# Patient Record
Sex: Male | Born: 1960 | Race: White | Hispanic: No | Marital: Single | Smoking: Former smoker
Health system: Southern US, Community
[De-identification: ages and names within clinical notes are randomized; demographics above are authoritative.]

## PROBLEM LIST (undated history)

## (undated) DIAGNOSIS — I2510 Atherosclerotic heart disease of native coronary artery without angina pectoris: Secondary | ICD-10-CM

## (undated) DIAGNOSIS — M1990 Unspecified osteoarthritis, unspecified site: Secondary | ICD-10-CM

## (undated) DIAGNOSIS — G4733 Obstructive sleep apnea (adult) (pediatric): Secondary | ICD-10-CM

## (undated) DIAGNOSIS — M549 Dorsalgia, unspecified: Secondary | ICD-10-CM

## (undated) DIAGNOSIS — E119 Type 2 diabetes mellitus without complications: Secondary | ICD-10-CM

## (undated) DIAGNOSIS — F419 Anxiety disorder, unspecified: Secondary | ICD-10-CM

## (undated) DIAGNOSIS — F329 Major depressive disorder, single episode, unspecified: Secondary | ICD-10-CM

## (undated) DIAGNOSIS — G4730 Sleep apnea, unspecified: Secondary | ICD-10-CM

## (undated) DIAGNOSIS — G8929 Other chronic pain: Secondary | ICD-10-CM

## (undated) DIAGNOSIS — I10 Essential (primary) hypertension: Secondary | ICD-10-CM

## (undated) DIAGNOSIS — R0600 Dyspnea, unspecified: Secondary | ICD-10-CM

## (undated) DIAGNOSIS — F1020 Alcohol dependence, uncomplicated: Secondary | ICD-10-CM

## (undated) DIAGNOSIS — Z9861 Coronary angioplasty status: Secondary | ICD-10-CM

## (undated) DIAGNOSIS — E785 Hyperlipidemia, unspecified: Secondary | ICD-10-CM

## (undated) DIAGNOSIS — J189 Pneumonia, unspecified organism: Secondary | ICD-10-CM

## (undated) DIAGNOSIS — I214 Non-ST elevation (NSTEMI) myocardial infarction: Secondary | ICD-10-CM

## (undated) DIAGNOSIS — J45909 Unspecified asthma, uncomplicated: Secondary | ICD-10-CM

## (undated) DIAGNOSIS — K219 Gastro-esophageal reflux disease without esophagitis: Secondary | ICD-10-CM

## (undated) DIAGNOSIS — J449 Chronic obstructive pulmonary disease, unspecified: Secondary | ICD-10-CM

## (undated) DIAGNOSIS — F32A Depression, unspecified: Secondary | ICD-10-CM

## (undated) DIAGNOSIS — Z9989 Dependence on other enabling machines and devices: Secondary | ICD-10-CM

## (undated) DIAGNOSIS — G43909 Migraine, unspecified, not intractable, without status migrainosus: Secondary | ICD-10-CM

## (undated) HISTORY — DX: Alcohol dependence, uncomplicated: F10.20

## (undated) HISTORY — DX: Anxiety disorder, unspecified: F41.9

## (undated) HISTORY — DX: Sleep apnea, unspecified: G47.30

## (undated) HISTORY — PX: MULTIPLE TOOTH EXTRACTIONS: SHX2053

## (undated) HISTORY — DX: Unspecified asthma, uncomplicated: J45.909

## (undated) HISTORY — PX: CORONARY ANGIOPLASTY WITH STENT PLACEMENT: SHX49

---

## 1997-12-19 ENCOUNTER — Encounter: Admission: RE | Admit: 1997-12-19 | Discharge: 1997-12-19 | Payer: Self-pay | Admitting: Family Medicine

## 1998-03-11 ENCOUNTER — Encounter: Admission: RE | Admit: 1998-03-11 | Discharge: 1998-03-11 | Payer: Self-pay | Admitting: Family Medicine

## 1998-06-19 ENCOUNTER — Encounter: Admission: RE | Admit: 1998-06-19 | Discharge: 1998-06-19 | Payer: Self-pay | Admitting: Family Medicine

## 1999-11-21 ENCOUNTER — Emergency Department (HOSPITAL_COMMUNITY): Admission: EM | Admit: 1999-11-21 | Discharge: 1999-11-21 | Payer: Self-pay | Admitting: Emergency Medicine

## 1999-11-21 ENCOUNTER — Encounter: Payer: Self-pay | Admitting: Emergency Medicine

## 1999-11-27 ENCOUNTER — Emergency Department (HOSPITAL_COMMUNITY): Admission: EM | Admit: 1999-11-27 | Discharge: 1999-11-27 | Payer: Self-pay | Admitting: Podiatry

## 1999-12-06 ENCOUNTER — Emergency Department (HOSPITAL_COMMUNITY): Admission: EM | Admit: 1999-12-06 | Discharge: 1999-12-06 | Payer: Self-pay | Admitting: Emergency Medicine

## 2007-11-23 ENCOUNTER — Emergency Department (HOSPITAL_COMMUNITY): Admission: EM | Admit: 2007-11-23 | Discharge: 2007-11-23 | Payer: Self-pay | Admitting: Emergency Medicine

## 2008-02-07 ENCOUNTER — Emergency Department (HOSPITAL_COMMUNITY): Admission: EM | Admit: 2008-02-07 | Discharge: 2008-02-08 | Payer: Self-pay | Admitting: Emergency Medicine

## 2008-03-12 ENCOUNTER — Emergency Department (HOSPITAL_COMMUNITY): Admission: EM | Admit: 2008-03-12 | Discharge: 2008-03-12 | Payer: Self-pay | Admitting: Emergency Medicine

## 2008-03-28 ENCOUNTER — Emergency Department (HOSPITAL_COMMUNITY): Admission: EM | Admit: 2008-03-28 | Discharge: 2008-03-28 | Payer: Self-pay | Admitting: Emergency Medicine

## 2008-06-26 ENCOUNTER — Ambulatory Visit: Payer: Self-pay | Admitting: Family Medicine

## 2008-07-03 ENCOUNTER — Ambulatory Visit: Payer: Self-pay | Admitting: *Deleted

## 2008-08-12 ENCOUNTER — Ambulatory Visit: Payer: Self-pay | Admitting: Internal Medicine

## 2008-08-12 LAB — CONVERTED CEMR LAB
ALT: 20 units/L (ref 0–53)
AST: 12 units/L (ref 0–37)
Albumin: 4.2 g/dL (ref 3.5–5.2)
Alkaline Phosphatase: 127 units/L — ABNORMAL HIGH (ref 39–117)
BUN: 12 mg/dL (ref 6–23)
Basophils Absolute: 0.1 10*3/uL (ref 0.0–0.1)
Basophils Relative: 1 % (ref 0–1)
CO2: 21 meq/L (ref 19–32)
Calcium: 9.8 mg/dL (ref 8.4–10.5)
Chloride: 99 meq/L (ref 96–112)
Cholesterol: 194 mg/dL (ref 0–200)
Creatinine, Ser: 0.77 mg/dL (ref 0.40–1.50)
Eosinophils Absolute: 0.3 10*3/uL (ref 0.0–0.7)
Eosinophils Relative: 2 % (ref 0–5)
Glucose, Bld: 287 mg/dL — ABNORMAL HIGH (ref 70–99)
HCT: 46.9 % (ref 39.0–52.0)
HDL: 39 mg/dL — ABNORMAL LOW (ref >39)
Hemoglobin: 16.1 g/dL (ref 13.0–17.0)
LDL Cholesterol: 112 mg/dL — ABNORMAL HIGH (ref 0–99)
Lymphocytes Relative: 24 % (ref 12–46)
Lymphs Abs: 3 10*3/uL (ref 0.7–4.0)
MCHC: 34.3 g/dL (ref 30.0–36.0)
MCV: 90 fL (ref 78.0–100.0)
Monocytes Absolute: 0.8 10*3/uL (ref 0.1–1.0)
Monocytes Relative: 6 % (ref 3–12)
Neutro Abs: 8.4 10*3/uL — ABNORMAL HIGH (ref 1.7–7.7)
Neutrophils Relative %: 67 % (ref 43–77)
Platelets: 325 10*3/uL (ref 150–400)
Potassium: 4.2 meq/L (ref 3.5–5.3)
Pro B Natriuretic peptide (BNP): 9 pg/mL (ref 0.0–100.0)
RBC: 5.21 M/uL (ref 4.22–5.81)
RDW: 12.5 % (ref 11.5–15.5)
Sodium: 135 meq/L (ref 135–145)
Total Bilirubin: 0.4 mg/dL (ref 0.3–1.2)
Total CHOL/HDL Ratio: 5
Total Protein: 7.2 g/dL (ref 6.0–8.3)
Triglycerides: 215 mg/dL — ABNORMAL HIGH (ref <150)
VLDL: 43 mg/dL — ABNORMAL HIGH (ref 0–40)
WBC: 12.5 10*3/uL — ABNORMAL HIGH (ref 4.0–10.5)

## 2008-08-20 ENCOUNTER — Emergency Department (HOSPITAL_COMMUNITY): Admission: EM | Admit: 2008-08-20 | Discharge: 2008-08-20 | Payer: Self-pay | Admitting: Emergency Medicine

## 2008-08-25 ENCOUNTER — Ambulatory Visit: Payer: Self-pay | Admitting: Internal Medicine

## 2008-08-28 ENCOUNTER — Ambulatory Visit: Payer: Self-pay | Admitting: Family Medicine

## 2008-11-03 ENCOUNTER — Ambulatory Visit: Payer: Self-pay | Admitting: Internal Medicine

## 2008-11-03 LAB — CONVERTED CEMR LAB
ALT: 16 units/L (ref 0–53)
AST: 13 units/L (ref 0–37)
Albumin: 4.2 g/dL (ref 3.5–5.2)
Alkaline Phosphatase: 112 units/L (ref 39–117)
BUN: 12 mg/dL (ref 6–23)
CO2: 19 meq/L (ref 19–32)
Calcium: 9.5 mg/dL (ref 8.4–10.5)
Chloride: 102 meq/L (ref 96–112)
Cholesterol: 165 mg/dL (ref 0–200)
Creatinine, Ser: 0.73 mg/dL (ref 0.40–1.50)
Glucose, Bld: 205 mg/dL — ABNORMAL HIGH (ref 70–99)
HDL: 39 mg/dL — ABNORMAL LOW (ref >39)
LDL Cholesterol: 80 mg/dL (ref 0–99)
Potassium: 4.3 meq/L (ref 3.5–5.3)
Sodium: 138 meq/L (ref 135–145)
Total Bilirubin: 0.2 mg/dL — ABNORMAL LOW (ref 0.3–1.2)
Total CHOL/HDL Ratio: 4.2
Total Protein: 7.2 g/dL (ref 6.0–8.3)
Triglycerides: 229 mg/dL — ABNORMAL HIGH (ref <150)
VLDL: 46 mg/dL — ABNORMAL HIGH (ref 0–40)

## 2008-11-28 ENCOUNTER — Ambulatory Visit: Payer: Self-pay | Admitting: Family Medicine

## 2008-12-04 ENCOUNTER — Ambulatory Visit: Payer: Self-pay | Admitting: Internal Medicine

## 2009-01-05 ENCOUNTER — Ambulatory Visit: Payer: Self-pay | Admitting: Internal Medicine

## 2009-01-31 ENCOUNTER — Ambulatory Visit (HOSPITAL_BASED_OUTPATIENT_CLINIC_OR_DEPARTMENT_OTHER): Admission: RE | Admit: 2009-01-31 | Discharge: 2009-01-31 | Payer: Self-pay | Admitting: Family Medicine

## 2009-02-06 ENCOUNTER — Ambulatory Visit: Payer: Self-pay | Admitting: Internal Medicine

## 2009-03-09 ENCOUNTER — Ambulatory Visit: Payer: Self-pay | Admitting: Internal Medicine

## 2009-03-20 ENCOUNTER — Ambulatory Visit: Payer: Self-pay | Admitting: Internal Medicine

## 2009-04-14 ENCOUNTER — Ambulatory Visit (HOSPITAL_BASED_OUTPATIENT_CLINIC_OR_DEPARTMENT_OTHER): Admission: RE | Admit: 2009-04-14 | Discharge: 2009-04-14 | Payer: Self-pay | Admitting: Family Medicine

## 2009-04-15 ENCOUNTER — Ambulatory Visit: Payer: Self-pay | Admitting: Internal Medicine

## 2009-04-18 ENCOUNTER — Ambulatory Visit: Payer: Self-pay | Admitting: Internal Medicine

## 2009-06-19 ENCOUNTER — Ambulatory Visit: Payer: Self-pay | Admitting: Internal Medicine

## 2009-12-18 ENCOUNTER — Ambulatory Visit: Payer: Self-pay | Admitting: Internal Medicine

## 2010-02-11 ENCOUNTER — Ambulatory Visit: Payer: Self-pay | Admitting: Internal Medicine

## 2010-03-22 ENCOUNTER — Ambulatory Visit: Payer: Self-pay | Admitting: Internal Medicine

## 2010-03-22 LAB — CONVERTED CEMR LAB
BUN: 15 mg/dL (ref 6–23)
CO2: 24 meq/L (ref 19–32)
Calcium: 9.7 mg/dL (ref 8.4–10.5)
Chloride: 99 meq/L (ref 96–112)
Cholesterol: 122 mg/dL (ref 0–200)
Creatinine, Ser: 0.97 mg/dL (ref 0.40–1.50)
Glucose, Bld: 301 mg/dL — ABNORMAL HIGH (ref 70–99)
HDL: 36 mg/dL — ABNORMAL LOW (ref >39)
Hgb A1c MFr Bld: 10.4 % — ABNORMAL HIGH (ref <5.7)
LDL Cholesterol: 56 mg/dL (ref 0–99)
Potassium: 4.4 meq/L (ref 3.5–5.3)
Sodium: 135 meq/L (ref 135–145)
Total CHOL/HDL Ratio: 3.4
Triglycerides: 150 mg/dL — ABNORMAL HIGH (ref <150)
VLDL: 30 mg/dL (ref 0–40)

## 2010-03-29 ENCOUNTER — Ambulatory Visit: Payer: Self-pay | Admitting: Family Medicine

## 2010-05-11 ENCOUNTER — Ambulatory Visit: Payer: Self-pay | Admitting: Internal Medicine

## 2010-05-11 LAB — CONVERTED CEMR LAB
BUN: 10 mg/dL (ref 6–23)
CO2: 24 meq/L (ref 19–32)
Calcium: 9.3 mg/dL (ref 8.4–10.5)
Chloride: 102 meq/L (ref 96–112)
Cholesterol: 127 mg/dL (ref 0–200)
Creatinine, Ser: 0.76 mg/dL (ref 0.40–1.50)
Glucose, Bld: 168 mg/dL — ABNORMAL HIGH (ref 70–99)
HDL: 38 mg/dL — ABNORMAL LOW (ref >39)
Hgb A1c MFr Bld: 9.4 % — ABNORMAL HIGH (ref <5.7)
LDL Cholesterol: 47 mg/dL (ref 0–99)
Potassium: 3.8 meq/L (ref 3.5–5.3)
Sodium: 138 meq/L (ref 135–145)
Total CHOL/HDL Ratio: 3.3
Triglycerides: 210 mg/dL — ABNORMAL HIGH (ref <150)
VLDL: 42 mg/dL — ABNORMAL HIGH (ref 0–40)

## 2010-05-14 ENCOUNTER — Ambulatory Visit: Payer: Self-pay | Admitting: Internal Medicine

## 2010-10-07 ENCOUNTER — Ambulatory Visit: Admit: 2010-10-07 | Payer: Self-pay | Admitting: Internal Medicine

## 2011-01-25 NOTE — Procedures (Signed)
NAME:  [name] [name], [name] [name]                 ACCOUNT NO.:  [id number]   MEDICAL RECORD NO.:  [id number]          PATIENT TYPE:  OUT   LOCATION:  SLEEP CENTER                 FACILITY:  [facility]   PHYSICIAN:  Clinton D. Maple Hudson, MD, FCCP, FACPDATE OF BIRTH:  August 12, 1961   DATE OF STUDY:  04/14/2009                            NOCTURNAL POLYSOMNOGRAM   REFERRING PHYSICIAN:  Sharin Grave, MD   INDICATION FOR STUDY:  Hypersomnia with sleep apnea.   EPWORTH SLEEPINESS SCORE:  Epworth sleepiness score 15/24, BMI 40.3.  Weight 265 pounds, height 68 inches.  Neck 19 inches.   MEDICATIONS:  Home medication charted and reviewed.   A diagnostic NPSG on Jan 31, 2009 recorded an AHI of 85.2 per hour.  CPAP titration is requested.   SLEEP ARCHITECTURE:  Total sleep time 168.5 minutes with sleep  efficiency 42%.  Stage I was 6.2%, stage II 57.3%, stage III 1.2%, REM  35.3% of total sleep time.  Sleep latency 129.5 minutes, REM latency  120.5 minutes, awake after sleep onset 103.5 minutes.  Arousal index  34.5.  Bedtime medication:  Metformin, lisinopril, aspirin.   RESPIRATORY DATA:  CPAP titration protocol.  CPAP was titrated to 16  CWP, AHI 0 per hour.  He wore a medium ResMed Quattro full face mask  with heated humidifier.   OXYGEN DATA:  Moderate snoring before CPAP with oxygen desaturation  controlled and mean oxygen saturation through the study 91.8% wearing  CPAP with room air.   CARDIAC DATA:  Sinus rhythm.   MOVEMENT-PARASOMNIA:  Occasional limb jerk with arousal, insignificant  recognizing the stimulation of CPAP titration.  Bathroom x1.   IMPRESSIONS-RECOMMENDATIONS:  1. Successful CPAP titration to 16 CWP, AHI 0 per hour.  He wore a      medium ResMed Quattro full face mask with heated humidifier.  2. Baseline diagnostic NPSG on Jan 31, 2009, AHI 85.2 per hour.  3. Sleep onset for the present study was shortly before 3:00 a.m.  He      may need a sedative hypnotic to aid with sleep  consolidation during      the first 2 weeks or so of CPAP adjustment at home.      Clinton D. Maple Hudson, MD, [facility], FACP  Diplomate, Biomedical engineer of Sleep Medicine  Electronically Signed     CDY/MEDQ  D:  04/18/2009 11:45:32  T:  04/18/2009 12:44:24  Job:  [id number]

## 2011-01-25 NOTE — Procedures (Signed)
NAME:  [name] [name], [name] [name]                 ACCOUNT NO.:  [id number]   MEDICAL RECORD NO.:  [id number]          PATIENT TYPE:  OUT   LOCATION:  SLEEP CENTER                 FACILITY[facility]   PHYSICIAN:  Clinton D. Maple Hudson, MD, FCCP, FACPDATE OF BIRTH:  [birth year]Aug-1961   DATE OF STUDY:  01/31/2009                            NOCTURNAL POLYSOMNOGRAM   REFERRING PHYSICIAN:  Sharin Grave, MD   INDICATION FOR STUDY:  Hypersomnia with sleep apnea.   EPWORTH SLEEPINESS SCORE:  13/24.  BMI 40.3.  Weight 265 pounds.  Height  68 inches.  Neck 21 inches.   MEDICATIONS:  Home medications are reportedly cholesterol and diabetes  medications, names not known to the patient.   SLEEP ARCHITECTURE:  Total sleep time 262 minutes with sleep efficiency  72%.  Stage I was 6.1%.  Stage II 65.6%.  Stage III absent.  REM 28.2%  of total sleep time.  Sleep latency 68 minutes.  REM latency 93 minutes.  Awake after sleep onset 35 minutes.  Arousal index 62.5 indicating  increased EEG arousal.  No bedtime medication was taken.  Sustained  sleep was not achieved until 11:45 p.m.   RESPIRATORY DATA:  Apnea/hypopnea index (AHI) 85.2 per hour.  A total of  372 events were scored including 77 obstructive apneas and 295  hypopneas.  Events were not positional.  REM AHI 76.2 per hour.  The  technician indicated the patient did not meet cumulative required sleep  time prior to 2:00 a.m. to permit CPAP titration by split protocol.   OXYGEN DATA:  Loud snoring with oxygen desaturation to a nadir of 65%.  Mean oxygen saturation through the study was 89.2% on room air.  A total  of 86.6 minutes was recorded with saturation less than 88% on the study.   CARDIAC DATA:  Sinus rhythm.   MOVEMENT-PARASOMNIA:  Insignificant limb jerks.  Bathroom x2.   IMPRESSIONS-RECOMMENDATIONS:  1. Severe obstructive sleep apnea/hypopnea syndrome, AHI 85.2 per hour      with nonpositional events.  Loud snoring and oxygen desaturation to      a  nadir of 65%.  2. The technician indicated insufficient time by protocol to permit      initiation of CPAP titration by split protocol on the study night.      Consider return for CPAP titration or evaluate for alternative      management as clinically indicated.  3. Note cumulative time with oxygen saturation less than 88% was 86.6      minutes on room air.  If CPAP therapy is not initiated, consider      evaluating for home oxygen during sleep.  Baseline room air      saturation was only 89% on arrival.  4. The patient's behavior was atypical.  He denied receiving      instructions and arrived initially at 5:45 p.m., although staff had      verified correct registration information and communication.      Clinton D. Maple Hudson, MD, FCCP, FACP  Diplomate, Biomedical engineer of Sleep Medicine  Electronically Signed     CDY/MEDQ  D:  02/06/2009 21:15:35  T:  02/07/2009  07:56:23  Job:  [id number]

## 2011-05-09 ENCOUNTER — Emergency Department (HOSPITAL_COMMUNITY)
Admission: EM | Admit: 2011-05-09 | Discharge: 2011-05-09 | Disposition: A | Discharge status: Home / Self Care | Payer: No Typology Code available for payment source | Attending: Emergency Medicine | Admitting: Emergency Medicine

## 2011-05-09 ENCOUNTER — Emergency Department (HOSPITAL_COMMUNITY): Payer: Self-pay

## 2011-05-09 DIAGNOSIS — S93409A Sprain of unspecified ligament of unspecified ankle, initial encounter: Secondary | ICD-10-CM | POA: Insufficient documentation

## 2011-05-09 DIAGNOSIS — IMO0002 Reserved for concepts with insufficient information to code with codable children: Secondary | ICD-10-CM | POA: Insufficient documentation

## 2011-05-09 DIAGNOSIS — Y92009 Unspecified place in unspecified non-institutional (private) residence as the place of occurrence of the external cause: Secondary | ICD-10-CM | POA: Insufficient documentation

## 2011-05-09 DIAGNOSIS — M79609 Pain in unspecified limb: Secondary | ICD-10-CM | POA: Insufficient documentation

## 2011-06-08 LAB — DIFFERENTIAL
Basophils Absolute: 0.1
Basophils Relative: 1
Eosinophils Absolute: 0.2
Eosinophils Relative: 2
Lymphs Abs: 4.9 — ABNORMAL HIGH
Neutrophils Relative %: 58

## 2011-06-08 LAB — CBC
HCT: 50.2
MCHC: 34.3
MCV: 91.1
Platelets: 330
RDW: 12.8

## 2011-06-08 LAB — POCT I-STAT, CHEM 8
BUN: 14
Calcium, Ion: 1.09 — ABNORMAL LOW
Chloride: 99
Creatinine, Ser: 0.8
Glucose, Bld: 300 — ABNORMAL HIGH
HCT: 52
Hemoglobin: 17.7 — ABNORMAL HIGH
Potassium: 4.4
Sodium: 131 — ABNORMAL LOW
TCO2: 25

## 2012-01-18 ENCOUNTER — Encounter (HOSPITAL_COMMUNITY): Payer: Self-pay | Admitting: Emergency Medicine

## 2012-01-18 ENCOUNTER — Inpatient Hospital Stay (HOSPITAL_COMMUNITY)
Admission: EM | Admit: 2012-01-18 | Discharge: 2012-01-21 | DRG: 248 | Disposition: A | Discharge status: Home / Self Care | Payer: No Typology Code available for payment source | Attending: Internal Medicine | Admitting: Internal Medicine

## 2012-01-18 ENCOUNTER — Emergency Department (HOSPITAL_COMMUNITY): Payer: Self-pay

## 2012-01-18 DIAGNOSIS — R739 Hyperglycemia, unspecified: Secondary | ICD-10-CM

## 2012-01-18 DIAGNOSIS — I10 Essential (primary) hypertension: Secondary | ICD-10-CM

## 2012-01-18 DIAGNOSIS — G4733 Obstructive sleep apnea (adult) (pediatric): Secondary | ICD-10-CM | POA: Diagnosis present

## 2012-01-18 DIAGNOSIS — Z794 Long term (current) use of insulin: Secondary | ICD-10-CM

## 2012-01-18 DIAGNOSIS — F17200 Nicotine dependence, unspecified, uncomplicated: Secondary | ICD-10-CM | POA: Diagnosis present

## 2012-01-18 DIAGNOSIS — R079 Chest pain, unspecified: Principal | ICD-10-CM | POA: Diagnosis present

## 2012-01-18 DIAGNOSIS — IMO0001 Reserved for inherently not codable concepts without codable children: Secondary | ICD-10-CM

## 2012-01-18 DIAGNOSIS — E1100 Type 2 diabetes mellitus with hyperosmolarity without nonketotic hyperglycemic-hyperosmolar coma (NKHHC): Secondary | ICD-10-CM | POA: Diagnosis present

## 2012-01-18 DIAGNOSIS — E1165 Type 2 diabetes mellitus with hyperglycemia: Secondary | ICD-10-CM

## 2012-01-18 DIAGNOSIS — E782 Mixed hyperlipidemia: Secondary | ICD-10-CM

## 2012-01-18 DIAGNOSIS — I2510 Atherosclerotic heart disease of native coronary artery without angina pectoris: Secondary | ICD-10-CM

## 2012-01-18 DIAGNOSIS — E785 Hyperlipidemia, unspecified: Secondary | ICD-10-CM | POA: Diagnosis present

## 2012-01-18 DIAGNOSIS — E871 Hypo-osmolality and hyponatremia: Secondary | ICD-10-CM | POA: Diagnosis present

## 2012-01-18 DIAGNOSIS — E1101 Type 2 diabetes mellitus with hyperosmolarity with coma: Secondary | ICD-10-CM | POA: Diagnosis present

## 2012-01-18 HISTORY — DX: Essential (primary) hypertension: I10

## 2012-01-18 HISTORY — DX: Hyperlipidemia, unspecified: E78.5

## 2012-01-18 HISTORY — DX: Dependence on other enabling machines and devices: Z99.89

## 2012-01-18 HISTORY — DX: Obstructive sleep apnea (adult) (pediatric): G47.33

## 2012-01-18 LAB — CARDIAC PANEL(CRET KIN+CKTOT+MB+TROPI)
Relative Index: INVALID (ref 0.0–2.5)
Total CK: 76 U/L (ref 7–232)
Troponin I: <0.3 ng/mL (ref <0.30)

## 2012-01-18 LAB — CBC
HCT: 46 % (ref 39.0–52.0)
Hemoglobin: 16.1 g/dL (ref 13.0–17.0)
MCH: 32.3 pg (ref 26.0–34.0)
Platelets: 255 10*3/uL (ref 150–400)
RBC: 5.39 MIL/uL (ref 4.22–5.81)
RBC: 5.2 MIL/uL (ref 4.22–5.81)
WBC: 10.3 10*3/uL (ref 4.0–10.5)

## 2012-01-18 LAB — HEPATIC FUNCTION PANEL
Albumin: 3.3 g/dL — ABNORMAL LOW (ref 3.5–5.2)
Total Bilirubin: 0.3 mg/dL (ref 0.3–1.2)
Total Protein: 6.9 g/dL (ref 6.0–8.3)

## 2012-01-18 LAB — BASIC METABOLIC PANEL
GFR calc Af Amer: >90 mL/min (ref >90)
GFR calc non Af Amer: >90 mL/min (ref >90)
Glucose, Bld: 486 mg/dL — ABNORMAL HIGH (ref 70–99)
Potassium: 4.4 mEq/L (ref 3.5–5.1)
Sodium: 130 mEq/L — ABNORMAL LOW (ref 135–145)

## 2012-01-18 LAB — DIFFERENTIAL
Basophils Relative: 1 % (ref 0–1)
Eosinophils Absolute: 0.2 10*3/uL (ref 0.0–0.7)
Lymphs Abs: 3.1 10*3/uL (ref 0.7–4.0)
Neutro Abs: 8.2 10*3/uL — ABNORMAL HIGH (ref 1.7–7.7)
Neutrophils Relative %: 67 % (ref 43–77)

## 2012-01-18 LAB — CREATININE, SERUM
GFR calc Af Amer: >90 mL/min (ref >90)
GFR calc non Af Amer: >90 mL/min (ref >90)

## 2012-01-18 LAB — PRO B NATRIURETIC PEPTIDE: Pro B Natriuretic peptide (BNP): 28.3 pg/mL (ref 0–125)

## 2012-01-18 LAB — GLUCOSE, CAPILLARY: Glucose-Capillary: 266 mg/dL — ABNORMAL HIGH (ref 70–99)

## 2012-01-18 LAB — POCT I-STAT TROPONIN I

## 2012-01-18 MED ORDER — LISINOPRIL 5 MG PO TABS
5.0000 mg | ORAL_TABLET | Freq: Every day | ORAL | Status: DC
  Administered 2012-01-18 – 2012-01-21 (×3): 5 mg via ORAL
  Filled 2012-01-18 (×4): qty 1

## 2012-01-18 MED ORDER — ONDANSETRON HCL 4 MG PO TABS: 4.0000 mg | ORAL_TABLET | Freq: Four times a day (QID) | ORAL | Status: DC | PRN

## 2012-01-18 MED ORDER — INSULIN ASPART 100 UNIT/ML ~~LOC~~ SOLN
0.0000 [IU] | Freq: Every day | SUBCUTANEOUS | Status: DC
  Administered 2012-01-18: 3 [IU] via SUBCUTANEOUS
  Administered 2012-01-20: 2 [IU] via SUBCUTANEOUS

## 2012-01-18 MED ORDER — ALUM & MAG HYDROXIDE-SIMETH 200-200-20 MG/5ML PO SUSP: 30.0000 mL | Freq: Four times a day (QID) | ORAL | Status: DC | PRN

## 2012-01-18 MED ORDER — METOPROLOL SUCCINATE 12.5 MG HALF TABLET
12.5000 mg | ORAL_TABLET | Freq: Every day | ORAL | Status: DC
  Administered 2012-01-18 – 2012-01-21 (×3): 12.5 mg via ORAL
  Filled 2012-01-18 (×4): qty 1

## 2012-01-18 MED ORDER — ASPIRIN 81 MG PO CHEW
324.0000 mg | CHEWABLE_TABLET | Freq: Once | ORAL | Status: AC
  Administered 2012-01-18: 324 mg via ORAL
  Filled 2012-01-18: qty 4

## 2012-01-18 MED ORDER — INSULIN ASPART PROT & ASPART (70-30 MIX) 100 UNIT/ML ~~LOC~~ SUSP
10.0000 [IU] | Freq: Once | SUBCUTANEOUS | Status: AC
  Administered 2012-01-18: 10 [IU] via SUBCUTANEOUS
  Filled 2012-01-18: qty 10

## 2012-01-18 MED ORDER — SODIUM CHLORIDE 0.9 % IJ SOLN: 3.0000 mL | Freq: Two times a day (BID) | INTRAMUSCULAR | Status: DC

## 2012-01-18 MED ORDER — ATORVASTATIN CALCIUM 40 MG PO TABS
40.0000 mg | ORAL_TABLET | Freq: Every day | ORAL | Status: DC
  Administered 2012-01-18 – 2012-01-21 (×3): 40 mg via ORAL
  Filled 2012-01-18 (×4): qty 1

## 2012-01-18 MED ORDER — INSULIN ASPART PROT & ASPART (70-30 MIX) 100 UNIT/ML ~~LOC~~ SUSP
10.0000 [IU] | Freq: Once | SUBCUTANEOUS | Status: DC
  Filled 2012-01-18: qty 3

## 2012-01-18 MED ORDER — MORPHINE SULFATE 4 MG/ML IJ SOLN: 4.0000 mg | INTRAMUSCULAR | Status: DC | PRN

## 2012-01-18 MED ORDER — ACETAMINOPHEN 325 MG PO TABS: 650.0000 mg | ORAL_TABLET | Freq: Four times a day (QID) | ORAL | Status: DC | PRN

## 2012-01-18 MED ORDER — SODIUM CHLORIDE 0.9 % IV SOLN
INTRAVENOUS | Status: DC
  Administered 2012-01-18: 100 mL/h via INTRAVENOUS
  Administered 2012-01-20: 03:00:00 via INTRAVENOUS

## 2012-01-18 MED ORDER — METFORMIN HCL 500 MG PO TABS
1000.0000 mg | ORAL_TABLET | Freq: Two times a day (BID) | ORAL | Status: DC
  Administered 2012-01-18 – 2012-01-19 (×3): 1000 mg via ORAL
  Filled 2012-01-18 (×6): qty 2

## 2012-01-18 MED ORDER — ONDANSETRON HCL 4 MG/2ML IJ SOLN: 4.0000 mg | Freq: Four times a day (QID) | INTRAMUSCULAR | Status: DC | PRN

## 2012-01-18 MED ORDER — INSULIN ASPART 100 UNIT/ML ~~LOC~~ SOLN
0.0000 [IU] | Freq: Three times a day (TID) | SUBCUTANEOUS | Status: DC
  Administered 2012-01-18: 15 [IU] via SUBCUTANEOUS
  Administered 2012-01-19 (×2): 11 [IU] via SUBCUTANEOUS
  Administered 2012-01-19 – 2012-01-20 (×3): 7 [IU] via SUBCUTANEOUS
  Administered 2012-01-21: 4 [IU] via SUBCUTANEOUS

## 2012-01-18 MED ORDER — SODIUM CHLORIDE 0.9 % IJ SOLN: 3.0000 mL | INTRAMUSCULAR | Status: DC | PRN

## 2012-01-18 MED ORDER — ENOXAPARIN SODIUM 40 MG/0.4ML ~~LOC~~ SOLN
40.0000 mg | SUBCUTANEOUS | Status: DC
  Administered 2012-01-18 – 2012-01-19 (×2): 40 mg via SUBCUTANEOUS
  Filled 2012-01-18 (×3): qty 0.4

## 2012-01-18 MED ORDER — ASPIRIN 81 MG PO CHEW
81.0000 mg | CHEWABLE_TABLET | Freq: Every day | ORAL | Status: DC
  Filled 2012-01-18: qty 1

## 2012-01-18 MED ORDER — PNEUMOCOCCAL VAC POLYVALENT 25 MCG/0.5ML IJ INJ
0.5000 mL | INJECTION | INTRAMUSCULAR | Status: AC
  Administered 2012-01-19: 0.5 mL via INTRAMUSCULAR
  Filled 2012-01-18: qty 0.5

## 2012-01-18 MED ORDER — INSULIN GLARGINE 100 UNIT/ML ~~LOC~~ SOLN
10.0000 [IU] | Freq: Every day | SUBCUTANEOUS | Status: DC
  Administered 2012-01-18: 10 [IU] via SUBCUTANEOUS

## 2012-01-18 MED ORDER — SODIUM CHLORIDE 0.9 % IV SOLN: 250.0000 mL | INTRAVENOUS | Status: DC | PRN

## 2012-01-18 MED ORDER — INSULIN ASPART 100 UNIT/ML ~~LOC~~ SOLN
6.0000 [IU] | Freq: Three times a day (TID) | SUBCUTANEOUS | Status: DC
  Administered 2012-01-18: 6 [IU] via SUBCUTANEOUS

## 2012-01-18 MED ORDER — ASPIRIN 81 MG PO CHEW
81.0000 mg | CHEWABLE_TABLET | Freq: Every day | ORAL | Status: DC
  Administered 2012-01-19: 81 mg via ORAL
  Filled 2012-01-18: qty 1

## 2012-01-18 MED ORDER — PIOGLITAZONE HCL 30 MG PO TABS
30.0000 mg | ORAL_TABLET | Freq: Every day | ORAL | Status: DC
  Administered 2012-01-18 – 2012-01-21 (×3): 30 mg via ORAL
  Filled 2012-01-18 (×4): qty 1

## 2012-01-18 MED ORDER — GLIPIZIDE 10 MG PO TABS
10.0000 mg | ORAL_TABLET | Freq: Every day | ORAL | Status: DC
  Administered 2012-01-18 – 2012-01-21 (×3): 10 mg via ORAL
  Filled 2012-01-18 (×4): qty 1

## 2012-01-18 MED ORDER — OXYCODONE HCL 5 MG PO TABS: 5.0000 mg | ORAL_TABLET | ORAL | Status: DC | PRN

## 2012-01-18 MED ORDER — SODIUM CHLORIDE 0.9 % IV BOLUS (SEPSIS)
1000.0000 mL | Freq: Once | INTRAVENOUS | Status: AC
  Administered 2012-01-18: 1000 mL via INTRAVENOUS

## 2012-01-18 MED ORDER — ACETAMINOPHEN 650 MG RE SUPP: 650.0000 mg | Freq: Four times a day (QID) | RECTAL | Status: DC | PRN

## 2012-01-18 MED ORDER — POLYETHYLENE GLYCOL 3350 17 G PO PACK
17.0000 g | PACK | Freq: Every day | ORAL | Status: DC | PRN
  Filled 2012-01-18: qty 1

## 2012-01-18 NOTE — ED Provider Notes (Signed)
History     CSN: [id number]  Arrival date & time 01/18/12  1219   First MD Initiated Contact with Patient 01/18/12 1325      Chief Complaint  Patient presents with  . Chest Pain    (Consider location/radiation/quality/duration/timing/severity/associated sxs/prior treatment) Patient is a 51 y.o. male presenting with chest pain. The history is provided by the patient.  Chest Pain The chest pain began 2 days ago. Chest pain occurs intermittently. The chest pain is resolved. At its most intense, the pain is at 6/10. The pain is currently at 6/10. The quality of the pain is described as pressure-like. The pain does not radiate. Primary symptoms include shortness of breath and dizziness. Pertinent negatives for primary symptoms include no fever, no syncope, no cough, no palpitations, no abdominal pain, no nausea and no vomiting.  Dizziness does not occur with nausea, vomiting, weakness or diaphoresis.   Pertinent negatives for associated symptoms include no diaphoresis, no orthopnea and no weakness. He tried nothing for the symptoms. Risk factors include obesity and male gender.  His past medical history is significant for diabetes and hypertension.     Past Medical History  Diagnosis Date  . Hypertension   . Hyperlipidemia     History reviewed. No pertinent past surgical history.  History reviewed. No pertinent family history.  History  Substance Use Topics  . Smoking status: Former Games developer  . Smokeless tobacco: Not on file  . Alcohol Use: No      Review of Systems  Constitutional: Negative for fever and diaphoresis.  Respiratory: Positive for shortness of breath. Negative for cough.   Cardiovascular: Positive for chest pain. Negative for palpitations, orthopnea and syncope.  Gastrointestinal: Negative for nausea, vomiting and abdominal pain.  Genitourinary: Negative for dysuria and flank pain.  Musculoskeletal: Negative.   Skin: Negative.   Neurological: Positive for  dizziness and light-headedness. Negative for syncope and weakness.    Allergies  Review of patient's allergies indicates not on file.  Home Medications   Current Outpatient Rx  Name Route Sig Dispense Refill  . ASPIRIN 81 MG PO CHEW Oral Chew 81 mg by mouth daily.    . ATORVASTATIN CALCIUM 40 MG PO TABS Oral Take 40 mg by mouth daily.    Marland Kitchen GLIPIZIDE 10 MG PO TABS Oral Take 10 mg by mouth daily.    Marland Kitchen LISINOPRIL 5 MG PO TABS Oral Take 5 mg by mouth daily.    Marland Kitchen METFORMIN HCL 1000 MG PO TABS Oral Take 1,000 mg by mouth 2 (two) times daily with a meal.    . PIOGLITAZONE HCL 30 MG PO TABS Oral Take 30 mg by mouth daily.      BP 136/73  Pulse 90  Temp(Src) 97.9 F (36.6 C) (Oral)  Resp 20  SpO2 96%  Physical Exam  Nursing note and vitals reviewed. Constitutional: He is oriented to person, place, and time. He appears well-developed and well-nourished.  HENT:  Head: Normocephalic.  Eyes: Conjunctivae are normal.  Neck: Neck supple.  Cardiovascular: Normal rate, regular rhythm and normal heart sounds.   Pulmonary/Chest: Effort normal. No respiratory distress. He has no wheezes. He has no rales.       Distant breath sounds  Abdominal: Soft. Bowel sounds are normal. He exhibits no distension. There is no tenderness.       obese  Musculoskeletal: He exhibits no edema.  Neurological: He is alert and oriented to person, place, and time.  Skin: Skin is warm and dry.  Psychiatric: He has a normal mood and affect.    ED Course  Procedures (including critical care time)   Pt with chest pain. Intermittent, sounds atypical. No prior cardiac workup. Will get labs, CXR, asa ordered. VS normal. CP free now   Date: 01/18/2012  Rate: 97  Rhythm: normal sinus rhythm  QRS Axis: normal  Intervals: normal  ST/T Wave abnormalities: normal  Conduction Disutrbances:none  Narrative Interpretation:   Old EKG Reviewed: unchanged  Results for orders placed during the hospital encounter of  01/18/12  CBC      Component Value Range   WBC 12.1 (*) 4.0 - 10.5 (K/uL)   RBC 5.39  4.22 - 5.81 (MIL/uL)   Hemoglobin 17.4 (*) 13.0 - 17.0 (g/dL)   HCT 16.1  09.6 - 04.5 (%)   MCV 87.9  78.0 - 100.0 (fL)   MCH 32.3  26.0 - 34.0 (pg)   MCHC 36.7 (*) 30.0 - 36.0 (g/dL)   RDW 40.9  81.1 - 91.4 (%)   Platelets 255  150 - 400 (K/uL)  DIFFERENTIAL      Component Value Range   Neutrophils Relative 67  43 - 77 (%)   Neutro Abs 8.2 (*) 1.7 - 7.7 (K/uL)   Lymphocytes Relative 26  12 - 46 (%)   Lymphs Abs 3.1  0.7 - 4.0 (K/uL)   Monocytes Relative 5  3 - 12 (%)   Monocytes Absolute 0.7  0.1 - 1.0 (K/uL)   Eosinophils Relative 2  0 - 5 (%)   Eosinophils Absolute 0.2  0.0 - 0.7 (K/uL)   Basophils Relative 1  0 - 1 (%)   Basophils Absolute 0.1  0.0 - 0.1 (K/uL)  BASIC METABOLIC PANEL      Component Value Range   Sodium 130 (*) 135 - 145 (mEq/L)   Potassium 4.4  3.5 - 5.1 (mEq/L)   Chloride 92 (*) 96 - 112 (mEq/L)   CO2 24  19 - 32 (mEq/L)   Glucose, Bld 486 (*) 70 - 99 (mg/dL)   BUN 14  6 - 23 (mg/dL)   Creatinine, Ser 7.82  0.50 - 1.35 (mg/dL)   Calcium 9.6  8.4 - 95.6 (mg/dL)   GFR calc non Af Amer >90  >90 (mL/min)   GFR calc Af Amer >90  >90 (mL/min)  POCT I-STAT TROPONIN I      Component Value Range   Troponin i, poc 0.01  0.00 - 0.08 (ng/mL)   Comment 3            Dg Chest 2 View  01/18/2012  *RADIOLOGY REPORT*  Clinical Data: Mid and upper chest pain.  Smoker.  CHEST - 2 VIEW  Comparison: None.  Findings: Cardiopericardial silhouette is mildly enlarged for projection.  No airspace disease.  No effusion.  Trachea midline. Mediastinal contours are within normal limits.  IMPRESSION: Mildly enlarged cardiopericardial silhouette appears similar to prior.  No failure or acute cardiopulmonary disease.  Original Report Authenticated By: Andreas Newport, M.D.    Pt CP free, risk factors include diabetes, HTN, former smoker, obesity, male, family hx of cardiac disease. Pt's glu is 480,  fluids started, insulin SQ ordered.  Will admit for further work up and r/o.  1. Chest pain   2. Hyperglycemia       MDM          Lottie Mussel, PA 01/18/12 1531

## 2012-01-18 NOTE — ED Provider Notes (Signed)
Medical screening examination/treatment/procedure(s) were performed by non-physician practitioner and as supervising physician I was immediately available for consultation/collaboration.  Ashya Nicolaisen L Johnnisha Forton, MD 01/18/12 2126 

## 2012-01-18 NOTE — H&P (Signed)
Hospital Admission Note Date: 01/18/2012  Patient name: [name] [name] Medical record number: [id number] Date of birth: 22-Apr-1961 Age: 51 y.o. Gender: male PCP: Georganna Skeans, MD, MD  Attending physician: Maryruth Bun Alexes Menchaca, MD Emergency Contact: Edwena Felty [phone]. Sister Garrison Columbus [phone], Mother Garrison Columbus [phone] (step-father) [phone]. Code Status: Full  Chief Complaint: "Pains in my heart".  History of Present Illness: [name] [name] is an 51 y.o. male with a PMH of HTN and hyperlipidemia who presents with a 2 day history of chest pain.  The patient initially thought he was coming down with the flu and took some Mucinex with no relief.  Pain is in the left anterior chest, is intermittent, with no specific aggravating or alleviating factors, although he notes that when he was walking it seemed worse.  He reports some diaphoresis with the pain, no nausea or vomiting, and radiation of pain up into jaw with weakness of the right arm.  Pain in jaw is sharp in quality.  Pain in chest is "sharp and dull", rated a 10/10 at worst, but currently chest pain free.  No history of prior cardiac work up.  The patient states he ran out of his DM medications about 1 month ago.  He has multiple cardiac risk factors including: Male sex, history of HTN, dyslipidemia, uncontrolled DM, obesity and + family history of early heart disease with both his father and a brother dying of heart attack in their early 85's.  Past Medical History Past Medical History  Diagnosis Date  . Hypertension   . Hyperlipidemia   . DM (diabetes mellitus)     Past Surgical History Past Surgical History  Procedure Date  . No past surgeries     Meds: Prior to Admission medications   Medication Sig Start Date End Date Taking? Authorizing Provider  aspirin 81 MG chewable tablet Chew 81 mg by mouth daily.   Yes Historical Provider, MD  atorvastatin (LIPITOR) 40 MG tablet Take 40 mg by mouth daily.   Yes Historical  Provider, MD  glipiZIDE (GLUCOTROL) 10 MG tablet Take 10 mg by mouth daily.   Yes Historical Provider, MD  lisinopril (PRINIVIL,ZESTRIL) 5 MG tablet Take 5 mg by mouth daily.   Yes Historical Provider, MD  metFORMIN (GLUCOPHAGE) 1000 MG tablet Take 1,000 mg by mouth 2 (two) times daily with a meal.   Yes Historical Provider, MD  pioglitazone (ACTOS) 30 MG tablet Take 30 mg by mouth daily.   Yes Historical Provider, MD    Allergies: Review of patient's allergies indicates not on file.  Social History: History   Social History  . Marital Status: Single    Spouse Name: N/A    Number of Children: 0  . Years of Education: 15   Occupational History  . Unemployed   . Student, computer tech [employer]    Social History Main Topics  . Smoking status: Former Smoker -- 1.0 packs/day for 35 years    Quit date: 01/15/2012  . Smokeless tobacco: Never Used  . Alcohol Use: Yes     12 beers twice a week.  . Drug Use: No  . Sexually Active: Not on file   Other Topics Concern  . Not on file   Social History Narrative   Single.  Lives with a roommate.  Ambulates independently.    Family History:  Family History  Problem Relation Age of Onset  . Heart attack Father   . Heart attack Brother     Review of Systems: Constitutional: No  fever, +chills;  Appetite normal; No weight loss, + weight gain.  HEENT: No blurry vision or diplopia, wears glasses; no pharyngitis or dysphagia CV: +chest pain, no arrhythmia.  Resp: +SOB, +cough. GI: No N/V, no diarrhea, no melena or hematochezia.  GU: +dysuria, no hematuria.  MSK: + myalgias/arthralgias.  Neuro:  + headache, no focal neurological deficits or seizures.  Psych: No depression or anxiety.  Endo: No thyroid disease, + DM.  Skin: No rashes or lesions.  Heme: No anemia or blood dyscrasia   Physical Exam: Blood pressure 136/73, pulse 90, temperature 97.9 F (36.6 C), temperature source Oral, resp. rate 20, SpO2 96.00%. BP 136/73  Pulse 90   Temp(Src) 97.9 F (36.6 C) (Oral)  Resp 20  SpO2 96%  General Appearance:    Alert, obese, cooperative, no distress, appears stated age  Head:    Normocephalic, without obvious abnormality, atraumatic  Eyes:    PERRL, conjunctiva/corneas clear, EOM's intact.      Ears:    Normal external ear canals, both ears  Nose:   Nares normal, septum midline, mucosa normal, no drainage    or sinus tenderness  Throat:   Lips, mucosa, and tongue normal; teeth and gums normal  Neck:   Supple, symmetrical, trachea midline, no adenopathy;       thyroid:  No enlargement/tenderness/nodules; no carotid   bruit or JVD  Back:     Symmetric, no curvature, ROM normal, no CVA tenderness  Lungs:     Clear to auscultation bilaterally, respirations unlabored  Chest wall:    No tenderness or deformity  Heart:    Regular rate and rhythm, S1 and S2 normal, no murmur, rub   or gallop  Abdomen:     Soft, non-tender, bowel sounds active all four quadrants,    no masses, no organomegaly  Extremities:   Extremities normal, atraumatic, no cyanosis or edema  Pulses:   2+ and symmetric all extremities  Skin:   Skin color, texture, turgor normal, no rashes or lesions  Lymph nodes:   Cervical, supraclavicular, and axillary nodes normal  Neurologic:   CNII-XII intact. Non-focal   Lab results: Basic Metabolic Panel:  Lab 01/18/12 1610  NA 130*  K 4.4  CL 92*  CO2 24  GLUCOSE 486*  BUN 14  CREATININE 0.50  CALCIUM 9.6  MG --  PHOS --   GFR CrCl is unknown because there is no height on file for the current visit.  CBC:  Lab 01/18/12 1250  WBC 12.1*  NEUTROABS 8.2*  HGB 17.4*  HCT 47.4  MCV 87.9  PLT 255    Imaging results:  Dg Chest 2 View  01/18/2012  *RADIOLOGY REPORT*  Clinical Data: Mid and upper chest pain.  Smoker.  CHEST - 2 VIEW  Comparison: None.  Findings: Cardiopericardial silhouette is mildly enlarged for projection.  No airspace disease.  No effusion.  Trachea midline. Mediastinal contours  are within normal limits.  IMPRESSION: Mildly enlarged cardiopericardial silhouette appears similar to prior.  No failure or acute cardiopulmonary disease.  Original Report Authenticated By: Andreas Newport, M.D.    Assessment & Plan: Principal Problem:  *Chest pain  Multiple cardiac risk factors.  Will admit to telemetry, cycle cardiac markers, continue ASA, statin.  Start beta blocker.  Cardiology consult for further evaluation with stress test or cardiac catheterization.  Check TSH. Active Problems:  Hyponatremia  Likely pseudohyponatremia from markedly elevated blood glucoses.  Hydrate and monitor.  DM hyperosmolarity type II, uncontrolled  Resume  oral hypoglycemics.  Start Lantus 10 units SQ Q HS, SSI, insulin resistant scale with 6 units of Novalog Q AC.  DM coordinator and dietician consultation.  HTN (hypertension)  Resume home RX with Lisinopril.  Add beta blocker.  Hyperlipidemia  Check FLP in a.m.  Continue Lipitor.  Prophylaxis: Lovenox for DVT prophylaxis.  Time Spent On Admission: 1 hour.  Issaiah Seabrooks 01/18/2012, 4:01 PM Pager [phone]

## 2012-01-18 NOTE — Progress Notes (Signed)
ED CM spoke with [name] [name] in [facility] who confirms pt is eligible for [facility] indigent program Explained to pt [facility] indigent 3 day assistance program.  Voiced understanding Pt attending [employer] and uses public transportation services CM offered pharmacare at [phone] as a resource Pt voiced understanding and appreciation of services offered

## 2012-01-18 NOTE — Progress Notes (Signed)
ED CM spoke with pt after noting CM consult.  Pt confirms he is active with [facility] and seen Dr Andrey Campanile Reports his next appointment is in 3 months Reports [facility] medications at [facility] previously was free but now he is being charged for medications Reports not work since 2008 CM reviewed and provided pt with written information for DSS medication assistance program, discounted pharmacies for $4 medications, [email or web address], [facility] $4 generic med list and other self pay pcp alternatives (for appointments earlier than [facility]). Pt stating he has access to internet but prefers to go to DSS for "face to face" encounter with DSS staff.  Spoke with Elease Hashimoto at [facility] [phone] who confirms pt has not been seen since 06/01/2011 and does not have an appointment with Dr Andrey Campanile scheduled in 3 months

## 2012-01-18 NOTE — Consult Note (Signed)
Admit date: 01/18/2012 Referring Physician  : Dr. Darnelle Catalan Primary Physician Georganna Skeans, MD, MD Primary Cardiologist  None Reason for Consultation  : Evaluation of chest pain  HPI: 51 year old male with uncontrolled diabetes, hyperlipidemia, hypertension, morbid obesity, tobacco use, father who died age 19 from myocardial infarction here with intermittent chest discomfort over the past 2-3 days. On "Sunday, he noted central chest burning/sharpness with occasional radiation to his left neck, sharp pain that seemed to occur at rest. When walking, he's been more short of breath recently. For instance, when he was coming into the emergency room from the bus stop, he have to stop to catch his breath. He states that he quit smoking 3 days ago. Walking may have triggered some chest discomfort but it is hard for him to tell. It does not seem to be aggravated by eating.   Here in the emergency room he was found to have a glucose close to 500, mild leukocytosis, hyponatremia. His EKG, personally reviewed, shows no ST segment changes.  He admits that recently he has not had his medications. He states that he has been laid off from [employer]. Currently he is chest pain-free.  PMH:   Past Medical History  Diagnosis Date  . Hypertension   . Hyperlipidemia   . DM (diabetes mellitus)     PSH:   Past Surgical History  Procedure Date  . No past surgeries    Allergies:  Review of patient's allergies indicates not on file. Prior to Admit Meds:   Prior to Admission medications   Medication  Sig  Start Date  End Date  Taking?  Authorizing Provider   aspirin 81 MG chewable tablet  Chew 81 mg by mouth daily.    Yes  Historical Provider, MD   atorvastatin (LIPITOR) 40 MG tablet  Take 40 mg by mouth daily.    Yes  Historical Provider, MD   glipiZIDE (GLUCOTROL) 10 MG tablet  Take 10 mg by mouth daily.    Yes  Historical Provider, MD   lisinopril (PRINIVIL,ZESTRIL) 5 MG tablet  Take 5 mg by mouth daily.    Yes   Historical Provider, MD   metFORMIN (GLUCOPHAGE) 1000 MG tablet  Take 1,000 mg by mouth 2 (two) times daily with a meal.    Yes  Historical Provider, MD   pioglitazone (ACTOS) 30 MG tablet  Take 30 mg by mouth daily.    Yes  Historical Provider, MD       Fam HX:    Family History  Problem Relation Age of Onset  . Heart attack Father   . Heart attack Brother    Social HX:    History   Social History  . Marital Status: Single    Spouse Name: N/A    Number of Children: 0  . Years of Education: 15   Occupational History  . Unemployed   . Student, computer tech [employer]    Social History Main Topics  . Smoking status: Former Smoker -- 1.0 packs/day for 35 years    Quit date: 01/15/2012  . Smokeless tobacco: Never Used  . Alcohol Use: Yes     12"  beers twice a week.  . Drug Use: No  . Sexually Active: Not on file   Other Topics Concern  . Not on file   Social History Narrative   Single.  Lives with a roommate.  Ambulates independently.     ROS:  Denies any recent fevers. He has had a cough very  he is tried Mucinex. He has dryness of his mouth for which he takes soda at night. Shortness of breath. All 11 ROS were addressed and are negative except what is stated in the HPI  Physical Exam: Blood pressure 128/87, pulse 83, temperature 98 F (36.7 C), temperature source Oral, resp. rate 20, SpO2 96.00%.    General: Well developed, well nourished, in no acute distress Head: Eyes PERRLA, No xanthomas.   Normal cephalic and atramatic  Lungs:   Clear bilaterally to auscultation and percussion. Normal respiratory effort. No wheezes, no rales. Heart:   HRRR S1 S2 Pulses are 2+ & equal. Distant heart sounds, no murmurs            No carotid bruit. No JVD.  No abdominal bruits. No femoral bruits. Abdomen: Bowel sounds are positive, abdomen soft and non-tender without masses. Obese Msk:  Back normal, normal gait. Normal strength and tone for age. Extremities:   No clubbing,  cyanosis or edema.  DP +1 Neuro: Alert and oriented X 3, non-focal, MAE x 4 GU: Deferred Rectal: Deferred Psych:  Good affect, responds appropriately    Labs:   Lab Results  Component Value Date   WBC 12.1* 01/18/2012   HGB 17.4* 01/18/2012   HCT 47.4 01/18/2012   MCV 87.9 01/18/2012   PLT 255 01/18/2012    Lab 01/18/12 1250  NA 130*  K 4.4  CL 92*  CO2 24  BUN 14  CREATININE 0.50  CALCIUM 9.6  PROT --  BILITOT --  ALKPHOS --  ALT --  AST --  GLUCOSE 486*   No results found for this basename: PTT   No results found for this basename: INR, PROTIME   No results found for this basename: CKTOTAL, CKMB, CKMBINDEX, TROPONINI     Lab Results  Component Value Date   CHOL 127 05/11/2010   CHOL 122 03/22/2010   CHOL 165 11/03/2008   Lab Results  Component Value Date   HDL 38* 05/11/2010   HDL 36* 03/22/2010   HDL 39* 11/03/2008   Lab Results  Component Value Date   LDLCALC 47 05/11/2010   LDLCALC 56 03/22/2010   LDLCALC 80 11/03/2008   Lab Results  Component Value Date   TRIG 210* 05/11/2010   TRIG 150* 03/22/2010   TRIG 229* 11/03/2008   Lab Results  Component Value Date   CHOLHDL 3.3 Ratio 05/11/2010   CHOLHDL 3.4 Ratio 03/22/2010   CHOLHDL 4.2 Ratio 11/03/2008   No results found for this basename: LDLDIRECT      Radiology:  Dg Chest 2 View  01/18/2012  *RADIOLOGY REPORT*  Clinical Data: Mid and upper chest pain.  Smoker.  CHEST - 2 VIEW  Comparison: None.  Findings: Cardiopericardial silhouette is mildly enlarged for projection.  No airspace disease.  No effusion.  Trachea midline. Mediastinal contours are within normal limits.  IMPRESSION: Mildly enlarged cardiopericardial silhouette appears similar to prior.  No failure or acute cardiopulmonary disease.  Original Report Authenticated By: Andreas Newport, M.D.   Personally viewed.  EKG:  Sinus rhythm, no ST segment changes, heart rate 97. Personally viewed.   ASSESSMENT/PLAN:   51 year old male with diabetes, tobacco  use, hypertension, hyperlipidemia, morbid obesity with chest discomfort, hyponatremia, hyperglycemia.  Chest pain  - He has every risk factor for coronary artery disease and certainly warrants further risk stratification. He has both typical as well as atypical features to his chest discomfort. Sharpness in one of the atypical features. So far, troponin is  negative. Chest x-ray is normal. EKG is unremarkable. Nonetheless, I would like to perform nuclear stress test on him tomorrow as long as his electrolyte abnormalities/glucose is under better control. I will go ahead and order the tests in anticipation of improvement in metabolic arrangement. If stress test is abnormal, we will pursue cardiac catheterization. His shortness of breath may be secondary to ischemia but he also has been a long-term smoker with morbid obesity as well.  Tobacco use  - Congratulated him on tobacco cessation for the past 3 days. Encourage further cessation.  Morbid obesity  - Encouraged him to watch his diet, decrease carbohydrates, increase exercise if safe from a cardiovascular perspective/stress test.  Strong family history of near disease-father with myocardial infarction at age 17  Diabetes/hyperglycemia/hyponatremia-per primary team.  Donato Schultz, MD  01/18/2012  4:44 PM

## 2012-01-18 NOTE — ED Notes (Signed)
To ED by private vehicle with c/o chest pain- midsternal, radiating to side of neck. Pain intermittently

## 2012-01-19 ENCOUNTER — Encounter (HOSPITAL_COMMUNITY): Payer: Self-pay

## 2012-01-19 ENCOUNTER — Encounter (HOSPITAL_COMMUNITY): Payer: Self-pay | Admitting: Internal Medicine

## 2012-01-19 DIAGNOSIS — Z9989 Dependence on other enabling machines and devices: Secondary | ICD-10-CM | POA: Diagnosis present

## 2012-01-19 DIAGNOSIS — I10 Essential (primary) hypertension: Secondary | ICD-10-CM

## 2012-01-19 DIAGNOSIS — E1165 Type 2 diabetes mellitus with hyperglycemia: Secondary | ICD-10-CM

## 2012-01-19 DIAGNOSIS — E782 Mixed hyperlipidemia: Secondary | ICD-10-CM

## 2012-01-19 DIAGNOSIS — G4733 Obstructive sleep apnea (adult) (pediatric): Secondary | ICD-10-CM

## 2012-01-19 DIAGNOSIS — R079 Chest pain, unspecified: Secondary | ICD-10-CM

## 2012-01-19 HISTORY — DX: Obstructive sleep apnea (adult) (pediatric): G47.33

## 2012-01-19 LAB — TSH: TSH: 1.1 u[IU]/mL (ref 0.350–4.500)

## 2012-01-19 LAB — GLUCOSE, CAPILLARY: Glucose-Capillary: 186 mg/dL — ABNORMAL HIGH (ref 70–99)

## 2012-01-19 LAB — CARDIAC PANEL(CRET KIN+CKTOT+MB+TROPI)
Relative Index: INVALID (ref 0.0–2.5)
Relative Index: INVALID (ref 0.0–2.5)
Total CK: 91 U/L (ref 7–232)

## 2012-01-19 LAB — LIPID PANEL
Cholesterol: 171 mg/dL (ref 0–200)
Triglycerides: 312 mg/dL — ABNORMAL HIGH (ref <150)

## 2012-01-19 MED ORDER — INSULIN GLARGINE 100 UNIT/ML ~~LOC~~ SOLN: 20.0000 [IU] | Freq: Every day | SUBCUTANEOUS | Status: DC

## 2012-01-19 MED ORDER — ASPIRIN 81 MG PO CHEW
81.0000 mg | CHEWABLE_TABLET | Freq: Every day | ORAL | Status: DC
  Administered 2012-01-21: 81 mg via ORAL
  Filled 2012-01-19 (×2): qty 1

## 2012-01-19 MED ORDER — SODIUM CHLORIDE 0.9 % IJ SOLN: 3.0000 mL | INTRAMUSCULAR | Status: DC | PRN

## 2012-01-19 MED ORDER — TECHNETIUM TC 99M TETROFOSMIN IV KIT
30.0000 | PACK | Freq: Once | INTRAVENOUS | Status: AC | PRN
  Administered 2012-01-19: 30 via INTRAVENOUS

## 2012-01-19 MED ORDER — OMEGA-3-ACID ETHYL ESTERS 1 G PO CAPS
1.0000 g | ORAL_CAPSULE | Freq: Two times a day (BID) | ORAL | Status: DC
  Administered 2012-01-19 – 2012-01-21 (×4): 1 g via ORAL
  Filled 2012-01-19 (×7): qty 1

## 2012-01-19 MED ORDER — REGADENOSON 0.4 MG/5ML IV SOLN
0.4000 mg | Freq: Once | INTRAVENOUS | Status: AC
  Administered 2012-01-19: 0.4 mg via INTRAVENOUS
  Filled 2012-01-19: qty 5

## 2012-01-19 MED ORDER — LIVING WELL WITH DIABETES BOOK
Freq: Once | Status: AC
  Administered 2012-01-19: 1
  Filled 2012-01-19: qty 1

## 2012-01-19 MED ORDER — SODIUM CHLORIDE 0.9 % IJ SOLN
3.0000 mL | Freq: Two times a day (BID) | INTRAMUSCULAR | Status: DC
  Administered 2012-01-19: 3 mL via INTRAVENOUS

## 2012-01-19 MED ORDER — TECHNETIUM TC 99M TETROFOSMIN IV KIT
10.0000 | PACK | Freq: Once | INTRAVENOUS | Status: AC | PRN
  Administered 2012-01-19: 10 via INTRAVENOUS

## 2012-01-19 MED ORDER — SODIUM CHLORIDE 0.9 % IV SOLN: 250.0000 mL | INTRAVENOUS | Status: DC | PRN

## 2012-01-19 MED ORDER — INSULIN GLARGINE 100 UNIT/ML ~~LOC~~ SOLN
30.0000 [IU] | Freq: Every day | SUBCUTANEOUS | Status: DC
  Administered 2012-01-19 – 2012-01-20 (×2): 30 [IU] via SUBCUTANEOUS

## 2012-01-19 MED ORDER — INSULIN ASPART 100 UNIT/ML ~~LOC~~ SOLN
8.0000 [IU] | Freq: Three times a day (TID) | SUBCUTANEOUS | Status: DC
  Administered 2012-01-19 – 2012-01-21 (×2): 8 [IU] via SUBCUTANEOUS

## 2012-01-19 MED ORDER — SODIUM CHLORIDE 0.9 % IV SOLN: INTRAVENOUS | Status: DC

## 2012-01-19 MED ORDER — BD GETTING STARTED TAKE HOME KIT: 1/2ML X 30G SYRINGES
1.0000 | Freq: Once | Status: AC
  Administered 2012-01-19: 1
  Filled 2012-01-19: qty 1

## 2012-01-19 MED ORDER — ASPIRIN 81 MG PO CHEW
324.0000 mg | CHEWABLE_TABLET | ORAL | Status: AC
  Administered 2012-01-20: 324 mg via ORAL
  Filled 2012-01-19: qty 4

## 2012-01-19 NOTE — Progress Notes (Signed)
Spoke with pt's mother and sister. Both are concerned that pt is not living in a very safe situation. Per pt's sister, pt has been previously diagnosed as having "mild retardation". Pt does exhibit child-like behaviors and has a speech impediment. Pt's sister also noted that the pt hasn't been taking care of himself well on his own and informed me that she doesn't think he is capable of doing so. Prior to his brother's death, the brother had been taking care of the pt. Is there any way the patient could be considered for a possible group home placement or perhaps home health services? Thanks.

## 2012-01-19 NOTE — Progress Notes (Signed)
NUC stress was abnormal with inferior ischemia. EF 49%. Personally viewed. Discussed cardiac cath with him and mother on phone. Her work number tomorrow is [phone]. Her home number is [phone].  Discussed risks of stroke MI death, bleeding.  Will also supply with CPAP tonight.   Will set up cath at [facility] tomorrow. NPO after midnight.   Note: 2+ radial

## 2012-01-19 NOTE — Progress Notes (Signed)
Patient qualifies for the indigent fund (3 day medication supply) if needed - Attending please write 2 scripts (the medication for the indigent funds goes to pharmacy and the other goes with the patient at discharge). Abelino Derrick RN, BSN,MHA.

## 2012-01-19 NOTE — Plan of Care (Signed)
Problem: Food- and Nutrition-Related Knowledge Deficit (NB-1.1) Goal: Nutrition education Formal process to instruct or train a patient/client in a skill or to impart knowledge to help patients/clients voluntarily manage or modify food choices and eating behavior to maintain or improve health.  Outcome: Completed/Met Date Met:  01/19/12 Knowledge deficit resolved 5/9 with diet education.   Comments:  Discussed and provided handout obtained from ADA nutrition care manual for type 2 diabetes nutrition therapy. Patient reported he has not previously been checking his blood sugar or counting carbohydrates, but he stated he is going to start to do these things. Patient expressed motivation to make healthy food choices. I have instructed the patient on how to read food labels. He asked good and is without further nutrition realted questions at this time. He verbalized understanding of the nutrition information we discussed today. Expect fair compliance.   RD available for nutrition needs.  [name] [name] [facility] [phone]

## 2012-01-19 NOTE — Progress Notes (Signed)
Inpatient Diabetes Program  Dr. Darnelle Catalan paged and asked if I could see pt, as he had questions.  Pt asked if he could have fried foods.  Discussed importance of limiting fried foods in diet and gave other dietary options to replace the fried foods in his diet.  Discussed how it's what you do every day that makes a difference in your health, not just once in a while. Verbalized understanding.  Ailene Ards, RD, LDN, CDE Inpatient Diabetes Coordinator

## 2012-01-19 NOTE — Progress Notes (Signed)
Inpatient Diabetes Program Recommendations  AACE/ADA: New Consensus Statement on Inpatient Glycemic Control (2009)  Target Ranges:  Prepandial:   less than 140 mg/dL      Peak postprandial:   less than 180 mg/dL (1-2 hours)      Critically ill patients:  140 - 180 mg/dL   Reason for Visit: Hyperglycemia and consult for recommendations  Inpatient Diabetes Program Recommendations Insulin - Basal: Would increase to 30 units.  At 0.2 units/kg, pt would require 40 units daily or HS  Note: Have ordered staff RN's to assist pt with watching DM videos, ordered insulin starter kit, added carb modified to diet orders.  Noted addition of meal coverage, thank you. Thank you, Lenor Coffin, RN, CNS, Diabetes Coordinator [phone])

## 2012-01-19 NOTE — Progress Notes (Signed)
Subjective:  This morning he states that he had some chest burning once again, nonexertional while halfway sitting up in bed. His troponins are normal.  Objective:  Vital Signs in the last 24 hours: Temp:  [97.8 F (36.6 C)-98.2 F (36.8 C)] 98.2 F (36.8 C) (05/09 0602) Pulse Rate:  [65-99] 65  (05/09 0602) Resp:  [17-20] 18  (05/09 0602) BP: (98-150)/(56-95) 98/56 mmHg (05/09 0602) SpO2:  [91 %-97 %] 97 % (05/09 0602) Weight:  [116.075 kg (255 lb 14.4 oz)] 116.075 kg (255 lb 14.4 oz) (05/08 1737)  Intake/Output from previous day: 05/08 0701 - 05/09 0700 In: 1333.3 [P.O.:240; I.V.:1093.3] Out: -    Physical Exam: General: Well developed, well nourished, in no acute distress. Head:  Normocephalic and atraumatic. Lungs: Clear to auscultation and percussion. Heart: Normal S1 and S2.  No murmur, rubs or gallops.  Pulses: Pulses normal in all 4 extremities. Abdomen: soft, non-tender, positive bowel sounds. Obese Extremities: No clubbing or cyanosis. No edema. Neurologic: Alert and oriented x 3.     Lab Results:  Basename 01/18/12 1953 01/18/12 1250  WBC 10.3 12.1*  HGB 16.1 17.4*  PLT 255 255    Basename 01/18/12 1953 01/18/12 1250  NA -- 130*  K -- 4.4  CL -- 92*  CO2 -- 24  GLUCOSE -- 486*  BUN -- 14  CREATININE 0.63 0.50    Basename 01/19/12 0336 01/18/12 1955  TROPONINI <0.30 <0.30   Hepatic Function Panel  Basename 01/18/12 1953  PROT 6.9  ALBUMIN 3.3*  AST 20  ALT 25  ALKPHOS 133*  BILITOT 0.3  BILIDIR <0.1  IBILI NOT CALCULATED    Basename 01/19/12 0336  CHOL 171   No results found for this basename: PROTIME in the last 72 hours  Imaging: Dg Chest 2 View  01/18/2012  *RADIOLOGY REPORT*  Clinical Data: Mid and upper chest pain.  Smoker.  CHEST - 2 VIEW  Comparison: None.  Findings: Cardiopericardial silhouette is mildly enlarged for projection.  No airspace disease.  No effusion.  Trachea midline. Mediastinal contours are within normal limits.   IMPRESSION: Mildly enlarged cardiopericardial silhouette appears similar to prior.  No failure or acute cardiopulmonary disease.  Original Report Authenticated By: Andreas Newport, M.D.   Personally viewed.   Telemetry: No VT, NSR Personally viewed.   Assessment/Plan:  Principal Problem:  *Chest pain Active Problems:  Hyponatremia  DM hyperosmolarity type II, uncontrolled  HTN (hypertension)  Hyperlipidemia   -Await nuc stress test.   - Continue to treat DM, HTN, HL  - Encourage weight loss  - Read note from social work-?group home  - Replete KCL.  - LDL 84, Trig 312 (DM,obesity) - Continue medication.   Nelani Schmelzle 01/19/2012, 9:01 AM

## 2012-01-19 NOTE — Progress Notes (Signed)
PROGRESS NOTE  [name] [name] ZOX:[id number] DOB: 07-Feb-1961 DOA: 01/18/2012 PCP: Georganna Skeans, MD, MD  Brief narrative: [name] [name] is a 51 year old man with multiple cardiac risk factors who was admitted to the hospital on 01/18/12 with chest pain.  He had a stress test today.  Assessment/Plan: Principal Problem:  *Chest pain  Multiple cardiac risk factors.  Admitted to telemetry, cycled cardiac markers (negative x 2), continued ASA, statin. Started beta blocker.  Cardiology consulted for further evaluation with stress test or cardiac catheterization. Seen by Dr. Anne Fu on 01/18/12 and stress test done 01/19/12 with abnormal results. Await cardiology recommendations. TSH WNL. Active Problems:  Hyponatremia  Likely pseudohyponatremia from markedly elevated blood glucoses.  Hydrated and hyperglycemia addressed. DM hyperosmolarity type II, uncontrolled  Resumed oral hypoglycemics.  Started Lantus 10 units SQ Q HS, SSI, insulin resistant scale with 6 units of Novalog Q AC.  DM coordinator saw patient 01/19/12 and recommendations noted. Dietician consult pending. CBGs [id number].  Will increase Lantus to 30 units and increase meal coverage to 8 units. Hemoglobin A1c 13.6% HTN (hypertension)  Resumed home RX with Lisinopril.  Added beta blocker. Hyperlipidemia  FLP shows hypertriglyceridemia.  Continue Lipitor. Add fish oil.   Code Status: Full Family Communication: None, at bedside. Disposition Plan: Home, when stable.  Medical Consultants:  Dr. Donato Schultz, Cardiology  Other consultants:  Diabetes Coordinator  Dietician  Antibiotics:  None   Subjective  [name] [name] denies current chest pain.  He had some neck discomfort and a sensation of being hot earlier today.  No pain during stress test.   Objective    Interim History: Stable overnight.   Objective: Filed Vitals:   01/18/12 1705 01/18/12 1737 01/18/12 2138 01/19/12 0602  BP: 122/71 110/70 121/76 98/56  Pulse: 78 76 75  65  Temp:  97.8 F (36.6 C) 98 F (36.7 C) 98.2 F (36.8 C)  TempSrc:  Oral Oral Oral  Resp:  18 20 18   Height:  5\' 7"  (1.702 m)    Weight:  116.075 kg (255 lb 14.4 oz)    SpO2:  91% 95% 97%    Intake/Output Summary (Last 24 hours) at 01/19/12 1213 Last data filed at 01/19/12 0600  Gross per 24 hour  Intake 1333.33 ml  Output      0 ml  Net 1333.33 ml    Exam: Gen:  NAD Cardiovascular:  RRR, No M/R/G Respiratory: Lungs CTAB Gastrointestinal: Abdomen soft, NT/ND with normal active bowel sounds. Extremities: No C/E/C    Data Reviewed: Basic Metabolic Panel:  Lab 01/18/12 8119 01/18/12 1250  NA -- 130*  K -- 4.4  CL -- 92*  CO2 -- 24  GLUCOSE -- 486*  BUN -- 14  CREATININE 0.63 0.50  CALCIUM -- 9.6  MG -- --  PHOS -- --   GFR Estimated Creatinine Clearance: 134.5 ml/min (by C-G formula based on Cr of 0.63). Liver Function Tests:  Lab 01/18/12 1953  AST 20  ALT 25  ALKPHOS 133*  BILITOT 0.3  PROT 6.9  ALBUMIN 3.3*    CBC:  Lab 01/18/12 1953 01/18/12 1250  WBC 10.3 12.1*  NEUTROABS -- 8.2*  HGB 16.1 17.4*  HCT 46.0 47.4  MCV 88.5 87.9  PLT 255 255   Cardiac Enzymes:  Lab 01/19/12 0336 01/18/12 1955  CKTOTAL 81 76  CKMB 2.0 1.9  CKMBINDEX -- --  TROPONINI <0.30 <0.30   BNP:  Ref. Range 01/18/2012 19:55  Pro B Natriuretic peptide (BNP) Latest Range: 0-125  pg/mL 28.3   CBG:  Lab 01/19/12 0808 01/18/12 2137 01/18/12 1700  GLUCAP 294* 266* 307*   Hgb A1c  Basename 01/18/12 1953  HGBA1C 13.6*   Lipid Profile  Basename 01/19/12 0336  CHOL 171  HDL 25*  LDLCALC 84  TRIG 161*  CHOLHDL 6.8  LDLDIRECT --   Thyroid function studies  Basename 01/18/12 1953  TSH 1.100  T4TOTAL --  T3FREE --  THYROIDAB --    Procedures and Diagnostic Studies:  Dg Chest 2 View 01/18/2012   IMPRESSION: Mildly enlarged cardiopericardial silhouette appears similar to prior.  No failure or acute cardiopulmonary disease.  Original Report Authenticated  By: Andreas Newport, M.D.    Scheduled Meds:   . aspirin  324 mg Oral Once  . aspirin  81 mg Oral Daily  . atorvastatin  40 mg Oral Daily  . enoxaparin  40 mg Subcutaneous Q24H  . glipiZIDE  10 mg Oral Daily  . insulin aspart  0-20 Units Subcutaneous TID WC  . insulin aspart  0-5 Units Subcutaneous QHS  . insulin aspart  6 Units Subcutaneous TID WC  . insulin aspart protamine-insulin aspart  10 Units Subcutaneous Once  . insulin glargine  10 Units Subcutaneous QHS  . lisinopril  5 mg Oral Daily  . metFORMIN  1,000 mg Oral BID WC  . metoprolol succinate  12.5 mg Oral Daily  . pioglitazone  30 mg Oral Daily  . pneumococcal 23 valent vaccine  0.5 mL Intramuscular Tomorrow-1000  . sodium chloride  1,000 mL Intravenous Once  . sodium chloride  3 mL Intravenous Q12H  . sodium chloride  3 mL Intravenous Q12H  . DISCONTD: aspirin  81 mg Oral Daily  . DISCONTD: insulin aspart protamine-insulin aspart  10 Units Subcutaneous Once   Continuous Infusions:   . sodium chloride 100 mL/hr (01/18/12 1904)      LOS: 1 day   Hillery Aldo, MD Pager [phone]  01/19/2012, 12:13 PM

## 2012-01-19 NOTE — Progress Notes (Signed)
Pt transported from [facility] to [facility] nuclear medicine for Lexiscan cardiac stress test.  Dr. Anne Fu at bedside for start and throughout exam.  Started at 1230, completed at 1235.  Pt tolerated well without complaint of discomfort

## 2012-01-20 ENCOUNTER — Encounter (HOSPITAL_COMMUNITY)
Admission: EM | Disposition: A | Discharge status: Home / Self Care | Payer: Self-pay | Source: Home / Self Care | Attending: Internal Medicine

## 2012-01-20 DIAGNOSIS — R079 Chest pain, unspecified: Secondary | ICD-10-CM

## 2012-01-20 DIAGNOSIS — I10 Essential (primary) hypertension: Secondary | ICD-10-CM

## 2012-01-20 DIAGNOSIS — E1165 Type 2 diabetes mellitus with hyperglycemia: Secondary | ICD-10-CM

## 2012-01-20 DIAGNOSIS — E782 Mixed hyperlipidemia: Secondary | ICD-10-CM

## 2012-01-20 HISTORY — PX: PERCUTANEOUS CORONARY STENT INTERVENTION (PCI-S): SHX5485

## 2012-01-20 HISTORY — PX: LEFT HEART CATHETERIZATION WITH CORONARY ANGIOGRAM: SHX5451

## 2012-01-20 LAB — CBC
HCT: 44.2 % (ref 39.0–52.0)
Hemoglobin: 15.6 g/dL (ref 13.0–17.0)
MCV: 88.8 fL (ref 78.0–100.0)
RBC: 4.98 MIL/uL (ref 4.22–5.81)
WBC: 11 10*3/uL — ABNORMAL HIGH (ref 4.0–10.5)

## 2012-01-20 LAB — GLUCOSE, CAPILLARY
Glucose-Capillary: 215 mg/dL — ABNORMAL HIGH (ref 70–99)
Glucose-Capillary: 200 mg/dL — ABNORMAL HIGH (ref 70–99)
Glucose-Capillary: 239 mg/dL — ABNORMAL HIGH (ref 70–99)

## 2012-01-20 LAB — BASIC METABOLIC PANEL
CO2: 23 mEq/L (ref 19–32)
Chloride: 100 mEq/L (ref 96–112)
Potassium: 3.8 mEq/L (ref 3.5–5.1)
Sodium: 133 mEq/L — ABNORMAL LOW (ref 135–145)

## 2012-01-20 LAB — POCT ACTIVATED CLOTTING TIME: Activated Clotting Time: 463 seconds

## 2012-01-20 LAB — APTT: aPTT: 27 seconds (ref 24–37)

## 2012-01-20 SURGERY — LEFT HEART CATHETERIZATION WITH CORONARY ANGIOGRAM
Anesthesia: LOCAL

## 2012-01-20 MED ORDER — METFORMIN HCL 500 MG PO TABS
1000.0000 mg | ORAL_TABLET | Freq: Two times a day (BID) | ORAL | Status: DC
  Filled 2012-01-20: qty 2

## 2012-01-20 MED ORDER — TICAGRELOR 90 MG PO TABS
ORAL_TABLET | ORAL | Status: AC
  Filled 2012-01-20: qty 2

## 2012-01-20 MED ORDER — LIDOCAINE HCL (PF) 1 % IJ SOLN
INTRAMUSCULAR | Status: AC
  Filled 2012-01-20: qty 30

## 2012-01-20 MED ORDER — MIDAZOLAM HCL 2 MG/2ML IJ SOLN
INTRAMUSCULAR | Status: AC
  Filled 2012-01-20: qty 2

## 2012-01-20 MED ORDER — NITROGLYCERIN 0.2 MG/ML ON CALL CATH LAB
INTRAVENOUS | Status: AC
  Filled 2012-01-20: qty 1

## 2012-01-20 MED ORDER — HEPARIN (PORCINE) IN NACL 2-0.9 UNIT/ML-% IJ SOLN
INTRAMUSCULAR | Status: AC
  Filled 2012-01-20: qty 1000

## 2012-01-20 MED ORDER — HEPARIN SODIUM (PORCINE) 1000 UNIT/ML IJ SOLN
INTRAMUSCULAR | Status: AC
  Filled 2012-01-20: qty 1

## 2012-01-20 MED ORDER — PRASUGREL HCL 10 MG PO TABS
10.0000 mg | ORAL_TABLET | Freq: Every day | ORAL | Status: DC
  Filled 2012-01-20: qty 1

## 2012-01-20 MED ORDER — SODIUM CHLORIDE 0.9 % IV SOLN
0.2500 mg/kg/h | INTRAVENOUS | Status: DC
  Administered 2012-01-20: 0.25 mg/kg/h via INTRAVENOUS
  Filled 2012-01-20: qty 250

## 2012-01-20 MED ORDER — PRASUGREL HCL 10 MG PO TABS
10.0000 mg | ORAL_TABLET | Freq: Every day | ORAL | Status: DC
  Administered 2012-01-21: 10 mg via ORAL
  Filled 2012-01-20 (×2): qty 1

## 2012-01-20 MED ORDER — ACETAMINOPHEN 325 MG PO TABS: 650.0000 mg | ORAL_TABLET | ORAL | Status: DC | PRN

## 2012-01-20 MED ORDER — INSULIN ASPART 100 UNIT/ML ~~LOC~~ SOLN
0.0000 [IU] | Freq: Once | SUBCUTANEOUS | Status: AC
  Administered 2012-01-20: 4 [IU] via SUBCUTANEOUS

## 2012-01-20 MED ORDER — BIVALIRUDIN 250 MG IV SOLR
INTRAVENOUS | Status: AC
  Filled 2012-01-20: qty 250

## 2012-01-20 MED ORDER — SODIUM CHLORIDE 0.9 % IV SOLN
1.0000 mL/kg/h | INTRAVENOUS | Status: AC
  Administered 2012-01-20: 1 mL/kg/h via INTRAVENOUS

## 2012-01-20 MED ORDER — FENTANYL CITRATE 0.05 MG/ML IJ SOLN
INTRAMUSCULAR | Status: AC
  Filled 2012-01-20: qty 2

## 2012-01-20 MED ORDER — LIVING WELL WITH DIABETES BOOK
Freq: Once | Status: AC
  Administered 2012-01-20: 17:00:00
  Filled 2012-01-20: qty 1

## 2012-01-20 NOTE — Progress Notes (Signed)
Effient Samples have been personally handed to him. A 30 day prescription has been given to him as well. He should be able to qualify for free prescription.  Bare-metal stent to the right coronary artery has been placed. An absolute minimum, 30 days of dual antiplatelet therapy. Optimally, one year.  Followup has been set up.  If he does well overnight, he may be discharged tomorrow from a cardiovascular standpoint. I have relayed to Dr. Thedore Mins.

## 2012-01-20 NOTE — Progress Notes (Signed)
Subjective:  Doing well. No chest burning overnight. He once again discussed stress test results. I spoke with his mother last night on the phone. Discussed results. Discussed risks and benefits of cardiac catheterization. He is willing to proceed.  Objective:  Vital Signs in the last 24 hours: Temp:  [97.5 F (36.4 C)-98.2 F (36.8 C)] 97.5 F (36.4 C) (05/10 0543) Pulse Rate:  [66-86] 71  (05/10 0543) Resp:  [18-20] 20  (05/10 0543) BP: (100-112)/(64-75) 109/66 mmHg (05/10 0543) SpO2:  [93 %-95 %] 95 % (05/10 0543) Weight:  [118.207 kg (260 lb 9.6 oz)] 118.207 kg (260 lb 9.6 oz) (05/10 0543)  Intake/Output from previous day: 05/09 0701 - 05/10 0700 In: 240 [P.O.:240] Out: -    Physical Exam: General: Well developed, well nourished, in no acute distress. Head:  Normocephalic and atraumatic. Lungs: Clear to auscultation and percussion. Heart: Normal S1 and S2.  No murmur, rubs or gallops.  Pulses: Pulses normal in all 4 extremities. Abdomen: soft, non-tender, positive bowel sounds. Extremities: No clubbing or cyanosis. No edema. Neurologic: Alert and oriented x 3.    Lab Results:  Basename 01/20/12 0448 01/18/12 1953  WBC 11.0* 10.3  HGB 15.6 16.1  PLT 220 255    Basename 01/20/12 0448 01/18/12 1953 01/18/12 1250  NA 133* -- 130*  K 3.8 -- 4.4  CL 100 -- 92*  CO2 23 -- 24  GLUCOSE 241* -- 486*  BUN 9 -- 14  CREATININE 0.52 0.63 --    Basename 01/19/12 1351 01/19/12 0336  TROPONINI <0.30 <0.30   Hepatic Function Panel  Basename 01/18/12 1953  PROT 6.9  ALBUMIN 3.3*  AST 20  ALT 25  ALKPHOS 133*  BILITOT 0.3  BILIDIR <0.1  IBILI NOT CALCULATED    Basename 01/19/12 0336  CHOL 171   No results found for this basename: PROTIME in the last 72 hours  Imaging: Dg Chest 2 View  01/18/2012  *RADIOLOGY REPORT*  Clinical Data: Mid and upper chest pain.  Smoker.  CHEST - 2 VIEW  Comparison: None.  Findings: Cardiopericardial silhouette is mildly enlarged for  projection.  No airspace disease.  No effusion.  Trachea midline. Mediastinal contours are within normal limits.  IMPRESSION: Mildly enlarged cardiopericardial silhouette appears similar to prior.  No failure or acute cardiopulmonary disease.  Original Report Authenticated By: Andreas Newport, M.D.   Nm Myocar Multi W/spect W/wall Motion / Ef  01/19/2012  *RADIOLOGY REPORT*  Clinical Data:  Chest pain.  MYOCARDIAL IMAGING WITH SPECT (REST AND PHARMACOLOGIC-STRESS) GATED LEFT VENTRICULAR WALL MOTION STUDY LEFT VENTRICULAR EJECTION FRACTION  Technique:  Standard myocardial SPECT imaging was performed after resting intravenous injection of  10 mCi Tc-70m tetrofosmin. Subsequently, intravenous infusion of Lexiscan was performed under the supervision of the Cardiology staff.  At peak effect of the drug, 30 mCi Tc-39m tetrofosmin was injected intravenously and standard myocardial SPECT  imaging was performed.  Quantitative gated imaging was also performed to evaluate left ventricular wall motion, and estimate left ventricular ejection fraction.  Comparison:  none  Findings: SPECT imaging demonstrates decreased activity inferiorly which worsens on stress images.  Findings compatible with inferior infarct with peri-infarct ischemia.  Quantitative gated analysis shows decrease wall motion in the inferior wall, otherwise normal.  The resting left ventricular ejection fraction is 49% with end- diastolic volume of 106 ml and end-systolic volume of 54 ml.  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  Ejection fraction 49%.  Original Report Authenticated By: Aubery Lapping  Kearney Hard, M.D.   Personally viewed.   Telemetry: No adverse arrhythmias Personally viewed.   EKG:  Normal sinus rhythm, no obvious abnormalities  Cardiac Studies:  Nuclear stress test as above.  Assessment/Plan:  Principal Problem:  *Chest pain Active Problems:  Hyponatremia  DM hyperosmolarity type II, uncontrolled  HTN  (hypertension)  Hyperlipidemia  OSA on CPAP   - Proceeding with cardiac catheterization. Risks and benefits of the procedure have been explained including stroke, heart attack, death, bleeding. Radial artery approach. I have discussed with my partner Dr. Eldridge Dace. I've also discussed with his mother.    - Holding metformin.  - Treating sleep apnea.  - Glucose is under better control.  - Continue to encourage weight loss. Diet modification.  - If a stent is needed, he will remain on hospitalist service at [facility].  [place], [place] 01/20/2012, 9:24 AM

## 2012-01-20 NOTE — Progress Notes (Signed)
TR BAND REMOVAL  LOCATION:  right radial  DEFLATED PER PROTOCOL:  yes  TIME BAND OFF / DRESSING APPLIED:   1400   SITE UPON ARRIVAL:   Level 0  SITE AFTER BAND REMOVAL:  Level 0  REVERSE ALLEN'S TEST:    positive  CIRCULATION SENSATION AND MOVEMENT:  Within Normal Limits  yes  COMMENTS:     

## 2012-01-20 NOTE — Progress Notes (Signed)
01/20/12  Please see previous Diabetes Coordinator note by Lenor Coffin, RN on 01/19/12.  DM videos, insulin starter kit, staff RNs to educate patient on insulin has been ordered.  Will continue to follow while in hospital.  Smith Mince RN BSN

## 2012-01-20 NOTE — CV Procedure (Addendum)
PROCEDURE:  Left heart catheterization with selective coronary angiography, left ventriculogram.  Aspiration thrombectomy; PCI RCA.  INDICATIONS:    The risks, benefits, and details of the procedure were explained to the patient.  The patient verbalized understanding and wanted to proceed.  Informed written consent was obtained.  PROCEDURE TECHNIQUE:  After Xylocaine anesthesia a 66F sheath was placed in the right radial artery with a single anterior needle wall stick.   5000 units of heparin was administered when access to the ascending aorta was obtained.  Left coronary angiography was done using a Judkins L4 guide catheter.  Right coronary angiography was done using a Judkins R4 guide catheter.  Left ventriculography was done using a pigtail catheter.   Angiomax was used for anticoagulation for the intervention.  Intracoronary nitroglycerin was administered during the intervention.  A CT was used to confirm that the Angiomax is therapeutic.   CONTRAST:  Total of 110 cc.  COMPLICATIONS:  None.    HEMODYNAMICS:  Aortic pressure was 109/68; LV pressure was 113/13; LVEDP 15.  There was no gradient between the left ventricle and aorta.    ANGIOGRAPHIC DATA:   The left main coronary artery is a short vessel but widely patent..  The left anterior descending artery is a large vessel proximally.  The mid to distal vessel is medium-sized.  The vessel appears widely patent.  There is a small first diagonal which is widely patent.  The left circumflex artery is a medium-sized vessel.  The proximal vessel appears widely patent.  In the mid vessel there is a focal 30% lesion just after an atrial branch.  There is a large branching OM 1.  Both branches appear widely patent..  The right coronary artery is a large dominant vessel.  In the proximal portion of the vessel, there is a thrombotic, 90% stenosis.  Disease extends somewhat proximal to this focal area of severe stenosis.  The posterior descending  artery is a large vessel and reaches the apex.  The posterior lateral artery is a large branching vessel.  These are also widely patent.  There is mild atherosclerosis in the remainder of the right coronary artery.  LEFT VENTRICULOGRAM:  Left ventricular angiogram was done in the 30 RAO projection and revealed normal left ventricular wall motion and systolic function with an estimated ejection fraction of 55%.  LVEDP was 15 mmHg.   PCI NARRATIVE: A JR 4 guiding catheters used to engage the right coronary artery.  A pro-water wire was placed across the area of disease.  An expressway, aspiration catheter was used initially and 15 cc of blood was aspirated.  There was a mild improvement in the tightest portion of the stenosis.  There did appear to be some residual thrombus.  A 2.5 x 12 emerge balloon was used to predilate the lesion to 8 atmospheres.  A 3.5 x 18 vision stent was deployed at 20 atmospheres.  A 4.0 x 15 Sandy Quantum apex balloon was used to post dilate the stent with several balloon inflations up to 20 atmospheres.  Intracoronary nitroglycerin was used to successfully treat vasospasm.  IMPRESSIONS:  1. 90%, thrombotic proximal RCA lesion.  No other significant coronary disease noted. 2. Normal left ventricular systolic function.  LVEDP 15 mmHg.  Ejection fraction 55%. 3.   Successful bare metal stent placement to the proximal right coronary artery with a 3.5 x 18 vision stent, postdilated to 4.2 mm in diameter.  RECOMMENDATION:  The patient we watched overnight.  He needs to  continue dual antiplatelet therapy for at least one month.  A bare metal stent was chosen because there is a question about his compliance with taking medications long term.  We will be able to get him a 30 day supply of Effient free of cost.  He will stay on aspirin lifelong.  Avoid bending the right wrist for 48 hours.  He will followup with Dr. Anne Fu.

## 2012-01-20 NOTE — Progress Notes (Signed)
Patient Demographics  [name] [name], is a 51 y.o. male  ZOX:[id number]  [id number]  DOB - Nov 12, 1960  Admit date - 01/18/2012  Admitting Physician Maryruth Bun Rama, MD  Outpatient Primary MD for the patient is Georganna Skeans, MD, MD  LOS - 2     Chief Complaint  Patient presents with  . Chest Pain        Subjective:   [name] [name] today has, No headache, No chest pain, No abdominal pain - No Nausea, No new weakness tingling or numbness, No Cough - SOB.    Objective:   Filed Vitals:   01/19/12 2206 01/20/12 0543 01/20/12 0938 01/20/12 1211  BP: 104/68 109/66  126/83  Pulse: 70 71 42 69  Temp: 98.2 F (36.8 C) 97.5 F (36.4 C)  97.7 F (36.5 C)  TempSrc: Oral Oral  Oral  Resp: 20 20  14   Height:      Weight:  118.207 kg (260 lb 9.6 oz)    SpO2: 93% 95%  100%    Wt Readings from Last 3 Encounters:  01/20/12 118.207 kg (260 lb 9.6 oz)  01/20/12 118.207 kg (260 lb 9.6 oz)     Intake/Output Summary (Last 24 hours) at 01/20/12 1409 Last data filed at 01/20/12 1300  Gross per 24 hour  Intake    480 ml  Output    800 ml  Net   -320 ml    Exam Awake Alert, Oriented *3, No new F.N deficits, Normal affect Brush.AT,PERRAL Supple Neck,No JVD, No cervical lymphadenopathy appriciated.  Symmetrical Chest wall movement, Good air movement bilaterally, CTAB RRR,No Gallops,Rubs or new Murmurs, No Parasternal Heave +ve B.Sounds, Abd Soft, Non tender, No organomegaly appriciated, No rebound -guarding or rigidity. No Cyanosis, Clubbing or edema, No new Rash or bruise   Data Review  CBC  Lab 01/20/12 0448 01/18/12 1953 01/18/12 1250  WBC 11.0* 10.3 12.1*  HGB 15.6 16.1 17.4*  HCT 44.2 46.0 47.4  PLT 220 255 255  MCV 88.8 88.5 87.9  MCH 31.3 31.0 32.3  MCHC 35.3 35.0 36.7*  RDW 12.0 11.8 11.8  LYMPHSABS -- -- 3.1  MONOABS -- -- 0.7  EOSABS -- -- 0.2  BASOSABS -- -- 0.1  BANDABS -- -- --    Chemistries   Lab 01/20/12 0448 01/18/12 1953 01/18/12 1250  NA 133*  -- 130*  K 3.8 -- 4.4  CL 100 -- 92*  CO2 23 -- 24  GLUCOSE 241* -- 486*  BUN 9 -- 14  CREATININE 0.52 0.63 0.50  CALCIUM 8.3* -- 9.6  MG -- -- --  AST -- 20 --  ALT -- 25 --  ALKPHOS -- 133* --  BILITOT -- 0.3 --   ------------------------------------------------------------------------------------------------------------------ estimated creatinine clearance is 135.8 ml/min (by C-G formula based on Cr of 0.52). ------------------------------------------------------------------------------------------------------------------  [facility] 01/18/12 1953  HGBA1C 13.6*   ------------------------------------------------------------------------------------------------------------------  Basename 01/19/12 0336  CHOL 171  HDL 25*  LDLCALC 84  TRIG 213*  CHOLHDL 6.8  LDLDIRECT --   ------------------------------------------------------------------------------------------------------------------  Alvira Philips 01/18/12 1953  TSH 1.100  T4TOTAL --  T3FREE --  THYROIDAB --   ------------------------------------------------------------------------------------------------------------------ No results found for this basename: VITAMINB12:2,FOLATE:2,FERRITIN:2,TIBC:2,IRON:2,RETICCTPCT:2 in the last 72 hours  Coagulation profile  Lab 01/20/12 0448  INR 0.93  PROTIME --    No results found for this basename: DDIMER:2 in the last 72 hours  Cardiac Enzymes  Lab 01/19/12 1351 01/19/12 0336 01/18/12 1955  CKMB 2.2 2.0 1.9  TROPONINI <0.30 <0.30 <0.30  MYOGLOBIN -- -- --   ------------------------------------------------------------------------------------------------------------------  No components found with this basename: POCBNP:3  Micro Results No results found for this or any previous visit (from the past 240 hour(s)).  Radiology Reports Dg Chest 2 View  01/18/2012  *RADIOLOGY REPORT*  Clinical Data: Mid and upper chest pain.  Smoker.  CHEST - 2 VIEW  Comparison: None.   Findings: Cardiopericardial silhouette is mildly enlarged for projection.  No airspace disease.  No effusion.  Trachea midline. Mediastinal contours are within normal limits.  IMPRESSION: Mildly enlarged cardiopericardial silhouette appears similar to prior.  No failure or acute cardiopulmonary disease.  Original Report Authenticated By: Andreas Newport, M.D.   Nm Myocar Multi W/spect W/wall Motion / Ef  01/19/2012  *RADIOLOGY REPORT*  Clinical Data:  Chest pain.  MYOCARDIAL IMAGING WITH SPECT (REST AND PHARMACOLOGIC-STRESS) GATED LEFT VENTRICULAR WALL MOTION STUDY LEFT VENTRICULAR EJECTION FRACTION  Technique:  Standard myocardial SPECT imaging was performed after resting intravenous injection of  10 mCi Tc-22m tetrofosmin. Subsequently, intravenous infusion of Lexiscan was performed under the supervision of the Cardiology staff.  At peak effect of the drug, 30 mCi Tc-74m tetrofosmin was injected intravenously and standard myocardial SPECT  imaging was performed.  Quantitative gated imaging was also performed to evaluate left ventricular wall motion, and estimate left ventricular ejection fraction.  Comparison:  none  Findings: SPECT imaging demonstrates decreased activity inferiorly which worsens on stress images.  Findings compatible with inferior infarct with peri-infarct ischemia.  Quantitative gated analysis shows decrease wall motion in the inferior wall, otherwise normal.  The resting left ventricular ejection fraction is 49% with end- diastolic volume of 106 ml and end-systolic volume of 54 ml.  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  Ejection fraction 49%.  Original Report Authenticated By: Cyndie Chime, M.D.    Scheduled Meds:   . aspirin  324 mg Oral Pre-Cath  . aspirin  81 mg Oral Daily  . atorvastatin  40 mg Oral Daily  . bd getting started take home kit  1 kit Other Once  . bivalirudin      . fentaNYL      . glipiZIDE  10 mg Oral Daily  . heparin        . heparin      . insulin aspart  0-20 Units Subcutaneous TID WC  . insulin aspart  0-5 Units Subcutaneous QHS  . insulin aspart  8 Units Subcutaneous TID WC  . insulin glargine  30 Units Subcutaneous QHS  . lidocaine      . lisinopril  5 mg Oral Daily  . living well with diabetes book   Does not apply Once  . metFORMIN  1,000 mg Oral BID WC  . metoprolol succinate  12.5 mg Oral Daily  . midazolam      . nitroGLYCERIN      . omega-3 acid ethyl esters  1 g Oral BID  . pioglitazone  30 mg Oral Daily  . pneumococcal 23 valent vaccine  0.5 mL Intramuscular Tomorrow-1000  . prasugrel  10 mg Oral Daily  . Ticagrelor      . DISCONTD: aspirin  81 mg Oral Daily  . DISCONTD: bivalirudin (ANGIOMAX) infusion 5 mg/mL (Cath Lab,ACS,PCI indication)  0.25 mg/kg/hr Intravenous To Cath  . DISCONTD: enoxaparin  40 mg Subcutaneous Q24H  . DISCONTD: insulin glargine  20 Units Subcutaneous QHS  . DISCONTD: metFORMIN  1,000 mg Oral BID WC  . DISCONTD: prasugrel  10 mg Oral Daily  . DISCONTD: sodium chloride  3 mL Intravenous Q12H  .  DISCONTD: sodium chloride  3 mL Intravenous Q12H  . DISCONTD: sodium chloride  3 mL Intravenous Q12H   Continuous Infusions:   . sodium chloride 1 mL/kg/hr (01/20/12 1200)  . DISCONTD: sodium chloride 100 mL/hr at 01/20/12 0329  . DISCONTD: sodium chloride 75 mL/hr at 01/20/12 0400   PRN Meds:.acetaminophen, alum & mag hydroxide-simeth, morphine injection, ondansetron (ZOFRAN) IV, ondansetron, oxyCODONE, polyethylene glycol, DISCONTD: sodium chloride, DISCONTD: sodium chloride, DISCONTD: acetaminophen, DISCONTD: acetaminophen, DISCONTD: sodium chloride, DISCONTD: sodium chloride  Assessment & Plan   1. Chest pain - cardiac in urgent, patient also ruled out for MI had a positive stress test, status post left heart cath with RCA stenting, patient got a bare metal stent by Dr.Varanasi, he she will be continued on dual anti platelet therapy for one month and then aspirin for  the rest of his life, continue beta blocker statin and risk factor moderation. IT will discharge in the morning with outpatient followup with Dr. Chales Abrahams.   2. Diabetes mellitus type 2 and extremely poor control patient noncompliant with his diet A1c 13.6- Diabetes coordinator consulted to educate patient on diabetes management diet management, Accu-Cheks and possible insulin use. For now we'll continue the patient on Lantus, pre-meal NovoLog plus NovoLog sliding scale insulin along with his oral hypoglycemic agents except Glucophage due to recent cath. We'll increase Lantus and pre-meal NovoLog if trend remains high.   CBG (last 3)   Basename 01/20/12 1215 01/20/12 0749 01/19/12 2157  GLUCAP 215* 243* 186*    Lab Results  Component Value Date   HGBA1C 13.6* 01/18/2012     3. Dyslipidemia currently LDL is under 100 continue home dose statin than outpatient followup by primary care physician. Fish oil has been added for high triglycerides.   Lab Results  Component Value Date   CHOL 171 01/19/2012   HDL 25* 01/19/2012   LDLCALC 84 01/19/2012   TRIG 312* 01/19/2012   CHOLHDL 6.8 01/19/2012     4. Ongoing smoking patient counseled to quit smoking Will C. smoking cessation consultant in the hospital.    5. Hypertension blood pressure stable continue present medications.    6. Hyponatremia likely pseudo-due to hyponatremia, glycemic control as above gentle normal saline to be continued which is being given after his left heart cath. We'll check BMP in the morning.   7. Obstructive sleep apnea continue home CPAP device Nighttime     DVT Prophylaxis    SCDs      Leroy Sea M.D on 01/20/2012 at 2:09 PM  [facility] Office  [phone]

## 2012-01-21 DIAGNOSIS — I2510 Atherosclerotic heart disease of native coronary artery without angina pectoris: Secondary | ICD-10-CM

## 2012-01-21 DIAGNOSIS — IMO0001 Reserved for inherently not codable concepts without codable children: Secondary | ICD-10-CM

## 2012-01-21 DIAGNOSIS — R079 Chest pain, unspecified: Secondary | ICD-10-CM

## 2012-01-21 DIAGNOSIS — E782 Mixed hyperlipidemia: Secondary | ICD-10-CM

## 2012-01-21 DIAGNOSIS — I10 Essential (primary) hypertension: Secondary | ICD-10-CM

## 2012-01-21 DIAGNOSIS — E1165 Type 2 diabetes mellitus with hyperglycemia: Secondary | ICD-10-CM

## 2012-01-21 DIAGNOSIS — E1100 Type 2 diabetes mellitus with hyperosmolarity without nonketotic hyperglycemic-hyperosmolar coma (NKHHC): Secondary | ICD-10-CM

## 2012-01-21 LAB — BASIC METABOLIC PANEL
Chloride: 103 mEq/L (ref 96–112)
GFR calc Af Amer: >90 mL/min (ref >90)
GFR calc non Af Amer: >90 mL/min (ref >90)
Potassium: 3.5 mEq/L (ref 3.5–5.1)
Sodium: 136 mEq/L (ref 135–145)

## 2012-01-21 LAB — CBC
HCT: 42 % (ref 39.0–52.0)
MCHC: 35.7 g/dL (ref 30.0–36.0)
Platelets: 204 10*3/uL (ref 150–400)
RDW: 12 % (ref 11.5–15.5)
WBC: 11.6 10*3/uL — ABNORMAL HIGH (ref 4.0–10.5)

## 2012-01-21 LAB — GLUCOSE, CAPILLARY: Glucose-Capillary: 198 mg/dL — ABNORMAL HIGH (ref 70–99)

## 2012-01-21 MED ORDER — FREESTYLE SYSTEM KIT: 1.0000 | PACK | Freq: Three times a day (TID) | Status: DC

## 2012-01-21 MED ORDER — INSULIN ASPART 100 UNIT/ML ~~LOC~~ SOLN: SUBCUTANEOUS | Status: DC

## 2012-01-21 MED ORDER — LISINOPRIL 5 MG PO TABS: 5.0000 mg | ORAL_TABLET | Freq: Every day | ORAL | Status: DC

## 2012-01-21 MED ORDER — PIOGLITAZONE HCL 30 MG PO TABS: 30.0000 mg | ORAL_TABLET | Freq: Every day | ORAL | Status: DC

## 2012-01-21 MED ORDER — ASPIRIN 81 MG PO CHEW: 81.0000 mg | CHEWABLE_TABLET | Freq: Every day | ORAL | Status: AC

## 2012-01-21 MED ORDER — INSULIN GLARGINE 100 UNIT/ML ~~LOC~~ SOLN: 30.0000 [IU] | Freq: Every day | SUBCUTANEOUS | Status: DC

## 2012-01-21 MED ORDER — OMEGA-3 FATTY ACIDS 1000 MG PO CAPS: 2.0000 g | ORAL_CAPSULE | Freq: Every day | ORAL | Status: DC

## 2012-01-21 MED ORDER — ASPIRIN 81 MG PO CHEW: 81.0000 mg | CHEWABLE_TABLET | Freq: Every day | ORAL | Status: DC

## 2012-01-21 MED ORDER — GLIPIZIDE 10 MG PO TABS: 10.0000 mg | ORAL_TABLET | Freq: Every day | ORAL | Status: DC

## 2012-01-21 MED ORDER — METFORMIN HCL 1000 MG PO TABS: 1000.0000 mg | ORAL_TABLET | Freq: Two times a day (BID) | ORAL | Status: DC

## 2012-01-21 MED ORDER — METOPROLOL SUCCINATE 12.5 MG HALF TABLET: 12.5000 mg | ORAL_TABLET | Freq: Every day | ORAL | Status: DC

## 2012-01-21 MED ORDER — ATORVASTATIN CALCIUM 40 MG PO TABS: 40.0000 mg | ORAL_TABLET | Freq: Every day | ORAL | Status: DC

## 2012-01-21 MED ORDER — INSULIN ASPART 100 UNIT/ML ~~LOC~~ SOLN: 8.0000 [IU] | Freq: Three times a day (TID) | SUBCUTANEOUS | Status: DC

## 2012-01-21 MED ORDER — PRASUGREL HCL 10 MG PO TABS: 10.0000 mg | ORAL_TABLET | Freq: Every day | ORAL | Status: DC

## 2012-01-21 NOTE — Progress Notes (Signed)
Subjective:   Did well with cath. Had PCI of RCA with bare metal stent. No CP. No access site complications   Objective:  Vital Signs in the last 24 hours: Temp:  [97.7 F (36.5 C)-98.1 F (36.7 C)] 97.8 F (36.6 C) (05/11 0430) Pulse Rate:  [42-82] 75  (05/11 0430) Resp:  [14-19] 19  (05/11 0030) BP: (121-126)/(70-86) 125/86 mmHg (05/11 0430) SpO2:  [96 %-100 %] 96 % (05/11 0430)  Intake/Output from previous day: 05/10 0701 - 05/11 0700 In: 1547.4 [P.O.:720; I.V.:827.4] Out: 800 [Urine:800]   Physical Exam: General: Well developed, well nourished, in no acute distress. Head:  Normocephalic and atraumatic. Lungs: Clear to auscultation and percussion. Heart: Normal S1 and S2.  No murmur, rubs or gallops.  Pulses: Pulses normal in all 4 extremities. Abdomen: soft, non-tender, positive bowel sounds. Extremities: No clubbing or cyanosis. No edema. Radial access site no bruit Neurologic: Alert and oriented x 3.    Lab Results:  Basename 01/21/12 0403 01/20/12 0448  WBC 11.6* 11.0*  HGB 15.0 15.6  PLT 204 220    Basename 01/21/12 0403 01/20/12 0448  NA 136 133*  K 3.5 3.8  CL 103 100  CO2 23 23  GLUCOSE 212* 241*  BUN 7 9  CREATININE 0.57 0.52    Basename 01/19/12 1351 01/19/12 0336  TROPONINI <0.30 <0.30   Hepatic Function Panel  Basename 01/18/12 1953  PROT 6.9  ALBUMIN 3.3*  AST 20  ALT 25  ALKPHOS 133*  BILITOT 0.3  BILIDIR <0.1  IBILI NOT CALCULATED    Basename 01/19/12 0336  CHOL 171   No results found for this basename: PROTIME in the last 72 hours  Imaging: Nm Myocar Multi W/spect W/wall Motion / Ef  01/19/2012  *RADIOLOGY REPORT*  Clinical Data:  Chest pain.  MYOCARDIAL IMAGING WITH SPECT (REST AND PHARMACOLOGIC-STRESS) GATED LEFT VENTRICULAR WALL MOTION STUDY LEFT VENTRICULAR EJECTION FRACTION  Technique:  Standard myocardial SPECT imaging was performed after resting intravenous injection of  10 mCi Tc-14m tetrofosmin. Subsequently,  intravenous infusion of Lexiscan was performed under the supervision of the Cardiology staff.  At peak effect of the drug, 30 mCi Tc-56m tetrofosmin was injected intravenously and standard myocardial SPECT  imaging was performed.  Quantitative gated imaging was also performed to evaluate left ventricular wall motion, and estimate left ventricular ejection fraction.  Comparison:  none  Findings: SPECT imaging demonstrates decreased activity inferiorly which worsens on stress images.  Findings compatible with inferior infarct with peri-infarct ischemia.  Quantitative gated analysis shows decrease wall motion in the inferior wall, otherwise normal.  The resting left ventricular ejection fraction is 49% with end- diastolic volume of 106 ml and end-systolic volume of 54 ml.  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  Ejection fraction 49%.  Original Report Authenticated By: Cyndie Chime, M.D.   Personally viewed.   Telemetry: No adverse arrhythmias Personally viewed. Rates 60-70s sinus  EKG:  Normal sinus rhythm, no obvious abnormalities  Assessment/Plan:  Principal Problem:  *Chest pain Active Problems:  Hyponatremia  DM hyperosmolarity type II, uncontrolled  HTN (hypertension)  Hyperlipidemia  OSA on CPAP   PLAN:  Doing well post cath. Can go home today. Continue effient for 1 month (samples provided). Resume metformin on Monday. Continue statin. Refer for cardiac rehab. (Consult placed). F/u with Dr. Anne Fu.   Arrayah Connors 01/21/2012, 8:05 AM

## 2012-01-21 NOTE — Progress Notes (Signed)
   CARE MANAGEMENT NOTE 01/21/2012  Patient:  [name] [name],[name] [name]   Account Number:  [id number]  Date Initiated:  01/20/2012  Documentation initiated by:  MAYO,HENRIETTA  Subjective/Objective Assessment:   51 yr-old male adm with chest pain s/p RCA stent placement.     Action/Plan:   Anticipated DC Date:  01/21/2012   Anticipated DC Plan:  HOME/SELF CARE      DC Planning Services  CM consult  Medication Assistance      Choice offered to / List presented to:             Status of service:  Completed, signed off Medicare Important Message given?   (If response is "NO", the following Medicare IM given date fields will be blank) Date Medicare IM given:   Date Additional Medicare IM given:    Discharge Disposition:  HOME/SELF CARE  Per UR Regulation:    If discussed at Long Length of Stay Meetings, dates discussed:    Comments:  01/21/2012 0930 Spoke to pt and he has contact information about [facility]. States he has not been to in awhile. Explained he will need to call to schedule appt and if he could not follow up with [facility] to follow up with Jovita Kussmaul.  NCM at [facility] provided pt with information on [employer]. Pt has follow up with Cristopher Peru NP on 5/20 with [facility]. Contacted main pharmacy and pt is eligible for 3 day meds assistance fund. Prescriptions to main pharmacy. Patient has application for Effient assistance patient assistance fund. Educated pt to take with him to his appt completed with a copy of his proof of income so the physician can complete their portion. Explained he will need to fax information or mail. Also to retain copy for his record. Pt assistance application provided for his other meds by [facility] NCM. Explained process works for those meds also. NCM explained if he is approved they could provide up to a year of free medications. And that [facility] assistance program can be used only once per year. Daryl Eastern RN CCM Case Mgmt phone  [phone]  01/20/12 1600 [facility] RN MSN CCM Provided pt with card for free 30-day supply of Effient. Dr. Isabel Caprice provided pt with samples and talked with pt and family to enhance compliance.

## 2012-01-21 NOTE — Discharge Instructions (Signed)
Follow with Primary MD Georganna Skeans, MD, MD in 7 days   Get CBC, CMP, checked 7 days by Primary MD and again as instructed by your Primary MD.    Get Medicines reviewed and adjusted.   Accuchecks 4 times/day, Once in AM empty stomach and then before each meal. Log in all results and show them to your Prim.MD in 3 days. If any glucose reading is under 80 or above 300 call your Prim MD immidiately. Follow Low glucose instructions for glucose under 80 as instructed.   Please request your Prim.MD to go over all Hospital Tests and Procedure/Radiological results at the follow up, please get all Hospital records sent to your Prim MD by signing hospital release before you go home.  Activity: As tolerated with Full fall precautions use walker/cane & assistance as needed  Diet: Heart Healthy-Low Carb, Aspiration precautions.  For Heart failure patients - Check your Weight same time everyday, if you gain over 2 pounds, or you develop in leg swelling, experience more shortness of breath or chest pain, call your Primary MD immediately. Follow Cardiac Low Salt Diet and 1.8 lit/day fluid restriction.  Disposition Home  If you experience worsening of your admission symptoms, develop shortness of breath, life threatening emergency, suicidal or homicidal thoughts you must seek medical attention immediately by calling 911 or calling your MD immediately  if symptoms less severe.  You Must read complete instructions/literature along with all the possible adverse reactions/side effects for all the Medicines you take and that have been prescribed to you. Take any new Medicines after you have completely understood and accpet all the possible adverse reactions/side effects.   Do not drive if your were admitted for syncope or siezures until you have seen by Primary MD or a Neurologist and advised to drive.  Do not drive when taking Pain medications.    Do not take more than prescribed Pain, Sleep and Anxiety  Medications  Special Instructions: If you have smoked or chewed Tobacco  in the last 2 yrs please stop smoking, stop any regular Alcohol  and or any Recreational drug use.  Wear Seat belts while driving.

## 2012-01-21 NOTE — Progress Notes (Signed)
CARDIAC REHAB PHASE I   PRE:  Rate/Rhythm: 79 sinus   BP:  Supine:   Sitting: 138/52  Standing:    SaO2:   MODE:  Ambulation: 300 ft   POST:  Rate/Rhythem: 81 sinus  BP:  Supine:   Sitting: 127/65  Standing:    SaO2: 98 RA  Pt tolerated walk well without complaint.  Reviewed pt d/c education for diet, exercise, restrictions, and stent book.  Pt request to have info sent here for Cardiac Rehab Phase II.  Pt given financial assistance form for Rehab.  Pt voiced understanding. Fabio Pierce, MA, ACSM RCEP [phone]  Hazle Nordmann

## 2012-01-21 NOTE — Discharge Summary (Signed)
[facility]                                                                                   [name] [name], 51 y.o., is a 51 y.o. male  DOB Aug 25, 1961  MRN [id number].  Admission date:  01/18/2012  Discharge Date:  01/21/2012  Primary MD  Georganna Skeans, MD, MD  Admitting Physician  Maryruth Bun Rama, MD  Admission Diagnosis  Chest pain [786.5] Hyperglycemia [790.29] CHEST PAIN abnormal stress  Discharge Diagnosis     Principal Problem:  *Chest pain Active Problems:  Hyponatremia  DM hyperosmolarity type II, uncontrolled  HTN (hypertension)  Hyperlipidemia  OSA on CPAP  CAD (coronary artery disease)    Past Medical History  Diagnosis Date  . Hypertension   . Hyperlipidemia   . DM (diabetes mellitus)   . OSA on CPAP 01/19/2012    Past Surgical History  Procedure Date  . No past surgeries      Hospital Course See H&P, Labs, Consult and Test reports for all details in brief, patient was admitted for    1. Chest pain - cardiac in origin, patient ruled out for MI but had a positive stress test, status post left heart cath with RCA stenting, patient got a bare metal stent by Dr.Varanasi, patient will be continued on dual anti platelet therapy for at minimum one month and then as per Cardiology, continue beta blocker statin and risk factor moderation.     2. Diabetes mellitus type 2 and extremely poor control patient noncompliant with his diet A1c 13.6- Diabetes coordinator was consulted to educate patient on diabetes management diet management, Accu-Cheks and possible insulin use. For now we'll continue the patient on Lantus, pre-meal NovoLog plus NovoLog sliding scale insulin along with his oral hypoglycemic agents except Glucophage(resume Monday) due to recent cath. On Lantus+Premeal Novolog+ Sliding Scale along with his PO Meds.    CBG (last 3)   Basename 01/21/12 0822 01/20/12 2158 01/20/12 1756  GLUCAP 198*  239* 200*     Lab Results   Component  Value  Date    HGBA1C  13.6*  01/18/2012       3. Dyslipidemia currently LDL is under 100 continue home dose statin than outpatient followup by primary care physician. Fish oil has been added for high triglycerides.     Lab Results   Component  Value  Date    CHOL  171  01/19/2012    HDL  25*  01/19/2012    LDLCALC  84  01/19/2012    TRIG  312*  01/19/2012    CHOLHDL  6.8  01/19/2012       4. Ongoing smoking patient counseled to quit smoking Will C. smoking cessation consultant in the hospital.     5. Hypertension blood pressure stable continue present medications.     6. Obstructive sleep apnea continue home CPAP device Nighttime    Consults  Cardiology,DM Education, S Work, Case Management  Significant Tests:  See full reports for all details     Dg Chest 2 View  01/18/2012  *RADIOLOGY REPORT*  Clinical Data: Mid and upper chest pain.  Smoker.  CHEST -  2 VIEW  Comparison: None.  Findings: Cardiopericardial silhouette is mildly enlarged for projection.  No airspace disease.  No effusion.  Trachea midline. Mediastinal contours are within normal limits.  IMPRESSION: Mildly enlarged cardiopericardial silhouette appears similar to prior.  No failure or acute cardiopulmonary disease.  Original Report Authenticated By: Andreas Newport, M.D.   Nm Myocar Multi W/spect W/wall Motion / Ef  01/19/2012  *RADIOLOGY REPORT*  Clinical Data:  Chest pain.  MYOCARDIAL IMAGING WITH SPECT (REST AND PHARMACOLOGIC-STRESS) GATED LEFT VENTRICULAR WALL MOTION STUDY LEFT VENTRICULAR EJECTION FRACTION  Technique:  Standard myocardial SPECT imaging was performed after resting intravenous injection of  10 mCi Tc-24m tetrofosmin. Subsequently, intravenous infusion of Lexiscan was performed under the supervision of the Cardiology staff.  At peak effect of the drug, 30 mCi Tc-18m tetrofosmin was injected intravenously and standard myocardial SPECT  imaging was performed.   Quantitative gated imaging was also performed to evaluate left ventricular wall motion, and estimate left ventricular ejection fraction.  Comparison:  none  Findings: SPECT imaging demonstrates decreased activity inferiorly which worsens on stress images.  Findings compatible with inferior infarct with peri-infarct ischemia.  Quantitative gated analysis shows decrease wall motion in the inferior wall, otherwise normal.  The resting left ventricular ejection fraction is 49% with end- diastolic volume of 106 ml and end-systolic volume of 54 ml.  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  Ejection fraction 49%.  Original Report Authenticated By: Cyndie Chime, M.D.     Today   Subjective:   Deondrick Searls today has no headache,no chest abdominal pain,no new weakness tingling or numbness, feels much better wants to go home today.    Objective:   Blood pressure 138/52, pulse 75, temperature 97.8 F (36.6 C), temperature source Oral, resp. rate 19, height 5\' 7"  (1.702 m), weight 118.207 kg (260 lb 9.6 oz), SpO2 96.00%.  Intake/Output Summary (Last 24 hours) at 01/21/12 0956 Last data filed at 01/20/12 2200  Gross per 24 hour  Intake 1547.4 ml  Output    800 ml  Net  747.4 ml    Exam Awake Alert, Oriented *3, No new F.N deficits, Normal affect [place].AT,PERRAL Supple Neck,No JVD, No cervical lymphadenopathy appriciated.  Symmetrical Chest wall movement, Good air movement bilaterally, CTAB RRR,No Gallops,Rubs or new Murmurs, No Parasternal Heave +ve B.Sounds, Abd Soft, Non tender, No organomegaly appriciated, No rebound -guarding or rigidity. No Cyanosis, Clubbing or edema, No new Rash or bruise  Data Review      CBC w Diff: Lab Results  Component Value Date   WBC 11.6* 01/21/2012   HGB 15.0 01/21/2012   HCT 42.0 01/21/2012   PLT 204 01/21/2012   LYMPHOPCT 26 01/18/2012   MONOPCT 5 01/18/2012   EOSPCT 2 01/18/2012   BASOPCT 1 01/18/2012   CMP: Lab Results    Component Value Date   NA 136 01/21/2012   K 3.5 01/21/2012   CL 103 01/21/2012   CO2 23 01/21/2012   BUN 7 01/21/2012   CREATININE 0.57 01/21/2012   PROT 6.9 01/18/2012   ALBUMIN 3.3* 01/18/2012   BILITOT 0.3 01/18/2012   ALKPHOS 133* 01/18/2012   AST 20 01/18/2012   ALT 25 01/18/2012  .  Micro Results No results found for this or any previous visit (from the past 240 hour(s)).   Discharge Instructions     Follow with Primary MD Georganna Skeans, MD, MD in 7 days   Get CBC, CMP, checked 7 days by Primary MD and again as  instructed by your Primary MD.    Get Medicines reviewed and adjusted.   Accuchecks 4 times/day, Once in AM empty stomach and then before each meal. Log in all results and show them to your Prim.MD in 3 days. If any glucose reading is under 80 or above 300 call your Prim MD immidiately. Follow Low glucose instructions for glucose under 80 as instructed.   Please request your Prim.MD to go over all Hospital Tests and Procedure/Radiological results at the follow up, please get all Hospital records sent to your Prim MD by signing hospital release before you go home.  Activity: As tolerated with Full fall precautions use walker/cane & assistance as needed  Diet: Heart Healthy-Low Carb, Aspiration precautions.  For Heart failure patients - Check your Weight same time everyday, if you gain over 2 pounds, or you develop in leg swelling, experience more shortness of breath or chest pain, call your Primary MD immediately. Follow Cardiac Low Salt Diet and 1.8 lit/day fluid restriction.  Disposition Home  If you experience worsening of your admission symptoms, develop shortness of breath, life threatening emergency, suicidal or homicidal thoughts you must seek medical attention immediately by calling 911 or calling your MD immediately  if symptoms less severe.  You Must read complete instructions/literature along with all the possible adverse reactions/side effects for all the  Medicines you take and that have been prescribed to you. Take any new Medicines after you have completely understood and accpet all the possible adverse reactions/side effects.   Do not drive if your were admitted for syncope or siezures until you have seen by Primary MD or a Neurologist and advised to drive.  Do not drive when taking Pain medications.    Do not take more than prescribed Pain, Sleep and Anxiety Medications  Special Instructions: If you have smoked or chewed Tobacco  in the last 2 yrs please stop smoking, stop any regular Alcohol  and or any Recreational drug use.  Wear Seat belts while driving.   Follow-up Information    Follow up with FERGUSON,CYNTHIA A, NP on 01/30/2012. (250pm)    Contact information:   [facility] [address] [place] [address] [phone]       Follow up with Donato Schultz, MD in 1 week.   Contact information:   [address] [place] [address] [phone]          Discharge Medications   Medication List  As of 01/21/2012  9:56 AM   START taking these medications         * aspirin 81 MG chewable tablet   Chew 1 tablet (81 mg total) by mouth daily.      fish oil-omega-3 fatty acids 1000 MG capsule   Take 2 capsules (2 g total) by mouth daily.      * glipiZIDE 10 MG tablet   Commonly known as: GLUCOTROL   Take 1 tablet (10 mg total) by mouth daily.      glucose monitoring kit monitoring kit   1 each by Does not apply route 4 (four) times daily - after meals and at bedtime. 1 month Diabetic Testing Supplies for QAC-QHS accuchecks.      * insulin aspart 100 UNIT/ML injection   Commonly known as: novoLOG   Inject 8 Units into the skin 3 (three) times daily with meals.      * insulin aspart 100 UNIT/ML injection   Commonly known as: novoLOG  Before each meal 3 times a day, 140-199 - 2 units, 200-251 - 4 units, 250-299 - 6 units, 300-349 - 8 units,   350 or above 10 units.      insulin glargine 100 UNIT/ML injection   Commonly known as: LANTUS   Inject 30 Units into the skin at bedtime. Can give Lantus PEN      * lisinopril 5 MG tablet   Commonly known as: PRINIVIL,ZESTRIL   Take 1 tablet (5 mg total) by mouth daily.      * metFORMIN 1000 MG tablet   Commonly known as: GLUCOPHAGE   Take 1 tablet (1,000 mg total) by mouth 2 (two) times daily with a meal. RESUME On MONDAY   Start taking on: 01/22/2012      metoprolol succinate 12.5 mg Tb24   Commonly known as: TOPROL-XL   Take 0.5 tablets (12.5 mg total) by mouth daily.      * pioglitazone 30 MG tablet   Commonly known as: ACTOS   Take 1 tablet (30 mg total) by mouth daily.      prasugrel 10 MG Tabs   Commonly known as: EFFIENT   Take 1 tablet (10 mg total) by mouth daily.     * Notice: This list has 7 medication(s) that are the same as other medications prescribed for you. Read the directions carefully, and ask your doctor or other care provider to review them with you.       CONTINUE taking these medications         * aspirin 81 MG chewable tablet      atorvastatin 40 MG tablet   Commonly known as: LIPITOR   Take 1 tablet (40 mg total) by mouth daily.      * glipiZIDE 10 MG tablet   Commonly known as: GLUCOTROL      * lisinopril 5 MG tablet   Commonly known as: PRINIVIL,ZESTRIL      * metFORMIN 1000 MG tablet   Commonly known as: GLUCOPHAGE      * pioglitazone 30 MG tablet   Commonly known as: ACTOS     * Notice: This list has 5 medication(s) that are the same as other medications prescribed for you. Read the directions carefully, and ask your doctor or other care provider to review them with you.        Where to get your medications    These are the prescriptions that you need to pick up.   You may get these medications from any pharmacy.         aspirin 81 MG chewable tablet   atorvastatin 40 MG tablet   fish oil-omega-3 fatty acids 1000 MG capsule    glipiZIDE 10 MG tablet   glucose monitoring kit monitoring kit   insulin aspart 100 UNIT/ML injection   insulin glargine 100 UNIT/ML injection   lisinopril 5 MG tablet   metFORMIN 1000 MG tablet   metoprolol succinate 12.5 mg Tb24   pioglitazone 30 MG tablet   prasugrel 10 MG Tabs             Total Time in preparing paper work, data evaluation and todays exam - 35 minutes  Susa Raring K M.D on 01/21/2012 at 9:56 AM  [facility] Office  [phone]

## 2012-01-23 MED FILL — Dextrose Inj 5%: INTRAVENOUS | Qty: 50 | Status: AC

## 2012-01-23 MED FILL — Nicardipine HCl IV Soln 2.5 MG/ML: INTRAVENOUS | Qty: 1 | Status: AC

## 2012-02-18 ENCOUNTER — Encounter (HOSPITAL_COMMUNITY): Payer: Self-pay | Admitting: Emergency Medicine

## 2012-02-18 ENCOUNTER — Emergency Department (HOSPITAL_COMMUNITY)
Admission: EM | Admit: 2012-02-18 | Discharge: 2012-02-19 | Disposition: A | Discharge status: Home / Self Care | Payer: No Typology Code available for payment source | Attending: Emergency Medicine | Admitting: Emergency Medicine

## 2012-02-18 ENCOUNTER — Emergency Department (HOSPITAL_COMMUNITY): Payer: No Typology Code available for payment source

## 2012-02-18 DIAGNOSIS — E119 Type 2 diabetes mellitus without complications: Secondary | ICD-10-CM | POA: Insufficient documentation

## 2012-02-18 DIAGNOSIS — R059 Cough, unspecified: Secondary | ICD-10-CM | POA: Insufficient documentation

## 2012-02-18 DIAGNOSIS — Z794 Long term (current) use of insulin: Secondary | ICD-10-CM | POA: Insufficient documentation

## 2012-02-18 DIAGNOSIS — R0602 Shortness of breath: Secondary | ICD-10-CM | POA: Insufficient documentation

## 2012-02-18 DIAGNOSIS — J40 Bronchitis, not specified as acute or chronic: Secondary | ICD-10-CM

## 2012-02-18 DIAGNOSIS — R05 Cough: Secondary | ICD-10-CM | POA: Insufficient documentation

## 2012-02-18 DIAGNOSIS — F17200 Nicotine dependence, unspecified, uncomplicated: Secondary | ICD-10-CM | POA: Insufficient documentation

## 2012-02-18 DIAGNOSIS — I10 Essential (primary) hypertension: Secondary | ICD-10-CM | POA: Insufficient documentation

## 2012-02-18 LAB — POCT I-STAT, CHEM 8
BUN: 10 mg/dL (ref 6–23)
Creatinine, Ser: 0.8 mg/dL (ref 0.50–1.35)
Glucose, Bld: 246 mg/dL — ABNORMAL HIGH (ref 70–99)
Sodium: 141 mEq/L (ref 135–145)
TCO2: 25 mmol/L (ref 0–100)

## 2012-02-18 LAB — CARDIAC PANEL(CRET KIN+CKTOT+MB+TROPI)
CK, MB: 2.2 ng/mL (ref 0.3–4.0)
Total CK: 138 U/L (ref 7–232)

## 2012-02-18 MED ORDER — ALBUTEROL SULFATE (5 MG/ML) 0.5% IN NEBU
5.0000 mg | INHALATION_SOLUTION | Freq: Once | RESPIRATORY_TRACT | Status: AC
Start: 2012-02-18 — End: 2012-02-18
  Administered 2012-02-18: 5 mg via RESPIRATORY_TRACT
  Filled 2012-02-18: qty 1

## 2012-02-18 MED ORDER — ALBUTEROL SULFATE HFA 108 (90 BASE) MCG/ACT IN AERS
2.0000 | INHALATION_SPRAY | RESPIRATORY_TRACT | Status: DC | PRN
  Administered 2012-02-19: 2 via RESPIRATORY_TRACT

## 2012-02-18 NOTE — ED Notes (Signed)
Per EMS , pt. Called for ambulance for SOB while eating diner at a restaurant eating chicken tender  As claimed by pt. Denies of chest pain nor N/V, but claimed of " feeling like some stuff stocked in my throat" . Pt. Is able to speak clearly . Denies of chocking.

## 2012-02-18 NOTE — ED Provider Notes (Signed)
History     CSN: [id number]  Arrival date & time 02/18/12  2003   First MD Initiated Contact with Patient 02/18/12 2104      Chief Complaint  Patient presents with  . Shortness of Breath  . Sore Throat    (Consider location/radiation/quality/duration/timing/severity/associated sxs/prior treatment) HPI Complains of cough productive of white sputum and shortness of breath and sore throat onset approximately 2 hours ago no fever no treatment prior to coming here no other complaint. Sore throat is worse with swallowing not improved by anything , nonradiating no chest pain no other complaint Past Medical History  Diagnosis Date  . Hypertension   . Hyperlipidemia   . DM (diabetes mellitus)   . OSA on CPAP 01/19/2012   coronary disease  Past Surgical History  Procedure Date  . No past surgeries     Family History  Problem Relation Age of Onset  . Heart attack Father 11  . Heart attack Brother     History  Substance Use Topics  . Smoking status: Former Smoker -- 1.0 packs/day for 35 years    Quit date: 01/15/2012  . Smokeless tobacco: Never Used  . Alcohol Use: Yes     12 beers twice a week.     positive smoker  Review of Systems  Constitutional: Negative.   HENT: Negative.        Sore throat  Respiratory: Positive for cough and shortness of breath.   Cardiovascular: Negative.   Gastrointestinal: Negative.   Musculoskeletal: Negative.   Skin: Negative.   Neurological: Negative.   Hematological: Negative.   Psychiatric/Behavioral: Negative.   All other systems reviewed and are negative.    Allergies  Review of patient's allergies indicates no known allergies.  Home Medications   Current Outpatient Rx  Name Route Sig Dispense Refill  . ASPIRIN 81 MG PO CHEW Oral Chew 1 tablet (81 mg total) by mouth daily. 30 tablet 5  . OMEGA-3 FATTY ACIDS 1000 MG PO CAPS Oral Take 2 capsules (2 g total) by mouth daily. 6 capsule 0  . GLIPIZIDE 10 MG PO TABS Oral Take 1  tablet (10 mg total) by mouth daily. 30 tablet 0  . INSULIN ASPART 100 UNIT/ML [place] SOLN  Before each meal 3 times a day, 140-199 - 2 units, 200-251 - 4 units, 250-299 - 6 units, 300-349 - 8 units,  350 or above 10 units. 1 vial 1    3 day Supply  . LISINOPRIL 5 MG PO TABS Oral Take 5 mg by mouth daily.    Marland Kitchen METFORMIN HCL 1000 MG PO TABS Oral Take 1 tablet (1,000 mg total) by mouth 2 (two) times daily with a meal. RESUME On MONDAY 60 tablet 0  . METOPROLOL SUCCINATE 12.5 MG HALF TABLET Oral Take 0.5 tablets (12.5 mg total) by mouth daily. 10 tablet 0  . PIOGLITAZONE HCL 30 MG PO TABS Oral Take 1 tablet (30 mg total) by mouth daily. 3 day Supply 1 tablet 0  . PRASUGREL HCL 10 MG PO TABS Oral Take 1 tablet (10 mg total) by mouth daily. 30 tablet 5    BP 133/71  Pulse 83  Temp(Src) 98.8 F (37.1 C) (Oral)  Resp 20  Ht 5\' 6"  (1.676 m)  Wt 251 lb (113.853 kg)  BMI 40.51 kg/m2  SpO2 97%  Physical Exam  Nursing note and vitals reviewed. Constitutional: He appears well-developed and well-nourished.  HENT:  Head: Normocephalic and atraumatic.  Eyes: Conjunctivae are normal. Pupils are  equal, round, and reactive to light.  Neck: Neck supple. No tracheal deviation present. No thyromegaly present.  Cardiovascular: Normal rate and regular rhythm.   No murmur heard. Pulmonary/Chest: Effort normal and breath sounds normal.       Coughing frequently  Abdominal: Soft. Bowel sounds are normal. He exhibits no distension. There is no tenderness.  Musculoskeletal: Normal range of motion. He exhibits no edema and no tenderness.  Neurological: He is alert. Coordination normal.  Skin: Skin is warm and dry. No rash noted.  Psychiatric: He has a normal mood and affect.   Date: 02/18/2012  Rate: 85  Rhythm: normal sinus rhythm  QRS Axis: normal  Intervals: normal  ST/T Wave abnormalities: normal  Conduction Disutrbances: none  Narrative Interpretation: unremarkable   unchanged from 01/21/2012     ED Course  Procedures (including critical care time)  Labs Reviewed  POCT I-STAT, CHEM 8 - Abnormal; Notable for the following:    Glucose, Bld 246 (*)    All other components within normal limits  CARDIAC PANEL(CRET KIN+CKTOT+MB+TROPI)   Dg Chest Portable 1 View  02/18/2012  *RADIOLOGY REPORT*  Clinical Data: Shortness of breath  PORTABLE CHEST - 1 VIEW  Comparison: 01/18/2012  Findings: Stable cardiomegaly.  Increased interstitial markings with suspected mild superimposed interstitial edema.  No pleural effusion or pneumothorax.  IMPRESSION: Cardiomegaly with suspected mild interstitial edema.  Original Report Authenticated By: Charline Bills, M.D.   Results for orders placed during the hospital encounter of 02/18/12  CARDIAC PANEL(CRET KIN+CKTOT+MB+TROPI)      Component Value Range   Total CK 138  7 - 232 (U/L)   CK, MB 2.2  0.3 - 4.0 (ng/mL)   Troponin I <0.30  <0.30 (ng/mL)   Relative Index 1.6  0.0 - 2.5   POCT I-STAT, CHEM 8      Component Value Range   Sodium 141  135 - 145 (mEq/L)   Potassium 3.9  3.5 - 5.1 (mEq/L)   Chloride 103  96 - 112 (mEq/L)   BUN 10  6 - 23 (mg/dL)   Creatinine, Ser 1.61  0.50 - 1.35 (mg/dL)   Glucose, Bld 096 (*) 70 - 99 (mg/dL)   Calcium, Ion 0.45  4.09 - 1.32 (mmol/L)   TCO2 25  0 - 100 (mmol/L)   Hemoglobin 15.6  13.0 - 17.0 (g/dL)   HCT 81.1  91.4 - 78.2 (%)   Dg Chest Special View  02/18/2012  *RADIOLOGY REPORT*  Clinical Data: Shortness of breath  CHEST SPECIAL VIEW  Comparison: 02/17/2009 08:13  Findings: Lateral radiograph demonstrates interstitial prominence and mild multilevel degenerative changes.  Cardiomegaly.  No definite focal consolidation.  IMPRESSION: Cardiomegaly with suspected interstitial edema.  Original Report Authenticated By: Waneta Martins, M.D.   Dg Chest Portable 1 View  02/18/2012  *RADIOLOGY REPORT*  Clinical Data: Shortness of breath  PORTABLE CHEST - 1 VIEW  Comparison: 01/18/2012  Findings: Stable  cardiomegaly.  Increased interstitial markings with suspected mild superimposed interstitial edema.  No pleural effusion or pneumothorax.  IMPRESSION: Cardiomegaly with suspected mild interstitial edema.  Original Report Authenticated By: Charline Bills, M.D.    Patient states breathing normal after treatment with albuterol nebulized treatment on reexamination his lungs are clear he is no longer coughing.  No diagnosis found. X-rays reviewed by me . I advised patient of dangers of smoking and smoking cessation counseling performed for 5 minutes   MDM  Doubt congestive heart failure as etiology of dyspnea and cough Plan albuterol  HFA to go to use 2 puffs every 4 hours when necessary shortness of breath Tylenol for pain Diagnosis #1 acute bronchitis #2 hyperglycemia #3 tobacco abuse        Doug Sou, MD 02/19/12 0003

## 2012-02-18 NOTE — ED Notes (Signed)
[id number]<AV> Expected date:<BR> Expected time:<BR> Means of arrival:<BR> Comments:<BR> EMS 10 GC, 50 yom sob w throat pain

## 2012-02-19 MED ORDER — ALBUTEROL SULFATE HFA 108 (90 BASE) MCG/ACT IN AERS
2.0000 | INHALATION_SPRAY | RESPIRATORY_TRACT | Status: DC | PRN
  Filled 2012-02-19: qty 6.7

## 2012-02-19 NOTE — Discharge Instructions (Signed)
Bronchitis Blood sugar today was 262 which is mildly elevated. Use your inhaler 2 puffs every 4 hours as needed for cough or shortness of breath. Return if needed more than every 4 hours. Ask your Doctor to help you to stop smoking Bronchitis is a problem of the air tubes leading to your lungs. This problem makes it hard for air to get in and out of the lungs. You may cough a lot because your air tubes are narrow. Going without care can cause lasting (chronic) bronchitis. HOME CARE   Drink enough fluids to keep your pee (urine) clear or pale yellow.   Use a cool mist humidifier.   Quit smoking if you smoke. If you keep smoking, the bronchitis might not get better.   Only take medicine as told by your doctor.  GET HELP RIGHT AWAY IF:   Coughing keeps you awake.   You start to wheeze.   You become more sick or weak.   You have a hard time breathing or get short of breath.   You cough up blood.   Coughing lasts more than 2 weeks.   You have a fever.   Your baby is older than 3 months with a rectal temperature of 102 F (38.9 C) or higher.   Your baby is 18 months old or younger with a rectal temperature of 100.4 F (38 C) or higher.  MAKE SURE YOU:  Understand these instructions.   Will watch your condition.   Will get help right away if you are not doing well or get worse.  Document Released: 02/15/2008 Document Revised: 08/18/2011 Document Reviewed: 07/31/2009 [facility] Patient Information 2012 [place], [place].Bronchitis Bronchitis is a problem of the air tubes leading to your lungs. This problem makes it hard for air to get in and out of the lungs. You may cough a lot because your air tubes are narrow. Going without care can cause lasting (chronic) bronchitis. HOME CARE   Drink enough fluids to keep your pee (urine) clear or pale yellow.   Use a cool mist humidifier.   Quit smoking if you smoke. If you keep smoking, the bronchitis might not get better.   Only take  medicine as told by your doctor.  GET HELP RIGHT AWAY IF:   Coughing keeps you awake.   You start to wheeze.   You become more sick or weak.   You have a hard time breathing or get short of breath.   You cough up blood.   Coughing lasts more than 2 weeks.   You have a fever.   Your baby is older than 3 months with a rectal temperature of 102 F (38.9 C) or higher.   Your baby is 87 months old or younger with a rectal temperature of 100.4 F (38 C) or higher.  MAKE SURE YOU:  Understand these instructions.   Will watch your condition.   Will get help right away if you are not doing well or get worse.  Document Released: 02/15/2008 Document Revised: 08/18/2011 Document Reviewed: 07/31/2009 [facility] Patient Information 2012 [place], [place].

## 2012-05-03 ENCOUNTER — Encounter (HOSPITAL_COMMUNITY)
Admission: RE | Admit: 2012-05-03 | Discharge: 2012-05-03 | Disposition: A | Discharge status: Home / Self Care | Payer: Self-pay | Source: Ambulatory Visit | Attending: Cardiology | Admitting: Cardiology

## 2012-05-03 ENCOUNTER — Encounter (HOSPITAL_COMMUNITY): Payer: Self-pay

## 2012-05-03 DIAGNOSIS — E119 Type 2 diabetes mellitus without complications: Secondary | ICD-10-CM | POA: Insufficient documentation

## 2012-05-03 DIAGNOSIS — I10 Essential (primary) hypertension: Secondary | ICD-10-CM | POA: Insufficient documentation

## 2012-05-03 DIAGNOSIS — I059 Rheumatic mitral valve disease, unspecified: Secondary | ICD-10-CM | POA: Insufficient documentation

## 2012-05-03 DIAGNOSIS — E781 Pure hyperglyceridemia: Secondary | ICD-10-CM | POA: Insufficient documentation

## 2012-05-03 DIAGNOSIS — Z5189 Encounter for other specified aftercare: Secondary | ICD-10-CM | POA: Insufficient documentation

## 2012-05-03 DIAGNOSIS — R079 Chest pain, unspecified: Secondary | ICD-10-CM | POA: Insufficient documentation

## 2012-05-07 ENCOUNTER — Encounter (HOSPITAL_COMMUNITY): Payer: Self-pay

## 2012-05-07 ENCOUNTER — Encounter (HOSPITAL_COMMUNITY)
Admission: RE | Admit: 2012-05-07 | Discharge: 2012-05-07 | Disposition: A | Discharge status: Home / Self Care | Payer: Self-pay | Source: Ambulatory Visit | Attending: Cardiology | Admitting: Cardiology

## 2012-05-07 NOTE — Progress Notes (Signed)
Pt started cardiac rehab today.  Pt tolerated light exercise without difficulty.  Asymptomatic.  VSS, telemetry-NSR.  Pt oriented to exercise equipment and routine.  Understanding verbalized.   Pt states he is currently out of metoprolol and is close to being out of lisinopril.  Pt PCP was [facility] who also provided some medications for him.  [facility] offers both these medications on the $4.00 list or 90 day supply for $10.  At pt request, Dr Anne Fu office notified to call in prescription of these 2 meds to [facility] [place] because it is on the busline and convenient to his home.  Pt instructed to pick up meds ASAP when ready.   Pt also is unsure of the name of his insulin, however states he only takes "30 units" at bedtime.  Pt instructed to bring insulin bottle with him to cardiac rehab next visit.  Understanding verbalized

## 2012-05-08 LAB — GLUCOSE, CAPILLARY: Glucose-Capillary: 253 mg/dL — ABNORMAL HIGH (ref 70–99)

## 2012-05-09 ENCOUNTER — Encounter (HOSPITAL_COMMUNITY)
Admission: RE | Admit: 2012-05-09 | Discharge: 2012-05-09 | Disposition: A | Discharge status: Home / Self Care | Payer: Self-pay | Source: Ambulatory Visit | Attending: Cardiology | Admitting: Cardiology

## 2012-05-09 LAB — GLUCOSE, CAPILLARY: Glucose-Capillary: 220 mg/dL — ABNORMAL HIGH (ref 70–99)

## 2012-05-09 NOTE — Progress Notes (Signed)
Pt brought his insulin bottles with him today.  Med list reconciled.  Pt states he does not take Novalog as he does not have anyone to administer to him during the day.  However he does take lantus qhs.  Spoke to [place], Dr. Judd Gaudier office.  She has prescription from Dr. Anne Fu and she will call rx into [facility] [place] and inform pt.

## 2012-05-11 ENCOUNTER — Encounter (HOSPITAL_COMMUNITY): Payer: Self-pay

## 2012-05-16 ENCOUNTER — Encounter (HOSPITAL_COMMUNITY)
Admission: RE | Admit: 2012-05-16 | Discharge: 2012-05-16 | Disposition: A | Discharge status: Home / Self Care | Payer: Self-pay | Source: Ambulatory Visit | Attending: Cardiology | Admitting: Cardiology

## 2012-05-16 DIAGNOSIS — I059 Rheumatic mitral valve disease, unspecified: Secondary | ICD-10-CM | POA: Insufficient documentation

## 2012-05-16 DIAGNOSIS — R079 Chest pain, unspecified: Secondary | ICD-10-CM | POA: Insufficient documentation

## 2012-05-16 DIAGNOSIS — E781 Pure hyperglyceridemia: Secondary | ICD-10-CM | POA: Insufficient documentation

## 2012-05-16 DIAGNOSIS — I10 Essential (primary) hypertension: Secondary | ICD-10-CM | POA: Insufficient documentation

## 2012-05-16 DIAGNOSIS — Z5189 Encounter for other specified aftercare: Secondary | ICD-10-CM | POA: Insufficient documentation

## 2012-05-16 LAB — GLUCOSE, CAPILLARY: Glucose-Capillary: 164 mg/dL — ABNORMAL HIGH (ref 70–99)

## 2012-05-16 NOTE — Progress Notes (Signed)
Nutrition Note Spoke with pt. Pt was seen at [facility]. Per discussion with pt, pt did not follow-up with [facility] for his 3 month appt because "I thought I was going to be able to get a job." Pt currently working on getting disability. Pt unable to check CBG's due to limited financial resources and not having a glucometer/test strips. Pt taking Lantus 30 units q bedtime. Insulin is being administered by pt's roommate because pt does not have the confidence to inject himself. Pt encouraged to bring his Novolog into rehab so the staff can work with pt on becoming self-sufficient with insulin injections. Pt's A1c of 13.6 discussed. Pt reports he ran out of medicine and so his blood sugar was not controlled. Pt states he understands the importance of managing his CBG's. Pt receives $200/ mo in food stamps. Pt given food resources available in [place]. Continue client-centered nutrition education by RD as part of interdisciplinary care.  Monitor and evaluate progress toward nutrition goal with team.

## 2012-05-18 ENCOUNTER — Encounter (HOSPITAL_COMMUNITY): Payer: Self-pay

## 2012-05-21 ENCOUNTER — Encounter (HOSPITAL_COMMUNITY)
Admission: RE | Admit: 2012-05-21 | Discharge: 2012-05-21 | Disposition: A | Discharge status: Home / Self Care | Payer: Self-pay | Source: Ambulatory Visit | Attending: Cardiology | Admitting: Cardiology

## 2012-05-21 LAB — GLUCOSE, CAPILLARY
Glucose-Capillary: 159 mg/dL — ABNORMAL HIGH (ref 70–99)
Glucose-Capillary: 143 mg/dL — ABNORMAL HIGH (ref 70–99)

## 2012-05-23 ENCOUNTER — Encounter (HOSPITAL_COMMUNITY)
Admission: RE | Admit: 2012-05-23 | Discharge: 2012-05-23 | Disposition: A | Discharge status: Home / Self Care | Payer: Self-pay | Source: Ambulatory Visit | Attending: Cardiology | Admitting: Cardiology

## 2012-05-23 NOTE — Progress Notes (Signed)
Nutrition Education Spoke with pt. Pt educated re: how to use a glucometer including hand hygiene prior to glucometer use. This Clinical research associate demonstrated glucometer use x 1. Pt able to put a test strip in the glucometer, prick his finger, get a blood sample to the glucometer and dispose of bio-hazardous waste. Pt did very well with replicating demonstration. Pt CBG 204 mg/dL and 161 mg/dL within 2 hours after eating a ham and cheese sub and a diet coke. Pt showed understanding of glucometer use and expressed confidence in being able to check CBG's regularly. Pt's "inability to stick himself" with a needle discussed. Given pt's ease of using the pen lancet device, feel pt would likely be compliant with taking insulin if insulin switched to pen form. Continue client-centered nutrition education by RD as part of interdisciplinary care.  Monitor and evaluate progress toward nutrition goal with team.

## 2012-05-23 NOTE — Progress Notes (Signed)
Pt arrived at cardiac rehab with his medication bottles.  Pt is states he is currently taking metformin once daily and only has 8 tablets left. Pt takes glucotrol daily with 40 tabs left and lipitor 30-40 tabs left.  PC to Dr. Anne Fu nurse to request refill of metformin.  Also, left message with [facility] [place] to establish care.  Pt has received dismissal letter from Dr. Judd Gaudier office for inadequate payment.  Spoke to Ms. Church, Print production planner, [facility].    New pt appt scheduled with [name] 06/05/12 3:30.  Pt verbalized understanding.  Pt request medical records to take with him to disability hearing on 05/30/2012.  Release of medical record forms faxed to [facility] records and [facility] records.   Per Pam at [place], records are ready for pt to pick up at front desk.  After discussion with pt about dismissal letter, pt request I contact his mother to inform her of the situation.  Spoke to Renal, [phone] to update patient outpatient medical needs.

## 2012-05-25 ENCOUNTER — Encounter (HOSPITAL_COMMUNITY): Payer: Self-pay

## 2012-05-28 ENCOUNTER — Encounter (HOSPITAL_COMMUNITY)
Admission: RE | Admit: 2012-05-28 | Discharge: 2012-05-28 | Disposition: A | Discharge status: Home / Self Care | Payer: Self-pay | Source: Ambulatory Visit | Attending: Cardiology | Admitting: Cardiology

## 2012-05-28 NOTE — Progress Notes (Signed)
Pt given rehab report at his request to take to disability meeting.  Pt informed records are ready to pick up at [facility] and [facility] medical records.   Understanding verbalized

## 2012-05-28 NOTE — Progress Notes (Signed)
Reviewed home exercise with pt today.  Pt plans to walk at home and school for exercise.  Reviewed THR, pulse (needs practice), RPE, sign and symptoms, NTG use, and when to call 911 or MD.  Pt voiced understanding. Fabio Pierce, MA, ACSM RCEP

## 2012-05-30 ENCOUNTER — Encounter (HOSPITAL_COMMUNITY): Payer: Self-pay

## 2012-06-01 ENCOUNTER — Encounter (HOSPITAL_COMMUNITY): Payer: Self-pay

## 2012-06-04 ENCOUNTER — Encounter (HOSPITAL_COMMUNITY)
Admission: RE | Admit: 2012-06-04 | Discharge: 2012-06-04 | Disposition: A | Discharge status: Home / Self Care | Payer: Self-pay | Source: Ambulatory Visit | Attending: Cardiology | Admitting: Cardiology

## 2012-06-04 LAB — GLUCOSE, CAPILLARY: Glucose-Capillary: 205 mg/dL — ABNORMAL HIGH (ref 70–99)

## 2012-06-04 NOTE — Progress Notes (Signed)
[name] [name] has an appointment with Dr Swaziland tomorrow. Will fax exercise flow sheets to Dr. Elvis Coil office for review

## 2012-06-04 NOTE — Progress Notes (Signed)
Nutrition Note Pt approved by [facility] to establish PCP. This Clinical research associate spoke with [name] [name] (ext [phone]) re: setting up an appt. [facility] to contact pt. If [facility] is unable to contact pt via phone, they will call Cardiac and Pulmonary Rehab on Mon or Wed from 2:45-4 pm. Information relayed to pt. Pt expressed understanding. Continue client-centered nutrition education by RD as part of interdisciplinary care.  Monitor and evaluate progress toward nutrition goal with team.

## 2012-06-05 ENCOUNTER — Ambulatory Visit (INDEPENDENT_AMBULATORY_CARE_PROVIDER_SITE_OTHER): Payer: Self-pay | Admitting: Cardiology

## 2012-06-05 ENCOUNTER — Encounter: Payer: Self-pay | Admitting: Cardiology

## 2012-06-05 VITALS — BP 132/66 | HR 59 | Resp 18 | Ht 67.0 in | Wt 267.8 lb

## 2012-06-05 DIAGNOSIS — I2510 Atherosclerotic heart disease of native coronary artery without angina pectoris: Secondary | ICD-10-CM

## 2012-06-05 DIAGNOSIS — E1165 Type 2 diabetes mellitus with hyperglycemia: Secondary | ICD-10-CM | POA: Diagnosis present

## 2012-06-05 DIAGNOSIS — R0989 Other specified symptoms and signs involving the circulatory and respiratory systems: Secondary | ICD-10-CM

## 2012-06-05 DIAGNOSIS — R0600 Dyspnea, unspecified: Secondary | ICD-10-CM | POA: Insufficient documentation

## 2012-06-05 DIAGNOSIS — E119 Type 2 diabetes mellitus without complications: Secondary | ICD-10-CM

## 2012-06-05 DIAGNOSIS — I10 Essential (primary) hypertension: Secondary | ICD-10-CM

## 2012-06-05 DIAGNOSIS — Z794 Long term (current) use of insulin: Secondary | ICD-10-CM | POA: Diagnosis present

## 2012-06-05 DIAGNOSIS — E118 Type 2 diabetes mellitus with unspecified complications: Secondary | ICD-10-CM | POA: Diagnosis present

## 2012-06-05 NOTE — Progress Notes (Signed)
[name] [name] Date of Birth: 08-Sep-1961 Medical Record #[id number]  History of Present Illness: [name] [name] is seen today to establish cardiac care. He is a former patient of [facility] cardiology. He is a 51 year old white male who was admitted in May of this year with unstable angina. He had an abnormal Myoview study which led to cardiac catheterization. He had a thrombotic lesion in the mid RCA that was stented with a bare-metal stent. This was postdilated to 4 mm. He has been maintained on aspirin and Effient. He denies any recurrent chest, jaw, or arm pain. He does complain of shortness of breath that has been present really ever since his procedure. It is worse with exertion. He has a mild nonproductive cough. He does have a history of sleep apnea and is on CPAP therapy. He has diabetes which has been poorly controlled. He currently does not have a primary care physician since [facility] closed.  Current Outpatient Prescriptions on File Prior to Visit  Medication Sig Dispense Refill  . aspirin 81 MG chewable tablet Chew 1 tablet (81 mg total) by mouth daily.  30 tablet  5  . atenolol (TENORMIN) 25 MG tablet Take 25 mg by mouth daily.      Marland Kitchen atorvastatin (LIPITOR) 40 MG tablet Take 40 mg by mouth daily.      . fish oil-omega-3 fatty acids 1000 MG capsule Take 1 g by mouth 2 (two) times daily.      Marland Kitchen glipiZIDE (GLUCOTROL) 10 MG tablet Take 1 tablet (10 mg total) by mouth daily.  30 tablet  0  . insulin aspart (NOVOLOG) 100 UNIT/ML injection Before each meal 3 times a day, 140-199 - 2 units, 200-251 - 4 units, 250-299 - 6 units, 300-349 - 8 units,  350 or above 10 units.  1 vial  1  . insulin glargine (LANTUS) 100 UNIT/ML injection Inject 30 Units into the skin at bedtime.      Marland Kitchen lisinopril (PRINIVIL,ZESTRIL) 5 MG tablet Take 2.5 mg by mouth 2 (two) times daily.       . metFORMIN (GLUCOPHAGE) 1000 MG tablet Take 1 tablet (1,000 mg total) by mouth 2 (two) times daily with a meal. RESUME On MONDAY  60  tablet  0  . prasugrel (EFFIENT) 10 MG TABS Take 1 tablet (10 mg total) by mouth daily.  30 tablet  5  . pioglitazone (ACTOS) 30 MG tablet Take 1 tablet (30 mg total) by mouth daily. 3 day Supply  1 tablet  0    No Known Allergies  Past Medical History  Diagnosis Date  . Hypertension   . Hyperlipidemia   . DM (diabetes mellitus)   . OSA on CPAP 01/19/2012  . CAD (coronary artery disease) 01/18/12    BMS to RCA    Past Surgical History  Procedure Date  . No past surgeries     History  Smoking status  . Current Some Day Smoker -- 0.5 packs/day for 35 years  . Types: Cigarettes  . Last Attempt to Quit: 01/15/2012  Smokeless tobacco  . Never Used    History  Alcohol Use  . Yes    12 beers twice a week.    Family History  Problem Relation Age of Onset  . Heart attack Father 14  . Heart attack Brother     Review of Systems: The review of systems is positive for leg weakness. He complains of a backache. He is still smoking.  All other systems were reviewed and  are negative.  Physical Exam: BP 132/66  Pulse 59  Resp 18  Ht 5\' 7"  (1.702 m)  Wt 267 lb 12.8 oz (121.473 kg)  BMI 41.94 kg/m2  SpO2 97% He is an obese white male in no acute distress. The patient is alert and oriented x 3.  The mood and affect are normal.  The skin is warm and dry.  Color is normal.  The HEENT exam reveals that the sclera are nonicteric.  The mucous membranes are moist.  The carotids are 2+ without bruits.  There is no thyromegaly.  There is no JVD.  The lungs are clear.  The chest wall is non tender.  The heart exam reveals a regular rate with a normal S1 and S2.  There are no murmurs, gallops, or rubs.  The PMI is not displaced.   Abdominal exam reveals good bowel sounds. It is morbidly obese. There is no hepatosplenomegaly or tenderness.  There are no masses.  Exam of the legs reveal no clubbing, cyanosis, or edema.  The legs are without rashes.  The distal pulses are intact.  Cranial nerves II -  XII are intact.  Motor and sensory functions are intact.  The gait is normal. LABORATORY DATA: ECG today demonstrates normal sinus rhythm with a normal ECG.  Assessment / Plan: 1. Coronary disease status post bare-metal stent of the RCA in May of 2013. Ideally we would continue dual antiplatelet therapy for one year. If cost becomes a prohibitive factor we can discontinue his Effient. We did give him some samples today. He has no recurrent angina. We will continue with his beta blocker, ACE inhibitor, and statin therapy.  2. Hypertension, well controlled. Blood pressure readings at rehabilitation were reviewed.  3. Diabetes mellitus, poorly controlled. I have refilled his metformin prescription until he can establish with a new primary care.  4. Hyperlipidemia.  5. Dyspnea. I think this is related to his morbid obesity and ongoing smoking. He does have obstructive sleep apnea. I recommended smoking cessation and weight loss. I recommended regular aerobic exercise. We will obtain an echocardiogram to evaluate left ventricular and right ventricular function.

## 2012-06-05 NOTE — Patient Instructions (Signed)
Continue your current medications and efforts at Rehab  Try and lose weight.  Quit smoking!!!  We will schedule you for an Echocardiogram  I will see you again in 6 months.

## 2012-06-06 ENCOUNTER — Encounter (HOSPITAL_COMMUNITY)
Admission: RE | Admit: 2012-06-06 | Discharge: 2012-06-06 | Disposition: A | Discharge status: Home / Self Care | Payer: Self-pay | Source: Ambulatory Visit | Attending: Cardiology | Admitting: Cardiology

## 2012-06-06 ENCOUNTER — Telehealth: Payer: Self-pay

## 2012-06-06 NOTE — Telephone Encounter (Signed)
Received a call from [facility] in cardiac rehab wanting to know if Norma Fredrickson NP could write patient prescriptions for actos,metformin,for patient take to maps program.Metformin 1000 mg twice daily # 60 no refills and actos 30 mg daily # 30 no refills.Prescriptions left at front desk 3rd floor,patient to pick up.

## 2012-06-06 NOTE — Progress Notes (Signed)
Alyan blood sugar was 341 this afternoon. Urine negative for ketones.  Jevon was drinking a large sweet tea from [employer] upon arrival.  No exercise today per protocol.  Jerl was given water to drink. Patient to take his insulin when he gets home. Donyea has an appointment with [facility] on June 14, 2012.  Ruven Maheu to be more careful with with his food choices and to avoid sweets. Patient states understanding. Dr Elvis Coil office called and notified about CBG. Will fax exercise flow sheets to Dr. Elvis Coil office for review.

## 2012-06-07 ENCOUNTER — Telehealth (HOSPITAL_COMMUNITY): Payer: Self-pay | Admitting: *Deleted

## 2012-06-07 NOTE — Progress Notes (Signed)
Late entry. Pt seen 06/06/12 Spoke with pt. Paperwork to establish a PCP completed with pt. This Clinical research associate called [facility] and scheduled pt appt for Thursday 10/3 at 3 pm. [facility] information including Frequently Asked Questions reviewed with pt. Pt is aware that if he misses his first appt he will not be rescheduled. Continue client-centered nutrition education by RD as part of interdisciplinary care.  Monitor and evaluate progress toward nutrition goal with team.

## 2012-06-08 ENCOUNTER — Encounter (HOSPITAL_COMMUNITY): Payer: Self-pay

## 2012-06-11 ENCOUNTER — Encounter (HOSPITAL_COMMUNITY)
Admission: RE | Admit: 2012-06-11 | Discharge: 2012-06-11 | Disposition: A | Discharge status: Home / Self Care | Payer: Self-pay | Source: Ambulatory Visit | Attending: Cardiology | Admitting: Cardiology

## 2012-06-12 ENCOUNTER — Other Ambulatory Visit: Payer: Self-pay

## 2012-06-12 ENCOUNTER — Ambulatory Visit (HOSPITAL_COMMUNITY): Payer: Self-pay | Attending: Cardiology

## 2012-06-12 DIAGNOSIS — I059 Rheumatic mitral valve disease, unspecified: Secondary | ICD-10-CM | POA: Insufficient documentation

## 2012-06-12 DIAGNOSIS — R0609 Other forms of dyspnea: Secondary | ICD-10-CM | POA: Insufficient documentation

## 2012-06-12 DIAGNOSIS — I10 Essential (primary) hypertension: Secondary | ICD-10-CM | POA: Insufficient documentation

## 2012-06-12 DIAGNOSIS — I369 Nonrheumatic tricuspid valve disorder, unspecified: Secondary | ICD-10-CM | POA: Insufficient documentation

## 2012-06-12 DIAGNOSIS — I2510 Atherosclerotic heart disease of native coronary artery without angina pectoris: Secondary | ICD-10-CM | POA: Insufficient documentation

## 2012-06-12 DIAGNOSIS — F17200 Nicotine dependence, unspecified, uncomplicated: Secondary | ICD-10-CM | POA: Insufficient documentation

## 2012-06-12 DIAGNOSIS — R0989 Other specified symptoms and signs involving the circulatory and respiratory systems: Secondary | ICD-10-CM | POA: Insufficient documentation

## 2012-06-12 DIAGNOSIS — E119 Type 2 diabetes mellitus without complications: Secondary | ICD-10-CM | POA: Insufficient documentation

## 2012-06-12 DIAGNOSIS — R0600 Dyspnea, unspecified: Secondary | ICD-10-CM

## 2012-06-13 ENCOUNTER — Encounter (HOSPITAL_COMMUNITY)
Admission: RE | Admit: 2012-06-13 | Discharge: 2012-06-13 | Disposition: A | Discharge status: Home / Self Care | Payer: Self-pay | Source: Ambulatory Visit | Attending: Cardiology | Admitting: Cardiology

## 2012-06-13 ENCOUNTER — Telehealth: Payer: Self-pay | Admitting: Cardiology

## 2012-06-13 DIAGNOSIS — R079 Chest pain, unspecified: Secondary | ICD-10-CM | POA: Insufficient documentation

## 2012-06-13 DIAGNOSIS — Z5189 Encounter for other specified aftercare: Secondary | ICD-10-CM | POA: Insufficient documentation

## 2012-06-13 DIAGNOSIS — I059 Rheumatic mitral valve disease, unspecified: Secondary | ICD-10-CM | POA: Insufficient documentation

## 2012-06-13 DIAGNOSIS — I10 Essential (primary) hypertension: Secondary | ICD-10-CM | POA: Insufficient documentation

## 2012-06-13 DIAGNOSIS — E781 Pure hyperglyceridemia: Secondary | ICD-10-CM | POA: Insufficient documentation

## 2012-06-13 NOTE — Telephone Encounter (Signed)
Pt was told by Adrian Prince the echo tech to call today for his results of echo from yesterday

## 2012-06-13 NOTE — Telephone Encounter (Signed)
Patient called was told echo normal.Patient requested a copy of echo.Copy left at 3 rd floor front desk.

## 2012-06-14 ENCOUNTER — Ambulatory Visit: Payer: Self-pay | Admitting: Family Medicine

## 2012-06-14 NOTE — Progress Notes (Signed)
Echocardiogram performed on Tuesday Jun 12, 2012.

## 2012-06-15 ENCOUNTER — Encounter (HOSPITAL_COMMUNITY): Payer: Self-pay

## 2012-06-18 ENCOUNTER — Encounter (HOSPITAL_COMMUNITY): Admission: RE | Admit: 2012-06-18 | Payer: Self-pay | Source: Ambulatory Visit

## 2012-06-18 ENCOUNTER — Encounter (HOSPITAL_COMMUNITY): Payer: Self-pay

## 2012-06-18 ENCOUNTER — Emergency Department (HOSPITAL_COMMUNITY)
Admission: EM | Admit: 2012-06-18 | Discharge: 2012-06-19 | Disposition: A | Discharge status: Home / Self Care | Payer: Self-pay | Attending: Emergency Medicine | Admitting: Emergency Medicine

## 2012-06-18 DIAGNOSIS — Z794 Long term (current) use of insulin: Secondary | ICD-10-CM | POA: Insufficient documentation

## 2012-06-18 DIAGNOSIS — E1142 Type 2 diabetes mellitus with diabetic polyneuropathy: Secondary | ICD-10-CM | POA: Insufficient documentation

## 2012-06-18 DIAGNOSIS — S91309A Unspecified open wound, unspecified foot, initial encounter: Secondary | ICD-10-CM | POA: Insufficient documentation

## 2012-06-18 DIAGNOSIS — F17200 Nicotine dependence, unspecified, uncomplicated: Secondary | ICD-10-CM | POA: Insufficient documentation

## 2012-06-18 DIAGNOSIS — G4733 Obstructive sleep apnea (adult) (pediatric): Secondary | ICD-10-CM | POA: Insufficient documentation

## 2012-06-18 DIAGNOSIS — M79609 Pain in unspecified limb: Secondary | ICD-10-CM | POA: Insufficient documentation

## 2012-06-18 DIAGNOSIS — R209 Unspecified disturbances of skin sensation: Secondary | ICD-10-CM | POA: Insufficient documentation

## 2012-06-18 DIAGNOSIS — I10 Essential (primary) hypertension: Secondary | ICD-10-CM | POA: Insufficient documentation

## 2012-06-18 DIAGNOSIS — L02619 Cutaneous abscess of unspecified foot: Secondary | ICD-10-CM | POA: Insufficient documentation

## 2012-06-18 DIAGNOSIS — E1149 Type 2 diabetes mellitus with other diabetic neurological complication: Secondary | ICD-10-CM | POA: Insufficient documentation

## 2012-06-18 DIAGNOSIS — I2510 Atherosclerotic heart disease of native coronary artery without angina pectoris: Secondary | ICD-10-CM | POA: Insufficient documentation

## 2012-06-18 DIAGNOSIS — Z79899 Other long term (current) drug therapy: Secondary | ICD-10-CM | POA: Insufficient documentation

## 2012-06-18 DIAGNOSIS — E785 Hyperlipidemia, unspecified: Secondary | ICD-10-CM | POA: Insufficient documentation

## 2012-06-18 NOTE — ED Notes (Signed)
Patient reports that he had an injury a year ago to right lateral foot and this past week. Patient has slight swelling and redness to the right foot.

## 2012-06-19 ENCOUNTER — Emergency Department (HOSPITAL_COMMUNITY): Payer: Self-pay

## 2012-06-19 LAB — CBC WITH DIFFERENTIAL/PLATELET
Hemoglobin: 15.3 g/dL (ref 13.0–17.0)
Lymphs Abs: 4.2 10*3/uL — ABNORMAL HIGH (ref 0.7–4.0)
Monocytes Relative: 6 % (ref 3–12)
Neutro Abs: 5.6 10*3/uL (ref 1.7–7.7)
Neutrophils Relative %: 51 % (ref 43–77)
RBC: 4.91 MIL/uL (ref 4.22–5.81)
WBC: 10.9 10*3/uL — ABNORMAL HIGH (ref 4.0–10.5)

## 2012-06-19 LAB — COMPREHENSIVE METABOLIC PANEL
AST: 15 U/L (ref 0–37)
CO2: 23 mEq/L (ref 19–32)
Calcium: 9.1 mg/dL (ref 8.4–10.5)
Creatinine, Ser: 0.67 mg/dL (ref 0.50–1.35)
GFR calc Af Amer: >90 mL/min (ref >90)
GFR calc non Af Amer: >90 mL/min (ref >90)
Glucose, Bld: 219 mg/dL — ABNORMAL HIGH (ref 70–99)

## 2012-06-19 MED ORDER — HYDROCODONE-ACETAMINOPHEN 5-325 MG PO TABS: 1.0000 | ORAL_TABLET | ORAL | Status: DC | PRN

## 2012-06-19 MED ORDER — CIPROFLOXACIN HCL 500 MG PO TABS
500.0000 mg | ORAL_TABLET | Freq: Once | ORAL | Status: AC
  Administered 2012-06-19: 500 mg via ORAL
  Filled 2012-06-19: qty 1

## 2012-06-19 MED ORDER — OXYCODONE-ACETAMINOPHEN 5-325 MG PO TABS
1.0000 | ORAL_TABLET | Freq: Once | ORAL | Status: AC
  Administered 2012-06-19: 1 via ORAL
  Filled 2012-06-19: qty 1

## 2012-06-19 MED ORDER — CIPROFLOXACIN HCL 500 MG PO TABS: 500.0000 mg | ORAL_TABLET | Freq: Two times a day (BID) | ORAL | Status: DC

## 2012-06-19 MED ORDER — BACITRACIN ZINC 500 UNIT/GM EX OINT
TOPICAL_OINTMENT | CUTANEOUS | Status: AC
  Administered 2012-06-19: 04:00:00
  Filled 2012-06-19: qty 0.9

## 2012-06-19 NOTE — ED Provider Notes (Signed)
History     CSN: [id number]  Arrival date & time 06/18/12  1755   First MD Initiated Contact with Patient 06/19/12 0138      Chief Complaint  Patient presents with  . Foot Pain  . Wound Infection    (Consider location/radiation/quality/duration/timing/severity/associated sxs/prior treatment) Patient is a 51 y.o. male presenting with lower extremity pain. The history is provided by the patient.  Foot Pain This is a new problem. The current episode started 1 to 4 weeks ago. Associated symptoms include numbness. Pertinent negatives include no chest pain, fever or joint swelling. Associated symptoms comments: He reports being diabetic with some neuropathy in feet, but with increasing pain in the right foot. He has an old injury that has caused swelling over the last one week. No fever. No redness. He reports no drainage from a wound along the side of the foot..    Past Medical History  Diagnosis Date  . Hypertension   . Hyperlipidemia   . DM (diabetes mellitus)   . OSA on CPAP 01/19/2012  . CAD (coronary artery disease) 01/18/12    BMS to RCA    Past Surgical History  Procedure Date  . No past surgeries   . Coronary stent placement     Family History  Problem Relation Age of Onset  . Heart attack Father 65  . Heart attack Brother     History  Substance Use Topics  . Smoking status: Current Some Day Smoker -- 0.5 packs/day for 35 years    Types: Cigarettes  . Smokeless tobacco: Never Used  . Alcohol Use: Yes     12 beers twice a week.      Review of Systems  Constitutional: Negative for fever.  Respiratory: Negative for shortness of breath.   Cardiovascular: Negative for chest pain.  Musculoskeletal: Negative for joint swelling.       See HPI.  Skin: Positive for wound.  Neurological: Positive for numbness.    Allergies  Review of patient's allergies indicates no known allergies.  Home Medications   Current Outpatient Rx  Name Route Sig Dispense Refill  .  ASPIRIN 81 MG PO CHEW Oral Chew 1 tablet (81 mg total) by mouth daily. 30 tablet 5  . ATENOLOL 25 MG PO TABS Oral Take 25 mg by mouth daily.    . ATORVASTATIN CALCIUM 40 MG PO TABS Oral Take 40 mg by mouth daily.    . OMEGA-3 FATTY ACIDS 1000 MG PO CAPS Oral Take 1 g by mouth 2 (two) times daily.    Marland Kitchen GLIPIZIDE 10 MG PO TABS Oral Take 1 tablet (10 mg total) by mouth daily. 30 tablet 0  . INSULIN ASPART 100 UNIT/ML [place] SOLN Subcutaneous Inject 30 Units into the skin 3 (three) times daily before meals. Before each meal 3 times a day, 140-199 - 2 units, 200-251 - 4 units, 250-299 - 6 units, 300-349 - 8 units,  350 or above 10 units.    . INSULIN GLARGINE 100 UNIT/ML [place] SOLN Subcutaneous Inject 30 Units into the skin at bedtime.    Marland Kitchen LISINOPRIL 5 MG PO TABS Oral Take 2.5 mg by mouth 2 (two) times daily.     Marland Kitchen METFORMIN HCL 1000 MG PO TABS Oral Take 1 tablet (1,000 mg total) by mouth 2 (two) times daily with a meal. RESUME On MONDAY 60 tablet 0  . PRASUGREL HCL 10 MG PO TABS Oral Take 1 tablet (10 mg total) by mouth daily. 30 tablet 5  BP 129/60  Pulse 84  Temp 98.4 F (36.9 C) (Oral)  Resp 16  SpO2 94%  Physical Exam  Constitutional: He is oriented to person, place, and time. He appears well-developed and well-nourished.  Neck: Normal range of motion.  Pulmonary/Chest: Effort normal.  Musculoskeletal: Normal range of motion.       Right foot markedly swollen without redness or warmth. There is a wound along the lateral foot that is healing well. No drainage. Wound is dry.   Neurological: He is alert and oriented to person, place, and time.  Skin: Skin is warm and dry.  Psychiatric: He has a normal mood and affect.    ED Course  Procedures (including critical care time)  Labs Reviewed  COMPREHENSIVE METABOLIC PANEL - Abnormal; Notable for the following:    Sodium 134 (*)     Glucose, Bld 219 (*)     Alkaline Phosphatase 147 (*)     All other components within normal limits  CBC  WITH DIFFERENTIAL - Abnormal; Notable for the following:    WBC 10.9 (*)     Lymphs Abs 4.2 (*)     All other components within normal limits  URINALYSIS, ROUTINE W REFLEX MICROSCOPIC   Dg Foot Complete Left  06/19/2012  *RADIOLOGY REPORT*  Clinical Data: 51 year old male with diabetes.  Foot swelling, pain and ulcers.  LEFT FOOT - COMPLETE 3+ VIEW  Comparison: None.  Findings: Bone mineralization is within normal limits.  Calcaneus intact.  Accessory or chronic ossicle adjacent to the cuboid.  No subcutaneous gas.  No fracture or dislocation.  No cortical osteolysis.  IMPRESSION: No acute osseous abnormality identified about the left foot.   Original Report Authenticated By: Harley Hallmark, M.D.    Dg Foot Complete Right  06/19/2012  *RADIOLOGY REPORT*  Clinical Data: 51 year old male with diabetes, foot swelling, pain and ulcers.  RIGHT FOOT COMPLETE - 3+ VIEW  Comparison: 05/09/2011.  Findings: Bone mineralization is within normal limits.  Calcaneus stable and intact.  Accessory ossicle lateral to the cuboid and calcaneus is unchanged.  No fracture, dislocation, or cortical osteolysis.  No subcutaneous gas.  IMPRESSION: No acute osseous abnormality identified about the right foot.   Original Report Authenticated By: Harley Hallmark, M.D.      No diagnosis found. 1. Foot wound   MDM  Dr. Freida Busman in to see patient. No evidence of advanced cellulitis without leukocytosis, redness warmth or drainage. No fever. Will treat with oral abx and close recheck in 24 hours in ED.         Rodena Medin, PA-C 06/19/12 [phone]

## 2012-06-19 NOTE — ED Provider Notes (Signed)
Medical screening examination/treatment/procedure(s) were performed by non-physician practitioner and as supervising physician I was immediately available for consultation/collaboration.  Right foot with early cellulitis, will tx and have pt return in 24 hours for a wound check  Toy Baker, MD 06/19/12 [phone]

## 2012-06-19 NOTE — ED Provider Notes (Signed)
Medical screening examination/treatment/procedure(s) were conducted as a shared visit with non-physician practitioner(s) and myself.  I personally evaluated the patient during the encounter  Joelee Snoke T Etana Beets, MD 06/19/12 0706 

## 2012-06-19 NOTE — ED Notes (Signed)
Pt. Was notified that a UA was needed but pt. Stated that he was unable to  Void at this time

## 2012-06-20 ENCOUNTER — Encounter (HOSPITAL_COMMUNITY)
Admission: RE | Admit: 2012-06-20 | Discharge: 2012-06-20 | Disposition: A | Discharge status: Home / Self Care | Payer: Self-pay | Source: Ambulatory Visit | Attending: Cardiology | Admitting: Cardiology

## 2012-06-20 NOTE — Progress Notes (Signed)
Pt returned to cardiac rehab today after being absent for ED evaluation of right foot pain.  Pt states he was treated and released, given rx for cipro and norco.  Pt states he has not picked up these medications from pharmacy but is planning to go tomorrow.  Pt denies pain upon arrival but did complain of some discomfort with walking track. Pt stopped walking and used armcrank without discomfort.  Pt states he was advised to f/u in [facility] ED on Monday 06/25/12 even though physician notes states within 24 hours.  Pt also states he has not had appt to establish care with PCP at [facility].  Pt states he did not have the $20 copay.  Pt r/s that appt for 07/12/12.

## 2012-06-22 ENCOUNTER — Encounter (HOSPITAL_COMMUNITY): Payer: Self-pay

## 2012-06-25 ENCOUNTER — Telehealth: Payer: Self-pay | Admitting: Cardiology

## 2012-06-25 ENCOUNTER — Encounter (HOSPITAL_COMMUNITY): Payer: Self-pay | Admitting: *Deleted

## 2012-06-25 ENCOUNTER — Emergency Department (HOSPITAL_COMMUNITY)
Admission: EM | Admit: 2012-06-25 | Discharge: 2012-06-25 | Disposition: A | Discharge status: Home / Self Care | Payer: Self-pay | Attending: Emergency Medicine | Admitting: Emergency Medicine

## 2012-06-25 ENCOUNTER — Encounter (HOSPITAL_COMMUNITY): Admission: RE | Admit: 2012-06-25 | Payer: Self-pay | Source: Ambulatory Visit

## 2012-06-25 DIAGNOSIS — M79609 Pain in unspecified limb: Secondary | ICD-10-CM | POA: Insufficient documentation

## 2012-06-25 DIAGNOSIS — E119 Type 2 diabetes mellitus without complications: Secondary | ICD-10-CM | POA: Insufficient documentation

## 2012-06-25 DIAGNOSIS — Z7982 Long term (current) use of aspirin: Secondary | ICD-10-CM | POA: Insufficient documentation

## 2012-06-25 DIAGNOSIS — Z09 Encounter for follow-up examination after completed treatment for conditions other than malignant neoplasm: Secondary | ICD-10-CM | POA: Insufficient documentation

## 2012-06-25 DIAGNOSIS — G4733 Obstructive sleep apnea (adult) (pediatric): Secondary | ICD-10-CM | POA: Insufficient documentation

## 2012-06-25 DIAGNOSIS — M79673 Pain in unspecified foot: Secondary | ICD-10-CM

## 2012-06-25 DIAGNOSIS — E785 Hyperlipidemia, unspecified: Secondary | ICD-10-CM | POA: Insufficient documentation

## 2012-06-25 DIAGNOSIS — B86 Scabies: Secondary | ICD-10-CM | POA: Insufficient documentation

## 2012-06-25 DIAGNOSIS — I10 Essential (primary) hypertension: Secondary | ICD-10-CM | POA: Insufficient documentation

## 2012-06-25 DIAGNOSIS — I2510 Atherosclerotic heart disease of native coronary artery without angina pectoris: Secondary | ICD-10-CM | POA: Insufficient documentation

## 2012-06-25 DIAGNOSIS — F17200 Nicotine dependence, unspecified, uncomplicated: Secondary | ICD-10-CM | POA: Insufficient documentation

## 2012-06-25 DIAGNOSIS — Z5189 Encounter for other specified aftercare: Secondary | ICD-10-CM

## 2012-06-25 DIAGNOSIS — Z794 Long term (current) use of insulin: Secondary | ICD-10-CM | POA: Insufficient documentation

## 2012-06-25 MED ORDER — PERMETHRIN 5 % EX CREA: TOPICAL_CREAM | CUTANEOUS | Status: DC

## 2012-06-25 MED ORDER — TRAMADOL HCL 50 MG PO TABS
50.0000 mg | ORAL_TABLET | Freq: Once | ORAL | Status: AC
  Administered 2012-06-25: 50 mg via ORAL
  Filled 2012-06-25: qty 1

## 2012-06-25 MED ORDER — TRAMADOL HCL 50 MG PO TABS: 50.0000 mg | ORAL_TABLET | Freq: Four times a day (QID) | ORAL | Status: DC | PRN

## 2012-06-25 MED ORDER — TRAMADOL HCL 50 MG PO TABS: 50.0000 mg | ORAL_TABLET | Freq: Once | ORAL | Status: AC

## 2012-06-25 NOTE — Telephone Encounter (Signed)
Plz return call to patient at [phone] regarding disability ltr.

## 2012-06-25 NOTE — ED Provider Notes (Signed)
History     CSN: [id number]  Arrival date & time 06/25/12  1054   First MD Initiated Contact with Patient 06/25/12 1222      Chief Complaint  Patient presents with  . Wound Check    (Consider location/radiation/quality/duration/timing/severity/associated sxs/prior treatment) HPI Comments: Patient with history of diabetes, presents for wound check after being seen in the ED on 06/18/13. Patient states that the wound is much improved but they he occasionally has pain. Patient also complains of a rash that he notice last week on his legs and between his toes. Denies fever or chills. Denies changes in lotion or detergent.  The history is provided by the patient. No language interpreter was used.    Past Medical History  Diagnosis Date  . Hypertension   . Hyperlipidemia   . DM (diabetes mellitus)   . OSA on CPAP 01/19/2012  . CAD (coronary artery disease) 01/18/12    BMS to RCA    Past Surgical History  Procedure Date  . No past surgeries   . Coronary stent placement     Family History  Problem Relation Age of Onset  . Heart attack Father 79  . Heart attack Brother     History  Substance Use Topics  . Smoking status: Current Some Day Smoker -- 0.5 packs/day for 35 years    Types: Cigarettes  . Smokeless tobacco: Never Used  . Alcohol Use: Yes     12 beers twice a week.      Review of Systems  Constitutional: Negative for fever and chills.  Skin: Positive for rash and wound.    Allergies  Review of patient's allergies indicates no known allergies.  Home Medications   Current Outpatient Rx  Name Route Sig Dispense Refill  . ASPIRIN 81 MG PO CHEW Oral Chew 1 tablet (81 mg total) by mouth daily. 30 tablet 5  . ATENOLOL 25 MG PO TABS Oral Take 25 mg by mouth daily.    . ATORVASTATIN CALCIUM 40 MG PO TABS Oral Take 40 mg by mouth daily.    Marland Kitchen CIPROFLOXACIN HCL 500 MG PO TABS Oral Take 1 tablet (500 mg total) by mouth every 12 (twelve) hours. 20 tablet 0  . OMEGA-3  FATTY ACIDS 1000 MG PO CAPS Oral Take 1 g by mouth 2 (two) times daily.    Marland Kitchen GLIPIZIDE 10 MG PO TABS Oral Take 1 tablet (10 mg total) by mouth daily. 30 tablet 0  . HYDROCODONE-ACETAMINOPHEN 5-325 MG PO TABS Oral Take 1 tablet by mouth every 4 (four) hours as needed.    . INSULIN GLARGINE 100 UNIT/ML [place] SOLN Subcutaneous Inject 30 Units into the skin 2 (two) times daily.     Marland Kitchen LISINOPRIL 5 MG PO TABS Oral Take 2.5 mg by mouth 2 (two) times daily.     Marland Kitchen METFORMIN HCL 1000 MG PO TABS Oral Take 1 tablet (1,000 mg total) by mouth 2 (two) times daily with a meal. RESUME On MONDAY 60 tablet 0  . PRASUGREL HCL 10 MG PO TABS Oral Take 1 tablet (10 mg total) by mouth daily. 30 tablet 5  . PERMETHRIN 5 % EX CREA  Apply to entire body other than face - let sit for 14 hours then wash off, may repeat in 1 week if still having symptoms 60 g 1  . TRAMADOL HCL 50 MG PO TABS Oral Take 1 tablet (50 mg total) by mouth every 6 (six) hours as needed for pain. 15 tablet 0  BP 104/65  Pulse 73  Temp 98 F (36.7 C) (Oral)  Resp 18  SpO2 96%  Physical Exam  Nursing note and vitals reviewed. Constitutional: He appears well-developed and well-nourished. No distress.  HENT:  Head: Normocephalic and atraumatic.  Mouth/Throat: Oropharynx is clear and moist.  Eyes: Conjunctivae normal and EOM are normal.  Neck: Normal range of motion. Neck supple.  Cardiovascular: Normal rate, regular rhythm and normal heart sounds.   Pulmonary/Chest: Effort normal and breath sounds normal.  Abdominal: Soft. Bowel sounds are normal.  Neurological: He is alert.  Skin: Skin is warm and dry.       ED Course  Procedures (including critical care time)  Labs Reviewed - No data to display No results found.   1. Scabies   2. Foot pain   3. Visit for wound check       MDM  Patient seen for recheck of wound. Area appears to be healing well, no warmth or drainage, patient afebrile. Rash on legs concerning for scabies.  Patient discharged with pain medication for symptomatic foot pain and Permetherin with instructions on what to do with bedding and clothes. No red flags for cellulitis or morbilliform rash.       Pixie Casino, PA-C 06/25/12 1518

## 2012-06-25 NOTE — Discharge Instructions (Signed)
Use medication for scabies as directed Follow instructions in discharge on what to do in your home to prevent more scabies. You need to follow-up with your primary care about your foot. Take pain medication as written.  If the area begins to drain pus, painful, or you develop fever, return to the ER

## 2012-06-25 NOTE — Telephone Encounter (Signed)
Patient called stated he needed a supporting letter so he can file for disability.Patient was told Dr.Jordan out of office today,will check with him tomorrow 06/26/12 and call him back.

## 2012-06-25 NOTE — ED Notes (Signed)
Pt reports being seen here last week, given oral abx for R foot wound. Sts here for follow up for that. And that "red spots, maybe bites" on bil lower legs. Sts wound on foot is better but still having sharp pain "inside" foot. Sts it could be his neuropathy, but he wants to make sure.

## 2012-06-25 NOTE — ED Provider Notes (Signed)
Medical screening examination/treatment/procedure(s) were performed by non-physician practitioner and as supervising physician I was immediately available for consultation/collaboration.    Taima Rada R Celestine Bougie, MD 06/25/12 1611 

## 2012-06-26 ENCOUNTER — Telehealth (HOSPITAL_COMMUNITY): Payer: Self-pay | Admitting: *Deleted

## 2012-06-26 NOTE — Telephone Encounter (Signed)
Patient called was told from a cardiac standpoint not able to write letter for disability.

## 2012-06-27 ENCOUNTER — Encounter (HOSPITAL_COMMUNITY): Payer: Self-pay

## 2012-06-29 ENCOUNTER — Encounter (HOSPITAL_COMMUNITY): Payer: Self-pay

## 2012-07-02 ENCOUNTER — Encounter (HOSPITAL_COMMUNITY)
Admission: RE | Admit: 2012-07-02 | Discharge: 2012-07-02 | Disposition: A | Discharge status: Home / Self Care | Payer: Self-pay | Source: Ambulatory Visit | Attending: Cardiology | Admitting: Cardiology

## 2012-07-02 LAB — GLUCOSE, CAPILLARY: Glucose-Capillary: 142 mg/dL — ABNORMAL HIGH (ref 70–99)

## 2012-07-02 NOTE — Progress Notes (Signed)
[name] [name] returned to exercise today and completed exercise without difficulty.  [name] said he completed using the ointment prescribed by the ED.

## 2012-07-04 ENCOUNTER — Encounter (HOSPITAL_COMMUNITY)
Admission: RE | Admit: 2012-07-04 | Discharge: 2012-07-04 | Disposition: A | Discharge status: Home / Self Care | Payer: Self-pay | Source: Ambulatory Visit | Attending: Cardiology | Admitting: Cardiology

## 2012-07-04 LAB — GLUCOSE, CAPILLARY: Glucose-Capillary: 160 mg/dL — ABNORMAL HIGH (ref 70–99)

## 2012-07-06 ENCOUNTER — Encounter (HOSPITAL_COMMUNITY): Payer: Self-pay

## 2012-07-09 ENCOUNTER — Encounter (HOSPITAL_COMMUNITY)
Admission: RE | Admit: 2012-07-09 | Discharge: 2012-07-09 | Disposition: A | Discharge status: Home / Self Care | Payer: Self-pay | Source: Ambulatory Visit | Attending: Cardiology | Admitting: Cardiology

## 2012-07-11 ENCOUNTER — Encounter (HOSPITAL_COMMUNITY)
Admission: RE | Admit: 2012-07-11 | Discharge: 2012-07-11 | Disposition: A | Discharge status: Home / Self Care | Payer: Self-pay | Source: Ambulatory Visit | Attending: Cardiology | Admitting: Cardiology

## 2012-07-11 LAB — GLUCOSE, CAPILLARY: Glucose-Capillary: 144 mg/dL — ABNORMAL HIGH (ref 70–99)

## 2012-07-12 ENCOUNTER — Ambulatory Visit (INDEPENDENT_AMBULATORY_CARE_PROVIDER_SITE_OTHER): Payer: Self-pay | Admitting: Family Medicine

## 2012-07-12 ENCOUNTER — Encounter: Payer: Self-pay | Admitting: Family Medicine

## 2012-07-12 VITALS — BP 123/72 | HR 71 | Temp 98.4°F | Ht 66.5 in | Wt 267.0 lb

## 2012-07-12 DIAGNOSIS — I10 Essential (primary) hypertension: Secondary | ICD-10-CM

## 2012-07-12 DIAGNOSIS — E1165 Type 2 diabetes mellitus with hyperglycemia: Secondary | ICD-10-CM

## 2012-07-12 DIAGNOSIS — R0989 Other specified symptoms and signs involving the circulatory and respiratory systems: Secondary | ICD-10-CM

## 2012-07-12 DIAGNOSIS — E1100 Type 2 diabetes mellitus with hyperosmolarity without nonketotic hyperglycemic-hyperosmolar coma (NKHHC): Secondary | ICD-10-CM

## 2012-07-12 DIAGNOSIS — G4733 Obstructive sleep apnea (adult) (pediatric): Secondary | ICD-10-CM

## 2012-07-12 DIAGNOSIS — I2510 Atherosclerotic heart disease of native coronary artery without angina pectoris: Secondary | ICD-10-CM

## 2012-07-12 DIAGNOSIS — E785 Hyperlipidemia, unspecified: Secondary | ICD-10-CM

## 2012-07-12 DIAGNOSIS — R0600 Dyspnea, unspecified: Secondary | ICD-10-CM

## 2012-07-12 DIAGNOSIS — E119 Type 2 diabetes mellitus without complications: Secondary | ICD-10-CM

## 2012-07-12 MED ORDER — ATENOLOL 25 MG PO TABS: 25.0000 mg | ORAL_TABLET | Freq: Every day | ORAL | Status: DC

## 2012-07-12 MED ORDER — LISINOPRIL 5 MG PO TABS: 2.5000 mg | ORAL_TABLET | Freq: Two times a day (BID) | ORAL | Status: DC

## 2012-07-12 MED ORDER — INSULIN GLARGINE 100 UNIT/ML ~~LOC~~ SOLN: 30.0000 [IU] | Freq: Two times a day (BID) | SUBCUTANEOUS | Status: DC

## 2012-07-12 MED ORDER — ATORVASTATIN CALCIUM 40 MG PO TABS: 40.0000 mg | ORAL_TABLET | Freq: Every day | ORAL | Status: DC

## 2012-07-12 MED ORDER — METFORMIN HCL 1000 MG PO TABS: 1000.0000 mg | ORAL_TABLET | Freq: Two times a day (BID) | ORAL | Status: DC

## 2012-07-12 MED ORDER — "INSULIN SYRINGE-NEEDLE U-100 31G X 5/16"" 1 ML MISC": 1.0000 [IU] | Status: DC

## 2012-07-12 MED ORDER — GLIPIZIDE 10 MG PO TABS: 10.0000 mg | ORAL_TABLET | Freq: Every day | ORAL | Status: DC

## 2012-07-12 NOTE — Assessment & Plan Note (Signed)
Per cards--continue cardiac rehab and anti-plt medication.  Encouraged to quit smoking.

## 2012-07-12 NOTE — Progress Notes (Signed)
Subjective:    Patient ID: [name] [name], male    DOB: [birth year], 51 y.o.   MRN: [id number]  HPI  New pt. Today.  Previously of [facility].  Needs new PCP.  Complicated PMH including DM, HTN, CAD, S/p RCA stent, obesity and tobacco use. Reports taking meds he has but needing refills.  He is in cardiac rehab.  He has been laid off and is presently in school for computer technology.  He is having difficulty finding a job and is finding it increasingly difficult to function.  He has significant dyspnea on exertion.  He also reports arthritis in most joints, chronic back pain, trouble seeing, trouble hearing, ringing in his ears, abdominal, weakness, stress, depression, anxiety.  To apply for [facility] card next week.  Needs flu and TDaP at next visit.  Past Medical History  Diagnosis Date  . Hypertension   . Hyperlipidemia   . DM (diabetes mellitus)   . OSA on CPAP 01/19/2012  . CAD (coronary artery disease) 01/18/12    BMS to RCA   Past Surgical History  Procedure Date  . No past surgeries   . Coronary stent placement    Family History  Problem Relation Age of Onset  . Heart attack Father 35  . Heart attack Brother   . Stroke Brother   . Cancer Mother     breast  . Hypertension Sister   . Hyperlipidemia Sister   . Hyperlipidemia Sister   . Hypertension Sister    History   Social History  . Marital Status: Single    Spouse Name: N/A    Number of Children: 0  . Years of Education: 15   Occupational History  . Unemployed     Used to work for [employer]  . Student, computer tech [employer]    Social History Main Topics  . Smoking status: Current Some Day Smoker -- 0.5 packs/day for 35 years    Types: Cigarettes  . Smokeless tobacco: Never Used  . Alcohol Use: Yes     12 beers twice a week.  . Drug Use: No  . Sexually Active: Not on file   Other Topics Concern  . Not on file   Social History Narrative   Single.  Lives with a roommate.  Ambulates independently.    Current Outpatient Prescriptions on File Prior to Visit  Medication Sig Dispense Refill  . aspirin 81 MG chewable tablet Chew 1 tablet (81 mg total) by mouth daily.  30 tablet  5  . fish oil-omega-3 fatty acids 1000 MG capsule Take 1 g by mouth 2 (two) times daily.      Marland Kitchen HYDROcodone-acetaminophen (NORCO/VICODIN) 5-325 MG per tablet Take 1 tablet by mouth every 4 (four) hours as needed.      . prasugrel (EFFIENT) 10 MG TABS Take 1 tablet (10 mg total) by mouth daily.  30 tablet  5  . traMADol (ULTRAM) 50 MG tablet Take 1 tablet (50 mg total) by mouth every 6 (six) hours as needed for pain.  15 tablet  0  . DISCONTD: atenolol (TENORMIN) 25 MG tablet Take 25 mg by mouth daily.      Marland Kitchen DISCONTD: glipiZIDE (GLUCOTROL) 10 MG tablet Take 1 tablet (10 mg total) by mouth daily.  30 tablet  0  . DISCONTD: lisinopril (PRINIVIL,ZESTRIL) 5 MG tablet Take 2.5 mg by mouth 2 (two) times daily.       Marland Kitchen DISCONTD: metFORMIN (GLUCOPHAGE) 1000 MG tablet Take 1 tablet (1,000  mg total) by mouth 2 (two) times daily with a meal. RESUME On MONDAY  60 tablet  0   No Known Allergies    Review of Systems  Constitutional: Positive for fever.  HENT: Negative for hearing loss and tinnitus.   Respiratory: Positive for chest tightness and shortness of breath.   Cardiovascular: Positive for leg swelling.  Gastrointestinal: Positive for abdominal pain.  Musculoskeletal: Positive for back pain and joint swelling.  Skin: Positive for wound.  Psychiatric/Behavioral: Positive for dysphoric mood.       Objective:   Physical Exam  Vitals reviewed. Constitutional: He is oriented to person, place, and time. He appears well-developed.  HENT:  Head: Normocephalic and atraumatic.  Eyes: No scleral icterus.  Neck: Neck supple.  Cardiovascular: Normal rate, regular rhythm and intact distal pulses.   No murmur heard. Pulmonary/Chest: Effort normal. No respiratory distress. He has no wheezes.  Abdominal: Soft. There is no  tenderness.  Musculoskeletal: Normal range of motion. He exhibits no tenderness.       Multiple calluses noted on sides of feet.  Area of erythema noted on side of right foot without warmth.  Neurological: He is alert and oriented to person, place, and time.  Skin: Skin is warm and dry.          Assessment & Plan:

## 2012-07-12 NOTE — Assessment & Plan Note (Signed)
Continue CPAP.  

## 2012-07-12 NOTE — Assessment & Plan Note (Signed)
BP has looked good and is well controlled--continue current regimen

## 2012-07-12 NOTE — Assessment & Plan Note (Signed)
Will check hgbA1C when receives orange card and adjust medication as needed.  Last A1C was 13, prior to hospitalization.  BS seem to be improving based on BS from cardiac rehab.  Continue diet and exercise and medication regimen.

## 2012-07-12 NOTE — Patient Instructions (Signed)
Smoking Cessation Quitting smoking is important to your health and has many advantages. However, it is not always easy to quit since nicotine is a very addictive drug. Often times, people try 3 times or more before being able to quit. This document explains the best ways for you to prepare to quit smoking. Quitting takes hard work and a lot of effort, but you can do it. ADVANTAGES OF QUITTING SMOKING  You will live longer, feel better, and live better.  Your body will feel the impact of quitting smoking almost immediately.  Within 20 minutes, blood pressure decreases. Your pulse returns to its normal level.  After 8 hours, carbon monoxide levels in the blood return to normal. Your oxygen level increases.  After 24 hours, the chance of having a heart attack starts to decrease. Your breath, hair, and body stop smelling like smoke.  After 48 hours, damaged nerve endings begin to recover. Your sense of taste and smell improve.  After 72 hours, the body is virtually free of nicotine. Your bronchial tubes relax and breathing becomes easier.  After 2 to 12 weeks, lungs can hold more air. Exercise becomes easier and circulation improves.  The risk of having a heart attack, stroke, cancer, or lung disease is greatly reduced.  After 1 year, the risk of coronary heart disease is cut in half.  After 5 years, the risk of stroke falls to the same as a nonsmoker.  After 10 years, the risk of lung cancer is cut in half and the risk of other cancers decreases significantly.  After 15 years, the risk of coronary heart disease drops, usually to the level of a nonsmoker.  If you are pregnant, quitting smoking will improve your chances of having a healthy baby.  The people you live with, especially any children, will be healthier.  You will have extra money to spend on things other than cigarettes. QUESTIONS TO THINK ABOUT BEFORE ATTEMPTING TO QUIT You may want to talk about your answers with your  caregiver.  Why do you want to quit?  If you tried to quit in the past, what helped and what did not?  What will be the most difficult situations for you after you quit? How will you plan to handle them?  Who can help you through the tough times? Your family? Friends? A caregiver?  What pleasures do you get from smoking? What ways can you still get pleasure if you quit? Here are some questions to ask your caregiver:  How can you help me to be successful at quitting?  What medicine do you think would be best for me and how should I take it?  What should I do if I need more help?  What is smoking withdrawal like? How can I get information on withdrawal? GET READY  Set a quit date.  Change your environment by getting rid of all cigarettes, ashtrays, matches, and lighters in your home, car, or work. Do not let people smoke in your home.  Review your past attempts to quit. Think about what worked and what did not. GET SUPPORT AND ENCOURAGEMENT You have a better chance of being successful if you have help. You can get support in many ways.  Tell your family, friends, and co-workers that you are going to quit and need their support. Ask them not to smoke around you.  Get individual, group, or telephone counseling and support. Programs are available at local hospitals and health centers. Call your local health department for   information about programs in your area.  Spiritual beliefs and practices may help some smokers quit.  Download a "quit meter" on your computer to keep track of quit statistics, such as how long you have gone without smoking, cigarettes not smoked, and money saved.  Get a self-help book about quitting smoking and staying off of tobacco. LEARN NEW SKILLS AND BEHAVIORS  Distract yourself from urges to smoke. Talk to someone, go for a walk, or occupy your time with a task.  Change your normal routine. Take a different route to work. Drink tea instead of coffee.  Eat breakfast in a different place.  Reduce your stress. Take a hot bath, exercise, or read a book.  Plan something enjoyable to do every day. Reward yourself for not smoking.  Explore interactive web-based programs that specialize in helping you quit. GET MEDICINE AND USE IT CORRECTLY Medicines can help you stop smoking and decrease the urge to smoke. Combining medicine with the above behavioral methods and support can greatly increase your chances of successfully quitting smoking.  Nicotine replacement therapy helps deliver nicotine to your body without the negative effects and risks of smoking. Nicotine replacement therapy includes nicotine gum, lozenges, inhalers, nasal sprays, and skin patches. Some may be available over-the-counter and others require a prescription.  Antidepressant medicine helps people abstain from smoking, but how this works is unknown. This medicine is available by prescription.  Nicotinic receptor partial agonist medicine simulates the effect of nicotine in your brain. This medicine is available by prescription. Ask your caregiver for advice about which medicines to use and how to use them based on your health history. Your caregiver will tell you what side effects to look out for if you choose to be on a medicine or therapy. Carefully read the information on the package. Do not use any other product containing nicotine while using a nicotine replacement product.  RELAPSE OR DIFFICULT SITUATIONS Most relapses occur within the first 3 months after quitting. Do not be discouraged if you start smoking again. Remember, most people try several times before finally quitting. You may have symptoms of withdrawal because your body is used to nicotine. You may crave cigarettes, be irritable, feel very hungry, cough often, get headaches, or have difficulty concentrating. The withdrawal symptoms are only temporary. They are strongest when you first quit, but they will go away within  10 14 days. To reduce the chances of relapse, try to:  Avoid drinking alcohol. Drinking lowers your chances of successfully quitting.  Reduce the amount of caffeine you consume. Once you quit smoking, the amount of caffeine in your body increases and can give you symptoms, such as a rapid heartbeat, sweating, and anxiety.  Avoid smokers because they can make you want to smoke.  Do not let weight gain distract you. Many smokers will gain weight when they quit, usually less than 10 pounds. Eat a healthy diet and stay active. You can always lose the weight gained after you quit.  Find ways to improve your mood other than smoking. FOR MORE INFORMATION  www.smokefree.gov  Document Released: 08/23/2001 Document Revised: 02/28/2012 Document Reviewed: 12/08/2011 ExitCare Patient Information 2013 [employer]. Diabetes and Foot Care Diabetes may cause you to have a poor blood supply (circulation) to your legs and feet. Because of this, the skin may be thinner, break easier, and heal more slowly. You also may have nerve damage in your legs and feet causing decreased feeling. You may not notice minor injuries to your feet that   could lead to serious problems or infections. Taking care of your feet is one of the most important things you can do for yourself.  HOME CARE INSTRUCTIONS  Do not go barefoot. Bare feet are easily injured.  Check your feet daily for blisters, cuts, and redness.  Wash your feet with warm water (not hot) and mild soap. Pat your feet and between your toes until completely dry.  Apply a moisturizing lotion that does not contain alcohol or petroleum jelly to the dry skin on your feet and to dry brittle toenails. Do not put it between your toes.  Trim your toenails straight across. Do not dig under them or around the cuticle.  Do not cut corns or calluses, or try to remove them with medicine.  Wear clean cotton socks or stockings every day. Make sure they are not too tight.  Do not wear knee high stockings since they may decrease blood flow to your legs.  Wear leather shoes that fit properly and have enough cushioning. To break in new shoes, wear them just a few hours a day to avoid injuring your feet.  Wear shoes at all times, even in the house.  Do not cross your legs. This may decrease the blood flow to your feet.  If you find a minor scrape, cut, or break in the skin on your feet, keep it and the skin around it clean and dry. These areas may be cleansed with mild soap and water. Do not use peroxide, alcohol, iodine or Merthiolate.  When you remove an adhesive bandage, be sure not to harm the skin around it.  If you have a wound, look at it several times a day to make sure it is healing.  Do not use heating pads or hot water bottles. Burns can occur. If you have lost feeling in your feet or legs, you may not know it is happening until it is too late.  Report any cuts, sores or bruises to your caregiver. Do not wait! SEEK MEDICAL CARE IF:   You have an injury that is not healing or you notice redness, numbness, burning, or tingling.  Your feet always feel cold.  You have pain or cramps in your legs and feet. SEEK IMMEDIATE MEDICAL CARE IF:   There is increasing redness, swelling, or increasing pain in the wound.  There is a red line that goes up your leg.  Pus is coming from a wound.  You develop an unexplained oral temperature above 102 F (38.9 C), or as your caregiver suggests.  You notice a bad smell coming from an ulcer or wound. MAKE SURE YOU:   Understand these instructions.  Will watch your condition.  Will get help right away if you are not doing well or get worse. Document Released: 08/26/2000 Document Revised: 11/21/2011 Document Reviewed: 03/04/2009 ExitCare Patient Information 2013 [employer].  

## 2012-07-12 NOTE — Assessment & Plan Note (Signed)
Needs repeat lipids in 3-6 months.  Continue Lipitor.

## 2012-07-12 NOTE — Assessment & Plan Note (Signed)
Unclear etiology, likely deconditioning, obesity, OSA and smoking---urged cessation

## 2012-07-13 ENCOUNTER — Encounter (HOSPITAL_COMMUNITY): Payer: Self-pay

## 2012-07-16 ENCOUNTER — Encounter (HOSPITAL_COMMUNITY)
Admission: RE | Admit: 2012-07-16 | Discharge: 2012-07-16 | Disposition: A | Discharge status: Home / Self Care | Payer: Self-pay | Source: Ambulatory Visit | Attending: Cardiology | Admitting: Cardiology

## 2012-07-16 DIAGNOSIS — E781 Pure hyperglyceridemia: Secondary | ICD-10-CM | POA: Insufficient documentation

## 2012-07-16 DIAGNOSIS — R079 Chest pain, unspecified: Secondary | ICD-10-CM | POA: Insufficient documentation

## 2012-07-16 DIAGNOSIS — Z5189 Encounter for other specified aftercare: Secondary | ICD-10-CM | POA: Insufficient documentation

## 2012-07-16 DIAGNOSIS — I059 Rheumatic mitral valve disease, unspecified: Secondary | ICD-10-CM | POA: Insufficient documentation

## 2012-07-16 DIAGNOSIS — I10 Essential (primary) hypertension: Secondary | ICD-10-CM | POA: Insufficient documentation

## 2012-07-16 LAB — GLUCOSE, CAPILLARY: Glucose-Capillary: 191 mg/dL — ABNORMAL HIGH (ref 70–99)

## 2012-07-18 ENCOUNTER — Encounter (HOSPITAL_COMMUNITY)
Admission: RE | Admit: 2012-07-18 | Discharge: 2012-07-18 | Disposition: A | Discharge status: Home / Self Care | Payer: Self-pay | Source: Ambulatory Visit | Attending: Cardiology | Admitting: Cardiology

## 2012-07-20 ENCOUNTER — Encounter (HOSPITAL_COMMUNITY): Payer: Self-pay

## 2012-07-23 ENCOUNTER — Encounter (HOSPITAL_COMMUNITY)
Admission: RE | Admit: 2012-07-23 | Discharge: 2012-07-23 | Disposition: A | Discharge status: Home / Self Care | Payer: Self-pay | Source: Ambulatory Visit | Attending: Cardiology | Admitting: Cardiology

## 2012-07-25 ENCOUNTER — Encounter (HOSPITAL_COMMUNITY)
Admission: RE | Admit: 2012-07-25 | Discharge: 2012-07-25 | Disposition: A | Discharge status: Home / Self Care | Payer: Self-pay | Source: Ambulatory Visit | Attending: Cardiology | Admitting: Cardiology

## 2012-07-27 ENCOUNTER — Encounter (HOSPITAL_COMMUNITY): Payer: Self-pay

## 2012-07-30 ENCOUNTER — Encounter (HOSPITAL_COMMUNITY): Payer: Self-pay

## 2012-07-31 ENCOUNTER — Ambulatory Visit (INDEPENDENT_AMBULATORY_CARE_PROVIDER_SITE_OTHER): Payer: Self-pay | Admitting: Family Medicine

## 2012-07-31 ENCOUNTER — Encounter: Payer: Self-pay | Admitting: Family Medicine

## 2012-07-31 VITALS — BP 105/70 | HR 63 | Temp 97.8°F | Ht 66.5 in | Wt 270.0 lb

## 2012-07-31 DIAGNOSIS — E669 Obesity, unspecified: Secondary | ICD-10-CM | POA: Insufficient documentation

## 2012-07-31 DIAGNOSIS — I10 Essential (primary) hypertension: Secondary | ICD-10-CM

## 2012-07-31 DIAGNOSIS — E1100 Type 2 diabetes mellitus with hyperosmolarity without nonketotic hyperglycemic-hyperosmolar coma (NKHHC): Secondary | ICD-10-CM

## 2012-07-31 DIAGNOSIS — E119 Type 2 diabetes mellitus without complications: Secondary | ICD-10-CM

## 2012-07-31 LAB — POCT GLYCOSYLATED HEMOGLOBIN (HGB A1C): Hemoglobin A1C: 8.5

## 2012-07-31 NOTE — Progress Notes (Signed)
  Subjective:    Patient ID: [name] [name], male    DOB: 12-20-1960, 51 y.o.   MRN: [id number]  HPI  Here today for f/u.  Brings meter and BS are 100-330.  Most are in the 100 range.  Roommates are using his meter.  He report BS are in the 150-230 range.  Reports good medication compliance.  Weight is going up.  BP looks good.  Awaiting disability.  Review of Systems  Constitutional: Negative for fever and chills.  HENT: Negative for rhinorrhea.   Respiratory: Positive for shortness of breath.   Gastrointestinal: Negative for nausea, vomiting, abdominal pain, diarrhea and constipation.  Musculoskeletal: Positive for back pain, joint swelling and arthralgias.  Skin: Negative for rash.       Objective:   Physical Exam  Vitals reviewed. Constitutional: He is oriented to person, place, and time. He appears well-developed and well-nourished.  HENT:  Head: Normocephalic and atraumatic.  Eyes: No scleral icterus.  Neck: Neck supple.  Cardiovascular: Normal rate and regular rhythm.   Pulmonary/Chest: Effort normal.  Abdominal: Soft. There is no tenderness.  Neurological: He is alert and oriented to person, place, and time.          Assessment & Plan:  Awaiting orange card and given support letter for disability.

## 2012-07-31 NOTE — Assessment & Plan Note (Addendum)
Work on [employer] loss--advised switch to 1% milk, decrease white bread, substitute whole wheat, whole grains.  Cut out diet soda.

## 2012-07-31 NOTE — Patient Instructions (Signed)
Smoking Cessation Quitting smoking is important to your health and has many advantages. However, it is not always easy to quit since nicotine is a very addictive drug. Often times, people try 3 times or more before being able to quit. This document explains the best ways for you to prepare to quit smoking. Quitting takes hard work and a lot of effort, but you can do it. ADVANTAGES OF QUITTING SMOKING  You will live longer, feel better, and live better.  Your body will feel the impact of quitting smoking almost immediately.  Within 20 minutes, blood pressure decreases. Your pulse returns to its normal level.  After 8 hours, carbon monoxide levels in the blood return to normal. Your oxygen level increases.  After 24 hours, the chance of having a heart attack starts to decrease. Your breath, hair, and body stop smelling like smoke.  After 48 hours, damaged nerve endings begin to recover. Your sense of taste and smell improve.  After 72 hours, the body is virtually free of nicotine. Your bronchial tubes relax and breathing becomes easier.  After 2 to 12 weeks, lungs can hold more air. Exercise becomes easier and circulation improves.  The risk of having a heart attack, stroke, cancer, or lung disease is greatly reduced.  After 1 year, the risk of coronary heart disease is cut in half.  After 5 years, the risk of stroke falls to the same as a nonsmoker.  After 10 years, the risk of lung cancer is cut in half and the risk of other cancers decreases significantly.  After 15 years, the risk of coronary heart disease drops, usually to the level of a nonsmoker.  If you are pregnant, quitting smoking will improve your chances of having a healthy baby.  The people you live with, especially any children, will be healthier.  You will have extra money to spend on things other than cigarettes. QUESTIONS TO THINK ABOUT BEFORE ATTEMPTING TO QUIT You may want to talk about your answers with your  caregiver.  Why do you want to quit?  If you tried to quit in the past, what helped and what did not?  What will be the most difficult situations for you after you quit? How will you plan to handle them?  Who can help you through the tough times? Your family? Friends? A caregiver?  What pleasures do you get from smoking? What ways can you still get pleasure if you quit? Here are some questions to ask your caregiver:  How can you help me to be successful at quitting?  What medicine do you think would be best for me and how should I take it?  What should I do if I need more help?  What is smoking withdrawal like? How can I get information on withdrawal? GET READY  Set a quit date.  Change your environment by getting rid of all cigarettes, ashtrays, matches, and lighters in your home, car, or work. Do not let people smoke in your home.  Review your past attempts to quit. Think about what worked and what did not. GET SUPPORT AND ENCOURAGEMENT You have a better chance of being successful if you have help. You can get support in many ways.  Tell your family, friends, and co-workers that you are going to quit and need their support. Ask them not to smoke around you.  Get individual, group, or telephone counseling and support. Programs are available at local hospitals and health centers. Call your local health department for   information about programs in your area.  Spiritual beliefs and practices may help some smokers quit.  Download a "quit meter" on your computer to keep track of quit statistics, such as how long you have gone without smoking, cigarettes not smoked, and money saved.  Get a self-help book about quitting smoking and staying off of tobacco. LEARN NEW SKILLS AND BEHAVIORS  Distract yourself from urges to smoke. Talk to someone, go for a walk, or occupy your time with a task.  Change your normal routine. Take a different route to work. Drink tea instead of coffee.  Eat breakfast in a different place.  Reduce your stress. Take a hot bath, exercise, or read a book.  Plan something enjoyable to do every day. Reward yourself for not smoking.  Explore interactive web-based programs that specialize in helping you quit. GET MEDICINE AND USE IT CORRECTLY Medicines can help you stop smoking and decrease the urge to smoke. Combining medicine with the above behavioral methods and support can greatly increase your chances of successfully quitting smoking.  Nicotine replacement therapy helps deliver nicotine to your body without the negative effects and risks of smoking. Nicotine replacement therapy includes nicotine gum, lozenges, inhalers, nasal sprays, and skin patches. Some may be available over-the-counter and others require a prescription.  Antidepressant medicine helps people abstain from smoking, but how this works is unknown. This medicine is available by prescription.  Nicotinic receptor partial agonist medicine simulates the effect of nicotine in your brain. This medicine is available by prescription. Ask your caregiver for advice about which medicines to use and how to use them based on your health history. Your caregiver will tell you what side effects to look out for if you choose to be on a medicine or therapy. Carefully read the information on the package. Do not use any other product containing nicotine while using a nicotine replacement product.  RELAPSE OR DIFFICULT SITUATIONS Most relapses occur within the first 3 months after quitting. Do not be discouraged if you start smoking again. Remember, most people try several times before finally quitting. You may have symptoms of withdrawal because your body is used to nicotine. You may crave cigarettes, be irritable, feel very hungry, cough often, get headaches, or have difficulty concentrating. The withdrawal symptoms are only temporary. They are strongest when you first quit, but they will go away within  10 14 days. To reduce the chances of relapse, try to:  Avoid drinking alcohol. Drinking lowers your chances of successfully quitting.  Reduce the amount of caffeine you consume. Once you quit smoking, the amount of caffeine in your body increases and can give you symptoms, such as a rapid heartbeat, sweating, and anxiety.  Avoid smokers because they can make you want to smoke.  Do not let weight gain distract you. Many smokers will gain weight when they quit, usually less than 10 pounds. Eat a healthy diet and stay active. You can always lose the weight gained after you quit.  Find ways to improve your mood other than smoking. FOR MORE INFORMATION  www.smokefree.gov  Document Released: 08/23/2001 Document Revised: 02/28/2012 Document Reviewed: 12/08/2011 [facility] Patient Information 2013 [place], [place]. Calorie Counting Diet A calorie counting diet requires you to eat the number of calories that are right for you in a day. Calories are the measurement of how much energy you get from the food you eat. Eating the right amount of calories is important for staying at a healthy weight. If you eat too many calories,  your body will store them as fat and you may gain weight. If you eat too few calories, you may lose weight. Counting the number of calories you eat during a day will help you know if you are eating the right amount. A Registered Dietitian can determine how many calories you need in a day. The amount of calories needed varies from person to person. If your goal is to lose weight, you will need to eat fewer calories. Losing weight can benefit you if you are overweight or have health problems such as heart disease, high blood pressure, or diabetes. If your goal is to gain weight, you will need to eat more calories. Gaining weight may be necessary if you have a certain health problem that causes your body to need more energy. TIPS Whether you are increasing or decreasing the number of calories  you eat during a day, it may be hard to get used to changes in what you eat and drink. The following are tips to help you keep track of the number of calories you eat.  Measure foods at home with measuring cups. This helps you know the amount of food and number of calories you are eating.  Restaurants often serve food in amounts that are larger than 1 serving. While eating out, estimate how many servings of a food you are given. For example, a serving of cooked rice is  cup or about the size of half of a fist. Knowing serving sizes will help you be aware of how much food you are eating at restaurants.  Ask for smaller portion sizes or child-size portions at restaurants.  Plan to eat half of a meal at a restaurant. Take the rest home or share the other half with a friend.  Read the Nutrition Facts panel on food labels for calorie content and serving size. You can find out how many servings are in a package, the size of a serving, and the number of calories each serving has.  For example, a package might contain 3 cookies. The Nutrition Facts panel on that package says that 1 serving is 1 cookie. Below that, it will say there are 3 servings in the container. The calories section of the Nutrition Facts label says there are 90 calories. This means there are 90 calories in 1 cookie (1 serving). If you eat 1 cookie you have eaten 90 calories. If you eat all 3 cookies, you have eaten 270 calories (3 servings x 90 calories = 270 calories). The list below tells you how big or small some common portion sizes are.  1 oz.........4 stacked dice.  3 oz........Marland KitchenDeck of cards.  1 tsp.......Marland KitchenTip of little finger.  1 tbs......Marland KitchenMarland KitchenThumb.  2 tbs.......Marland KitchenGolf ball.   cup......Marland KitchenHalf of a fist.  1 cup.......Marland KitchenA fist. KEEP A FOOD LOG Write down every food item you eat, the amount you eat, and the number of calories in each food you eat during the day. At the end of the day, you can add up the total number of  calories you have eaten. It may help to keep a list like the one below. Find out the calorie information by reading the Nutrition Facts panel on food labels. Breakfast  Bran cereal (1 cup, 110 calories).  Fat-free milk ( cup, 45 calories). Snack  Apple (1 medium, 80 calories). Lunch  Spinach (1 cup, 20 calories).  Tomato ( medium, 20 calories).  Chicken breast strips (3 oz, 165 calories).  Shredded cheddar cheese ( cup, 110 calories).  Light [place] dressing (2 tbs, 60 calories).  Whole-wheat bread (1 slice, 80 calories).  Tub margarine (1 tsp, 35 calories).  Vegetable soup (1 cup, 160 calories). Dinner  Pork chop (3 oz, 190 calories).  Brown rice (1 cup, 215 calories).  Steamed broccoli ( cup, 20 calories).  Strawberries (1  cup, 65 calories).  Whipped cream (1 tbs, 50 calories). Daily Calorie Total: 1425 Document Released: 08/29/2005 Document Revised: 11/21/2011 Document Reviewed: 02/23/2007 [facility] Patient Information 2013 [place], [place].

## 2012-07-31 NOTE — Assessment & Plan Note (Signed)
BP looks good  

## 2012-07-31 NOTE — Assessment & Plan Note (Addendum)
Doing better. A1C has decreased from 13.6 to 8.5.  Congratulated pt. And re-enforced good behavior.

## 2012-08-01 ENCOUNTER — Encounter (HOSPITAL_COMMUNITY)
Admission: RE | Admit: 2012-08-01 | Discharge: 2012-08-01 | Disposition: A | Discharge status: Home / Self Care | Payer: Self-pay | Source: Ambulatory Visit | Attending: Cardiology | Admitting: Cardiology

## 2012-08-06 ENCOUNTER — Encounter (HOSPITAL_COMMUNITY)
Admission: RE | Admit: 2012-08-06 | Discharge: 2012-08-06 | Disposition: A | Discharge status: Home / Self Care | Payer: Self-pay | Source: Ambulatory Visit | Attending: Cardiology | Admitting: Cardiology

## 2012-08-08 ENCOUNTER — Encounter (HOSPITAL_COMMUNITY): Payer: Self-pay

## 2012-08-10 ENCOUNTER — Encounter (HOSPITAL_COMMUNITY): Payer: Self-pay

## 2012-08-13 ENCOUNTER — Encounter (HOSPITAL_COMMUNITY): Payer: Self-pay

## 2012-08-15 ENCOUNTER — Encounter (HOSPITAL_COMMUNITY)
Admission: RE | Admit: 2012-08-15 | Discharge: 2012-08-15 | Disposition: A | Discharge status: Home / Self Care | Payer: Self-pay | Source: Ambulatory Visit | Attending: Cardiology | Admitting: Cardiology

## 2012-08-15 DIAGNOSIS — I10 Essential (primary) hypertension: Secondary | ICD-10-CM | POA: Insufficient documentation

## 2012-08-15 DIAGNOSIS — R079 Chest pain, unspecified: Secondary | ICD-10-CM | POA: Insufficient documentation

## 2012-08-15 DIAGNOSIS — Z5189 Encounter for other specified aftercare: Secondary | ICD-10-CM | POA: Insufficient documentation

## 2012-08-15 DIAGNOSIS — E781 Pure hyperglyceridemia: Secondary | ICD-10-CM | POA: Insufficient documentation

## 2012-08-15 DIAGNOSIS — I059 Rheumatic mitral valve disease, unspecified: Secondary | ICD-10-CM | POA: Insufficient documentation

## 2012-08-15 LAB — GLUCOSE, CAPILLARY: Glucose-Capillary: 269 mg/dL — ABNORMAL HIGH (ref 70–99)

## 2012-08-17 ENCOUNTER — Encounter (HOSPITAL_COMMUNITY): Payer: Self-pay

## 2012-08-20 ENCOUNTER — Encounter (HOSPITAL_COMMUNITY)
Admission: RE | Admit: 2012-08-20 | Discharge: 2012-08-20 | Disposition: A | Discharge status: Home / Self Care | Payer: Self-pay | Source: Ambulatory Visit | Attending: Cardiology | Admitting: Cardiology

## 2012-08-20 LAB — GLUCOSE, CAPILLARY: Glucose-Capillary: 247 mg/dL — ABNORMAL HIGH (ref 70–99)

## 2012-08-22 ENCOUNTER — Encounter (HOSPITAL_COMMUNITY): Payer: Self-pay

## 2012-08-24 ENCOUNTER — Encounter: Payer: Self-pay | Admitting: Family Medicine

## 2012-08-24 ENCOUNTER — Ambulatory Visit (INDEPENDENT_AMBULATORY_CARE_PROVIDER_SITE_OTHER): Payer: No Typology Code available for payment source | Admitting: Family Medicine

## 2012-08-24 ENCOUNTER — Ambulatory Visit: Payer: Self-pay | Admitting: Family Medicine

## 2012-08-24 ENCOUNTER — Encounter (HOSPITAL_COMMUNITY): Payer: Self-pay

## 2012-08-24 VITALS — BP 118/72 | HR 73 | Temp 99.0°F | Ht 66.5 in | Wt 268.9 lb

## 2012-08-24 DIAGNOSIS — E119 Type 2 diabetes mellitus without complications: Secondary | ICD-10-CM

## 2012-08-24 DIAGNOSIS — I10 Essential (primary) hypertension: Secondary | ICD-10-CM

## 2012-08-24 DIAGNOSIS — E1100 Type 2 diabetes mellitus with hyperosmolarity without nonketotic hyperglycemic-hyperosmolar coma (NKHHC): Secondary | ICD-10-CM

## 2012-08-24 DIAGNOSIS — M549 Dorsalgia, unspecified: Secondary | ICD-10-CM | POA: Insufficient documentation

## 2012-08-24 DIAGNOSIS — E785 Hyperlipidemia, unspecified: Secondary | ICD-10-CM

## 2012-08-24 DIAGNOSIS — K029 Dental caries, unspecified: Secondary | ICD-10-CM

## 2012-08-24 DIAGNOSIS — Z87891 Personal history of nicotine dependence: Secondary | ICD-10-CM | POA: Insufficient documentation

## 2012-08-24 DIAGNOSIS — Z23 Encounter for immunization: Secondary | ICD-10-CM

## 2012-08-24 DIAGNOSIS — Z720 Tobacco use: Secondary | ICD-10-CM

## 2012-08-24 DIAGNOSIS — F17200 Nicotine dependence, unspecified, uncomplicated: Secondary | ICD-10-CM | POA: Insufficient documentation

## 2012-08-24 LAB — LIPID PANEL
LDL Cholesterol: 59 mg/dL (ref 0–99)
Total CHOL/HDL Ratio: 4.1 Ratio
VLDL: 39 mg/dL (ref 0–40)

## 2012-08-24 LAB — POCT GLYCOSYLATED HEMOGLOBIN (HGB A1C): Hemoglobin A1C: 9.8

## 2012-08-24 LAB — COMPREHENSIVE METABOLIC PANEL
ALT: 22 U/L (ref 0–53)
AST: 16 U/L (ref 0–37)
Alkaline Phosphatase: 125 U/L — ABNORMAL HIGH (ref 39–117)
Calcium: 9.6 mg/dL (ref 8.4–10.5)
Chloride: 102 mEq/L (ref 96–112)
Creat: 0.67 mg/dL (ref 0.50–1.35)

## 2012-08-24 MED ORDER — TRAMADOL HCL 50 MG PO TABS: 50.0000 mg | ORAL_TABLET | Freq: Four times a day (QID) | ORAL | Status: DC | PRN

## 2012-08-24 MED ORDER — SIMVASTATIN 20 MG PO TABS: 20.0000 mg | ORAL_TABLET | Freq: Every day | ORAL | Status: DC

## 2012-08-24 MED ORDER — LISINOPRIL 5 MG PO TABS: 2.5000 mg | ORAL_TABLET | Freq: Two times a day (BID) | ORAL | Status: DC

## 2012-08-24 MED ORDER — METFORMIN HCL 1000 MG PO TABS: 1000.0000 mg | ORAL_TABLET | Freq: Two times a day (BID) | ORAL | Status: DC

## 2012-08-24 MED ORDER — GLIPIZIDE 10 MG PO TABS: 10.0000 mg | ORAL_TABLET | Freq: Every day | ORAL | Status: DC

## 2012-08-24 MED ORDER — ATENOLOL 25 MG PO TABS: 25.0000 mg | ORAL_TABLET | Freq: Every day | ORAL | Status: DC

## 2012-08-24 NOTE — Assessment & Plan Note (Signed)
Refilled Ultram today

## 2012-08-24 NOTE — Assessment & Plan Note (Signed)
Doing well--continue to work on diet.  Labs today

## 2012-08-24 NOTE — Assessment & Plan Note (Signed)
BP well-controlled -Continue current regimen 

## 2012-08-24 NOTE — Assessment & Plan Note (Addendum)
Check lipids today--needs to change from lipitor secondary to drug coverage at [facility]

## 2012-08-24 NOTE — Patient Instructions (Addendum)
Hypertension As your heart beats, it forces blood through your arteries. This force is your blood pressure. If the pressure is too high, it is called hypertension (HTN) or high blood pressure. HTN is dangerous because you may have it and not know it. High blood pressure may mean that your heart has to work harder to pump blood. Your arteries may be narrow or stiff. The extra work puts you at risk for heart disease, stroke, and other problems.  Blood pressure consists of two numbers, a higher number over a lower, 110/72, for example. It is stated as "110 over 72." The ideal is below 120 for the top number (systolic) and under 80 for the bottom (diastolic). Write down your blood pressure today. You should pay close attention to your blood pressure if you have certain conditions such as:  Heart failure.  Prior heart attack.  Diabetes  Chronic kidney disease.  Prior stroke.  Multiple risk factors for heart disease. To see if you have HTN, your blood pressure should be measured while you are seated with your arm held at the level of the heart. It should be measured at least twice. A one-time elevated blood pressure reading (especially in the Emergency Department) does not mean that you need treatment. There may be conditions in which the blood pressure is different between your right and left arms. It is important to see your caregiver soon for a recheck. Most people have essential hypertension which means that there is not a specific cause. This type of high blood pressure may be lowered by changing lifestyle factors such as:  Stress.  Smoking.  Lack of exercise.  Excessive weight.  Drug/tobacco/alcohol use.  Eating less salt. Most people do not have symptoms from high blood pressure until it has caused damage to the body. Effective treatment can often prevent, delay or reduce that damage. TREATMENT  When a cause has been identified, treatment for high blood pressure is directed at the  cause. There are a large number of medications to treat HTN. These fall into several categories, and your caregiver will help you select the medicines that are best for you. Medications may have side effects. You should review side effects with your caregiver. If your blood pressure stays high after you have made lifestyle changes or started on medicines,   Your medication(s) may need to be changed.  Other problems may need to be addressed.  Be certain you understand your prescriptions, and know how and when to take your medicine.  Be sure to follow up with your caregiver within the time frame advised (usually within two weeks) to have your blood pressure rechecked and to review your medications.  If you are taking more than one medicine to lower your blood pressure, make sure you know how and at what times they should be taken. Taking two medicines at the same time can result in blood pressure that is too low. SEEK IMMEDIATE MEDICAL CARE IF:  You develop a severe headache, blurred or changing vision, or confusion.  You have unusual weakness or numbness, or a faint feeling.  You have severe chest or abdominal pain, vomiting, or breathing problems. MAKE SURE YOU:   Understand these instructions.  Will watch your condition.  Will get help right away if you are not doing well or get worse. Document Released: 08/29/2005 Document Revised: 11/21/2011 Document Reviewed: 04/18/2008 [facility] Patient Information 2013 [place], [place]. Diabetes and Exercise Regular exercise is important and can help:   Control blood glucose (sugar).  Decrease blood pressure.    Control blood lipids (cholesterol, triglycerides).  Improve overall health. BENEFITS FROM EXERCISE  Improved fitness.  Improved flexibility.  Improved endurance.  Increased bone density.  Weight control.  Increased muscle strength.  Decreased body fat.  Improvement of the body's use of insulin, a  hormone.  Increased insulin sensitivity.  Reduction of insulin needs.  Reduced stress and tension.  Helps you feel better. People with diabetes who add exercise to their lifestyle gain additional benefits, including:  Weight loss.  Reduced appetite.  Improvement of the body's use of blood glucose.  Decreased risk factors for heart disease:  Lowering of cholesterol and triglycerides.  Raising the level of good cholesterol (high-density lipoproteins, HDL).  Lowering blood sugar.  Decreased blood pressure. TYPE 1 DIABETES AND EXERCISE  Exercise will usually lower your blood glucose.  If blood glucose is greater than 240 mg/dl, check urine ketones. If ketones are present, do not exercise.  Location of the insulin injection sites may need to be adjusted with exercise. Avoid injecting insulin into areas of the body that will be exercised. For example, avoid injecting insulin into:  The arms when playing tennis.  The legs when jogging. For more information, discuss this with your caregiver.  Keep a record of:  Food intake.  Type and amount of exercise.  Expected peak times of insulin action.  Blood glucose levels. Do this before, during, and after exercise. Review your records with your caregiver. This will help you to develop guidelines for adjusting food intake and insulin amounts.  TYPE 2 DIABETES AND EXERCISE  Regular physical activity can help control blood glucose.  Exercise is important because it may:  Increase the body's sensitivity to insulin.  Improve blood glucose control.  Exercise reduces the risk of heart disease. It decreases serum cholesterol and triglycerides. It also lowers blood pressure.  Those who take insulin or oral hypoglycemic agents should watch for signs of hypoglycemia. These signs include dizziness, shaking, sweating, chills, and confusion.  Body water is lost during exercise. It must be replaced. This will help to avoid loss of  body fluids (dehydration) or heat stroke. Be sure to talk to your caregiver before starting an exercise program to make sure it is safe for you. Remember, any activity is better than none.  Document Released: 11/19/2003 Document Revised: 11/21/2011 Document Reviewed: 03/05/2009 [facility] Patient Information 2013 [place], [place]. Smoking Cessation Quitting smoking is important to your health and has many advantages. However, it is not always easy to quit since nicotine is a very addictive drug. Often times, people try 3 times or more before being able to quit. This document explains the best ways for you to prepare to quit smoking. Quitting takes hard work and a lot of effort, but you can do it. ADVANTAGES OF QUITTING SMOKING  You will live longer, feel better, and live better.  Your body will feel the impact of quitting smoking almost immediately.  Within 20 minutes, blood pressure decreases. Your pulse returns to its normal level.  After 8 hours, carbon monoxide levels in the blood return to normal. Your oxygen level increases.  After 24 hours, the chance of having a heart attack starts to decrease. Your breath, hair, and body stop smelling like smoke.  After 48 hours, damaged nerve endings begin to recover. Your sense of taste and smell improve.  After 72 hours, the body is virtually free of nicotine. Your bronchial tubes relax and breathing becomes easier.  After 2 to 12  weeks, lungs can hold more air. Exercise becomes easier and circulation improves.  The risk of having a heart attack, stroke, cancer, or lung disease is greatly reduced.  After 1 year, the risk of coronary heart disease is cut in half.  After 5 years, the risk of stroke falls to the same as a nonsmoker.  After 10 years, the risk of lung cancer is cut in half and the risk of other cancers decreases significantly.  After 15 years, the risk of coronary heart disease drops, usually to the level of a nonsmoker.  If you  are pregnant, quitting smoking will improve your chances of having a healthy baby.  The people you live with, especially any children, will be healthier.  You will have extra money to spend on things other than cigarettes. QUESTIONS TO THINK ABOUT BEFORE ATTEMPTING TO QUIT You may want to talk about your answers with your caregiver.  Why do you want to quit?  If you tried to quit in the past, what helped and what did not?  What will be the most difficult situations for you after you quit? How will you plan to handle them?  Who can help you through the tough times? Your family? Friends? A caregiver?  What pleasures do you get from smoking? What ways can you still get pleasure if you quit? Here are some questions to ask your caregiver:  How can you help me to be successful at quitting?  What medicine do you think would be best for me and how should I take it?  What should I do if I need more help?  What is smoking withdrawal like? How can I get information on withdrawal? GET READY  Set a quit date.  Change your environment by getting rid of all cigarettes, ashtrays, matches, and lighters in your home, car, or work. Do not let people smoke in your home.  Review your past attempts to quit. Think about what worked and what did not. GET SUPPORT AND ENCOURAGEMENT You have a better chance of being successful if you have help. You can get support in many ways.  Tell your family, friends, and co-workers that you are going to quit and need their support. Ask them not to smoke around you.  Get individual, group, or telephone counseling and support. Programs are available at [employer] and health centers. Call your local health department for information about programs in your area.  Spiritual beliefs and practices may help some smokers quit.  Download a "quit meter" on your computer to keep track of quit statistics, such as how long you have gone without smoking, cigarettes not  smoked, and money saved.  Get a self-help book about quitting smoking and staying off of tobacco. LEARN NEW SKILLS AND BEHAVIORS  Distract yourself from urges to smoke. Talk to someone, go for a walk, or occupy your time with a task.  Change your normal routine. Take a different route to work. Drink tea instead of coffee. Eat breakfast in a different place.  Reduce your stress. Take a hot bath, exercise, or read a book.  Plan something enjoyable to do every day. Reward yourself for not smoking.  Explore interactive web-based programs that specialize in helping you quit. GET MEDICINE AND USE IT CORRECTLY Medicines can help you stop smoking and decrease the urge to smoke. Combining medicine with the above behavioral methods and support can greatly increase your chances of successfully quitting smoking.  Nicotine replacement therapy helps deliver nicotine to your  body without the negative effects and risks of smoking. Nicotine replacement therapy includes nicotine gum, lozenges, inhalers, nasal sprays, and skin patches. Some may be available over-the-counter and others require a prescription.  Antidepressant medicine helps people abstain from smoking, but how this works is unknown. This medicine is available by prescription.  Nicotinic receptor partial agonist medicine simulates the effect of nicotine in your brain. This medicine is available by prescription. Ask your caregiver for advice about which medicines to use and how to use them based on your health history. Your caregiver will tell you what side effects to look out for if you choose to be on a medicine or therapy. Carefully read the information on the package. Do not use any other product containing nicotine while using a nicotine replacement product.  RELAPSE OR DIFFICULT SITUATIONS Most relapses occur within the first 3 months after quitting. Do not be discouraged if you start smoking again. Remember, most people try several times  before finally quitting. You may have symptoms of withdrawal because your body is used to nicotine. You may crave cigarettes, be irritable, feel very hungry, cough often, get headaches, or have difficulty concentrating. The withdrawal symptoms are only temporary. They are strongest when you first quit, but they will go away within 10 14 days. To reduce the chances of relapse, try to:  Avoid drinking alcohol. Drinking lowers your chances of successfully quitting.  Reduce the amount of caffeine you consume. Once you quit smoking, the amount of caffeine in your body increases and can give you symptoms, such as a rapid heartbeat, sweating, and anxiety.  Avoid smokers because they can make you want to smoke.  Do not let weight gain distract you. Many smokers will gain weight when they quit, usually less than 10 pounds. Eat a healthy diet and stay active. You can always lose the weight gained after you quit.  Find ways to improve your mood other than smoking. FOR MORE INFORMATION  www.smokefree.gov  Document Released: 08/23/2001 Document Revised: 02/28/2012 Document Reviewed: 12/08/2011 [facility] Patient Information 2013 [place], [place].

## 2012-08-24 NOTE — Progress Notes (Signed)
  Subjective:    Patient ID: [name] [name], male    DOB: [birth year], 51 y.o.   MRN: [id number]  HPI Here today.  Has received [facility] card. Needs blood work and immunization update.  Also, complains of dental caries and needing dental work done.  Needs medications refilled.  To try the MAP program for all medications.  Has plenty of insulin.  Gets heart med through cards.  To continue to work on disability.     Review of Systems  Constitutional: Negative for fever and chills.  HENT: Positive for neck pain and dental problem.   Respiratory: Positive for shortness of breath. Negative for chest tightness.   Cardiovascular: Negative for chest pain and leg swelling.  Gastrointestinal: Negative for nausea, vomiting and abdominal pain.  Musculoskeletal: Positive for back pain and arthralgias.       Objective:   Physical Exam  Vitals reviewed. Constitutional: He appears well-developed and well-nourished.  HENT:  Head: Normocephalic and atraumatic.  Eyes: No scleral icterus.  Neck: Neck supple.  Cardiovascular: Normal rate.   Pulmonary/Chest: Effort normal.  Abdominal: Soft.          Assessment & Plan:

## 2012-08-27 ENCOUNTER — Encounter (HOSPITAL_COMMUNITY)
Admission: RE | Admit: 2012-08-27 | Discharge: 2012-08-27 | Disposition: A | Discharge status: Home / Self Care | Payer: Self-pay | Source: Ambulatory Visit | Attending: Cardiology | Admitting: Cardiology

## 2012-08-27 ENCOUNTER — Encounter: Payer: Self-pay | Admitting: Family Medicine

## 2012-08-27 LAB — GLUCOSE, CAPILLARY: Glucose-Capillary: 363 mg/dL — ABNORMAL HIGH (ref 70–99)

## 2012-08-27 NOTE — Progress Notes (Addendum)
CBG 363 this afternoon.  No further exercise per protocol.  [name] [name] said he ate a chicken sandwich, an apple pie and a milkshake prior to exercise this afternoon.  Patient says he took all of his medications late.  Dr Virl Cagey office called and notified. Patient instructed to make good food choices. Patient given 2 glasses of water to drink. [name] [name] left without giving a urine sample to check urine for ketones or to recheck CBG.  Dr Tawni Levy office called and noticed spoke with [facility].  Today was [name] [name]'s last day of exercise.  [name] [name] is going to [place] to visit relatives.

## 2012-08-28 ENCOUNTER — Telehealth: Payer: Self-pay | Admitting: *Deleted

## 2012-08-28 NOTE — Telephone Encounter (Signed)
Message left on our office voicemail.  Patient had his last appt at [facility].  CBG around 3:30pm---363.  Patient had [employer] (chicken, milkshake, apple pie, sweet tea).  Patient was counseled on making healthier food choices.  Will check U/A for ketones and have patient increase H2O intake.  Patient left office without rechecking CBG and urine.  Notes faxed to our office and placed in Dr. Tawni Levy box.  Gaylene Brooks, RN

## 2012-08-29 ENCOUNTER — Encounter (HOSPITAL_COMMUNITY): Payer: Self-pay

## 2012-08-31 ENCOUNTER — Encounter (HOSPITAL_COMMUNITY): Payer: Self-pay

## 2012-09-03 ENCOUNTER — Encounter (HOSPITAL_COMMUNITY): Payer: Self-pay

## 2012-09-07 ENCOUNTER — Encounter (HOSPITAL_COMMUNITY): Payer: Self-pay

## 2012-09-20 ENCOUNTER — Telehealth: Payer: Self-pay | Admitting: Cardiology

## 2012-09-20 NOTE — Telephone Encounter (Signed)
Pt calling for samples of effient, ok to leave a message

## 2012-09-20 NOTE — Telephone Encounter (Signed)
Patient called no answer.Left message on personal voice mail office out of effient samples.

## 2012-09-25 ENCOUNTER — Telehealth: Payer: Self-pay | Admitting: Cardiology

## 2012-09-25 NOTE — Telephone Encounter (Signed)
Patient called was told office out of effient samples.

## 2012-09-25 NOTE — Telephone Encounter (Signed)
New Problem:    Patient called in wanting to know if he could come in tomorrow to pick up 6-8 boxes of prasugrel (EFFIENT) 10 MG TABS samples.  Please call back.

## 2012-09-29 ENCOUNTER — Emergency Department (HOSPITAL_COMMUNITY): Payer: No Typology Code available for payment source

## 2012-09-29 ENCOUNTER — Emergency Department (HOSPITAL_COMMUNITY)
Admission: EM | Admit: 2012-09-29 | Discharge: 2012-09-29 | Disposition: A | Discharge status: Home / Self Care | Payer: No Typology Code available for payment source | Attending: Emergency Medicine | Admitting: Emergency Medicine

## 2012-09-29 ENCOUNTER — Encounter (HOSPITAL_COMMUNITY): Payer: Self-pay | Admitting: *Deleted

## 2012-09-29 DIAGNOSIS — Z794 Long term (current) use of insulin: Secondary | ICD-10-CM | POA: Insufficient documentation

## 2012-09-29 DIAGNOSIS — F17200 Nicotine dependence, unspecified, uncomplicated: Secondary | ICD-10-CM | POA: Insufficient documentation

## 2012-09-29 DIAGNOSIS — G4733 Obstructive sleep apnea (adult) (pediatric): Secondary | ICD-10-CM | POA: Insufficient documentation

## 2012-09-29 DIAGNOSIS — Z8669 Personal history of other diseases of the nervous system and sense organs: Secondary | ICD-10-CM | POA: Insufficient documentation

## 2012-09-29 DIAGNOSIS — I10 Essential (primary) hypertension: Secondary | ICD-10-CM | POA: Insufficient documentation

## 2012-09-29 DIAGNOSIS — E785 Hyperlipidemia, unspecified: Secondary | ICD-10-CM | POA: Insufficient documentation

## 2012-09-29 DIAGNOSIS — Z7982 Long term (current) use of aspirin: Secondary | ICD-10-CM | POA: Insufficient documentation

## 2012-09-29 DIAGNOSIS — E119 Type 2 diabetes mellitus without complications: Secondary | ICD-10-CM | POA: Insufficient documentation

## 2012-09-29 DIAGNOSIS — R079 Chest pain, unspecified: Secondary | ICD-10-CM | POA: Insufficient documentation

## 2012-09-29 DIAGNOSIS — Z79899 Other long term (current) drug therapy: Secondary | ICD-10-CM | POA: Insufficient documentation

## 2012-09-29 DIAGNOSIS — I2510 Atherosclerotic heart disease of native coronary artery without angina pectoris: Secondary | ICD-10-CM | POA: Insufficient documentation

## 2012-09-29 LAB — BASIC METABOLIC PANEL
Calcium: 9.9 mg/dL (ref 8.4–10.5)
Chloride: 98 mEq/L (ref 96–112)
Creatinine, Ser: 0.58 mg/dL (ref 0.50–1.35)
GFR calc Af Amer: >90 mL/min (ref >90)
Sodium: 135 mEq/L (ref 135–145)

## 2012-09-29 LAB — CBC
MCV: 88.7 fL (ref 78.0–100.0)
Platelets: 275 10*3/uL (ref 150–400)
RDW: 12.4 % (ref 11.5–15.5)
WBC: 12 10*3/uL — ABNORMAL HIGH (ref 4.0–10.5)

## 2012-09-29 LAB — TROPONIN I
Troponin I: <0.3 ng/mL (ref <0.30)
Troponin I: <0.3 ng/mL (ref <0.30)

## 2012-09-29 MED ORDER — SODIUM CHLORIDE 0.9 % IV SOLN
INTRAVENOUS | Status: DC
  Administered 2012-09-29: 13:00:00 via INTRAVENOUS

## 2012-09-29 MED ORDER — SODIUM CHLORIDE 0.9 % IV BOLUS (SEPSIS)
500.0000 mL | Freq: Once | INTRAVENOUS | Status: AC
  Administered 2012-09-29: 500 mL via INTRAVENOUS

## 2012-09-29 NOTE — ED Notes (Signed)
0330 started with chest pain  On rt sided, no SHOB, no Sweating, feels light headed, more concerned about "bumps on arms" pain is on and off 8/10 pain non radiating, nausea with symptoms

## 2012-09-29 NOTE — ED Provider Notes (Signed)
History     CSN: [id number]  Arrival date & time 09/29/12  1052   First MD Initiated Contact with Patient 09/29/12 1133      Chief Complaint  Patient presents with  . Chest Pain    (Consider location/radiation/quality/duration/timing/severity/associated sxs/prior treatment) HPI Comments: [name] [name] is a 52 y.o. Male who presents for evaluation of chest pain this morning. The pain started at 3 AM, and awoke him from sleep. It has been intermittent, lasting several minutes at a time. It is dull and right-sided. There is no associated shortness of breath and wheezing, or fever. He does have cough, productive of a brown-colored sputum. He has this problem intermittently. He uses CPAP, at night. He continues to smoke cigarettes. He had a cardiac catheter with stent last year. There are no known modifying factors.  Patient is a 52 y.o. male presenting with chest pain. The history is provided by the patient.  Chest Pain     Past Medical History  Diagnosis Date  . Hypertension   . Hyperlipidemia   . DM (diabetes mellitus)   . OSA on CPAP 01/19/2012  . CAD (coronary artery disease) 01/18/12    BMS to RCA    Past Surgical History  Procedure Date  . No past surgeries   . Coronary stent placement     Family History  Problem Relation Age of Onset  . Heart attack Father 73  . Heart attack Brother   . Stroke Brother   . Cancer Mother     breast  . Hypertension Sister   . Hyperlipidemia Sister   . Hyperlipidemia Sister   . Hypertension Sister     History  Substance Use Topics  . Smoking status: Current Some Day Smoker -- 0.5 packs/day for 35 years    Types: Cigarettes  . Smokeless tobacco: Never Used  . Alcohol Use: Yes     Comment: 12 beers twice a week.      Review of Systems  Cardiovascular: Positive for chest pain.  All other systems reviewed and are negative.    Allergies  Review of patient's allergies indicates no known allergies.  Home Medications    Current Outpatient Rx  Name  Route  Sig  Dispense  Refill  . ASPIRIN 81 MG PO CHEW   Oral   Chew 1 tablet (81 mg total) by mouth daily.   30 tablet   5   . ATENOLOL 25 MG PO TABS   Oral   Take 1 tablet (25 mg total) by mouth daily.   30 tablet   5   . OMEGA-3 FATTY ACIDS 1000 MG PO CAPS   Oral   Take 1 g by mouth 2 (two) times daily.         Marland Kitchen GLIPIZIDE 10 MG PO TABS   Oral   Take 1 tablet (10 mg total) by mouth daily.   30 tablet   5   . INSULIN GLARGINE 100 UNIT/ML [place] SOLN   Subcutaneous   Inject 30 Units into the skin 2 (two) times daily.   10 mL   5   . LISINOPRIL 5 MG PO TABS   Oral   Take 0.5 tablets (2.5 mg total) by mouth 2 (two) times daily.   30 tablet   5   . METFORMIN HCL 1000 MG PO TABS   Oral   Take 1 tablet (1,000 mg total) by mouth 2 (two) times daily with a meal.   60 tablet   5   .  PRASUGREL HCL 10 MG PO TABS   Oral   Take 1 tablet (10 mg total) by mouth daily.   30 tablet   5   . SIMVASTATIN 20 MG PO TABS   Oral   Take 20 mg by mouth at bedtime.         . TRAMADOL HCL 50 MG PO TABS   Oral   Take 1 tablet (50 mg total) by mouth every 6 (six) hours as needed for pain.   45 tablet   3   . INSULIN SYRINGE-NEEDLE U-100 31G X 5/16" 1 ML MISC   Does not apply   1 Units by Does not apply route as directed.   100 each   5     BP 104/60  Pulse 63  Temp 98.1 F (36.7 C) (Oral)  Resp 16  SpO2 96%  Physical Exam  Nursing note and vitals reviewed. Constitutional: He is oriented to person, place, and time. He appears well-developed and well-nourished.  HENT:  Head: Normocephalic and atraumatic.  Right Ear: External ear normal.  Left Ear: External ear normal.  Eyes: Conjunctivae normal and EOM are normal. Pupils are equal, round, and reactive to light.  Neck: Normal range of motion and phonation normal. Neck supple.  Cardiovascular: Normal rate, regular rhythm, normal heart sounds and intact distal pulses.    Pulmonary/Chest: Effort normal and breath sounds normal. He exhibits no bony tenderness.       Tender right anterior chest wall.  Abdominal: Soft. Normal appearance. There is no tenderness.  Musculoskeletal: Normal range of motion.  Neurological: He is alert and oriented to person, place, and time. He has normal strength. No cranial nerve deficit or sensory deficit. He exhibits normal muscle tone. Coordination normal.  Skin: Skin is warm, dry and intact.  Psychiatric: He has a normal mood and affect. His behavior is normal. Judgment and thought content normal.    ED Course  Procedures (including critical care time)  Initial BP, normal. Repeat low 96/50. IV fluid bolus given.   Reevaluation: 16:05- he has been able to walk in the hall without oxygen and maintaining normal oxygenation. Patient states that he has no chest pain. Repeat vital signs show stabilized normal blood pressure.     Date: 06/29/2012  Rate: 70  Rhythm: normal sinus rhythm  QRS Axis: normal  PR and QT Intervals: normal  ST/T Wave abnormalities: normal  PR and QRS Conduction Disutrbances:none  Narrative Interpretation:   Old EKG Reviewed: unchanged   CRITICAL CARE Performed by: Mancel Bale L   Total critical care time: 35 minutes  Critical care time was exclusive of separately billable procedures and treating other patients.  Critical care was necessary to treat or prevent imminent or life-threatening deterioration.  Critical care was time spent personally by me on the following activities: development of treatment plan with patient and/or surrogate as well as nursing, discussions with consultants, evaluation of patient's response to treatment, examination of patient, obtaining history from patient or surrogate, ordering and performing treatments and interventions, ordering and review of laboratory studies, ordering and review of radiographic studies, pulse oximetry and re-evaluation of patient's  condition.  Labs Reviewed  CBC - Abnormal; Notable for the following:    WBC 12.0 (*)     All other components within normal limits  BASIC METABOLIC PANEL - Abnormal; Notable for the following:    Glucose, Bld 280 (*)     All other components within normal limits  TROPONIN I  TROPONIN I  Dg Chest Port 1 View  09/29/2012  *RADIOLOGY REPORT*  Clinical Data: Chest pain, shortness of breath  PORTABLE CHEST - 1 VIEW  Comparison: 02/12/2012; 01/18/2012; 03/12/2008  Findings: Grossly unchanged enlarged cardiac silhouette and mediastinal contours.  Chronic pulmonary venous congestion without frank evidence of edema.  No focal airspace opacity.  No pleural effusion or pneumothorax.  Unchanged bones.  IMPRESSION: Chronic pulmonary venous congestion without frank evidence of edema.   Original Report Authenticated By: Tacey Ruiz, MD    Nursing notes, applicable records and vitals reviewed.  Radiologic Images/Reports reviewed.   1. Chest pain       MDM  Nonspecific chest pain, with negative workup in ED, for cardiac and acute respiratory difficulty. Doubt pneumonia, PE, ACS. Doubt metabolic instability, serious bacterial infection or impending vascular collapse; the patient is stable for discharge.     Plan: Home Medications- usual; Home Treatments- rest; Recommended follow up- PCP, one week, for a checkup     Flint Melter, MD 09/29/12 [phone]

## 2012-10-01 ENCOUNTER — Telehealth: Payer: Self-pay | Admitting: Cardiology

## 2012-10-01 MED ORDER — PRASUGREL HCL 10 MG PO TABS: 10.0000 mg | ORAL_TABLET | Freq: Every day | ORAL | Status: DC

## 2012-10-01 NOTE — Telephone Encounter (Signed)
Spoke to patient was told will send in effient prescription to [facility].

## 2012-10-01 NOTE — Telephone Encounter (Signed)
New Problem:     Patient called in wantign to know how to proceed because our office is still out of effient.  Please call back.

## 2012-10-12 ENCOUNTER — Ambulatory Visit (INDEPENDENT_AMBULATORY_CARE_PROVIDER_SITE_OTHER): Payer: No Typology Code available for payment source | Admitting: Family Medicine

## 2012-10-12 ENCOUNTER — Telehealth: Payer: Self-pay | Admitting: Cardiology

## 2012-10-12 ENCOUNTER — Encounter: Payer: Self-pay | Admitting: Family Medicine

## 2012-10-12 VITALS — BP 151/83 | HR 106 | Temp 98.9°F | Ht 66.5 in | Wt 273.0 lb

## 2012-10-12 DIAGNOSIS — Z720 Tobacco use: Secondary | ICD-10-CM

## 2012-10-12 DIAGNOSIS — T148 Other injury of unspecified body region: Secondary | ICD-10-CM

## 2012-10-12 DIAGNOSIS — E119 Type 2 diabetes mellitus without complications: Secondary | ICD-10-CM

## 2012-10-12 DIAGNOSIS — W57XXXA Bitten or stung by nonvenomous insect and other nonvenomous arthropods, initial encounter: Secondary | ICD-10-CM

## 2012-10-12 DIAGNOSIS — F17200 Nicotine dependence, unspecified, uncomplicated: Secondary | ICD-10-CM

## 2012-10-12 DIAGNOSIS — E669 Obesity, unspecified: Secondary | ICD-10-CM

## 2012-10-12 DIAGNOSIS — K029 Dental caries, unspecified: Secondary | ICD-10-CM

## 2012-10-12 DIAGNOSIS — L84 Corns and callosities: Secondary | ICD-10-CM

## 2012-10-12 MED ORDER — TRIAMCINOLONE ACETONIDE 0.1 % EX CREA: TOPICAL_CREAM | Freq: Two times a day (BID) | CUTANEOUS | Status: DC

## 2012-10-12 NOTE — Assessment & Plan Note (Signed)
Still working on quitting smoking

## 2012-10-12 NOTE — Telephone Encounter (Signed)
**Note De-Identified  Obfuscation** Pt is advised that samples of Effient is at front desk and he may pick up, he verbalized understanding and states he will pick up by 5 pm today.

## 2012-10-12 NOTE — Patient Instructions (Signed)
Smoking Cessation Quitting smoking is important to your health and has many advantages. However, it is not always easy to quit since nicotine is a very addictive drug. Often times, people try 3 times or more before being able to quit. This document explains the best ways for you to prepare to quit smoking. Quitting takes hard work and a lot of effort, but you can do it. ADVANTAGES OF QUITTING SMOKING  You will live longer, feel better, and live better.  Your body will feel the impact of quitting smoking almost immediately.  Within 20 minutes, blood pressure decreases. Your pulse returns to its normal level.  After 8 hours, carbon monoxide levels in the blood return to normal. Your oxygen level increases.  After 24 hours, the chance of having a heart attack starts to decrease. Your breath, hair, and body stop smelling like smoke.  After 48 hours, damaged nerve endings begin to recover. Your sense of taste and smell improve.  After 72 hours, the body is virtually free of nicotine. Your bronchial tubes relax and breathing becomes easier.  After 2 to 12 weeks, lungs can hold more air. Exercise becomes easier and circulation improves.  The risk of having a heart attack, stroke, cancer, or lung disease is greatly reduced.  After 1 year, the risk of coronary heart disease is cut in half.  After 5 years, the risk of stroke falls to the same as a nonsmoker.  After 10 years, the risk of lung cancer is cut in half and the risk of other cancers decreases significantly.  After 15 years, the risk of coronary heart disease drops, usually to the level of a nonsmoker.  If you are pregnant, quitting smoking will improve your chances of having a healthy baby.  The people you live with, especially any children, will be healthier.  You will have extra money to spend on things other than cigarettes. QUESTIONS TO THINK ABOUT BEFORE ATTEMPTING TO QUIT You may want to talk about your answers with your  caregiver.  Why do you want to quit?  If you tried to quit in the past, what helped and what did not?  What will be the most difficult situations for you after you quit? How will you plan to handle them?  Who can help you through the tough times? Your family? Friends? A caregiver?  What pleasures do you get from smoking? What ways can you still get pleasure if you quit? Here are some questions to ask your caregiver:  How can you help me to be successful at quitting?  What medicine do you think would be best for me and how should I take it?  What should I do if I need more help?  What is smoking withdrawal like? How can I get information on withdrawal? GET READY  Set a quit date.  Change your environment by getting rid of all cigarettes, ashtrays, matches, and lighters in your home, car, or work. Do not let people smoke in your home.  Review your past attempts to quit. Think about what worked and what did not. GET SUPPORT AND ENCOURAGEMENT You have a better chance of being successful if you have help. You can get support in many ways.  Tell your family, friends, and co-workers that you are going to quit and need their support. Ask them not to smoke around you.  Get individual, group, or telephone counseling and support. Programs are available at local hospitals and health centers. Call your local health department for   information about programs in your area.  Spiritual beliefs and practices may help some smokers quit.  Download a "quit meter" on your computer to keep track of quit statistics, such as how long you have gone without smoking, cigarettes not smoked, and money saved.  Get a self-help book about quitting smoking and staying off of tobacco. LEARN NEW SKILLS AND BEHAVIORS  Distract yourself from urges to smoke. Talk to someone, go for a walk, or occupy your time with a task.  Change your normal routine. Take a different route to work. Drink tea instead of coffee.  Eat breakfast in a different place.  Reduce your stress. Take a hot bath, exercise, or read a book.  Plan something enjoyable to do every day. Reward yourself for not smoking.  Explore interactive web-based programs that specialize in helping you quit. GET MEDICINE AND USE IT CORRECTLY Medicines can help you stop smoking and decrease the urge to smoke. Combining medicine with the above behavioral methods and support can greatly increase your chances of successfully quitting smoking.  Nicotine replacement therapy helps deliver nicotine to your body without the negative effects and risks of smoking. Nicotine replacement therapy includes nicotine gum, lozenges, inhalers, nasal sprays, and skin patches. Some may be available over-the-counter and others require a prescription.  Antidepressant medicine helps people abstain from smoking, but how this works is unknown. This medicine is available by prescription.  Nicotinic receptor partial agonist medicine simulates the effect of nicotine in your brain. This medicine is available by prescription. Ask your caregiver for advice about which medicines to use and how to use them based on your health history. Your caregiver will tell you what side effects to look out for if you choose to be on a medicine or therapy. Carefully read the information on the package. Do not use any other product containing nicotine while using a nicotine replacement product.  RELAPSE OR DIFFICULT SITUATIONS Most relapses occur within the first 3 months after quitting. Do not be discouraged if you start smoking again. Remember, most people try several times before finally quitting. You may have symptoms of withdrawal because your body is used to nicotine. You may crave cigarettes, be irritable, feel very hungry, cough often, get headaches, or have difficulty concentrating. The withdrawal symptoms are only temporary. They are strongest when you first quit, but they will go away within  10 14 days. To reduce the chances of relapse, try to:  Avoid drinking alcohol. Drinking lowers your chances of successfully quitting.  Reduce the amount of caffeine you consume. Once you quit smoking, the amount of caffeine in your body increases and can give you symptoms, such as a rapid heartbeat, sweating, and anxiety.  Avoid smokers because they can make you want to smoke.  Do not let weight gain distract you. Many smokers will gain weight when they quit, usually less than 10 pounds. Eat a healthy diet and stay active. You can always lose the weight gained after you quit.  Find ways to improve your mood other than smoking. FOR MORE INFORMATION  www.smokefree.gov  Document Released: 08/23/2001 Document Revised: 02/28/2012 Document Reviewed: 12/08/2011 [facility] Patient Information 2013 [place], [place]. Calorie Counting Diet A calorie counting diet requires you to eat the number of calories that are right for you in a day. Calories are the measurement of how much energy you get from the food you eat. Eating the right amount of calories is important for staying at a healthy weight. If you eat too many calories,  your body will store them as fat and you may gain weight. If you eat too few calories, you may lose weight. Counting the number of calories you eat during a day will help you know if you are eating the right amount. A Registered Dietitian can determine how many calories you need in a day. The amount of calories needed varies from person to person. If your goal is to lose weight, you will need to eat fewer calories. Losing weight can benefit you if you are overweight or have health problems such as heart disease, high blood pressure, or diabetes. If your goal is to gain weight, you will need to eat more calories. Gaining weight may be necessary if you have a certain health problem that causes your body to need more energy. TIPS Whether you are increasing or decreasing the number of calories  you eat during a day, it may be hard to get used to changes in what you eat and drink. The following are tips to help you keep track of the number of calories you eat.  Measure foods at home with measuring cups. This helps you know the amount of food and number of calories you are eating.  Restaurants often serve food in amounts that are larger than 1 serving. While eating out, estimate how many servings of a food you are given. For example, a serving of cooked rice is  cup or about the size of half of a fist. Knowing serving sizes will help you be aware of how much food you are eating at restaurants.  Ask for smaller portion sizes or child-size portions at restaurants.  Plan to eat half of a meal at a restaurant. Take the rest home or share the other half with a friend.  Read the Nutrition Facts panel on food labels for calorie content and serving size. You can find out how many servings are in a package, the size of a serving, and the number of calories each serving has.  For example, a package might contain 3 cookies. The Nutrition Facts panel on that package says that 1 serving is 1 cookie. Below that, it will say there are 3 servings in the container. The calories section of the Nutrition Facts label says there are 90 calories. This means there are 90 calories in 1 cookie (1 serving). If you eat 1 cookie you have eaten 90 calories. If you eat all 3 cookies, you have eaten 270 calories (3 servings x 90 calories = 270 calories). The list below tells you how big or small some common portion sizes are.  1 oz.........4 stacked dice.  3 oz........Marland KitchenDeck of cards.  1 tsp.......Marland KitchenTip of little finger.  1 tbs......Marland KitchenMarland KitchenThumb.  2 tbs.......Marland KitchenGolf ball.   cup......Marland KitchenHalf of a fist.  1 cup.......Marland KitchenA fist. KEEP A FOOD LOG Write down every food item you eat, the amount you eat, and the number of calories in each food you eat during the day. At the end of the day, you can add up the total number of  calories you have eaten. It may help to keep a list like the one below. Find out the calorie information by reading the Nutrition Facts panel on food labels. Breakfast  Bran cereal (1 cup, 110 calories).  Fat-free milk ( cup, 45 calories). Snack  Apple (1 medium, 80 calories). Lunch  Spinach (1 cup, 20 calories).  Tomato ( medium, 20 calories).  Chicken breast strips (3 oz, 165 calories).  Shredded cheddar cheese ( cup, 110 calories).  Light [place] dressing (2 tbs, 60 calories).  Whole-wheat bread (1 slice, 80 calories).  Tub margarine (1 tsp, 35 calories).  Vegetable soup (1 cup, 160 calories). Dinner  Pork chop (3 oz, 190 calories).  Brown rice (1 cup, 215 calories).  Steamed broccoli ( cup, 20 calories).  Strawberries (1  cup, 65 calories).  Whipped cream (1 tbs, 50 calories). Daily Calorie Total: 1425 Document Released: 08/29/2005 Document Revised: 11/21/2011 Document Reviewed: 02/23/2007 [facility] Patient Information 2013 [place], [place]. Diabetes and Exercise Regular exercise is important and can help:   Control blood glucose (sugar).  Decrease blood pressure.    Control blood lipids (cholesterol, triglycerides).  Improve overall health. BENEFITS FROM EXERCISE  Improved fitness.  Improved flexibility.  Improved endurance.  Increased bone density.  Weight control.  Increased muscle strength.  Decreased body fat.  Improvement of the body's use of insulin, a hormone.  Increased insulin sensitivity.  Reduction of insulin needs.  Reduced stress and tension.  Helps you feel better. People with diabetes who add exercise to their lifestyle gain additional benefits, including:  Weight loss.  Reduced appetite.  Improvement of the body's use of blood glucose.  Decreased risk factors for heart disease:  Lowering of cholesterol and triglycerides.  Raising the level of good cholesterol (high-density lipoproteins, HDL).  Lowering  blood sugar.  Decreased blood pressure. TYPE 1 DIABETES AND EXERCISE  Exercise will usually lower your blood glucose.  If blood glucose is greater than 240 mg/dl, check urine ketones. If ketones are present, do not exercise.  Location of the insulin injection sites may need to be adjusted with exercise. Avoid injecting insulin into areas of the body that will be exercised. For example, avoid injecting insulin into:  The arms when playing tennis.  The legs when jogging. For more information, discuss this with your caregiver.  Keep a record of:  Food intake.  Type and amount of exercise.  Expected peak times of insulin action.  Blood glucose levels. Do this before, during, and after exercise. Review your records with your caregiver. This will help you to develop guidelines for adjusting food intake and insulin amounts.  TYPE 2 DIABETES AND EXERCISE  Regular physical activity can help control blood glucose.  Exercise is important because it may:  Increase the body's sensitivity to insulin.  Improve blood glucose control.  Exercise reduces the risk of heart disease. It decreases serum cholesterol and triglycerides. It also lowers blood pressure.  Those who take insulin or oral hypoglycemic agents should watch for signs of hypoglycemia. These signs include dizziness, shaking, sweating, chills, and confusion.  Body water is lost during exercise. It must be replaced. This will help to avoid loss of body fluids (dehydration) or heat stroke. Be sure to talk to your caregiver before starting an exercise program to make sure it is safe for you. Remember, any activity is better than none.  Document Released: 11/19/2003 Document Revised: 11/21/2011 Document Reviewed: 03/05/2009 [facility] Patient Information 2013 [place], [place].

## 2012-10-12 NOTE — Progress Notes (Signed)
  Subjective:    Patient ID: [name] [name], male    DOB: 1961-03-08, 52 y.o.   MRN: [id number]  HPI Here today with several complaints.   1.  ED f/u-seen with right sided chest pain.  No evidence of acute MI or other.  CP has resolved. 2.  Needs refill on Prasugrel-plt inhibitor 3.  Needs feet checked 4.  Needs dental referral. 5.  Completed Cardiac rehab 6.  Needs insulin syringes.   Review of Systems  Constitutional: Negative for fever and chills.  Respiratory: Positive for shortness of breath.   Cardiovascular: Negative for chest pain and leg swelling.  Gastrointestinal: Negative for abdominal pain.  Skin: Positive for rash.       Objective:   Physical Exam  Vitals reviewed. Constitutional: He is oriented to person, place, and time. He appears well-developed and well-nourished.  HENT:  Head: Normocephalic and atraumatic.  Eyes: No scleral icterus.  Neck: Neck supple.  Cardiovascular: Normal rate.   Pulmonary/Chest: Effort normal.  Abdominal: Soft.  Neurological: He is alert and oriented to person, place, and time.  Skin: Skin is warm. Rash noted. He is diaphoretic (mildly).       multiple excoriation noted on arms and legs.  None in intertriginous areas.          Assessment & Plan:

## 2012-10-12 NOTE — Assessment & Plan Note (Signed)
Worsening BP and weight gain.  ? Related.

## 2012-10-12 NOTE — Telephone Encounter (Signed)
New Problem     Requesting samples of effient. Pt states he is completely out.

## 2012-10-12 NOTE — Assessment & Plan Note (Signed)
Trial of kenalog cream

## 2012-10-12 NOTE — Assessment & Plan Note (Signed)
Addition of exercise since he stopped cardiac rehab.  He is not taking Novolog, but may need.

## 2012-10-12 NOTE — Assessment & Plan Note (Addendum)
Diabetic socks and new shoes.--No podiatrists are currently taking the orange card.

## 2012-11-12 ENCOUNTER — Telehealth: Payer: Self-pay | Admitting: Cardiology

## 2012-11-12 NOTE — Telephone Encounter (Signed)
LM on pts VM that we do not have any Effient samples at this time and that he may call later this week to see if we have received any.

## 2012-11-12 NOTE — Telephone Encounter (Signed)
New problem   Pt want to know if you have any samples of heart medication Efferdent. Please call him and if he doesn't answer leave him a vm and he will call back

## 2012-11-14 ENCOUNTER — Telehealth: Payer: Self-pay

## 2012-11-14 NOTE — Telephone Encounter (Signed)
Patient called effient 10 mg samples left at front desk 3rd floor.

## 2012-12-06 ENCOUNTER — Ambulatory Visit (INDEPENDENT_AMBULATORY_CARE_PROVIDER_SITE_OTHER): Payer: No Typology Code available for payment source | Admitting: Cardiology

## 2012-12-06 ENCOUNTER — Encounter: Payer: Self-pay | Admitting: Cardiology

## 2012-12-06 VITALS — BP 106/72 | HR 88 | Ht 66.5 in | Wt 274.4 lb

## 2012-12-06 DIAGNOSIS — Z720 Tobacco use: Secondary | ICD-10-CM

## 2012-12-06 DIAGNOSIS — F17200 Nicotine dependence, unspecified, uncomplicated: Secondary | ICD-10-CM

## 2012-12-06 DIAGNOSIS — G4733 Obstructive sleep apnea (adult) (pediatric): Secondary | ICD-10-CM

## 2012-12-06 DIAGNOSIS — I10 Essential (primary) hypertension: Secondary | ICD-10-CM

## 2012-12-06 DIAGNOSIS — I2510 Atherosclerotic heart disease of native coronary artery without angina pectoris: Secondary | ICD-10-CM

## 2012-12-06 DIAGNOSIS — E785 Hyperlipidemia, unspecified: Secondary | ICD-10-CM

## 2012-12-06 MED ORDER — PRASUGREL HCL 10 MG PO TABS: 10.0000 mg | ORAL_TABLET | Freq: Every day | ORAL | Status: DC

## 2012-12-06 NOTE — Patient Instructions (Signed)
Continue Effient for one more month then discontinue.  Stay on all your other medications.  I think your shortness of breath is related to your weight, sleep apnea, and continued smoking.  I will see you in 6 months.

## 2012-12-06 NOTE — Progress Notes (Signed)
[name] [name] Date of Birth: 1961-08-08 Medical Record #[id number]  History of Present Illness: [name] is seen for followup today. He has a history of coronary disease and is status post stenting of the right coronary in May of 2013. He has been maintained on aspirin and Effient. He denies any recurrent chest, jaw, or arm pain. He does complain of shortness of breath that is chronic. It is worse with exertion.  He does have a history of sleep apnea and is on CPAP therapy. He has diabetes which has been poorly controlled. He continues to smoke. He has been turned down for disability. His only complaint today is that he occasionally feels nervous in his chest and has "chills".  Current Outpatient Prescriptions on File Prior to Visit  Medication Sig Dispense Refill  . aspirin 81 MG chewable tablet Chew 1 tablet (81 mg total) by mouth daily.  30 tablet  5  . atenolol (TENORMIN) 25 MG tablet Take 1 tablet (25 mg total) by mouth daily.  30 tablet  5  . fish oil-omega-3 fatty acids 1000 MG capsule Take 1 g by mouth 2 (two) times daily.      Marland Kitchen glipiZIDE (GLUCOTROL) 10 MG tablet Take 1 tablet (10 mg total) by mouth daily.  30 tablet  5  . insulin glargine (LANTUS) 100 UNIT/ML injection Inject 30 Units into the skin 2 (two) times daily.  10 mL  5  . Insulin Syringe-Needle U-100 (FREESTYLE PRECISION INS SYR) 31G X 5/16" 1 ML MISC 1 Units by Does not apply route as directed.  100 each  5  . lisinopril (PRINIVIL,ZESTRIL) 5 MG tablet Take 0.5 tablets (2.5 mg total) by mouth 2 (two) times daily.  30 tablet  5  . metFORMIN (GLUCOPHAGE) 1000 MG tablet Take 1 tablet (1,000 mg total) by mouth 2 (two) times daily with a meal.  60 tablet  5  . simvastatin (ZOCOR) 20 MG tablet Take 20 mg by mouth at bedtime.      . traMADol (ULTRAM) 50 MG tablet Take 1 tablet (50 mg total) by mouth every 6 (six) hours as needed for pain.  45 tablet  3  . triamcinolone cream (KENALOG) 0.1 % Apply topically 2 (two) times daily.  30 g   0   No current facility-administered medications on file prior to visit.    No Known Allergies  Past Medical History  Diagnosis Date  . Hypertension   . Hyperlipidemia   . DM (diabetes mellitus)   . OSA on CPAP 01/19/2012  . CAD (coronary artery disease) 01/18/12    BMS to RCA    Past Surgical History  Procedure Laterality Date  . No past surgeries    . Coronary stent placement      History  Smoking status  . Current Some Day Smoker -- 0.50 packs/day for 35 years  . Types: Cigarettes  Smokeless tobacco  . Never Used    History  Alcohol Use  . Yes    Comment: 12 beers twice a week.    Family History  Problem Relation Age of Onset  . Heart attack Father 38  . Heart attack Brother   . Stroke Brother   . Cancer Mother     breast  . Hypertension Sister   . Hyperlipidemia Sister   . Hyperlipidemia Sister   . Hypertension Sister     Review of Systems: As noted in history of present illness.  All other systems were reviewed and are negative.  Physical Exam: BP 106/72  Pulse 88  Ht 5' 6.5" (1.689 m)  Wt 274 lb 6.4 oz (124.467 kg)  BMI 43.63 kg/m2  SpO2 97% He is an obese white male in no acute distress.  The skin is warm and dry.   The HEENT exam is unremarkable. The carotids are 2+ without bruits.  There is no thyromegaly.  There is no JVD.  The lungs are clear.  The chest wall is non tender.  The heart exam reveals a regular rate with a normal S1 and S2.  There are no murmurs, gallops, or rubs.  The PMI is not displaced.   Abdominal exam reveals good bowel sounds. It is morbidly obese. There is no hepatosplenomegaly or tenderness.  There are no masses.  Exam of the legs reveal no clubbing, cyanosis, or edema.  The legs are without rashes.  The distal pulses are intact.  Cranial nerves II - XII are intact.  Motor and sensory functions are intact.  The gait is normal. LABORATORY DATA: Transthoracic Echocardiography  Patient: [name], [name] MR #: [id number] Study Date:  06/12/2012 Gender: M Age: 22 Height: 170.2cm Weight: 121.1kg BSA: 2.33m^2 Pt. Status: Room:  ORDERING [place], Peter REFERRING [place], Peter ATTENDING Marca Ancona PERFORMING [facility], [facility] SONOGRAPHER Philomena Course, RDCS cc:  ------------------------------------------------------------ LV EF: 60% - 65%  ------------------------------------------------------------ Indications: Dyspnea 786.09.  ------------------------------------------------------------ History: PMH: Sleep apnea. Acquired from the patient and from the patient's chart. Dyspnea. Coronary artery disease. Risk factors: Current tobacco use. Hypertension. Diabetes mellitus. Dyslipidemia.  ------------------------------------------------------------ Study Conclusions  Left ventricle: The cavity size was normal. Wall thickness was increased in a pattern of mild LVH. Systolic function was normal. The estimated ejection fraction was in the range of 60% to 65%. Wall motion was normal; there were no regional wall motion abnormalities. Left ventricular diastolic function parameters were normal.  ------------------------------------------------------------ Labs, prior tests, procedures, and surgery: Catheterization (May 2013). There was a stenosis which was treated with a stent.  Transthoracic echocardiography. M-mode, complete 2D, spectral Doppler, and color Doppler. Height: Height: 170.2cm. Height: 67in. Weight: Weight: 121.1kg. Weight: 266.4lb. Body mass index: BMI: 41.8kg/m^2. Body surface area: BSA: 2.73m^2. Blood pressure: 132/66. Patient status: Outpatient. Location: [facility]  ------------------------------------------------------------  ------------------------------------------------------------ Left ventricle: The cavity size was normal. Wall thickness was increased in a pattern of mild LVH. Systolic function was normal. The estimated ejection fraction was in the range of 60% to 65%.  Wall motion was normal; there were no regional wall motion abnormalities. The transmitral flow pattern was normal. The deceleration time of the early transmitral flow velocity was normal. The pulmonary vein flow pattern was normal. The tissue Doppler parameters were normal. Left ventricular diastolic function parameters were normal.  ------------------------------------------------------------ Aortic valve: Structurally normal valve. Cusp separation was normal. Doppler: Transvalvular velocity was within the normal range. There was no stenosis. No regurgitation.  ------------------------------------------------------------ Aorta: Aortic root: The aortic root was normal in size. Ascending aorta: The ascending aorta was normal in size.  ------------------------------------------------------------ Mitral valve: Structurally normal valve. Leaflet separation was normal. Doppler: Transvalvular velocity was within the normal range. There was no evidence for stenosis. Trivial regurgitation. Peak gradient: 5mm Hg (D).  ------------------------------------------------------------ Left atrium: The atrium was normal in size.  ------------------------------------------------------------ Right ventricle: The cavity size was normal. Wall thickness was normal. Systolic function was normal.  ------------------------------------------------------------ Pulmonic valve: The valve appears to be grossly normal. Doppler: No significant regurgitation.  ------------------------------------------------------------ Tricuspid valve: Structurally normal valve. Leaflet separation was normal. Doppler: Transvalvular  velocity was within the normal range. Trivial regurgitation.  ------------------------------------------------------------ Right atrium: Poorly visualized. The atrium was normal in size.  ------------------------------------------------------------ Pericardium: The pericardium was normal in  appearance.  ------------------------------------------------------------ Systemic veins: Inferior vena cava: The vessel was normal in size; the respirophasic diameter changes were in the normal range (= 50%); findings are consistent with normal central venous pressure.  ------------------------------------------------------------  2D measurements Normal Doppler measurements Norma Left ventricle l LVID ED, 46.8 mm 43-52 Left ventricle chord, Ea, lat 12. cm/s ----- PLAX ann, tiss 9 LVID ES, 26.8 mm 23-38 DP chord, E/Ea, lat 9.0 ----- PLAX ann, tiss 7 FS, chord, 43 % >29 DP PLAX Ea, med 7.5 cm/s ----- LVPW, ED 12.2 mm ------ ann, tiss IVS/LVPW 0.98 <1.3 DP ratio, ED E/Ea, med 15. ----- Ventricular septum ann, tiss 6 IVS, ED 11.9 mm ------ DP LVOT LVOT Diam, S 20 mm ------ Peak vel, 122 cm/s ----- Area 3.14 cm^2 ------ S Diam 20 mm ------ VTI, S 24. cm ----- Aorta 5 Root diam, 33 mm ------ Peak 6 mm Hg ----- ED gradient, Left atrium S AP dim 40 mm ------ HR 68 bpm ----- AP dim 1.75 cm/m^2 <2.2 Stroke 77 ml ----- index vol Cardiac 5.2 L/min ----- output Cardiac 2.3 L/(min-m ----- index ^2) Stroke 33. ml/m^2 ----- index 6 Mitral valve Peak E 117 cm/s ----- vel Peak A 59. cm/s ----- vel 7 Decelerat 204 ms 150-2 ion time 30 Peak 5 mm Hg ----- gradient, D Peak E/A 2 ----- ratio Systemic veins Estimated 5 mm Hg ----- CVP Right ventricle Sa vel, 14. cm/s ----- lat ann, 7 tiss DP Pulmonic valve Peak vel, 97. cm/s ----- S 7  ------------------------------------------------------------ Prepared and Electronically Authenticated by  Arvilla Meres [id number]   Assessment / Plan: 1. Coronary disease status post bare-metal stent of the RCA in May of 2013. We will continue Effient for one more month and then discontinue. He will remain on aspirin daily. He has no recurrent angina. We will continue with his beta blocker, ACE inhibitor, and statin  therapy.  2. Hypertension, well controlled.   3. Diabetes mellitus, poorly controlled. Patient reports blood sugars are consistently over 200. He needs to follow up with primary care.  4. Hyperlipidemia.  5. Dyspnea. I think this is related to his morbid obesity, ongoing smoking, and sleep apnea. I recommended smoking cessation and weight loss. I recommended regular aerobic exercise. Echocardiogram was unremarkable.  Patient requested a letter for his lawyer to explain his cardiac disability. I explained to the patient that he has no cardiac disability at this time. His echocardiogram was unremarkable and he had stenting of his coronary obstruction. I think his major limitation is from his obesity and ongoing tobacco use.

## 2012-12-17 ENCOUNTER — Encounter: Payer: Self-pay | Admitting: Family Medicine

## 2012-12-17 ENCOUNTER — Ambulatory Visit (INDEPENDENT_AMBULATORY_CARE_PROVIDER_SITE_OTHER): Payer: No Typology Code available for payment source | Admitting: Family Medicine

## 2012-12-17 VITALS — BP 128/76 | HR 76 | Ht 67.0 in | Wt 272.0 lb

## 2012-12-17 DIAGNOSIS — E785 Hyperlipidemia, unspecified: Secondary | ICD-10-CM

## 2012-12-17 DIAGNOSIS — E119 Type 2 diabetes mellitus without complications: Secondary | ICD-10-CM

## 2012-12-17 DIAGNOSIS — I10 Essential (primary) hypertension: Secondary | ICD-10-CM

## 2012-12-17 DIAGNOSIS — R21 Rash and other nonspecific skin eruption: Secondary | ICD-10-CM

## 2012-12-17 MED ORDER — TRIAMCINOLONE ACETONIDE 0.1 % EX CREA: TOPICAL_CREAM | Freq: Two times a day (BID) | CUTANEOUS | Status: DC

## 2012-12-17 MED ORDER — INSULIN ASPART 100 UNIT/ML ~~LOC~~ SOLN: 8.0000 [IU] | Freq: Three times a day (TID) | SUBCUTANEOUS | Status: DC

## 2012-12-17 NOTE — Assessment & Plan Note (Signed)
Essentially at goal.

## 2012-12-17 NOTE — Progress Notes (Signed)
  Subjective:    Patient ID: [name] [name], male    DOB: 1961-03-04, 52 y.o.   MRN: [id number]  HPI  Doing well.  Still in school.  He is having dental extractions in may.  Advised about stopping the Effient and ASA prior to procedure.  Still trying to get disability.  Needs a letter for lawyer.  He is needing a Novolog rx.  He can only take it twice daily as he is not comfortable giving himself insulin.  Review of Systems  Constitutional: Negative for fever and chills.  Respiratory: Negative for cough and shortness of breath.   Gastrointestinal: Negative for abdominal pain.  Genitourinary: Negative for dysuria.  Musculoskeletal: Negative for arthralgias.       Objective:   Physical Exam  Vitals reviewed. Constitutional: He appears well-developed and well-nourished.  HENT:  Head: Normocephalic and atraumatic.  Eyes: No scleral icterus.  Neck: Neck supple.  Cardiovascular: Normal rate and regular rhythm.   Pulmonary/Chest: Effort normal and breath sounds normal. He has no wheezes.  Abdominal: Soft. There is no tenderness.          Assessment & Plan:

## 2012-12-17 NOTE — Assessment & Plan Note (Signed)
Essentially at goal. 

## 2012-12-17 NOTE — Patient Instructions (Signed)
Smoking Cessation Quitting smoking is important to your health and has many advantages. However, it is not always easy to quit since nicotine is a very addictive drug. Often times, people try 3 times or more before being able to quit. This document explains the best ways for you to prepare to quit smoking. Quitting takes hard work and a lot of effort, but you can do it. ADVANTAGES OF QUITTING SMOKING  You will live longer, feel better, and live better.  Your body will feel the impact of quitting smoking almost immediately.  Within 20 minutes, blood pressure decreases. Your pulse returns to its normal level.  After 8 hours, carbon monoxide levels in the blood return to normal. Your oxygen level increases.  After 24 hours, the chance of having a heart attack starts to decrease. Your breath, hair, and body stop smelling like smoke.  After 48 hours, damaged nerve endings begin to recover. Your sense of taste and smell improve.  After 72 hours, the body is virtually free of nicotine. Your bronchial tubes relax and breathing becomes easier.  After 2 to 12 weeks, lungs can hold more air. Exercise becomes easier and circulation improves.  The risk of having a heart attack, stroke, cancer, or lung disease is greatly reduced.  After 1 year, the risk of coronary heart disease is cut in half.  After 5 years, the risk of stroke falls to the same as a nonsmoker.  After 10 years, the risk of lung cancer is cut in half and the risk of other cancers decreases significantly.  After 15 years, the risk of coronary heart disease drops, usually to the level of a nonsmoker.  If you are pregnant, quitting smoking will improve your chances of having a healthy baby.  The people you live with, especially any children, will be healthier.  You will have extra money to spend on things other than cigarettes. QUESTIONS TO THINK ABOUT BEFORE ATTEMPTING TO QUIT You may want to talk about your answers with your  caregiver.  Why do you want to quit?  If you tried to quit in the past, what helped and what did not?  What will be the most difficult situations for you after you quit? How will you plan to handle them?  Who can help you through the tough times? Your family? Friends? A caregiver?  What pleasures do you get from smoking? What ways can you still get pleasure if you quit? Here are some questions to ask your caregiver:  How can you help me to be successful at quitting?  What medicine do you think would be best for me and how should I take it?  What should I do if I need more help?  What is smoking withdrawal like? How can I get information on withdrawal? GET READY  Set a quit date.  Change your environment by getting rid of all cigarettes, ashtrays, matches, and lighters in your home, car, or work. Do not let people smoke in your home.  Review your past attempts to quit. Think about what worked and what did not. GET SUPPORT AND ENCOURAGEMENT You have a better chance of being successful if you have help. You can get support in many ways.  Tell your family, friends, and co-workers that you are going to quit and need their support. Ask them not to smoke around you.  Get individual, group, or telephone counseling and support. Programs are available at local hospitals and health centers. Call your local health department for   information about programs in your area.  Spiritual beliefs and practices may help some smokers quit.  Download a "quit meter" on your computer to keep track of quit statistics, such as how long you have gone without smoking, cigarettes not smoked, and money saved.  Get a self-help book about quitting smoking and staying off of tobacco. LEARN NEW SKILLS AND BEHAVIORS  Distract yourself from urges to smoke. Talk to someone, go for a walk, or occupy your time with a task.  Change your normal routine. Take a different route to work. Drink tea instead of coffee.  Eat breakfast in a different place.  Reduce your stress. Take a hot bath, exercise, or read a book.  Plan something enjoyable to do every day. Reward yourself for not smoking.  Explore interactive web-based programs that specialize in helping you quit. GET MEDICINE AND USE IT CORRECTLY Medicines can help you stop smoking and decrease the urge to smoke. Combining medicine with the above behavioral methods and support can greatly increase your chances of successfully quitting smoking.  Nicotine replacement therapy helps deliver nicotine to your body without the negative effects and risks of smoking. Nicotine replacement therapy includes nicotine gum, lozenges, inhalers, nasal sprays, and skin patches. Some may be available over-the-counter and others require a prescription.  Antidepressant medicine helps people abstain from smoking, but how this works is unknown. This medicine is available by prescription.  Nicotinic receptor partial agonist medicine simulates the effect of nicotine in your brain. This medicine is available by prescription. Ask your caregiver for advice about which medicines to use and how to use them based on your health history. Your caregiver will tell you what side effects to look out for if you choose to be on a medicine or therapy. Carefully read the information on the package. Do not use any other product containing nicotine while using a nicotine replacement product.  RELAPSE OR DIFFICULT SITUATIONS Most relapses occur within the first 3 months after quitting. Do not be discouraged if you start smoking again. Remember, most people try several times before finally quitting. You may have symptoms of withdrawal because your body is used to nicotine. You may crave cigarettes, be irritable, feel very hungry, cough often, get headaches, or have difficulty concentrating. The withdrawal symptoms are only temporary. They are strongest when you first quit, but they will go away within  10 14 days. To reduce the chances of relapse, try to:  Avoid drinking alcohol. Drinking lowers your chances of successfully quitting.  Reduce the amount of caffeine you consume. Once you quit smoking, the amount of caffeine in your body increases and can give you symptoms, such as a rapid heartbeat, sweating, and anxiety.  Avoid smokers because they can make you want to smoke.  Do not let weight gain distract you. Many smokers will gain weight when they quit, usually less than 10 pounds. Eat a healthy diet and stay active. You can always lose the weight gained after you quit.  Find ways to improve your mood other than smoking. FOR MORE INFORMATION  www.smokefree.gov  Document Released: 08/23/2001 Document Revised: 02/28/2012 Document Reviewed: 12/08/2011 [facility] Patient Information 2013 [place], [place]. Diabetes and Foot Care Diabetes may cause you to have a poor blood supply (circulation) to your legs and feet. Because of this, the skin may be thinner, break easier, and heal more slowly. You also may have nerve damage in your legs and feet causing decreased feeling. You may not notice minor injuries to your feet that  could lead to serious problems or infections. Taking care of your feet is one of the most important things you can do for yourself.  HOME CARE INSTRUCTIONS  Do not go barefoot. Bare feet are easily injured.  Check your feet daily for blisters, cuts, and redness.  Wash your feet with warm water (not hot) and mild soap. Pat your feet and between your toes until completely dry.  Apply a moisturizing lotion that does not contain alcohol or petroleum jelly to the dry skin on your feet and to dry brittle toenails. Do not put it between your toes.  Trim your toenails straight across. Do not dig under them or around the cuticle.  Do not cut corns or calluses, or try to remove them with medicine.  Wear clean cotton socks or stockings every day. Make sure they are not too tight.  Do not wear knee high stockings since they may decrease blood flow to your legs.  Wear leather shoes that fit properly and have enough cushioning. To break in new shoes, wear them just a few hours a day to avoid injuring your feet.  Wear shoes at all times, even in the house.  Do not cross your legs. This may decrease the blood flow to your feet.  If you find a minor scrape, cut, or break in the skin on your feet, keep it and the skin around it clean and dry. These areas may be cleansed with mild soap and water. Do not use peroxide, alcohol, iodine or Merthiolate.  When you remove an adhesive bandage, be sure not to harm the skin around it.  If you have a wound, look at it several times a day to make sure it is healing.  Do not use heating pads or hot water bottles. Burns can occur. If you have lost feeling in your feet or legs, you may not know it is happening until it is too late.  Report any cuts, sores or bruises to your caregiver. Do not wait! SEEK MEDICAL CARE IF:   You have an injury that is not healing or you notice redness, numbness, burning, or tingling.  Your feet always feel cold.  You have pain or cramps in your legs and feet. SEEK IMMEDIATE MEDICAL CARE IF:   There is increasing redness, swelling, or increasing pain in the wound.  There is a red line that goes up your leg.  Pus is coming from a wound.  You develop an unexplained oral temperature above 102 F (38.9 C), or as your caregiver suggests.  You notice a bad smell coming from an ulcer or wound. MAKE SURE YOU:   Understand these instructions.  Will watch your condition.  Will get help right away if you are not doing well or get worse. Document Released: 08/26/2000 Document Revised: 11/21/2011 Document Reviewed: 03/04/2009 [facility] Patient Information 2013 [place], [place]. Diabetes and Periodontal Disease Periodontal disease means disease around a tooth. It is usually a long-standing infection that  affects the gums and bones supporting the teeth. Those with poorly controlled diabetes are more likely to have periodontal disease, and infection makes diabetes harder to control. Periodontal (gum) diseases, if left untreated, can lead to tooth loss. Periodontal disease can affect one tooth or many teeth. It begins when the bacteria in plaque, which builds up on your teeth, causes the gums to become inflamed.  Gingivitis is a mild form of the disease in which the gums redden, swell and bleed easily. There is usually little or no  discomfort. Gingivitis is often caused by poor tooth, gum and mouth cleaning (oral hygiene). Gingivitis is reversible with professional treatment and good oral hygiene.  CAUSES The main cause of periodontal disease is tooth plaque. This is what is removed from your teeth when they are cleaned. Other factors affecting the health of your gums are:   Diabetes. It makes it harder to fight infections.  Smoking and tobacco.  Genetics. This means you may have inherited this from your parents.  Hormonal changes of puberty and pregnancy.  Stress.  Clenching or grinding your teeth.  Poor nutrition.  Diseases that interfere with the body's protection system (immune system). TREATMENT  Good oral hygiene is the best treatment for periodontal disease.  Floss and brush at least daily.  Good blood glucose (sugar) control.  See your dentist regularly, at least 2 times per year.  Stop smoking.  If regular and improved hygiene does not improve the condition, you may require a referral to a specialist.  Sometimes, surgery becomes a necessary part of treatment. SEEK MEDICAL CARE IF:   Your gums are red, swollen or bleed easily.  You are having problems keeping your blood glucose in your goal range.  You develop a fever of more than 100.5 F (38.1 C). Document Released: 09/01/2003 Document Revised: 11/21/2011 Document Reviewed: 02/03/2009 [facility] Patient Information  2013 [place], [place].

## 2012-12-17 NOTE — Assessment & Plan Note (Signed)
Addition of Novolog, continue to work on diet.

## 2012-12-17 NOTE — Assessment & Plan Note (Signed)
Trial of Kenalog

## 2012-12-24 ENCOUNTER — Telehealth: Payer: Self-pay | Admitting: Family Medicine

## 2012-12-24 NOTE — Telephone Encounter (Signed)
Pt states that he wants to "get an appointment with the head doctor."  Advised that we have a psychologist here.  Pt is very interested in this, gave pt Dr. Pascal Lux number.  He will contact to make appt. Fleeger, Maryjo Rochester

## 2012-12-24 NOTE — Telephone Encounter (Signed)
Patient is calling [name] [name] back an appt with a head doctor.

## 2012-12-25 ENCOUNTER — Telehealth: Payer: Self-pay | Admitting: Psychology

## 2012-12-25 NOTE — Telephone Encounter (Signed)
Patient left VM yesterday requesting an appointment.  Called him back today to discuss.  Left a VM.
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
## 2012-12-28 NOTE — Telephone Encounter (Signed)
I'm not familiar with any agency who will do this service without insurance. I would encourage him to still reach out to [facility] and see what services they can provide or resources they may be aware of.

## 2013-01-16 ENCOUNTER — Telehealth: Payer: Self-pay | Admitting: Family Medicine

## 2013-01-16 ENCOUNTER — Telehealth: Payer: Self-pay | Admitting: Psychology

## 2013-01-16 NOTE — Telephone Encounter (Signed)
Touched base with our Child psychotherapist who had the following to say about Kellis's request for a "learning disability" evaluation:  I'm not familiar with any agency who will do this service without insurance. I would encourage him to still reach out to [facility] and see what services they can provide or resources they may be aware of.  Phone number for [facility] is:  [phone].   I checked with neuropsychologist, Eula Flax to see if he might be a resource and he had this to say: I do not perform learning disability evaluations. I would suggest Burgess Amor [phone]), Legrand Rams [phone]) or Lynetta Mare [phone]).  but I don't know whether they will see him without insurance or even if he has Medicaid.  Called Mikyle to give him this information.  Left a VM.  I will tell him this when he calls back.  Will also let PCP know.

## 2013-01-16 NOTE — Telephone Encounter (Signed)
Called and spoke with patient.  He did not mention the "Head Doctor" but did mention the needles for the monitor, but he "didn't know which kind was needed because I have two machines"  Attempted to explain to patient that lancets did not have to a a specific brand to match the monitor.  Pt was very confused about this.  As I was unable to get clarification from pt what he needed, he wanted to come up here and show the machine to me.  Advised I would make a nurse visit and he could bring both machines and we would sort this out. Fleeger, Maryjo Rochester

## 2013-01-16 NOTE — Telephone Encounter (Signed)
Patient is calling to speak to Shanda Bumps about "the head doctor" and he needs more needles for his Blood Sugar monitor.

## 2013-01-16 NOTE — Telephone Encounter (Signed)
[name] [name] returned my VM from earlier.  I was going to give him the number for [facility] but he didn't have pen and paper available.  He said he was coming here tomorrow at 2:30.  I asked him to ask for Shanda Bumps or to ask the nurse he was meeting with to print off the information for [facility].

## 2013-01-17 ENCOUNTER — Telehealth: Payer: Self-pay | Admitting: *Deleted

## 2013-01-17 ENCOUNTER — Ambulatory Visit (INDEPENDENT_AMBULATORY_CARE_PROVIDER_SITE_OTHER): Payer: No Typology Code available for payment source | Admitting: *Deleted

## 2013-01-17 DIAGNOSIS — E119 Type 2 diabetes mellitus without complications: Secondary | ICD-10-CM

## 2013-01-17 MED ORDER — ACCU-CHEK FASTCLIX LANCETS MISC: 1.0000 [IU] | Freq: Four times a day (QID) | Status: DC

## 2013-01-17 NOTE — Telephone Encounter (Signed)
Pt needs prescription for lancets to be faxed to the [facility]. Information given for behavioral health services as requested by pt. Wyatt Haste, RN-BSN

## 2013-01-17 NOTE — Telephone Encounter (Signed)
Faxed. Abrahim Sargent Dawn  

## 2013-01-17 NOTE — Progress Notes (Signed)
Pt educated on how to use lancets and which meter to use. Verbalized understanding and return demonstration. Given info as requested for behavioral health. No further needs at this time. Wyatt Haste, RN-BSN

## 2013-02-20 ENCOUNTER — Ambulatory Visit (INDEPENDENT_AMBULATORY_CARE_PROVIDER_SITE_OTHER): Payer: No Typology Code available for payment source | Admitting: Family Medicine

## 2013-02-20 ENCOUNTER — Encounter: Payer: Self-pay | Admitting: Family Medicine

## 2013-02-20 VITALS — BP 137/72 | HR 63 | Temp 98.0°F | Ht 67.0 in | Wt 270.0 lb

## 2013-02-20 DIAGNOSIS — Z720 Tobacco use: Secondary | ICD-10-CM

## 2013-02-20 DIAGNOSIS — E119 Type 2 diabetes mellitus without complications: Secondary | ICD-10-CM

## 2013-02-20 DIAGNOSIS — I10 Essential (primary) hypertension: Secondary | ICD-10-CM

## 2013-02-20 DIAGNOSIS — K029 Dental caries, unspecified: Secondary | ICD-10-CM

## 2013-02-20 DIAGNOSIS — F17200 Nicotine dependence, unspecified, uncomplicated: Secondary | ICD-10-CM

## 2013-02-20 DIAGNOSIS — M549 Dorsalgia, unspecified: Secondary | ICD-10-CM

## 2013-02-20 NOTE — Assessment & Plan Note (Signed)
Getting teeth pulled.

## 2013-02-20 NOTE — Addendum Note (Signed)
Addended by: Oneal Grout on: 02/20/2013 12:13 PM   Modules accepted: Orders

## 2013-02-20 NOTE — Assessment & Plan Note (Signed)
Advised on diet, exercise, bringing BS log/meter to appointments.  Advised Novolog with all meals and Lantus.  He will work on giving himself his insulin under the watch of the roommate, who dispenses his insulin now.

## 2013-02-20 NOTE — Assessment & Plan Note (Signed)
Excellent control.   

## 2013-02-20 NOTE — Progress Notes (Signed)
  Subjective:    Patient ID: [name] [name], male    DOB: [birth year], 52 y.o.   MRN: [id number]  HPI  Returns to day for f/u.   1.  DM-still only taking Novolog in the am and Lantus at hs.  HgbA1C is 9.6 today, previously 9.8. Needs retinal scan.  2.  Seen by [facility] and is seeing them twice more.  They stared him on anti-depressants, however, he has not filled this medication.  3.  Reports getting teeth pulled next week.  4.  Back pain continues to be an issue.  Taking pain medication.  Seems to be worse with stopping after walking.  Reports lower extremity weakness at that time.  Review of Systems  Constitutional: Negative for fever and chills.  Respiratory: Negative for chest tightness and shortness of breath.   Cardiovascular: Negative for chest pain.  Gastrointestinal: Negative for abdominal pain.  Genitourinary: Negative for dysuria.       Objective:   Physical Exam  Vitals reviewed. Constitutional: He is oriented to person, place, and time. He appears well-developed and well-nourished.  HENT:  Head: Normocephalic and atraumatic.  Neck: Neck supple.  Cardiovascular: Normal rate and regular rhythm.   No murmur heard. Pulmonary/Chest: Effort normal and breath sounds normal. No respiratory distress.  Abdominal: Soft. There is no tenderness.  Musculoskeletal: Normal range of motion. He exhibits no edema and no tenderness.  Neurological: He is alert and oriented to person, place, and time.  Skin: Skin is warm. No rash noted.          Assessment & Plan:

## 2013-02-20 NOTE — Assessment & Plan Note (Signed)
Continue to encourage cessation-handout given.

## 2013-02-20 NOTE — Patient Instructions (Addendum)
Start Insulin Novolog 8 units with meals three times a day with each meal.  Please work on trying to give yourself your insulin. Continue to take your Lantus at bedtime. We need to schedule you for an eye exam. Diabetes and Exercise Regular exercise is important and can help:   Control blood glucose (sugar).  Decrease blood pressure.    Control blood lipids (cholesterol, triglycerides).  Improve overall health. BENEFITS FROM EXERCISE  Improved fitness.  Improved flexibility.  Improved endurance.  Increased bone density.  Weight control.  Increased muscle strength.  Decreased body fat.  Improvement of the body's use of insulin, a hormone.  Increased insulin sensitivity.  Reduction of insulin needs.  Reduced stress and tension.  Helps you feel better. People with diabetes who add exercise to their lifestyle gain additional benefits, including:  Weight loss.  Reduced appetite.  Improvement of the body's use of blood glucose.  Decreased risk factors for heart disease:  Lowering of cholesterol and triglycerides.  Raising the level of good cholesterol (high-density lipoproteins, HDL).  Lowering blood sugar.  Decreased blood pressure. TYPE 1 DIABETES AND EXERCISE  Exercise will usually lower your blood glucose.  If blood glucose is greater than 240 mg/dl, check urine ketones. If ketones are present, do not exercise.  Location of the insulin injection sites may need to be adjusted with exercise. Avoid injecting insulin into areas of the body that will be exercised. For example, avoid injecting insulin into:  The arms when playing tennis.  The legs when jogging. For more information, discuss this with your caregiver.  Keep a record of:  Food intake.  Type and amount of exercise.  Expected peak times of insulin action.  Blood glucose levels. Do this before, during, and after exercise. Review your records with your caregiver. This will help you to  develop guidelines for adjusting food intake and insulin amounts.  TYPE 2 DIABETES AND EXERCISE  Regular physical activity can help control blood glucose.  Exercise is important because it may:  Increase the body's sensitivity to insulin.  Improve blood glucose control.  Exercise reduces the risk of heart disease. It decreases serum cholesterol and triglycerides. It also lowers blood pressure.  Those who take insulin or oral hypoglycemic agents should watch for signs of hypoglycemia. These signs include dizziness, shaking, sweating, chills, and confusion.  Body water is lost during exercise. It must be replaced. This will help to avoid loss of body fluids (dehydration) or heat stroke. Be sure to talk to your caregiver before starting an exercise program to make sure it is safe for you. Remember, any activity is better than none.  Document Released: 11/19/2003 Document Revised: 11/21/2011 Document Reviewed: 03/05/2009 [facility] Patient Information 2014 [place], [place]. Smoking Cessation Quitting smoking is important to your health and has many advantages. However, it is not always easy to quit since nicotine is a very addictive drug. Often times, people try 3 times or more before being able to quit. This document explains the best ways for you to prepare to quit smoking. Quitting takes hard work and a lot of effort, but you can do it. ADVANTAGES OF QUITTING SMOKING  You will live longer, feel better, and live better.  Your body will feel the impact of quitting smoking almost immediately.  Within 20 minutes, blood pressure decreases. Your pulse returns to its normal level.  After 8 hours, carbon monoxide levels in the blood return to normal. Your oxygen level increases.  After 24 hours, the chance of having  a heart attack starts to decrease. Your breath, hair, and body stop smelling like smoke.  After 48 hours, damaged nerve endings begin to recover. Your sense of taste and smell  improve.  After 72 hours, the body is virtually free of nicotine. Your bronchial tubes relax and breathing becomes easier.  After 2 to 12 weeks, lungs can hold more air. Exercise becomes easier and circulation improves.  The risk of having a heart attack, stroke, cancer, or lung disease is greatly reduced.  After 1 year, the risk of coronary heart disease is cut in half.  After 5 years, the risk of stroke falls to the same as a nonsmoker.  After 10 years, the risk of lung cancer is cut in half and the risk of other cancers decreases significantly.  After 15 years, the risk of coronary heart disease drops, usually to the level of a nonsmoker.  If you are pregnant, quitting smoking will improve your chances of having a healthy baby.  The people you live with, especially any children, will be healthier.  You will have extra money to spend on things other than cigarettes. QUESTIONS TO THINK ABOUT BEFORE ATTEMPTING TO QUIT You may want to talk about your answers with your caregiver.  Why do you want to quit?  If you tried to quit in the past, what helped and what did not?  What will be the most difficult situations for you after you quit? How will you plan to handle them?  Who can help you through the tough times? Your family? Friends? A caregiver?  What pleasures do you get from smoking? What ways can you still get pleasure if you quit? Here are some questions to ask your caregiver:  How can you help me to be successful at quitting?  What medicine do you think would be best for me and how should I take it?  What should I do if I need more help?  What is smoking withdrawal like? How can I get information on withdrawal? GET READY  Set a quit date.  Change your environment by getting rid of all cigarettes, ashtrays, matches, and lighters in your home, car, or work. Do not let people smoke in your home.  Review your past attempts to quit. Think about what worked and what did  not. GET SUPPORT AND ENCOURAGEMENT You have a better chance of being successful if you have help. You can get support in many ways.  Tell your family, friends, and co-workers that you are going to quit and need their support. Ask them not to smoke around you.  Get individual, group, or telephone counseling and support. Programs are available at [employer] and health centers. Call your local health department for information about programs in your area.  Spiritual beliefs and practices may help some smokers quit.  Download a "quit meter" on your computer to keep track of quit statistics, such as how long you have gone without smoking, cigarettes not smoked, and money saved.  Get a self-help book about quitting smoking and staying off of tobacco. LEARN NEW SKILLS AND BEHAVIORS  Distract yourself from urges to smoke. Talk to someone, go for a walk, or occupy your time with a task.  Change your normal routine. Take a different route to work. Drink tea instead of coffee. Eat breakfast in a different place.  Reduce your stress. Take a hot bath, exercise, or read a book.  Plan something enjoyable to do every day. Reward yourself for not smoking.  Explore interactive web-based programs that specialize in helping you quit. GET MEDICINE AND USE IT CORRECTLY Medicines can help you stop smoking and decrease the urge to smoke. Combining medicine with the above behavioral methods and support can greatly increase your chances of successfully quitting smoking.  Nicotine replacement therapy helps deliver nicotine to your body without the negative effects and risks of smoking. Nicotine replacement therapy includes nicotine gum, lozenges, inhalers, nasal sprays, and skin patches. Some may be available over-the-counter and others require a prescription.  Antidepressant medicine helps people abstain from smoking, but how this works is unknown. This medicine is available by prescription.  Nicotinic  receptor partial agonist medicine simulates the effect of nicotine in your brain. This medicine is available by prescription. Ask your caregiver for advice about which medicines to use and how to use them based on your health history. Your caregiver will tell you what side effects to look out for if you choose to be on a medicine or therapy. Carefully read the information on the package. Do not use any other product containing nicotine while using a nicotine replacement product.  RELAPSE OR DIFFICULT SITUATIONS Most relapses occur within the first 3 months after quitting. Do not be discouraged if you start smoking again. Remember, most people try several times before finally quitting. You may have symptoms of withdrawal because your body is used to nicotine. You may crave cigarettes, be irritable, feel very hungry, cough often, get headaches, or have difficulty concentrating. The withdrawal symptoms are only temporary. They are strongest when you first quit, but they will go away within 10 14 days. To reduce the chances of relapse, try to:  Avoid drinking alcohol. Drinking lowers your chances of successfully quitting.  Reduce the amount of caffeine you consume. Once you quit smoking, the amount of caffeine in your body increases and can give you symptoms, such as a rapid heartbeat, sweating, and anxiety.  Avoid smokers because they can make you want to smoke.  Do not let weight gain distract you. Many smokers will gain weight when they quit, usually less than 10 pounds. Eat a healthy diet and stay active. You can always lose the weight gained after you quit.  Find ways to improve your mood other than smoking. FOR MORE INFORMATION  www.smokefree.gov  Document Released: 08/23/2001 Document Revised: 02/28/2012 Document Reviewed: 12/08/2011 [facility] Patient Information 2014 [place], [place].

## 2013-02-20 NOTE — Assessment & Plan Note (Signed)
Ongoing issue--weight is not helping.  No insurance limits treatment options.

## 2013-04-15 ENCOUNTER — Emergency Department (HOSPITAL_COMMUNITY): Payer: No Typology Code available for payment source

## 2013-04-15 ENCOUNTER — Emergency Department (HOSPITAL_COMMUNITY)
Admission: EM | Admit: 2013-04-15 | Discharge: 2013-04-15 | Disposition: A | Discharge status: Home / Self Care | Payer: No Typology Code available for payment source | Attending: Emergency Medicine | Admitting: Emergency Medicine

## 2013-04-15 ENCOUNTER — Encounter (HOSPITAL_COMMUNITY): Payer: Self-pay | Admitting: *Deleted

## 2013-04-15 DIAGNOSIS — G4733 Obstructive sleep apnea (adult) (pediatric): Secondary | ICD-10-CM | POA: Insufficient documentation

## 2013-04-15 DIAGNOSIS — Z794 Long term (current) use of insulin: Secondary | ICD-10-CM | POA: Insufficient documentation

## 2013-04-15 DIAGNOSIS — I10 Essential (primary) hypertension: Secondary | ICD-10-CM | POA: Insufficient documentation

## 2013-04-15 DIAGNOSIS — Z9861 Coronary angioplasty status: Secondary | ICD-10-CM | POA: Insufficient documentation

## 2013-04-15 DIAGNOSIS — R079 Chest pain, unspecified: Secondary | ICD-10-CM | POA: Insufficient documentation

## 2013-04-15 DIAGNOSIS — E785 Hyperlipidemia, unspecified: Secondary | ICD-10-CM | POA: Insufficient documentation

## 2013-04-15 DIAGNOSIS — F17200 Nicotine dependence, unspecified, uncomplicated: Secondary | ICD-10-CM | POA: Insufficient documentation

## 2013-04-15 DIAGNOSIS — Z7982 Long term (current) use of aspirin: Secondary | ICD-10-CM | POA: Insufficient documentation

## 2013-04-15 DIAGNOSIS — Z79899 Other long term (current) drug therapy: Secondary | ICD-10-CM | POA: Insufficient documentation

## 2013-04-15 DIAGNOSIS — Z9889 Other specified postprocedural states: Secondary | ICD-10-CM | POA: Insufficient documentation

## 2013-04-15 DIAGNOSIS — I2510 Atherosclerotic heart disease of native coronary artery without angina pectoris: Secondary | ICD-10-CM | POA: Insufficient documentation

## 2013-04-15 DIAGNOSIS — E119 Type 2 diabetes mellitus without complications: Secondary | ICD-10-CM | POA: Insufficient documentation

## 2013-04-15 LAB — BASIC METABOLIC PANEL
Calcium: 9.7 mg/dL (ref 8.4–10.5)
Creatinine, Ser: 0.63 mg/dL (ref 0.50–1.35)
GFR calc Af Amer: >90 mL/min (ref >90)

## 2013-04-15 LAB — CBC
MCH: 30.5 pg (ref 26.0–34.0)
MCV: 87.9 fL (ref 78.0–100.0)
Platelets: 284 10*3/uL (ref 150–400)
RDW: 12.3 % (ref 11.5–15.5)
WBC: 11.1 10*3/uL — ABNORMAL HIGH (ref 4.0–10.5)

## 2013-04-15 LAB — POCT I-STAT TROPONIN I: Troponin i, poc: 0.02 ng/mL (ref 0.00–0.08)

## 2013-04-15 NOTE — ED Notes (Addendum)
Pt reports intermittent chest pain for a few weeks. Pt had cardiac stent place may 2013. At present denies chest pain, but when pain is present it feels like prick 10/10. Pt reports some SOB, speaking in full sentences. Hx of diabetes

## 2013-04-15 NOTE — ED Provider Notes (Signed)
CSN: [id number]     Arrival date & time 04/15/13  1120 History     First MD Initiated Contact with Patient 04/15/13 1142     Chief Complaint  Patient presents with  . sent from pcp for previous chest pain    (Consider location/radiation/quality/duration/timing/severity/associated sxs/prior Treatment) The history is provided by the patient.  pt with hx cad, presents indicating had cp for past week.  Episodes are brief, several seconds duration. At times feels like a prick.  Pt cant recall cp he had prior to stent a year ago, but states he thinks that was different. Pt states his behavioral health doctor told him to get checked in ED.  Pt symptoms at rest. Denies any relation to activity or exertion. No associated sob, nv or diaphoresis.  No pleuritic pain. Denies cough or uri c/o. No fever or chills. No leg pain or swelling. No unusual fatigue or doe.     Past Medical History  Diagnosis Date  . Hypertension   . Hyperlipidemia   . DM (diabetes mellitus)   . OSA on CPAP 01/19/2012  . CAD (coronary artery disease) 01/18/12    BMS to RCA   Past Surgical History  Procedure Laterality Date  . No past surgeries    . Coronary stent placement     Family History  Problem Relation Age of Onset  . Heart attack Father 38  . Heart attack Brother   . Stroke Brother   . Cancer Mother     breast  . Hypertension Sister   . Hyperlipidemia Sister   . Hyperlipidemia Sister   . Hypertension Sister    History  Substance Use Topics  . Smoking status: Current Some Day Smoker -- 0.50 packs/day for 35 years    Types: Cigarettes  . Smokeless tobacco: Never Used  . Alcohol Use: Yes     Comment: 12 beers twice a week.    Review of Systems  Constitutional: Negative for fever.  HENT: Negative for neck pain.   Eyes: Negative for redness.  Respiratory: Negative for cough and shortness of breath.   Cardiovascular: Positive for chest pain. Negative for palpitations and leg swelling.  Gastrointestinal:  Negative for abdominal pain.  Genitourinary: Negative for flank pain.  Musculoskeletal: Negative for back pain.  Skin: Negative for rash.  Neurological: Negative for headaches.  Hematological: Does not bruise/bleed easily.  Psychiatric/Behavioral: Negative for confusion.    Allergies  Shellfish allergy  Home Medications   Current Outpatient Rx  Name  Route  Sig  Dispense  Refill  . aspirin 81 MG chewable tablet   Oral   Chew 81 mg by mouth daily.         Marland Kitchen atorvastatin (LIPITOR) 40 MG tablet   Oral   Take 40 mg by mouth daily.         . metFORMIN (GLUCOPHAGE) 1000 MG tablet   Oral   Take 1,000 mg by mouth 2 (two) times daily with a meal.         . ACCU-CHEK FASTCLIX LANCETS MISC   Percutaneous   1 Units by Percutaneous route 4 (four) times daily.   100 each   12     Dx: 648.83   . atenolol (TENORMIN) 25 MG tablet   Oral   Take 1 tablet (25 mg total) by mouth daily.   30 tablet   5   . glipiZIDE (GLUCOTROL) 10 MG tablet   Oral   Take 1 tablet (10 mg total) by  mouth daily.   30 tablet   5   . insulin aspart (NOVOLOG) 100 UNIT/ML injection   Subcutaneous   Inject 8 Units into the skin 3 (three) times daily before meals.   1 vial   12   . insulin glargine (LANTUS) 100 UNIT/ML injection   Subcutaneous   Inject 30 Units into the skin 2 (two) times daily.   10 mL   5   . Insulin Syringe-Needle U-100 (FREESTYLE PRECISION INS SYR) 31G X 5/16" 1 ML MISC   Does not apply   1 Units by Does not apply route as directed.   100 each   5   . lisinopril (PRINIVIL,ZESTRIL) 5 MG tablet   Oral   Take 0.5 tablets (2.5 mg total) by mouth 2 (two) times daily.   30 tablet   5   . triamcinolone cream (KENALOG) 0.1 %   Topical   Apply topically 2 (two) times daily.   30 g   0    BP 122/68  Pulse 76  Temp(Src) 98.3 F (36.8 C) (Oral)  Resp 15  SpO2 97% Physical Exam  Nursing note and vitals reviewed. Constitutional: He is oriented to person, place, and  time. He appears well-developed and well-nourished. No distress.  HENT:  Mouth/Throat: Oropharynx is clear and moist.  Eyes: Conjunctivae are normal. No scleral icterus.  Neck: Neck supple. No tracheal deviation present.  Cardiovascular: Normal rate, regular rhythm, normal heart sounds and intact distal pulses.  Exam reveals no gallop and no friction rub.   No murmur heard. Pulmonary/Chest: Effort normal and breath sounds normal. No accessory muscle usage. No respiratory distress. He exhibits no tenderness.  Abdominal: Soft. He exhibits no distension. There is no tenderness.  Musculoskeletal: Normal range of motion. He exhibits no edema and no tenderness.  Neurological: He is alert and oriented to person, place, and time.  Skin: Skin is warm and dry.  Psychiatric: He has a normal mood and affect.    ED Course   Procedures (including critical care time)    Results for orders placed during the hospital encounter of 04/15/13  CBC      Result Value Range   WBC 11.1 (*) 4.0 - 10.5 K/uL   RBC 4.95  4.22 - 5.81 MIL/uL   Hemoglobin 15.1  13.0 - 17.0 g/dL   HCT 30.8  65.7 - 84.6 %   MCV 87.9  78.0 - 100.0 fL   MCH 30.5  26.0 - 34.0 pg   MCHC 34.7  30.0 - 36.0 g/dL   RDW 96.2  95.2 - 84.1 %   Platelets 284  150 - 400 K/uL  BASIC METABOLIC PANEL      Result Value Range   Sodium 135  135 - 145 mEq/L   Potassium 4.4  3.5 - 5.1 mEq/L   Chloride 100  96 - 112 mEq/L   CO2 25  19 - 32 mEq/L   Glucose, Bld 250 (*) 70 - 99 mg/dL   BUN 15  6 - 23 mg/dL   Creatinine, Ser 3.24  0.50 - 1.35 mg/dL   Calcium 9.7  8.4 - 40.1 mg/dL   GFR calc non Af Amer >90  >90 mL/min   GFR calc Af Amer >90  >90 mL/min  POCT I-STAT TROPONIN I      Result Value Range   Troponin i, poc 0.02  0.00 - 0.08 ng/mL   Comment 3  Dg Chest 2 View  04/15/2013   *RADIOLOGY REPORT*  Clinical Data: Intermittent chest pain  CHEST - 2 VIEW  Comparison: 09/29/2012  Findings: Cardiomediastinal silhouette is stable.   No acute infiltrate or pleural effusion.  No pulmonary edema.  Stable probable chronic mild interstitial prominence.  IMPRESSION: No acute infiltrate or pulmonary edema.  Stable probable chronic mild interstitial prominence.   Original Report Authenticated By: Natasha Mead, M.D.      MDM  Iv ns. Labs.  Reviewed nursing notes and prior charts for additional history.    Date: 04/15/2013  Rate: 72  Rhythm: normal sinus rhythm  QRS Axis: normal  Intervals: normal  ST/T Wave abnormalities: normal  Conduction Disutrbances:none  Narrative Interpretation:   Old EKG Reviewed: unchanged  Recheck on a few occasions during ed stay, pt resting comfortably each time.  Easily aroused.  No recurrent chest pain or discomfort.  Pts cp v brief, and seemingly not consistent w acs, and unlike prior cardiac related symtoms.  Pt remains completely asymptomatic on recheck and appears stable for d/c.      Suzi Roots, MD 04/15/13 [id number]

## 2013-04-15 NOTE — ED Notes (Signed)
md at bedside

## 2013-04-15 NOTE — ED Notes (Signed)
md at bedside  Pt resting with eyes closed in bed.

## 2013-04-15 NOTE — ED Notes (Signed)
Pt alert and oriented x4. Respirations even and unlabored, bilateral symmetrical rise and fall of chest. Skin warm and dry. In no acute distress. Denies needs.   

## 2013-04-15 NOTE — ED Notes (Signed)
Pt escorted to discharge window. Pt verbalized understanding discharge instructions. In no acute distress.  

## 2013-04-15 NOTE — ED Notes (Signed)
Pt back from x-ray.

## 2013-04-24 ENCOUNTER — Telehealth: Payer: Self-pay | Admitting: Family Medicine

## 2013-04-24 MED ORDER — TRAMADOL HCL 50 MG PO TABS: 50.0000 mg | ORAL_TABLET | Freq: Four times a day (QID) | ORAL | Status: DC | PRN

## 2013-04-24 NOTE — Telephone Encounter (Signed)
Left message on [facility] VM with tramadol refill.  Jazmin Hartsell,CMA

## 2013-04-24 NOTE — Telephone Encounter (Signed)
Pt is calling for a refill on Tramadol. The [facility] would not refill this and he said they have faxed [place] twice concerning this with no answer. JW

## 2013-04-24 NOTE — Telephone Encounter (Signed)
Will forward to MD. Jazmin Hartsell,CMA  

## 2013-05-15 ENCOUNTER — Encounter: Payer: Self-pay | Admitting: Cardiology

## 2013-05-15 ENCOUNTER — Ambulatory Visit (INDEPENDENT_AMBULATORY_CARE_PROVIDER_SITE_OTHER): Payer: No Typology Code available for payment source | Admitting: Cardiology

## 2013-05-15 VITALS — BP 124/70 | HR 68 | Ht 67.0 in | Wt 274.0 lb

## 2013-05-15 DIAGNOSIS — I10 Essential (primary) hypertension: Secondary | ICD-10-CM

## 2013-05-15 DIAGNOSIS — I2510 Atherosclerotic heart disease of native coronary artery without angina pectoris: Secondary | ICD-10-CM

## 2013-05-15 DIAGNOSIS — E785 Hyperlipidemia, unspecified: Secondary | ICD-10-CM

## 2013-05-15 DIAGNOSIS — R079 Chest pain, unspecified: Secondary | ICD-10-CM

## 2013-05-15 NOTE — Patient Instructions (Signed)
We will get your records from [place], [place]  If they did not do a stress test, we will get one here.  Continue your current medication  I will see you in 6 months.  Congratulations on quitting smoking

## 2013-05-15 NOTE — Progress Notes (Signed)
[name] [name] Date of Birth: 08/17/1961 Medical Record #[id number]  History of Present Illness: [name] [name] is seen for followup today. He has a history of coronary disease and is status post stenting of the right coronary in May of 2013. He reports that on 04/15/2013 while at his primary care office he developed symptoms of chest pain. He states it felt like a brick was lying on his chest. It felt heavy. The pain would wax and wane. He went to the emergency room at [facility] evaluation there was unremarkable. Again on August 19 he was visiting family in [place] when he experienced chest pain while at a gas station. He went to a hospital in [place] and was admitted overnight. He apparently ruled out for myocardial infarction. It is unclear what the extent of his evaluation was there. He cannot recall whether he had a stress test but states that he had some type of scan on 2 occasions. He does report that he quit smoking in June. He is no longer drinking alcohol. He has had no recurrent chest pain since his episode on August 19.  Current Outpatient Prescriptions on File Prior to Visit  Medication Sig Dispense Refill  . ACCU-CHEK FASTCLIX LANCETS MISC 1 Units by Percutaneous route 4 (four) times daily.  100 each  12  . aspirin 81 MG chewable tablet Chew 81 mg by mouth daily.      Marland Kitchen atenolol (TENORMIN) 25 MG tablet Take 25 mg by mouth daily.      Marland Kitchen glipiZIDE (GLUCOTROL) 10 MG tablet Take 10 mg by mouth daily before breakfast.      . insulin aspart (NOVOLOG) 100 UNIT/ML injection Inject 8 Units into the skin 3 (three) times daily with meals.      . insulin glargine (LANTUS) 100 UNIT/ML injection Inject 30 Units into the skin 2 (two) times daily.      . Insulin Syringe-Needle U-100 (FREESTYLE PRECISION INS SYR) 31G X 5/16" 1 ML MISC 1 Units by Does not apply route as directed.  100 each  5  . lisinopril (PRINIVIL,ZESTRIL) 5 MG tablet Take 2.5 mg by mouth 2 (two) times daily.       . metFORMIN (GLUCOPHAGE) 1000 MG tablet Take 1,000 mg by mouth 2 (two) times daily with a meal.      . simvastatin (ZOCOR) 40 MG tablet Take 40 mg by mouth every evening.      . traMADol (ULTRAM) 50 MG tablet Take 1 tablet (50 mg total) by mouth every 6 (six) hours as needed for pain.  42 tablet  2   No current facility-administered medications on file prior to visit.    Allergies  Allergen Reactions  . Shellfish Allergy Nausea And Vomiting    Past Medical History  Diagnosis Date  . Hypertension   . Hyperlipidemia   . DM (diabetes mellitus)   . OSA on CPAP 01/19/2012  . CAD (coronary artery disease) 01/18/12    BMS to RCA    Past Surgical History  Procedure Laterality Date  . No past surgeries    . Coronary stent placement      History  Smoking status  . Current Some Day Smoker -- 0.50 packs/day for 35 years  . Types: Cigarettes  Smokeless tobacco  . Never Used    History  Alcohol Use  . Yes    Comment: 12 beers twice a week.    Family History  Problem Relation Age of Onset  . Heart attack  Father 64  . Heart attack Brother   . Stroke Brother   . Cancer Mother     breast  . Hypertension Sister   . Hyperlipidemia Sister   . Hyperlipidemia Sister   . Hypertension Sister     Review of Systems: As noted in history of present illness.  All other systems were reviewed and are negative.  Physical Exam: BP 124/70  Pulse 68  Ht 5\' 7"  (1.702 m)  Wt 274 lb (124.286 kg)  BMI 42.9 kg/m2 He is an obese white male in no acute distress.  The skin is warm and dry.   The HEENT exam is unremarkable. The carotids are 2+ without bruits.  There is no thyromegaly.  There is no JVD.  The lungs are clear.  The chest wall is non tender.  The heart exam reveals a regular rate with a normal S1 and S2.  There are no murmurs, gallops, or rubs.  The PMI is not displaced.   Abdominal exam reveals good bowel sounds. It is morbidly obese. There is no hepatosplenomegaly or tenderness.   There are no masses.  Exam of the legs reveal no clubbing, cyanosis, or edema.  The legs are without rashes.  The distal pulses are intact.  Cranial nerves II - XII are intact.  Motor and sensory functions are intact.  The gait is normal. LABORATORY DATA: ECG today demonstrates normal sinus rhythm with a rate of 68 beats per minute. It is otherwise normal. Assessment / Plan: 1. Coronary disease status post bare-metal stent of the RCA in May of 2013. He has had 2 recent episodes of chest pain. Have requested records from his hospitalization in [place]. If he did not have a stress test at that time we will schedule him for a stress Myoview. We will continue with his beta blocker, ACE inhibitor, and statin therapy. Continue aspirin daily.  2. Hypertension, well controlled.   3. Diabetes mellitus, poorly controlled. Patient reports blood sugars ranging from 1:30 to 200. This is managed by his primary care.  4. Hyperlipidemia.

## 2013-05-16 ENCOUNTER — Ambulatory Visit (INDEPENDENT_AMBULATORY_CARE_PROVIDER_SITE_OTHER): Payer: No Typology Code available for payment source | Admitting: Family Medicine

## 2013-05-16 ENCOUNTER — Encounter: Payer: Self-pay | Admitting: Family Medicine

## 2013-05-16 VITALS — BP 101/64 | HR 59 | Temp 97.4°F | Ht 66.5 in | Wt 270.0 lb

## 2013-05-16 DIAGNOSIS — F17200 Nicotine dependence, unspecified, uncomplicated: Secondary | ICD-10-CM

## 2013-05-16 DIAGNOSIS — Z720 Tobacco use: Secondary | ICD-10-CM

## 2013-05-16 DIAGNOSIS — I10 Essential (primary) hypertension: Secondary | ICD-10-CM

## 2013-05-16 DIAGNOSIS — I2510 Atherosclerotic heart disease of native coronary artery without angina pectoris: Secondary | ICD-10-CM

## 2013-05-16 DIAGNOSIS — E785 Hyperlipidemia, unspecified: Secondary | ICD-10-CM

## 2013-05-16 DIAGNOSIS — E669 Obesity, unspecified: Secondary | ICD-10-CM

## 2013-05-16 DIAGNOSIS — E119 Type 2 diabetes mellitus without complications: Secondary | ICD-10-CM

## 2013-05-16 MED ORDER — LISINOPRIL 5 MG PO TABS: 2.5000 mg | ORAL_TABLET | Freq: Two times a day (BID) | ORAL | Status: DC

## 2013-05-16 MED ORDER — SIMVASTATIN 20 MG PO TABS: 20.0000 mg | ORAL_TABLET | Freq: Every evening | ORAL | Status: DC

## 2013-05-16 MED ORDER — ATENOLOL 25 MG PO TABS: 25.0000 mg | ORAL_TABLET | Freq: Every day | ORAL | Status: DC

## 2013-05-16 MED ORDER — METFORMIN HCL 1000 MG PO TABS: 1000.0000 mg | ORAL_TABLET | Freq: Two times a day (BID) | ORAL | Status: DC

## 2013-05-16 MED ORDER — GLIPIZIDE 10 MG PO TABS: 10.0000 mg | ORAL_TABLET | Freq: Every day | ORAL | Status: DC

## 2013-05-16 NOTE — Progress Notes (Signed)
  Subjective:    Patient ID: Abdinasir Spadafore, male    DOB: April 25, 1961, 52 y.o.   MRN: [id number]  HPI 1.  [id number]--WGN quit drinking beer and almost completely stopped smoking.  Needs eye exam. Giving himself his on insulin now--Issue with Novolog is not having enough insulin syringes. 2.   Went to outside ED with CP, has seen cards--they will repeat stress testing ASAP. 3.  Needs medication refill.   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   Gastrointestinal: Negative for nausea and blood in stool.  Musculoskeletal: Negative for back pain.  Skin: Negative for pallor and rash.       Objective:   Physical Exam  Vitals reviewed. Constitutional: He appears well-developed and well-nourished. No distress.  HENT:  Head: Normocephalic and atraumatic.  Eyes: No scleral icterus.  Neck: Normal range of motion. Neck supple.  Cardiovascular: Normal rate.   Pulmonary/Chest: Effort normal.  Abdominal: Soft. There is no tenderness.  Skin: Skin is warm.  Psychiatric: He has a normal mood and affect.          Assessment & Plan:

## 2013-05-16 NOTE — Assessment & Plan Note (Signed)
For repeat stress test with cards.

## 2013-05-16 NOTE — Assessment & Plan Note (Signed)
Slow improvement of Hgb A1C, now down to 9.2.  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

## 2013-05-16 NOTE — Patient Instructions (Signed)
Heart Disease Prevention Heart disease can lead to heart attacks and strokes. This is a leading cause of death. Heart disease can be inherited and can be caused from the lifestyle you lead. You can do a lot to keep your heart and blood vessels healthy.  WHAT SHOULD I DO EACH DAY TO KEEP MY HEART HEALTHY?  Do not smoke.  Follow a healthy eating plan as recommended by your caregiver or dietitian.  Be active for a total of 30 minutes most days. Ask your caregiver what activities are best for you.  Limit the amount of alcohol you drink.  Involve family and friends to help you with a healthy lifestyle. HOW DOES HEART DISEASE CAUSE HIGH BLOOD PRESSURE?  Narrowed blood vessels leave a smaller opening for blood to flow through. It is like turning on a garden hose and holding your thumb over the opening. The smaller opening makes the water shoot out with more pressure. In the same way, narrowed blood vessels can lead to high blood pressure. Other factors, such as kidney problems and being overweight, also can lead to high blood pressure.  If you have high blood pressure you may need to take blood pressure medicine every day. Some types of blood pressure medicine can also help keep your kidneys healthy.  Many people with diabetes also have high blood pressure. If you have heart, eye, or kidney problems from diabetes, high blood pressure can make them worse. HOW DO MY BLOOD VESSELS GET CLOGGED?  Cholesterol is a substance that is made by the body and used for many important functions. It is also found in food that comes from animals. When your cholesterol is high, it can stick to the insides of your blood vessels, making them narrowed and even clogged. This problem is called atherosclerosis.  Narrowed and clogged blood vessels make it harder for blood to get to important body organs. This can cause problems such as:  Chest pain (angina). Angina can cause temporary pain in your chest, arms, shoulders,  or back. You may feel the pain more when your heart beats faster, such as when you exercise. The pain may go away when you rest. You also may feel very weak and sweaty.  A heart attack. A heart attack happens when a blood vessel in or near the heart becomes blocked. Not enough blood is getting to the heart. During a heart attack, you may have chest pain in your chest, arms, shoulders, or back along with nausea, indigestion, extreme weakness, and sweating. WHAT CAN I DO TO PREVENT HEART DISEASE?   Keep your blood pressure under control as recommended by your caregiver.  Keep your cholesterol under control. Have it checked at least once a year. Target cholesterol levels for most people are:  Total blood cholesterol level: Below 200.  LDL (bad) cholesterol: Below 100.  HDL (good) cholesterol: Above 40 in men and above 50 in women.  Triglycerides (another type of fat in the blood): Below 150.  Make physical activity a part of your daily routine. Check with your caregiver to learn what activities are best for you.  Make sure that the foods you eat are "heart-healthy."  Include foods high in fiber, such as oat bran, oatmeal, whole-grain breads and cereals.  Cut back on fried foods and foods high in saturated fat. This includes foods such as meats, butter, whole dairy products, shortening, and coconut or palm oil.  Avoid salty foods such as canned food, luncheon meat, salty snacks, and fast food.    Eat more fruits and vegetables.  Drink less alcohol.  Lose weight as recommended by your caregiver.  If you smoke, quit. Your caregiver can help you with quitting options.  Ask your caregiver whether you should take a daily aspirin. Studies have shown that taking aspirin can help reduce your risk of heart disease and stroke.  Take your prescribed medicines as directed. WHAT ARE THE WARNING SIGNS OF A HEART ATTACK? You may have one or more of the following warning signs:  Chest pain or  discomfort.  Pain or discomfort in your arms, back, jaw, or neck.  Indigestion or stomach pain.  Shortness of breath.  Sweating.  Nausea or vomiting.  Lightheadedness.  No warning signs at all or they may come and go. FOR MORE INFORMATION  To find out more about heart disease and stroke prevention, visit the American Heart Association website at www.americanheart.org Document Released: 04/12/2004 Document Revised: 02/28/2012 Document Reviewed: 10/26/2007 [facility] Patient Information 2014 [place], [place]. Smoking Cessation Quitting smoking is important to your health and has many advantages. However, it is not always easy to quit since nicotine is a very addictive drug. Often times, people try 3 times or more before being able to quit. This document explains the best ways for you to prepare to quit smoking. Quitting takes hard work and a lot of effort, but you can do it. ADVANTAGES OF QUITTING SMOKING  You will live longer, feel better, and live better.  Your body will feel the impact of quitting smoking almost immediately.  Within 20 minutes, blood pressure decreases. Your pulse returns to its normal level.  After 8 hours, carbon monoxide levels in the blood return to normal. Your oxygen level increases.  After 24 hours, the chance of having a heart attack starts to decrease. Your breath, hair, and body stop smelling like smoke.  After 48 hours, damaged nerve endings begin to recover. Your sense of taste and smell improve.  After 72 hours, the body is virtually free of nicotine. Your bronchial tubes relax and breathing becomes easier.  After 2 to 12 weeks, lungs can hold more air. Exercise becomes easier and circulation improves.  The risk of having a heart attack, stroke, cancer, or lung disease is greatly reduced.  After 1 year, the risk of coronary heart disease is cut in half.  After 5 years, the risk of stroke falls to the same as a nonsmoker.  After 10 years, the  risk of lung cancer is cut in half and the risk of other cancers decreases significantly.  After 15 years, the risk of coronary heart disease drops, usually to the level of a nonsmoker.  If you are pregnant, quitting smoking will improve your chances of having a healthy baby.  The people you live with, especially any children, will be healthier.  You will have extra money to spend on things other than cigarettes. QUESTIONS TO THINK ABOUT BEFORE ATTEMPTING TO QUIT You may want to talk about your answers with your caregiver.  Why do you want to quit?  If you tried to quit in the past, what helped and what did not?  What will be the most difficult situations for you after you quit? How will you plan to handle them?  Who can help you through the tough times? Your family? Friends? A caregiver?  What pleasures do you get from smoking? What ways can you still get pleasure if you quit? Here are some questions to ask your caregiver:  How can you help  me to be successful at quitting?  What medicine do you think would be best for me and how should I take it?  What should I do if I need more help?  What is smoking withdrawal like? How can I get information on withdrawal? GET READY  Set a quit date.  Change your environment by getting rid of all cigarettes, ashtrays, matches, and lighters in your home, car, or work. Do not let people smoke in your home.  Review your past attempts to quit. Think about what worked and what did not. GET SUPPORT AND ENCOURAGEMENT You have a better chance of being successful if you have help. You can get support in many ways.  Tell your family, friends, and co-workers that you are going to quit and need their support. Ask them not to smoke around you.  Get individual, group, or telephone counseling and support. Programs are available at [employer] and health centers. Call your local health department for information about programs in your  area.  Spiritual beliefs and practices may help some smokers quit.  Download a "quit meter" on your computer to keep track of quit statistics, such as how long you have gone without smoking, cigarettes not smoked, and money saved.  Get a self-help book about quitting smoking and staying off of tobacco. LEARN NEW SKILLS AND BEHAVIORS  Distract yourself from urges to smoke. Talk to someone, go for a walk, or occupy your time with a task.  Change your normal routine. Take a different route to work. Drink tea instead of coffee. Eat breakfast in a different place.  Reduce your stress. Take a hot bath, exercise, or read a book.  Plan something enjoyable to do every day. Reward yourself for not smoking.  Explore interactive web-based programs that specialize in helping you quit. GET MEDICINE AND USE IT CORRECTLY Medicines can help you stop smoking and decrease the urge to smoke. Combining medicine with the above behavioral methods and support can greatly increase your chances of successfully quitting smoking.  Nicotine replacement therapy helps deliver nicotine to your body without the negative effects and risks of smoking. Nicotine replacement therapy includes nicotine gum, lozenges, inhalers, nasal sprays, and skin patches. Some may be available over-the-counter and others require a prescription.  Antidepressant medicine helps people abstain from smoking, but how this works is unknown. This medicine is available by prescription.  Nicotinic receptor partial agonist medicine simulates the effect of nicotine in your brain. This medicine is available by prescription. Ask your caregiver for advice about which medicines to use and how to use them based on your health history. Your caregiver will tell you what side effects to look out for if you choose to be on a medicine or therapy. Carefully read the information on the package. Do not use any other product containing nicotine while using a nicotine  replacement product.  RELAPSE OR DIFFICULT SITUATIONS Most relapses occur within the first 3 months after quitting. Do not be discouraged if you start smoking again. Remember, most people try several times before finally quitting. You may have symptoms of withdrawal because your body is used to nicotine. You may crave cigarettes, be irritable, feel very hungry, cough often, get headaches, or have difficulty concentrating. The withdrawal symptoms are only temporary. They are strongest when you first quit, but they will go away within 10 14 days. To reduce the chances of relapse, try to:  Avoid drinking alcohol. Drinking lowers your chances of successfully quitting.  Reduce the amount  of caffeine you consume. Once you quit smoking, the amount of caffeine in your body increases and can give you symptoms, such as a rapid heartbeat, sweating, and anxiety.  Avoid smokers because they can make you want to smoke.  Do not let weight gain distract you. Many smokers will gain weight when they quit, usually less than 10 pounds. Eat a healthy diet and stay active. You can always lose the weight gained after you quit.  Find ways to improve your mood other than smoking. FOR MORE INFORMATION  www.smokefree.gov  Document Released: 08/23/2001 Document Revised: 02/28/2012 Document Reviewed: 12/08/2011 [facility] Patient Information 2014 [place], [place].

## 2013-05-16 NOTE — Assessment & Plan Note (Signed)
At goal.  

## 2013-05-16 NOTE — Assessment & Plan Note (Signed)
Working on quitting--has cut way down.

## 2013-05-16 NOTE — Assessment & Plan Note (Signed)
Work on weight loss.

## 2013-06-25 ENCOUNTER — Telehealth: Payer: Self-pay | Admitting: Cardiology

## 2013-06-25 NOTE — Telephone Encounter (Signed)
New Problem:  Pt is calling to schedule a stress test. I do not see an order or a recall for a stress test. Could you please clarify if pt needs a stress test. If so, please put an order in. Thanks... Pt would also like a call back to know if he needs the test.

## 2013-07-11 NOTE — Telephone Encounter (Signed)
Patient called no answer.Left message on personal voice mail received 04/30/13 discharge summary from [facility] but never have received stress test, have faxed 2 request for a copy of stress test report.Will wait to see if receive stress test.

## 2013-07-15 NOTE — Telephone Encounter (Signed)
Returned call to patient received  05/01/13 stress test from [facility] will show to Dr.Jordan.Follow up appointment scheduled with Dr.Jordan 11/14/13 at 3:00 pm.

## 2013-07-31 ENCOUNTER — Encounter: Payer: Self-pay | Admitting: Family Medicine

## 2013-07-31 ENCOUNTER — Ambulatory Visit (INDEPENDENT_AMBULATORY_CARE_PROVIDER_SITE_OTHER): Payer: No Typology Code available for payment source | Admitting: Family Medicine

## 2013-07-31 VITALS — BP 134/73 | HR 69 | Temp 98.8°F | Wt 270.0 lb

## 2013-07-31 DIAGNOSIS — M549 Dorsalgia, unspecified: Secondary | ICD-10-CM

## 2013-07-31 DIAGNOSIS — Z23 Encounter for immunization: Secondary | ICD-10-CM

## 2013-07-31 DIAGNOSIS — I10 Essential (primary) hypertension: Secondary | ICD-10-CM

## 2013-07-31 DIAGNOSIS — E119 Type 2 diabetes mellitus without complications: Secondary | ICD-10-CM

## 2013-07-31 DIAGNOSIS — F17200 Nicotine dependence, unspecified, uncomplicated: Secondary | ICD-10-CM

## 2013-07-31 DIAGNOSIS — Z720 Tobacco use: Secondary | ICD-10-CM

## 2013-07-31 MED ORDER — INSULIN ASPART 100 UNIT/ML ~~LOC~~ SOLN: 12.0000 [IU] | Freq: Three times a day (TID) | SUBCUTANEOUS | Status: DC

## 2013-07-31 NOTE — Patient Instructions (Addendum)
Calorie Counting Diet A calorie counting diet requires you to eat the number of calories that are right for you in a day. Calories are the measurement of how much energy you get from the food you eat. Eating the right amount of calories is important for staying at a healthy weight. If you eat too many calories, your body will store them as fat and you may gain weight. If you eat too few calories, you may lose weight. Counting the number of calories you eat during a day will help you know if you are eating the right amount. A Registered Dietitian can determine how many calories you need in a day. The amount of calories needed varies from person to person. If your goal is to lose weight, you will need to eat fewer calories. Losing weight can benefit you if you are overweight or have health problems such as heart disease, high blood pressure, or diabetes. If your goal is to gain weight, you will need to eat more calories. Gaining weight may be necessary if you have a certain health problem that causes your body to need more energy. TIPS Whether you are increasing or decreasing the number of calories you eat during a day, it may be hard to get used to changes in what you eat and drink. The following are tips to help you keep track of the number of calories you eat.  Measure foods at home with measuring cups. This helps you know the amount of food and number of calories you are eating.  Restaurants often serve food in amounts that are larger than 1 serving. While eating out, estimate how many servings of a food you are given. For example, a serving of cooked rice is  cup or about the size of half of a fist. Knowing serving sizes will help you be aware of how much food you are eating at restaurants.  Ask for smaller portion sizes or child-size portions at restaurants.  Plan to eat half of a meal at a restaurant. Take the rest home or share the other half with a friend.  Read the Nutrition Facts panel on  food labels for calorie content and serving size. You can find out how many servings are in a package, the size of a serving, and the number of calories each serving has.  For example, a package might contain 3 cookies. The Nutrition Facts panel on that package says that 1 serving is 1 cookie. Below that, it will say there are 3 servings in the container. The calories section of the Nutrition Facts label says there are 90 calories. This means there are 90 calories in 1 cookie (1 serving). If you eat 1 cookie you have eaten 90 calories. If you eat all 3 cookies, you have eaten 270 calories (3 servings x 90 calories = 270 calories). The list below tells you how big or small some common portion sizes are.  1 oz.........4 stacked dice.  3 oz.........Deck of cards.  1 tsp........Tip of little finger.  1 tbs........Thumb.  2 tbs........Golf ball.   cup.......Half of a fist.  1 cup........A fist. KEEP A FOOD LOG Write down every food item you eat, the amount you eat, and the number of calories in each food you eat during the day. At the end of the day, you can add up the total number of calories you have eaten. It may help to keep a list like the one below. Find out the calorie information by reading the   Nutrition Facts panel on food labels. Breakfast  Bran cereal (1 cup, 110 calories).  Fat-free milk ( cup, 45 calories). Snack  Apple (1 medium, 80 calories). Lunch  Spinach (1 cup, 20 calories).  Tomato ( medium, 20 calories).  Chicken breast strips (3 oz, 165 calories).  Shredded cheddar cheese ( cup, 110 calories).  Light [place] dressing (2 tbs, 60 calories).  Whole-wheat bread (1 slice, 80 calories).  Tub margarine (1 tsp, 35 calories).  Vegetable soup (1 cup, 160 calories). Dinner  Pork chop (3 oz, 190 calories).  Brown rice (1 cup, 215 calories).  Steamed broccoli ( cup, 20 calories).  Strawberries (1  cup, 65 calories).  Whipped cream (1 tbs, 50  calories). Daily Calorie Total: 1425 Document Released: 08/29/2005 Document Revised: 11/21/2011 Document Reviewed: 02/23/2007 [facility] Patient Information 2014 [place], [place]. Smoking Cessation Quitting smoking is important to your health and has many advantages. However, it is not always easy to quit since nicotine is a very addictive drug. Often times, people try 3 times or more before being able to quit. This document explains the best ways for you to prepare to quit smoking. Quitting takes hard work and a lot of effort, but you can do it. ADVANTAGES OF QUITTING SMOKING  You will live longer, feel better, and live better.  Your body will feel the impact of quitting smoking almost immediately.  Within 20 minutes, blood pressure decreases. Your pulse returns to its normal level.  After 8 hours, carbon monoxide levels in the blood return to normal. Your oxygen level increases.  After 24 hours, the chance of having a heart attack starts to decrease. Your breath, hair, and body stop smelling like smoke.  After 48 hours, damaged nerve endings begin to recover. Your sense of taste and smell improve.  After 72 hours, the body is virtually free of nicotine. Your bronchial tubes relax and breathing becomes easier.  After 2 to 12 weeks, lungs can hold more air. Exercise becomes easier and circulation improves.  The risk of having a heart attack, stroke, cancer, or lung disease is greatly reduced.  After 1 year, the risk of coronary heart disease is cut in half.  After 5 years, the risk of stroke falls to the same as a nonsmoker.  After 10 years, the risk of lung cancer is cut in half and the risk of other cancers decreases significantly.  After 15 years, the risk of coronary heart disease drops, usually to the level of a nonsmoker.  If you are pregnant, quitting smoking will improve your chances of having a healthy baby.  The people you live with, especially any children, will be  healthier.  You will have extra money to spend on things other than cigarettes. QUESTIONS TO THINK ABOUT BEFORE ATTEMPTING TO QUIT You may want to talk about your answers with your caregiver.  Why do you want to quit?  If you tried to quit in the past, what helped and what did not?  What will be the most difficult situations for you after you quit? How will you plan to handle them?  Who can help you through the tough times? Your family? Friends? A caregiver?  What pleasures do you get from smoking? What ways can you still get pleasure if you quit? Here are some questions to ask your caregiver:  How can you help me to be successful at quitting?  What medicine do you think would be best for me and how should I take it?  What  should I do if I need more help?  What is smoking withdrawal like? How can I get information on withdrawal? GET READY  Set a quit date.  Change your environment by getting rid of all cigarettes, ashtrays, matches, and lighters in your home, car, or work. Do not let people smoke in your home.  Review your past attempts to quit. Think about what worked and what did not. GET SUPPORT AND ENCOURAGEMENT You have a better chance of being successful if you have help. You can get support in many ways.  Tell your family, friends, and co-workers that you are going to quit and need their support. Ask them not to smoke around you.  Get individual, group, or telephone counseling and support. Programs are available at [employer] and health centers. Call your local health department for information about programs in your area.  Spiritual beliefs and practices may help some smokers quit.  Download a "quit meter" on your computer to keep track of quit statistics, such as how long you have gone without smoking, cigarettes not smoked, and money saved.  Get a self-help book about quitting smoking and staying off of tobacco. LEARN NEW SKILLS AND BEHAVIORS  Distract  yourself from urges to smoke. Talk to someone, go for a walk, or occupy your time with a task.  Change your normal routine. Take a different route to work. Drink tea instead of coffee. Eat breakfast in a different place.  Reduce your stress. Take a hot bath, exercise, or read a book.  Plan something enjoyable to do every day. Reward yourself for not smoking.  Explore interactive web-based programs that specialize in helping you quit. GET MEDICINE AND USE IT CORRECTLY Medicines can help you stop smoking and decrease the urge to smoke. Combining medicine with the above behavioral methods and support can greatly increase your chances of successfully quitting smoking.  Nicotine replacement therapy helps deliver nicotine to your body without the negative effects and risks of smoking. Nicotine replacement therapy includes nicotine gum, lozenges, inhalers, nasal sprays, and skin patches. Some may be available over-the-counter and others require a prescription.  Antidepressant medicine helps people abstain from smoking, but how this works is unknown. This medicine is available by prescription.  Nicotinic receptor partial agonist medicine simulates the effect of nicotine in your brain. This medicine is available by prescription. Ask your caregiver for advice about which medicines to use and how to use them based on your health history. Your caregiver will tell you what side effects to look out for if you choose to be on a medicine or therapy. Carefully read the information on the package. Do not use any other product containing nicotine while using a nicotine replacement product.  RELAPSE OR DIFFICULT SITUATIONS Most relapses occur within the first 3 months after quitting. Do not be discouraged if you start smoking again. Remember, most people try several times before finally quitting. You may have symptoms of withdrawal because your body is used to nicotine. You may crave cigarettes, be irritable, feel  very hungry, cough often, get headaches, or have difficulty concentrating. The withdrawal symptoms are only temporary. They are strongest when you first quit, but they will go away within 10 14 days. To reduce the chances of relapse, try to:  Avoid drinking alcohol. Drinking lowers your chances of successfully quitting.  Reduce the amount of caffeine you consume. Once you quit smoking, the amount of caffeine in your body increases and can give you symptoms, such as a rapid  heartbeat, sweating, and anxiety.  Avoid smokers because they can make you want to smoke.  Do not let weight gain distract you. Many smokers will gain weight when they quit, usually less than 10 pounds. Eat a healthy diet and stay active. You can always lose the weight gained after you quit.  Find ways to improve your mood other than smoking. FOR MORE INFORMATION  www.smokefree.gov  Document Released: 08/23/2001 Document Revised: 02/28/2012 Document Reviewed: 12/08/2011 [facility] Patient Information 2014 [place], [place]. Back Exercises Back exercises help treat and prevent back injuries. The goal of back exercises is to increase the strength of your abdominal and back muscles and the flexibility of your back. These exercises should be started when you no longer have back pain. Back exercises include:  Pelvic Tilt. Lie on your back with your knees bent. Tilt your pelvis until the lower part of your back is against the floor. Hold this position 5 to 10 sec and repeat 5 to 10 times.  Knee to Chest. Pull first 1 knee up against your chest and hold for 20 to 30 seconds, repeat this with the other knee, and then both knees. This may be done with the other leg straight or bent, whichever feels better.  Sit-Ups or Curl-Ups. Bend your knees 90 degrees. Start with tilting your pelvis, and do a partial, slow sit-up, lifting your trunk only 30 to 45 degrees off the floor. Take at least 2 to 3 seconds for each sit-up. Do not do sit-ups  with your knees out straight. If partial sit-ups are difficult, simply do the above but with only tightening your abdominal muscles and holding it as directed.  Hip-Lift. Lie on your back with your knees flexed 90 degrees. Push down with your feet and shoulders as you raise your hips a couple inches off the floor; hold for 10 seconds, repeat 5 to 10 times.  Back arches. Lie on your stomach, propping yourself up on bent elbows. Slowly press on your hands, causing an arch in your low back. Repeat 3 to 5 times. Any initial stiffness and discomfort should lessen with repetition over time.  Shoulder-Lifts. Lie face down with arms beside your body. Keep hips and torso pressed to floor as you slowly lift your head and shoulders off the floor. Do not overdo your exercises, especially in the beginning. Exercises may cause you some mild back discomfort which lasts for a few minutes; however, if the pain is more severe, or lasts for more than 15 minutes, do not continue exercises until you see your caregiver. Improvement with exercise therapy for back problems is slow.  See your caregivers for assistance with developing a proper back exercise program. Document Released: 10/06/2004 Document Revised: 11/21/2011 Document Reviewed: 06/30/2011 [facility] Patient Information 2014 [place], [place].

## 2013-08-01 NOTE — Assessment & Plan Note (Signed)
Trial of NSAIDS, back exercises given

## 2013-08-01 NOTE — Assessment & Plan Note (Signed)
Increased Novolog to 12 u with meals--continue glucotrol

## 2013-08-01 NOTE — Progress Notes (Signed)
  Subjective:    Patient ID: [name] [name], male    DOB: 1960-10-25, 52 y.o.   MRN: [id number]  Back Pain This is a chronic problem. The current episode started more than 1 year ago. The problem is unchanged. The pain is present in the lumbar spine. The quality of the pain is described as aching. The pain does not radiate. The pain is moderate. Pertinent negatives include no abdominal pain, chest pain, dysuria or fever. Risk factors include history of cancer, lack of exercise, poor posture, sedentary lifestyle and obesity. He has tried analgesics for the symptoms. The treatment provided mild relief.    Returns for f/u.  Is taking increased doses of Novolog.  Still cannot get more than 100 insulin syringes/month, so cannot dose Novolog 3x/daily Last BS last pm was 185.  Does not bring BS log.   Still trying to work on smoking cessation. To f/u with cards in 3/15. Taking other meds as directed.  Review of Systems  Constitutional: Negative for fever and chills.  Respiratory: Negative for shortness of breath and wheezing.   Cardiovascular: Negative for chest pain.  Gastrointestinal: Negative for abdominal pain.  Genitourinary: Negative for dysuria.  Musculoskeletal: Positive for back pain.  Skin: Negative for rash.       Objective:   Physical Exam  Vitals reviewed. Constitutional: He is oriented to person, place, and time. He appears well-developed and well-nourished.  HENT:  Head: Normocephalic and atraumatic.  Eyes: No scleral icterus.  Neck: Neck supple.  Cardiovascular: Normal rate.   Very distant  Pulmonary/Chest: Effort normal.  Abdominal: Soft. There is no rebound.  Musculoskeletal: He exhibits no edema.  Neurological: He is alert and oriented to person, place, and time.  Skin: Skin is warm. No rash noted.  Psychiatric: He has a normal mood and affect.          Assessment & Plan:

## 2013-08-01 NOTE — Assessment & Plan Note (Signed)
BP at goal 

## 2013-08-01 NOTE — Assessment & Plan Note (Signed)
Continue to work on quitting smoking

## 2013-08-28 ENCOUNTER — Ambulatory Visit: Payer: No Typology Code available for payment source

## 2013-10-04 ENCOUNTER — Encounter: Payer: Self-pay | Admitting: Family Medicine

## 2013-10-04 ENCOUNTER — Ambulatory Visit (INDEPENDENT_AMBULATORY_CARE_PROVIDER_SITE_OTHER): Payer: No Typology Code available for payment source | Admitting: Family Medicine

## 2013-10-04 VITALS — BP 118/69 | HR 65 | Temp 98.7°F | Ht 67.0 in | Wt 268.0 lb

## 2013-10-04 DIAGNOSIS — E119 Type 2 diabetes mellitus without complications: Secondary | ICD-10-CM

## 2013-10-04 DIAGNOSIS — I2510 Atherosclerotic heart disease of native coronary artery without angina pectoris: Secondary | ICD-10-CM

## 2013-10-04 DIAGNOSIS — M549 Dorsalgia, unspecified: Secondary | ICD-10-CM

## 2013-10-04 DIAGNOSIS — R0600 Dyspnea, unspecified: Secondary | ICD-10-CM

## 2013-10-04 DIAGNOSIS — R0989 Other specified symptoms and signs involving the circulatory and respiratory systems: Secondary | ICD-10-CM

## 2013-10-04 DIAGNOSIS — R0609 Other forms of dyspnea: Secondary | ICD-10-CM

## 2013-10-04 DIAGNOSIS — R0602 Shortness of breath: Secondary | ICD-10-CM

## 2013-10-04 DIAGNOSIS — Z720 Tobacco use: Secondary | ICD-10-CM

## 2013-10-04 DIAGNOSIS — E785 Hyperlipidemia, unspecified: Secondary | ICD-10-CM

## 2013-10-04 DIAGNOSIS — I10 Essential (primary) hypertension: Secondary | ICD-10-CM

## 2013-10-04 DIAGNOSIS — F17200 Nicotine dependence, unspecified, uncomplicated: Secondary | ICD-10-CM

## 2013-10-04 LAB — POCT GLYCOSYLATED HEMOGLOBIN (HGB A1C): HEMOGLOBIN A1C: 7.3

## 2013-10-04 MED ORDER — LISINOPRIL 5 MG PO TABS: 2.5000 mg | ORAL_TABLET | Freq: Two times a day (BID) | ORAL | Status: DC

## 2013-10-04 MED ORDER — ASPIRIN 81 MG PO CHEW: 81.0000 mg | CHEWABLE_TABLET | Freq: Every day | ORAL | Status: AC

## 2013-10-04 MED ORDER — ALBUTEROL SULFATE HFA 108 (90 BASE) MCG/ACT IN AERS: 2.0000 | INHALATION_SPRAY | Freq: Four times a day (QID) | RESPIRATORY_TRACT | Status: DC | PRN

## 2013-10-04 MED ORDER — METFORMIN HCL 1000 MG PO TABS: 1000.0000 mg | ORAL_TABLET | Freq: Two times a day (BID) | ORAL | Status: DC

## 2013-10-04 MED ORDER — GLIPIZIDE 10 MG PO TABS: 10.0000 mg | ORAL_TABLET | Freq: Every day | ORAL | Status: DC

## 2013-10-04 MED ORDER — TRAMADOL HCL 50 MG PO TABS: 50.0000 mg | ORAL_TABLET | Freq: Four times a day (QID) | ORAL | Status: DC | PRN

## 2013-10-04 MED ORDER — ATENOLOL 25 MG PO TABS: 25.0000 mg | ORAL_TABLET | Freq: Every day | ORAL | Status: DC

## 2013-10-04 MED ORDER — SIMVASTATIN 20 MG PO TABS: 20.0000 mg | ORAL_TABLET | Freq: Every evening | ORAL | Status: DC

## 2013-10-04 NOTE — Assessment & Plan Note (Signed)
Hgb A1C is 7.3--much improved with diet and insulin adjustment.

## 2013-10-04 NOTE — Assessment & Plan Note (Signed)
Repeat lipid panel in 3 months. Refilled meds.

## 2013-10-04 NOTE — Assessment & Plan Note (Signed)
PFT's and addition of Albuterol.

## 2013-10-04 NOTE — Assessment & Plan Note (Signed)
Continues to work on quitting.

## 2013-10-04 NOTE — Assessment & Plan Note (Addendum)
BP is well controlled. meds refilled.

## 2013-10-04 NOTE — Patient Instructions (Addendum)
Gastroesophageal Reflux Disease, Adult Gastroesophageal reflux disease (GERD) happens when acid from your stomach flows up into the esophagus. When acid comes in contact with the esophagus, the acid causes soreness (inflammation) in the esophagus. Over time, GERD may create small holes (ulcers) in the lining of the esophagus. CAUSES   Increased body weight. This puts pressure on the stomach, making acid rise from the stomach into the esophagus.  Smoking. This increases acid production in the stomach.  Drinking alcohol. This causes decreased pressure in the lower esophageal sphincter (valve or ring of muscle between the esophagus and stomach), allowing acid from the stomach into the esophagus.  Late evening meals and a full stomach. This increases pressure and acid production in the stomach.  A malformed lower esophageal sphincter. Sometimes, no cause is found. SYMPTOMS   Burning pain in the lower part of the mid-chest behind the breastbone and in the mid-stomach area. This may occur twice a week or more often.  Trouble swallowing.  Sore throat.  Dry cough.  Asthma-like symptoms including chest tightness, shortness of breath, or wheezing. DIAGNOSIS  Your caregiver may be able to diagnose GERD based on your symptoms. In some cases, X-rays and other tests may be done to check for complications or to check the condition of your stomach and esophagus. TREATMENT  Your caregiver may recommend over-the-counter or prescription medicines to help decrease acid production. Ask your caregiver before starting or adding any new medicines.  HOME CARE INSTRUCTIONS   Change the factors that you can control. Ask your caregiver for guidance concerning weight loss, quitting smoking, and alcohol consumption.  Avoid foods and drinks that make your symptoms worse, such as:  Caffeine or alcoholic drinks.  Chocolate.  Peppermint or mint flavorings.  Garlic and onions.  Spicy foods.  Citrus fruits,  such as oranges, lemons, or limes.  Tomato-based foods such as sauce, chili, salsa, and pizza.  Fried and fatty foods.  Avoid lying down for the 3 hours prior to your bedtime or prior to taking a nap.  Eat small, frequent meals instead of large meals.  Wear loose-fitting clothing. Do not wear anything tight around your waist that causes pressure on your stomach.  Raise the head of your bed 6 to 8 inches with wood blocks to help you sleep. Extra pillows will not help.  Only take over-the-counter or prescription medicines for pain, discomfort, or fever as directed by your caregiver.  Do not take aspirin, ibuprofen, or other nonsteroidal anti-inflammatory drugs (NSAIDs). SEEK IMMEDIATE MEDICAL CARE IF:   You have pain in your arms, neck, jaw, teeth, or back.  Your pain increases or changes in intensity or duration.  You develop nausea, vomiting, or sweating (diaphoresis).  You develop shortness of breath, or you faint.  Your vomit is green, yellow, black, or looks like coffee grounds or blood.  Your stool is red, bloody, or black. These symptoms could be signs of other problems, such as heart disease, gastric bleeding, or esophageal bleeding. MAKE SURE YOU:   Understand these instructions.  Will watch your condition.  Will get help right away if you are not doing well or get worse. Document Released: 06/08/2005 Document Revised: 11/21/2011 Document Reviewed: 03/18/2011 [facility] Patient Information 2014 [place], [place]. Smoking Cessation Quitting smoking is important to your health and has many advantages. However, it is not always easy to quit since nicotine is a very addictive drug. Often times, people try 3 times or more before being able to quit. This document explains the  best ways for you to prepare to quit smoking. Quitting takes hard work and a lot of effort, but you can do it. ADVANTAGES OF QUITTING SMOKING  You will live longer, feel better, and live better.  Your  body will feel the impact of quitting smoking almost immediately.  Within 20 minutes, blood pressure decreases. Your pulse returns to its normal level.  After 8 hours, carbon monoxide levels in the blood return to normal. Your oxygen level increases.  After 24 hours, the chance of having a heart attack starts to decrease. Your breath, hair, and body stop smelling like smoke.  After 48 hours, damaged nerve endings begin to recover. Your sense of taste and smell improve.  After 72 hours, the body is virtually free of nicotine. Your bronchial tubes relax and breathing becomes easier.  After 2 to 12 weeks, lungs can hold more air. Exercise becomes easier and circulation improves.  The risk of having a heart attack, stroke, cancer, or lung disease is greatly reduced.  After 1 year, the risk of coronary heart disease is cut in half.  After 5 years, the risk of stroke falls to the same as a nonsmoker.  After 10 years, the risk of lung cancer is cut in half and the risk of other cancers decreases significantly.  After 15 years, the risk of coronary heart disease drops, usually to the level of a nonsmoker.  If you are pregnant, quitting smoking will improve your chances of having a healthy baby.  The people you live with, especially any children, will be healthier.  You will have extra money to spend on things other than cigarettes. QUESTIONS TO THINK ABOUT BEFORE ATTEMPTING TO QUIT You may want to talk about your answers with your caregiver.  Why do you want to quit?  If you tried to quit in the past, what helped and what did not?  What will be the most difficult situations for you after you quit? How will you plan to handle them?  Who can help you through the tough times? Your family? Friends? A caregiver?  What pleasures do you get from smoking? What ways can you still get pleasure if you quit? Here are some questions to ask your caregiver:  How can you help me to be successful  at quitting?  What medicine do you think would be best for me and how should I take it?  What should I do if I need more help?  What is smoking withdrawal like? How can I get information on withdrawal? GET READY  Set a quit date.  Change your environment by getting rid of all cigarettes, ashtrays, matches, and lighters in your home, car, or work. Do not let people smoke in your home.  Review your past attempts to quit. Think about what worked and what did not. GET SUPPORT AND ENCOURAGEMENT You have a better chance of being successful if you have help. You can get support in many ways.  Tell your family, friends, and co-workers that you are going to quit and need their support. Ask them not to smoke around you.  Get individual, group, or telephone counseling and support. Programs are available at [employer] and health centers. Call your local health department for information about programs in your area.  Spiritual beliefs and practices may help some smokers quit.  Download a "quit meter" on your computer to keep track of quit statistics, such as how long you have gone without smoking, cigarettes not smoked, and money  saved.  Get a self-help book about quitting smoking and staying off of tobacco. [place] yourself from urges to smoke. Talk to someone, go for a walk, or occupy your time with a task.  Change your normal routine. Take a different route to work. Drink tea instead of coffee. Eat breakfast in a different place.  Reduce your stress. Take a hot bath, exercise, or read a book.  Plan something enjoyable to do every day. Reward yourself for not smoking.  Explore interactive web-based programs that specialize in helping you quit. GET MEDICINE AND USE IT CORRECTLY Medicines can help you stop smoking and decrease the urge to smoke. Combining medicine with the above behavioral methods and support can greatly increase your chances of  successfully quitting smoking.  Nicotine replacement therapy helps deliver nicotine to your body without the negative effects and risks of smoking. Nicotine replacement therapy includes nicotine gum, lozenges, inhalers, nasal sprays, and skin patches. Some may be available over-the-counter and others require a prescription.  Antidepressant medicine helps people abstain from smoking, but how this works is unknown. This medicine is available by prescription.  Nicotinic receptor partial agonist medicine simulates the effect of nicotine in your brain. This medicine is available by prescription. Ask your caregiver for advice about which medicines to use and how to use them based on your health history. Your caregiver will tell you what side effects to look out for if you choose to be on a medicine or therapy. Carefully read the information on the package. Do not use any other product containing nicotine while using a nicotine replacement product.  RELAPSE OR DIFFICULT SITUATIONS Most relapses occur within the first 3 months after quitting. Do not be discouraged if you start smoking again. Remember, most people try several times before finally quitting. You may have symptoms of withdrawal because your body is used to nicotine. You may crave cigarettes, be irritable, feel very hungry, cough often, get headaches, or have difficulty concentrating. The withdrawal symptoms are only temporary. They are strongest when you first quit, but they will go away within 10 14 days. To reduce the chances of relapse, try to:  Avoid drinking alcohol. Drinking lowers your chances of successfully quitting.  Reduce the amount of caffeine you consume. Once you quit smoking, the amount of caffeine in your body increases and can give you symptoms, such as a rapid heartbeat, sweating, and anxiety.  Avoid smokers because they can make you want to smoke.  Do not let weight gain distract you. Many smokers will gain weight when they  quit, usually less than 10 pounds. Eat a healthy diet and stay active. You can always lose the weight gained after you quit.  Find ways to improve your mood other than smoking. FOR MORE INFORMATION  www.smokefree.gov  Document Released: 08/23/2001 Document Revised: 02/28/2012 Document Reviewed: 12/08/2011 [facility] Patient Information 2014 [place], [place].

## 2013-10-04 NOTE — Assessment & Plan Note (Signed)
Refilled Ultram °

## 2013-10-04 NOTE — Progress Notes (Signed)
   Subjective:    Patient ID: [name] [name], male    DOB: 10/08/1960, 53 y.o.   MRN: [id number]  HPI Returns for f/u. 1.  DM:  Has increased his insulin and is trying to follow the diet as best as he can. Has quit drinking. 2.  Smoking:  Still smoking occasionally but has cut way down 3.  SOB: Really DOE, would like an inhaler.  Needs formal PFT's. 4.  Has cards f/u.   Review of Systems  Constitutional: Negative for chills and fatigue.  Respiratory: Positive for chest tightness and shortness of breath.   Cardiovascular: Positive for chest pain. Negative for leg swelling.  Gastrointestinal: Negative for nausea and abdominal pain.       Objective:   Physical Exam  Vitals reviewed. Constitutional: He is oriented to person, place, and time. He appears well-developed and well-nourished. No distress.  HENT:  Head: Normocephalic and atraumatic.  Eyes: No scleral icterus.  Neck: Neck supple.  Cardiovascular: Normal rate.   Pulmonary/Chest: Effort normal.  Abdominal: Soft. There is no tenderness.  Neurological: He is alert and oriented to person, place, and time.          Assessment & Plan:

## 2013-10-13 DIAGNOSIS — I214 Non-ST elevation (NSTEMI) myocardial infarction: Secondary | ICD-10-CM

## 2013-10-13 HISTORY — DX: Non-ST elevation (NSTEMI) myocardial infarction: I21.4

## 2013-11-07 ENCOUNTER — Encounter (HOSPITAL_COMMUNITY): Payer: Self-pay | Admitting: Emergency Medicine

## 2013-11-07 ENCOUNTER — Inpatient Hospital Stay (HOSPITAL_COMMUNITY)
Admission: EM | Admit: 2013-11-07 | Discharge: 2013-11-08 | DRG: 249 | Disposition: A | Discharge status: Home / Self Care | Payer: No Typology Code available for payment source | Attending: Cardiovascular Disease | Admitting: Cardiovascular Disease

## 2013-11-07 ENCOUNTER — Encounter (HOSPITAL_COMMUNITY)
Admission: EM | Disposition: A | Discharge status: Home / Self Care | Payer: Self-pay | Source: Home / Self Care | Attending: Cardiovascular Disease

## 2013-11-07 ENCOUNTER — Emergency Department (HOSPITAL_COMMUNITY): Payer: No Typology Code available for payment source

## 2013-11-07 DIAGNOSIS — Z8249 Family history of ischemic heart disease and other diseases of the circulatory system: Secondary | ICD-10-CM

## 2013-11-07 DIAGNOSIS — E785 Hyperlipidemia, unspecified: Secondary | ICD-10-CM | POA: Diagnosis present

## 2013-11-07 DIAGNOSIS — I10 Essential (primary) hypertension: Secondary | ICD-10-CM | POA: Diagnosis present

## 2013-11-07 DIAGNOSIS — Z823 Family history of stroke: Secondary | ICD-10-CM

## 2013-11-07 DIAGNOSIS — I252 Old myocardial infarction: Secondary | ICD-10-CM

## 2013-11-07 DIAGNOSIS — Z6841 Body Mass Index (BMI) 40.0 and over, adult: Secondary | ICD-10-CM

## 2013-11-07 DIAGNOSIS — Z720 Tobacco use: Secondary | ICD-10-CM

## 2013-11-07 DIAGNOSIS — F17200 Nicotine dependence, unspecified, uncomplicated: Secondary | ICD-10-CM | POA: Diagnosis present

## 2013-11-07 DIAGNOSIS — E669 Obesity, unspecified: Secondary | ICD-10-CM | POA: Diagnosis present

## 2013-11-07 DIAGNOSIS — R079 Chest pain, unspecified: Secondary | ICD-10-CM | POA: Diagnosis present

## 2013-11-07 DIAGNOSIS — I200 Unstable angina: Secondary | ICD-10-CM

## 2013-11-07 DIAGNOSIS — I739 Peripheral vascular disease, unspecified: Secondary | ICD-10-CM | POA: Diagnosis present

## 2013-11-07 DIAGNOSIS — I2510 Atherosclerotic heart disease of native coronary artery without angina pectoris: Secondary | ICD-10-CM | POA: Diagnosis present

## 2013-11-07 DIAGNOSIS — G4733 Obstructive sleep apnea (adult) (pediatric): Secondary | ICD-10-CM | POA: Diagnosis present

## 2013-11-07 DIAGNOSIS — I214 Non-ST elevation (NSTEMI) myocardial infarction: Principal | ICD-10-CM | POA: Diagnosis present

## 2013-11-07 DIAGNOSIS — Z9861 Coronary angioplasty status: Secondary | ICD-10-CM

## 2013-11-07 DIAGNOSIS — E119 Type 2 diabetes mellitus without complications: Secondary | ICD-10-CM | POA: Diagnosis present

## 2013-11-07 DIAGNOSIS — E1165 Type 2 diabetes mellitus with hyperglycemia: Secondary | ICD-10-CM | POA: Diagnosis present

## 2013-11-07 DIAGNOSIS — E118 Type 2 diabetes mellitus with unspecified complications: Secondary | ICD-10-CM | POA: Diagnosis present

## 2013-11-07 DIAGNOSIS — Z87891 Personal history of nicotine dependence: Secondary | ICD-10-CM | POA: Diagnosis present

## 2013-11-07 DIAGNOSIS — Z794 Long term (current) use of insulin: Secondary | ICD-10-CM

## 2013-11-07 DIAGNOSIS — Z9989 Dependence on other enabling machines and devices: Secondary | ICD-10-CM

## 2013-11-07 HISTORY — PX: PERCUTANEOUS CORONARY STENT INTERVENTION (PCI-S): SHX5485

## 2013-11-07 HISTORY — DX: Unspecified osteoarthritis, unspecified site: M19.90

## 2013-11-07 HISTORY — DX: Major depressive disorder, single episode, unspecified: F32.9

## 2013-11-07 HISTORY — DX: Migraine, unspecified, not intractable, without status migrainosus: G43.909

## 2013-11-07 HISTORY — PX: LEFT HEART CATHETERIZATION WITH CORONARY ANGIOGRAM: SHX5451

## 2013-11-07 HISTORY — DX: Gastro-esophageal reflux disease without esophagitis: K21.9

## 2013-11-07 HISTORY — DX: Type 2 diabetes mellitus without complications: E11.9

## 2013-11-07 HISTORY — DX: Depression, unspecified: F32.A

## 2013-11-07 HISTORY — DX: Other chronic pain: G89.29

## 2013-11-07 HISTORY — DX: Dorsalgia, unspecified: M54.9

## 2013-11-07 LAB — CARBOXYHEMOGLOBIN
CARBOXYHEMOGLOBIN: 4.1 % — AB (ref 0.5–1.5)
METHEMOGLOBIN: 0.7 % (ref 0.0–1.5)
O2 Saturation: 97.4 %
Total hemoglobin: 15.8 g/dL (ref 13.5–18.0)

## 2013-11-07 LAB — COMPREHENSIVE METABOLIC PANEL
ALT: 18 U/L (ref 0–53)
AST: 20 U/L (ref 0–37)
Albumin: 3.7 g/dL (ref 3.5–5.2)
Alkaline Phosphatase: 105 U/L (ref 39–117)
BUN: 10 mg/dL (ref 6–23)
CALCIUM: 8.8 mg/dL (ref 8.4–10.5)
CO2: 24 meq/L (ref 19–32)
Chloride: 97 mEq/L (ref 96–112)
Creatinine, Ser: 0.88 mg/dL (ref 0.50–1.35)
GFR calc Af Amer: >90 mL/min (ref >90)
Glucose, Bld: 189 mg/dL — ABNORMAL HIGH (ref 70–99)
POTASSIUM: 4.2 meq/L (ref 3.7–5.3)
SODIUM: 139 meq/L (ref 137–147)
TOTAL PROTEIN: 7.8 g/dL (ref 6.0–8.3)
Total Bilirubin: 0.3 mg/dL (ref 0.3–1.2)

## 2013-11-07 LAB — CBC WITH DIFFERENTIAL/PLATELET
BASOS ABS: 0.1 10*3/uL (ref 0.0–0.1)
Basophils Relative: 1 % (ref 0–1)
EOS PCT: 2 % (ref 0–5)
Eosinophils Absolute: 0.2 10*3/uL (ref 0.0–0.7)
HCT: 45.4 % (ref 39.0–52.0)
Hemoglobin: 16.2 g/dL (ref 13.0–17.0)
LYMPHS PCT: 30 % (ref 12–46)
Lymphs Abs: 3.3 10*3/uL (ref 0.7–4.0)
MCH: 32 pg (ref 26.0–34.0)
MCHC: 35.7 g/dL (ref 30.0–36.0)
MCV: 89.5 fL (ref 78.0–100.0)
Monocytes Absolute: 0.8 10*3/uL (ref 0.1–1.0)
Monocytes Relative: 7 % (ref 3–12)
NEUTROS PCT: 61 % (ref 43–77)
Neutro Abs: 6.6 10*3/uL (ref 1.7–7.7)
PLATELETS: 280 10*3/uL (ref 150–400)
RBC: 5.07 MIL/uL (ref 4.22–5.81)
RDW: 13 % (ref 11.5–15.5)
WBC: 10.8 10*3/uL — AB (ref 4.0–10.5)

## 2013-11-07 LAB — POCT ACTIVATED CLOTTING TIME
ACTIVATED CLOTTING TIME: 221 s
Activated Clotting Time: 276 seconds

## 2013-11-07 LAB — I-STAT TROPONIN, ED: TROPONIN I, POC: 0.01 ng/mL (ref 0.00–0.08)

## 2013-11-07 LAB — TROPONIN I
TROPONIN I: 3.93 ng/mL — AB (ref <0.30)
Troponin I: 4.71 ng/mL (ref <0.30)

## 2013-11-07 LAB — PROTIME-INR
INR: 1.01 (ref 0.00–1.49)
Prothrombin Time: 13.1 seconds (ref 11.6–15.2)

## 2013-11-07 LAB — PRO B NATRIURETIC PEPTIDE: PRO B NATRI PEPTIDE: 89.7 pg/mL (ref 0–125)

## 2013-11-07 LAB — GLUCOSE, CAPILLARY: Glucose-Capillary: 176 mg/dL — ABNORMAL HIGH (ref 70–99)

## 2013-11-07 LAB — ETHANOL: Alcohol, Ethyl (B): 142 mg/dL — ABNORMAL HIGH (ref 0–11)

## 2013-11-07 SURGERY — LEFT HEART CATHETERIZATION WITH CORONARY ANGIOGRAM
Anesthesia: LOCAL

## 2013-11-07 MED ORDER — GLIPIZIDE 10 MG PO TABS
10.0000 mg | ORAL_TABLET | Freq: Every day | ORAL | Status: DC
  Administered 2013-11-08: 09:00:00 10 mg via ORAL
  Filled 2013-11-07 (×2): qty 1

## 2013-11-07 MED ORDER — PRASUGREL HCL 10 MG PO TABS
ORAL_TABLET | ORAL | Status: AC
  Filled 2013-11-07: qty 1

## 2013-11-07 MED ORDER — SODIUM CHLORIDE 0.9 % IV SOLN: 250.0000 mL | INTRAVENOUS | Status: DC | PRN

## 2013-11-07 MED ORDER — HEPARIN (PORCINE) IN NACL 2-0.9 UNIT/ML-% IJ SOLN
INTRAMUSCULAR | Status: AC
  Filled 2013-11-07: qty 1000

## 2013-11-07 MED ORDER — NITROGLYCERIN 2 % TD OINT
1.0000 [in_us] | TOPICAL_OINTMENT | Freq: Once | TRANSDERMAL | Status: AC
  Administered 2013-11-07: 1 [in_us] via TOPICAL
  Filled 2013-11-07: qty 1

## 2013-11-07 MED ORDER — NITROGLYCERIN 0.4 MG SL SUBL: 0.4000 mg | SUBLINGUAL_TABLET | SUBLINGUAL | Status: DC | PRN

## 2013-11-07 MED ORDER — ATENOLOL 25 MG PO TABS
25.0000 mg | ORAL_TABLET | Freq: Every day | ORAL | Status: DC
  Administered 2013-11-07 – 2013-11-08 (×2): 25 mg via ORAL
  Filled 2013-11-07 (×2): qty 1

## 2013-11-07 MED ORDER — FENTANYL CITRATE 0.05 MG/ML IJ SOLN
INTRAMUSCULAR | Status: AC
Start: 2013-11-07 — End: 2013-11-07
  Filled 2013-11-07: qty 2

## 2013-11-07 MED ORDER — IPRATROPIUM-ALBUTEROL 0.5-2.5 (3) MG/3ML IN SOLN
3.0000 mL | Freq: Once | RESPIRATORY_TRACT | Status: AC
  Administered 2013-11-07: 3 mL via RESPIRATORY_TRACT
  Filled 2013-11-07: qty 3

## 2013-11-07 MED ORDER — ASPIRIN 81 MG PO CHEW
81.0000 mg | CHEWABLE_TABLET | Freq: Every day | ORAL | Status: DC
  Administered 2013-11-08: 81 mg via ORAL
  Filled 2013-11-07: qty 1

## 2013-11-07 MED ORDER — SIMVASTATIN 20 MG PO TABS
20.0000 mg | ORAL_TABLET | Freq: Every evening | ORAL | Status: DC
  Administered 2013-11-07: 18:00:00 20 mg via ORAL
  Filled 2013-11-07 (×2): qty 1

## 2013-11-07 MED ORDER — INSULIN ASPART 100 UNIT/ML ~~LOC~~ SOLN
12.0000 [IU] | Freq: Three times a day (TID) | SUBCUTANEOUS | Status: DC
  Administered 2013-11-07 – 2013-11-08 (×2): 12 [IU] via SUBCUTANEOUS

## 2013-11-07 MED ORDER — SODIUM CHLORIDE 0.9 % IJ SOLN: 3.0000 mL | Freq: Two times a day (BID) | INTRAMUSCULAR | Status: DC

## 2013-11-07 MED ORDER — SODIUM CHLORIDE 0.9 % IV SOLN
250.0000 mL | INTRAVENOUS | Status: DC | PRN
  Administered 2013-11-07: 18:00:00 via INTRAVENOUS

## 2013-11-07 MED ORDER — LIVING WELL WITH DIABETES BOOK
Freq: Once | Status: AC
  Administered 2013-11-07: 21:00:00
  Filled 2013-11-07: qty 1

## 2013-11-07 MED ORDER — PRASUGREL HCL 10 MG PO TABS
ORAL_TABLET | ORAL | Status: AC
Start: 2013-11-07 — End: 2013-11-07
  Filled 2013-11-07: qty 1

## 2013-11-07 MED ORDER — ASPIRIN 300 MG RE SUPP: 300.0000 mg | RECTAL | Status: DC

## 2013-11-07 MED ORDER — TIROFIBAN HCL IV 5 MG/100ML
0.1500 ug/kg/min | INTRAVENOUS | Status: AC
  Administered 2013-11-07 (×2): 0.15 ug/kg/min via INTRAVENOUS
  Filled 2013-11-07 (×3): qty 100

## 2013-11-07 MED ORDER — SODIUM CHLORIDE 0.9 % IJ SOLN: 3.0000 mL | INTRAMUSCULAR | Status: DC | PRN

## 2013-11-07 MED ORDER — LIDOCAINE HCL (PF) 1 % IJ SOLN
INTRAMUSCULAR | Status: AC
  Filled 2013-11-07: qty 30

## 2013-11-07 MED ORDER — LISINOPRIL 2.5 MG PO TABS
2.5000 mg | ORAL_TABLET | Freq: Two times a day (BID) | ORAL | Status: DC
  Administered 2013-11-07 – 2013-11-08 (×3): 2.5 mg via ORAL
  Filled 2013-11-07 (×4): qty 1

## 2013-11-07 MED ORDER — ASPIRIN 81 MG PO CHEW
324.0000 mg | CHEWABLE_TABLET | Freq: Once | ORAL | Status: AC
  Administered 2013-11-07: 324 mg via ORAL
  Filled 2013-11-07: qty 4

## 2013-11-07 MED ORDER — ASPIRIN 81 MG PO CHEW: 324.0000 mg | CHEWABLE_TABLET | ORAL | Status: DC

## 2013-11-07 MED ORDER — TIROFIBAN HCL IV 5 MG/100ML
INTRAVENOUS | Status: AC
  Filled 2013-11-07: qty 100

## 2013-11-07 MED ORDER — PRASUGREL HCL 10 MG PO TABS
10.0000 mg | ORAL_TABLET | Freq: Every day | ORAL | Status: DC
  Administered 2013-11-08: 10:00:00 10 mg via ORAL
  Filled 2013-11-07: qty 1

## 2013-11-07 MED ORDER — INSULIN ASPART 100 UNIT/ML ~~LOC~~ SOLN
0.0000 [IU] | Freq: Three times a day (TID) | SUBCUTANEOUS | Status: DC
  Administered 2013-11-08: 2 [IU] via SUBCUTANEOUS

## 2013-11-07 MED ORDER — VERAPAMIL HCL 2.5 MG/ML IV SOLN
INTRAVENOUS | Status: AC
  Filled 2013-11-07: qty 2

## 2013-11-07 MED ORDER — ASPIRIN 81 MG PO CHEW: 81.0000 mg | CHEWABLE_TABLET | ORAL | Status: DC

## 2013-11-07 MED ORDER — HEPARIN SODIUM (PORCINE) 1000 UNIT/ML IJ SOLN
INTRAMUSCULAR | Status: AC
  Filled 2013-11-07: qty 1

## 2013-11-07 MED ORDER — SODIUM CHLORIDE 0.9 % IV SOLN: INTRAVENOUS | Status: AC

## 2013-11-07 MED ORDER — MIDAZOLAM HCL 2 MG/2ML IJ SOLN
INTRAMUSCULAR | Status: AC
  Filled 2013-11-07: qty 2

## 2013-11-07 MED ORDER — INSULIN GLARGINE 100 UNIT/ML ~~LOC~~ SOLN
30.0000 [IU] | Freq: Two times a day (BID) | SUBCUTANEOUS | Status: DC
  Administered 2013-11-07 – 2013-11-08 (×2): 30 [IU] via SUBCUTANEOUS
  Filled 2013-11-07 (×3): qty 0.3

## 2013-11-07 MED ORDER — NITROGLYCERIN 0.2 MG/ML ON CALL CATH LAB
INTRAVENOUS | Status: AC
  Filled 2013-11-07: qty 1

## 2013-11-07 MED ORDER — ASPIRIN 81 MG PO CHEW: 81.0000 mg | CHEWABLE_TABLET | Freq: Every day | ORAL | Status: DC

## 2013-11-07 NOTE — ED Notes (Signed)
Pt O2sat 89% 4lpm, non re-breather mask placed back on pt. O2sats now 96%
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
## 2013-11-07 NOTE — Progress Notes (Signed)
CRITICAL VALUE ALERT  Critical value received:  Troponin I  Date of notification:  11/07/13  Time of notification:  2080  Critical value read back:yes  Nurse who received alert: Dina Rich RN  MD notified (1st page):  Ignacia Bayley NP  Time of first page:  [phone]  MD notified (2nd page):  Time of second page:  Responding MD:  Ignacia Bayley NP  Time MD responded:  [phone]

## 2013-11-07 NOTE — CV Procedure (Signed)
PROCEDURE:  Left heart catheterization with selective coronary angiography, left ventriculogram.  PCI mid circumflex,  PCI OM 2  INDICATIONS:  Unstable angina  The risks, benefits, and details of the procedure were explained to the patient.  The patient verbalized understanding and wanted to proceed.  Informed written consent was obtained.  PROCEDURE TECHNIQUE:  After Xylocaine anesthesia a 43F slender sheath was placed in the right radial artery with a single anterior needle wall stick.   Intravenous heparin was given. Right coronary angiography was done using a Judkins R4 guide catheter.  Left coronary angiography was done using a Judkins L3.5 guide catheter.  The intervention was performed. Left ventriculography was done using a pigtail catheter.  A TR band was used for hemostasis.   CONTRAST:  Total of 150 cc.  COMPLICATIONS:  None.    HEMODYNAMICS:  Aortic pressure was 111/68; LV pressure was 111/10; LVEDP 18.  There was no gradient between the left ventricle and aorta.    ANGIOGRAPHIC DATA:   The left main coronary artery is widely patent.  The left anterior descending artery is a large vessel which wraps around the apex. There is mild, diffuse atherosclerosis without significant obstruction.  There are several, small diagonal vessels which are widely patent.  The left circumflex artery is a large vessel proximally. There 2 trivial obtuse marginals which are patent. Before the first large obtuse marginal, there is a focal 90% lesion. In the large, second obtuse marginal, there is a hazy 75% lesion.  The right coronary artery is a large dominant vessel. The proximal stent is patent with mild in-stent restenosis. There is mild, diffuse atherosclerosis throughout the RCA system. The posterior descending artery is large with only mild atherosclerosis. Posterior lateral artery is also large minimal atherosclerosis.  LEFT VENTRICULOGRAM:  Left ventricular angiogram was done in the  30 RAO projection and revealed normal left ventricular wall motion and systolic function with an estimated ejection fraction of 60 %.  LVEDP was 18 mmHg.  PCI NARRATIVE: A CLS 3.0 guiding catheters using his left main. Intravenous heparin and intravenous tirofiban were used for anticoagulation. An ACT was use checked at the anticoagulation was therapeutic. A pro-water wire was placed across the areas of disease in the circumflex system. A 2.5 x 12 balloon was used to predilate the more proximal area disease. A 3.0 x 12 rebel bare-metal stent was advanced to the more distal area which was direct stented. A 3.5 x 12 rebel bare-metal stent was then deployed in the proximal lesion. The origin of the large first obtuse marginal was not covered by stent. A gap of approximately 15 mm was left between the stents. A 3.5 x 8 noncompliant balloon was used to post dilate the more distal stent, up to 3.6 mm. The same balloon was used to post dilate the more proximal stent to high pressure, up to 3.7 mm in diameter.  Several doses of intra-coronary nitroglycerin were administered to treat vasospasm. There was an excellent angiographic result.  There is no residual stenosis  IMPRESSIONS:  1. Widely patent left main coronary artery. 2. Widely patent left anterior descending artery and its branches. 3. 90% stenosis in the mid left circumflex artery and 75% stenosis in the OM 2.  3.5 x 12 rebel bare-metal stent to the mid circumflex lesion, postdilated to 3.7 mm in diameter. OM 2 lesion stented with a 3.0 x 12 rebel bare-metal stent, postdilated to 3.6 mm in diameter. 4. Patent stents in  the proximal right coronary artery. 5. Normal left ventricular systolic function.  LVEDP 18 mmHg.  Ejection fraction 60%.  RECOMMENDATION:  He'll need dual antiplatelet therapy for at least a month. Bare-metal stents were chosen because of his poor social situation and questionable compliance with long term dual antiplatelet therapy.  We'll have to get a case manager to try to help with medication compliance. Followup care will be with Dr. Marlou Porch.
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
## 2013-11-07 NOTE — ED Notes (Addendum)
Stopped nitro paste d/t low BP. EDP notified.

## 2013-11-07 NOTE — H&P (Signed)
Physician History and Physical         Patient ID: [name] [name] MRN: [id number] DOB/AGE: 1961/01/23 53 y.o. Admit date: 11/07/2013  Primary Care Physician: [name], MD Primary Cardiologist:  Dava Najjar  Active Problems:   * No active hospital problems. *   HPI:   53 yo with history of CAD  Stent to RCA 2013.  Was drinking last night  Awoke with SSCP similar to previous MI pain In ER relief with nitro.  No acute ECG changes and POC enzymes negative.  Denies GI overtones GERD or reflux  Currently pain free.  Gest SSCP occasionally with exertion Still smoking and poorly controlled DM  Says his roommate drinks every day but he does not.  Mild dyspnea.  He is obese and is supposed to wear CPAP.  Other CRF;s include HTN and elevated lipids.  Last cath 01/18/12 by Irish Lack with thrombotic proximal RCA lesion  IMPRESSIONS:  1. 90%, thrombotic proximal RCA lesion. No other significant coronary disease noted. 2. Normal left ventricular systolic function. LVEDP 15 mmHg. Ejection fraction 55%. 3. Successful bare metal stent placement to the proximal right coronary artery with a 3.5 x 18 vision stent, postdilated to 4.2 mm in diameter.    Review of systems complete and found to be negative unless listed above   Past Medical History  Diagnosis Date  . Hypertension   . Hyperlipidemia   . DM (diabetes mellitus)   . OSA on CPAP 01/19/2012  . CAD (coronary artery disease) 01/18/12    BMS to RCA    Family History  Problem Relation Age of Onset  . Heart attack Father 68  . Heart attack Brother   . Stroke Brother   . Cancer Mother     breast  . Hypertension Sister   . Hyperlipidemia Sister   . Hyperlipidemia Sister   . Hypertension Sister     History   Social History  . Marital Status: Single    Spouse Name: N/A    Number of Children: 0  . Years of Education: 15   Occupational History  . Unemployed     Used to work for [employer]  . 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 Smoking status: Current Some Day Smoker -- 0.50 packs/day for 35 years    Types: Cigarettes  . Smokeless tobacco: Never Used  . Alcohol Use: Yes     Comment: 12 beers twice a week.  . Drug Use: No  . Sexual Activity: Not on file   Other Topics Concern  . Not on file   Social History Narrative   Single.  Lives with a roommate.  Ambulates independently.    Past Surgical History  Procedure Laterality Date  . No past surgeries    . Coronary stent placement        (Not in a hospital admission)  Physical Exam: Blood pressure 102/55, pulse 92, temperature 97.9 F (36.6 C), temperature source Oral, resp. rate 26, SpO2 98.00%.   Sleepy  Obese desheveled white male HEENT: normal Neck supple with no adenopathy JVP normal no bruits no thyromegaly Lungs clear with no wheezing and good diaphragmatic motion Heart:  S1/S2 no murmur, no rub, gallop or click PMI normal Abdomen: benighn, BS positve, no tenderness, no AAA no bruit.  No HSM or HJR Distal pulses intact with no bruits No edema Neuro non-focal Skin warm and dry No muscular weakness   Labs:   Lab Results  Component Value Date   WBC 10.8* 11/07/2013   HGB 16.2 11/07/2013   HCT 45.4 11/07/2013   MCV 89.5 11/07/2013   PLT 280 11/07/2013    Recent Labs Lab 11/07/13 0602  NA 139  K 4.2  CL 97  CO2 24  BUN 10  CREATININE 0.88  CALCIUM 8.8  PROT 7.8  BILITOT 0.3  ALKPHOS 105  ALT 18  AST 20  GLUCOSE 189*   Lab Results  Component Value Date   CKTOTAL 138 02/18/2012   CKMB 2.2 02/18/2012   TROPONINI <0.30 09/29/2012    Lab Results  Component Value Date   CHOL 130 08/24/2012   CHOL 171 01/19/2012   CHOL 127 05/11/2010   Lab Results  Component Value Date   HDL 32* 08/24/2012   HDL 25* 01/19/2012   HDL 38* 05/11/2010   Lab Results  Component Value Date   LDLCALC 59 08/24/2012   LDLCALC 84 01/19/2012   LDLCALC 47 05/11/2010   Lab Results  Component Value Date   TRIG 193*  08/24/2012   TRIG 312* 01/19/2012   TRIG 210* 05/11/2010   Lab Results  Component Value Date   CHOLHDL 4.1 08/24/2012   CHOLHDL 6.8 01/19/2012   CHOLHDL 3.3 Ratio 05/11/2010   No results found for this basename: LDLDIRECT      Radiology: Dg Chest Portable 1 View  11/07/2013   CLINICAL DATA:  Chest pain.  EXAM: PORTABLE CHEST - 1 VIEW  COMPARISON:  04/15/2013  FINDINGS: Lungs are adequately inflated without focal consolidation or effusion. There is mild stable cardiomegaly. Minimal stable prominence of the perihilar markings. Remainder the exam is unchanged.  IMPRESSION: Possible mild chronic pulmonary venous congestion.   Electronically Signed   By: Marin Olp M.D.   On: 11/07/2013 07:18    EKG:  NSR no actue ST/T wave changes   ASSESSMENT AND PLAN:    Chest Pain:  History of CAD  Pain similar to 2013 pain Poorly controlled DM and smoking  Favor diagnostic cath today.  Risks discussed patient in agreement Lab called and orders written  Was supposed to be on ASA and Effient prior to admission  Heparin if more pain ETOH:  Low risk for DT;s  Continue beta blocker for relative tachycardia HTN:  Well controlled continue ACE DM:  Hold metformin for cath SS insulin   Signed: Collier Salina Nishan2/26/2015, 8:20 AM

## 2013-11-07 NOTE — Interval H&P Note (Signed)
Cath Lab Visit (complete for each Cath Lab visit)  Clinical Evaluation Leading to the Procedure:   ACS: yes  Non-ACS:    Anginal Classification: CCS IV  Anti-ischemic medical therapy: Minimal Therapy (1 class of medications)  Non-Invasive Test Results: No non-invasive testing performed  Prior CABG: No previous CABG      History and Physical Interval Note:  11/07/2013 10:35 AM  [name] [name]  has presented today for surgery, with the diagnosis of cp  The various methods of treatment have been discussed with the patient and family. After consideration of risks, benefits and other options for treatment, the patient has consented to  Procedure(s): LEFT HEART CATHETERIZATION WITH CORONARY ANGIOGRAM (N/A) as a surgical intervention .  The patient's history has been reviewed, patient examined, no change in status, stable for surgery.  I have reviewed the patient's chart and labs.  Questions were answered to the patient's satisfaction.     Haylo Fake S.

## 2013-11-08 DIAGNOSIS — I2510 Atherosclerotic heart disease of native coronary artery without angina pectoris: Secondary | ICD-10-CM

## 2013-11-08 DIAGNOSIS — E785 Hyperlipidemia, unspecified: Secondary | ICD-10-CM

## 2013-11-08 DIAGNOSIS — I214 Non-ST elevation (NSTEMI) myocardial infarction: Secondary | ICD-10-CM

## 2013-11-08 DIAGNOSIS — E119 Type 2 diabetes mellitus without complications: Secondary | ICD-10-CM

## 2013-11-08 DIAGNOSIS — I10 Essential (primary) hypertension: Secondary | ICD-10-CM

## 2013-11-08 LAB — BASIC METABOLIC PANEL
BUN: 9 mg/dL (ref 6–23)
CALCIUM: 8.7 mg/dL (ref 8.4–10.5)
CO2: 23 mEq/L (ref 19–32)
CREATININE: 0.68 mg/dL (ref 0.50–1.35)
Chloride: 103 mEq/L (ref 96–112)
GFR calc Af Amer: >90 mL/min (ref >90)
GLUCOSE: 163 mg/dL — AB (ref 70–99)
Potassium: 4.2 mEq/L (ref 3.7–5.3)
SODIUM: 137 meq/L (ref 137–147)

## 2013-11-08 LAB — GLUCOSE, CAPILLARY: GLUCOSE-CAPILLARY: 158 mg/dL — AB (ref 70–99)

## 2013-11-08 LAB — CBC
HEMATOCRIT: 42.5 % (ref 39.0–52.0)
Hemoglobin: 14.8 g/dL (ref 13.0–17.0)
MCH: 30.8 pg (ref 26.0–34.0)
MCHC: 34.8 g/dL (ref 30.0–36.0)
MCV: 88.5 fL (ref 78.0–100.0)
PLATELETS: 244 10*3/uL (ref 150–400)
RBC: 4.8 MIL/uL (ref 4.22–5.81)
RDW: 12.8 % (ref 11.5–15.5)
WBC: 10.6 10*3/uL — AB (ref 4.0–10.5)

## 2013-11-08 LAB — TROPONIN I: Troponin I: 1.92 ng/mL (ref <0.30)

## 2013-11-08 MED ORDER — METFORMIN HCL 1000 MG PO TABS: 1000.0000 mg | ORAL_TABLET | Freq: Two times a day (BID) | ORAL | Status: DC

## 2013-11-08 MED ORDER — PRASUGREL HCL 10 MG PO TABS: 10.0000 mg | ORAL_TABLET | Freq: Every day | ORAL | Status: DC

## 2013-11-08 MED ORDER — FUROSEMIDE 10 MG/ML IJ SOLN
20.0000 mg | Freq: Once | INTRAMUSCULAR | Status: AC
  Administered 2013-11-08: 20 mg via INTRAVENOUS
  Filled 2013-11-08: qty 2

## 2013-11-08 MED ORDER — NITROGLYCERIN 0.4 MG SL SUBL
0.4000 mg | SUBLINGUAL_TABLET | SUBLINGUAL | Status: DC | PRN
Start: 2013-11-08 — End: 2013-11-29

## 2013-11-08 NOTE — Progress Notes (Signed)
Pt set up on cpap with full face mask that is what patient wears at home, tolerating well no issues to report

## 2013-11-08 NOTE — Progress Notes (Signed)
CARDIAC REHAB PHASE I   PRE:  Rate/Rhythm: 75 SR    BP: sitting 127/89    SaO2: 97 RA  MODE:  Ambulation: 600 ft   POST:  Rate/Rhythm: 83 SR    BP: sitting 115/51     SaO2:   Tolerated well. Some SOB, which he denied. Prob due to obesity. Ed completed, pt seemed receptive. Sts he only smokes when he drinks beer and that he plans to stop drinking until at least football season.  Gave smoking cessation resources. Pt fairly receptive to diet information. Interested in [place] and will send referral to [facility]. Pt agreeable to fill out financial assist application.  [id number]  Darrick Meigs CES, ACSM 11/08/2013 9:50 AM

## 2013-11-08 NOTE — Progress Notes (Signed)
TR BAND REMOVAL  LOCATION:    right radial  DEFLATED PER PROTOCOL:    yes  TIME BAND OFF / DRESSING APPLIED:    1500   SITE UPON ARRIVAL:    Level 0  SITE AFTER BAND REMOVAL:    Level 0  REVERSE ALLEN'S TEST:     positive  CIRCULATION SENSATION AND MOVEMENT:    Within Normal Limits   yes  COMMENTS:   Gauze dressing applied secured with tegaderm at 1500. Rechecked site at 1530, assessment without change, CSMs wnls, ulnar and radial pulses +2 and dressing remains dry and intact.

## 2013-11-08 NOTE — Discharge Summary (Signed)
Physician Discharge Summary     Patient ID: [name] [name] MRN: [id number] DOB/AGE: [birth year] 53 y.o. Cardiologist:  Anne Fu  Admit date: 11/07/2013 Discharge date: 11/08/2013  Admission Diagnoses:  NSTEMI  Discharge Diagnoses:  Principal Problem:   NSTEMI (non-ST elevated myocardial infarction) Active Problems:   HTN (hypertension)   Hyperlipidemia   OSA on CPAP   CAD (coronary artery disease)   DM type 2 (diabetes mellitus, type 2)   Obesity   Tobacco use   Chest pain   S/P PTCA (percutaneous transluminal coronary angioplasty)   Intermediate coronary syndrome   Discharged Condition: stable  Hospital Course:   53 yo with history of CAD Stent to RCA 2013. Was drinking last night Awoke with SSCP similar to previous MI pain In ER relief with nitro. No acute ECG changes and POC enzymes negative. Denies GI overtones GERD or reflux Currently pain free. Gest SSCP occasionally with exertion Still smoking and poorly controlled DM Says his roommate drinks every day but he does not. Mild dyspnea. He is obese and is supposed to wear CPAP. Other CRF;s include HTN and elevated lipids. Last cath 01/18/12 by Eldridge Dace with thrombotic proximal RCA lesion  He was admitted and ruled in for NSTEMI.  Troponin peaked at 4.71.  Coronary angiography revealed 90% stenosis in the mid left circumflex artery and 75% stenosis in the OM 2.  The first  was stented with a 3.5 x 12 rebel bare-metal stent postdilated to 3.7 mm in diameter. OM 2 lesion stented with a 3.0 x 12 rebel bare-metal stent, postdilated to 3.6 mm in diameter(see full report below).  The patient was set up to use a CPAP that evening.  He had some SOB and was given 20mg  IV lasix.  ASA and Effient.  The Case Manager was asked to help with Effient.  Metformin held until Sunday. The patient was seen by Dr. Eldridge Dace who felt he was stable for DC home.  Follow up with Dr. Anne Fu.    Consults: None  Significant Diagnostic Studies:  LEFT HEART  CATH  CONTRAST: Total of 150 cc.  COMPLICATIONS: None.  HEMODYNAMICS: Aortic pressure was 111/68; LV pressure was 111/10; LVEDP 18. There was no gradient between the left ventricle and aorta.  ANGIOGRAPHIC DATA: The left main coronary artery is widely patent.  The left anterior descending artery is a large vessel which wraps around the apex. There is mild, diffuse atherosclerosis without significant obstruction. There are several, small diagonal vessels which are widely patent.  The left circumflex artery is a large vessel proximally. There 2 trivial obtuse marginals which are patent. Before the first large obtuse marginal, there is a focal 90% lesion. In the large, second obtuse marginal, there is a hazy 75% lesion.  The right coronary artery is a large dominant vessel. The proximal stent is patent with mild in-stent restenosis. There is mild, diffuse atherosclerosis throughout the RCA system. The posterior descending artery is large with only mild atherosclerosis. Posterior lateral artery is also large minimal atherosclerosis.  LEFT VENTRICULOGRAM: Left ventricular angiogram was done in the 30 RAO projection and revealed normal left ventricular wall motion and systolic function with an estimated ejection fraction of 60 %. LVEDP was 18 mmHg.  PCI NARRATIVE: A CLS 3.0 guiding catheters using his left main. Intravenous heparin and intravenous tirofiban were used for anticoagulation. An ACT was use checked at the anticoagulation was therapeutic. A pro-water wire was placed across the areas of disease in the circumflex system. A 2.5 x 12 balloon  was used to predilate the more proximal area disease. A 3.0 x 12 rebel bare-metal stent was advanced to the more distal area which was direct stented. A 3.5 x 12 rebel bare-metal stent was then deployed in the proximal lesion. The origin of the large first obtuse marginal was not covered by stent. A gap of approximately 15 mm was left between the stents. A 3.5 x 8  noncompliant balloon was used to post dilate the more distal stent, up to 3.6 mm. The same balloon was used to post dilate the more proximal stent to high pressure, up to 3.7 mm in diameter. Several doses of intra-coronary nitroglycerin were administered to treat vasospasm. There was an excellent angiographic result. There is no residual stenosis  IMPRESSIONS:  1. Widely patent left main coronary artery. 2. Widely patent left anterior descending artery and its branches. 3. 90% stenosis in the mid left circumflex artery and 75% stenosis in the OM 2. 3.5 x 12 rebel bare-metal stent to the mid circumflex lesion, postdilated to 3.7 mm in diameter. OM 2 lesion stented with a 3.0 x 12 rebel bare-metal stent, postdilated to 3.6 mm in diameter. 4. Patent stents in the proximal right coronary artery. 5. Normal left ventricular systolic function. LVEDP 18 mmHg. Ejection fraction 60%. RECOMMENDATION: He'll need dual antiplatelet therapy for at least a month. Bare-metal stents were chosen because of his poor social situation and questionable compliance with long term dual antiplatelet therapy. We'll have to get a case manager to try to help with medication compliance. Followup care will be with Dr. Anne Fu.  Cardiac Panel (last 3 results)  Recent Labs  11/07/13 1405 11/07/13 1927 11/08/13 0035  TROPONINI 4.71* 3.93* 1.92*   Lipid Panel     Component Value Date/Time   CHOL 130 08/24/2012 1548   TRIG 193* 08/24/2012 1548   HDL 32* 08/24/2012 1548   CHOLHDL 4.1 08/24/2012 1548   VLDL 39 08/24/2012 1548   LDLCALC 59 08/24/2012 1548    Treatments: See above  Discharge Exam: Blood pressure 115/65, pulse 65, temperature 97.2 F (36.2 C), temperature source Oral, resp. rate 18, height 5' 6.93" (1.7 m), weight 267 lb 13.7 oz (121.5 kg), SpO2 94.00%.   Disposition: 01-Home or Self Care      Discharge Orders   Future Appointments Provider Department Dept Phone   11/14/2013 3:00 PM Peter M [place], MD  [facility] [place] Office [phone]   11/15/2013 9:00 AM Kathrin Ruddy, [facility] [phone]   Future Orders Complete By Expires   Diet - low sodium heart healthy  As directed    Discharge instructions  As directed    Comments:     No lifting with right arm for two days.   Restart the Metformin on Sunday.   Increase activity slowly  As directed        Medication List         ACCU-CHEK FASTCLIX LANCETS Misc  1 Units by Percutaneous route 4 (four) times daily.     aspirin 81 MG chewable tablet  Chew 1 tablet (81 mg total) by mouth daily.     atenolol 25 MG tablet  Commonly known as:  TENORMIN  Take 1 tablet (25 mg total) by mouth daily.     FISH OIL PO  Take 2 capsules by mouth daily.     glipiZIDE 10 MG tablet  Commonly known as:  GLUCOTROL  Take 1 tablet (10 mg total) by mouth daily before  breakfast.     insulin aspart 100 UNIT/ML injection  Commonly known as:  novoLOG  Inject 12 Units into the skin 3 (three) times daily with meals.     insulin glargine 100 UNIT/ML injection  Commonly known as:  LANTUS  Inject 30 Units into the skin 2 (two) times daily.     Insulin Syringe-Needle U-100 31G X 5/16" 1 ML Misc  Commonly known as:  FREESTYLE PRECISION INS SYR  1 Units by Does not apply route as directed.     lisinopril 5 MG tablet  Commonly known as:  PRINIVIL,ZESTRIL  Take 0.5 tablets (2.5 mg total) by mouth 2 (two) times daily.     metFORMIN 1000 MG tablet  Commonly known as:  GLUCOPHAGE  Take 1 tablet (1,000 mg total) by mouth 2 (two) times daily with a meal.     nitroGLYCERIN 0.4 MG SL tablet  Commonly known as:  NITROSTAT  Place 1 tablet (0.4 mg total) under the tongue every 5 (five) minutes x 3 doses as needed for chest pain.     prasugrel 10 MG Tabs tablet  Commonly known as:  EFFIENT  Take 1 tablet (10 mg total) by mouth daily.     simvastatin 20 MG tablet  Commonly known as:  ZOCOR  Take 1 tablet (20 mg total) by  mouth every evening.     traMADol 50 MG tablet  Commonly known as:  ULTRAM  Take 1 tablet (50 mg total) by mouth every 6 (six) hours as needed.         SignedWilburt Finlay 11/08/2013, 8:13 AM   I have examined the patient and reviewed assessment and plan and discussed with patient.  Agree with above as stated.  I stressed the importance of dual antiplatelet therapy to the patient and his mother whom I spoke with yesterday. He received Effient in the past and tolerated this. We are trying to get him a supply of free prasugrel to help avoid stent thrombosis. I was reluctant to use Brilinta since it is a twice daily drug. Continue aggressive secondary prevention including diabetes control and smoking cessation.  Deaisha Welborn S.

## 2013-11-08 NOTE — Care Management Note (Signed)
    Page 1 of 1   11/08/2013     11:03:01 AM   CARE MANAGEMENT NOTE 11/08/2013  Patient:  [name],[name]   Account Number:  [id number]  Date Initiated:  11/08/2013  Documentation initiated by:  [facility]  Subjective/Objective Assessment:   53 yo with history of CAD  Stent to RCA 2013.  Was drinking last night  Awoke with SSCP similar to previous MI pain In ER relief with nitro.//Home with friends     Action/Plan:   PROCEDURE:  Left heart catheterization with selective coronary angiography, left ventriculogram.  PCI mid circumflex,  PCI OM 2//Benefits check for Effient   Anticipated DC Date:  11/08/2013   Anticipated DC Plan:  [place]  CM consult      Choice offered to / List presented to:             Status of service:  Completed, signed off Medicare Important Message given?   (If response is "NO", the following Medicare IM given date fields will be blank) Date Medicare IM given:   Date Additional Medicare IM given:    Discharge Disposition:    Per UR Regulation:    If discussed at Long Length of Stay Meetings, dates discussed:    Comments:  11/08/13 0900 Fuller Mandril, RN, BSN, [employer] [phone] Spoke with pt at bedside regarding benefits check for Effient.  Pt has brochure with 30 day free card and refill assistance card intact.  Pt utilizes [facility] on [address] for prescription needs.  NCM called pharmacy to confirm availability of medication.  Information relayed to pt.  Pt verbalizes importance of filling medication upon discharge.

## 2013-11-08 NOTE — Clinical Documentation Improvement (Signed)
  Possible Clinical Conditions?      Acute Diastolic Congestive Heart Failure     Acute on Chronic Diastolic Congestive Heart Failure     Other Condition__________________  Supporting Information:  In a Progress Note from 11/08/13, Dr. Irish Lack documented, "He may have some diastolic heart failure explaining the [facility]."  Treatment:  Per Tarri Fuller, PA-C, in the Discharge Summary he documented "The patient was set up to use a CPAP that evening. He had some SOB and was given 20mg  IV lasix."  Thank You,  Posey Pronto, RN, BSN, [place] Documentation Improvement Specialist HIM department--[place] Office [phone]

## 2013-11-08 NOTE — Progress Notes (Signed)
Subjective: Some SOB yesterday but it has improved.    Objective: Vital signs in last 24 hours: Temp:  [97.5 F (36.4 C)-98.7 F (37.1 C)] 97.5 F (36.4 C) (02/27 0430) Pulse Rate:  [63-95] 63 (02/27 0430) Resp:  [16-26] 20 (02/27 0430) BP: (90-180)/(30-106) 121/67 mmHg (02/27 0430) SpO2:  [3 %-99 %] 95 % (02/27 0430) Weight:  [267 lb 13.7 oz (121.5 kg)-268 lb 15.4 oz (122 kg)] 267 lb 13.7 oz (121.5 kg) (02/27 0430) Last BM Date: 11/06/13  Intake/Output from previous day: 02/26 0701 - 02/27 0700 In: 1270 [P.O.:960; I.V.:310] Out: -  Intake/Output this shift: Total I/O In: 400 [P.O.:240; I.V.:160] Out: -   Medications Current Facility-Administered Medications  Medication Dose Route Frequency Provider Last Rate Last Dose  . 0.9 %  sodium chloride infusion  250 mL Intravenous PRN Josue Hector, MD      . 0.9 %  sodium chloride infusion  250 mL Intravenous PRN Josue Hector, MD 10 mL/hr at 11/08/13 0000 250 mL at 11/08/13 0000  . aspirin chewable tablet 81 mg  81 mg Oral Daily Casandra Doffing, MD      . atenolol (TENORMIN) tablet 25 mg  25 mg Oral Daily Josue Hector, MD   25 mg at 11/07/13 1810  . glipiZIDE (GLUCOTROL) tablet 10 mg  10 mg Oral QAC breakfast Josue Hector, MD      . insulin aspart (novoLOG) injection 0-9 Units  0-9 Units Subcutaneous TID WC Josue Hector, MD      . insulin aspart (novoLOG) injection 12 Units  12 Units Subcutaneous TID WC Josue Hector, MD   12 Units at 11/07/13 1800  . insulin glargine (LANTUS) injection 30 Units  30 Units Subcutaneous BID Josue Hector, MD   30 Units at 11/07/13 2111  . lisinopril (PRINIVIL,ZESTRIL) tablet 2.5 mg  2.5 mg Oral BID Josue Hector, MD   2.5 mg at 11/07/13 2111  . nitroGLYCERIN (NITROSTAT) SL tablet 0.4 mg  0.4 mg Sublingual Q5 Min x 3 PRN Josue Hector, MD      . prasugrel (EFFIENT) tablet 10 mg  10 mg Oral Daily Casandra Doffing, MD      . simvastatin (ZOCOR) tablet 20 mg  20 mg Oral QPM Josue Hector, MD    20 mg at 11/07/13 1810  . sodium chloride 0.9 % injection 3 mL  3 mL Intravenous Q12H Josue Hector, MD      . sodium chloride 0.9 % injection 3 mL  3 mL Intravenous PRN Josue Hector, MD      . sodium chloride 0.9 % injection 3 mL  3 mL Intravenous Q12H Josue Hector, MD      . sodium chloride 0.9 % injection 3 mL  3 mL Intravenous PRN Josue Hector, MD        PE: General appearance: alert, cooperative and Patient was resting with CPAP on when I entered. Lungs: clear to auscultation bilaterally Heart: regular rate and rhythm, S1, S2 normal, no murmur, click, rub or gallop Extremities: No LEE Pulses: 2+ and symmetric Skin: Warm and dry.  Radial cath site stable. Neurologic: Grossly normal  Lab Results:   Recent Labs  11/07/13 0602 11/08/13 0035  WBC 10.8* 10.6*  HGB 16.2 14.8  HCT 45.4 42.5  PLT 280 244   BMET  Recent Labs  11/07/13 0602 11/08/13 0035  NA 139 137  K 4.2 4.2  CL 97 103  CO2 24 23  GLUCOSE 189* 163*  BUN 10 9  CREATININE 0.88 0.68  CALCIUM 8.8 8.7   PT/INR  Recent Labs  11/07/13 0850  LABPROT 13.1  INR 1.01   Cardiac Panel (last 3 results)  Recent Labs  11/07/13 1405 11/07/13 1927 11/08/13 0035  TROPONINI 4.71* 3.93* 1.92*   Lipid Panel     Component Value Date/Time   CHOL 130 08/24/2012 1548   TRIG 193* 08/24/2012 1548   HDL 32* 08/24/2012 1548   CHOLHDL 4.1 08/24/2012 1548   VLDL 39 08/24/2012 1548   LDLCALC 59 08/24/2012 1548   Assessment/Plan  Active Problems:   HTN (hypertension)   Hyperlipidemia   OSA on CPAP   CAD (coronary artery disease)   DM type 2 (diabetes mellitus, type 2)   Obesity   Tobacco use   Chest pain   S/P PTCA (percutaneous transluminal coronary angioplasty)   Intermediate coronary syndrome   NSTEMI (non-ST elevated myocardial infarction)  53 yo with history of CAD Stent to RCA 2013. Was drinking last night Awoke with SSCP similar to previous MI pain   Plan:   S/P coronary angiography  revealing:  1. Widely patent left main coronary artery. 2. Widely patent left anterior descending artery and its branches. 3. 90% stenosis in the mid left circumflex artery and 75% stenosis in the OM 2. 3.5 x 12 rebel bare-metal stent to the mid circumflex lesion, postdilated to 3.7 mm in diameter. OM 2 lesion stented with a 3.0 x 12 rebel bare-metal stent, postdilated to 3.6 mm in diameter. 4. Patent stents in the proximal right coronary artery. 5. Normal left ventricular systolic function. LVEDP 18 mmHg. Ejection fraction 60%.  Labile BP yesterday.  Appropriate this morning.  SCr stable.  Troponin trending down.  ASA, Effient, atenolol 25,  lisinopril 2.5, Zocor.  DC home today.      LOS: 1 day    HAGER, BRYAN 11/08/2013 6:50 AM  I have examined the patient and reviewed assessment and plan and discussed with patient.  Agree with above as stated.  Will give one dose of Lasix 20 mg IV x1.  He may have some diastolic heart failure explaining the [facility].  Wwe need to make sure that he can get his Effient 30 day supply for free.  He used the 30 day free card 2 years ago so hopefully, he is eligible.  Tyisha Cressy S.

## 2013-11-08 NOTE — Progress Notes (Signed)
Retro ur ins review. 

## 2013-11-12 ENCOUNTER — Ambulatory Visit: Payer: No Typology Code available for payment source | Admitting: Cardiology

## 2013-11-14 ENCOUNTER — Ambulatory Visit (INDEPENDENT_AMBULATORY_CARE_PROVIDER_SITE_OTHER): Payer: No Typology Code available for payment source | Admitting: Cardiology

## 2013-11-14 ENCOUNTER — Telehealth: Payer: Self-pay | Admitting: *Deleted

## 2013-11-14 ENCOUNTER — Encounter: Payer: Self-pay | Admitting: Cardiology

## 2013-11-14 VITALS — BP 115/52 | HR 66 | Ht 66.0 in | Wt 268.0 lb

## 2013-11-14 DIAGNOSIS — G4733 Obstructive sleep apnea (adult) (pediatric): Secondary | ICD-10-CM

## 2013-11-14 DIAGNOSIS — E119 Type 2 diabetes mellitus without complications: Secondary | ICD-10-CM

## 2013-11-14 DIAGNOSIS — I214 Non-ST elevation (NSTEMI) myocardial infarction: Secondary | ICD-10-CM

## 2013-11-14 DIAGNOSIS — E785 Hyperlipidemia, unspecified: Secondary | ICD-10-CM

## 2013-11-14 DIAGNOSIS — I2510 Atherosclerotic heart disease of native coronary artery without angina pectoris: Secondary | ICD-10-CM

## 2013-11-14 DIAGNOSIS — I10 Essential (primary) hypertension: Secondary | ICD-10-CM

## 2013-11-14 DIAGNOSIS — Z9989 Dependence on other enabling machines and devices: Secondary | ICD-10-CM

## 2013-11-14 NOTE — Progress Notes (Signed)
[name] [name] Date of Birth: June 16, 1961 Medical Record #[id number]  History of Present Illness: [name] [name] is seen for followup today. He has a history of coronary disease and is status post stenting of the right coronary in May of 2013. He was recently admitted with a NSTEMI and was found to have severe disease in the LCx. This was treated with BMS. Since DC he has had no further chest pain. He states he is not smoking. Reports compliance with medication.  Current Outpatient Prescriptions on File Prior to Visit  Medication Sig Dispense Refill  . ACCU-CHEK FASTCLIX LANCETS MISC 1 Units by Percutaneous route 4 (four) times daily.  100 each  12  . aspirin 81 MG chewable tablet Chew 1 tablet (81 mg total) by mouth daily.  180 tablet  2  . atenolol (TENORMIN) 25 MG tablet Take 1 tablet (25 mg total) by mouth daily.  30 tablet  11  . glipiZIDE (GLUCOTROL) 10 MG tablet Take 1 tablet (10 mg total) by mouth daily before breakfast.  30 tablet  11  . insulin aspart (NOVOLOG) 100 UNIT/ML injection Inject 12 Units into the skin 3 (three) times daily with meals.  1 vial  3  . insulin glargine (LANTUS) 100 UNIT/ML injection Inject 30 Units into the skin 2 (two) times daily.      . Insulin Syringe-Needle U-100 (FREESTYLE PRECISION INS SYR) 31G X 5/16" 1 ML MISC 1 Units by Does not apply route as directed.  100 each  5  . lisinopril (PRINIVIL,ZESTRIL) 5 MG tablet Take 0.5 tablets (2.5 mg total) by mouth 2 (two) times daily.  60 tablet  11  . metFORMIN (GLUCOPHAGE) 1000 MG tablet Take 1 tablet (1,000 mg total) by mouth 2 (two) times daily with a meal.  60 tablet  11  . nitroGLYCERIN (NITROSTAT) 0.4 MG SL tablet Place 1 tablet (0.4 mg total) under the tongue every 5 (five) minutes x 3 doses as needed for chest pain.  25 tablet  12  . Omega-3 Fatty Acids (FISH OIL PO) Take 2 capsules by mouth daily.      . prasugrel (EFFIENT) 10 MG TABS tablet Take 1 tablet (10 mg total) by mouth daily.  30 tablet  10  .  simvastatin (ZOCOR) 20 MG tablet Take 1 tablet (20 mg total) by mouth every evening.  30 tablet  11  . traMADol (ULTRAM) 50 MG tablet Take 1 tablet (50 mg total) by mouth every 6 (six) hours as needed.  42 tablet  5   No current facility-administered medications on file prior to visit.    Allergies  Allergen Reactions  . Shellfish Allergy Nausea And Vomiting    Past Medical History  Diagnosis Date  . Hypertension   . Hyperlipidemia   . CAD (coronary artery disease) 01/18/12    BMS to RCA  . GERD (gastroesophageal reflux disease)   . OSA on CPAP 01/19/2012  . Type II diabetes mellitus   . WFUXNATF(573.2)     "weekly" (11/07/2013)  . Migraines     "~ q 2 wks" (11/07/2013)  . Arthritis     "legs, arm, back" (11/07/2013)  . Chronic back pain     "mid/lower" (11/07/2013)  . Depression     Past Surgical History  Procedure Laterality Date  . Coronary angioplasty with stent placement  01/2012; 11/07/2013    "1 + 2" (11/07/2013)    History  Smoking status  . Current Some Day Smoker -- 0.50 packs/day for 35 years  .  Types: Cigarettes  Smokeless tobacco  . Never Used    History  Alcohol Use  . 14.4 oz/week  . 24 Cans of beer per week    Comment: 11/07/2013 "12 beers twice a week"    Family History  Problem Relation Age of Onset  . Heart attack Father 69  . Heart attack Brother   . Stroke Brother   . Cancer Mother     breast  . Hypertension Sister   . Hyperlipidemia Sister   . Hyperlipidemia Sister   . Hypertension Sister     Review of Systems: As noted in history of present illness.  All other systems were reviewed and are negative.  Physical Exam: BP 115/52  Pulse 66  Ht 5\' 6"  (1.676 m)  Wt 268 lb (121.564 kg)  BMI 43.28 kg/m2 He is an obese white male in no acute distress.     The HEENT exam is unremarkable. The carotids are 2+ without bruits.  There is no thyromegaly.  There is no JVD.  The lungs are clear.  The chest wall is non tender.  The heart exam reveals a  regular rate with a normal S1 and S2.  There are no murmurs, gallops, or rubs.  The PMI is not displaced.   Abdominal exam reveals good bowel sounds. It is morbidly obese. There is no hepatosplenomegaly or tenderness.  There are no masses.  Exam of the legs reveal no clubbing, cyanosis, or edema.  No radial site hematoma.  The distal pulses are intact.  Cranial nerves II - XII are intact.  Motor and sensory functions are intact.  The gait is normal.  LABORATORY DATA: Lab Results  Component Value Date   WBC 10.6* 11/08/2013   HGB 14.8 11/08/2013   HCT 42.5 11/08/2013   PLT 244 11/08/2013   GLUCOSE 163* 11/08/2013   CHOL 130 08/24/2012   TRIG 193* 08/24/2012   HDL 32* 08/24/2012   LDLCALC 59 08/24/2012   ALT 18 11/07/2013   AST 20 11/07/2013   NA 137 11/08/2013   K 4.2 11/08/2013   CL 103 11/08/2013   CREATININE 0.68 11/08/2013   BUN 9 11/08/2013   CO2 23 11/08/2013   TSH 1.100 01/18/2012   INR 1.01 11/07/2013   HGBA1C 7.3 10/04/2013    Assessment / Plan: 1. Coronary disease status post bare-metal stent of the RCA in May of 2013. Recent NSTEMI with BMS of the OM2. We will continue with his beta blocker, ACE inhibitor, and statin therapy. Continue aspirin. Would like to continue Effient for one year if possible. We will see if we can get patient assistance for this.   2. Hypertension, well controlled.   3. Diabetes mellitus, poorly controlled. This is managed by his primary care.  4. Hyperlipidemia. On Zocor.  I will follow up in one month with fasting lab work.

## 2013-11-14 NOTE — Patient Instructions (Addendum)
Continue your current therapy  Stop smoking completely  I will see you in 1 months with lab work

## 2013-11-14 NOTE — Telephone Encounter (Signed)
Message copied by Fernande Boyden on Thu Nov 14, 2013  5:32 PM ------      Message from: Golden Hurter D      Created: Thu Nov 14, 2013  3:48 PM       Aggie Moats Dr.Jordan's patient needs patient assistance with Effient.            Thanks for your help  Malachy Mood ------

## 2013-11-14 NOTE — Telephone Encounter (Signed)
Application for patient assistance [place] for EFFIENT mailed to patient, will get MD portion signed with Dr Martinique is back in office.

## 2013-11-15 ENCOUNTER — Ambulatory Visit: Payer: No Typology Code available for payment source | Admitting: Pharmacist

## 2013-11-21 ENCOUNTER — Encounter (HOSPITAL_COMMUNITY)
Admission: RE | Admit: 2013-11-21 | Discharge: 2013-11-21 | Disposition: A | Discharge status: Home / Self Care | Payer: No Typology Code available for payment source | Source: Ambulatory Visit | Attending: Cardiology | Admitting: Cardiology

## 2013-11-21 DIAGNOSIS — Z9861 Coronary angioplasty status: Secondary | ICD-10-CM | POA: Insufficient documentation

## 2013-11-21 DIAGNOSIS — E785 Hyperlipidemia, unspecified: Secondary | ICD-10-CM | POA: Insufficient documentation

## 2013-11-21 DIAGNOSIS — I2510 Atherosclerotic heart disease of native coronary artery without angina pectoris: Secondary | ICD-10-CM | POA: Insufficient documentation

## 2013-11-21 DIAGNOSIS — Z5189 Encounter for other specified aftercare: Secondary | ICD-10-CM | POA: Insufficient documentation

## 2013-11-21 DIAGNOSIS — E119 Type 2 diabetes mellitus without complications: Secondary | ICD-10-CM | POA: Insufficient documentation

## 2013-11-21 DIAGNOSIS — E669 Obesity, unspecified: Secondary | ICD-10-CM | POA: Insufficient documentation

## 2013-11-21 DIAGNOSIS — I10 Essential (primary) hypertension: Secondary | ICD-10-CM | POA: Insufficient documentation

## 2013-11-21 DIAGNOSIS — F17200 Nicotine dependence, unspecified, uncomplicated: Secondary | ICD-10-CM | POA: Insufficient documentation

## 2013-11-21 DIAGNOSIS — I214 Non-ST elevation (NSTEMI) myocardial infarction: Secondary | ICD-10-CM | POA: Insufficient documentation

## 2013-11-21 NOTE — Progress Notes (Signed)
Cardiac Rehab Medication Review by a Pharmacist  Does the patient  feel that his/her medications are working for him/her?  yes  Has the patient been experiencing any side effects to the medications prescribed?  no  Does the patient measure his/her own blood pressure or blood glucose at home?  yes (BP and BG though not routinely)  Does the patient have any problems obtaining medications due to transportation or finances?   Yes - unable to get NTG due to cost  Understanding of regimen: good Understanding of indications: good Potential of compliance: good    Pharmacist comments: Mr. Chouinard describes a good understanding of his regimen. He does say he has not been able to obtain the NTG tablets as they are $18 at [employer]. He has an orange card, and said he has an appointment tomorrow regarding his prescriptions so he will ask about options to get the NTG at that point. I told him if he is still unable to obtain this medicine, he should call his doctor to inform them of this and to see what options are available. He complains of no side effects to his medications, though he occasionally has tingling in his feet, which we discussed could be a potential complication of his diabetes. All questions were addressed at this visit.     Lavonne Kinderman C. Abdulhadi Stopa, PharmD Clinical Pharmacist-Resident Pager: [phone] Pharmacy: [phone] 11/21/2013 8:51 AM

## 2013-11-28 NOTE — Telephone Encounter (Signed)
Left patient message that I received a letter from [facility] requesting further information for this patient [place] application, I had mailed this application to patient to complete on 11/14/2013. I already have faxed them the script for the EFFIENT.

## 2013-11-29 ENCOUNTER — Ambulatory Visit (INDEPENDENT_AMBULATORY_CARE_PROVIDER_SITE_OTHER): Payer: No Typology Code available for payment source | Admitting: Pharmacist

## 2013-11-29 ENCOUNTER — Telehealth: Payer: Self-pay | Admitting: Family Medicine

## 2013-11-29 ENCOUNTER — Encounter: Payer: Self-pay | Admitting: Pharmacist

## 2013-11-29 VITALS — BP 119/80 | HR 78 | Ht 67.0 in | Wt 272.0 lb

## 2013-11-29 DIAGNOSIS — E119 Type 2 diabetes mellitus without complications: Secondary | ICD-10-CM

## 2013-11-29 DIAGNOSIS — Z720 Tobacco use: Secondary | ICD-10-CM

## 2013-11-29 DIAGNOSIS — R0602 Shortness of breath: Secondary | ICD-10-CM

## 2013-11-29 DIAGNOSIS — F17200 Nicotine dependence, unspecified, uncomplicated: Secondary | ICD-10-CM

## 2013-11-29 DIAGNOSIS — I2510 Atherosclerotic heart disease of native coronary artery without angina pectoris: Secondary | ICD-10-CM

## 2013-11-29 MED ORDER — NITROGLYCERIN 0.4 MG SL SUBL: 0.4000 mg | SUBLINGUAL_TABLET | SUBLINGUAL | Status: DC | PRN

## 2013-11-29 MED ORDER — MOMETASONE FURO-FORMOTEROL FUM 200-5 MCG/ACT IN AERO: 2.0000 | INHALATION_SPRAY | Freq: Two times a day (BID) | RESPIRATORY_TRACT | Status: DC

## 2013-11-29 MED ORDER — ALBUTEROL SULFATE HFA 108 (90 BASE) MCG/ACT IN AERS: 2.0000 | INHALATION_SPRAY | Freq: Four times a day (QID) | RESPIRATORY_TRACT | Status: DC | PRN

## 2013-11-29 NOTE — Assessment & Plan Note (Signed)
Diabetes longstanding under suboptimal control.   D/C Glipizide and increase Novolog from 12 to 15 units prior to his 2 meals per day.   Reevaluate blood glucose control at next visit.

## 2013-11-29 NOTE — Telephone Encounter (Signed)
Patient dropped off papers to be filled out for [facility].  Please fax when completed.

## 2013-11-29 NOTE — Patient Instructions (Addendum)
Thank you for coming in today! Congratulations on your graduation!!  Stop taking glipizide.   Increase your Novolog to 15 units three times a day with meals.   Start using the [facility] inhaler 2 puffs two times a day.   Try to cut down on your smoking.   Follow up with Dr. Valentina Lucks on April 2nd.

## 2013-11-29 NOTE — Assessment & Plan Note (Signed)
Spirometry evaluation reveals severe/very severe obstructive lung disease corresponding to GOLD Classification C based on spirometry, and mMRC score of 2.   Post nebulized albuterol tx revealed improved FEV1 of 48 to 58%.   Patient has been experiencing dyspnea and is NOT currently taking any inhalation medication. begin [facility] and Albuterol Inhaler.  treatment plan at this time.  Educated patient on purpose, proper use, potential adverse effects including risk of esophageal candidiasis and need to rinse mouth after each use with Dulera.   Reviewed results of pulmonary function tests.  Pt verbalized understanding of results and education.   Longstanding Nicotine Abuse reported to be PRN drinking ETOH only.   Patient willing to quit alcohol and tobacco at this time.   Encouraged abstinence - reevaluate at next visit.  Written pt instructions provided.  F/U Clinic visit in ~ 10 days for nicotine abuse reevaluation, lung function update, and additional sample.  New prescription for Albuterol and Dulera called in to [place] MAP program.  Total time in face to face counseling 55 minutes.  Patient seen with Epimenio Sarin, PharmD Candidate, Terrilyn Saver, PharmD Resident, and Wilfred Curtis, PharmD Resident.

## 2013-11-29 NOTE — Assessment & Plan Note (Signed)
Status post-Stent procedure - has NOT obtained nitroglycerin at this time.  Called new Rx to [facility] - $6 orange card supply.  Patient understands need to pick this up in near future.

## 2013-11-29 NOTE — Progress Notes (Signed)
S:    Patient arrives walking without assistance.  Presents for lung function evaluation.  Patient reports breathing has been decreased however slightly improved since his chest pain resolved post-stent procedure.     O: mMRC score= 2  See "scanned report" or Documentation Flowsheet (discrete results - PFTs) for  Spirometry results. Patient provided good effort while attempting spirometry.   Lung Age = 99 Albuterol Neb  Lot# [id number]     Exp. 06/2015  A/P: Spirometry evaluation reveals severe/very severe obstructive lung disease corresponding to GOLD Classification C based on spirometry, and mMRC score of 2.   Post nebulized albuterol tx revealed improved FEV1 of 48 to 58%.   Patient has been experiencing dyspnea and is NOT currently taking any inhalation medication. begin [facility] and Albuterol Inhaler.  treatment plan at this time.  Educated patient on purpose, proper use, potential adverse effects including risk of esophageal candidiasis and need to rinse mouth after each use with Dulera.   Reviewed results of pulmonary function tests.  Pt verbalized understanding of results and education.    Status post-Stent procedure - has NOT obtained nitroglycerin at this time.  Called new Rx to [facility] - $6 orange card supply.  Patient understands need to pick this up in near future.   Longstanding Nicotine Abuse reported to be PRN drinking ETOH only.   Patient willing to quit alcohol and tobacco at this time.   Encouraged abstinence - reevaluate at next visit.  Written pt instructions provided.  F/U Clinic visit in ~ 10 days for nicotine abuse reevaluation, lung function update, and additional sample.  New prescription for Albuterol and Dulera called in to [place] MAP program.  Total time in face to face counseling 55 minutes.  Patient seen with Epimenio Sarin, PharmD Candidate, Terrilyn Saver, PharmD Resident, and Wilfred Curtis, PharmD Resident.

## 2013-11-29 NOTE — Assessment & Plan Note (Signed)
Longstanding Nicotine Abuse reported to be PRN drinking ETOH only.   Patient willing to quit alcohol and tobacco at this time.   Encouraged abstinence - reevaluate at next visit.  Written pt instructions provided.  F/U Clinic visit in ~ 10 days for nicotine abuse reevaluation, lung function update, and additional sample.  New prescription for Albuterol and Dulera called in to [place] MAP program.  Total time in face to face counseling 55 minutes.  Patient seen with Epimenio Sarin, PharmD Candidate, Terrilyn Saver, PharmD Resident, and Wilfred Curtis, PharmD Resident.

## 2013-12-02 ENCOUNTER — Encounter (HOSPITAL_COMMUNITY)
Admission: RE | Admit: 2013-12-02 | Discharge: 2013-12-02 | Disposition: A | Discharge status: Home / Self Care | Payer: No Typology Code available for payment source | Source: Ambulatory Visit | Attending: Cardiology | Admitting: Cardiology

## 2013-12-02 ENCOUNTER — Encounter (HOSPITAL_COMMUNITY): Payer: Self-pay

## 2013-12-02 LAB — GLUCOSE, CAPILLARY
Glucose-Capillary: 149 mg/dL — ABNORMAL HIGH (ref 70–99)
Glucose-Capillary: 113 mg/dL — ABNORMAL HIGH (ref 70–99)

## 2013-12-02 NOTE — Progress Notes (Signed)
Pt started cardiac rehab today.  Pt tolerated light exercise without difficulty.  VSS, telemetry-NSR, Non specific ST-T wave changes. Pt oriented to exercise equipment and routine.  Understanding PSYCHOSOCIAL ASSESSMENT  Pt psychosocial assessment reveals no barriers to rehab participation.  Pt quality of life is slightly altered by current living arrangements.  His previous roommate passed away suddenly and he has moved in with his somewhat girlfriend. Pt admits she overindulges in alcohol which is tempting to him as well. Pt admits that he smokes more when is drinking, however has not smoked in 4 days.  Pt is excited about his college graduation in May from [employer].    Pt is hopeful and positive, seems to adapt well however often makes inappropriate choices.  Offered emotional support and reassurance.  Will continue to monitor.

## 2013-12-02 NOTE — Progress Notes (Signed)
Patient ID: [name] [name], male   DOB: 06/08/1961, 52 y.o.   MRN: [id number] Reviewed: Agree with Dr. Koval's documentation and management. 

## 2013-12-03 NOTE — Telephone Encounter (Signed)
Placed in MDs box. Floree Zuniga Dawn  

## 2013-12-04 ENCOUNTER — Encounter (HOSPITAL_COMMUNITY)
Admission: RE | Admit: 2013-12-04 | Discharge: 2013-12-04 | Disposition: A | Discharge status: Home / Self Care | Payer: No Typology Code available for payment source | Source: Ambulatory Visit | Attending: Cardiology | Admitting: Cardiology

## 2013-12-04 LAB — GLUCOSE, CAPILLARY
Glucose-Capillary: 127 mg/dL — ABNORMAL HIGH (ref 70–99)
Glucose-Capillary: 154 mg/dL — ABNORMAL HIGH (ref 70–99)

## 2013-12-09 ENCOUNTER — Encounter (HOSPITAL_COMMUNITY)
Admission: RE | Admit: 2013-12-09 | Discharge: 2013-12-09 | Disposition: A | Discharge status: Home / Self Care | Payer: No Typology Code available for payment source | Source: Ambulatory Visit | Attending: Cardiology | Admitting: Cardiology

## 2013-12-09 NOTE — Progress Notes (Signed)
Pt arrived at cardiac rehab c/o nausea and vomiting within last 24 hours.   Pt reports he did not eat yesterday due to symptoms.  However has eaten today and able to tolerate.  Pt advised best to not exercise today.  Pt instructed to increase po fluid intake today and eat bland foods.  Pt states he does have soup and toast.  Pt instructed if asymptomatic, he may return to exercise next session 12/11/13 and also instructed to call PCP if symptoms return.  Understanding verbalized

## 2013-12-10 ENCOUNTER — Ambulatory Visit: Payer: No Typology Code available for payment source | Admitting: Cardiology

## 2013-12-10 NOTE — Telephone Encounter (Signed)
MD has completed most but need additional info from pt.   LMOVM for pt to return call.  Please find out:  1.  What is he trying to accomplish with this form?    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  What are his limitation/struggles with school?   Advise that I need this info to complete the form [name] [name], [employer]

## 2013-12-11 ENCOUNTER — Encounter (HOSPITAL_COMMUNITY): Payer: No Typology Code available for payment source

## 2013-12-12 ENCOUNTER — Ambulatory Visit: Payer: No Typology Code available for payment source | Admitting: Cardiology

## 2013-12-12 ENCOUNTER — Ambulatory Visit: Payer: No Typology Code available for payment source | Admitting: Pharmacist

## 2013-12-12 ENCOUNTER — Other Ambulatory Visit: Payer: No Typology Code available for payment source

## 2013-12-16 ENCOUNTER — Encounter (HOSPITAL_COMMUNITY): Payer: No Typology Code available for payment source

## 2013-12-16 NOTE — Telephone Encounter (Signed)
pc to pt to assess reason for continued absence from cardiac rehab.  Pt states he still has diarrhea, last loose stool yesterday, no BM today. Pt has been drinking gatorade.  Pt instructed to not return to exercise until symptom free 48 hours.  Understanding verbalized

## 2013-12-18 ENCOUNTER — Telehealth (HOSPITAL_COMMUNITY): Payer: Self-pay | Admitting: Family Medicine

## 2013-12-18 ENCOUNTER — Encounter (HOSPITAL_COMMUNITY): Payer: No Typology Code available for payment source

## 2013-12-18 ENCOUNTER — Other Ambulatory Visit: Payer: Self-pay | Admitting: *Deleted

## 2013-12-18 MED ORDER — PRASUGREL HCL 10 MG PO TABS: 10.0000 mg | ORAL_TABLET | Freq: Every day | ORAL | Status: DC

## 2013-12-20 ENCOUNTER — Ambulatory Visit: Payer: No Typology Code available for payment source | Admitting: Pharmacist

## 2013-12-23 ENCOUNTER — Encounter (HOSPITAL_COMMUNITY)
Admission: RE | Admit: 2013-12-23 | Discharge: 2013-12-23 | Disposition: A | Discharge status: Home / Self Care | Payer: No Typology Code available for payment source | Source: Ambulatory Visit | Attending: Cardiology | Admitting: Cardiology

## 2013-12-23 DIAGNOSIS — I10 Essential (primary) hypertension: Secondary | ICD-10-CM | POA: Insufficient documentation

## 2013-12-23 DIAGNOSIS — I214 Non-ST elevation (NSTEMI) myocardial infarction: Secondary | ICD-10-CM | POA: Insufficient documentation

## 2013-12-23 DIAGNOSIS — F17200 Nicotine dependence, unspecified, uncomplicated: Secondary | ICD-10-CM | POA: Insufficient documentation

## 2013-12-23 DIAGNOSIS — E785 Hyperlipidemia, unspecified: Secondary | ICD-10-CM | POA: Insufficient documentation

## 2013-12-23 DIAGNOSIS — E119 Type 2 diabetes mellitus without complications: Secondary | ICD-10-CM | POA: Insufficient documentation

## 2013-12-23 DIAGNOSIS — E669 Obesity, unspecified: Secondary | ICD-10-CM | POA: Insufficient documentation

## 2013-12-23 DIAGNOSIS — I2510 Atherosclerotic heart disease of native coronary artery without angina pectoris: Secondary | ICD-10-CM | POA: Insufficient documentation

## 2013-12-23 DIAGNOSIS — Z9861 Coronary angioplasty status: Secondary | ICD-10-CM | POA: Insufficient documentation

## 2013-12-23 DIAGNOSIS — Z5189 Encounter for other specified aftercare: Secondary | ICD-10-CM | POA: Insufficient documentation

## 2013-12-23 NOTE — Progress Notes (Signed)
[name] [name] 53 y.o. male Nutrition Note Spoke with pt. Pt well-known to this Probation officer from previous admission. Nutrition Plan and Nutrition Survey goals reviewed with pt. Pt has several areas for improvement in his diet. Pt is not following the Therapeutic Lifestyle Changes diet, however reports is improving on his eating habits. Pt reports going out to eat frequently, but has been trying to choose healthier options, such as salads instead of hamburgers occasionally and avoiding very greasy foods. Pt also reports that he rarely drinks alcohol anymore. Pt does report that sometimes it is difficult to eat hard and crunchy texture foods because he does not have his top teeth, however reports he plans to get dentures put in place in May. Pt reports he does not think he is meeting the daily recommended amount of fruit and vegetable servings. Pt was educated on ways he can increase his fruit and vegetable intake. Pt was educated on ways he can decrease his saturated fat and trans fat intake in his diet. Pt wants to lose wt. Pt has been trying to lose wt by exercising and walking more frequently. Pt reports he has lost weight and is happy about his weight loss progress. Pt reports to weigh 121.8 kg today where as at the start of the program pt weighed 123.8 kg. Wt loss tips reviewed.  Pt is diabetic. Last A1c indicates blood glucose not optimally controlled. On previous admission, pt uncomfortable giving himself insulin shots. Pt now reports being comfortable with giving himself his insulin shots. Pt reports that he checks his blood sugar every night a couple hours before he goes to sleep. Pt reports his blood glucose range to be ~150-210 mg/dL. Before class today, pt had a measured blood glucose of 283 mg/dL. Pt explains that he had a Pepsi with lunch before coming in and expressed that he usually drinks diet drinks or water. Pt was educated on monitoring his carbohydrate intake and serving sizes during his meals. This  Probation officer went over Diabetes Education test results. Pt expressed understanding of the information reviewed. Pt aware of nutrition education classes offered and is unable to attend nutrition classes. Pt was given a handout of the information that will be covered during the classes.  Nutrition Diagnosis   Food-and nutrition-related knowledge deficit related to lack of exposure to information as related to diagnosis of: ? CVD ? DM (A1c 7.3)   Obesity related to excessive energy intake as evidenced by a BMI of 43.4  Nutrition RX/ Estimated Daily Nutrition Needs for: wt loss  1950-2350 Kcal, 50-65 gm fat, 12-16 gm sat fat, 18-24 gm trans-fat, <1500 mg sodium, 250-325 gm CHO   Nutrition Intervention   Pt's individual nutrition plan reviewed with pt.   Benefits of adopting Therapeutic Lifestyle Changes discussed when Medficts reviewed.   Pt to attend the Portion Distortion class   Pt given handouts for: ? Nutrition I class ? Nutrition II class ? Eating out Heart Healthy   Continue client-centered nutrition education by RD, as part of interdisciplinary care.  Goal(s)   Pt to describe the potential benefits of adopting Therapeutic Lifestyle Changes   Pt to identify food quantities necessary to achieve: ? wt loss to a goal wt of 248-266 lb (112.9-121.1 kg) at graduation from cardiac rehab.    Pt to describe the benefit of including fruits, vegetables, whole grains, and low-fat dairy products in a heart healthy meal plan.   CBG concentrations in the normal range or as close to normal as is safely  possible.  Monitor and Evaluate progress toward nutrition goal with team. Nutrition Risk: [place] Intern  12/23/2013 2:51 PM  Derek Mound, M.Ed, RD, LDN, CDE 12/23/2013 3:13 PM

## 2013-12-24 ENCOUNTER — Ambulatory Visit: Payer: No Typology Code available for payment source | Admitting: Cardiology

## 2013-12-24 LAB — GLUCOSE, CAPILLARY: GLUCOSE-CAPILLARY: 286 mg/dL — AB (ref 70–99)

## 2013-12-25 ENCOUNTER — Telehealth: Payer: Self-pay | Admitting: Cardiology

## 2013-12-25 ENCOUNTER — Encounter: Payer: Self-pay | Admitting: Cardiology

## 2013-12-25 ENCOUNTER — Encounter (HOSPITAL_COMMUNITY)
Admission: RE | Admit: 2013-12-25 | Discharge: 2013-12-25 | Disposition: A | Discharge status: Home / Self Care | Payer: No Typology Code available for payment source | Source: Ambulatory Visit | Attending: Cardiology | Admitting: Cardiology

## 2013-12-25 LAB — GLUCOSE, CAPILLARY: Glucose-Capillary: 216 mg/dL — ABNORMAL HIGH (ref 70–99)

## 2013-12-25 NOTE — Telephone Encounter (Signed)
error 

## 2013-12-30 ENCOUNTER — Encounter (HOSPITAL_COMMUNITY)
Admission: RE | Admit: 2013-12-30 | Discharge: 2013-12-30 | Disposition: A | Discharge status: Home / Self Care | Payer: No Typology Code available for payment source | Source: Ambulatory Visit | Attending: Cardiology | Admitting: Cardiology

## 2013-12-30 LAB — GLUCOSE, CAPILLARY: GLUCOSE-CAPILLARY: 230 mg/dL — AB (ref 70–99)

## 2014-01-01 ENCOUNTER — Encounter (HOSPITAL_COMMUNITY)
Admission: RE | Admit: 2014-01-01 | Discharge: 2014-01-01 | Disposition: A | Discharge status: Home / Self Care | Payer: No Typology Code available for payment source | Source: Ambulatory Visit | Attending: Cardiology | Admitting: Cardiology

## 2014-01-01 LAB — GLUCOSE, CAPILLARY
GLUCOSE-CAPILLARY: 157 mg/dL — AB (ref 70–99)
GLUCOSE-CAPILLARY: 110 mg/dL — AB (ref 70–99)

## 2014-01-01 NOTE — Progress Notes (Signed)
I have reviewed home exercise with Shanon Brow. The patient was advised to walk 2-4 days per week outside of CRP II for 15 minutes, 2 times per day until he can walk 30 minutes continuously.  Pt will also complete one additional day of hand weights outside of CRP II.  Progression of exercise prescription was discussed.  Reviewed THR, pulse, RPE, sign and symptoms and when to call 911 or MD.  Pt voiced understanding.  Clifton [place], MS, ACSM RCEP 01/01/2014 2:50 PM

## 2014-01-06 ENCOUNTER — Encounter (HOSPITAL_COMMUNITY): Payer: No Typology Code available for payment source

## 2014-01-08 ENCOUNTER — Encounter (HOSPITAL_COMMUNITY): Payer: No Typology Code available for payment source

## 2014-01-13 ENCOUNTER — Encounter (HOSPITAL_COMMUNITY)
Admission: RE | Admit: 2014-01-13 | Discharge: 2014-01-13 | Disposition: A | Discharge status: Home / Self Care | Payer: No Typology Code available for payment source | Source: Ambulatory Visit | Attending: Cardiology | Admitting: Cardiology

## 2014-01-13 ENCOUNTER — Telehealth: Payer: Self-pay | Admitting: *Deleted

## 2014-01-13 ENCOUNTER — Telehealth: Payer: Self-pay | Admitting: Cardiology

## 2014-01-13 DIAGNOSIS — Z5189 Encounter for other specified aftercare: Secondary | ICD-10-CM | POA: Insufficient documentation

## 2014-01-13 DIAGNOSIS — Z9861 Coronary angioplasty status: Secondary | ICD-10-CM | POA: Insufficient documentation

## 2014-01-13 DIAGNOSIS — I2510 Atherosclerotic heart disease of native coronary artery without angina pectoris: Secondary | ICD-10-CM | POA: Insufficient documentation

## 2014-01-13 DIAGNOSIS — I10 Essential (primary) hypertension: Secondary | ICD-10-CM | POA: Insufficient documentation

## 2014-01-13 DIAGNOSIS — E785 Hyperlipidemia, unspecified: Secondary | ICD-10-CM | POA: Insufficient documentation

## 2014-01-13 DIAGNOSIS — F17200 Nicotine dependence, unspecified, uncomplicated: Secondary | ICD-10-CM | POA: Insufficient documentation

## 2014-01-13 DIAGNOSIS — I214 Non-ST elevation (NSTEMI) myocardial infarction: Secondary | ICD-10-CM | POA: Insufficient documentation

## 2014-01-13 DIAGNOSIS — E119 Type 2 diabetes mellitus without complications: Secondary | ICD-10-CM | POA: Insufficient documentation

## 2014-01-13 DIAGNOSIS — E669 Obesity, unspecified: Secondary | ICD-10-CM | POA: Insufficient documentation

## 2014-01-13 LAB — GLUCOSE, CAPILLARY: Glucose-Capillary: 225 mg/dL — ABNORMAL HIGH (ref 70–99)

## 2014-01-13 MED ORDER — INSULIN ASPART 100 UNIT/ML ~~LOC~~ SOLN: 15.0000 [IU] | Freq: Three times a day (TID) | SUBCUTANEOUS | Status: DC

## 2014-01-13 NOTE — Telephone Encounter (Signed)
Walk in pt Form "[facility] [employer]" Patient Assistance paper Dropped Off gave to Lovett Sox

## 2014-01-13 NOTE — Telephone Encounter (Signed)
Walk In pt Form " [facility]/Medical Condition Verification Form" Dropped Off gave to [facility]

## 2014-01-13 NOTE — Telephone Encounter (Signed)
Called and LM for MAP with rx details.  Asked them to call back if they need more instructions. Jazmin Hartsell,CMA

## 2014-01-13 NOTE — Telephone Encounter (Signed)
Received fax from [facility] MAP--unable to locate hardcopy Rx for Novolog.  Need to verify sigs, quantity, and any refills.  Per Epic--patient is Rx'd Novolog 15 units TID ac.  Will check with Dr. Kennon Rounds for quantity and additional refills and call MAP back afterwards.  Phone note routed to Dr. Kennon Rounds.  Burna Forts, BSN, RN-BC

## 2014-01-14 ENCOUNTER — Telehealth: Payer: Self-pay

## 2014-01-14 NOTE — Telephone Encounter (Signed)
Patient called no answer.Left message on personal voice mail received a message from [place] at cardiac rehab.Effient 10 mg samples left at 3rd floor front desk.

## 2014-01-15 ENCOUNTER — Encounter (HOSPITAL_COMMUNITY): Payer: No Typology Code available for payment source

## 2014-01-17 ENCOUNTER — Other Ambulatory Visit: Payer: No Typology Code available for payment source

## 2014-01-20 ENCOUNTER — Telehealth (HOSPITAL_COMMUNITY): Payer: Self-pay | Admitting: Family Medicine

## 2014-01-20 ENCOUNTER — Other Ambulatory Visit (INDEPENDENT_AMBULATORY_CARE_PROVIDER_SITE_OTHER): Payer: No Typology Code available for payment source

## 2014-01-20 ENCOUNTER — Encounter (HOSPITAL_COMMUNITY): Payer: No Typology Code available for payment source

## 2014-01-20 DIAGNOSIS — I10 Essential (primary) hypertension: Secondary | ICD-10-CM

## 2014-01-20 DIAGNOSIS — E785 Hyperlipidemia, unspecified: Secondary | ICD-10-CM

## 2014-01-20 DIAGNOSIS — E119 Type 2 diabetes mellitus without complications: Secondary | ICD-10-CM

## 2014-01-20 DIAGNOSIS — I214 Non-ST elevation (NSTEMI) myocardial infarction: Secondary | ICD-10-CM

## 2014-01-20 DIAGNOSIS — G4733 Obstructive sleep apnea (adult) (pediatric): Secondary | ICD-10-CM

## 2014-01-20 DIAGNOSIS — Z9989 Dependence on other enabling machines and devices: Secondary | ICD-10-CM

## 2014-01-20 DIAGNOSIS — I2510 Atherosclerotic heart disease of native coronary artery without angina pectoris: Secondary | ICD-10-CM

## 2014-01-20 LAB — BASIC METABOLIC PANEL
BUN: 12 mg/dL (ref 6–23)
CALCIUM: 9.4 mg/dL (ref 8.4–10.5)
CO2: 27 mEq/L (ref 19–32)
Chloride: 98 mEq/L (ref 96–112)
Creatinine, Ser: 0.9 mg/dL (ref 0.4–1.5)
GFR: 97.71 mL/min (ref >60.00)
GLUCOSE: 301 mg/dL — AB (ref 70–99)
POTASSIUM: 3.7 meq/L (ref 3.5–5.1)
Sodium: 133 mEq/L — ABNORMAL LOW (ref 135–145)

## 2014-01-20 LAB — LIPID PANEL
CHOLESTEROL: 152 mg/dL (ref 0–200)
HDL: 32.8 mg/dL — ABNORMAL LOW (ref >39.00)
LDL Cholesterol: 86 mg/dL (ref 0–99)
Total CHOL/HDL Ratio: 5
Triglycerides: 164 mg/dL — ABNORMAL HIGH (ref 0.0–149.0)
VLDL: 32.8 mg/dL (ref 0.0–40.0)

## 2014-01-20 LAB — HEPATIC FUNCTION PANEL
ALT: 14 U/L (ref 0–53)
AST: 15 U/L (ref 0–37)
Albumin: 3.5 g/dL (ref 3.5–5.2)
Alkaline Phosphatase: 111 U/L (ref 39–117)
BILIRUBIN DIRECT: 0 mg/dL (ref 0.0–0.3)
BILIRUBIN TOTAL: 0.5 mg/dL (ref 0.2–1.2)
TOTAL PROTEIN: 6.9 g/dL (ref 6.0–8.3)

## 2014-01-22 ENCOUNTER — Encounter (HOSPITAL_COMMUNITY)
Admission: RE | Admit: 2014-01-22 | Discharge: 2014-01-22 | Disposition: A | Discharge status: Home / Self Care | Payer: No Typology Code available for payment source | Source: Ambulatory Visit | Attending: Cardiology | Admitting: Cardiology

## 2014-01-22 LAB — GLUCOSE, CAPILLARY: Glucose-Capillary: 238 mg/dL — ABNORMAL HIGH (ref 70–99)

## 2014-01-24 ENCOUNTER — Encounter (HOSPITAL_COMMUNITY)
Admission: RE | Admit: 2014-01-24 | Discharge: 2014-01-24 | Disposition: A | Discharge status: Home / Self Care | Payer: No Typology Code available for payment source | Source: Ambulatory Visit | Attending: Cardiology | Admitting: Cardiology

## 2014-01-24 LAB — GLUCOSE, CAPILLARY: GLUCOSE-CAPILLARY: 255 mg/dL — AB (ref 70–99)

## 2014-01-27 ENCOUNTER — Encounter: Payer: Self-pay | Admitting: Family Medicine

## 2014-01-27 ENCOUNTER — Ambulatory Visit (INDEPENDENT_AMBULATORY_CARE_PROVIDER_SITE_OTHER): Payer: No Typology Code available for payment source | Admitting: Family Medicine

## 2014-01-27 ENCOUNTER — Encounter (HOSPITAL_COMMUNITY)
Admission: RE | Admit: 2014-01-27 | Discharge: 2014-01-27 | Disposition: A | Discharge status: Home / Self Care | Payer: No Typology Code available for payment source | Source: Ambulatory Visit | Attending: Cardiology | Admitting: Cardiology

## 2014-01-27 VITALS — BP 114/77 | HR 69 | Temp 98.2°F | Wt 265.0 lb

## 2014-01-27 DIAGNOSIS — I214 Non-ST elevation (NSTEMI) myocardial infarction: Secondary | ICD-10-CM

## 2014-01-27 DIAGNOSIS — F17200 Nicotine dependence, unspecified, uncomplicated: Secondary | ICD-10-CM

## 2014-01-27 DIAGNOSIS — Z9989 Dependence on other enabling machines and devices: Secondary | ICD-10-CM

## 2014-01-27 DIAGNOSIS — I10 Essential (primary) hypertension: Secondary | ICD-10-CM

## 2014-01-27 DIAGNOSIS — E669 Obesity, unspecified: Secondary | ICD-10-CM

## 2014-01-27 DIAGNOSIS — G4733 Obstructive sleep apnea (adult) (pediatric): Secondary | ICD-10-CM

## 2014-01-27 DIAGNOSIS — E119 Type 2 diabetes mellitus without complications: Secondary | ICD-10-CM

## 2014-01-27 DIAGNOSIS — I2510 Atherosclerotic heart disease of native coronary artery without angina pectoris: Secondary | ICD-10-CM

## 2014-01-27 DIAGNOSIS — Z720 Tobacco use: Secondary | ICD-10-CM

## 2014-01-27 LAB — GLUCOSE, CAPILLARY
Glucose-Capillary: 275 mg/dL — ABNORMAL HIGH (ref 70–99)
Glucose-Capillary: 232 mg/dL — ABNORMAL HIGH (ref 70–99)

## 2014-01-27 LAB — POCT GLYCOSYLATED HEMOGLOBIN (HGB A1C): Hemoglobin A1C: 9.4

## 2014-01-27 NOTE — Progress Notes (Signed)
[name] [name] 53 y.o. male Nutrition Note Spoke with pt. Pt's CBG's have been elevated higher than normal recently. Pt reports fasting CBG 295 mg/dL this morning. Pre-exercise CBG's have ranged from 156-286 mg/dL; 5 out of 8 readings >200 mg/dL. Per discussion, pt reports he eats 2 meals "sometimes 3 meals"/d. Pt insists he takes his Novolog "12-15 units, 3 times/day" even though pt eats 2 meals/d. Pt feels his eating habits have changed "since I don't have to get up at 6:30 am and go to school." Pt now gets up at 10:30 am and eats dinner around 5 pm. This Probation officer ? Pt re: food choices. Pt stated, "I ate hot dogs and mac and cheese last night because that's what my friend made." Pt ? If elevated CBG's due to Glipizide "discontinued a month or so ago." Pt reports he has an appointment with his MD today re: DM management.   Nutrition Diagnosis   Food-and nutrition-related knowledge deficit related to lack of exposure to information as related to diagnosis of: ? CVD ? DM (A1c 7.3)   Obesity related to excessive energy intake as evidenced by a BMI of 43.4  Nutrition RX/ Estimated Daily Nutrition Needs for: wt loss  1950-2350 Kcal, 50-65 gm fat, 12-16 gm sat fat, 18-24 gm trans-fat, <1500 mg sodium, 250-325 gm CHO   Nutrition Intervention   Pt's individual nutrition plan reviewed with pt.   ? If pt may benefit from increased dose of Lantus   Pt to attend the Portion Distortion class   Continue client-centered nutrition education by RD, as part of interdisciplinary care.  Goal(s)   Pt to describe the potential benefits of adopting Therapeutic Lifestyle Changes   Pt to identify food quantities necessary to achieve: ? wt loss to a goal wt of 248-266 lb (112.9-121.1 kg) at graduation from cardiac rehab.    Pt to describe the benefit of including fruits, vegetables, whole grains, and low-fat dairy products in a heart healthy meal plan.   CBG concentrations in the normal range or as close to normal as is  safely possible.  Monitor and Evaluate progress toward nutrition goal with team. Nutrition Risk: Moderate  Derek Mound, M.Ed, RD, LDN, CDE 01/27/2014 1:54 PM

## 2014-01-27 NOTE — Assessment & Plan Note (Signed)
Weight down a bit--encouraged to continue diet and exercise.  Refrain from alcohol use.

## 2014-01-27 NOTE — Assessment & Plan Note (Addendum)
Now back on correct regimen--continue this--and see where A1C is in 3 months.--previously at 7.3

## 2014-01-27 NOTE — Assessment & Plan Note (Signed)
Continues to use CPAP as directed.

## 2014-01-27 NOTE — Progress Notes (Signed)
    Subjective:    Patient ID: [name] [name] is a 53 y.o. male presenting with Diabetes  on 01/27/2014  HPI: Doing well--No complaints of chest pain.  Reports smoking less.  Reports roommate leaves stove on and passes out and smokes gets in apartment, this led to last AMI and stress at home.  He continues to work with cardiac rehab. He graduated from school and is returning there in the fall. Got a message from the RD that states he might need increase in Lantus, but he reports loss of Novolog as reason for loss of BS control.  Review of Systems  Constitutional: Negative for fever and chills.  Respiratory: Negative for chest tightness and shortness of breath.   Cardiovascular: Negative for leg swelling.  Gastrointestinal: Negative for nausea, vomiting, diarrhea and constipation.  Skin: Negative for rash.      Objective:    BP 114/77  Pulse 69  Temp(Src) 98.2 F (36.8 C) (Oral)  Wt 265 lb (120.203 kg) Physical Exam  Vitals reviewed. Constitutional: He is oriented to person, place, and time. No distress.  obese  HENT:  Head: Normocephalic and atraumatic.  Eyes: No scleral icterus.  Neck: Neck supple.  Cardiovascular: Normal rate.   Pulmonary/Chest: Effort normal.  Abdominal: Soft. There is no tenderness.  Musculoskeletal: Normal range of motion. He exhibits no tenderness.  Neurological: He is alert and oriented to person, place, and time.  Skin: Skin is warm and dry.  Psychiatric: He has a normal mood and affect.        Assessment & Plan:   HTN (hypertension) BP well controlled.  DM type 2 (diabetes mellitus, type 2) Now back on correct regimen--continue this--and see where A1C is in 3 months.--previously at 7.3  NSTEMI (non-ST elevated myocardial infarction) Continue cardiac rehab--smoking cessation re-iterated again.  OSA on CPAP Continues to use CPAP as directed.  Obesity Weight down a bit--encouraged to continue diet and exercise.  Refrain from alcohol  use.  Tobacco use Continue to counsel--education written and verbal provided today    Return in about 3 months (around 04/29/2014) for a follow-up.

## 2014-01-27 NOTE — Patient Instructions (Signed)
Smoking Cessation Quitting smoking is important to your health and has many advantages. However, it is not always easy to quit since nicotine is a very addictive drug. Often times, people try 3 times or more before being able to quit. This document explains the best ways for you to prepare to quit smoking. Quitting takes hard work and a lot of effort, but you can do it. ADVANTAGES OF QUITTING SMOKING  You will live longer, feel better, and live better.  Your body will feel the impact of quitting smoking almost immediately.  Within 20 minutes, blood pressure decreases. Your pulse returns to its normal level.  After 8 hours, carbon monoxide levels in the blood return to normal. Your oxygen level increases.  After 24 hours, the chance of having a heart attack starts to decrease. Your breath, hair, and body stop smelling like smoke.  After 48 hours, damaged nerve endings begin to recover. Your sense of taste and smell improve.  After 72 hours, the body is virtually free of nicotine. Your bronchial tubes relax and breathing becomes easier.  After 2 to 12 weeks, lungs can hold more air. Exercise becomes easier and circulation improves.  The risk of having a heart attack, stroke, cancer, or lung disease is greatly reduced.  After 1 year, the risk of coronary heart disease is cut in half.  After 5 years, the risk of stroke falls to the same as a nonsmoker.  After 10 years, the risk of lung cancer is cut in half and the risk of other cancers decreases significantly.  After 15 years, the risk of coronary heart disease drops, usually to the level of a nonsmoker.  If you are pregnant, quitting smoking will improve your chances of having a healthy baby.  The people you live with, especially any children, will be healthier.  You will have extra money to spend on things other than cigarettes. QUESTIONS TO THINK ABOUT BEFORE ATTEMPTING TO QUIT You may want to talk about your answers with your  caregiver.  Why do you want to quit?  If you tried to quit in the past, what helped and what did not?  What will be the most difficult situations for you after you quit? How will you plan to handle them?  Who can help you through the tough times? Your family? Friends? A caregiver?  What pleasures do you get from smoking? What ways can you still get pleasure if you quit? Here are some questions to ask your caregiver:  How can you help me to be successful at quitting?  What medicine do you think would be best for me and how should I take it?  What should I do if I need more help?  What is smoking withdrawal like? How can I get information on withdrawal? GET READY  Set a quit date.  Change your environment by getting rid of all cigarettes, ashtrays, matches, and lighters in your home, car, or work. Do not let people smoke in your home.  Review your past attempts to quit. Think about what worked and what did not. GET SUPPORT AND ENCOURAGEMENT You have a better chance of being successful if you have help. You can get support in many ways.  Tell your family, friends, and co-workers that you are going to quit and need their support. Ask them not to smoke around you.  Get individual, group, or telephone counseling and support. Programs are available at local hospitals and health centers. Call your local health department for   information about programs in your area.  Spiritual beliefs and practices may help some smokers quit.  Download a "quit meter" on your computer to keep track of quit statistics, such as how long you have gone without smoking, cigarettes not smoked, and money saved.  Get a self-help book about quitting smoking and staying off of tobacco. LEARN NEW SKILLS AND BEHAVIORS  Distract yourself from urges to smoke. Talk to someone, go for a walk, or occupy your time with a task.  Change your normal routine. Take a different route to work. Drink tea instead of coffee.  Eat breakfast in a different place.  Reduce your stress. Take a hot bath, exercise, or read a book.  Plan something enjoyable to do every day. Reward yourself for not smoking.  Explore interactive web-based programs that specialize in helping you quit. GET MEDICINE AND USE IT CORRECTLY Medicines can help you stop smoking and decrease the urge to smoke. Combining medicine with the above behavioral methods and support can greatly increase your chances of successfully quitting smoking.  Nicotine replacement therapy helps deliver nicotine to your body without the negative effects and risks of smoking. Nicotine replacement therapy includes nicotine gum, lozenges, inhalers, nasal sprays, and skin patches. Some may be available over-the-counter and others require a prescription.  Antidepressant medicine helps people abstain from smoking, but how this works is unknown. This medicine is available by prescription.  Nicotinic receptor partial agonist medicine simulates the effect of nicotine in your brain. This medicine is available by prescription. Ask your caregiver for advice about which medicines to use and how to use them based on your health history. Your caregiver will tell you what side effects to look out for if you choose to be on a medicine or therapy. Carefully read the information on the package. Do not use any other product containing nicotine while using a nicotine replacement product.  RELAPSE OR DIFFICULT SITUATIONS Most relapses occur within the first 3 months after quitting. Do not be discouraged if you start smoking again. Remember, most people try several times before finally quitting. You may have symptoms of withdrawal because your body is used to nicotine. You may crave cigarettes, be irritable, feel very hungry, cough often, get headaches, or have difficulty concentrating. The withdrawal symptoms are only temporary. They are strongest when you first quit, but they will go away within  10 14 days. To reduce the chances of relapse, try to:  Avoid drinking alcohol. Drinking lowers your chances of successfully quitting.  Reduce the amount of caffeine you consume. Once you quit smoking, the amount of caffeine in your body increases and can give you symptoms, such as a rapid heartbeat, sweating, and anxiety.  Avoid smokers because they can make you want to smoke.  Do not let weight gain distract you. Many smokers will gain weight when they quit, usually less than 10 pounds. Eat a healthy diet and stay active. You can always lose the weight gained after you quit.  Find ways to improve your mood other than smoking. FOR MORE INFORMATION  www.smokefree.gov  Document Released: 08/23/2001 Document Revised: 02/28/2012 Document Reviewed: 12/08/2011 [employer] Patient Information 2014 [employer].  

## 2014-01-27 NOTE — Telephone Encounter (Signed)
Pt with clinic appt today.  Will have form completed and given to MD [name]

## 2014-01-27 NOTE — Assessment & Plan Note (Signed)
Continue cardiac rehab--smoking cessation re-iterated again.

## 2014-01-27 NOTE — Assessment & Plan Note (Signed)
Continue to counsel--education written and verbal provided today

## 2014-01-27 NOTE — Assessment & Plan Note (Signed)
BP well controlled.

## 2014-01-28 ENCOUNTER — Other Ambulatory Visit: Payer: Self-pay | Admitting: *Deleted

## 2014-01-28 MED ORDER — INSULIN GLARGINE 100 UNIT/ML ~~LOC~~ SOLN: 30.0000 [IU] | Freq: Two times a day (BID) | SUBCUTANEOUS | Status: DC

## 2014-01-29 ENCOUNTER — Encounter (HOSPITAL_COMMUNITY)
Admission: RE | Admit: 2014-01-29 | Discharge: 2014-01-29 | Disposition: A | Discharge status: Home / Self Care | Payer: No Typology Code available for payment source | Source: Ambulatory Visit | Attending: Cardiology | Admitting: Cardiology

## 2014-01-29 ENCOUNTER — Telehealth: Payer: Self-pay

## 2014-01-29 LAB — GLUCOSE, CAPILLARY: GLUCOSE-CAPILLARY: 152 mg/dL — AB (ref 70–99)

## 2014-01-29 NOTE — Progress Notes (Signed)
Nutrition Note Spoke with pt. Pt states he neglected to tell this Probation officer that he was out of his Novolog for 8-9 days due to "I forgot to turn in my paperwork. I always forget to do that." During previous discussion, this writer asked pt about not giving himself either Novolog/ Lantus. Will monitor CBG's with RN and pt. Continue client-centered nutrition education by RD as part of interdisciplinary care.  Monitor and evaluate progress toward nutrition goal with team.  Derek Mound, M.Ed, RD, LDN, CDE 01/29/2014 1:31 PM

## 2014-01-29 NOTE — Telephone Encounter (Signed)
I left a message that his effient is here at the office

## 2014-01-30 ENCOUNTER — Ambulatory Visit: Payer: No Typology Code available for payment source | Admitting: Pharmacist

## 2014-01-31 ENCOUNTER — Encounter (HOSPITAL_COMMUNITY)
Admission: RE | Admit: 2014-01-31 | Discharge: 2014-01-31 | Disposition: A | Discharge status: Home / Self Care | Payer: No Typology Code available for payment source | Source: Ambulatory Visit | Attending: Cardiology | Admitting: Cardiology

## 2014-01-31 LAB — GLUCOSE, CAPILLARY: Glucose-Capillary: 226 mg/dL — ABNORMAL HIGH (ref 70–99)

## 2014-02-05 ENCOUNTER — Encounter (HOSPITAL_COMMUNITY): Payer: No Typology Code available for payment source

## 2014-02-07 ENCOUNTER — Encounter (HOSPITAL_COMMUNITY)
Admission: RE | Admit: 2014-02-07 | Discharge: 2014-02-07 | Disposition: A | Discharge status: Home / Self Care | Payer: No Typology Code available for payment source | Source: Ambulatory Visit | Attending: Cardiology | Admitting: Cardiology

## 2014-02-07 LAB — GLUCOSE, CAPILLARY: Glucose-Capillary: 219 mg/dL — ABNORMAL HIGH (ref 70–99)

## 2014-02-10 ENCOUNTER — Encounter (HOSPITAL_COMMUNITY)
Admission: RE | Admit: 2014-02-10 | Discharge: 2014-02-10 | Disposition: A | Discharge status: Home / Self Care | Payer: No Typology Code available for payment source | Source: Ambulatory Visit | Attending: Cardiology | Admitting: Cardiology

## 2014-02-10 DIAGNOSIS — E669 Obesity, unspecified: Secondary | ICD-10-CM | POA: Insufficient documentation

## 2014-02-10 DIAGNOSIS — I214 Non-ST elevation (NSTEMI) myocardial infarction: Secondary | ICD-10-CM | POA: Insufficient documentation

## 2014-02-10 DIAGNOSIS — Z5189 Encounter for other specified aftercare: Secondary | ICD-10-CM | POA: Insufficient documentation

## 2014-02-10 DIAGNOSIS — I10 Essential (primary) hypertension: Secondary | ICD-10-CM | POA: Insufficient documentation

## 2014-02-10 DIAGNOSIS — Z9861 Coronary angioplasty status: Secondary | ICD-10-CM | POA: Insufficient documentation

## 2014-02-10 DIAGNOSIS — F17200 Nicotine dependence, unspecified, uncomplicated: Secondary | ICD-10-CM | POA: Insufficient documentation

## 2014-02-10 DIAGNOSIS — E785 Hyperlipidemia, unspecified: Secondary | ICD-10-CM | POA: Insufficient documentation

## 2014-02-10 DIAGNOSIS — I2510 Atherosclerotic heart disease of native coronary artery without angina pectoris: Secondary | ICD-10-CM | POA: Insufficient documentation

## 2014-02-10 DIAGNOSIS — E119 Type 2 diabetes mellitus without complications: Secondary | ICD-10-CM | POA: Insufficient documentation

## 2014-02-10 LAB — GLUCOSE, CAPILLARY: GLUCOSE-CAPILLARY: 227 mg/dL — AB (ref 70–99)

## 2014-02-12 ENCOUNTER — Encounter (HOSPITAL_COMMUNITY): Payer: No Typology Code available for payment source

## 2014-02-12 ENCOUNTER — Telehealth (HOSPITAL_COMMUNITY): Payer: Self-pay | Admitting: Family Medicine

## 2014-02-17 ENCOUNTER — Encounter (HOSPITAL_COMMUNITY)
Admission: RE | Admit: 2014-02-17 | Discharge: 2014-02-17 | Disposition: A | Discharge status: Home / Self Care | Payer: No Typology Code available for payment source | Source: Ambulatory Visit | Attending: Cardiology | Admitting: Cardiology

## 2014-02-17 LAB — GLUCOSE, CAPILLARY: Glucose-Capillary: 229 mg/dL — ABNORMAL HIGH (ref 70–99)

## 2014-02-19 ENCOUNTER — Encounter (HOSPITAL_COMMUNITY)
Admission: RE | Admit: 2014-02-19 | Discharge: 2014-02-19 | Disposition: A | Discharge status: Home / Self Care | Payer: No Typology Code available for payment source | Source: Ambulatory Visit | Attending: Cardiology | Admitting: Cardiology

## 2014-02-19 LAB — GLUCOSE, CAPILLARY: Glucose-Capillary: 203 mg/dL — ABNORMAL HIGH (ref 70–99)

## 2014-02-20 ENCOUNTER — Telehealth: Payer: Self-pay

## 2014-02-20 NOTE — Telephone Encounter (Signed)
Called patient to pick up effient

## 2014-02-24 ENCOUNTER — Ambulatory Visit: Payer: No Typology Code available for payment source | Admitting: Pharmacist

## 2014-02-24 ENCOUNTER — Encounter (HOSPITAL_COMMUNITY): Admission: RE | Admit: 2014-02-24 | Payer: No Typology Code available for payment source | Source: Ambulatory Visit

## 2014-02-26 ENCOUNTER — Encounter (HOSPITAL_COMMUNITY)
Admission: RE | Admit: 2014-02-26 | Discharge: 2014-02-26 | Disposition: A | Discharge status: Home / Self Care | Payer: No Typology Code available for payment source | Source: Ambulatory Visit | Attending: Cardiology | Admitting: Cardiology

## 2014-02-28 NOTE — Progress Notes (Signed)
Pt arrived at cardiac rehab today however was not able to exercise due to end of his financial assistance agreement for cardiac rehab.  Pt med list reconciled.  Pt states he feels he is somewhat met his rehab goals with  improved  energy level and weight loss.  Pt states he currently has all of prescribed medications, taking them daily as prescribed and has access to refills including effient which he gets through patient assistance program.   Pt continues to struggle with psychosocial barriers of finances and continues to panhandle for income, pt also lacks family support system.  Pt has had frequent absences at rehab for various reasons, some health related, however others due to psychosocial conflicts.  Pt expresses desire to continue in program however unable to afford cardiac maintenance program.  Pt plans to exercise on his own walking. Pt verbalized understanding of importance of medication compliance and blood sugar control.

## 2014-03-03 ENCOUNTER — Other Ambulatory Visit: Payer: Self-pay | Admitting: *Deleted

## 2014-03-03 MED ORDER — INSULIN GLARGINE 100 UNIT/ML ~~LOC~~ SOLN: 30.0000 [IU] | Freq: Two times a day (BID) | SUBCUTANEOUS | Status: DC

## 2014-03-06 ENCOUNTER — Ambulatory Visit: Payer: No Typology Code available for payment source | Admitting: Pharmacist

## 2014-03-07 ENCOUNTER — Encounter: Payer: Self-pay | Admitting: Pharmacist

## 2014-03-07 ENCOUNTER — Ambulatory Visit (INDEPENDENT_AMBULATORY_CARE_PROVIDER_SITE_OTHER): Payer: No Typology Code available for payment source | Admitting: Pharmacist

## 2014-03-07 VITALS — BP 106/65 | HR 80 | Ht 66.5 in | Wt 261.0 lb

## 2014-03-07 DIAGNOSIS — E119 Type 2 diabetes mellitus without complications: Secondary | ICD-10-CM

## 2014-03-07 MED ORDER — INSULIN GLARGINE 100 UNIT/ML ~~LOC~~ SOLN: 27.0000 [IU] | Freq: Two times a day (BID) | SUBCUTANEOUS | Status: DC

## 2014-03-07 MED ORDER — MOMETASONE FURO-FORMOTEROL FUM 200-5 MCG/ACT IN AERO: 2.0000 | INHALATION_SPRAY | Freq: Two times a day (BID) | RESPIRATORY_TRACT | Status: DC

## 2014-03-07 MED ORDER — INSULIN ASPART 100 UNIT/ML ~~LOC~~ SOLN: 15.0000 [IU] | Freq: Three times a day (TID) | SUBCUTANEOUS | Status: DC

## 2014-03-07 NOTE — Patient Instructions (Addendum)
Thank you for coming in today!   We changed your insulin:  Lantus: 27 units twice a day Novolog: 15 units in the morning, 18 units at lunch and 18 units at dinner  We gave you [facility] for your breathing. Use 2 puffs twice a day every day.   We will send a prescription to the health department so you can get [facility] and albuterol from [facility].  Try to quit smoking and drinking between now and your next appointment.   Follow up with the pharmacy clinic in 3 weeks!

## 2014-03-07 NOTE — Assessment & Plan Note (Signed)
Diabetes currently uncontrolled but improved. Denies hypoglycemic events and is able to verbalize appropriate hypoglycemia management plan. Reports adherence with medication. Control is suboptimal due to diet and insufficient dose of Novolog.  Decreased dose of basal insulin Lantus (insulin glargine) to 27 units twice a day. Increased dose of rapid insulin Novolog (insulin aspart) to 15 units with breakfast, and 18 units with lunch and dinner. Consider initiating Vicotza (liraglutide) at the next visit to assist in weight loss and glucose control. Patient asked to bring glucometer to next visit. Congratulated patient on weight loss.  Next A1C anticipated in late August.   Chronic tobacco abuse with interest in cessation. Patient only endorses smoking when drinking. Commits to abstaining from drinking and smoking between now and the next office visit in 3 weeks.    Written patient instructions provided.  Follow up in Pharmacist Clinic Visit in 3 weeks.   Total time in face to face counseling 30 minutes.  Patient seen with Whitney Muse, PharmD Candidate and Wilfred Curtis PharmD Resident.

## 2014-03-07 NOTE — Progress Notes (Signed)
Patient ID: [name] [name], male   DOB: 1960/12/05, 53 y.o.   MRN: [id number] Reviewed: Agree with Dr. Graylin Shiver documentation and management.

## 2014-03-07 NOTE — Progress Notes (Signed)
S:    Patient arrives in good spirits walking at a brisk pace without signs of shortness of breath. Presents for diabetes, smoking cessation, and  COPD follow-up. He reports blood glucose reading of 150-280. He did not bring his meter to the appointment. He is still smoking when he drinks alcohol. He reports drinking twice a week and will smoke 10 or more cigarettes each time he drinks. He reports running out of Dulera 2 weeks ago but states the [place] made him feel better. He denies use of albuterol, and states he has not picked up the medication or used it in the past. He denies shortness of breath symptoms since the last visit.   O:  . Lab Results  Component Value Date   HGBA1C 9.4 01/27/2014     home fasting CBG readings of 154-221  2 hour post-prandial/random CBG readings of 200s.  A/P: Diabetes currently uncontrolled but improved. Denies hypoglycemic events and is able to verbalize appropriate hypoglycemia management plan. Reports adherence with medication. Control is suboptimal due to diet and insufficient dose of Novolog.  Decreased dose of basal insulin Lantus (insulin glargine) to 27 units twice a day. Increased dose of rapid insulin Novolog (insulin aspart) to 15 units with breakfast, and 18 units with lunch and dinner. Consider initiating Vicotza (liraglutide) at the next visit to assist in weight loss and glucose control. Patient asked to bring glucometer to next visit. Congratulated patient on weight loss.  Next A1C anticipated in late August.   Chronic tobacco abuse with interest in cessation. Patient only endorses smoking when drinking. Commits to abstaining from drinking and smoking between now and the next office visit in 3 weeks.    Written patient instructions provided.  Follow up in Pharmacist Clinic Visit in 3 weeks.   Total time in face to face counseling 30 minutes.  Patient seen with Whitney Muse, PharmD Candidate and Wilfred Curtis PharmD Resident.

## 2014-03-20 ENCOUNTER — Other Ambulatory Visit: Payer: Self-pay | Admitting: Family Medicine

## 2014-03-20 ENCOUNTER — Other Ambulatory Visit: Payer: Self-pay | Admitting: *Deleted

## 2014-03-20 DIAGNOSIS — E119 Type 2 diabetes mellitus without complications: Secondary | ICD-10-CM

## 2014-03-20 MED ORDER — INSULIN GLARGINE 100 UNIT/ML ~~LOC~~ SOLN
27.0000 [IU] | Freq: Two times a day (BID) | SUBCUTANEOUS | Status: DC
Start: 2014-03-20 — End: 2014-04-24

## 2014-03-20 MED ORDER — INSULIN GLARGINE 100 UNIT/ML ~~LOC~~ SOLN
27.0000 [IU] | Freq: Two times a day (BID) | SUBCUTANEOUS | Status: DC
Start: 2014-03-20 — End: 2014-03-20

## 2014-03-20 NOTE — Progress Notes (Signed)
Done. [name] [name]

## 2014-04-04 ENCOUNTER — Ambulatory Visit: Payer: No Typology Code available for payment source | Admitting: Pharmacist

## 2014-04-09 ENCOUNTER — Ambulatory Visit: Payer: No Typology Code available for payment source

## 2014-04-21 ENCOUNTER — Encounter: Payer: Self-pay | Admitting: Family Medicine

## 2014-04-21 ENCOUNTER — Ambulatory Visit (INDEPENDENT_AMBULATORY_CARE_PROVIDER_SITE_OTHER): Payer: No Typology Code available for payment source | Admitting: Family Medicine

## 2014-04-21 VITALS — BP 135/76 | HR 65 | Temp 98.1°F | Wt 267.0 lb

## 2014-04-21 DIAGNOSIS — F17200 Nicotine dependence, unspecified, uncomplicated: Secondary | ICD-10-CM

## 2014-04-21 DIAGNOSIS — Z720 Tobacco use: Secondary | ICD-10-CM

## 2014-04-21 DIAGNOSIS — E119 Type 2 diabetes mellitus without complications: Secondary | ICD-10-CM

## 2014-04-21 DIAGNOSIS — I10 Essential (primary) hypertension: Secondary | ICD-10-CM

## 2014-04-21 LAB — HM DIABETES EYE EXAM

## 2014-04-21 LAB — POCT GLYCOSYLATED HEMOGLOBIN (HGB A1C): Hemoglobin A1C: 11.2

## 2014-04-21 MED ORDER — LISINOPRIL 5 MG PO TABS: 2.5000 mg | ORAL_TABLET | Freq: Two times a day (BID) | ORAL | Status: DC

## 2014-04-21 MED ORDER — ATENOLOL 25 MG PO TABS: 25.0000 mg | ORAL_TABLET | Freq: Every day | ORAL | Status: DC

## 2014-04-21 MED ORDER — METFORMIN HCL 1000 MG PO TABS: 1000.0000 mg | ORAL_TABLET | Freq: Two times a day (BID) | ORAL | Status: DC

## 2014-04-21 NOTE — Assessment & Plan Note (Signed)
Must have better dietary control.  Will make referral to Nutritionist for diet and send back to Dr.Koval for further recommendations.

## 2014-04-21 NOTE — Patient Instructions (Signed)
Diabetes Mellitus and Food It is important for you to manage your blood sugar (glucose) level. Your blood glucose level can be greatly affected by what you eat. Eating healthier foods in the appropriate amounts throughout the day at about the same time each day will help you control your blood glucose level. It can also help slow or prevent worsening of your diabetes mellitus. Healthy eating may even help you improve the level of your blood pressure and reach or maintain a healthy weight.  HOW CAN FOOD AFFECT ME? Carbohydrates Carbohydrates affect your blood glucose level more than any other type of food. Your dietitian will help you determine how many carbohydrates to eat at each meal and teach you how to count carbohydrates. Counting carbohydrates is important to keep your blood glucose at a healthy level, especially if you are using insulin or taking certain medicines for diabetes mellitus. Alcohol Alcohol can cause sudden decreases in blood glucose (hypoglycemia), especially if you use insulin or take certain medicines for diabetes mellitus. Hypoglycemia can be a life-threatening condition. Symptoms of hypoglycemia (sleepiness, dizziness, and disorientation) are similar to symptoms of having too much alcohol.  If your health care provider has given you approval to drink alcohol, do so in moderation and use the following guidelines:  Women should not have more than one drink per day, and men should not have more than two drinks per day. One drink is equal to:  12 oz of beer.  5 oz of wine.  1 oz of hard liquor.  Do not drink on an empty stomach.  Keep yourself hydrated. Have water, diet soda, or unsweetened iced tea.  Regular soda, juice, and other mixers might contain a lot of carbohydrates and should be counted. WHAT FOODS ARE NOT RECOMMENDED? As you make food choices, it is important to remember that all foods are not the same. Some foods have fewer nutrients per serving than other  foods, even though they might have the same number of calories or carbohydrates. It is difficult to get your body what it needs when you eat foods with fewer nutrients. Examples of foods that you should avoid that are high in calories and carbohydrates but low in nutrients include:  Trans fats (most processed foods list trans fats on the Nutrition Facts label).  Regular soda.  Juice.  Candy.  Sweets, such as cake, pie, doughnuts, and cookies.  Fried foods. WHAT FOODS CAN I EAT? Have nutrient-rich foods, which will nourish your body and keep you healthy. The food you should eat also will depend on several factors, including:  The calories you need.  The medicines you take.  Your weight.  Your blood glucose level.  Your blood pressure level.  Your cholesterol level. You also should eat a variety of foods, including:  Protein, such as meat, poultry, fish, tofu, nuts, and seeds (lean animal proteins are best).  Fruits.  Vegetables.  Dairy products, such as milk, cheese, and yogurt (low fat is best).  Breads, grains, pasta, cereal, rice, and beans.  Fats such as olive oil, trans fat-free margarine, canola oil, avocado, and olives. DOES EVERYONE WITH DIABETES MELLITUS HAVE THE SAME MEAL PLAN? Because every person with diabetes mellitus is different, there is not one meal plan that works for everyone. It is very important that you meet with a dietitian who will help you create a meal plan that is just right for you. Document Released: 05/26/2005 Document Revised: 09/03/2013 Document Reviewed: 07/26/2013 ExitCare Patient Information 2015 [employer]. This   information is not intended to replace advice given to you by your health care provider. Make sure you discuss any questions you have with your health care provider. Smoking Cessation Quitting smoking is important to your health and has many advantages. However, it is not always easy to quit since nicotine is a very  addictive drug. Oftentimes, people try 3 times or more before being able to quit. This document explains the best ways for you to prepare to quit smoking. Quitting takes hard work and a lot of effort, but you can do it. ADVANTAGES OF QUITTING SMOKING  You will live longer, feel better, and live better.  Your body will feel the impact of quitting smoking almost immediately.  Within 20 minutes, blood pressure decreases. Your pulse returns to its normal level.  After 8 hours, carbon monoxide levels in the blood return to normal. Your oxygen level increases.  After 24 hours, the chance of having a heart attack starts to decrease. Your breath, hair, and body stop smelling like smoke.  After 48 hours, damaged nerve endings begin to recover. Your sense of taste and smell improve.  After 72 hours, the body is virtually free of nicotine. Your bronchial tubes relax and breathing becomes easier.  After 2 to 12 weeks, lungs can hold more air. Exercise becomes easier and circulation improves.  The risk of having a heart attack, stroke, cancer, or lung disease is greatly reduced.  After 1 year, the risk of coronary heart disease is cut in half.  After 5 years, the risk of stroke falls to the same as a nonsmoker.  After 10 years, the risk of lung cancer is cut in half and the risk of other cancers decreases significantly.  After 15 years, the risk of coronary heart disease drops, usually to the level of a nonsmoker.  If you are pregnant, quitting smoking will improve your chances of having a healthy baby.  The people you live with, especially any children, will be healthier.  You will have extra money to spend on things other than cigarettes. QUESTIONS TO THINK ABOUT BEFORE ATTEMPTING TO QUIT You may want to talk about your answers with your health care provider.  Why do you want to quit?  If you tried to quit in the past, what helped and what did not?  What will be the most difficult  situations for you after you quit? How will you plan to handle them?  Who can help you through the tough times? Your family? Friends? A health care provider?  What pleasures do you get from smoking? What ways can you still get pleasure if you quit? Here are some questions to ask your health care provider:  How can you help me to be successful at quitting?  What medicine do you think would be best for me and how should I take it?  What should I do if I need more help?  What is smoking withdrawal like? How can I get information on withdrawal? GET READY  Set a quit date.  Change your environment by getting rid of all cigarettes, ashtrays, matches, and lighters in your home, car, or work. Do not let people smoke in your home.  Review your past attempts to quit. Think about what worked and what did not. GET SUPPORT AND ENCOURAGEMENT You have a better chance of being successful if you have help. You can get support in many ways.  Tell your family, friends, and coworkers that you are going to quit and   need their support. Ask them not to smoke around you.  Get individual, group, or telephone counseling and support. Programs are available at local hospitals and health centers. Call your local health department for information about programs in your area.  Spiritual beliefs and practices may help some smokers quit.  Download a "quit meter" on your computer to keep track of quit statistics, such as how long you have gone without smoking, cigarettes not smoked, and money saved.  Get a self-help book about quitting smoking and staying off tobacco. LEARN NEW SKILLS AND BEHAVIORS  Distract yourself from urges to smoke. Talk to someone, go for a walk, or occupy your time with a task.  Change your normal routine. Take a different route to work. Drink tea instead of coffee. Eat breakfast in a different place.  Reduce your stress. Take a hot bath, exercise, or read a book.  Plan something  enjoyable to do every day. Reward yourself for not smoking.  Explore interactive web-based programs that specialize in helping you quit. GET MEDICINE AND USE IT CORRECTLY Medicines can help you stop smoking and decrease the urge to smoke. Combining medicine with the above behavioral methods and support can greatly increase your chances of successfully quitting smoking.  Nicotine replacement therapy helps deliver nicotine to your body without the negative effects and risks of smoking. Nicotine replacement therapy includes nicotine gum, lozenges, inhalers, nasal sprays, and skin patches. Some may be available over-the-counter and others require a prescription.  Antidepressant medicine helps people abstain from smoking, but how this works is unknown. This medicine is available by prescription.  Nicotinic receptor partial agonist medicine simulates the effect of nicotine in your brain. This medicine is available by prescription. Ask your health care provider for advice about which medicines to use and how to use them based on your health history. Your health care provider will tell you what side effects to look out for if you choose to be on a medicine or therapy. Carefully read the information on the package. Do not use any other product containing nicotine while using a nicotine replacement product.  RELAPSE OR DIFFICULT SITUATIONS Most relapses occur within the first 3 months after quitting. Do not be discouraged if you start smoking again. Remember, most people try several times before finally quitting. You may have symptoms of withdrawal because your body is used to nicotine. You may crave cigarettes, be irritable, feel very hungry, cough often, get headaches, or have difficulty concentrating. The withdrawal symptoms are only temporary. They are strongest when you first quit, but they will go away within 10-14 days. To reduce the chances of relapse, try to:  Avoid drinking alcohol. Drinking lowers  your chances of successfully quitting.  Reduce the amount of caffeine you consume. Once you quit smoking, the amount of caffeine in your body increases and can give you symptoms, such as a rapid heartbeat, sweating, and anxiety.  Avoid smokers because they can make you want to smoke.  Do not let weight gain distract you. Many smokers will gain weight when they quit, usually less than 10 pounds. Eat a healthy diet and stay active. You can always lose the weight gained after you quit.  Find ways to improve your mood other than smoking. FOR MORE INFORMATION  www.smokefree.gov  Document Released: 08/23/2001 Document Revised: 01/13/2014 Document Reviewed: 12/08/2011 ExitCare Patient Information 2015 [employer]. This information is not intended to replace advice given to you by your health care provider. Make sure you discuss any questions   you have with your health care provider.

## 2014-04-21 NOTE — Progress Notes (Signed)
    Subjective:    Patient ID: [name] [name] is a 53 y.o. male presenting with Diabetes, Letter for School/Work and Referral  on 04/21/2014  HPI: Here today for f/u.  HgbA1C is 11 today.  He reports poor diet this summer. Has hearing this fall.  He needs letter for disability.  Reports little alcohol intake.  Smoking is on and off.  Review of Systems    Objective:    BP 135/76  Pulse 65  Temp(Src) 98.1 F (36.7 C) (Oral)  Wt 267 lb (121.11 kg) Physical Exam  Vitals reviewed. Constitutional: He appears well-developed and well-nourished.  HENT:  Head: Normocephalic and atraumatic.  Eyes: No scleral icterus.  Neck: Neck supple.  Cardiovascular: Normal rate.   Pulmonary/Chest: Effort normal.  Abdominal: Soft.  Neurological: He is alert.  Skin: Skin is warm.  dry  Psychiatric: He has a normal mood and affect.        Assessment & Plan:   Problem List Items Addressed This Visit     Medium   HTN (hypertension) (Chronic)     Well controlled    Relevant Medications      lisinopril (PRINIVIL,ZESTRIL) tablet      atenolol (TENORMIN) tablet   DM type 2 (diabetes mellitus, type 2) - Primary     Must have better dietary control.  Will make referral to Nutritionist for diet and send back to Dr.Koval for further recommendations.    Relevant Medications      lisinopril (PRINIVIL,ZESTRIL) tablet      metFORMIN (GLUCOPHAGE) tablet   Other Relevant Orders      POCT A1C (Completed)      Ambulatory referral to diabetic education      Retinal/fundus photography   Tobacco use     Continue to work on quitting        Return in about 3 months (around 07/22/2014).

## 2014-04-21 NOTE — Assessment & Plan Note (Signed)
Well controlled 

## 2014-04-21 NOTE — Assessment & Plan Note (Signed)
Continue to work on quitting

## 2014-04-24 ENCOUNTER — Ambulatory Visit (INDEPENDENT_AMBULATORY_CARE_PROVIDER_SITE_OTHER): Payer: No Typology Code available for payment source | Admitting: Pharmacist

## 2014-04-24 VITALS — BP 111/59 | HR 68 | Ht 67.0 in | Wt 269.5 lb

## 2014-04-24 DIAGNOSIS — R0602 Shortness of breath: Secondary | ICD-10-CM

## 2014-04-24 DIAGNOSIS — E119 Type 2 diabetes mellitus without complications: Secondary | ICD-10-CM

## 2014-04-24 MED ORDER — MOMETASONE FURO-FORMOTEROL FUM 200-5 MCG/ACT IN AERO: 2.0000 | INHALATION_SPRAY | Freq: Two times a day (BID) | RESPIRATORY_TRACT | Status: DC

## 2014-04-24 MED ORDER — LIRAGLUTIDE 18 MG/3ML ~~LOC~~ SOPN: 0.6000 mg | PEN_INJECTOR | Freq: Every day | SUBCUTANEOUS | Status: DC

## 2014-04-24 NOTE — Patient Instructions (Addendum)
Please take 20 units of Novolog 3 times a day with meals.  Do not take it if you do not eat.   Increase Lantus to 30 units 2 times a day.  We are sending a prescription for Dulera and Victoza to [facility].  Please fill out the paperwork on both these medications on Monday or as soon as possible.   Please bring all your medications and meter to your next visit.

## 2014-04-25 ENCOUNTER — Encounter: Payer: Self-pay | Admitting: Pharmacist

## 2014-04-25 NOTE — Progress Notes (Signed)
S:    Patient arrives in good spirits.     Presents for diabetes and breathing follow-up. Patient had a relative die recently who had poorly controlled diabetes.   He reports taking his medications.     O:  . Lab Results  Component Value Date   HGBA1C 11.2 04/21/2014     home fasting CBG readings: unavailable Patient reports readings remain "high" ~300   A/P: Diabetes currently poorly controlled.   Denies hypoglycemic events and is able to verbalize appropriate hypoglycemia management plan.  Reports adherence with medication.  oal or next visit.  Control is suboptimal due to sedentary lifestyle and dietary indiscretion.  Increased dose of basal insulin Lantus (insulin glargine) from 27 to 30 units BID. Increased dose of rapid insulin Novolog (insulin aspart) from 18 to 20 units prior to each of three meals daily.   Patient verbalized understanding of insulin adjustments.   Additionally, asked patient to initiate a trial of Victoza (liraglutide) with dosing to begin after he can obtain from [facility] at [place].   Plan to titrate this medication to dose of 1.8 mg daily.     Shortness of breath improved with use of Dulera (mometasone/formoterol).  Provided additional sample AND asked patient to obtain [facility] supply from [place] [facility].  Chronic nicotine abuse continues to be contemplative RE quit attempt in the near future.  Encouraged him to consider quitting.    Written patient instructions provided.  Follow up in Pharmacist Clinic Visit 3 weeks.  Total time in face to face counseling 40 minutes.  Patient seen with Kurtis Bushman, PharmD Candidate;  Soyla Dryer,  PharmD Resident.

## 2014-04-25 NOTE — Assessment & Plan Note (Signed)
Shortness of breath improved with use of Dulera (mometasone/formoterol).  Provided additional sample AND asked patient to obtain [facility] supply from [place] [facility].  Chronic nicotine abuse continues to be contemplative RE quit attempt in the near future.  Encouraged him to consider quitting.

## 2014-04-25 NOTE — Progress Notes (Signed)
Patient ID: [name] [name], male   DOB: Mar 04, 1961, 53 y.o.   MRN: [id number] Reviewed: Agree with Dr. Graylin Shiver documentation and management.

## 2014-04-25 NOTE — Assessment & Plan Note (Signed)
Diabetes currently poorly controlled.   Denies hypoglycemic events and is able to verbalize appropriate hypoglycemia management plan.  Reports adherence with medication.  oal or next visit.  Control is suboptimal due to sedentary lifestyle and dietary indiscretion.  Increased dose of basal insulin Lantus (insulin glargine) from 27 to 30 units BID. Increased dose of rapid insulin Novolog (insulin aspart) from 18 to 20 units prior to each of three meals daily.   Patient verbalized understanding of insulin adjustments.   Additionally, asked patient to initiate a trial of Victoza (liraglutide) with dosing to begin after he can obtain from [facility] at [place].   Plan to titrate this medication to dose of 1.8 mg daily.

## 2014-04-29 ENCOUNTER — Ambulatory Visit: Payer: No Typology Code available for payment source | Admitting: *Deleted

## 2014-05-03 IMAGING — CR DG FOOT COMPLETE 3+V*L*
3 series · 3 of 3 positions shown · non-contrast
Comparison: None.

CLINICAL DATA: 51-year-old male with diabetes.  Foot swelling, pain
and ulcers.

LEFT FOOT - COMPLETE 3+ VIEW

[x foot ap left]
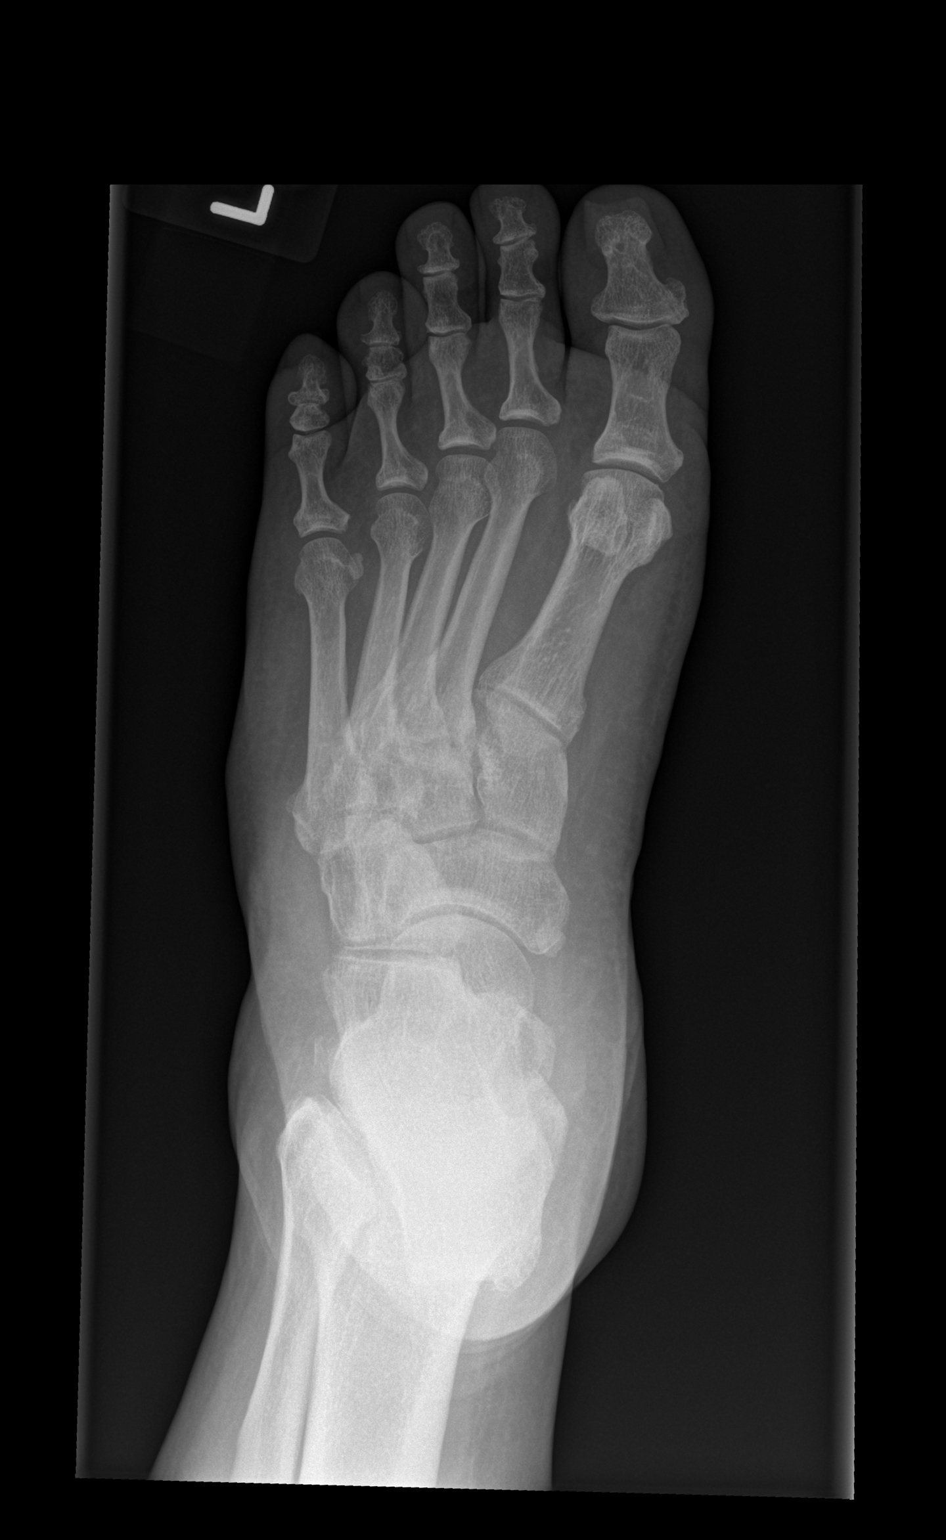

[x foot obl left]
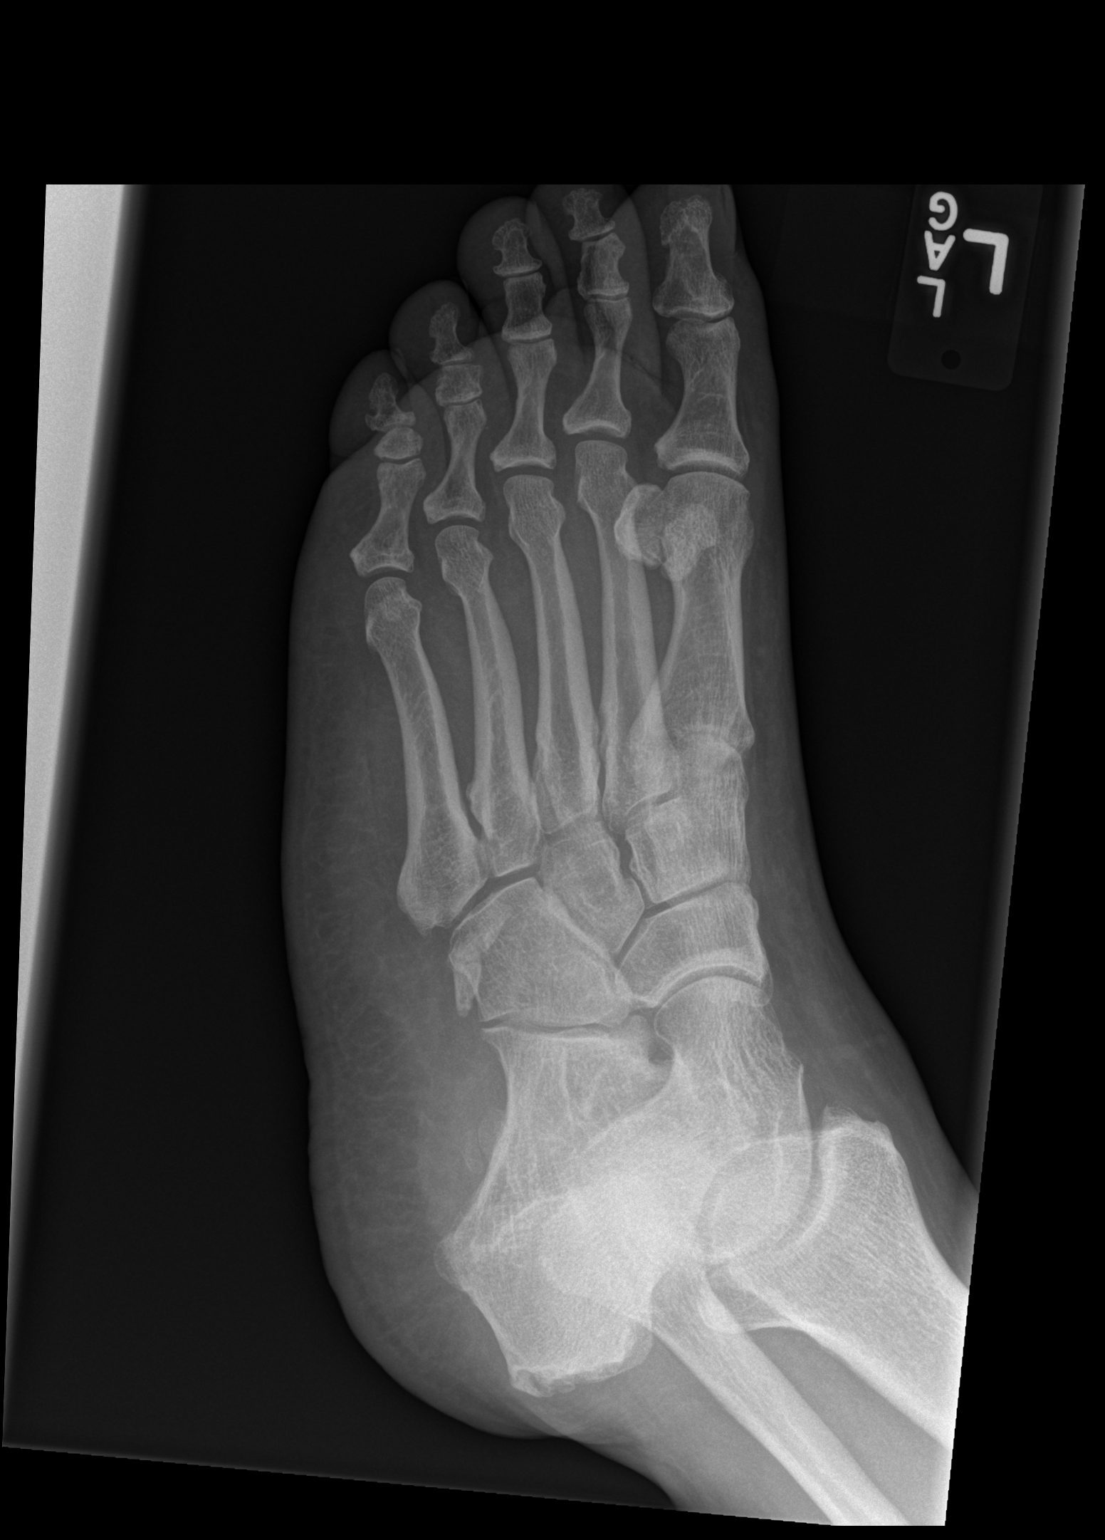

[x foot lat left]
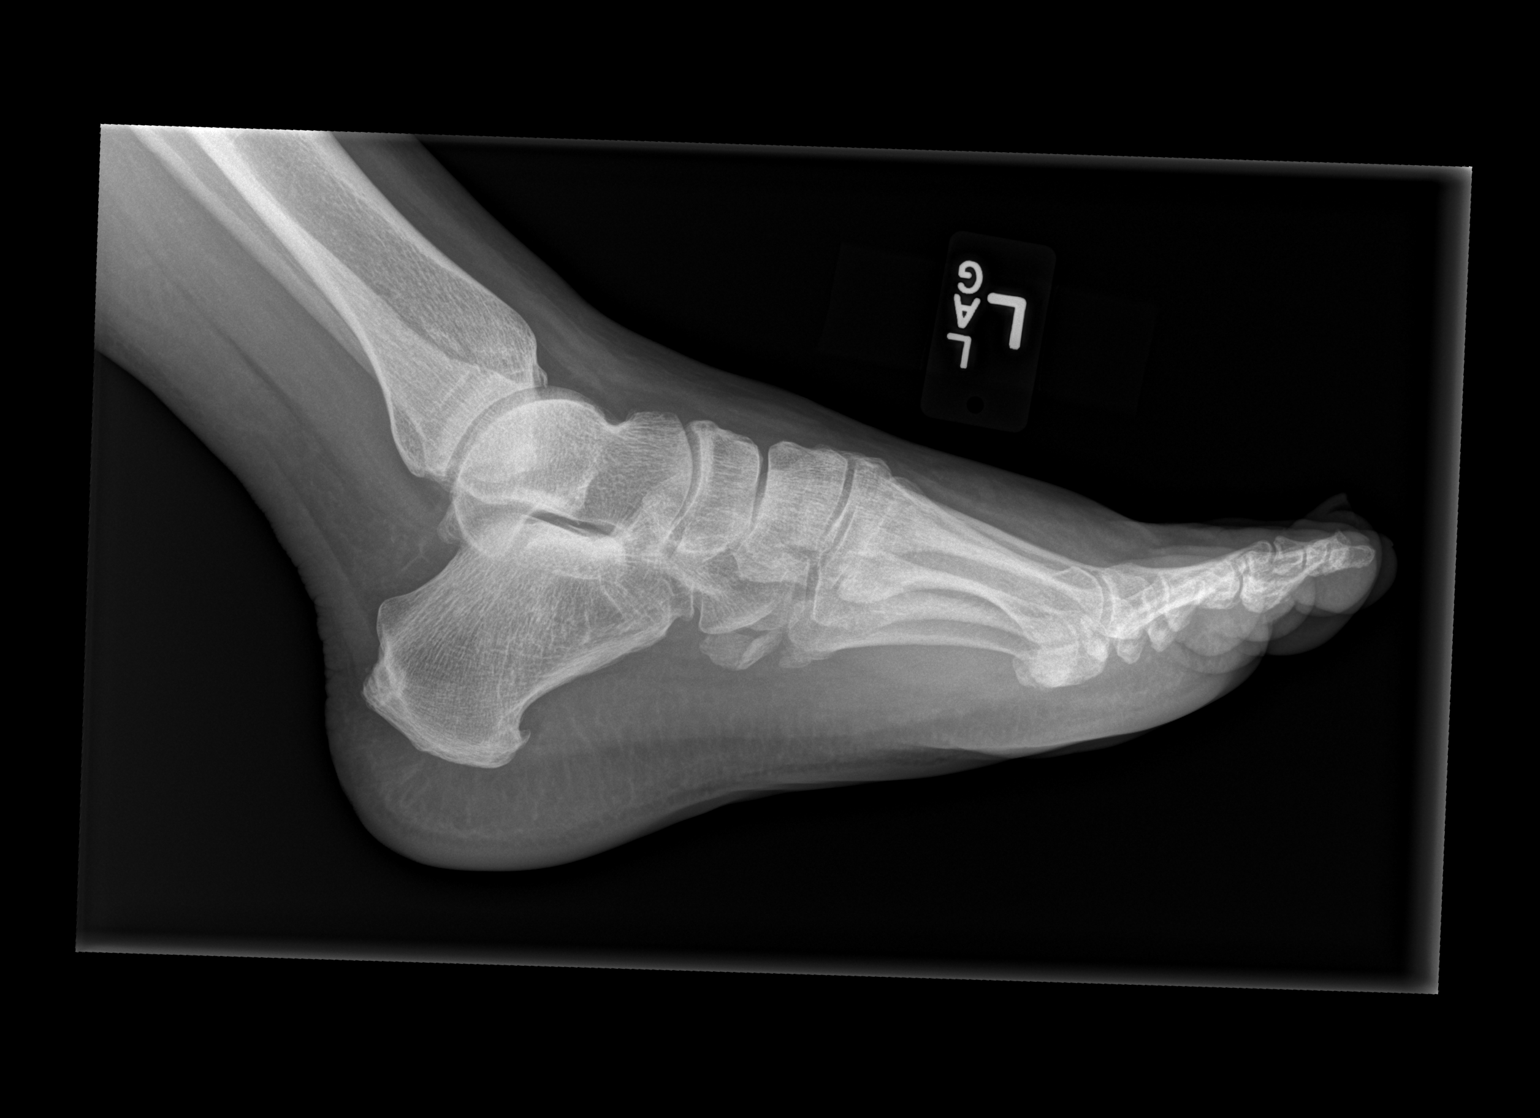

[3 of 3 positions shown; findings below may reference images not displayed]

FINDINGS: Bone mineralization is within normal limits.  Calcaneus
intact.  Accessory or chronic ossicle adjacent to the cuboid.  No
subcutaneous gas.  No fracture or dislocation.  No cortical
osteolysis.
IMPRESSION: No acute osseous abnormality identified about the left foot.

## 2014-05-05 ENCOUNTER — Ambulatory Visit: Payer: No Typology Code available for payment source | Admitting: *Deleted

## 2014-05-13 ENCOUNTER — Encounter: Payer: Self-pay | Admitting: Family Medicine

## 2014-05-13 ENCOUNTER — Telehealth: Payer: Self-pay | Admitting: Family Medicine

## 2014-05-13 NOTE — Progress Notes (Unsigned)
Pt came by to drop off form to be filled out regarding his disability hearing, contact number [phone].

## 2014-05-13 NOTE — Telephone Encounter (Signed)
Pt called and would like [name] [name] to call him at [phone]. He said that it is reference to the papers that were dropped off this morning. jw

## 2014-05-13 NOTE — Progress Notes (Unsigned)
This form should actually be completed by pt, but knowing pt and his disabilities have completed as much as I can .  Will placed in MD box for review. Fleeger, Salome Spotted

## 2014-05-13 NOTE — Telephone Encounter (Signed)
Advised to patient that, although this was something that he would be able to complete because it did not ask for a signature, I was able to complete some of the form, but had to place it in Dr. Blane Ohara office.  He would like a copy of what we have so far.  He has appt with Dr. Valentina Lucks Thursday morning.  He will pick up copy then. Jeyla Bulger, Salome Spotted

## 2014-05-15 ENCOUNTER — Encounter: Payer: Self-pay | Admitting: Pharmacist

## 2014-05-15 ENCOUNTER — Ambulatory Visit (INDEPENDENT_AMBULATORY_CARE_PROVIDER_SITE_OTHER): Payer: No Typology Code available for payment source | Admitting: Pharmacist

## 2014-05-15 VITALS — BP 101/72 | HR 131 | Ht 67.0 in | Wt 270.9 lb

## 2014-05-15 DIAGNOSIS — E119 Type 2 diabetes mellitus without complications: Secondary | ICD-10-CM

## 2014-05-15 DIAGNOSIS — F17200 Nicotine dependence, unspecified, uncomplicated: Secondary | ICD-10-CM

## 2014-05-15 DIAGNOSIS — Z720 Tobacco use: Secondary | ICD-10-CM

## 2014-05-15 DIAGNOSIS — R0602 Shortness of breath: Secondary | ICD-10-CM

## 2014-05-15 MED ORDER — MOMETASONE FURO-FORMOTEROL FUM 200-5 MCG/ACT IN AERO: 2.0000 | INHALATION_SPRAY | Freq: Two times a day (BID) | RESPIRATORY_TRACT | Status: DC

## 2014-05-15 MED ORDER — "INSULIN SYRINGE-NEEDLE U-100 31G X 5/16"" 1 ML MISC": 1.0000 [IU] | Status: DC

## 2014-05-15 MED ORDER — LIRAGLUTIDE 18 MG/3ML ~~LOC~~ SOPN: 1.8000 mg | PEN_INJECTOR | Freq: Every day | SUBCUTANEOUS | Status: DC

## 2014-05-15 NOTE — Progress Notes (Signed)
Patient ID: [name] [name], male   DOB: 09-01-1961, 53 y.o.   MRN: [id number] Reviewed: Agree with the documentation and management of Dr. Valentina Lucks

## 2014-05-15 NOTE — Assessment & Plan Note (Signed)
Shortness of breath improved with use of Dulera and minimization of tobacco abuse (quit for 3 days).   Encouraged continued abstinence from tobacco smoking and provided samples of Dulera (2 inhalers) for use until he is able to obtain supply from the [facility] program.   Faxed new prescription orders for Dulera and Victoza to the [place] [facility] program.

## 2014-05-15 NOTE — Patient Instructions (Addendum)
Increasing Lantus to 35 units twice daily Continue taking 20 units three times daily with meals of the Novolog Patient to get Victoza (liraglutide) and Dulera from [facility] and call once receives Follow up with Rx clnic in 3 weeks

## 2014-05-15 NOTE — Assessment & Plan Note (Signed)
Diabetes longstanding currently remains poorly controlled on multiple shot per day insulin regimen. Denies hypoglycemic events and is able to verbalize appropriate hypoglycemia management plan.  Reports adherence with medication.   Control is suboptimal due to dietary indiscretion AND sedentary lifestyle.  He has NOT yet obtained [facility] supply of Victoza.   Increased dose of basal insulin Lantus (insulin glargine) to 35 units twice daily. Continued rapid insulin Novolog (insulin aspart) at the same dose of 20 units prior to each meal.    Added Victoza (liraglutide) which is to be started when the supply returns from [facility].   Patient will bring this medicine to the office for education at the time of availability.   Next A1C anticipated 2-3 months.  Written patient instructions provided.  Follow up in Pharmacist Clinic Visit 3-4 weeks.  Total time in face to face counseling 35 minutes.  Patient seen with Gloriajean Dell, PharmD Candidate;  Fuller Canada,  PharmD Resident.

## 2014-05-15 NOTE — Assessment & Plan Note (Addendum)
Shortness of breath improved with use of Dulera and minimization of tobacco abuse (quit for 3 days).   Encouraged continued abstinence from tobacco smoking and provided samples of Dulera (2 inhalers) for use until he is able to obtain supply from the [facility] program.   Faxed new prescription orders for Dulera and Victoza to the [place] [facility] program.

## 2014-05-15 NOTE — Progress Notes (Signed)
S:    Patient arrives in good spirits.   He states he has returned to class and has NOT smoked for 3 days.   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  Presents for diabetes follow-up.   He brings his blood glucose meter to the visit with 5 readings in the 3 weeks (all within the last week).   Patient reports adherence to his medication regimen.   He did NOT complete the [facility] application for Victoza or Dulera since his last visit.   He is willing to go and complete this paperwork.   O:  Lab Results  Component Value Date   HGBA1C 11.2 04/21/2014    home fasting CBG readings of 175-376  (avg 200-300)   5 total readings in last three weeks.  A/P: Diabetes longstanding currently remains poorly controlled on multiple shot per day insulin regimen. Denies hypoglycemic events and is able to verbalize appropriate hypoglycemia management plan.  Reports adherence with medication.   Control is suboptimal due to dietary indiscretion AND sedentary lifestyle.  He has NOT yet obtained [facility] supply of Victoza.   Increased dose of basal insulin Lantus (insulin glargine) to 35 units twice daily. Continued rapid insulin Novolog (insulin aspart) at the same dose of 20 units prior to each meal.    Added Victoza (liraglutide) which is to be started when the supply returns from [facility].   Patient will bring this medicine to the office for education at the time of availability.   Next A1C anticipated 2-3 months.  Written patient instructions provided.  Follow up in Pharmacist Clinic Visit 3-4 weeks.  Total time in face to face counseling 35 minutes.  Patient seen with Gloriajean Dell, PharmD Candidate;  Fuller Canada,  PharmD Resident.  Shortness of breath improved with use of Dulera and minimization of tobacco abuse (quit for 3 days).   Encouraged continued abstinence from tobacco smoking and provided samples of Dulera (2 inhalers) for use until he is able to obtain supply from the [facility]  program.   Faxed new prescription orders for Dulera and Victoza to the [place] [facility].

## 2014-05-26 NOTE — Telephone Encounter (Signed)
LMOVM informing pt there was nothing else we could fill out and he could come pick up at front desk. Fleeger, Salome Spotted

## 2014-06-09 ENCOUNTER — Encounter: Payer: Self-pay | Admitting: Cardiology

## 2014-06-09 ENCOUNTER — Ambulatory Visit (INDEPENDENT_AMBULATORY_CARE_PROVIDER_SITE_OTHER): Payer: No Typology Code available for payment source | Admitting: Cardiology

## 2014-06-09 VITALS — BP 100/68 | HR 72 | Ht 67.0 in | Wt 273.0 lb

## 2014-06-09 DIAGNOSIS — I10 Essential (primary) hypertension: Secondary | ICD-10-CM

## 2014-06-09 DIAGNOSIS — R079 Chest pain, unspecified: Secondary | ICD-10-CM

## 2014-06-09 MED ORDER — NITROGLYCERIN 0.4 MG SL SUBL: 0.4000 mg | SUBLINGUAL_TABLET | SUBLINGUAL | Status: DC | PRN

## 2014-06-09 NOTE — Patient Instructions (Addendum)
Your physician recommends that you continue on your current medications as directed. Please refer to the Current Medication list given to you today. I called a refill in for your Nitroglycerin. I asked the Pharmacy to call me if they had any issues filling the RX  Your physician has requested that you have an exercise stress myoview. For further information please visit [email or web address]. Please follow instruction sheet, as given.   Your physician recommends that you schedule a follow-up appointment after Stress Test with Dr Martinique or a NP/PA

## 2014-06-09 NOTE — Progress Notes (Signed)
Patient ID: [name] [name], male   DOB: 05/06/1961, 53 y.o.   MRN: [id number]    06/09/2014 [name] [name]   [birth year]  [id number]  Primary Physician: [name], MD Primary Cardiologist: [name]  HPI:  The patient is a 53 y/o male, followed by [name]. His past medical history is significant for CAD, poorly controlled diabetes with most recent hemoglobin A1c of 11.2, obesity, tobacco use (1/2 ppd), hyperlipidemia and hypertension. He presents to clinic today for followup. He  is status post stenting of the right coronary in May of 2013. He was recently admitted with a NSTEMI in  February of this year and was found to have severe disease in the LCx. This was treated with a BMS. He was placed on dual antiplatelet therapy with aspirin plus Effient.   Today in clinic, he reports that he continues to have intermittent substernal chest pressure that occurs both at rest and with physical activity. He frequently uses sublingual nitroglycerin and his symptoms resolve almost immediately. He reports that he experiences symptoms almost once every other week. He states that he is close to running out of his sublingual nitroglycerin and only has 2 tablets remaining. His episodes are also accompanied by mild dyspnea and diaphoresis. No syncope/near-syncope. He reports that he has been fully compliant with his medications daily including dual antiplatelet therapy without fail. Unfortunately, he continues to smoke about half a pack per day.  His blood pressure in clinic today is well-controlled at 100/68. Pulse rate is 72. His EKG demonstrates normal sinus rhythm with nonspecific T-wave abnormalities, however compared to prior EKGs there are no significant changes.    Current Outpatient Prescriptions  Medication Sig Dispense Refill  . ACCU-CHEK FASTCLIX LANCETS MISC 1 Units by Percutaneous route 4 (four) times daily.  100 each  12  . albuterol (PROVENTIL HFA;VENTOLIN HFA) 108 (90 BASE) MCG/ACT inhaler Inhale 2  puffs into the lungs every 6 (six) hours as needed for wheezing or shortness of breath.  1 Inhaler  11  . aspirin 81 MG chewable tablet Chew 1 tablet (81 mg total) by mouth daily.  180 tablet  2  . atenolol (TENORMIN) 25 MG tablet Take 1 tablet (25 mg total) by mouth daily.  30 tablet  11  . insulin aspart (NOVOLOG) 100 UNIT/ML injection Inject 20 Units into the skin 3 (three) times daily with meals.      . insulin glargine (LANTUS) 100 UNIT/ML injection Inject 30 Units into the skin 2 (two) times daily.       . Insulin Syringe-Needle U-100 (FREESTYLE PRECISION INS SYR) 31G X 5/16" 1 ML MISC 1 Units by Does not apply route as directed. Dx: Diabetes 250.02.  Dispense:  QS for 5 shots daily - 1 month supply  100 each  prn  . Liraglutide 18 MG/3ML SOPN Inject 1.8 mg into the skin daily. Dispense QS for 1 month supply  3 pen  11  . lisinopril (PRINIVIL,ZESTRIL) 5 MG tablet Take 0.5 tablets (2.5 mg total) by mouth 2 (two) times daily.  60 tablet  11  . metFORMIN (GLUCOPHAGE) 1000 MG tablet Take 1 tablet (1,000 mg total) by mouth 2 (two) times daily with a meal.  60 tablet  11  . mometasone-formoterol (DULERA) 200-5 MCG/ACT AERO Inhale 2 puffs into the lungs 2 (two) times daily. Dispense QS for 1 month supply.  2 Inhaler  0  . nitroGLYCERIN (NITROSTAT) 0.4 MG SL tablet Place 1 tablet (0.4 mg total) under the tongue every 5 (  five) minutes x 3 doses as needed for chest pain.  25 tablet  0  . Omega-3 Fatty Acids (FISH OIL PO) Take 1 capsule by mouth 2 (two) times daily.       . prasugrel (EFFIENT) 10 MG TABS tablet Take 1 tablet (10 mg total) by mouth daily.  30 tablet  0  . simvastatin (ZOCOR) 20 MG tablet Take 1 tablet (20 mg total) by mouth every evening.  30 tablet  11  . traMADol (ULTRAM) 50 MG tablet Take 50 mg by mouth every 6 (six) hours as needed for severe pain.       No current facility-administered medications for this visit.    Allergies  Allergen Reactions  . Shellfish Allergy Nausea And  Vomiting    History   Social History  . Marital Status: Single    Spouse Name: N/A    Number of Children: 0  . Years of Education: 15   Occupational History  . Unemployed     Used to work for [employer]  . 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 Smoking status: Current Some Day Smoker -- 0.50 packs/day for 35 years    Types: Cigarettes  . Smokeless tobacco: Never Used  . Alcohol Use: 14.4 oz/week    24 Cans of beer per week     Comment: 11/07/2013 "12 beers twice a week"  . Drug Use: No  . Sexual Activity: Not Currently   Other Topics Concern  . Not on file   Social History Narrative   Single.  Lives with a roommate.  Ambulates independently.     Review of Systems: General: negative for chills, fever, night sweats or weight changes.  Cardiovascular: negative for chest pain, dyspnea on exertion, edema, orthopnea, palpitations, paroxysmal nocturnal dyspnea or shortness of breath Dermatological: negative for rash Respiratory: negative for cough or wheezing Urologic: negative for hematuria Abdominal: negative for nausea, vomiting, diarrhea, bright red blood per rectum, melena, or hematemesis Neurologic: negative for visual changes, syncope, or dizziness All other systems reviewed and are otherwise negative except as noted above.    Height 5\' 7"  (1.702 m), weight 273 lb (123.832 kg).  General appearance: alert, cooperative and no distress Neck: no carotid bruit and no JVD Lungs: clear to auscultation bilaterally Heart: regular rate and rhythm, S1, S2 normal, no murmur, click, rub or gallop Extremities: no LEE Pulses: 2+ and symmetric Skin: warm and dry Neurologic: Grossly normal  EKG NSR with Nonspecific Twave abnormalities . 72 bpm.  ASSESSMENT AND PLAN:   1. CAD: Patient with prior stenting to the RCA and left circumflex. Also with ongoing tobacco abuse and poorly controlled diabetes. He has experienced recurrent anginal symptoms,  relieved with sublingual nitroglycerin. I am hesitant to place him on long acting oral nitrate therapy due to his soft blood pressure with systolic pressure at 875. For now, I recommended that we continue him on his current medical regimen including his beta blocker, ACE inhibitor, aspirin and Effient and only use sublingual nitroglycerin as needed. Given his recurrent symptoms and significant risk factors that place him at risk for progressive disease, I have recommended that we have him undergo a stress test to assess for ischemia. If significantly abnormal he will require repeat cardiac catheterization.  2. hypertension: Soft but stable. Continue 25 mg of atenolol and 5 mg of lisinopril.  3. DM: Poorly controlled. His last hemoglobin A1c 04/2014 was severely elevated at 11.5. This is managed  by his PCP. He is on an ACE inhibitor for renal protection.  4. Tobacco abuse: Smoking cessation was strongly advised  5. Hyperlipidemia: Continue simvastatin.  PLAN  Continue current medical regimen including dual platelet therapy with aspirin plus Effient, beta blocker and ACE inhibitor therapy along with PRN sublingual nitroglycerin. Continue management of diabetes per PCP. We will have him undergo an exercise nuclear stress test to assess for perfusion abnormalities that would suggest ischemia. If abnormal stud,y will refer for repeat cardiac catheterization. F/U with either [name] on extender after stress test is completed.  SIMMONS, BRITTAINYPA-C 06/09/2014 4:10 PM

## 2014-06-17 ENCOUNTER — Encounter: Payer: No Typology Code available for payment source | Attending: Family Medicine | Admitting: *Deleted

## 2014-06-17 VITALS — Ht 68.0 in | Wt 270.3 lb

## 2014-06-17 DIAGNOSIS — E119 Type 2 diabetes mellitus without complications: Secondary | ICD-10-CM

## 2014-06-17 DIAGNOSIS — Z713 Dietary counseling and surveillance: Secondary | ICD-10-CM | POA: Insufficient documentation

## 2014-06-17 NOTE — Patient Instructions (Addendum)
Snacks: 1/2 candy bar save the rest for tomorrow Eat fruit cup but not the syrup Eat only 1/2 of the cookie Consider taking the top off of you BUN Be careful of corn, peas,  Don't eat more than three carbohydrates per meal

## 2014-06-18 ENCOUNTER — Encounter: Payer: Self-pay | Admitting: *Deleted

## 2014-06-18 NOTE — Progress Notes (Signed)
Diabetes Self-Management Education  Visit Type:DSME    Appt. Start Time: 1530 Appt. End Time: 1700  06/18/2014  [name], identified by name and date of birth, is a 53 y.o. male with a diagnosis of Diabetes: Type 2.  Other people present during visit:  Patient   ASSESSMENT  Height 5\' 8"  (1.727 m), weight 270 lb 4.8 oz (122.607 kg). Body mass index is 41.11 kg/(m^2).  Initial Visit Information:  Are you currently following a meal plan?: No   Are you taking your medications as prescribed?: Yes (miss rarely) Are you checking your feet?: No     What is the last grade level you completed in school?: in community college now  Psychosocial:   Patient Belief/Attitude about Diabetes: Motivated to manage diabetes Self-care barriers: Low literacy;Lack of transportation Self-management support: Doctor's office;CDE visits Other persons present: Patient Patient Concerns: Nutrition/Meal planning Special Needs: Simplified materials Preferred Learning Style: No preference indicated Learning Readiness: Ready  Complications:   How often do you check your blood sugar?:  (2-3 times per week, tired, forget) Fasting Blood glucose range (mg/dL): >200;130-179 Number of hypoglycemic episodes per month: 0 Number of hyperglycemic episodes per week: 0 Have you had a dilated eye exam in the past 12 months?: Yes Have you had a dental exam in the past 12 months?: No  Diet Intake:  Breakfast: peanut butter/jelly sandwich, eggs, sausage, biscuit & gravy Snack (morning): balogna cheese sandwich, turkey/chicken sandwich, diet soda Lunch: cheese burger, grilled chicken, mashed potatoes, cookie, ice cream, candy bar Dinner: stoffers dinner, meat, mashed potatoes / burger, macaroni cheese / steak Beverage(s): diet pepsi, water, 2% milk, rare sweet tea  Exercise:  Exercise: Light (walking / raking leaves)  Individualized Plan for Diabetes Self-Management Training:   Learning Objective:   Patient will have a greater understanding of diabetes self-management.  Patient education plan per assessed needs and concerns is to attend individual sessions for Diabetes Self Management Education.     Education Topics Reviewed with Patient Today:  Definition of diabetes, type 1 and 2, and the diagnosis of diabetes Role of diet in the treatment of diabetes and the relationship between the three main macronutrients and blood glucose level;Meal timing in regards to the patients' current diabetes medication.;Information on hints to eating out and maintain blood glucose control.;Meal options for control of blood glucose level and chronic complications. Role of exercise on diabetes management, blood pressure control and cardiac health.;Helped patient identify appropriate exercises in relation to his/her diabetes, diabetes complications and other health issue.   Purpose and frequency of SMBG.;Yearly dilated eye exam;Daily foot exams;Identified appropriate SMBG and/or A1C goals.   Relationship between chronic complications and blood glucose control;Dental care;Retinopathy and reason for yearly dilated eye exams;Assessed and discussed foot care and prevention of foot problems Helped patient identify a support system for diabetes management (Patient noted that he graduated from [employer] and has been at the [employer] for 5 yrs.)   Lifestyle issues that need to be addressed for better diabetes care  PATIENTS GOALS/Plan (Developed by the patient):  Nutrition: Follow meal plan discussed Physical Activity: Exercise 1-2 times per week;15 minutes per day Medications: take my medication as prescribed Monitoring : test my blood glucose as discussed (note x per day with comment) Reducing Risk: do foot checks daily  Plan:   Patient Instructions  Snacks: 1/2 candy bar save the rest for tomorrow Eat fruit cup but not the syrup Eat only 1/2 of the cookie Consider taking the top off of  you  BUN Be careful of corn, peas,  Don't eat more than three carbohydrates per meal   Expected Outcomes:  Other (very engaged in conversation, felt that he could make some minor changes as recommended. However,  I expect minimal to no changes in behavior)  Education material provided: Living Well with Diabetes, A1C conversion sheet, Snack sheet and Carbohydrate counting sheet  If problems or questions, patient to contact team via:  Phone  Future DSME appointment: PRN

## 2014-06-20 ENCOUNTER — Encounter (HOSPITAL_COMMUNITY): Payer: No Typology Code available for payment source

## 2014-06-26 ENCOUNTER — Encounter: Payer: Self-pay | Admitting: Pharmacist

## 2014-06-26 ENCOUNTER — Ambulatory Visit (INDEPENDENT_AMBULATORY_CARE_PROVIDER_SITE_OTHER): Payer: No Typology Code available for payment source | Admitting: Pharmacist

## 2014-06-26 VITALS — BP 121/86 | HR 68 | Ht 68.0 in | Wt 268.6 lb

## 2014-06-26 DIAGNOSIS — I10 Essential (primary) hypertension: Secondary | ICD-10-CM

## 2014-06-26 DIAGNOSIS — Z720 Tobacco use: Secondary | ICD-10-CM

## 2014-06-26 DIAGNOSIS — E119 Type 2 diabetes mellitus without complications: Secondary | ICD-10-CM

## 2014-06-26 DIAGNOSIS — R0602 Shortness of breath: Secondary | ICD-10-CM

## 2014-06-26 DIAGNOSIS — E785 Hyperlipidemia, unspecified: Secondary | ICD-10-CM

## 2014-06-26 MED ORDER — METFORMIN HCL 1000 MG PO TABS: 1000.0000 mg | ORAL_TABLET | Freq: Two times a day (BID) | ORAL | Status: DC

## 2014-06-26 MED ORDER — ALBUTEROL SULFATE HFA 108 (90 BASE) MCG/ACT IN AERS: 2.0000 | INHALATION_SPRAY | Freq: Four times a day (QID) | RESPIRATORY_TRACT | Status: DC | PRN

## 2014-06-26 MED ORDER — LISINOPRIL 5 MG PO TABS: 2.5000 mg | ORAL_TABLET | Freq: Two times a day (BID) | ORAL | Status: DC

## 2014-06-26 MED ORDER — ATENOLOL 25 MG PO TABS: 25.0000 mg | ORAL_TABLET | Freq: Every day | ORAL | Status: DC

## 2014-06-26 MED ORDER — MOMETASONE FURO-FORMOTEROL FUM 200-5 MCG/ACT IN AERO: 2.0000 | INHALATION_SPRAY | Freq: Two times a day (BID) | RESPIRATORY_TRACT | Status: DC

## 2014-06-26 MED ORDER — SIMVASTATIN 20 MG PO TABS: 20.0000 mg | ORAL_TABLET | Freq: Every evening | ORAL | Status: DC

## 2014-06-26 NOTE — Assessment & Plan Note (Signed)
Diabetes (longstanding): Patient is improving blood glucose control since last visit. No med change at this time. Instructed pt to increase frequency (2x daily) and consistency of BG monitoring.  Denies hypoglycemic events and is able to verbalize appropriate hypoglycemia management plan.  Reports adherence with medication.

## 2014-06-26 NOTE — Assessment & Plan Note (Signed)
Dyspnea: Reports breathing better and feels great since beginning [facility]. If able to quit smoking for significant amt of time, would like to repeat LFTs (>1 month). Last spirometry showed severe obstruction with some improvement after albuterol neb.

## 2014-06-26 NOTE — Patient Instructions (Addendum)
It was good to see you today! Your blood sugars seem to be doing much better. Continue doing your best to check your blood sugar every morning. Let us know how you do when you try to quit for a week. Please schedule an appointment to be seen in about a month and have your A1c checked.

## 2014-06-26 NOTE — Progress Notes (Signed)
S/O:    Patient arrives in good spirits.    Presents for diabetes f/u and smoking cessation. FBG readings from pt's meter trending downward after insulin dose increase and addition of Victoza, although readings are somewhat sporadic. He endorses frequently going 4-5 days without smoking, and expresses interest in stretching this to 1 week. He also reports d/cing alcohol use on weekends, which was previously a trigger for his smoking.  . Lab Results  Component Value Date   HGBA1C 11.2 04/21/2014     home fasting CBG readings of 110-302 (avg ~230).  2 hour post-prandial/random CBG readings of N/A (checking only FBG)  A/P: Diabetes (longstanding): Patient is improving blood glucose control since last visit. No med change at this time. Instructed pt to increase frequency (2x daily) and consistency of BG monitoring.  Denies hypoglycemic events and is able to verbalize appropriate hypoglycemia management plan.  Reports adherence with medication.   Longstanding tobacco abuse: Pt is reporting frequently going 4-5 without smoking. When he smokes he smoke ~6 cigarettes but when challenged to quit for a week seems confident. Made plan and counseled for this.  Dyspnea: Reports breathing better and feels great since beginning [facility]. If able to quit smoking for significant amt of time, would like to repeat LFTs (>1 month). Last spirometry showed severe obstruction with some improvement after albuterol neb.     Next A1C anticipated in 1 month (mid-November).  Written patient instructions provided.  Follow up in Pharmacist Clinic Visit 1 mo.   Total time in face to face counseling 30 minutes.  Patient seen with Clydie Braun, PharmD Candidate and Katherine Roan,  PharmD Resident.

## 2014-06-26 NOTE — Assessment & Plan Note (Signed)
Longstanding tobacco abuse: Pt is reporting frequently going 4-5 without smoking. When he smokes he smoke ~6 cigarettes but when challenged to quit for a week seems confident. Made plan and counseled for this.

## 2014-06-26 NOTE — Progress Notes (Signed)
Patient ID: [name] [name], male   DOB: 03/31/1961, 53 y.o.   MRN: [id number] Reviewed: Agree with Dr. Koval's documentation and management. 

## 2014-06-27 ENCOUNTER — Ambulatory Visit: Payer: No Typology Code available for payment source | Admitting: Physician Assistant

## 2014-07-08 ENCOUNTER — Encounter: Payer: Self-pay | Admitting: Family Medicine

## 2014-07-08 ENCOUNTER — Ambulatory Visit (INDEPENDENT_AMBULATORY_CARE_PROVIDER_SITE_OTHER): Payer: No Typology Code available for payment source | Admitting: Family Medicine

## 2014-07-08 VITALS — BP 129/78 | HR 67 | Temp 98.0°F | Wt 273.0 lb

## 2014-07-08 DIAGNOSIS — E119 Type 2 diabetes mellitus without complications: Secondary | ICD-10-CM

## 2014-07-08 DIAGNOSIS — Z23 Encounter for immunization: Secondary | ICD-10-CM

## 2014-07-08 DIAGNOSIS — I10 Essential (primary) hypertension: Secondary | ICD-10-CM

## 2014-07-08 LAB — POCT GLYCOSYLATED HEMOGLOBIN (HGB A1C): Hemoglobin A1C: 8.5

## 2014-07-08 MED ORDER — ATENOLOL 25 MG PO TABS: 25.0000 mg | ORAL_TABLET | Freq: Every day | ORAL | Status: DC

## 2014-07-08 NOTE — Patient Instructions (Signed)
Diabetes Mellitus and Food It is important for you to manage your blood sugar (glucose) level. Your blood glucose level can be greatly affected by what you eat. Eating healthier foods in the appropriate amounts throughout the day at about the same time each day will help you control your blood glucose level. It can also help slow or prevent worsening of your diabetes mellitus. Healthy eating may even help you improve the level of your blood pressure and reach or maintain a healthy weight.  HOW CAN FOOD AFFECT ME? Carbohydrates Carbohydrates affect your blood glucose level more than any other type of food. Your dietitian will help you determine how many carbohydrates to eat at each meal and teach you how to count carbohydrates. Counting carbohydrates is important to keep your blood glucose at a healthy level, especially if you are using insulin or taking certain medicines for diabetes mellitus. Alcohol Alcohol can cause sudden decreases in blood glucose (hypoglycemia), especially if you use insulin or take certain medicines for diabetes mellitus. Hypoglycemia can be a life-threatening condition. Symptoms of hypoglycemia (sleepiness, dizziness, and disorientation) are similar to symptoms of having too much alcohol.  If your health care provider has given you approval to drink alcohol, do so in moderation and use the following guidelines:  Women should not have more than one drink per day, and men should not have more than two drinks per day. One drink is equal to:  12 oz of beer.  5 oz of wine.  1 oz of hard liquor.  Do not drink on an empty stomach.  Keep yourself hydrated. Have water, diet soda, or unsweetened iced tea.  Regular soda, juice, and other mixers might contain a lot of carbohydrates and should be counted. WHAT FOODS ARE NOT RECOMMENDED? As you make food choices, it is important to remember that all foods are not the same. Some foods have fewer nutrients per serving than other  foods, even though they might have the same number of calories or carbohydrates. It is difficult to get your body what it needs when you eat foods with fewer nutrients. Examples of foods that you should avoid that are high in calories and carbohydrates but low in nutrients include:  Trans fats (most processed foods list trans fats on the Nutrition Facts label).  Regular soda.  Juice.  Candy.  Sweets, such as cake, pie, doughnuts, and cookies.  Fried foods. WHAT FOODS CAN I EAT? Have nutrient-rich foods, which will nourish your body and keep you healthy. The food you should eat also will depend on several factors, including:  The calories you need.  The medicines you take.  Your weight.  Your blood glucose level.  Your blood pressure level.  Your cholesterol level. You also should eat a variety of foods, including:  Protein, such as meat, poultry, fish, tofu, nuts, and seeds (lean animal proteins are best).  Fruits.  Vegetables.  Dairy products, such as milk, cheese, and yogurt (low fat is best).  Breads, grains, pasta, cereal, rice, and beans.  Fats such as olive oil, trans fat-free margarine, canola oil, avocado, and olives. DOES EVERYONE WITH DIABETES MELLITUS HAVE THE SAME MEAL PLAN? Because every person with diabetes mellitus is different, there is not one meal plan that works for everyone. It is very important that you meet with a dietitian who will help you create a meal plan that is just right for you. Document Released: 05/26/2005 Document Revised: 09/03/2013 Document Reviewed: 07/26/2013 [employer] Patient Information 2015 [employer]. This   information is not intended to replace advice given to you by your health care provider. Make sure you discuss any questions you have with your health care provider. Hypertension Hypertension, commonly called high blood pressure, is when the force of blood pumping through your arteries is too strong. Your arteries are the  blood vessels that carry blood from your heart throughout your body. A blood pressure reading consists of a higher number over a lower number, such as 110/72. The higher number (systolic) is the pressure inside your arteries when your heart pumps. The lower number (diastolic) is the pressure inside your arteries when your heart relaxes. Ideally you want your blood pressure below 120/80. Hypertension forces your heart to work harder to pump blood. Your arteries may become narrow or stiff. Having hypertension puts you at risk for heart disease, stroke, and other problems.  RISK FACTORS Some risk factors for high blood pressure are controllable. Others are not.  Risk factors you cannot control include:   Race. You may be at higher risk if you are African American.  Age. Risk increases with age.  Gender. Men are at higher risk than women before age 46 years. After age 72, women are at higher risk than men. Risk factors you can control include:  Not getting enough exercise or physical activity.  Being overweight.  Getting too much fat, sugar, calories, or salt in your diet.  Drinking too much alcohol. SIGNS AND SYMPTOMS Hypertension does not usually cause signs or symptoms. Extremely high blood pressure (hypertensive crisis) may cause headache, anxiety, shortness of breath, and nosebleed. DIAGNOSIS  To check if you have hypertension, your health care provider will measure your blood pressure while you are seated, with your arm held at the level of your heart. It should be measured at least twice using the same arm. Certain conditions can cause a difference in blood pressure between your right and left arms. A blood pressure reading that is higher than normal on one occasion does not mean that you need treatment. If one blood pressure reading is high, ask your health care provider about having it checked again. TREATMENT  Treating high blood pressure includes making lifestyle changes and possibly  taking medicine. Living a healthy lifestyle can help lower high blood pressure. You may need to change some of your habits. Lifestyle changes may include:  Following the DASH diet. This diet is high in fruits, vegetables, and whole grains. It is low in salt, red meat, and added sugars.  Getting at least 2 hours of brisk physical activity every week.  Losing weight if necessary.  Not smoking.  Limiting alcoholic beverages.  Learning ways to reduce stress. If lifestyle changes are not enough to get your blood pressure under control, your health care provider may prescribe medicine. You may need to take more than one. Work closely with your health care provider to understand the risks and benefits. HOME CARE INSTRUCTIONS  Have your blood pressure rechecked as directed by your health care provider.   Take medicines only as directed by your health care provider. Follow the directions carefully. Blood pressure medicines must be taken as prescribed. The medicine does not work as well when you skip doses. Skipping doses also puts you at risk for problems.   Do not smoke.   Monitor your blood pressure at home as directed by your health care provider. SEEK MEDICAL CARE IF:   You think you are having a reaction to medicines taken.  You have recurrent headaches or  feel dizzy.  You have swelling in your ankles.  You have trouble with your vision. SEEK IMMEDIATE MEDICAL CARE IF:  You develop a severe headache or confusion.  You have unusual weakness, numbness, or feel faint.  You have severe chest or abdominal pain.  You vomit repeatedly.  You have trouble breathing. MAKE SURE YOU:   Understand these instructions.  Will watch your condition.  Will get help right away if you are not doing well or get worse. Document Released: 08/29/2005 Document Revised: 01/13/2014 Document Reviewed: 06/21/2013 [facility] Patient Information 2015 [place], [place]. This information is not  intended to replace advice given to you by your health care provider. Make sure you discuss any questions you have with your health care provider.

## 2014-07-08 NOTE — Progress Notes (Signed)
    Subjective:    Patient ID: Desiree Fleming is a 53 y.o. male presenting with Diabetes  on 07/08/2014  HPI: Reports cutting down on smoking due to cough drops.  Also has improved his Hemoglobin A1C.  His diet has improved, increased fresh fruits and vegetables.  Review of Systems  Constitutional: Negative for fever and chills.  Respiratory: Positive for shortness of breath (little bit).   Cardiovascular: Negative for leg swelling.  Gastrointestinal: Negative for nausea, vomiting and abdominal pain.      Objective:    BP 129/78  Pulse 67  Temp(Src) 98 F (36.7 C) (Oral)  Wt 273 lb (123.832 kg) Physical Exam  Vitals reviewed. Constitutional: He appears well-developed and well-nourished. No distress.  HENT:  Head: Normocephalic and atraumatic.  Eyes: No scleral icterus.  Neck: Neck supple.  Cardiovascular: Normal rate.   Pulmonary/Chest: Effort normal.  Abdominal: Soft.  Musculoskeletal: He exhibits no edema.  Neurological: He is alert.  Skin: Skin is warm.  Psychiatric: He has a normal mood and affect.        Assessment & Plan:   Problem List Items Addressed This Visit     Medium   HTN (hypertension) (Chronic)     BP is under control    Relevant Medications      atenolol (TENORMIN) tablet   DM type 2 (diabetes mellitus, type 2) - Primary     Improving blood sugar control due to dietary issues. Written and verbal instructions given.    Relevant Orders      POCT A1C (Completed)    Other Visit Diagnoses   Encounter for immunization         Flu shot today  Return in about 3 months (around 10/08/2014).

## 2014-07-08 NOTE — Assessment & Plan Note (Signed)
BP is under control. ° °

## 2014-07-08 NOTE — Assessment & Plan Note (Signed)
Improving blood sugar control due to dietary issues. Written and verbal instructions given.

## 2014-07-11 ENCOUNTER — Ambulatory Visit (HOSPITAL_COMMUNITY): Payer: No Typology Code available for payment source | Attending: Cardiology | Admitting: Radiology

## 2014-07-11 ENCOUNTER — Telehealth: Payer: Self-pay

## 2014-07-11 VITALS — BP 115/52 | HR 74 | Ht 67.0 in | Wt 271.0 lb

## 2014-07-11 DIAGNOSIS — R0609 Other forms of dyspnea: Secondary | ICD-10-CM | POA: Insufficient documentation

## 2014-07-11 DIAGNOSIS — R61 Generalized hyperhidrosis: Secondary | ICD-10-CM | POA: Insufficient documentation

## 2014-07-11 DIAGNOSIS — I10 Essential (primary) hypertension: Secondary | ICD-10-CM | POA: Insufficient documentation

## 2014-07-11 DIAGNOSIS — Z794 Long term (current) use of insulin: Secondary | ICD-10-CM | POA: Insufficient documentation

## 2014-07-11 DIAGNOSIS — R079 Chest pain, unspecified: Secondary | ICD-10-CM | POA: Insufficient documentation

## 2014-07-11 DIAGNOSIS — I2510 Atherosclerotic heart disease of native coronary artery without angina pectoris: Secondary | ICD-10-CM | POA: Insufficient documentation

## 2014-07-11 DIAGNOSIS — E119 Type 2 diabetes mellitus without complications: Secondary | ICD-10-CM | POA: Insufficient documentation

## 2014-07-11 MED ORDER — TECHNETIUM TC 99M SESTAMIBI GENERIC - CARDIOLITE
33.0000 | Freq: Once | INTRAVENOUS | Status: AC | PRN
  Administered 2014-07-11: 33 via INTRAVENOUS

## 2014-07-11 MED ORDER — REGADENOSON 0.4 MG/5ML IV SOLN
0.4000 mg | Freq: Once | INTRAVENOUS | Status: AC
Start: 2014-07-11 — End: 2014-07-11
  Administered 2014-07-11: 0.4 mg via INTRAVENOUS

## 2014-07-11 NOTE — Telephone Encounter (Signed)
Patient came to the office to get samples of effeint gave to him at the front desk

## 2014-07-11 NOTE — Progress Notes (Signed)
[facility] [address] [place], [place] [address] [phone]    Cardiology Nuclear Med Study  [name] [name] is a 53 y.o. male     MRN : [id number]     DOB: [birth year]  Procedure Date: 07/11/2014  Nuclear Med Background Indication for Stress Test:  Evaluation for Ischemia History:  CAD, MPI 2014 (normal) in [place] Cardiac Risk Factors: Hypertension and IDDM  Symptoms:  Chest Pain, Diaphoresis and DOE   Nuclear Pre-Procedure Caffeine/Decaff Intake:  None NPO After: 0930 Steak and cheese sandwich and milk   Lungs:  clear O2 Sat: 96% on room air. IV 0.9% NS with Angio Cath:  22g  IV Site: R Hand  IV Started by:  Matilde Haymaker, RN  Chest Size (in):  54 Cup Size: n/a  Height: 5\' 7"  (1.702 m)  Weight:  271 lb (122.925 kg)  BMI:  Body mass index is 42.43 kg/(m^2). Tech Comments:  Atenolol taken at 0900    Nuclear Med Study 1 or 2 day study: 2 day  Stress Test Type:  Treadmill/Lexiscan  Reading MD: n/a  Order Authorizing Provider:  Geanie Cooley and Lenora Boys  Resting Radionuclide: Technetium 30m Sestamibi  Resting Radionuclide Dose: 33.0 mCi on 07/18/14   Stress Radionuclide:  Technetium 85m Sestamibi  Stress Radionuclide Dose: 33.0 mCi on 07/11/14           Stress Protocol Rest HR: 74 Stress HR: 106  Rest BP: 115/52 Stress BP: 148/66  Exercise Time (min): n/a METS: n/a   Predicted Max HR: 167 bpm % Max HR: 63.47 bpm Rate Pressure Product: 16112   Dose of Adenosine (mg):  n/a Dose of Lexiscan: 0.4 mg  Dose of Atropine (mg): n/a Dose of Dobutamine: n/a mcg/kg/min (at max HR)  Stress Test Technologist: Glade Lloyd, BS-ES  Nuclear Technologist:  Earl Many, CNMT     Rest Procedure:  Myocardial perfusion imaging was performed at rest 45 minutes following the intravenous administration of Technetium 65m Sestamibi. Rest ECG: NSR - Normal EKG  Stress Procedure:  The patient received IV Lexiscan 0.4 mg over 15-seconds with  concurrent low level exercise and then Technetium 78m Sestamibi was injected at 30-seconds while the patient continued walking one more minute.  Quantitative spect images were obtained after a 45-minute delay.  Attempted to walk patient on Bruce Procotol but he became SOB and fatigued. He said he could not continue.  Changes to low level Lexiscan.  During the infusion the patient complained of SOB and chest discomfort that eased in recovery.  Stress ECG: No significant change from baseline ECG  QPS Raw Data Images:  Normal; no motion artifact; normal heart/lung ratio. Stress Images:  There is a large area of moderate attenuation in the mid-basal inferolateral wall.  Rest Images:  Normal homogeneous uptake in all areas of the myocardium. Subtraction (SDS):  There is a large area of reversible ischemia in the  mid/ basal inferolateral walls Transient Ischemic Dilatation (Normal <1.22):  1.34 Lung/Heart Ratio (Normal <0.45):  0.37  Quantitative Gated Spect Images QGS EDV:  146 ml QGS ESV:  70 ml  Impression Exercise Capacity:  Lexiscan with low level exercise. BP Response:  Normal blood pressure response. Clinical Symptoms:  Mild chest pain/dyspnea. ECG Impression:  No significant ST segment change suggestive of ischemia. Comparison with Prior Nuclear Study: No images to compare  Overall Impression:  Intermediate risk stress nuclear study .  There is evidence of a large area of ischemia in  the mid-basal inferolateral walls.  .  LV Ejection Fraction: 52%.  LV Wall Motion:  NL LV Function; NL Wall Motion.   Thayer Headings, Brooke Bonito., MD, [facility] 07/18/2014, 4:21 PM [address],  [place] Pager [phone]

## 2014-07-13 HISTORY — PX: CARDIAC CATHETERIZATION: SHX172

## 2014-07-13 HISTORY — PX: CORONARY ANGIOPLASTY WITH STENT PLACEMENT: SHX49

## 2014-07-18 ENCOUNTER — Ambulatory Visit (HOSPITAL_COMMUNITY): Payer: No Typology Code available for payment source | Attending: Internal Medicine

## 2014-07-18 DIAGNOSIS — R0989 Other specified symptoms and signs involving the circulatory and respiratory systems: Secondary | ICD-10-CM

## 2014-07-18 MED ORDER — TECHNETIUM TC 99M SESTAMIBI GENERIC - CARDIOLITE
33.0000 | Freq: Once | INTRAVENOUS | Status: AC | PRN
  Administered 2014-07-18: 33 via INTRAVENOUS

## 2014-07-21 ENCOUNTER — Telehealth: Payer: Self-pay

## 2014-07-21 NOTE — Telephone Encounter (Signed)
Patient called no answer.Left message on personal voice mail received a message from [place] need to schedule appointment with him to review your 07/11/14 myoview.Advised to call me back 07/22/14 to schedule appointment.

## 2014-07-22 ENCOUNTER — Telehealth: Payer: Self-pay | Admitting: *Deleted

## 2014-07-22 DIAGNOSIS — E1159 Type 2 diabetes mellitus with other circulatory complications: Secondary | ICD-10-CM

## 2014-07-22 NOTE — Telephone Encounter (Signed)
Received fax from [facility] stating they need a new Rx for the correct dosage for Lantus.  Fax [phone] or phone [phone]. Derl Barrow, RN

## 2014-07-23 MED ORDER — INSULIN GLARGINE 100 UNIT/ML ~~LOC~~ SOLN: 35.0000 [IU] | Freq: Two times a day (BID) | SUBCUTANEOUS | Status: DC

## 2014-07-23 NOTE — Telephone Encounter (Signed)
MAP is closed today.  Left voice message on pharmacy line for the Lantus Rx Inject 0.35 mLs (35 Units total) into the skin 2 (two) times daily by Dr. Kennon Rounds.  Derl Barrow, RN

## 2014-07-28 ENCOUNTER — Encounter: Payer: Self-pay | Admitting: Cardiology

## 2014-07-28 ENCOUNTER — Ambulatory Visit (INDEPENDENT_AMBULATORY_CARE_PROVIDER_SITE_OTHER): Payer: No Typology Code available for payment source | Admitting: Cardiology

## 2014-07-28 VITALS — BP 122/68 | HR 84 | Ht 67.5 in | Wt 269.3 lb

## 2014-07-28 DIAGNOSIS — R931 Abnormal findings on diagnostic imaging of heart and coronary circulation: Secondary | ICD-10-CM

## 2014-07-28 DIAGNOSIS — Z79899 Other long term (current) drug therapy: Secondary | ICD-10-CM

## 2014-07-28 DIAGNOSIS — I2510 Atherosclerotic heart disease of native coronary artery without angina pectoris: Secondary | ICD-10-CM

## 2014-07-28 DIAGNOSIS — R9439 Abnormal result of other cardiovascular function study: Secondary | ICD-10-CM

## 2014-07-28 DIAGNOSIS — Z7901 Long term (current) use of anticoagulants: Secondary | ICD-10-CM

## 2014-07-28 DIAGNOSIS — R5383 Other fatigue: Secondary | ICD-10-CM

## 2014-07-28 LAB — CBC
HCT: 42.3 % (ref 39.0–52.0)
HEMOGLOBIN: 14.7 g/dL (ref 13.0–17.0)
MCH: 30.6 pg (ref 26.0–34.0)
MCHC: 34.8 g/dL (ref 30.0–36.0)
MCV: 88.1 fL (ref 78.0–100.0)
MPV: 8.7 fL — AB (ref 9.4–12.4)
Platelets: 281 10*3/uL (ref 150–400)
RBC: 4.8 MIL/uL (ref 4.22–5.81)
RDW: 13.3 % (ref 11.5–15.5)
WBC: 9.3 10*3/uL (ref 4.0–10.5)

## 2014-07-28 MED ORDER — NITROGLYCERIN 0.4 MG SL SUBL: 0.4000 mg | SUBLINGUAL_TABLET | SUBLINGUAL | Status: DC | PRN

## 2014-07-28 NOTE — Progress Notes (Signed)
07/28/2014 [name] [name]   1961/03/31  [id number]  Primary Physician [name], MD Primary Cardiologist: [name]  HPI:  The patient is a 53 y/o male, followed by [name]. His past medical history is significant for CAD, poorly controlled diabetes with most recent hemoglobin A1c of 11.2, obesity, tobacco use (1/2 ppd), hyperlipidemia and hypertension. He is status post stenting of the right coronary artery in May of 2013. He was recently admitted with a NSTEMI inFebruary of this year and was found to have severe disease in the LCx. This was treated with a BMS. He was placed on dual antiplatelet therapy with aspirin plus Effient.   I evaluated him in clinic on 06/09/2014 for routine follow-up. At that time, he reported that he continued to have intermittent episodes of substernal chest pressure that would occur both at rest and with physical activity. He reported that his symptoms were nitrate responsive and he admitted to using sublingual nitroglycerin frequently, at least 1-2 times on a weekly basis. His EKG during that visit demonstrated normal sinus rhythm with nonspecific T-wave abnormalities, however compared to prior EKGs there were no significant changes. Given his history of CAD as well as multiple risk factors including continued tobacco abuse, hyperlipidemia, hypertension and poorly controlled diabetes, I recommended that he undergo repeat ischemic evaluation with a nuclear stress test. This was a myocardial perfusion study that was interpreted as an "intermediate risk" study. There is evidence of a large area of ischemia in the mid-basal inferior lateral walls. LV ejection fraction was estimated at 52%. The results were reported to his primary cardiologist, [name], who recommended the patient presented back to clinic to discuss repeat left heart catheterization.  The patient presents to clinic today for follow-up. He is currently chest pain-free but states that he continues to  have intermittent episodes of substernal chest discomfort. His pain continues to be relieved with sublingual nitroglycerin, however he states that he only has 3 tablets left and is in need of refills. He reports full medication compliance with aspirin and Effient, as well as all of his other prescribed medications. Unfortunately, he continues to smoke cigarettes. He has been compliant with his insulin for his diabetes which is followed by his PCP.  EKG today demonstrates normal sinus rhythm.  His blood pressure is stable at 122/68.   Current Outpatient Prescriptions  Medication Sig Dispense Refill  . ACCU-CHEK FASTCLIX LANCETS MISC 1 Units by Percutaneous route 4 (four) times daily. 100 each 12  . albuterol (PROVENTIL HFA;VENTOLIN HFA) 108 (90 BASE) MCG/ACT inhaler Inhale 2 puffs into the lungs every 6 (six) hours as needed for wheezing or shortness of breath. 1 Inhaler 11  . aspirin 81 MG chewable tablet Chew 1 tablet (81 mg total) by mouth daily. 180 tablet 2  . atenolol (TENORMIN) 25 MG tablet Take 1 tablet (25 mg total) by mouth daily. 30 tablet 11  . insulin aspart (NOVOLOG) 100 UNIT/ML injection Inject 20 Units into the skin 3 (three) times daily with meals.    . insulin glargine (LANTUS) 100 UNIT/ML injection Inject 0.35 mLs (35 Units total) into the skin 2 (two) times daily. 10 mL 3  . Insulin Syringe-Needle U-100 (FREESTYLE PRECISION INS SYR) 31G X 5/16" 1 ML MISC 1 Units by Does not apply route as directed. Dx: Diabetes 250.02.  Dispense:  QS for 5 shots daily - 1 month supply 100 each prn  . Liraglutide 18 MG/3ML SOPN Inject 1.8 mg into the skin daily. Dispense QS  for 1 month supply 3 pen 11  . lisinopril (PRINIVIL,ZESTRIL) 5 MG tablet Take 0.5 tablets (2.5 mg total) by mouth 2 (two) times daily. 60 tablet 11  . metFORMIN (GLUCOPHAGE) 1000 MG tablet Take 1 tablet (1,000 mg total) by mouth 2 (two) times daily with a meal. 60 tablet 11  . mometasone-formoterol (DULERA) 200-5 MCG/ACT AERO  Inhale 2 puffs into the lungs 2 (two) times daily. Dispense QS for 1 month supply. 1 Inhaler 0  . nitroGLYCERIN (NITROSTAT) 0.4 MG SL tablet Place 1 tablet (0.4 mg total) under the tongue every 5 (five) minutes x 3 doses as needed for chest pain. 25 tablet 1  . Omega-3 Fatty Acids (FISH OIL PO) Take 1 capsule by mouth 2 (two) times daily.     . prasugrel (EFFIENT) 10 MG TABS tablet Take 1 tablet (10 mg total) by mouth daily. 30 tablet 0  . simvastatin (ZOCOR) 20 MG tablet Take 1 tablet (20 mg total) by mouth every evening. 30 tablet 11  . traMADol (ULTRAM) 50 MG tablet Take 50 mg by mouth every 6 (six) hours as needed for severe pain.     No current facility-administered medications for this visit.    Allergies  Allergen Reactions  . Shellfish Allergy Nausea And Vomiting    History   Social History  . Marital Status: Single    Spouse Name: N/A    Number of Children: 0  . Years of Education: 15   Occupational History  . Unemployed     Used to work for [employer]  . Student, computer tech [employer]    Social History Main Topics  . Smoking status: Current Some Day Smoker -- 0.50 packs/day for 35 years    Types: Cigarettes  . Smokeless tobacco: Never Used  . Alcohol Use: 14.4 oz/week    24 Cans of beer per week     Comment: 11/07/2013 "12 beers twice a week"  . Drug Use: No  . Sexual Activity: Not Currently   Other Topics Concern  . Not on file   Social History Narrative   Single.  Lives with a roommate.  Ambulates independently.     Review of Systems: General: negative for chills, fever, night sweats or weight changes.  Cardiovascular: negative for chest pain, dyspnea on exertion, edema, orthopnea, palpitations, paroxysmal nocturnal dyspnea or shortness of breath Dermatological: negative for rash Respiratory: negative for cough or wheezing Urologic: negative for hematuria Abdominal: negative for nausea, vomiting, diarrhea, bright red blood per rectum, melena, or  hematemesis Neurologic: negative for visual changes, syncope, or dizziness All other systems reviewed and are otherwise negative except as noted above.    Blood pressure 122/68, pulse 84, height 5' 7.5" (1.715 m), weight 269 lb 4.8 oz (122.154 kg).  General appearance: alert, cooperative and no distress Neck: no carotid bruit and no JVD Lungs: clear to auscultation bilaterally Heart: regular rate and rhythm, S1, S2 normal, no murmur, click, rub or gallop Extremities: no LEE Pulses: 2+ and symmetric Skin: warm and dry Neurologic: Grossly normal  EKG normal sinus rhythm. Heart rate 84 bpm.  ASSESSMENT AND PLAN:   1. Abnormal nuclear stress test: will arrange for left heart catheterization at [facility]  2. CAD/unstable angina: continue dual antiplatelet therapy with aspirin plus Effient, lisinopril, simvastatin, atenolol and sublingual nitroglycerin. Will order refills for SL nitroglycerin. Patient instructed to call 911 if symptoms fail to improve with nitroglycerin x 3. Plan outpatient left heart catheterization to redefine coronary anatomy.  3. Hypertension: Well-controlled.  4. Hyperlipidemia: Continue simvastatin.  5. Diabetes: Continue management per PCP.  6. Tobacco abuse: Smoking cessation strongly advised.  PLAN Will arrange outpatient LHC with [name], first available. For now, continue medical therapy.  Yader Criger, BRITTAINYPA-C 07/28/2014 4:29 PM

## 2014-07-28 NOTE — Patient Instructions (Signed)
Your physician has requested that you have a cardiac catheterization by [name]. Cardiac catheterization is used to diagnose and/or treat various heart conditions. Doctors may recommend this procedure for a number of different reasons. The most common reason is to evaluate chest pain. Chest pain can be a symptom of coronary artery disease (CAD), and cardiac catheterization can show whether plaque is narrowing or blocking your heart's arteries. This procedure is also used to evaluate the valves, as well as measure the blood flow and oxygen levels in different parts of your heart. For further information please visit [email or web address]. Please follow instruction sheet, as given.  Your physician recommends that you return for lab work in: 5-7 days prior to the cardiac cath.

## 2014-07-29 ENCOUNTER — Encounter: Payer: Self-pay | Admitting: Cardiology

## 2014-07-29 LAB — COMPREHENSIVE METABOLIC PANEL
ALT: 19 U/L (ref 0–53)
AST: 14 U/L (ref 0–37)
Albumin: 4.1 g/dL (ref 3.5–5.2)
Alkaline Phosphatase: 105 U/L (ref 39–117)
BUN: 18 mg/dL (ref 6–23)
CALCIUM: 9.5 mg/dL (ref 8.4–10.5)
CHLORIDE: 100 meq/L (ref 96–112)
CO2: 25 meq/L (ref 19–32)
Creat: 0.8 mg/dL (ref 0.50–1.35)
Glucose, Bld: 198 mg/dL — ABNORMAL HIGH (ref 70–99)
Potassium: 4.5 mEq/L (ref 3.5–5.3)
Sodium: 137 mEq/L (ref 135–145)
Total Bilirubin: 0.4 mg/dL (ref 0.2–1.2)
Total Protein: 7.2 g/dL (ref 6.0–8.3)

## 2014-07-29 LAB — PROTIME-INR
INR: 0.96 (ref <1.50)
Prothrombin Time: 12.8 seconds (ref 11.6–15.2)

## 2014-07-29 LAB — APTT: aPTT: 28 seconds (ref 24–37)

## 2014-07-30 ENCOUNTER — Other Ambulatory Visit: Payer: Self-pay | Admitting: *Deleted

## 2014-07-30 DIAGNOSIS — R9439 Abnormal result of other cardiovascular function study: Secondary | ICD-10-CM

## 2014-07-31 ENCOUNTER — Ambulatory Visit (INDEPENDENT_AMBULATORY_CARE_PROVIDER_SITE_OTHER): Payer: No Typology Code available for payment source | Admitting: Pharmacist

## 2014-07-31 ENCOUNTER — Encounter: Payer: Self-pay | Admitting: Pharmacist

## 2014-07-31 VITALS — BP 108/69 | HR 74 | Ht 67.5 in | Wt 272.7 lb

## 2014-07-31 DIAGNOSIS — Z720 Tobacco use: Secondary | ICD-10-CM

## 2014-07-31 DIAGNOSIS — E119 Type 2 diabetes mellitus without complications: Secondary | ICD-10-CM

## 2014-07-31 DIAGNOSIS — Z9861 Coronary angioplasty status: Secondary | ICD-10-CM

## 2014-07-31 DIAGNOSIS — I2510 Atherosclerotic heart disease of native coronary artery without angina pectoris: Secondary | ICD-10-CM

## 2014-07-31 MED ORDER — NITROGLYCERIN 0.4 MG SL SUBL: 0.4000 mg | SUBLINGUAL_TABLET | SUBLINGUAL | Status: DC | PRN

## 2014-07-31 MED ORDER — ACETAMINOPHEN 500 MG PO TABS
1000.0000 mg | ORAL_TABLET | Freq: Four times a day (QID) | ORAL | Status: DC | PRN
Start: 2014-07-31 — End: 2014-11-22

## 2014-07-31 NOTE — Patient Instructions (Signed)
Thanks for coming in!  Increase your mealtime insulin Novolog to 25 units with each meal.  This will help get you to some lower blood sugar levels.   Take two (2) Tylenol (acetaminophen) 500 mg tablets as needed for your wrist pain, follow up with Dr. Kennon Rounds at your next visit about your wrist pain.  Continue to try and get to 7 days without smoking. Return to the pharmacy clinic in December.

## 2014-07-31 NOTE — Progress Notes (Signed)
S:    Patient arrives in good spirits and not in distress.    Presents for diabetes follow up.  Patient has a history of long standing diabetes and is treated with metformin 1000mg  BID, liraglutide 1.8 mcg injection, insulin aspart 20 units TID, and insulin lantus 35 units BID.  Patient reports adherence to his medication regimen.  Patient takes insulin aspart most days BID as he usually only eats two meals.  Patient did not bring his meter with him but endorses fasting blood glucose readings in the 140-150s with occasional readings up to 200.    Patient also endorses wrist pain that has worsened in the past few months with no apparent injury or aggravating factor.   Patient has expressed a desire to quit smoking.  Has successfully quit for 4 or 5 day periods at a time before relapsing. He admits that he has NOT yet reached is goal of being tobacco free for 7 straight days.   Patient denies supply or access to nitroglycerin and requests new prescription.     O:  Lab Results  Component Value Date   HGBA1C 8.5 07/08/2014    A/P: Diabetes has history of longstanding diabetes treated with medications.   Denies hypoglycemic events and is able to verbalize appropriate hypoglycemia management plan.  Reports adherence with medication as reported above. Control is suboptimal due to fasting blood glucose readings in the 140-150 range.  Continued basal insulin Lantus (insulin glargine) dose of 35 units BID. Increased dose of rapid insulin Novolog (insulin aspart) to 25 units TID with meals.  Continue liraglutide at current dose.  Patient counseled to only take insulin aspart when eating meals.  Next A1C anticipated February 2016.  Written patient instructions provided.  Follow up in Pharmacist Clinic Visit in December 2015.   Total time in face to face counseling 45 minutes.  Patient seen with Angela Burke, PharmD Candidate and Hassie Bruce,  PharmD Resident.   Wrist pain evaluated by Dr. Erin Hearing.   Additional  evaluation and management with Dr. Kennon Rounds if OTC analgesic and ice prn pain fails to resolve wrist tenderness and pain over the upcoming weeks. Patient counseled to take Tylenol (acetaminophen) 500 mg two (2) tablets up to 4 times a day as needed for wrist pain.  Patient should follow up with Dr. Kennon Rounds at next follow up with regards to wrist pain.   Longstanding tobacco abuse with recent success of quitting for multiple days consecutively. Patient counseled to continue smoking cessation efforts.  Have set a goal to quit for 7 full days and continue to work from there.   Patient with known cardiovascular disease has been without nitroglycerin due to issues obtaining medication.  Reviewed administration frequency and need to contact 911 if pain unrelieved with first nitroglycerin dose.   Order was faxed to [facility] of health for patient to pick up SL nitroglycerin for chest pain.

## 2014-07-31 NOTE — Assessment & Plan Note (Signed)
Diabetes has history of longstanding diabetes treated with medications.   Denies hypoglycemic events and is able to verbalize appropriate hypoglycemia management plan.  Reports adherence with medication as reported above. Control is suboptimal due to fasting blood glucose readings in the 140-150 range.  Continued basal insulin Lantus (insulin glargine) dose of 35 units BID. Increased dose of rapid insulin Novolog (insulin aspart) to 25 units TID with meals.  Continue liraglutide at current dose.  Patient counseled to only take insulin aspart when eating meals.  Next A1C anticipated February 2016.  Written patient instructions provided.  Follow up in Pharmacist Clinic Visit in December 2015.   Total time in face to face counseling 45 minutes.  Patient seen with Angela Burke, PharmD Candidate and Hassie Bruce,  PharmD Resident.

## 2014-07-31 NOTE — Assessment & Plan Note (Signed)
Patient with known cardiovascular disease has been without nitroglycerin due to issues obtaining medication.  Reviewed administration frequency and need to contact 911 if pain unrelieved with first nitroglycerin dose.   Order was faxed to [facility] Department of health for patient to pick up SL nitroglycerin for chest pain.

## 2014-07-31 NOTE — Assessment & Plan Note (Signed)
Longstanding tobacco abuse with recent success of quitting for multiple days consecutively. Patient counseled to continue smoking cessation efforts.  Have set a goal to quit for 7 full days and continue to work from there.

## 2014-07-31 NOTE — Progress Notes (Signed)
Patient ID: [name] [name], male   DOB: 02/25/1961, 53 y.o.   MRN: [id number] Reviewed: Agree with Dr. Koval's documentation and management. 

## 2014-08-01 ENCOUNTER — Ambulatory Visit (HOSPITAL_COMMUNITY)
Admission: RE | Admit: 2014-08-01 | Discharge: 2014-08-02 | Disposition: A | Discharge status: Home / Self Care | Payer: Medicaid Other | Source: Ambulatory Visit | Attending: Cardiology | Admitting: Cardiology

## 2014-08-01 ENCOUNTER — Encounter (HOSPITAL_COMMUNITY): Payer: Self-pay | Admitting: Cardiology

## 2014-08-01 ENCOUNTER — Encounter (HOSPITAL_COMMUNITY)
Admission: RE | Disposition: A | Discharge status: Home / Self Care | Payer: Self-pay | Source: Ambulatory Visit | Attending: Cardiology

## 2014-08-01 ENCOUNTER — Other Ambulatory Visit: Payer: Self-pay

## 2014-08-01 DIAGNOSIS — I209 Angina pectoris, unspecified: Secondary | ICD-10-CM | POA: Diagnosis present

## 2014-08-01 DIAGNOSIS — Y9289 Other specified places as the place of occurrence of the external cause: Secondary | ICD-10-CM | POA: Insufficient documentation

## 2014-08-01 DIAGNOSIS — E1165 Type 2 diabetes mellitus with hyperglycemia: Secondary | ICD-10-CM | POA: Insufficient documentation

## 2014-08-01 DIAGNOSIS — E785 Hyperlipidemia, unspecified: Secondary | ICD-10-CM | POA: Diagnosis not present

## 2014-08-01 DIAGNOSIS — Y838 Other surgical procedures as the cause of abnormal reaction of the patient, or of later complication, without mention of misadventure at the time of the procedure: Secondary | ICD-10-CM | POA: Diagnosis not present

## 2014-08-01 DIAGNOSIS — Z7901 Long term (current) use of anticoagulants: Secondary | ICD-10-CM | POA: Insufficient documentation

## 2014-08-01 DIAGNOSIS — Z7982 Long term (current) use of aspirin: Secondary | ICD-10-CM | POA: Diagnosis not present

## 2014-08-01 DIAGNOSIS — Z955 Presence of coronary angioplasty implant and graft: Secondary | ICD-10-CM | POA: Insufficient documentation

## 2014-08-01 DIAGNOSIS — I25110 Atherosclerotic heart disease of native coronary artery with unstable angina pectoris: Secondary | ICD-10-CM | POA: Diagnosis not present

## 2014-08-01 DIAGNOSIS — Z720 Tobacco use: Secondary | ICD-10-CM

## 2014-08-01 DIAGNOSIS — I25118 Atherosclerotic heart disease of native coronary artery with other forms of angina pectoris: Secondary | ICD-10-CM

## 2014-08-01 DIAGNOSIS — R0602 Shortness of breath: Secondary | ICD-10-CM | POA: Diagnosis present

## 2014-08-01 DIAGNOSIS — E119 Type 2 diabetes mellitus without complications: Secondary | ICD-10-CM

## 2014-08-01 DIAGNOSIS — T82897A Other specified complication of cardiac prosthetic devices, implants and grafts, initial encounter: Secondary | ICD-10-CM | POA: Insufficient documentation

## 2014-08-01 DIAGNOSIS — I10 Essential (primary) hypertension: Secondary | ICD-10-CM | POA: Diagnosis present

## 2014-08-01 DIAGNOSIS — I252 Old myocardial infarction: Secondary | ICD-10-CM | POA: Insufficient documentation

## 2014-08-01 DIAGNOSIS — R9439 Abnormal result of other cardiovascular function study: Secondary | ICD-10-CM | POA: Diagnosis present

## 2014-08-01 DIAGNOSIS — F17210 Nicotine dependence, cigarettes, uncomplicated: Secondary | ICD-10-CM | POA: Insufficient documentation

## 2014-08-01 DIAGNOSIS — Z794 Long term (current) use of insulin: Secondary | ICD-10-CM | POA: Diagnosis not present

## 2014-08-01 DIAGNOSIS — I2510 Atherosclerotic heart disease of native coronary artery without angina pectoris: Secondary | ICD-10-CM

## 2014-08-01 DIAGNOSIS — I214 Non-ST elevation (NSTEMI) myocardial infarction: Secondary | ICD-10-CM | POA: Diagnosis present

## 2014-08-01 DIAGNOSIS — F17200 Nicotine dependence, unspecified, uncomplicated: Secondary | ICD-10-CM | POA: Diagnosis present

## 2014-08-01 DIAGNOSIS — Z9861 Coronary angioplasty status: Secondary | ICD-10-CM

## 2014-08-01 DIAGNOSIS — Z87891 Personal history of nicotine dependence: Secondary | ICD-10-CM | POA: Diagnosis present

## 2014-08-01 DIAGNOSIS — E118 Type 2 diabetes mellitus with unspecified complications: Secondary | ICD-10-CM

## 2014-08-01 HISTORY — PX: LEFT HEART CATHETERIZATION WITH CORONARY ANGIOGRAM: SHX5451

## 2014-08-01 HISTORY — DX: Chronic obstructive pulmonary disease, unspecified: J44.9

## 2014-08-01 HISTORY — DX: Atherosclerotic heart disease of native coronary artery without angina pectoris: I25.10

## 2014-08-01 HISTORY — DX: Non-ST elevation (NSTEMI) myocardial infarction: I21.4

## 2014-08-01 HISTORY — DX: Atherosclerotic heart disease of native coronary artery without angina pectoris: Z98.61

## 2014-08-01 LAB — GLUCOSE, CAPILLARY
GLUCOSE-CAPILLARY: 123 mg/dL — AB (ref 70–99)
GLUCOSE-CAPILLARY: 200 mg/dL — AB (ref 70–99)
GLUCOSE-CAPILLARY: 237 mg/dL — AB (ref 70–99)
Glucose-Capillary: 157 mg/dL — ABNORMAL HIGH (ref 70–99)

## 2014-08-01 LAB — POCT ACTIVATED CLOTTING TIME
ACTIVATED CLOTTING TIME: 292 s
Activated Clotting Time: 264 seconds

## 2014-08-01 SURGERY — LEFT HEART CATHETERIZATION WITH CORONARY ANGIOGRAM
Anesthesia: LOCAL

## 2014-08-01 MED ORDER — HEPARIN SODIUM (PORCINE) 1000 UNIT/ML IJ SOLN
INTRAMUSCULAR | Status: AC
  Filled 2014-08-01: qty 1

## 2014-08-01 MED ORDER — ONDANSETRON HCL 4 MG/2ML IJ SOLN: 4.0000 mg | Freq: Four times a day (QID) | INTRAMUSCULAR | Status: DC | PRN

## 2014-08-01 MED ORDER — FENTANYL CITRATE 0.05 MG/ML IJ SOLN
INTRAMUSCULAR | Status: AC
  Filled 2014-08-01: qty 2

## 2014-08-01 MED ORDER — PRASUGREL HCL 10 MG PO TABS
10.0000 mg | ORAL_TABLET | Freq: Every day | ORAL | Status: DC
  Administered 2014-08-02: 10 mg via ORAL
  Filled 2014-08-01: qty 1

## 2014-08-01 MED ORDER — ASPIRIN 81 MG PO CHEW
CHEWABLE_TABLET | ORAL | Status: AC
  Administered 2014-08-02: 81 mg via ORAL
  Filled 2014-08-01: qty 1

## 2014-08-01 MED ORDER — MOMETASONE FURO-FORMOTEROL FUM 200-5 MCG/ACT IN AERO
2.0000 | INHALATION_SPRAY | Freq: Two times a day (BID) | RESPIRATORY_TRACT | Status: DC
  Administered 2014-08-01: 2 via RESPIRATORY_TRACT
  Filled 2014-08-01: qty 8.8

## 2014-08-01 MED ORDER — HEPARIN (PORCINE) IN NACL 2-0.9 UNIT/ML-% IJ SOLN
INTRAMUSCULAR | Status: AC
  Filled 2014-08-01: qty 1000

## 2014-08-01 MED ORDER — SODIUM CHLORIDE 0.9 % IJ SOLN: 3.0000 mL | INTRAMUSCULAR | Status: DC | PRN

## 2014-08-01 MED ORDER — PRASUGREL HCL 10 MG PO TABS
ORAL_TABLET | ORAL | Status: AC
  Filled 2014-08-01: qty 1

## 2014-08-01 MED ORDER — ACETAMINOPHEN 500 MG PO TABS
1000.0000 mg | ORAL_TABLET | Freq: Four times a day (QID) | ORAL | Status: DC | PRN
  Administered 2014-08-01: 22:00:00 1000 mg via ORAL
  Filled 2014-08-01: qty 2

## 2014-08-01 MED ORDER — ZOLPIDEM TARTRATE 5 MG PO TABS
5.0000 mg | ORAL_TABLET | Freq: Every evening | ORAL | Status: DC | PRN
  Administered 2014-08-01: 5 mg via ORAL
  Filled 2014-08-01: qty 1

## 2014-08-01 MED ORDER — NITROGLYCERIN 1 MG/10 ML FOR IR/CATH LAB
INTRA_ARTERIAL | Status: AC
  Filled 2014-08-01: qty 10

## 2014-08-01 MED ORDER — SODIUM CHLORIDE 0.9 % IV SOLN
INTRAVENOUS | Status: DC
  Administered 2014-08-01: 09:00:00 via INTRAVENOUS

## 2014-08-01 MED ORDER — PRASUGREL HCL 10 MG PO TABS
ORAL_TABLET | ORAL | Status: AC
  Administered 2014-08-02: 09:00:00 10 mg via ORAL
  Filled 2014-08-01: qty 1

## 2014-08-01 MED ORDER — SIMVASTATIN 20 MG PO TABS
20.0000 mg | ORAL_TABLET | Freq: Every evening | ORAL | Status: DC
  Filled 2014-08-01 (×2): qty 1

## 2014-08-01 MED ORDER — TRAMADOL HCL 50 MG PO TABS: 50.0000 mg | ORAL_TABLET | Freq: Four times a day (QID) | ORAL | Status: DC | PRN

## 2014-08-01 MED ORDER — ASPIRIN 81 MG PO CHEW
81.0000 mg | CHEWABLE_TABLET | Freq: Every day | ORAL | Status: DC
  Administered 2014-08-02: 81 mg via ORAL
  Filled 2014-08-01: qty 1

## 2014-08-01 MED ORDER — ATENOLOL 25 MG PO TABS
25.0000 mg | ORAL_TABLET | Freq: Every day | ORAL | Status: DC
  Administered 2014-08-02: 25 mg via ORAL
  Filled 2014-08-01 (×2): qty 1

## 2014-08-01 MED ORDER — MIDAZOLAM HCL 2 MG/2ML IJ SOLN
INTRAMUSCULAR | Status: AC
  Filled 2014-08-01: qty 2

## 2014-08-01 MED ORDER — MORPHINE SULFATE 2 MG/ML IJ SOLN: 2.0000 mg | INTRAMUSCULAR | Status: DC | PRN

## 2014-08-01 MED ORDER — NITROGLYCERIN 0.4 MG SL SUBL: 0.4000 mg | SUBLINGUAL_TABLET | SUBLINGUAL | Status: DC | PRN

## 2014-08-01 MED ORDER — LIDOCAINE HCL (PF) 1 % IJ SOLN
INTRAMUSCULAR | Status: AC
  Filled 2014-08-01: qty 30

## 2014-08-01 MED ORDER — ALBUTEROL SULFATE (2.5 MG/3ML) 0.083% IN NEBU: 3.0000 mL | INHALATION_SOLUTION | Freq: Four times a day (QID) | RESPIRATORY_TRACT | Status: DC | PRN

## 2014-08-01 MED ORDER — INSULIN ASPART 100 UNIT/ML ~~LOC~~ SOLN
0.0000 [IU] | Freq: Every day | SUBCUTANEOUS | Status: DC
  Administered 2014-08-01: 2 [IU] via SUBCUTANEOUS

## 2014-08-01 MED ORDER — INSULIN GLARGINE 100 UNIT/ML ~~LOC~~ SOLN
35.0000 [IU] | Freq: Two times a day (BID) | SUBCUTANEOUS | Status: DC
  Administered 2014-08-01 – 2014-08-02 (×2): 35 [IU] via SUBCUTANEOUS
  Filled 2014-08-01 (×3): qty 0.35

## 2014-08-01 MED ORDER — VERAPAMIL HCL 2.5 MG/ML IV SOLN
INTRAVENOUS | Status: AC
  Filled 2014-08-01: qty 2

## 2014-08-01 MED ORDER — INSULIN ASPART 100 UNIT/ML ~~LOC~~ SOLN
0.0000 [IU] | Freq: Three times a day (TID) | SUBCUTANEOUS | Status: DC
  Administered 2014-08-01 – 2014-08-02 (×2): 4 [IU] via SUBCUTANEOUS

## 2014-08-01 MED ORDER — LISINOPRIL 2.5 MG PO TABS
2.5000 mg | ORAL_TABLET | Freq: Two times a day (BID) | ORAL | Status: DC
  Administered 2014-08-01 – 2014-08-02 (×2): 2.5 mg via ORAL
  Filled 2014-08-01 (×4): qty 1

## 2014-08-01 MED ORDER — SODIUM CHLORIDE 0.9 % IV SOLN: INTRAVENOUS | Status: AC

## 2014-08-01 NOTE — Progress Notes (Signed)
TR BAND REMOVAL  LOCATION:    right radial  DEFLATED PER PROTOCOL:    Yes.    TIME BAND OFF / DRESSING APPLIED:    1800   SITE UPON ARRIVAL:    Level 0  SITE AFTER BAND REMOVAL:    Level 0  REVERSE ALLEN'S TEST:     positive  CIRCULATION SENSATION AND MOVEMENT:    Within Normal Limits   Yes.    COMMENTS:   Tolerated procedure well

## 2014-08-01 NOTE — Care Management Note (Addendum)
    Page 1 of 1   08/02/2014     9:44:02 AM CARE MANAGEMENT NOTE 08/02/2014  Patient:  [name] [name],[name] [name]   Account Number:  [id number]  Date Initiated:  08/01/2014  Documentation initiated by:  Mariann Laster  Subjective/Objective Assessment:   CAD     Action/Plan:   CM to follow for disposition needs   Anticipated DC Date:  08/02/2014   Anticipated DC Plan:  [place]  CM consult  Medication Assistance  GCC[facility] / P4HM (established/new)  [facility]      [facility] Choice  NA   Choice offered to / List presented to:             Status of service:  Completed, signed off Medicare Important Message given?   (If response is "NO", the following Medicare IM given date fields will be blank) Date Medicare IM given:   Medicare IM given by:   Date Additional Medicare IM given:   Additional Medicare IM given by:    Discharge Disposition:  HOME/SELF CARE  Per UR Regulation:    If discussed at Long Length of Stay Meetings, dates discussed:    Comments:  08/02/14 09:00 CM met with pt who is a very poor historian and cannot give a definitive answer as to whether or not he's ever had a prescription of Effient filled or if he gets samples from his cardiologist.  CM gave pt free Effient card and [place] letter bc he could not produce an orange card at time of discharge.  CM also gave pt a [place] pamphlet for use if he does not secure insurance in the near future, the clinic will asst.  No other CM needs communicated.  Mariane Masters, BSN, CM [phone]. Crystal Hutchinson RN, BSN, MSHL, CCM  Nurse - Case Manager,  (Unit [phone]  08/01/2014 prasugrel (EFFIENT) tablet 10 mg qd (Patient has [employer]) - patient active with Effient prior to this admission Dispo Plan:  Home / Self care.

## 2014-08-01 NOTE — CV Procedure (Signed)
CARDIAC CATHETERIZATION AND PERCUTANEOUS CORONARY INTERVENTION REPORT  NAME:  [name] [name]   MRN: [id number] DOB:  03-03-1961   ADMIT DATE: 08/01/2014 Procedure Date: 08/01/2014  INTERVENTIONAL CARDIOLOGIST: Leonie Man, M.D., MS PRIMARY CARE PROVIDER: [name], MD PRIMARY CARDIOLOGIST: Peter Martinique, M.D.  PATIENT:  [name] [name] is a 53 y.o. male with a history of unstable angina in 2013 where he underwent bare-metal stent placement to the proximal RCA, he then had another episode of nonsustained the in February 2015 and underwent 2 site PCI to the circumflex and OM 2 with bare-metal stents.  He has been having progressively worsening exertional dyspnea and anginal chest pain consistent with likely class II to class III angina. He had a Myoview stress test that was read as intermediate risk with inferolateral ischemia. He now presents for invasive evaluation after being seen by Ellen Henri, PA-C  PRE-OPERATIVE DIAGNOSIS:    Class 2-3 angina  Known CAD status post PCI with BMS stents  Intermediate Risk Nuclear Stress Test  PROCEDURES PERFORMED:    Left Heart Catheterization with Native Coronary Angiography  via Right Radial Artery   Left Ventriculography  Percutaneous Coronary intervention of severe in-stent restenosis in OM 2 crossing back into the native circumflex over OM1: Xience Alpine DES 3.5 mm x 28 mm and 3.6 mm proximal to 3.5 mm distal.  PROCEDURE: The patient was brought to the 2nd [facility] in the fasting state and prepped and draped in the usual sterile fashion for Right Radial artery access. A modified Allen's test was performed on the right wrist demonstrating excellent collateral flow for radial access.   Sterile technique was used including antiseptics, cap, gloves, gown, hand hygiene, mask and sheet. Skin prep: Chlorhexidine.   Consent: Risks of procedure as well as the alternatives and risks of each were explained to the  (patient/caregiver). Consent for procedure obtained.   Time Out: Verified patient identification, verified procedure, site/side was marked, verified correct patient position, special equipment/implants available, medications/allergies/relevent history reviewed, required imaging and test results available. Performed.  Access:   Right Radial Artery: 6 Fr Sheath -  seldinger Technique (Angiocath Micropuncture Kit)  Radial Cocktail - 10 mL; IV Heparin 6000 Units   Left Heart Catheterization: 5 Fr Catheters advanced or exchanged over a long exchange safety J-wire; TIG 4.0 catheter advanced first.  Left & Right Coronary Artery Cineangiography: TIG 4.0 Catheter   LV Hemodynamics (LV Gram): Angled pigtail  FINDINGS:  Hemodynamics:   Central Aortic Pressure / Mean: 117/65/55 mmHg  Left Ventricular Pressure / LVEDP: 116/11/18 mmHg  Left Ventriculography:  EF: 55-65 %  Wall Motion: Normal  Coronary Anatomy:  Dominance: Right  Left Main: Short, large-caliber vessel. Widely patent. Bifurcates into the LAD and Circumflex. Angiographically normal. LAD: Large-caliber vessel that wraps the apex. Diffuse mild luminal irregularities but no significant obstruction. One major proximal diagonal branch and several smaller diagonal branches. No significant disease.  Left Circumflex: Large-caliber, nondominant vessel that gives off a small caliber AV groove branch. Widely patent stent in this segment. Just distal to this the vessel bifurcates into OM1 and OM 2. O2 is relatively free of disease but OM1 the bifurcation to the stent has roughly 60% stenosis and there is 99% in-stent restenosis of the bare-metal stent in OM 2. Distally OM 2 has relatively no significant disease beyond the stent.   RCA: Large-caliber, dominant vessel with a widely patent proximal stent that has maybe 5-10% in-stent restenosis. The vessel then has a  roughly 30-40% focal stenosis just after the crux before bifurcates distally  into the Right Posterior Descending Artery (RPDA) and the Right Posterior AV Groove Branch (RPAV). Otherwise mild luminal irregularities.  RPDA: Moderate caliber vessel which does not reach all the way to the apex. Mild diffuse luminal irregularities   RPL Sysytem:The RPAV begins as a moderate caliber vessel that gives off one major and one minor posterior lateral branch.  After reviewing the initial angiography, the culprit lesion was thought to be the severe in-stent restenosis in the OM 2 bare-metal stent with progression of disease in the intervening segment between the circumflex and OM 2 stent at the OM1 bifurcation.  Preparation were made to proceed with PCI on this lesion.  Percutaneous Coronary Intervention:   Guide: 6 Fr   CLS 3.0 Guidewire: BMW for OM 2 and Prowater for OM1 Predilation Balloon: Euphora 2.5 mm x 15 mm;   10 Atm x 20 Sec, 12 Atm x 25 Sec - despite multiple attempts inflating the proximal edge of the OM 2 stent, the balloon continued to watermelon seed either proximally or distally. Therefore the plan was made to use a cutting balloon. Predilation Balloon: FlexTome Cutting Balloon 2.5 mm x 10 mm;   12 Atm x 40 Sec - at the bifurcation , 8 Atm x 30 Sec for in-stent restenosis Stent: Xience Alpine DES 3.25 mm x 28 mm;  stents band from the distal portion of the circumflex stent through the bare-metal stents in OM2 just distally.  16 Atm x 30 Sec -- distal diameter 3.45 mm Post-dilation Balloon: [place] Euphora 3.5 mm x 15 mm;   18 Atm x 30 Sec - 3 inflations from distal to proximal  Final Diameter: ~3.7 mm  Post deployment angiography in multiple views, with and without guidewire in place revealed excellent stent deployment and lesion coverage.  There was no evidence of dissection or perforation.  Sheath removed in the Cardiac Catheterization Laboratory with TR Band Placement for Nonocclusive hemostasis.  TR Band: 1405  Hours; 12 mL air  MEDICATIONS:  Anesthesia:   Local Lidocaine 2 ml  Sedation:  1 mg IV Versed, 75 mcg IV fentanyl ;   Omnipaque Contrast: 210 ml  Anticoagulation:  IV Heparin 6000 unit initial bolus followed by 4000 and 2000 unit boluses =  12,000 Units  Anti-Platelet Agent:  Effient 20 mg, he has been on Effient daily, but did not take it today.  Aspirin 81 mg Radial Cocktail: 5 mg Verapamil, 400 mcg NTG, 2 ml 2% Lidocaine in 10 ml NS Intracoronary nitroglycerin 200 g  PATIENT DISPOSITION:    The patient was transferred to the PACU holding area in a hemodynamicaly stable, chest pain free condition.  The patient tolerated the procedure well, and there were no complications.  EBL:   < 10 ml  The patient was stable before, during, and after the procedure.  POST-OPERATIVE DIAGNOSIS:    Severe single-vessel disease involving the bifurcation of OM1 and OM 2 with 99% in-stent restenosis in the bare-metal stent placed in the OM 2.  Successful PCI spanning the proximal circumflex stent across OM 1 through the bare-metal stent placed in OM 2 with a Xience alpine DES stent  Widely patent stent in the RCA and proximal circumflex with otherwise mild-moderate disease.  Preserved LVEF with mildly elevated LVEDP  PLAN OF CARE:  The patient was monitored overnight for standard post radial cath care. He already has Effient and has been on it with the exception of missing  maybe 5 doses since February. He should be fine with drug diluting stent placement  Anticipate discharge the morning  Continue cardiac risk factor modification including smoking cessation counseling    Leonie Man, M.D., M.S. Interventional Cardiologist   Pager # [phone]

## 2014-08-01 NOTE — Interval H&P Note (Signed)
History and Physical Interval Note:  08/01/2014 12:20 PM  [name] [name]  has presented today for surgery, with the diagnosis of Class II-III Angina with Intermediate Risk Nuclear Stress TestThe various methods of treatment have been discussed with the patient and family. After consideration of risks, benefits and other options for treatment, the patient has consented to  Procedure(s): LEFT HEART CATHETERIZATION WITH CORONARY ANGIOGRAM (N/A) +/- PCI as a surgical intervention .  The patient's history has been reviewed, patient examined, no change in status, stable for surgery.  I have reviewed the patient's chart and labs.  Questions were answered to the patient's satisfaction.    The procedure with Risks/Benefits/Alternatives and Indications was reviewed with the patient.  All questions were answered.    Risks / Complications include, but not limited to: Death, MI, CVA/TIA, VF/VT (with defibrillation), Bradycardia (need for temporary pacer placement), contrast induced nephropathy, bleeding / bruising / hematoma / pseudoaneurysm, vascular or coronary injury (with possible emergent CT or Vascular Surgery), adverse medication reactions, infection.  Additional risks involving the use of radiation with the possibility of radiation burns and cancer were explained in detail.  The patient voice understanding and agree to proceed.    Cath Lab Visit (complete for each Cath Lab visit)  Clinical Evaluation Leading to the Procedure:   ACS: No.  Non-ACS:    Anginal Classification: CCS II  Anti-ischemic medical therapy: Minimal Therapy (1 class of medications)  Non-Invasive Test Results: Intermediate-risk stress test findings: cardiac mortality 1-3%/year  Prior CABG: No previous CABG  [name] [name],[name] [name]

## 2014-08-01 NOTE — H&P (View-Only) (Signed)
07/28/2014 [name] [name]   1961/03/31  [id number]  Primary Physician [name], MD Primary Cardiologist: [name]  HPI:  The patient is a 53 y/o male, followed by [name]. His past medical history is significant for CAD, poorly controlled diabetes with most recent hemoglobin A1c of 11.2, obesity, tobacco use (1/2 ppd), hyperlipidemia and hypertension. He is status post stenting of the right coronary artery in May of 2013. He was recently admitted with a NSTEMI inFebruary of this year and was found to have severe disease in the LCx. This was treated with a BMS. He was placed on dual antiplatelet therapy with aspirin plus Effient.   I evaluated him in clinic on 06/09/2014 for routine follow-up. At that time, he reported that he continued to have intermittent episodes of substernal chest pressure that would occur both at rest and with physical activity. He reported that his symptoms were nitrate responsive and he admitted to using sublingual nitroglycerin frequently, at least 1-2 times on a weekly basis. His EKG during that visit demonstrated normal sinus rhythm with nonspecific T-wave abnormalities, however compared to prior EKGs there were no significant changes. Given his history of CAD as well as multiple risk factors including continued tobacco abuse, hyperlipidemia, hypertension and poorly controlled diabetes, I recommended that he undergo repeat ischemic evaluation with a nuclear stress test. This was a myocardial perfusion study that was interpreted as an "intermediate risk" study. There is evidence of a large area of ischemia in the mid-basal inferior lateral walls. LV ejection fraction was estimated at 52%. The results were reported to his primary cardiologist, [name], who recommended the patient presented back to clinic to discuss repeat left heart catheterization.  The patient presents to clinic today for follow-up. He is currently chest pain-free but states that he continues to  have intermittent episodes of substernal chest discomfort. His pain continues to be relieved with sublingual nitroglycerin, however he states that he only has 3 tablets left and is in need of refills. He reports full medication compliance with aspirin and Effient, as well as all of his other prescribed medications. Unfortunately, he continues to smoke cigarettes. He has been compliant with his insulin for his diabetes which is followed by his PCP.  EKG today demonstrates normal sinus rhythm.  His blood pressure is stable at 122/68.   Current Outpatient Prescriptions  Medication Sig Dispense Refill  . ACCU-CHEK FASTCLIX LANCETS MISC 1 Units by Percutaneous route 4 (four) times daily. 100 each 12  . albuterol (PROVENTIL HFA;VENTOLIN HFA) 108 (90 BASE) MCG/ACT inhaler Inhale 2 puffs into the lungs every 6 (six) hours as needed for wheezing or shortness of breath. 1 Inhaler 11  . aspirin 81 MG chewable tablet Chew 1 tablet (81 mg total) by mouth daily. 180 tablet 2  . atenolol (TENORMIN) 25 MG tablet Take 1 tablet (25 mg total) by mouth daily. 30 tablet 11  . insulin aspart (NOVOLOG) 100 UNIT/ML injection Inject 20 Units into the skin 3 (three) times daily with meals.    . insulin glargine (LANTUS) 100 UNIT/ML injection Inject 0.35 mLs (35 Units total) into the skin 2 (two) times daily. 10 mL 3  . Insulin Syringe-Needle U-100 (FREESTYLE PRECISION INS SYR) 31G X 5/16" 1 ML MISC 1 Units by Does not apply route as directed. Dx: Diabetes 250.02.  Dispense:  QS for 5 shots daily - 1 month supply 100 each prn  . Liraglutide 18 MG/3ML SOPN Inject 1.8 mg into the skin daily. Dispense QS  for 1 month supply 3 pen 11  . lisinopril (PRINIVIL,ZESTRIL) 5 MG tablet Take 0.5 tablets (2.5 mg total) by mouth 2 (two) times daily. 60 tablet 11  . metFORMIN (GLUCOPHAGE) 1000 MG tablet Take 1 tablet (1,000 mg total) by mouth 2 (two) times daily with a meal. 60 tablet 11  . mometasone-formoterol (DULERA) 200-5 MCG/ACT AERO  Inhale 2 puffs into the lungs 2 (two) times daily. Dispense QS for 1 month supply. 1 Inhaler 0  . nitroGLYCERIN (NITROSTAT) 0.4 MG SL tablet Place 1 tablet (0.4 mg total) under the tongue every 5 (five) minutes x 3 doses as needed for chest pain. 25 tablet 1  . Omega-3 Fatty Acids (FISH OIL PO) Take 1 capsule by mouth 2 (two) times daily.     . prasugrel (EFFIENT) 10 MG TABS tablet Take 1 tablet (10 mg total) by mouth daily. 30 tablet 0  . simvastatin (ZOCOR) 20 MG tablet Take 1 tablet (20 mg total) by mouth every evening. 30 tablet 11  . traMADol (ULTRAM) 50 MG tablet Take 50 mg by mouth every 6 (six) hours as needed for severe pain.     No current facility-administered medications for this visit.    Allergies  Allergen Reactions  . Shellfish Allergy Nausea And Vomiting    History   Social History  . Marital Status: Single    Spouse Name: N/A    Number of Children: 0  . Years of Education: 15   Occupational History  . Unemployed     Used to work for [employer]  . Student, computer tech [employer]    Social History Main Topics  . Smoking status: Current Some Day Smoker -- 0.50 packs/day for 35 years    Types: Cigarettes  . Smokeless tobacco: Never Used  . Alcohol Use: 14.4 oz/week    24 Cans of beer per week     Comment: 11/07/2013 "12 beers twice a week"  . Drug Use: No  . Sexual Activity: Not Currently   Other Topics Concern  . Not on file   Social History Narrative   Single.  Lives with a roommate.  Ambulates independently.     Review of Systems: General: negative for chills, fever, night sweats or weight changes.  Cardiovascular: negative for chest pain, dyspnea on exertion, edema, orthopnea, palpitations, paroxysmal nocturnal dyspnea or shortness of breath Dermatological: negative for rash Respiratory: negative for cough or wheezing Urologic: negative for hematuria Abdominal: negative for nausea, vomiting, diarrhea, bright red blood per rectum, melena, or  hematemesis Neurologic: negative for visual changes, syncope, or dizziness All other systems reviewed and are otherwise negative except as noted above.    Blood pressure 122/68, pulse 84, height 5' 7.5" (1.715 m), weight 269 lb 4.8 oz (122.154 kg).  General appearance: alert, cooperative and no distress Neck: no carotid bruit and no JVD Lungs: clear to auscultation bilaterally Heart: regular rate and rhythm, S1, S2 normal, no murmur, click, rub or gallop Extremities: no LEE Pulses: 2+ and symmetric Skin: warm and dry Neurologic: Grossly normal  EKG normal sinus rhythm. Heart rate 84 bpm.  ASSESSMENT AND PLAN:   1. Abnormal nuclear stress test: will arrange for left heart catheterization at [facility]  2. CAD/unstable angina: continue dual antiplatelet therapy with aspirin plus Effient, lisinopril, simvastatin, atenolol and sublingual nitroglycerin. Will order refills for SL nitroglycerin. Patient instructed to call 911 if symptoms fail to improve with nitroglycerin x 3. Plan outpatient left heart catheterization to redefine coronary anatomy.  3. Hypertension: Well-controlled.  4. Hyperlipidemia: Continue simvastatin.  5. Diabetes: Continue management per PCP.  6. Tobacco abuse: Smoking cessation strongly advised.  PLAN Will arrange outpatient LHC with [name], first available. For now, continue medical therapy.  Yader Criger, BRITTAINYPA-C 07/28/2014 4:29 PM

## 2014-08-01 NOTE — Progress Notes (Signed)
   08/01/14 2321  BiPAP/CPAP/SIPAP  BiPAP/CPAP/SIPAP Pt Type Adult  Mask Type Full face mask  Mask Size Medium  Respiratory Rate 17 breaths/min  IPAP 12 cmH20  EPAP 12 cmH2O  Oxygen Percent 21 %  BiPAP/CPAP/SIPAP CPAP  Patient Home Equipment No  Auto Titrate No  BiPAP/CPAP /SiPAP Vitals  Resp 17  SpO2 94 %  Patient placed on CPAP on above settings. He tolerates it very well at this time.

## 2014-08-02 ENCOUNTER — Encounter (HOSPITAL_COMMUNITY): Payer: Self-pay | Admitting: Adult Health

## 2014-08-02 DIAGNOSIS — I208 Other forms of angina pectoris: Secondary | ICD-10-CM

## 2014-08-02 DIAGNOSIS — T82897A Other specified complication of cardiac prosthetic devices, implants and grafts, initial encounter: Secondary | ICD-10-CM | POA: Diagnosis not present

## 2014-08-02 DIAGNOSIS — I25110 Atherosclerotic heart disease of native coronary artery with unstable angina pectoris: Secondary | ICD-10-CM | POA: Diagnosis not present

## 2014-08-02 DIAGNOSIS — I10 Essential (primary) hypertension: Secondary | ICD-10-CM | POA: Diagnosis not present

## 2014-08-02 DIAGNOSIS — Z9861 Coronary angioplasty status: Secondary | ICD-10-CM

## 2014-08-02 DIAGNOSIS — R931 Abnormal findings on diagnostic imaging of heart and coronary circulation: Secondary | ICD-10-CM

## 2014-08-02 DIAGNOSIS — I2510 Atherosclerotic heart disease of native coronary artery without angina pectoris: Secondary | ICD-10-CM

## 2014-08-02 DIAGNOSIS — E1165 Type 2 diabetes mellitus with hyperglycemia: Secondary | ICD-10-CM | POA: Diagnosis not present

## 2014-08-02 LAB — CBC
HEMATOCRIT: 40.8 % (ref 39.0–52.0)
HEMOGLOBIN: 13.9 g/dL (ref 13.0–17.0)
MCH: 30.1 pg (ref 26.0–34.0)
MCHC: 34.1 g/dL (ref 30.0–36.0)
MCV: 88.3 fL (ref 78.0–100.0)
Platelets: 232 10*3/uL (ref 150–400)
RBC: 4.62 MIL/uL (ref 4.22–5.81)
RDW: 12.8 % (ref 11.5–15.5)
WBC: 6.5 10*3/uL (ref 4.0–10.5)

## 2014-08-02 LAB — BASIC METABOLIC PANEL
ANION GAP: 14 (ref 5–15)
BUN: 12 mg/dL (ref 6–23)
CO2: 22 meq/L (ref 19–32)
CREATININE: 0.75 mg/dL (ref 0.50–1.35)
Calcium: 8.9 mg/dL (ref 8.4–10.5)
Chloride: 101 mEq/L (ref 96–112)
GFR calc non Af Amer: >90 mL/min (ref >90)
Glucose, Bld: 151 mg/dL — ABNORMAL HIGH (ref 70–99)
POTASSIUM: 4.2 meq/L (ref 3.7–5.3)
SODIUM: 137 meq/L (ref 137–147)

## 2014-08-02 LAB — GLUCOSE, CAPILLARY: Glucose-Capillary: 164 mg/dL — ABNORMAL HIGH (ref 70–99)

## 2014-08-02 MED ORDER — ATORVASTATIN CALCIUM 80 MG PO TABS: 80.0000 mg | ORAL_TABLET | Freq: Every day | ORAL | Status: DC

## 2014-08-02 NOTE — Progress Notes (Signed)
Consulting cardiologist: Ellyn Hack. Shanon Brow MD Primary Cardiologist; Martinique, Peter MD  Cardiology Specific Problem List: 1. CAD-Stent placement in 10/2013 2. S/P PCI with in-stent restenosis of OM 2. 3. Hypertension    Subjective:   Feels good. No complaints of chest pain, pain in his wrist, or dyspnea.   Objective:   Temp:  [97.6 F (36.4 C)-98.5 F (36.9 C)] 98.3 F (36.8 C) (11/21 0734) Pulse Rate:  [66-85] 74 (11/21 0734) Resp:  [17-18] 18 (11/21 0734) BP: (106-158)/(57-88) 127/57 mmHg (11/21 0734) SpO2:  [94 %-95 %] 94 % (11/21 0734) Weight:  [269 lb (122.018 kg)-271 lb 9.7 oz (123.2 kg)] 271 lb 9.7 oz (123.2 kg) (11/21 0000) Last BM Date: 08/01/14  Filed Weights   08/01/14 0800 08/02/14 0000  Weight: 269 lb (122.018 kg) 271 lb 9.7 oz (123.2 kg)    Intake/Output Summary (Last 24 hours) at 08/02/14 0747 Last data filed at 08/02/14 0734  Gross per 24 hour  Intake 1504.63 ml  Output      0 ml  Net 1504.63 ml    Telemetry: NSR  Exam:  General: No acute distress.  HEENT: Conjunctiva and lids normal, oropharynx clear.  Lungs: Clear to auscultation, nonlabored.  Cardiac: No elevated JVP or bruits. RRR 1/6 systolic murmur, no gallop or rub.   Abdomen: Normoactive bowel sounds, nontender, nondistended. Obese  Extremities: No pitting edema, distal pulses full. Right wrist catheter incision site is healthy.   Neuropsychiatric: Alert and oriented x3, affect appropriate.   Lab Results:  Basic Metabolic Panel:  Recent Labs Lab 07/28/14 1653 08/02/14 0443  NA 137 137  K 4.5 4.2  CL 100 101  CO2 25 22  GLUCOSE 198* 151*  BUN 18 12  CREATININE 0.80 0.75  CALCIUM 9.5 8.9    Liver Function Tests:  Recent Labs Lab 07/28/14 1653  AST 14  ALT 19  ALKPHOS 105  BILITOT 0.4  PROT 7.2  ALBUMIN 4.1    CBC:  Recent Labs Lab 07/28/14 1653 08/02/14 0443  WBC 9.3 6.5  HGB 14.7 13.9  HCT 42.3 40.8  MCV 88.1 88.3  PLT 281 232    BNP:  Recent  Labs  11/07/13 0602  PROBNP 89.7    Coagulation:  Recent Labs Lab 07/28/14 1653  INR 0.96     Medications:   Scheduled Medications: . aspirin  81 mg Oral Daily  . atenolol  25 mg Oral Daily  . insulin aspart  0-20 Units Subcutaneous TID WC  . insulin aspart  0-5 Units Subcutaneous QHS  . insulin glargine  35 Units Subcutaneous BID  . lisinopril  2.5 mg Oral BID  . mometasone-formoterol  2 puff Inhalation BID  . prasugrel  10 mg Oral Daily  . simvastatin  20 mg Oral QPM       PRN Medications: acetaminophen, albuterol, morphine injection, nitroGLYCERIN, ondansetron (ZOFRAN) IV, traMADol, zolpidem   Assessment and Plan:   1. CAD: S/P Cardiac Cath 08/01/2014: Severe single vessel disease involving the bifurcation of OM1 and OM 2 with 99% in-stent restenosis in the bare-metal stent placed in the OM 2.Successful PCI spanning the proximal circumflex stent across OM 1 through the bare-metal stent placed in OM 2 with a Xience alpine DES stent.  Continue ASA, Effient, ACE, Statin. BB.. Consider changing to Crestor on follow up due to in-stent restenosis.   2. Hypertension: BP is well controlled. Continue current medication regimen. Follow up with [name].   Phill Myron. Purcell Nails NP [place]  08/02/2014, 7:47 AM   As above, patient seen and examined. Patient denies chest pain or dyspnea. Doing well status post PCI of second obtuse marginal. Plan to discharge today on aspirin, effient, Beta blocker and ACE inhibitor. Discontinue Zocor. Begin Lipitor 80 mg daily. Check lipids and liver in 4 weeks. Follow-up with [name] in 2-4 weeks. Greater than 30 minutes APP and physician time. D2Kirk Ruths

## 2014-08-02 NOTE — Progress Notes (Signed)
CARDIAC REHAB PHASE I   PRE:  Rate/Rhythm: 75 sinus rhythm  BP:  Supine:   Sitting: 132/51  Standing:    SaO2: 94% ra  MODE:  Ambulation: 500 ft   POST:  Rate/Rhythem: 79 sinus rhythm  BP:  Supine:   Sitting: 118/79  Standing:    SaO2: 975 ra  811-850 Pt ambulated in hallway without difficulty. Pt denies chest pain or dyspnea, asymptomatic.  Pt education completed including importance of smoking cessation,  Plastic fake cigarette cessation tool given.  Pt instructed to call [phone].  Medication compliance stressed with patient in addition to low fat/low cholesterol diet, exercise, stress modification and when to call MD/911.  Pt oriented to outpatient cardaic rehab. At pt request, referral will be sent to [facility].  Pt accepting of education with known learning limitations.   Understanding verbalized.    Gino Garrabrant, [place]

## 2014-08-02 NOTE — Discharge Summary (Signed)
Physician Discharge Summary  Patient ID: [name] [name] MRN: [id number] DOB/AGE: [birth year] 53 y.o.  Admit date: 08/01/2014 Discharge date: 08/02/2014  Primary Discharge Diagnosis 1. CAD with instent re-stenosis of OM 1 BMS 2. S/P PCI of OM1  Secondary Discharge Diagnosis 1. Hypertension 2. Uncontrolled Diabetes 3. Hyperlipidemia   Primary Cardiologist: [place], Peter MD  Significant Diagnostic Studies: 1. Cardiac Cath Coronary Anatomy:  Dominance: Right  Left Main: Short, large-caliber vessel. Widely patent. Bifurcates into the LAD and Circumflex. Angiographically normal. LAD: Large-caliber vessel that wraps the apex. Diffuse mild luminal irregularities but no significant obstruction. One major proximal diagonal branch and several smaller diagonal branches. No significant disease.  Left Circumflex: Large-caliber, nondominant vessel that gives off a small caliber AV groove branch. Widely patent stent in this segment. Just distal to this the vessel bifurcates into OM1 and OM 2. O2 is relatively free of disease but OM1 the bifurcation to the stent has roughly 60% stenosis and there is 99% in-stent restenosis of the bare-metal stent in OM 2. Distally OM 2 has relatively no significant disease beyond the stent.   RCA: Large-caliber, dominant vessel with a widely patent proximal stent that has maybe 5-10% in-stent restenosis. The vessel then has a roughly 30-40% focal stenosis just after the crux before bifurcates distally into the Right Posterior Descending Artery (RPDA) and the Right Posterior AV Groove Branch (RPAV). Otherwise mild luminal irregularities.  RPDA: Moderate caliber vessel which does not reach all the way to the apex. Mild diffuse luminal irregularities   RPL Sysytem:The RPAV begins as a moderate caliber vessel that gives off one major and one minor posterior lateral branch.  PCI Data:  Successful PCI spanning the proximal circumflex stent across OM 1 through the  bare-metal stent placed in OM 2 with a Xience alpine DES stent  Widely patent stent in the RCA and proximal circumflex with otherwise mild-moderate disease.  Preserved LVEF with mildly elevated LVEDP   Hospital Course:   [name] [name] is a 53 y/o patient of Dr.Jordan admitted for cardiac cath in the setting of abnormal stress test with findings of inferolateral ischemia, with symptoms of unstable angina.. He has known hx of CAD with BMS to proximal RCA in 2013, BMS to CX and OM2 in 10/2013.     Cardiac cath performed by Dr. Herbie Baltimore on 08/01/2014 demonstrated severe single vessel disease at the bifurcation of the OM1 and OM@, with 99% in-stent restenosis in the BMS of OM2. He had PTCA with PCI using a BMS. He has had no recurrence of chest pain or angina symptoms. He walked with cardiac rehab this am. BP and heart rate remained stable.    He was seen and examined by Dr. Jens Som prior to discharge. Found to be stable for discharge. He will need follow up lipids and LFT's in 4 weeks. To be planned on follow visit with [name].   Discharge Exam: Blood pressure 127/57, pulse 74, temperature 98.3 F (36.8 C), temperature source Oral, resp. rate 18, height 5\' 7"  (1.702 m), weight 271 lb 9.7 oz (123.2 kg), SpO2 94 %.   Labs:   Lab Results  Component Value Date   WBC 6.5 08/02/2014   HGB 13.9 08/02/2014   HCT 40.8 08/02/2014   MCV 88.3 08/02/2014   PLT 232 08/02/2014     Recent Labs Lab 07/28/14 1653 08/02/14 0443  NA 137 137  K 4.5 4.2  CL 100 101  CO2 25 22  BUN 18 12  CREATININE 0.80  0.75  CALCIUM 9.5 8.9  PROT 7.2  --   BILITOT 0.4  --   ALKPHOS 105  --   ALT 19  --   AST 14  --   GLUCOSE 198* 151*   Lab Results  Component Value Date   CKTOTAL 138 02/18/2012   CKMB 2.2 02/18/2012   TROPONINI 1.92* 11/08/2013    Lab Results  Component Value Date   CHOL 152 01/20/2014   CHOL 130 08/24/2012   CHOL 171 01/19/2012   Lab Results  Component Value Date   HDL 32.80*  01/20/2014   HDL 32* 08/24/2012   HDL 25* 01/19/2012   Lab Results  Component Value Date   LDLCALC 86 01/20/2014   LDLCALC 59 08/24/2012   LDLCALC 84 01/19/2012   Lab Results  Component Value Date   TRIG 164.0* 01/20/2014   TRIG 193* 08/24/2012   TRIG 312* 01/19/2012   Lab Results  Component Value Date   CHOLHDL 5 01/20/2014   CHOLHDL 4.1 08/24/2012   CHOLHDL 6.8 01/19/2012   No results found for: LDLDIRECT     EKG: NSR 74 bpm.   NSR with rate of   FOLLOW UP PLANS AND APPOINTMENTS     Discharge Instructions    Diet - low sodium heart healthy    Complete by:  As directed      Increase activity slowly    Complete by:  As directed             Medication List    STOP taking these medications        simvastatin 20 MG tablet  Commonly known as:  ZOCOR      TAKE these medications        ACCU-CHEK FASTCLIX LANCETS Misc  1 Units by Percutaneous route 4 (four) times daily.     acetaminophen 500 MG tablet  Commonly known as:  TYLENOL  Take 2 tablets (1,000 mg total) by mouth every 6 (six) hours as needed for mild pain or moderate pain.     albuterol 108 (90 BASE) MCG/ACT inhaler  Commonly known as:  PROVENTIL HFA;VENTOLIN HFA  Inhale 2 puffs into the lungs every 6 (six) hours as needed for wheezing or shortness of breath.     aspirin 81 MG chewable tablet  Chew 1 tablet (81 mg total) by mouth daily.     atenolol 25 MG tablet  Commonly known as:  TENORMIN  Take 1 tablet (25 mg total) by mouth daily.     atorvastatin 80 MG tablet  Commonly known as:  LIPITOR  Take 1 tablet (80 mg total) by mouth daily.     FISH OIL PO  Take 1 capsule by mouth 2 (two) times daily.     insulin aspart 100 UNIT/ML injection  Commonly known as:  novoLOG  Inject 25 Units into the skin 3 (three) times daily with meals.     insulin glargine 100 UNIT/ML injection  Commonly known as:  LANTUS  Inject 0.35 mLs (35 Units total) into the skin 2 (two) times daily.     Insulin  Syringe-Needle U-100 31G X 5/16" 1 ML Misc  Commonly known as:  FREESTYLE PRECISION INS SYR  1 Units by Does not apply route as directed. Dx: Diabetes 250.02.  Dispense:  QS for 5 shots daily - 1 month supply     Liraglutide 18 MG/3ML Sopn  Inject 1.8 mg into the skin daily. Dispense QS for 1 month supply  lisinopril 5 MG tablet  Commonly known as:  PRINIVIL,ZESTRIL  Take 0.5 tablets (2.5 mg total) by mouth 2 (two) times daily.     metFORMIN 1000 MG tablet  Commonly known as:  GLUCOPHAGE  Take 1 tablet (1,000 mg total) by mouth 2 (two) times daily with a meal.     mometasone-formoterol 200-5 MCG/ACT Aero  Commonly known as:  DULERA  Inhale 2 puffs into the lungs 2 (two) times daily. Dispense QS for 1 month supply.     nitroGLYCERIN 0.4 MG SL tablet  Commonly known as:  NITROSTAT  Place 1 tablet (0.4 mg total) under the tongue every 5 (five) minutes x 3 doses as needed for chest pain.     prasugrel 10 MG Tabs tablet  Commonly known as:  EFFIENT  Take 1 tablet (10 mg total) by mouth daily.     traMADol 50 MG tablet  Commonly known as:  ULTRAM  Take 50 mg by mouth every 6 (six) hours as needed for severe pain.       Follow-up Information    Follow up with Peter Swazil[place], MD.   Specialty:  Cardiology   Why:  Our office will call you for appt with [name] or PA for hospital follow up.    Contact information:   [address] [place] [address] [phone]       Time spent with patient to include physician time: 30 minutes.   Signed: Bettey Mare. Chirsty Armistead NP [facility]  08/02/2014, 8:31 AM Co-Sign MD

## 2014-08-02 NOTE — Discharge Instructions (Signed)
Coronary Angiogram With Stent, Care After °Refer to this sheet in the next few weeks. These instructions provide you with information on caring for yourself after your procedure. Your health care provider may also give you more specific instructions. Your treatment has been planned according to current medical practices, but problems sometimes occur. Call your health care provider if you have any problems or questions after your procedure.  °WHAT TO EXPECT AFTER THE PROCEDURE  °The insertion site may be tender for a few days after your procedure. °HOME CARE INSTRUCTIONS  °· Take medicines only as directed by your health care provider. Blood thinners may be prescribed after your procedure to improve blood flow through the stent. °· Change any bandages (dressings) as directed by your health care provider.   °· Check your insertion site every day for redness, swelling, or fluid leaking from the insertion.   °· Do not take baths, swim, or use a hot tub until your health care provider approves. You may shower. Pat the insertion area dry. Do not rub the insertion area with a washcloth or towel.   °· Eat a heart-healthy diet. This should include plenty of fresh fruits and vegetables. Meat should be lean cuts. Avoid the following types of food:   °¨ Food that is high in salt.   °¨ Canned or highly processed food.   °¨ Food that is high in saturated fat or sugar.   °¨ Fried food.   °· Make any other lifestyle changes recommended by your health care provider. This may include:   °¨ Not using any tobacco products including cigarettes, chewing tobacco, or electronic cigarettes.  °¨ Managing your weight.   °¨ Getting regular exercise.   °¨ Managing your blood pressure.   °¨ Limiting your alcohol intake.   °¨ Managing other health problems, such as diabetes.   °· If you need an MRI after your heart stent was placed, be sure to tell the health care provider who orders the MRI that you have a heart stent.   °· Keep all follow-up  visits as directed by your health care provider.   °SEEK IMMEDIATE MEDICAL CARE IF:  °· You develop chest pain, shortness of breath, feel faint, or pass out. °· You have bleeding, swelling larger than a walnut, or drainage from the catheter insertion site. °· You develop pain, discoloration, coldness, or severe bruising in the leg or arm that held the catheter. °· You develop bleeding from any other place such as from the bowels. There may be bright red blood in the urine or stools, or it may appear as black, tarry stools. °· You have a fever or chills. °MAKE SURE YOU: °· Understand these instructions. °· Will watch your condition. °· Will get help right away if you are not doing well or get worse. °Document Released: 03/18/2005 Document Revised: 01/13/2014 Document Reviewed: 01/30/2013 °Exit[employer]® Patient Information ©2015 [employer]. This information is not intended to replace advice given to you by your health care provider. Make sure you discuss any questions you have with your health care provider. ° °

## 2014-08-04 ENCOUNTER — Telehealth: Payer: Self-pay | Admitting: Cardiology

## 2014-08-04 MED ORDER — PRASUGREL HCL 10 MG PO TABS: 10.0000 mg | ORAL_TABLET | Freq: Every day | ORAL | Status: DC

## 2014-08-04 NOTE — Telephone Encounter (Signed)
Pt called in stating that he would like some samples of Effient. Please call if we have any  Thanks

## 2014-08-04 NOTE — Telephone Encounter (Signed)
Sample of effient provided. Patient notified samples are available for pick up

## 2014-08-11 ENCOUNTER — Telehealth: Payer: Self-pay | Admitting: Interventional Cardiology

## 2014-08-11 NOTE — Telephone Encounter (Signed)
TCM  Pt is TCM  Per After Hours 08/15/2014 @ 11am   Called and LVM to confirm appt//sr

## 2014-08-11 NOTE — Telephone Encounter (Signed)
lmtcb

## 2014-08-12 NOTE — Telephone Encounter (Signed)
Left message for patient to call back  

## 2014-08-13 NOTE — Telephone Encounter (Signed)
Patient contacted regarding discharge from hospital on 08/02/14.   Patient understands to follow up with Lesia Hausen on 08/15/14 at 11:00 am at [facility].  Patient understands discharge instructions? Yes  Patient understands medications and regiment? Yes Patient understands to bring all medications to this visit? Yes  Pt has not picked up lipitor, does not have money for copay.  Still has simvastatin, and is taking. Requests samples of effient, has some pills but will be running out soon.  Placed samples at front desk. He will pick up at his appointment Friday.

## 2014-08-13 NOTE — Telephone Encounter (Signed)
Returned call to patient he wanted to confirm appointment.Advised he has appointment with Lyda Jester PA at [facility] office 08/15/14 at 11:00 am.

## 2014-08-13 NOTE — Telephone Encounter (Signed)
Follow up     Returning call back to nurse Vaughan Basta

## 2014-08-15 ENCOUNTER — Ambulatory Visit: Payer: No Typology Code available for payment source | Admitting: Cardiology

## 2014-08-21 ENCOUNTER — Encounter (HOSPITAL_COMMUNITY): Payer: Self-pay | Admitting: Interventional Cardiology

## 2014-08-28 ENCOUNTER — Encounter: Payer: Self-pay | Admitting: Pharmacist

## 2014-08-28 ENCOUNTER — Ambulatory Visit (HOSPITAL_COMMUNITY)
Admission: RE | Admit: 2014-08-28 | Discharge: 2014-08-28 | Disposition: A | Discharge status: Home / Self Care | Payer: No Typology Code available for payment source | Source: Ambulatory Visit | Attending: Family Medicine | Admitting: Family Medicine

## 2014-08-28 ENCOUNTER — Ambulatory Visit (INDEPENDENT_AMBULATORY_CARE_PROVIDER_SITE_OTHER): Payer: Self-pay | Admitting: Pharmacist

## 2014-08-28 VITALS — BP 118/67 | HR 66 | Wt 266.0 lb

## 2014-08-28 DIAGNOSIS — E119 Type 2 diabetes mellitus without complications: Secondary | ICD-10-CM

## 2014-08-28 DIAGNOSIS — M13832 Other specified arthritis, left wrist: Secondary | ICD-10-CM | POA: Insufficient documentation

## 2014-08-28 DIAGNOSIS — M25532 Pain in left wrist: Secondary | ICD-10-CM

## 2014-08-28 DIAGNOSIS — I214 Non-ST elevation (NSTEMI) myocardial infarction: Secondary | ICD-10-CM

## 2014-08-28 DIAGNOSIS — E785 Hyperlipidemia, unspecified: Secondary | ICD-10-CM

## 2014-08-28 DIAGNOSIS — Z720 Tobacco use: Secondary | ICD-10-CM

## 2014-08-28 MED ORDER — PRASUGREL HCL 10 MG PO TABS: 10.0000 mg | ORAL_TABLET | Freq: Every day | ORAL | Status: DC

## 2014-08-28 NOTE — Assessment & Plan Note (Signed)
Known Coronary Artery disease appears adherent with Effient (prasugrel)- provided new paper prescription for 30 day supply today in office to allow patient to obtain 30 more Effient for free.   Patient was appreciative.

## 2014-08-28 NOTE — Assessment & Plan Note (Signed)
Nonadherent with atorvastatin due to inability to be $10 copay.  Encouraged purchase.  Patient verbalized he will try to obtain.

## 2014-08-28 NOTE — Assessment & Plan Note (Signed)
Diabetes improved control with current basal/bolus insulin and liraglutide regimen.  Denies hypoglycemic events and is able to verbalize appropriate hypoglycemia management plan.  Reports adherence with medication.  Patient is much improved with glycemic control goals since previous visit including wight loss since previous visit.  No change in diabetes medication at this time.

## 2014-08-28 NOTE — Progress Notes (Signed)
S:    Patient arrives in good spirits.  He arrives > 30 minutes late due to missing the bus.   He had appropriately contacted the office and let us know he was going to be late due to his transportation issue.  Upon arrival he obviously is minimizing the use of his left wrist and hand.  He states "my wrist is still hurting and the tylenol is not helping".   He would like to have more work-up and asks if he  He presents for diabetes follow and has his blood glucose meter with once or twice daily testing results over the last few weeks.  Denies missing doses of his diabetes medications recently.  Desires to have a paper prescription for Effient (prasugrel) written today so that he can utilize a "free month supply" voucher that expires at the end of this month.   O:  Lab Results  Component Value Date   HGBA1C 8.5 07/08/2014    home fasting/random CBG readings of 170-220   Left Wrist evaluation by Dr. Gus Puma Weights   08/28/14 1158  Weight: 266 lb (120.657 kg)     A/P: Diabetes improved control with current basal/bolus insulin and liraglutide regimen.  Denies hypoglycemic events and is able to verbalize appropriate hypoglycemia management plan.  Reports adherence with medication.  Patient is much improved with glycemic control goals since previous visit including wight loss since previous visit.  No change in diabetes medication at this time.   Chronic nicotine abuse history with 3 days of complete abstinence and repeated trials of tobacco cessation with maximal number of days quit of 4.   Encouraged to attempt quitting for 5 or 6 days straight to avoid the pressure of quitting for 7 days. Patient will continue to try and abstain from smoking for 5 total days in the next few days.    Left Wrist pain - deferred and evaluated by Dr. Mingo Amber.   X-ray of hand/wrist ordered for evaluation.  Patient was instructed that a visit to [place] would be appropriate if no fracture  was identified.   Known Coronary Artery disease appears adherent with Effient (prasugrel)- provided new paper prescription for 30 day supply today in office to allow patient to obtain 30 more Effient for free.   Patient was appreciative.   Written patient instructions provided.  Follow up in Pharmacist Clinic Visit following PCP visit with Dr. Kennon Rounds in early January.   Total time in face to face counseling 35 minutes.

## 2014-08-28 NOTE — Assessment & Plan Note (Signed)
Chronic nicotine abuse history with 3 days of complete abstinence and repeated trials of tobacco cessation with maximal number of days quit of 4.   Encouraged to attempt quitting for 5 or 6 days straight to avoid the pressure of quitting for 7 days. Patient will continue to try and abstain from smoking for 5 total days in the next few days.

## 2014-08-28 NOTE — Progress Notes (Signed)
Patient ID: [name] [name], male   DOB: 09/05/1961, 53 y.o.   MRN: [id number] Reviewed: Agree with Dr. Koval's documentation and management. 

## 2014-08-28 NOTE — Patient Instructions (Signed)
Today - Great to see you!  Please fill your prescription for Effient with your new prescription.    Please get your hand/wrist X-ray today at the hospital and then we will contact you with result.   Next visit with Dr. Kennon Rounds - schedule in early January (before the 12th if possible).

## 2014-08-29 NOTE — Progress Notes (Addendum)
Precepting, called to see patient by Dr. Valentina Lucks.  Complaints of Left wrist pain x several weeks.  Persisted to the point where he has difficulty turning his wrist, turning doorknob, etc.  Worse when trying to left objects.  Some swelling.  No redness.  Has tried OTC analgesics without some relief.   O: Gen:  Alert, cooperative patient who appears stated age in no acute distress.  Vital signs reviewed. MSK:  Right wrist WNL.  Left wrist with some mild swelling throughout.  No redness.  TTP along medial aspect of wrist, area of radial styloid.  Also tnder along extensor pollicis brevis and abductor pollicis longus.  No snuffbox tenderness.  Good strength opposition, but does have pain.  No thenar hypo/hypertrophy. Psych:  Linear thought process. Does seem to have some halting speech and difficulty understanding multi-syllabic words consistent with cognitive delay  Imp/Plan: 1. 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 - If negative x-ray, will send to [facility] next week for U/S - Cont OTC analgesia for now as helping his pain.   - I also refilled his Prasugrel, he had a coupon for this.

## 2014-09-11 ENCOUNTER — Encounter: Payer: Self-pay | Admitting: Cardiology

## 2014-09-11 ENCOUNTER — Ambulatory Visit (INDEPENDENT_AMBULATORY_CARE_PROVIDER_SITE_OTHER): Payer: No Typology Code available for payment source | Admitting: Cardiology

## 2014-09-11 VITALS — BP 120/70 | HR 70 | Ht 67.0 in | Wt 272.0 lb

## 2014-09-11 DIAGNOSIS — R5383 Other fatigue: Secondary | ICD-10-CM

## 2014-09-11 DIAGNOSIS — Z95820 Peripheral vascular angioplasty status with implants and grafts: Secondary | ICD-10-CM

## 2014-09-11 DIAGNOSIS — Z9889 Other specified postprocedural states: Secondary | ICD-10-CM

## 2014-09-11 NOTE — Patient Instructions (Signed)
Your physician recommends that you schedule a follow-up appointment in: 6 months with Dr. Jordan 

## 2014-09-11 NOTE — Progress Notes (Signed)
09/11/2014 [name] [name]   1960/09/21  [id number]  Primary Physician [name], MD Primary Cardiologist: [name]  HPI:  The patient presents to clinic today for post cardiac catheterization follow-up. The patient is a 53 y/o male, followed by [name]. His past medical history is significant for CAD, poorly controlled diabetes with most recent hemoglobin A1c of 11.2, obesity, tobacco use (1/2 ppd), hyperlipidemia and hypertension. He is status post stenting of the right coronary artery in May of 2013. He was recently admitted with a NSTEMI inFebruary of this year and was found to have severe disease in the LCx. This was treated with a BMS. He was placed on dual antiplatelet therapy with aspirin plus Effient.   I evaluated him in clinic on 06/09/2014 for routine follow-up. At that time, he reported that he continued to have intermittent episodes of substernal chest pressure that would occur both at rest and with physical activity. He reported that his symptoms were nitrate responsive and he admitted to using sublingual nitroglycerin frequently, at least 1-2 times on a weekly basis. His EKG during that visit demonstrated normal sinus rhythm with nonspecific T-wave abnormalities, however compared to prior EKGs there were no significant changes. Given his history of CAD as well as multiple risk factors including continued tobacco abuse, hyperlipidemia, hypertension and poorly controlled diabetes, I recommended that he undergo repeat ischemic evaluation with a nuclear stress test. This was a myocardial perfusion study that was interpreted as an "intermediate risk" study. There was evidence of a large area of ischemia in the mid-basal inferior lateral walls. LV ejection fraction was estimated at 52%. Subsequently, he was referred for repeat left heart catheterization. This was performed by Dr. Herbie Baltimore on 08/01/2014. He was found to have severe single-vessel disease involving the bifurcation of OM1  and OM 2 with 99% in-stent restenosis in the bare-metal stent placed in the OM 2. He underwent successful PCI spanning the proximal circumflex stent across OM 1 through the bare-metal stent placed in OM 2 with a Xience alpine DES stent. He was also noted to have a widely patent stent in the RCA and proximal circumflex with otherwise mild-moderate disease. He has preserved LVEF with an ejection fraction of 55-65%. He was instructed to continue with DAPT with ASA + Effient.   He presents back to clinic today for post hospital follow-up. Since undergoing the procedure, he denies any recurrent anginal symptoms. He reports that he's been fully compliant with dual antiplatelet therapy. He denies any abnormal bleeding. He has also been fully compliant with all of his other medications. Unfortunately he continues to smoke but reports that he has made efforts to cut back. He averages 2 packs per week.   Current Outpatient Prescriptions  Medication Sig Dispense Refill  . ACCU-CHEK FASTCLIX LANCETS MISC 1 Units by Percutaneous route 4 (four) times daily. 100 each 12  . acetaminophen (TYLENOL) 500 MG tablet Take 2 tablets (1,000 mg total) by mouth every 6 (six) hours as needed for mild pain or moderate pain. 30 tablet 0  . albuterol (PROVENTIL HFA;VENTOLIN HFA) 108 (90 BASE) MCG/ACT inhaler Inhale 2 puffs into the lungs every 6 (six) hours as needed for wheezing or shortness of breath. 1 Inhaler 11  . aspirin 81 MG chewable tablet Chew 1 tablet (81 mg total) by mouth daily. 180 tablet 2  . atenolol (TENORMIN) 25 MG tablet Take 1 tablet (25 mg total) by mouth daily. 30 tablet 11  . atorvastatin (LIPITOR) 80 MG tablet Take 1  tablet (80 mg total) by mouth daily. 30 tablet 11  . insulin aspart (NOVOLOG) 100 UNIT/ML injection Inject 25 Units into the skin 3 (three) times daily with meals.    . insulin glargine (LANTUS) 100 UNIT/ML injection Inject 0.35 mLs (35 Units total) into the skin 2 (two) times daily. 10 mL 3  .  Insulin Syringe-Needle U-100 (FREESTYLE PRECISION INS SYR) 31G X 5/16" 1 ML MISC 1 Units by Does not apply route as directed. Dx: Diabetes 250.02.  Dispense:  QS for 5 shots daily - 1 month supply 100 each prn  . Liraglutide 18 MG/3ML SOPN Inject 1.8 mg into the skin daily. Dispense QS for 1 month supply 3 pen 11  . lisinopril (PRINIVIL,ZESTRIL) 5 MG tablet Take 0.5 tablets (2.5 mg total) by mouth 2 (two) times daily. 60 tablet 11  . metFORMIN (GLUCOPHAGE) 1000 MG tablet Take 1 tablet (1,000 mg total) by mouth 2 (two) times daily with a meal. 60 tablet 11  . mometasone-formoterol (DULERA) 200-5 MCG/ACT AERO Inhale 2 puffs into the lungs 2 (two) times daily. Dispense QS for 1 month supply. 1 Inhaler 0  . nitroGLYCERIN (NITROSTAT) 0.4 MG SL tablet Place 1 tablet (0.4 mg total) under the tongue every 5 (five) minutes x 3 doses as needed for chest pain. 25 tablet 1  . Omega-3 Fatty Acids (FISH OIL PO) Take 1 capsule by mouth 2 (two) times daily.     . prasugrel (EFFIENT) 10 MG TABS tablet Take 1 tablet (10 mg total) by mouth daily. 30 tablet 3  . traMADol (ULTRAM) 50 MG tablet Take 50 mg by mouth every 6 (six) hours as needed for severe pain.     No current facility-administered medications for this visit.    Allergies  Allergen Reactions  . Shellfish Allergy Nausea And Vomiting    History   Social History  . Marital Status: Single    Spouse Name: N/A    Number of Children: 0  . Years of Education: 15   Occupational History  . Unemployed     Used to work for [employer]  . Student, computer tech [employer]    Social History Main Topics  . Smoking status: Current Some Day Smoker -- 0.50 packs/day for 37 years    Types: Cigarettes  . Smokeless tobacco: Never Used  . Alcohol Use: 7.2 oz/week    12 Cans of beer per week     Comment: 08/01/2014 "8-12 beers once/ week"  . Drug Use: No  . Sexual Activity: Not Currently   Other Topics Concern  . Not on file   Social History  Narrative   Single.  Lives with a roommate.  Ambulates independently.     Review of Systems: General: negative for chills, fever, night sweats or weight changes.  Cardiovascular: negative for chest pain, dyspnea on exertion, edema, orthopnea, palpitations, paroxysmal nocturnal dyspnea or shortness of breath Dermatological: negative for rash Respiratory: negative for cough or wheezing Urologic: negative for hematuria Abdominal: negative for nausea, vomiting, diarrhea, bright red blood per rectum, melena, or hematemesis Neurologic: negative for visual changes, syncope, or dizziness All other systems reviewed and are otherwise negative except as noted above.    Blood pressure 120/70, pulse 70, height 5\' 7"  (1.702 m), weight 272 lb (123.378 kg).  General appearance: alert, cooperative and no distress Neck: no carotid bruit and no JVD Lungs: clear to auscultation bilaterally Heart: regular rate and rhythm, S1, S2 normal, no murmur, click, rub or gallop Extremities:  no LEE Pulses: 2+ and symmetric Skin: warm and dry Neurologic: Grossly normal  EKG NSR 70 bpm  ASSESSMENT AND PLAN:   1. CAD: Status post recent PCI to the proximal circumflex and OM 2 as outlined above. He denies any recurrent anginal symptoms. His EKG demonstrates normal sinus rhythm without ischemic changes. Continue dual antiplatelet therapy with aspirin plus Effient for a minimum of one year. Continue beta blocker, statin and ACE inhibitor for secondary prevention.  2. Hypertension: Blood pressure is well-controlled at 120/70. Continue lisinopril and atenolol.  3. Hyperlipidemia: This is followed by his PCP. Continue statin therapy with Lipitor.  4. Diabetes: Historically this has been poorly controlled. This is followed by PCP.  5. Tobacco abuse: Smoking cessation was strongly advised. Patient was educated on the associated risks between tobacco use and cardiovascular disease.  PLAN  Pt appears to be doing well  from a cardiac standpoint. Continue current medication regimen as outlined above. He has been instructed to follow-up with his primary cardiologist, [name], in 6 months for repeat routine evaluation.  Chloe Flis, BRITTAINYPA-C 09/11/2014 12:19 PM

## 2014-09-22 ENCOUNTER — Ambulatory Visit (INDEPENDENT_AMBULATORY_CARE_PROVIDER_SITE_OTHER): Payer: No Typology Code available for payment source | Admitting: Family Medicine

## 2014-09-22 ENCOUNTER — Encounter: Payer: Self-pay | Admitting: Family Medicine

## 2014-09-22 VITALS — BP 116/72 | HR 75 | Temp 98.1°F | Resp 20 | Wt 275.0 lb

## 2014-09-22 DIAGNOSIS — Z720 Tobacco use: Secondary | ICD-10-CM

## 2014-09-22 DIAGNOSIS — E785 Hyperlipidemia, unspecified: Secondary | ICD-10-CM

## 2014-09-22 DIAGNOSIS — Z9989 Dependence on other enabling machines and devices: Secondary | ICD-10-CM

## 2014-09-22 DIAGNOSIS — I2510 Atherosclerotic heart disease of native coronary artery without angina pectoris: Secondary | ICD-10-CM

## 2014-09-22 DIAGNOSIS — E1159 Type 2 diabetes mellitus with other circulatory complications: Secondary | ICD-10-CM

## 2014-09-22 DIAGNOSIS — M19032 Primary osteoarthritis, left wrist: Secondary | ICD-10-CM

## 2014-09-22 DIAGNOSIS — I10 Essential (primary) hypertension: Secondary | ICD-10-CM

## 2014-09-22 DIAGNOSIS — Z9861 Coronary angioplasty status: Secondary | ICD-10-CM

## 2014-09-22 DIAGNOSIS — G4733 Obstructive sleep apnea (adult) (pediatric): Secondary | ICD-10-CM

## 2014-09-22 MED ORDER — ATORVASTATIN CALCIUM 80 MG PO TABS: 80.0000 mg | ORAL_TABLET | Freq: Every day | ORAL | Status: DC

## 2014-09-22 NOTE — Progress Notes (Signed)
    Subjective:    Patient ID: [name] [name] is a 54 y.o. male presenting with Follow-up  on 09/22/2014  HPI: Reports BS is down to 94. Also, notes tylenol is not helping his wrist pain.  See note from 12/18. Notes Lipitor is too expensive, costs $30/month. [facility] needs denial letter from [facility]. Has cut down on smoking.  Review of Systems  Constitutional: Negative for fever and chills.  Respiratory: Negative for shortness of breath.   Cardiovascular: Negative for leg swelling.  Gastrointestinal: Negative for nausea, vomiting and abdominal pain.      Objective:    BP 116/72 mmHg  Pulse 75  Temp(Src) 98.1 F (36.7 C) (Oral)  Resp 20  Wt 275 lb (124.739 kg)  SpO2 95% Physical Exam  Constitutional: He appears well-developed and well-nourished. No distress.  HENT:  Head: Normocephalic and atraumatic.  Eyes: No scleral icterus.  Neck: Neck supple.  Cardiovascular: Normal rate.   Pulmonary/Chest: Effort normal.  Abdominal: Soft.  Musculoskeletal: He exhibits no edema.  Neurological: He is alert.  Skin: Skin is warm.  Psychiatric: He has a normal mood and affect.  Vitals reviewed.       Assessment & Plan:   Problem List Items Addressed This Visit      Medium   Essential hypertension (Chronic)    Well controlled    Relevant Medications      atorvastatin (LIPITOR) tablet   Hyperlipidemia with target LDL less than 70 (Chronic)    Needs refill at [facility]    Relevant Medications      atorvastatin (LIPITOR) tablet   OSA on CPAP (Chronic)    CPAP continues.    Tobacco use (Chronic)    Continue to work on cessation    DM type 2 (diabetes mellitus, type 2) - Primary    Improved control--doing well--continue    Relevant Medications      atorvastatin (LIPITOR) tablet     Unprioritized   CAD S/P percutaneous coronary angioplasty (Chronic)    Continued problems with CAD and re-stenosis    Relevant Medications      atorvastatin (LIPITOR) tablet    Other Visit  Diagnoses    Primary osteoarthritis of left wrist        Trial of aleve        Return in about 3 months (around 12/22/2014).

## 2014-09-22 NOTE — Assessment & Plan Note (Signed)
Continue to work on cessation

## 2014-09-22 NOTE — Assessment & Plan Note (Signed)
Needs refill at [facility]

## 2014-09-22 NOTE — Assessment & Plan Note (Signed)
Well controlled 

## 2014-09-22 NOTE — Assessment & Plan Note (Signed)
Continued problems with CAD and re-stenosis

## 2014-09-22 NOTE — Assessment & Plan Note (Signed)
CPAP continues.

## 2014-09-22 NOTE — Patient Instructions (Signed)
Diabetes and Exercise Exercising regularly is important. It is not just about losing weight. It has many health benefits, such as:  Improving your overall fitness, flexibility, and endurance.  Increasing your bone density.  Helping with weight control.  Decreasing your body fat.  Increasing your muscle strength.  Reducing stress and tension.  Improving your overall health. People with diabetes who exercise gain additional benefits because exercise:  Reduces appetite.  Improves the body's use of blood sugar (glucose).  Helps lower or control blood glucose.  Decreases blood pressure.  Helps control blood lipids (such as cholesterol and triglycerides).  Improves the body's use of the hormone insulin by:  Increasing the body's insulin sensitivity.  Reducing the body's insulin needs.  Decreases the risk for heart disease because exercising:  Lowers cholesterol and triglycerides levels.  Increases the levels of good cholesterol (such as high-density lipoproteins [HDL]) in the body.  Lowers blood glucose levels. YOUR ACTIVITY PLAN  Choose an activity that you enjoy and set realistic goals. Your health care provider or diabetes educator can help you make an activity plan that works for you. Exercise regularly as directed by your health care provider. This includes:  Performing resistance training twice a week such as push-ups, sit-ups, lifting weights, or using resistance bands.  Performing 150 minutes of cardio exercises each week such as walking, running, or playing sports.  Staying active and spending no more than 90 minutes at one time being inactive. Even short bursts of exercise are good for you. Three 10-minute sessions spread throughout the day are just as beneficial as a single 30-minute session. Some exercise ideas include:  Taking the dog for a walk.  Taking the stairs instead of the elevator.  Dancing to your favorite song.  Doing an exercise  video.  Doing your favorite exercise with a friend. RECOMMENDATIONS FOR EXERCISING WITH TYPE 1 OR TYPE 2 DIABETES   Check your blood glucose before exercising. If blood glucose levels are greater than 240 mg/dL, check for urine ketones. Do not exercise if ketones are present.  Avoid injecting insulin into areas of the body that are going to be exercised. For example, avoid injecting insulin into:  The arms when playing tennis.  The legs when jogging.  Keep a record of:  Food intake before and after you exercise.  Expected peak times of insulin action.  Blood glucose levels before and after you exercise.  The type and amount of exercise you have done.  Review your records with your health care provider. Your health care provider will help you to develop guidelines for adjusting food intake and insulin amounts before and after exercising.  If you take insulin or oral hypoglycemic agents, watch for signs and symptoms of hypoglycemia. They include:  Dizziness.  Shaking.  Sweating.  Chills.  Confusion.  Drink plenty of water while you exercise to prevent dehydration or heat stroke. Body water is lost during exercise and must be replaced.  Talk to your health care provider before starting an exercise program to make sure it is safe for you. Remember, almost any type of activity is better than none. Document Released: 11/19/2003 Document Revised: 01/13/2014 Document Reviewed: 02/05/2013 [facility] Patient Information 2015 [place], [place]. This information is not intended to replace advice given to you by your health care provider. Make sure you discuss any questions you have with your health care provider. Food Choices to Lower Your Triglycerides  Triglycerides are a type of fat in your blood. High levels of triglycerides can  increase the risk of heart disease and stroke. If your triglyceride levels are high, the foods you eat and your eating habits are very important. Choosing the  right foods can help lower your triglycerides.  WHAT GENERAL GUIDELINES DO I NEED TO FOLLOW?  Lose weight if you are overweight.   Limit or avoid alcohol.   Fill one half of your plate with vegetables and green salads.   Limit fruit to two servings a day. Choose fruit instead of juice.   Make one fourth of your plate whole grains. Look for the word "whole" as the first word in the ingredient list.  Fill one fourth of your plate with lean protein foods.  Enjoy fatty fish (such as salmon, mackerel, sardines, and tuna) three times a week.   Choose healthy fats.   Limit foods high in starch and sugar.  Eat more home-cooked food and less restaurant, buffet, and fast food.  Limit fried foods.  Cook foods using methods other than frying.  Limit saturated fats.  Check ingredient lists to avoid foods with partially hydrogenated oils (trans fats) in them. WHAT FOODS CAN I EAT?  Grains Whole grains, such as whole wheat or whole grain breads, crackers, cereals, and pasta. Unsweetened oatmeal, bulgur, barley, quinoa, or brown rice. Corn or whole wheat flour tortillas.  Vegetables Fresh or frozen vegetables (raw, steamed, roasted, or grilled). Green salads. Fruits All fresh, canned (in natural juice), or frozen fruits. Meat and Other Protein Products Ground beef (85% or leaner), grass-fed beef, or beef trimmed of fat. Skinless chicken or [place]. Ground chicken or [place]. Pork trimmed of fat. All fish and seafood. Eggs. Dried beans, peas, or lentils. Unsalted nuts or seeds. Unsalted canned or dry beans. Dairy Low-fat dairy products, such as skim or 1% milk, 2% or reduced-fat cheeses, low-fat ricotta or cottage cheese, or plain low-fat yogurt. Fats and Oils Tub margarines without trans fats. Light or reduced-fat mayonnaise and salad dressings. Avocado. Safflower, olive, or canola oils. Natural peanut or almond butter. The items listed above may not be a complete list of recommended  foods or beverages. Contact your dietitian for more options. WHAT FOODS ARE NOT RECOMMENDED?  Grains White bread. White pasta. White rice. Cornbread. Bagels, pastries, and croissants. Crackers that contain trans fat. Vegetables White potatoes. Corn. Creamed or fried vegetables. Vegetables in a cheese sauce. Fruits Dried fruits. Canned fruit in light or heavy syrup. Fruit juice. Meat and Other Protein Products Fatty cuts of meat. Ribs, chicken wings, bacon, sausage, bologna, salami, chitterlings, fatback, hot dogs, bratwurst, and packaged luncheon meats. Dairy Whole or 2% milk, cream, half-and-half, and cream cheese. Whole-fat or sweetened yogurt. Full-fat cheeses. Nondairy creamers and whipped toppings. Processed cheese, cheese spreads, or cheese curds. Sweets and Desserts Corn syrup, sugars, honey, and molasses. Candy. Jam and jelly. Syrup. Sweetened cereals. Cookies, pies, cakes, donuts, muffins, and ice cream. Fats and Oils Butter, stick margarine, lard, shortening, ghee, or bacon fat. Coconut, palm kernel, or palm oils. Beverages Alcohol. Sweetened drinks (such as sodas, lemonade, and fruit drinks or punches). The items listed above may not be a complete list of foods and beverages to avoid. Contact your dietitian for more information. Document Released: 06/16/2004 Document Revised: 09/03/2013 Document Reviewed: 07/03/2013 [facility] Patient Information 2015 [place], [place]. This information is not intended to replace advice given to you by your health care provider. Make sure you discuss any questions you have with your health care provider. Smoking Cessation Quitting smoking is important to your health and has  many advantages. However, it is not always easy to quit since nicotine is a very addictive drug. Oftentimes, people try 3 times or more before being able to quit. This document explains the best ways for you to prepare to quit smoking. Quitting takes hard work and a lot of effort, but  you can do it. ADVANTAGES OF QUITTING SMOKING  You will live longer, feel better, and live better.  Your body will feel the impact of quitting smoking almost immediately.  Within 20 minutes, blood pressure decreases. Your pulse returns to its normal level.  After 8 hours, carbon monoxide levels in the blood return to normal. Your oxygen level increases.  After 24 hours, the chance of having a heart attack starts to decrease. Your breath, hair, and body stop smelling like smoke.  After 48 hours, damaged nerve endings begin to recover. Your sense of taste and smell improve.  After 72 hours, the body is virtually free of nicotine. Your bronchial tubes relax and breathing becomes easier.  After 2 to 12 weeks, lungs can hold more air. Exercise becomes easier and circulation improves.  The risk of having a heart attack, stroke, cancer, or lung disease is greatly reduced.  After 1 year, the risk of coronary heart disease is cut in half.  After 5 years, the risk of stroke falls to the same as a nonsmoker.  After 10 years, the risk of lung cancer is cut in half and the risk of other cancers decreases significantly.  After 15 years, the risk of coronary heart disease drops, usually to the level of a nonsmoker.  If you are pregnant, quitting smoking will improve your chances of having a healthy baby.  The people you live with, especially any children, will be healthier.  You will have extra money to spend on things other than cigarettes. QUESTIONS TO THINK ABOUT BEFORE ATTEMPTING TO QUIT You may want to talk about your answers with your health care provider.  Why do you want to quit?  If you tried to quit in the past, what helped and what did not?  What will be the most difficult situations for you after you quit? How will you plan to handle them?  Who can help you through the tough times? Your family? Friends? A health care provider?  What pleasures do you get from smoking? What  ways can you still get pleasure if you quit? Here are some questions to ask your health care provider:  How can you help me to be successful at quitting?  What medicine do you think would be best for me and how should I take it?  What should I do if I need more help?  What is smoking withdrawal like? How can I get information on withdrawal? GET READY  Set a quit date.  Change your environment by getting rid of all cigarettes, ashtrays, matches, and lighters in your home, car, or work. Do not let people smoke in your home.  Review your past attempts to quit. Think about what worked and what did not. GET SUPPORT AND ENCOURAGEMENT You have a better chance of being successful if you have help. You can get support in many ways.  Tell your family, friends, and coworkers that you are going to quit and need their support. Ask them not to smoke around you.  Get individual, group, or telephone counseling and support. Programs are available at [employer] and health centers. Call your local health department for information about programs in your  area.  Spiritual beliefs and practices may help some smokers quit.  Download a "quit meter" on your computer to keep track of quit statistics, such as how long you have gone without smoking, cigarettes not smoked, and money saved.  Get a self-help book about quitting smoking and staying off tobacco. [place] yourself from urges to smoke. Talk to someone, go for a walk, or occupy your time with a task.  Change your normal routine. Take a different route to work. Drink tea instead of coffee. Eat breakfast in a different place.  Reduce your stress. Take a hot bath, exercise, or read a book.  Plan something enjoyable to do every day. Reward yourself for not smoking.  Explore interactive web-based programs that specialize in helping you quit. GET MEDICINE AND USE IT CORRECTLY Medicines can help you stop smoking  and decrease the urge to smoke. Combining medicine with the above behavioral methods and support can greatly increase your chances of successfully quitting smoking.  Nicotine replacement therapy helps deliver nicotine to your body without the negative effects and risks of smoking. Nicotine replacement therapy includes nicotine gum, lozenges, inhalers, nasal sprays, and skin patches. Some may be available over-the-counter and others require a prescription.  Antidepressant medicine helps people abstain from smoking, but how this works is unknown. This medicine is available by prescription.  Nicotinic receptor partial agonist medicine simulates the effect of nicotine in your brain. This medicine is available by prescription. Ask your health care provider for advice about which medicines to use and how to use them based on your health history. Your health care provider will tell you what side effects to look out for if you choose to be on a medicine or therapy. Carefully read the information on the package. Do not use any other product containing nicotine while using a nicotine replacement product.  RELAPSE OR DIFFICULT SITUATIONS Most relapses occur within the first 3 months after quitting. Do not be discouraged if you start smoking again. Remember, most people try several times before finally quitting. You may have symptoms of withdrawal because your body is used to nicotine. You may crave cigarettes, be irritable, feel very hungry, cough often, get headaches, or have difficulty concentrating. The withdrawal symptoms are only temporary. They are strongest when you first quit, but they will go away within 10-14 days. To reduce the chances of relapse, try to:  Avoid drinking alcohol. Drinking lowers your chances of successfully quitting.  Reduce the amount of caffeine you consume. Once you quit smoking, the amount of caffeine in your body increases and can give you symptoms, such as a rapid heartbeat,  sweating, and anxiety.  Avoid smokers because they can make you want to smoke.  Do not let weight gain distract you. Many smokers will gain weight when they quit, usually less than 10 pounds. Eat a healthy diet and stay active. You can always lose the weight gained after you quit.  Find ways to improve your mood other than smoking. FOR MORE INFORMATION  www.smokefree.gov  Document Released: 08/23/2001 Document Revised: 01/13/2014 Document Reviewed: 12/08/2011 [facility] Patient Information 2015 [place], [place]. This information is not intended to replace advice given to you by your health care provider. Make sure you discuss any questions you have with your health care provider.

## 2014-09-22 NOTE — Assessment & Plan Note (Signed)
Improved control--doing well--continue

## 2014-09-25 ENCOUNTER — Encounter (HOSPITAL_COMMUNITY): Admission: RE | Admit: 2014-09-25 | Payer: MEDICAID | Source: Ambulatory Visit

## 2014-10-01 ENCOUNTER — Ambulatory Visit (HOSPITAL_COMMUNITY): Payer: No Typology Code available for payment source

## 2014-10-06 ENCOUNTER — Ambulatory Visit (INDEPENDENT_AMBULATORY_CARE_PROVIDER_SITE_OTHER): Payer: No Typology Code available for payment source | Admitting: Family Medicine

## 2014-10-06 ENCOUNTER — Encounter: Payer: Self-pay | Admitting: Family Medicine

## 2014-10-06 ENCOUNTER — Ambulatory Visit (HOSPITAL_COMMUNITY): Payer: No Typology Code available for payment source

## 2014-10-06 VITALS — BP 144/88 | HR 88 | Temp 98.3°F | Resp 18 | Wt 269.0 lb

## 2014-10-06 DIAGNOSIS — R0602 Shortness of breath: Secondary | ICD-10-CM

## 2014-10-06 DIAGNOSIS — Z720 Tobacco use: Secondary | ICD-10-CM

## 2014-10-06 DIAGNOSIS — I10 Essential (primary) hypertension: Secondary | ICD-10-CM

## 2014-10-06 DIAGNOSIS — M25532 Pain in left wrist: Secondary | ICD-10-CM

## 2014-10-06 NOTE — Patient Instructions (Signed)
Smoking Cessation Quitting smoking is important to your health and has many advantages. However, it is not always easy to quit since nicotine is a very addictive drug. Oftentimes, people try 3 times or more before being able to quit. This document explains the best ways for you to prepare to quit smoking. Quitting takes hard work and a lot of effort, but you can do it. ADVANTAGES OF QUITTING SMOKING  You will live longer, feel better, and live better.  Your body will feel the impact of quitting smoking almost immediately.  Within 20 minutes, blood pressure decreases. Your pulse returns to its normal level.  After 8 hours, carbon monoxide levels in the blood return to normal. Your oxygen level increases.  After 24 hours, the chance of having a heart attack starts to decrease. Your breath, hair, and body stop smelling like smoke.  After 48 hours, damaged nerve endings begin to recover. Your sense of taste and smell improve.  After 72 hours, the body is virtually free of nicotine. Your bronchial tubes relax and breathing becomes easier.  After 2 to 12 weeks, lungs can hold more air. Exercise becomes easier and circulation improves.  The risk of having a heart attack, stroke, cancer, or lung disease is greatly reduced.  After 1 year, the risk of coronary heart disease is cut in half.  After 5 years, the risk of stroke falls to the same as a nonsmoker.  After 10 years, the risk of lung cancer is cut in half and the risk of other cancers decreases significantly.  After 15 years, the risk of coronary heart disease drops, usually to the level of a nonsmoker.  If you are pregnant, quitting smoking will improve your chances of having a healthy baby.  The people you live with, especially any children, will be healthier.  You will have extra money to spend on things other than cigarettes. QUESTIONS TO THINK ABOUT BEFORE ATTEMPTING TO QUIT You may want to talk about your answers with your  health care provider.  Why do you want to quit?  If you tried to quit in the past, what helped and what did not?  What will be the most difficult situations for you after you quit? How will you plan to handle them?  Who can help you through the tough times? Your family? Friends? A health care provider?  What pleasures do you get from smoking? What ways can you still get pleasure if you quit? Here are some questions to ask your health care provider:  How can you help me to be successful at quitting?  What medicine do you think would be best for me and how should I take it?  What should I do if I need more help?  What is smoking withdrawal like? How can I get information on withdrawal? GET READY  Set a quit date.  Change your environment by getting rid of all cigarettes, ashtrays, matches, and lighters in your home, car, or work. Do not let people smoke in your home.  Review your past attempts to quit. Think about what worked and what did not. GET SUPPORT AND ENCOURAGEMENT You have a better chance of being successful if you have help. You can get support in many ways.  Tell your family, friends, and coworkers that you are going to quit and need their support. Ask them not to smoke around you.  Get individual, group, or telephone counseling and support. Programs are available at local hospitals and health centers. Call   your local health department for information about programs in your area.  Spiritual beliefs and practices may help some smokers quit.  Download a "quit meter" on your computer to keep track of quit statistics, such as how long you have gone without smoking, cigarettes not smoked, and money saved.  Get a self-help book about quitting smoking and staying off tobacco. LEARN NEW SKILLS AND BEHAVIORS  Distract yourself from urges to smoke. Talk to someone, go for a walk, or occupy your time with a task.  Change your normal routine. Take a different route to work.  Drink tea instead of coffee. Eat breakfast in a different place.  Reduce your stress. Take a hot bath, exercise, or read a book.  Plan something enjoyable to do every day. Reward yourself for not smoking.  Explore interactive web-based programs that specialize in helping you quit. GET MEDICINE AND USE IT CORRECTLY Medicines can help you stop smoking and decrease the urge to smoke. Combining medicine with the above behavioral methods and support can greatly increase your chances of successfully quitting smoking.  Nicotine replacement therapy helps deliver nicotine to your body without the negative effects and risks of smoking. Nicotine replacement therapy includes nicotine gum, lozenges, inhalers, nasal sprays, and skin patches. Some may be available over-the-counter and others require a prescription.  Antidepressant medicine helps people abstain from smoking, but how this works is unknown. This medicine is available by prescription.  Nicotinic receptor partial agonist medicine simulates the effect of nicotine in your brain. This medicine is available by prescription. Ask your health care provider for advice about which medicines to use and how to use them based on your health history. Your health care provider will tell you what side effects to look out for if you choose to be on a medicine or therapy. Carefully read the information on the package. Do not use any other product containing nicotine while using a nicotine replacement product.  RELAPSE OR DIFFICULT SITUATIONS Most relapses occur within the first 3 months after quitting. Do not be discouraged if you start smoking again. Remember, most people try several times before finally quitting. You may have symptoms of withdrawal because your body is used to nicotine. You may crave cigarettes, be irritable, feel very hungry, cough often, get headaches, or have difficulty concentrating. The withdrawal symptoms are only temporary. They are strongest  when you first quit, but they will go away within 10-14 days. To reduce the chances of relapse, try to:  Avoid drinking alcohol. Drinking lowers your chances of successfully quitting.  Reduce the amount of caffeine you consume. Once you quit smoking, the amount of caffeine in your body increases and can give you symptoms, such as a rapid heartbeat, sweating, and anxiety.  Avoid smokers because they can make you want to smoke.  Do not let weight gain distract you. Many smokers will gain weight when they quit, usually less than 10 pounds. Eat a healthy diet and stay active. You can always lose the weight gained after you quit.  Find ways to improve your mood other than smoking. FOR MORE INFORMATION  www.smokefree.gov  Document Released: 08/23/2001 Document Revised: 01/13/2014 Document Reviewed: 12/08/2011 [employer] Patient Information 2015 [employer]. This information is not intended to replace advice given to you by your health care provider. Make sure you discuss any questions you have with your health care provider.  

## 2014-10-06 NOTE — Progress Notes (Signed)
    Subjective:    Patient ID: [name] [name] is a 54 y.o. male presenting with Follow-up  on 10/06/2014  HPI: Returns today requesting a different note for disability.  This one needs to describe his functional disability. He has cut down on smoking and is using sugar free halls lozenges to keep from smoking. Continue to have wrist pain with any small movement, despite NSAIDS trial from last visit.  Review of Systems  Constitutional: Negative for fever and chills.  Respiratory: Negative for shortness of breath.   Cardiovascular: Negative for leg swelling.  Gastrointestinal: Negative for nausea, vomiting and abdominal pain.      Objective:    BP 144/88 mmHg  Pulse 88  Temp(Src) 98.3 F (36.8 C) (Oral)  Resp 18  Wt 269 lb (122.018 kg)  SpO2 95% Physical Exam  Constitutional: He appears well-developed and well-nourished. No distress.  HENT:  Head: Normocephalic and atraumatic.  Eyes: No scleral icterus.  Neck: Neck supple.  Cardiovascular: Normal rate.   Pulmonary/Chest: Effort normal.  Abdominal: Soft.  Musculoskeletal: He exhibits no edema.  Neurological: He is alert.  Skin: Skin is warm.  Psychiatric: He has a normal mood and affect.  Vitals reviewed.       Assessment & Plan:   Problem List Items Addressed This Visit      Medium   Essential hypertension (Chronic)    Well controlled      Tobacco use - Primary (Chronic)    Encouraged pt. To continue efforts to quit smoking.        Unprioritized   SOB (shortness of breath) on exertion    Other Visit Diagnoses    Left wrist pain        likely some arthritis or bursitis--continue NSAIDS for pain.        Return in about 3 months (around 01/05/2015).

## 2014-10-06 NOTE — Assessment & Plan Note (Signed)
Well controlled 

## 2014-10-06 NOTE — Assessment & Plan Note (Signed)
Encouraged pt. To continue efforts to quit smoking.

## 2014-10-08 ENCOUNTER — Ambulatory Visit (HOSPITAL_COMMUNITY): Payer: No Typology Code available for payment source

## 2014-10-10 ENCOUNTER — Telehealth: Payer: Self-pay | Admitting: *Deleted

## 2014-10-10 DIAGNOSIS — I214 Non-ST elevation (NSTEMI) myocardial infarction: Secondary | ICD-10-CM

## 2014-10-10 DIAGNOSIS — E785 Hyperlipidemia, unspecified: Secondary | ICD-10-CM

## 2014-10-10 NOTE — Telephone Encounter (Signed)
Received a fax from [facility] stating they did not receive a Rx for Atorvastatin 40 mg, #30: 1 tab daily.  A Rx was sent to [facility] 09/22/2014 by PCP for Atorvastatin 80 mg #30, 11 refills, 1 tab daily.  Left voice mail on health department pharmacy line with current Rx for Atorvastatin 80 mg.  Derl Barrow, RN

## 2014-10-13 ENCOUNTER — Ambulatory Visit (HOSPITAL_COMMUNITY): Payer: No Typology Code available for payment source

## 2014-10-14 ENCOUNTER — Ambulatory Visit (INDEPENDENT_AMBULATORY_CARE_PROVIDER_SITE_OTHER): Payer: No Typology Code available for payment source | Admitting: Pharmacist

## 2014-10-14 ENCOUNTER — Other Ambulatory Visit: Payer: Self-pay | Admitting: Family Medicine

## 2014-10-14 ENCOUNTER — Encounter: Payer: Self-pay | Admitting: Pharmacist

## 2014-10-14 VITALS — BP 128/77 | HR 85 | Ht 67.0 in | Wt 269.6 lb

## 2014-10-14 DIAGNOSIS — Z720 Tobacco use: Secondary | ICD-10-CM

## 2014-10-14 DIAGNOSIS — E785 Hyperlipidemia, unspecified: Secondary | ICD-10-CM

## 2014-10-14 DIAGNOSIS — E119 Type 2 diabetes mellitus without complications: Secondary | ICD-10-CM

## 2014-10-14 LAB — POCT GLYCOSYLATED HEMOGLOBIN (HGB A1C): HEMOGLOBIN A1C: 7.7

## 2014-10-14 MED ORDER — ATORVASTATIN CALCIUM 80 MG PO TABS: 80.0000 mg | ORAL_TABLET | Freq: Every day | ORAL | Status: DC

## 2014-10-14 NOTE — Patient Instructions (Signed)
It was great to see you today!  Continue taking Lantus 35 units twice a day, and Novolog 25 units only when you eat a meal. If you skip a meal, skip the Novolog.  Continue taking Victoza 1.8 mg daily.  Continue taking the atorvastatin 80 mg daily but if you have a hard time affording it, let us know.  Schedule your next visit with Dr. Kennon Rounds.

## 2014-10-14 NOTE — Assessment & Plan Note (Addendum)
Longstanding diabetes currently under improved control with A1c of 7.7 (down from 8.5 in October 2015) .   Denies hypoglycemic events and is able to verbalize appropriate hypoglycemia management plan.  Reports adherence with medication. Control is suboptimal due to dietary indiscretion. Continued basal insulin Lantus (insulin glargine) 35 units BID. Continued rapid insulin Novolog (insulin aspart) 25 units TID with meals. Continued Victoza (liraglutide) 1.8 mg daily. Reinforced to skip the Novolog when he does not eat a meal. Encouraged patient to exercise when able and to eat less carbs (biscuits and apple pie).

## 2014-10-14 NOTE — Progress Notes (Signed)
S:    Patient arrives in good spirits. Presents for diabetes follow-up.   Patient denies adherence with medications but sometimes will skip a third dose of Novolog if he skips a meal. Current diabetes medications include Lantus 35 units BID, Novolog 25 units TID with meals, and Victoza 1.8 mg daily.   Patient denies hypoglycemic events.  Patient reported dietary habits: drinks water and diet soda, and sometimes low-fat milk. Reports eating peanut butter sandwiches for breakfast, and then school lunches (meat, rices, rolls/biscuits, salads, and apple pie) for lunch. He has no control over the food given to him for lunch at school. He often skips dinner but will have [employer] or McDonalds for dinner sometimes because it is given to him. He does not cook at home.   Patient reported exercise habits: walks but can't do any physical activity due to left wrist pain (arthritis). He is waiting on a referral for physical therapy.   Patient reports nocturia 1-2 times nightly. Patient reports neuropathy occasionally but reports that most of his pain is in his wrist. Patient denies visual changes.   Patient reports that he was recently denied disability. Patient also reports that MAP no longer covers atorvastatin 80 mg. He reports that costs $30 per month.    O:    Lab Results  Component Value Date   HGBA1C 7.7 10/14/2014     Home CBG: 100s-206. Patient did not have meter with him.   A/P: Longstanding diabetes currently under improved control with A1c of 7.7 (down from 8.5 in October 2015) .   Denies hypoglycemic events and is able to verbalize appropriate hypoglycemia management plan.  Reports adherence with medication. Control is suboptimal due to dietary indiscretion. Continued basal insulin Lantus (insulin glargine) 35 units BID. Continued rapid insulin Novolog (insulin aspart) 25 units TID with meals. Continued Victoza (liraglutide) 1.8 mg daily. Reinforced to skip the Novolog when he does not  eat a meal. Encouraged patient to exercise when able and to eat less carbs (biscuits and apple pie).   Hyperlipidemia currently controlled with atorvastatin 80 mg daily. Patient reports that MAP no longer covers atorvastatin but reports that for the next 3 months he can afford the $30 copay. Patient will continue atorvastatin 80 mg at this time. Paper script given to patient for atorvastatin 80 mg for him to be able to fill when he has money.   Next A1C anticipated in May 2016.  Written patient instructions provided.  Follow up with Dr. Kennon Rounds next. Total time in face to face counseling 30 minutes.  Patient seen with Nicoletta Ba, PharmD Resident.

## 2014-10-14 NOTE — Assessment & Plan Note (Signed)
Chronic tobacco abuse reports complete abstinence for 9 days.   Encouraged continued abstinence.   Reevaluate status at next visit.

## 2014-10-14 NOTE — Telephone Encounter (Signed)
Pt was in clinic today for appt with Dr. Valentina Lucks.  He states that HD does not have lipitor.  Can something else be called in? Clinton Sawyer, Salome Spotted

## 2014-10-14 NOTE — Assessment & Plan Note (Addendum)
Hyperlipidemia currently controlled with atorvastatin 80 mg daily. Patient reports that MAP no longer covers atorvastatin but reports that for the next 3 months he can afford the $30 copay. Patient will continue atorvastatin 80 mg at this time. Paper script given to patient for atorvastatin 80 mg for him to be able to fill when he has money.   Consider option of switching to rosuvastatin (Crestor) if patient required use of MAP for cholesterol tx at next visit.

## 2014-10-14 NOTE — Progress Notes (Signed)
Patient ID: [name] [name], male   DOB: October 22, 1960, 54 y.o.   MRN: [id number] Reviewed: Agree with Dr. Graylin Shiver documentation and management.

## 2014-10-15 ENCOUNTER — Ambulatory Visit (HOSPITAL_COMMUNITY): Payer: No Typology Code available for payment source

## 2014-10-15 NOTE — Telephone Encounter (Signed)
Spoke with Dawn @ [facility] program.  The only med they have that is comparable to lipitor 80mg  would be crestor 40mg .  Will forward to MD Pricilla Moehle, Salome Spotted

## 2014-10-17 NOTE — Telephone Encounter (Signed)
Given his heart condition (3 heart attacks in last 2.5 years with 3 stents), he really should be on this medication-Lipitor---any other options open to him?

## 2014-10-17 NOTE — Telephone Encounter (Signed)
Appears that Atorvastatin is no longer a MAP drug - likely needs to be switched to Crestor (rosuvastatin) through MAP.     He was willing to pay the $30 for atorvastatin for the month.   Should we encourage him to switch to Crestor and do the paperwork at MAP now?   I think this may be the best option.

## 2014-10-19 MED ORDER — ROSUVASTATIN CALCIUM 40 MG PO TABS: 40.0000 mg | ORAL_TABLET | Freq: Every day | ORAL | Status: DC

## 2014-10-19 NOTE — Addendum Note (Signed)
Addended by: [name] on: 10/19/2014 10:36 AM   Modules accepted: Orders

## 2014-10-20 ENCOUNTER — Ambulatory Visit (HOSPITAL_COMMUNITY): Payer: Self-pay

## 2014-10-20 NOTE — Telephone Encounter (Signed)
Unable to reach patient by phone.  No option to leave a message. Asal Teas,CMA

## 2014-10-20 NOTE — Telephone Encounter (Signed)
LM for [facility] with rx. Johnney Ou

## 2014-10-21 ENCOUNTER — Other Ambulatory Visit: Payer: Self-pay | Admitting: *Deleted

## 2014-10-21 ENCOUNTER — Telehealth: Payer: Self-pay | Admitting: Cardiology

## 2014-10-21 DIAGNOSIS — E1159 Type 2 diabetes mellitus with other circulatory complications: Secondary | ICD-10-CM

## 2014-10-21 MED ORDER — INSULIN GLARGINE 100 UNIT/ML ~~LOC~~ SOLN: 35.0000 [IU] | Freq: Two times a day (BID) | SUBCUTANEOUS | Status: DC

## 2014-10-21 NOTE — Telephone Encounter (Signed)
Returned call to patient no answer.Left message on personal voice mail samples of effient 10 mg left at front desk of [facility] office.

## 2014-10-21 NOTE — Telephone Encounter (Signed)
Mr. Livermore is calling because he is asking for some samples of Effient . Please call   Thanks

## 2014-10-22 ENCOUNTER — Ambulatory Visit (HOSPITAL_COMMUNITY): Payer: Self-pay

## 2014-10-27 ENCOUNTER — Ambulatory Visit (HOSPITAL_COMMUNITY): Payer: Self-pay

## 2014-10-29 ENCOUNTER — Ambulatory Visit (HOSPITAL_COMMUNITY): Payer: Self-pay

## 2014-10-30 MED ORDER — INSULIN GLARGINE 100 UNIT/ML ~~LOC~~ SOLN: 35.0000 [IU] | Freq: Two times a day (BID) | SUBCUTANEOUS | Status: DC

## 2014-10-30 NOTE — Addendum Note (Signed)
Addended by: Derl Barrow on: 10/30/2014 02:59 PM   Modules accepted: Orders

## 2014-10-30 NOTE — Telephone Encounter (Signed)
Medication was approved from Dr. Kennon Rounds on 10/21/2014.  Will resend refill again to MAP.  Derl Barrow, RN

## 2014-11-03 ENCOUNTER — Ambulatory Visit (HOSPITAL_COMMUNITY): Payer: Self-pay

## 2014-11-05 ENCOUNTER — Ambulatory Visit (HOSPITAL_COMMUNITY): Payer: Self-pay

## 2014-11-06 ENCOUNTER — Telehealth: Payer: Self-pay

## 2014-11-06 NOTE — Telephone Encounter (Signed)
LMTCO ABOUT HIS EFFEINT HERE AT THE OFFICE

## 2014-11-10 ENCOUNTER — Ambulatory Visit (HOSPITAL_COMMUNITY): Payer: Self-pay

## 2014-11-12 ENCOUNTER — Ambulatory Visit (HOSPITAL_COMMUNITY): Payer: Self-pay

## 2014-11-12 ENCOUNTER — Telehealth: Payer: Self-pay | Admitting: Family Medicine

## 2014-11-12 NOTE — Telephone Encounter (Signed)
LM for patient to call back.  Asked him to leave more information regarding his medication.  Fax will be taken care when we get it.  Thanks [employer]

## 2014-11-12 NOTE — Telephone Encounter (Signed)
Pt called because he said that the MAP program is faxing the doctor some information about his medication no longer being in 25 qty. He would like to talk to Janett Billow so that he can explain this better. Please call him after 11 am today since he is in school until then. Blima Rich

## 2014-11-13 MED ORDER — NITROGLYCERIN 0.4 MG SL SUBL: 0.4000 mg | SUBLINGUAL_TABLET | SUBLINGUAL | Status: DC | PRN

## 2014-11-13 NOTE — Telephone Encounter (Signed)
Spoke with patient and per the MAP program they no longer dispense Nitro in a quantity of 25 tablets only 100 tablets now.  They have faxed [place] a form that needs to be signed and faxed back to them.  Would like to know if this can be done by Monday so he doesn't run out.  Will send message to RN to see if form was received.  Jazmin Hartsell,CMA

## 2014-11-13 NOTE — Telephone Encounter (Signed)
Please provide me with a verbal order to change quantity of the Nitro to 100 tablets from 25 tablet.  Request is coming from the [place].  Derl Barrow, RN

## 2014-11-13 NOTE — Telephone Encounter (Signed)
Medication called into [facility]; #100 nitro tab with 3 refills per Dr. Kennon Rounds.  Derl Barrow, RN

## 2014-11-14 ENCOUNTER — Telehealth: Payer: Self-pay

## 2014-11-14 NOTE — Telephone Encounter (Signed)
Called patient to let him know that i placed his samples at the front desk

## 2014-11-17 ENCOUNTER — Ambulatory Visit (HOSPITAL_COMMUNITY): Payer: Self-pay

## 2014-11-19 ENCOUNTER — Ambulatory Visit (HOSPITAL_COMMUNITY): Payer: Self-pay

## 2014-11-22 ENCOUNTER — Encounter (HOSPITAL_COMMUNITY): Payer: Self-pay | Admitting: Emergency Medicine

## 2014-11-22 ENCOUNTER — Emergency Department (HOSPITAL_COMMUNITY): Payer: Medicaid Other

## 2014-11-22 ENCOUNTER — Inpatient Hospital Stay (HOSPITAL_COMMUNITY)
Admission: EM | Admit: 2014-11-22 | Discharge: 2014-11-24 | DRG: 190 | Disposition: A | Discharge status: Home / Self Care | Payer: Medicaid Other | Attending: Internal Medicine | Admitting: Internal Medicine

## 2014-11-22 DIAGNOSIS — Z792 Long term (current) use of antibiotics: Secondary | ICD-10-CM | POA: Diagnosis not present

## 2014-11-22 DIAGNOSIS — R059 Cough, unspecified: Secondary | ICD-10-CM

## 2014-11-22 DIAGNOSIS — M549 Dorsalgia, unspecified: Secondary | ICD-10-CM | POA: Diagnosis present

## 2014-11-22 DIAGNOSIS — E1165 Type 2 diabetes mellitus with hyperglycemia: Secondary | ICD-10-CM | POA: Diagnosis present

## 2014-11-22 DIAGNOSIS — Z7952 Long term (current) use of systemic steroids: Secondary | ICD-10-CM | POA: Diagnosis not present

## 2014-11-22 DIAGNOSIS — G4733 Obstructive sleep apnea (adult) (pediatric): Secondary | ICD-10-CM | POA: Diagnosis present

## 2014-11-22 DIAGNOSIS — Z794 Long term (current) use of insulin: Secondary | ICD-10-CM | POA: Diagnosis not present

## 2014-11-22 DIAGNOSIS — J069 Acute upper respiratory infection, unspecified: Secondary | ICD-10-CM | POA: Diagnosis present

## 2014-11-22 DIAGNOSIS — Z8249 Family history of ischemic heart disease and other diseases of the circulatory system: Secondary | ICD-10-CM | POA: Diagnosis not present

## 2014-11-22 DIAGNOSIS — I10 Essential (primary) hypertension: Secondary | ICD-10-CM | POA: Diagnosis present

## 2014-11-22 DIAGNOSIS — Z9861 Coronary angioplasty status: Secondary | ICD-10-CM | POA: Diagnosis not present

## 2014-11-22 DIAGNOSIS — E119 Type 2 diabetes mellitus without complications: Secondary | ICD-10-CM

## 2014-11-22 DIAGNOSIS — F17200 Nicotine dependence, unspecified, uncomplicated: Secondary | ICD-10-CM | POA: Diagnosis present

## 2014-11-22 DIAGNOSIS — I2510 Atherosclerotic heart disease of native coronary artery without angina pectoris: Secondary | ICD-10-CM | POA: Diagnosis present

## 2014-11-22 DIAGNOSIS — F17210 Nicotine dependence, cigarettes, uncomplicated: Secondary | ICD-10-CM | POA: Diagnosis present

## 2014-11-22 DIAGNOSIS — D72829 Elevated white blood cell count, unspecified: Secondary | ICD-10-CM | POA: Diagnosis present

## 2014-11-22 DIAGNOSIS — Z87891 Personal history of nicotine dependence: Secondary | ICD-10-CM | POA: Diagnosis present

## 2014-11-22 DIAGNOSIS — Z7982 Long term (current) use of aspirin: Secondary | ICD-10-CM

## 2014-11-22 DIAGNOSIS — I252 Old myocardial infarction: Secondary | ICD-10-CM | POA: Diagnosis not present

## 2014-11-22 DIAGNOSIS — Z823 Family history of stroke: Secondary | ICD-10-CM

## 2014-11-22 DIAGNOSIS — J441 Chronic obstructive pulmonary disease with (acute) exacerbation: Principal | ICD-10-CM | POA: Diagnosis present

## 2014-11-22 DIAGNOSIS — F329 Major depressive disorder, single episode, unspecified: Secondary | ICD-10-CM | POA: Diagnosis present

## 2014-11-22 DIAGNOSIS — J449 Chronic obstructive pulmonary disease, unspecified: Secondary | ICD-10-CM | POA: Diagnosis present

## 2014-11-22 DIAGNOSIS — Z91013 Allergy to seafood: Secondary | ICD-10-CM

## 2014-11-22 DIAGNOSIS — Z79891 Long term (current) use of opiate analgesic: Secondary | ICD-10-CM | POA: Diagnosis not present

## 2014-11-22 DIAGNOSIS — K219 Gastro-esophageal reflux disease without esophagitis: Secondary | ICD-10-CM | POA: Diagnosis present

## 2014-11-22 DIAGNOSIS — G8929 Other chronic pain: Secondary | ICD-10-CM | POA: Diagnosis present

## 2014-11-22 DIAGNOSIS — J9601 Acute respiratory failure with hypoxia: Secondary | ICD-10-CM | POA: Diagnosis present

## 2014-11-22 DIAGNOSIS — Z79899 Other long term (current) drug therapy: Secondary | ICD-10-CM | POA: Diagnosis not present

## 2014-11-22 DIAGNOSIS — E785 Hyperlipidemia, unspecified: Secondary | ICD-10-CM | POA: Diagnosis present

## 2014-11-22 DIAGNOSIS — Z720 Tobacco use: Secondary | ICD-10-CM

## 2014-11-22 DIAGNOSIS — R05 Cough: Secondary | ICD-10-CM | POA: Insufficient documentation

## 2014-11-22 DIAGNOSIS — E118 Type 2 diabetes mellitus with unspecified complications: Secondary | ICD-10-CM

## 2014-11-22 DIAGNOSIS — R0602 Shortness of breath: Secondary | ICD-10-CM | POA: Diagnosis present

## 2014-11-22 DIAGNOSIS — Z9989 Dependence on other enabling machines and devices: Secondary | ICD-10-CM

## 2014-11-22 DIAGNOSIS — M1990 Unspecified osteoarthritis, unspecified site: Secondary | ICD-10-CM | POA: Diagnosis present

## 2014-11-22 DIAGNOSIS — Z803 Family history of malignant neoplasm of breast: Secondary | ICD-10-CM

## 2014-11-22 LAB — CBC WITH DIFFERENTIAL/PLATELET
BASOS ABS: 0.1 10*3/uL (ref 0.0–0.1)
BASOS PCT: 1 % (ref 0–1)
Eosinophils Absolute: 0.6 10*3/uL (ref 0.0–0.7)
Eosinophils Relative: 5 % (ref 0–5)
HEMATOCRIT: 46.3 % (ref 39.0–52.0)
HEMOGLOBIN: 16.5 g/dL (ref 13.0–17.0)
Lymphocytes Relative: 25 % (ref 12–46)
Lymphs Abs: 2.6 10*3/uL (ref 0.7–4.0)
MCH: 31.7 pg (ref 26.0–34.0)
MCHC: 35.6 g/dL (ref 30.0–36.0)
MCV: 88.9 fL (ref 78.0–100.0)
MONOS PCT: 8 % (ref 3–12)
Monocytes Absolute: 0.8 10*3/uL (ref 0.1–1.0)
Neutro Abs: 6.2 10*3/uL (ref 1.7–7.7)
Neutrophils Relative %: 61 % (ref 43–77)
PLATELETS: 279 10*3/uL (ref 150–400)
RBC: 5.21 MIL/uL (ref 4.22–5.81)
RDW: 12.3 % (ref 11.5–15.5)
WBC: 10.3 10*3/uL (ref 4.0–10.5)

## 2014-11-22 LAB — GLUCOSE, CAPILLARY
Glucose-Capillary: 487 mg/dL — ABNORMAL HIGH (ref 70–99)
Glucose-Capillary: 352 mg/dL — ABNORMAL HIGH (ref 70–99)

## 2014-11-22 LAB — COMPREHENSIVE METABOLIC PANEL
ALT: 31 U/L (ref 0–53)
ANION GAP: 7 (ref 5–15)
AST: 32 U/L (ref 0–37)
Albumin: 3.4 g/dL — ABNORMAL LOW (ref 3.5–5.2)
Alkaline Phosphatase: 86 U/L (ref 39–117)
BUN: 10 mg/dL (ref 6–23)
CALCIUM: 8.3 mg/dL — AB (ref 8.4–10.5)
CO2: 24 mmol/L (ref 19–32)
Chloride: 102 mmol/L (ref 96–112)
Creatinine, Ser: 0.81 mg/dL (ref 0.50–1.35)
Glucose, Bld: 248 mg/dL — ABNORMAL HIGH (ref 70–99)
Potassium: 4.5 mmol/L (ref 3.5–5.1)
SODIUM: 133 mmol/L — AB (ref 135–145)
TOTAL PROTEIN: 6.6 g/dL (ref 6.0–8.3)
Total Bilirubin: 0.9 mg/dL (ref 0.3–1.2)

## 2014-11-22 LAB — BRAIN NATRIURETIC PEPTIDE: B Natriuretic Peptide: 30.8 pg/mL (ref 0.0–100.0)

## 2014-11-22 MED ORDER — ACETAMINOPHEN 650 MG RE SUPP: 650.0000 mg | Freq: Four times a day (QID) | RECTAL | Status: DC | PRN

## 2014-11-22 MED ORDER — ALBUTEROL SULFATE HFA 108 (90 BASE) MCG/ACT IN AERS: 1.0000 | INHALATION_SPRAY | Freq: Four times a day (QID) | RESPIRATORY_TRACT | Status: DC | PRN

## 2014-11-22 MED ORDER — AZITHROMYCIN 250 MG PO TABS: 250.0000 mg | ORAL_TABLET | Freq: Every day | ORAL | Status: DC

## 2014-11-22 MED ORDER — INSULIN ASPART 100 UNIT/ML ~~LOC~~ SOLN
25.0000 [IU] | Freq: Three times a day (TID) | SUBCUTANEOUS | Status: DC
  Administered 2014-11-22 – 2014-11-24 (×5): 25 [IU] via SUBCUTANEOUS

## 2014-11-22 MED ORDER — ASPIRIN 81 MG PO CHEW
81.0000 mg | CHEWABLE_TABLET | Freq: Every day | ORAL | Status: DC
  Administered 2014-11-22 – 2014-11-23 (×2): 81 mg via ORAL
  Filled 2014-11-22 (×3): qty 1

## 2014-11-22 MED ORDER — OMEGA-3-ACID ETHYL ESTERS 1 G PO CAPS
1.0000 g | ORAL_CAPSULE | Freq: Every day | ORAL | Status: DC
  Administered 2014-11-22 – 2014-11-23 (×2): 1 g via ORAL
  Filled 2014-11-22 (×3): qty 1

## 2014-11-22 MED ORDER — INSULIN GLARGINE 100 UNIT/ML ~~LOC~~ SOLN
35.0000 [IU] | Freq: Two times a day (BID) | SUBCUTANEOUS | Status: DC
  Administered 2014-11-22 – 2014-11-23 (×3): 35 [IU] via SUBCUTANEOUS
  Filled 2014-11-22 (×4): qty 0.35

## 2014-11-22 MED ORDER — ENOXAPARIN SODIUM 40 MG/0.4ML ~~LOC~~ SOLN
40.0000 mg | SUBCUTANEOUS | Status: DC
  Administered 2014-11-22 – 2014-11-23 (×2): 40 mg via SUBCUTANEOUS
  Filled 2014-11-22 (×3): qty 0.4

## 2014-11-22 MED ORDER — TRAMADOL HCL 50 MG PO TABS: 50.0000 mg | ORAL_TABLET | Freq: Four times a day (QID) | ORAL | Status: DC | PRN

## 2014-11-22 MED ORDER — LISINOPRIL 2.5 MG PO TABS
2.5000 mg | ORAL_TABLET | Freq: Two times a day (BID) | ORAL | Status: DC
  Administered 2014-11-22 – 2014-11-23 (×3): 2.5 mg via ORAL
  Filled 2014-11-22 (×5): qty 1

## 2014-11-22 MED ORDER — INSULIN ASPART 100 UNIT/ML ~~LOC~~ SOLN
0.0000 [IU] | Freq: Three times a day (TID) | SUBCUTANEOUS | Status: DC
Start: 2014-11-23 — End: 2014-11-22

## 2014-11-22 MED ORDER — IPRATROPIUM-ALBUTEROL 0.5-2.5 (3) MG/3ML IN SOLN
3.0000 mL | Freq: Once | RESPIRATORY_TRACT | Status: AC
  Administered 2014-11-22: 3 mL via RESPIRATORY_TRACT
  Filled 2014-11-22: qty 3

## 2014-11-22 MED ORDER — METHYLPREDNISOLONE SODIUM SUCC 40 MG IJ SOLR
40.0000 mg | Freq: Two times a day (BID) | INTRAMUSCULAR | Status: DC
  Administered 2014-11-22 – 2014-11-23 (×2): 40 mg via INTRAVENOUS
  Filled 2014-11-22 (×3): qty 1

## 2014-11-22 MED ORDER — LIRAGLUTIDE 18 MG/3ML ~~LOC~~ SOPN: 1.8000 mg | PEN_INJECTOR | Freq: Every day | SUBCUTANEOUS | Status: DC

## 2014-11-22 MED ORDER — PRASUGREL HCL 10 MG PO TABS
10.0000 mg | ORAL_TABLET | Freq: Every day | ORAL | Status: DC
  Administered 2014-11-22 – 2014-11-23 (×2): 10 mg via ORAL
  Filled 2014-11-22 (×3): qty 1

## 2014-11-22 MED ORDER — ROSUVASTATIN CALCIUM 40 MG PO TABS: 40.0000 mg | ORAL_TABLET | Freq: Every day | ORAL | Status: DC

## 2014-11-22 MED ORDER — ATORVASTATIN CALCIUM 80 MG PO TABS
80.0000 mg | ORAL_TABLET | Freq: Every day | ORAL | Status: DC
  Administered 2014-11-22: 80 mg via ORAL
  Filled 2014-11-22 (×4): qty 1

## 2014-11-22 MED ORDER — METHYLPREDNISOLONE SODIUM SUCC 125 MG IJ SOLR
125.0000 mg | Freq: Once | INTRAMUSCULAR | Status: AC
  Administered 2014-11-22: 125 mg via INTRAVENOUS
  Filled 2014-11-22: qty 2

## 2014-11-22 MED ORDER — ALBUTEROL (5 MG/ML) CONTINUOUS INHALATION SOLN
10.0000 mg/h | INHALATION_SOLUTION | RESPIRATORY_TRACT | Status: DC
  Administered 2014-11-22: 10 mg/h via RESPIRATORY_TRACT

## 2014-11-22 MED ORDER — MOMETASONE FURO-FORMOTEROL FUM 200-5 MCG/ACT IN AERO
2.0000 | INHALATION_SPRAY | Freq: Two times a day (BID) | RESPIRATORY_TRACT | Status: DC
  Administered 2014-11-22 – 2014-11-24 (×4): 2 via RESPIRATORY_TRACT
  Filled 2014-11-22: qty 8.8

## 2014-11-22 MED ORDER — INSULIN ASPART 100 UNIT/ML ~~LOC~~ SOLN
0.0000 [IU] | Freq: Every day | SUBCUTANEOUS | Status: DC
  Administered 2014-11-22: 5 [IU] via SUBCUTANEOUS

## 2014-11-22 MED ORDER — DM-GUAIFENESIN ER 30-600 MG PO TB12
2.0000 | ORAL_TABLET | Freq: Two times a day (BID) | ORAL | Status: DC
  Administered 2014-11-22 – 2014-11-23 (×3): 2 via ORAL
  Filled 2014-11-22 (×5): qty 2

## 2014-11-22 MED ORDER — IPRATROPIUM-ALBUTEROL 0.5-2.5 (3) MG/3ML IN SOLN
3.0000 mL | RESPIRATORY_TRACT | Status: DC | PRN
  Administered 2014-11-22 – 2014-11-23 (×2): 3 mL via RESPIRATORY_TRACT
  Filled 2014-11-22 (×4): qty 3

## 2014-11-22 MED ORDER — AZITHROMYCIN 500 MG PO TABS
500.0000 mg | ORAL_TABLET | Freq: Every day | ORAL | Status: DC
  Administered 2014-11-22 – 2014-11-23 (×2): 500 mg via ORAL
  Filled 2014-11-22 (×3): qty 1

## 2014-11-22 MED ORDER — PREDNISONE 10 MG PO TABS: ORAL_TABLET | ORAL | Status: DC

## 2014-11-22 MED ORDER — ACETAMINOPHEN 325 MG PO TABS: 650.0000 mg | ORAL_TABLET | Freq: Four times a day (QID) | ORAL | Status: DC | PRN

## 2014-11-22 MED ORDER — SODIUM CHLORIDE 0.9 % IJ SOLN: 3.0000 mL | INTRAMUSCULAR | Status: DC | PRN

## 2014-11-22 MED ORDER — ATENOLOL 25 MG PO TABS
25.0000 mg | ORAL_TABLET | Freq: Every day | ORAL | Status: DC
  Administered 2014-11-22 – 2014-11-23 (×2): 25 mg via ORAL
  Filled 2014-11-22 (×3): qty 1

## 2014-11-22 MED ORDER — SODIUM CHLORIDE 0.9 % IJ SOLN
3.0000 mL | Freq: Two times a day (BID) | INTRAMUSCULAR | Status: DC
  Administered 2014-11-22 – 2014-11-23 (×3): 3 mL via INTRAVENOUS

## 2014-11-22 MED ORDER — SODIUM CHLORIDE 0.9 % IV SOLN: 250.0000 mL | INTRAVENOUS | Status: DC | PRN

## 2014-11-22 MED ORDER — NITROGLYCERIN 0.4 MG SL SUBL: 0.4000 mg | SUBLINGUAL_TABLET | SUBLINGUAL | Status: DC | PRN

## 2014-11-22 MED ORDER — INSULIN ASPART 100 UNIT/ML ~~LOC~~ SOLN
0.0000 [IU] | Freq: Three times a day (TID) | SUBCUTANEOUS | Status: DC
  Administered 2014-11-23: 3 [IU] via SUBCUTANEOUS
  Administered 2014-11-23 (×2): 5 [IU] via SUBCUTANEOUS
  Administered 2014-11-24: 2 [IU] via SUBCUTANEOUS

## 2014-11-22 NOTE — ED Notes (Signed)
Bed: WTR8 Expected date:  Expected time:  Means of arrival:  Comments: 

## 2014-11-22 NOTE — H&P (Signed)
[facility] History and Physical  Corneal Elick WUJ:[id number] DOB: 1961-08-10 DOA: 11/22/2014  Referring physician: Dr. Baxter Hire Ward PCP: [name], MD  Specialists:   Chief Complaint: Shortness of breath, cough  HPI: [name] [name] is a 54 y.o. male  History of COPD, hypertension, coronary artery disease, presented to the emergency department with complaints of shortness of breath and cough" for approximately 1 week. Patient states his remaining has also had a cough. He denies any recent travel. Patient states nothing seems to make his cough better. Laying down does cause him to be short of breath. Patient states he is unable to walk more than 1 block without becoming short of breath. Patient states he cannot lie flat and uses 2 pillows at night. Patient also uses his CPAP. He does admit to recent cigarette smoking. States he has had a productive cough however cannot remember the color of his sputum.  Patient also complains of runny nose and watery eyes. Patient denies any chest pain, abdominal pain, nausea, vomiting, or urinary symptoms. Patient was seen in the emergency department, chest x-ray showed no acute pulmonary process. Patient was given DuoNeb treatments as well as Solu-Medrol. Upon ambulation, patient desatted into the 80s. [facility] was called for admission.  Review of Systems:  Constitutional: Denies fever, chills, diaphoresis, appetite change and fatigue.  HEENT: Denies photophobia, eye pain, redness, hearing loss, ear pain, congestion, sore throat, rhinorrhea, sneezing, mouth sores, trouble swallowing, neck pain, neck stiffness and tinnitus.   Respiratory: Complains of shortness of breath, wheezing, cough  Cardiovascular: Denies chest pain, palpitations and leg swelling.  Gastrointestinal: Denies nausea, vomiting, abdominal pain, diarrhea, constipation, blood in stool and abdominal distention.  Genitourinary: Denies dysuria, urgency, frequency, hematuria, flank pain and  difficulty urinating.  Musculoskeletal: Denies myalgias, back pain, joint swelling, arthralgias and gait problem.  Skin: Denies pallor, rash and wound.  Neurological: Denies dizziness, seizures, syncope, weakness, light-headedness, numbness and headaches.  Hematological: Denies adenopathy. Easy bruising, personal or family bleeding history  Psychiatric/Behavioral: Denies suicidal ideation, mood changes, confusion, nervousness, sleep disturbance and agitation  Past Medical History  Diagnosis Date  . Hypertension   . Hyperlipidemia   . CAD S/P percutaneous coronary angioplasty 01/18/12; 10/2013    a. pRCA 3.5 x 18 vision BMS - 4.2 mm; b. 2/'15: mCx 3.5 x 12  Rebel BMS (3.6-3.7 mm)  . GERD (gastroesophageal reflux disease)   . Type II diabetes mellitus   . Chronic back pain     "mid/lower" (08/01/2014)  . Depression   . Non-ST elevation myocardial infarction (NSTEMI) 10/2013  . COPD (chronic obstructive pulmonary disease)     "I'm seeing COPD dr now; don't know if I've got it" (08/01/2014)  . OSA on CPAP 01/19/2012  . Migraines     "once in awhile" (08/01/2014)  . Arthritis     "qwhere"   Past Surgical History  Procedure Laterality Date  . Coronary angioplasty with stent placement  01/2012; 11/07/2013; 08/01/2014    "1 + 2 + 1"   . Cardiac catheterization  07/2014    Left Main: Short, large-caliber vessel. Widely patent. Bifurcates into the LAD and Circumflex. Angiographically normal.  . Coronary angioplasty with stent placement  07/2014    Severe single-vessel disease involving the bifurcation of OM1 and OM 2 with 99% in-stent restenosis in the bare-metal stent placed in the OM 2.  . Left heart catheterization with coronary angiogram N/A 01/20/2012    Procedure: LEFT HEART CATHETERIZATION WITH CORONARY ANGIOGRAM;  Surgeon: Corky Crafts,  MD;  Location: [facility] CATH LAB;  Service: Cardiovascular;  Laterality: N/A;  . Percutaneous coronary stent intervention (pci-s)  01/20/2012    Procedure:  PERCUTANEOUS CORONARY STENT INTERVENTION (PCI-S);  Surgeon: Corky Crafts, MD;  Location[facility] CATH LAB;  Service: Cardiovascular;;  . Left heart catheterization with coronary angiogram N/A 11/07/2013    Procedure: LEFT HEART CATHETERIZATION WITH CORONARY ANGIOGRAM;  Surgeon: Corky Crafts, MD;  Location: [facility] CATH LAB;  Service: Cardiovascular;  Laterality: N/A;  . Percutaneous coronary stent intervention (pci-s)  11/07/2013    Procedure: PERCUTANEOUS CORONARY STENT INTERVENTION (PCI-S);  Surgeon: Corky Crafts, MD;  Location: [facility] CATH LAB;  Service: Cardiovascular;;  . Left heart catheterization with coronary angiogram N/A 08/01/2014    Procedure: LEFT HEART CATHETERIZATION WITH CORONARY ANGIOGRAM;  Surgeon: Marykay Lex, MD;  Location: [facility] CATH LAB;  Service: Cardiovascular;  Laterality: N/A;   Social History:  reports that he has been smoking Cigarettes.  He has a 18.5 pack-year smoking history. He has never used smokeless tobacco. He reports that he drinks about 7.2 oz of alcohol per week. He reports that he does not use illicit drugs.   Allergies  Allergen Reactions  . Shellfish Allergy Nausea And Vomiting    Family History  Problem Relation Age of Onset  . Heart attack Father 25  . Heart attack Brother   . Stroke Brother   . Cancer Mother     breast  . Hypertension Sister   . Hyperlipidemia Sister   . Hyperlipidemia Sister   . Hypertension Sister    Prior to Admission medications   Medication Sig Start Date End Date Taking? Authorizing Provider  ACCU-CHEK FASTCLIX LANCETS MISC 1 Units by Percutaneous route 4 (four) times daily. 01/17/13   [name], MD  acetaminophen (TYLENOL) 500 MG tablet Take 2 tablets (1,000 mg total) by mouth every 6 (six) hours as needed for mild pain or moderate pain. Patient not taking: Reported on 10/14/2014 07/31/14   Moses Manners, MD  albuterol (PROVENTIL HFA;VENTOLIN HFA) 108 (90 BASE) MCG/ACT inhaler Inhale 1-2 puffs into the  lungs every 6 (six) hours as needed for wheezing or shortness of breath. 11/22/14   Elson Areas, PA-C  aspirin 81 MG chewable tablet Chew 1 tablet (81 mg total) by mouth daily. 10/04/13   [name], MD  atenolol (TENORMIN) 25 MG tablet Take 1 tablet (25 mg total) by mouth daily. 07/08/14   [name], MD  atorvastatin (LIPITOR) 80 MG tablet Take 1 tablet (80 mg total) by mouth daily. 10/14/14   Doreene Eland, MD  azithromycin (ZITHROMAX) 250 MG tablet Take 1 tablet (250 mg total) by mouth daily. Take first 2 tablets together, then 1 every day until finished. 11/22/14   Elson Areas, PA-C  insulin aspart (NOVOLOG) 100 UNIT/ML injection Inject 25 Units into the skin 3 (three) times daily with meals. 04/24/14   Moses Manners, MD  insulin glargine (LANTUS) 100 UNIT/ML injection Inject 0.35 mLs (35 Units total) into the skin 2 (two) times daily. 10/30/14   [name], MD  Insulin Syringe-Needle U-100 (FREESTYLE PRECISION INS SYR) 31G X 5/16" 1 ML MISC 1 Units by Does not apply route as directed. Dx: Diabetes 250.02.  Dispense:  QS for 5 shots daily - 1 month supply 05/15/14   Moses Manners, MD  Liraglutide 18 MG/3ML SOPN Inject 1.8 mg into the skin daily. Dispense QS for 1 month supply 05/15/14   Chrissie Noa  A Hensel, MD  lisinopril (PRINIVIL,ZESTRIL) 5 MG tablet Take 0.5 tablets (2.5 mg total) by mouth 2 (two) times daily. 06/26/14   Moses Manners, MD  metFORMIN (GLUCOPHAGE) 1000 MG tablet Take 1 tablet (1,000 mg total) by mouth 2 (two) times daily with a meal. 06/26/14   Moses Manners, MD  mometasone-formoterol (DULERA) 200-5 MCG/ACT AERO Inhale 2 puffs into the lungs 2 (two) times daily. Dispense QS for 1 month supply. 06/26/14   Moses Manners, MD  nitroGLYCERIN (NITROSTAT) 0.4 MG SL tablet Place 1 tablet (0.4 mg total) under the tongue every 5 (five) minutes x 3 doses as needed for chest pain. 11/13/14   [name], MD  Omega-3 Fatty Acids (FISH OIL PO) Take 1 capsule by mouth 2 (two)  times daily.     Historical Provider, MD  prasugrel (EFFIENT) 10 MG TABS tablet Take 1 tablet (10 mg total) by mouth daily. 08/28/14   Tobey Grim, MD  predniSONE (DELTASONE) 10 MG tablet 5,4,0,9,8,1  taper 11/22/14   Elson Areas, PA-C  rosuvastatin (CRESTOR) 40 MG tablet Take 1 tablet (40 mg total) by mouth daily. 10/19/14   [name], MD  traMADol (ULTRAM) 50 MG tablet Take 50 mg by mouth every 6 (six) hours as needed for severe pain. 10/04/13   [name], MD   Physical Exam: Filed Vitals:   11/22/14 1600  BP: 132/81  Pulse: 105  Resp:      General: Well developed, well nourished, NAD, appears stated age  HEENT: NCAT, PERRLA, EOMI, Anicteic Sclera, mucous membranes moist.   Neck: Supple, no JVD, no masses  Cardiovascular: S1 S2 auscultated, no rubs, murmurs or gallops. Regular rate and rhythm.  Respiratory: Diminished breath sounds bilaterally, no current wheezing  Abdomen: Soft, obese, nontender, nondistended, + bowel sounds  Extremities: warm dry without cyanosis clubbing or edema  Neuro: AAOx3, cranial nerves grossly intact. Strength 5/5 in patient's upper and lower extremities bilaterally  Skin: Without rashes exudates or nodules  Psych: Normal affect and demeanor   Labs on Admission:  Basic Metabolic Panel:  Recent Labs Lab 11/22/14 1112  NA 133*  K 4.5  CL 102  CO2 24  GLUCOSE 248*  BUN 10  CREATININE 0.81  CALCIUM 8.3*   Liver Function Tests:  Recent Labs Lab 11/22/14 1112  AST 32  ALT 31  ALKPHOS 86  BILITOT 0.9  PROT 6.6  ALBUMIN 3.4*   No results for input(s): LIPASE, AMYLASE in the last 168 hours. No results for input(s): AMMONIA in the last 168 hours. CBC:  Recent Labs Lab 11/22/14 1112  WBC 10.3  NEUTROABS 6.2  HGB 16.5  HCT 46.3  MCV 88.9  PLT 279   Cardiac Enzymes: No results for input(s): CKTOTAL, CKMB, CKMBINDEX, TROPONINI in the last 168 hours.  BNP (last 3 results) No results for input(s): BNP in the last  8760 hours.  ProBNP (last 3 results) No results for input(s): PROBNP in the last 8760 hours.  CBG: No results for input(s): GLUCAP in the last 168 hours.  Radiological Exams on Admission: Dg Chest 2 View  11/22/2014   CLINICAL DATA:  SOB. Cough and congestion for about a week. Dizziness and lightheaded. Hx HTN. Diabetic. COPD. Smoker.  EXAM: CHEST - 2 VIEW  COMPARISON:  11/07/2013  FINDINGS: Lungs are clear. Chronic perihilar interstitial prominence . Improvement in the pulmonary vascular congestion since prior study. Heart size and mediastinal contours are within normal limits. No effusion.  Minimal spurring in the lower thoracic spine.  IMPRESSION: No acute cardiopulmonary disease.   Electronically Signed   By: Corlis Leak M.D.   On: 11/22/2014 11:44    EKG: None  Assessment/Plan  Acute Respiratory failure with hypoxia secondary to COPD exacerbation -In the emergency department, patient had oxygen saturations as low as 87% -CXR: no acute cardiopulmonary disease -Will place patient on azithromycin, DuoNeb nebs, Solu-Medrol, antitussives, oxygen to maintain saturations above 92% -Continue Dulera -Patient currently afebrile with no leukocytosis -Have also asked for the emergency department to obtain a BNP as patient's shortness of breath does worsen with lying down.  COPD exacerbation secondary to URI -Treatment and plan as above  Diabetes mellitus, Type2 -Metformin Held -Continue home Lantus and NovoLog regimen, Liraglutide, will add insulin sliding scale CBG monitoring -Last hemoglobin A1c in February 2016 7.7  History of CAD -Currently no chest pain -Continue lisinopril, aspirin, statin, Effient, atenolol  OSA -Continue CPAP nightly  Depression -Currently on no medications -Stable  Hypertension -Stable, continue lisinopril, atenolol  Hyperlipidemia -Continue statin, fish oil  Tobacco abuse -Patient counseled on smoking cessation  DVT prophylaxis: Lovenox   Code  Status: Full  Condition: Guarded   Family Communication: None at bedside. Admission, patients condition and plan of care including tests being ordered have been discussed with the patient, who indicates understanding and agrees with the plan and Code Status.  Disposition Plan: Admitted.    Time spent: 60 minutes  Micahel Omlor D.O. [facility] Pager [phone]  If 7PM-7AM, please contact night-coverage [email or web address] Password [facility] 11/22/2014, 4:17 PM

## 2014-11-22 NOTE — ED Notes (Addendum)
Hospitalist was sent message to confirm whether pt needs tele bed with cardiac hx. Hospitalist advised that pt "does not need tele bed just because they have cardiac history. I'm not monitoring his heart." When room is clean, pt will be transported to the floor

## 2014-11-22 NOTE — ED Notes (Signed)
RT notified for neb  

## 2014-11-22 NOTE — Progress Notes (Signed)
RT placed pt on auto titrate CPAP 5-20cmH2O via ffm on room air. Pt states he is comfortable and is tolerating CPAP well at this time. RT will continue to monitor as needed.

## 2014-11-22 NOTE — ED Notes (Signed)
Pt c/o productive cough x 5 days.  Pt labored in breathing in triage.  87% on RA.  Pt placed on 2L [place] with some relief.  When asked if pt has COPD, pt states "I go see a COPD doctor but I don't know if I got it or not".

## 2014-11-22 NOTE — ED Notes (Signed)
Hold d/c per Santiago Glad, [place] so that pt may receive another neb

## 2014-11-22 NOTE — Discharge Instructions (Signed)
Chronic Asthmatic Bronchitis Chronic asthmatic bronchitis is a complication of persistent asthma. After a period of time with asthma, some people develop airflow obstruction that is present all the time, even when not having an asthma attack.There is also persistent inflammation of the airways, and the bronchial tubes produce more mucus. Chronic asthmatic bronchitis usually is a permanent problem with the lungs. CAUSES  Chronic asthmatic bronchitis happens most often in people who have asthma and also smoke cigarettes. Occasionally, it can happen to a person with long-standing or severe asthma even if the person is not a smoker. SIGNS AND SYMPTOMS  Chronic asthmatic bronchitis usually causes symptoms of both asthma and chronic bronchitis, including:   Coughing.  Increased sputum production.  Wheezing and shortness of breath.  Chest discomfort.  Recurring infections. DIAGNOSIS  Your health care provider will take a medical history and perform a physical exam. Chronic asthmatic bronchitis is suspected when a person with asthma has abnormal results on breathing tests (pulmonary function tests) even when breathing symptoms are at their best. Other tests, such as a chest X-ray, may be performed to rule out other conditions.  TREATMENT  Treatment involves controlling symptoms with medicine and lifestyle changes.  Your health care provider may prescribe asthma medicines, including inhaler and nebulizer medicines.  Infection can be treated with medicine to kill germs (antibiotics). Serious infections may require hospitalization. These can include:  Pneumonia.  Sinus infections.  Acute bronchitis.   Preventing infection and hospitalization is very important. Get an influenza vaccination every year as directed by your health care provider. Ask your health care provider whether you need a pneumonia vaccine.  Ask your health care provider whether you would benefit from a pulmonary  rehabilitation program. HOME CARE INSTRUCTIONS  Take medicines only as directed by your health care provider.  If you are a cigarette smoker, the most important thing that you can do is quit. Talk to your health care provider for help with quitting smoking.  Avoid pollen, dust, animal dander, molds, smoke, and other things that cause attacks.  Regular exercise is very important to help you feel better. Discuss possible exercise routines with your health care provider.  If animal dander is the cause of asthma, you may not be able to keep pets.  It is important that you:  Become educated about your medical condition.  Participate in maintaining wellness.  Seek medical care as directed. Delay in seeking medical care could cause permanent injury and may be a risk to your life. SEEK MEDICAL CARE IF:  You have wheezing and shortness of breath even if taking medicine to prevent attacks.  You have muscle aches, chest pain, or thickening of sputum.  Your sputum changes from clear or white to yellow, green, gray, or bloody. SEEK IMMEDIATE MEDICAL CARE IF:  Your usual medicines do not stop your wheezing.  You have increased coughing or shortness of breath or both.  You have increased difficulty breathing.  You have any problems from the medicine you are taking, such as a rash, itching, swelling, or trouble breathing. MAKE SURE YOU:   Understand these instructions.  Will watch your condition.  Will get help right away if you are not doing well or get worse. Document Released: 06/16/2006 Document Revised: 01/13/2014 Document Reviewed: 10/07/2013 Exit[employer] Patient Information 2015 [employer]. This information is not intended to replace advice given to you by your health care provider. Make sure you discuss any questions you have with your health care provider.  

## 2014-11-22 NOTE — ED Notes (Signed)
Patient ambulated to restroom no difficulty-walked with pulse ox-did not go up higher than 90%. RA Santiago Glad notified

## 2014-11-22 NOTE — ED Notes (Signed)
RT notified that pt needs cont neb

## 2014-11-22 NOTE — Progress Notes (Addendum)
Was called by Ms. [name] Cutter, RN to verify if patient needed a tele bed.  I simply stated that I understand patient has a cardiac history, however, vitals signs are stable and does not need a tele bed.  He is here for COPD exacerbation. Patient does not have chest pain.

## 2014-11-22 NOTE — Progress Notes (Signed)
Patients cbg [phone], after coming to floor.  Dr. Ree Kida notified, with request to give sliding scale and meal coverage.

## 2014-11-22 NOTE — ED Provider Notes (Addendum)
CSN: [id number]     Arrival date & time 11/22/14  0940 History   First MD Initiated Contact with Patient 11/22/14 1005     Chief Complaint  Patient presents with  . Cough     (Consider location/radiation/quality/duration/timing/severity/associated sxs/prior Treatment) Patient is a 54 y.o. male presenting with cough. No language interpreter was used.  Cough Cough characteristics:  Productive Sputum characteristics:  Bloody Severity:  Moderate Onset quality:  Gradual Timing:  Constant Progression:  Worsening Chronicity:  New Smoker: no   Relieved by:  Nothing Worsened by:  Nothing tried Ineffective treatments:  None tried Associated symptoms: rhinorrhea and shortness of breath     Past Medical History  Diagnosis Date  . Hypertension   . Hyperlipidemia   . CAD S/P percutaneous coronary angioplasty 01/18/12; 10/2013    a. pRCA 3.5 x 18 vision BMS - 4.2 mm; b. 2/'15: mCx 3.5 x 12  Rebel BMS (3.6-3.7 mm)  . GERD (gastroesophageal reflux disease)   . Type II diabetes mellitus   . Chronic back pain     "mid/lower" (08/01/2014)  . Depression   . Non-ST elevation myocardial infarction (NSTEMI) 10/2013  . COPD (chronic obstructive pulmonary disease)     "I'm seeing COPD dr now; don't know if I've got it" (08/01/2014)  . OSA on CPAP 01/19/2012  . Migraines     "once in awhile" (08/01/2014)  . Arthritis     "qwhere"   Past Surgical History  Procedure Laterality Date  . Coronary angioplasty with stent placement  01/2012; 11/07/2013; 08/01/2014    "1 + 2 + 1"   . Cardiac catheterization  07/2014    Left Main: Short, large-caliber vessel. Widely patent. Bifurcates into the LAD and Circumflex. Angiographically normal.  . Coronary angioplasty with stent placement  07/2014    Severe single-vessel disease involving the bifurcation of OM1 and OM 2 with 99% in-stent restenosis in the bare-metal stent placed in the OM 2.  . Left heart catheterization with coronary angiogram N/A 01/20/2012   Procedure: LEFT HEART CATHETERIZATION WITH CORONARY ANGIOGRAM;  Surgeon: Jettie Booze, MD;  Location: [facility] CATH LAB;  Service: Cardiovascular;  Laterality: N/A;  . Percutaneous coronary stent intervention (pci-s)  01/20/2012    Procedure: PERCUTANEOUS CORONARY STENT INTERVENTION (PCI-S);  Surgeon: Jettie Booze, MD;  Location: [facility] CATH LAB;  Service: Cardiovascular;;  . Left heart catheterization with coronary angiogram N/A 11/07/2013    Procedure: LEFT HEART CATHETERIZATION WITH CORONARY ANGIOGRAM;  Surgeon: Jettie Booze, MD;  Location: [facility] CATH LAB;  Service: Cardiovascular;  Laterality: N/A;  . Percutaneous coronary stent intervention (pci-s)  11/07/2013    Procedure: PERCUTANEOUS CORONARY STENT INTERVENTION (PCI-S);  Surgeon: Jettie Booze, MD;  Location: [facility] CATH LAB;  Service: Cardiovascular;;  . Left heart catheterization with coronary angiogram N/A 08/01/2014    Procedure: LEFT HEART CATHETERIZATION WITH CORONARY ANGIOGRAM;  Surgeon: Leonie Man, MD;  Location: [facility] CATH LAB;  Service: Cardiovascular;  Laterality: N/A;   Family History  Problem Relation Age of Onset  . Heart attack Father 57  . Heart attack Brother   . Stroke Brother   . Cancer Mother     breast  . Hypertension Sister   . Hyperlipidemia Sister   . Hyperlipidemia Sister   . Hypertension Sister    History  Substance Use Topics  . Smoking status: Current Some Day Smoker -- 0.50 packs/day for 37 years    Types: Cigarettes  . Smokeless tobacco: Never Used  . Alcohol  Use: 7.2 oz/week    12 Cans of beer per week     Comment: 08/01/2014 "8-12 beers once/ week"    Review of Systems  HENT: Positive for rhinorrhea.   Respiratory: Positive for cough and shortness of breath.   All other systems reviewed and are negative.     Allergies  Shellfish allergy  Home Medications   Prior to Admission medications   Medication Sig Start Date End Date Taking? Authorizing Provider  ACCU-CHEK FASTCLIX  LANCETS MISC 1 Units by Percutaneous route 4 (four) times daily. 01/17/13   [name], MD  acetaminophen (TYLENOL) 500 MG tablet Take 2 tablets (1,000 mg total) by mouth every 6 (six) hours as needed for mild pain or moderate pain. Patient not taking: Reported on 10/14/2014 07/31/14   Zenia Resides, MD  albuterol (PROVENTIL HFA;VENTOLIN HFA) 108 (90 BASE) MCG/ACT inhaler Inhale 2 puffs into the lungs every 6 (six) hours as needed for wheezing or shortness of breath. 06/26/14   Zenia Resides, MD  aspirin 81 MG chewable tablet Chew 1 tablet (81 mg total) by mouth daily. 10/04/13   [name], MD  atenolol (TENORMIN) 25 MG tablet Take 1 tablet (25 mg total) by mouth daily. 07/08/14   [name], MD  atorvastatin (LIPITOR) 80 MG tablet Take 1 tablet (80 mg total) by mouth daily. 10/14/14   Kinnie Feil, MD  insulin aspart (NOVOLOG) 100 UNIT/ML injection Inject 25 Units into the skin 3 (three) times daily with meals. 04/24/14   Zenia Resides, MD  insulin glargine (LANTUS) 100 UNIT/ML injection Inject 0.35 mLs (35 Units total) into the skin 2 (two) times daily. 10/30/14   [name], MD  Insulin Syringe-Needle U-100 (FREESTYLE PRECISION INS SYR) 31G X 5/16" 1 ML MISC 1 Units by Does not apply route as directed. Dx: Diabetes 250.02.  Dispense:  QS for 5 shots daily - 1 month supply 05/15/14   Zenia Resides, MD  Liraglutide 18 MG/3ML SOPN Inject 1.8 mg into the skin daily. Dispense QS for 1 month supply 05/15/14   Zenia Resides, MD  lisinopril (PRINIVIL,ZESTRIL) 5 MG tablet Take 0.5 tablets (2.5 mg total) by mouth 2 (two) times daily. 06/26/14   Zenia Resides, MD  metFORMIN (GLUCOPHAGE) 1000 MG tablet Take 1 tablet (1,000 mg total) by mouth 2 (two) times daily with a meal. 06/26/14   Zenia Resides, MD  mometasone-formoterol (DULERA) 200-5 MCG/ACT AERO Inhale 2 puffs into the lungs 2 (two) times daily. Dispense QS for 1 month supply. 06/26/14   Zenia Resides, MD  nitroGLYCERIN  (NITROSTAT) 0.4 MG SL tablet Place 1 tablet (0.4 mg total) under the tongue every 5 (five) minutes x 3 doses as needed for chest pain. 11/13/14   [name], MD  Omega-3 Fatty Acids (FISH OIL PO) Take 1 capsule by mouth 2 (two) times daily.     Historical Provider, MD  prasugrel (EFFIENT) 10 MG TABS tablet Take 1 tablet (10 mg total) by mouth daily. 08/28/14   Alveda Reasons, MD  rosuvastatin (CRESTOR) 40 MG tablet Take 1 tablet (40 mg total) by mouth daily. 10/19/14   [name], MD  traMADol (ULTRAM) 50 MG tablet Take 50 mg by mouth every 6 (six) hours as needed for severe pain. 10/04/13   [name], MD   BP 111/67 mmHg  Pulse 93  Temp(Src)   Resp 25  SpO2 90% Physical Exam  Constitutional: He  is oriented to person, place, and time. He appears well-developed and well-nourished.  HENT:  Head: Normocephalic and atraumatic.  Right Ear: External ear normal.  Left Ear: External ear normal.  Nose: Nose normal.  Mouth/Throat: Oropharynx is clear and moist.  Eyes: Conjunctivae and EOM are normal. Pupils are equal, round, and reactive to light.  Neck: Normal range of motion.  Cardiovascular: Normal rate.   Pulmonary/Chest: Effort normal and breath sounds normal.  Abdominal: Soft. He exhibits no distension.  Musculoskeletal: Normal range of motion.  Neurological: He is alert and oriented to person, place, and time.  Skin: Skin is warm.  Psychiatric: He has a normal mood and affect.  Nursing note and vitals reviewed.   ED Course  Procedures (including critical care time) Labs Review Labs Reviewed - No data to display  Imaging Review Dg Chest 2 View  11/22/2014   CLINICAL DATA:  SOB. Cough and congestion for about a week. Dizziness and lightheaded. Hx HTN. Diabetic. COPD. Smoker.  EXAM: CHEST - 2 VIEW  COMPARISON:  11/07/2013  FINDINGS: Lungs are clear. Chronic perihilar interstitial prominence . Improvement in the pulmonary vascular congestion since prior study. Heart size and  mediastinal contours are within normal limits. No effusion. Minimal spurring in the lower thoracic spine.  IMPRESSION: No acute cardiopulmonary disease.   Electronically Signed   By: Lucrezia Europe M.D.   On: 11/22/2014 11:44     EKG Interpretation None    Pt given 1 hour continous neb.   Pt reports he feels much better.   MDM  Some pulmonary vascular congestion on chest xray.   Pt given solumedrol iv.  Duo neb. Pt given rx for zithromax, prednisone taper and albuterol inhaler.   Pt is advised to see Dr. Kennon Rounds for evaluaiton.   Pt ambulated to restroom.  Pt unable to tolerate and had to have a wheel chair.  Pt reports he is normally able to walk one block but no further   Final diagnoses:  Cough  COPD exacerbation    I will consult hospitalist for admission    Fransico Meadow, PA-C 11/22/14 [place], DO 11/22/14 [place], PA-C 11/22/14 [place], DO 11/22/14 1536

## 2014-11-22 NOTE — ED Notes (Signed)
NP Santiago Glad made aware of patient's sats at 90% on 2 L. He normally does not use O2 at home.

## 2014-11-22 NOTE — ED Notes (Signed)
Carmelina Dane at bedside

## 2014-11-23 LAB — BASIC METABOLIC PANEL
ANION GAP: 12 (ref 5–15)
BUN: 14 mg/dL (ref 6–23)
CALCIUM: 8.9 mg/dL (ref 8.4–10.5)
CO2: 21 mmol/L (ref 19–32)
CREATININE: 0.79 mg/dL (ref 0.50–1.35)
Chloride: 100 mmol/L (ref 96–112)
GFR calc Af Amer: >90 mL/min (ref >90)
Glucose, Bld: 285 mg/dL — ABNORMAL HIGH (ref 70–99)
Potassium: 4.1 mmol/L (ref 3.5–5.1)
Sodium: 133 mmol/L — ABNORMAL LOW (ref 135–145)

## 2014-11-23 LAB — GLUCOSE, CAPILLARY
GLUCOSE-CAPILLARY: 275 mg/dL — AB (ref 70–99)
GLUCOSE-CAPILLARY: 232 mg/dL — AB (ref 70–99)
GLUCOSE-CAPILLARY: 150 mg/dL — AB (ref 70–99)
Glucose-Capillary: 267 mg/dL — ABNORMAL HIGH (ref 70–99)

## 2014-11-23 LAB — CBC
HCT: 42.8 % (ref 39.0–52.0)
HEMOGLOBIN: 15.5 g/dL (ref 13.0–17.0)
MCH: 32.1 pg (ref 26.0–34.0)
MCHC: 36.2 g/dL — AB (ref 30.0–36.0)
MCV: 88.6 fL (ref 78.0–100.0)
Platelets: 273 10*3/uL (ref 150–400)
RBC: 4.83 MIL/uL (ref 4.22–5.81)
RDW: 12.3 % (ref 11.5–15.5)
WBC: 15.1 10*3/uL — AB (ref 4.0–10.5)

## 2014-11-23 MED ORDER — DM-GUAIFENESIN ER 30-600 MG PO TB12: 2.0000 | ORAL_TABLET | Freq: Two times a day (BID) | ORAL | Status: DC

## 2014-11-23 MED ORDER — "INSULIN SYRINGE-NEEDLE U-100 31G X 5/16"" 1 ML MISC": 1.0000 [IU] | Status: DC

## 2014-11-23 MED ORDER — METHYLPREDNISOLONE SODIUM SUCC 40 MG IJ SOLR
40.0000 mg | Freq: Every day | INTRAMUSCULAR | Status: DC
  Filled 2014-11-23: qty 1

## 2014-11-23 MED ORDER — ACCU-CHEK FASTCLIX LANCETS MISC: 1.0000 [IU] | Freq: Four times a day (QID) | Status: DC

## 2014-11-23 MED ORDER — PREDNISONE 10 MG PO TABS: ORAL_TABLET | ORAL | Status: DC

## 2014-11-23 MED ORDER — AZITHROMYCIN 250 MG PO TABS: 250.0000 mg | ORAL_TABLET | Freq: Every day | ORAL | Status: DC

## 2014-11-23 NOTE — Progress Notes (Signed)
[facility]                                                                              Patient Demographics  [name] [name], is a 54 y.o. male, DOB - 06-30-1961, ION:[id number]  Admit date - 11/22/2014   Admitting Physician Edsel Petrin, DO  Outpatient Primary MD for the patient is [name], MD  LOS - 1   Chief Complaint  Patient presents with  . Cough      HPI on 11/22/2014  [name] [name] is a 54 y.o. male with a history of COPD, hypertension, coronary artery disease, presented to the emergency department with complaints of shortness of breath and cough" for approximately 1 week. Patient states his remaining has also had a cough. He denies any recent travel. Patient states nothing seems to make his cough better. Laying down does cause him to be short of breath. Patient states he is unable to walk more than 1 block without becoming short of breath. Patient states he cannot lie flat and uses 2 pillows at night. Patient also uses his CPAP. He does admit to recent cigarette smoking. States he has had a productive cough however cannot remember the color of his sputum. Patient also complains of runny nose and watery eyes. Patient denies any chest pain, abdominal pain, nausea, vomiting, or urinary symptoms. Patient was seen in the emergency department, chest x-ray showed no acute pulmonary process. Patient was given DuoNeb treatments as well as Solu-Medrol. Upon ambulation, patient desatted into the 80s. [facility] was called for admission.  Assessment & Plan   Acute Respiratory failure with hypoxia secondary to COPD exacerbation -In the emergency department, patient had oxygen saturations as low as 87% -CXR: no acute cardiopulmonary disease -Continue azithromycin, DuoNeb nebs, Dulera,  antitussives, oxygen to maintain saturations above 92% -Patient currently afebrile -BNP 30 -Will decrease steroids  COPD exacerbation secondary to URI -Treatment and plan as  above  Leukocytosis -Likely secondary to steroids -Will continue to monitor -CXR negative for infection, no complains of urinary symptoms, currently afebrile  Hyperglycemia/ Diabetes mellitus, Type2 -Metformin Held -Continue home Lantus and NovoLog regimen, insulin sliding scale CBG monitoring -Last hemoglobin A1c in February 2016 7.7 -Hyperglycemic episodes complicated by steroids- will continue to monitor closely  History of CAD -Currently no chest pain -Continue lisinopril, aspirin, statin, Effient, atenolol  OSA -Continue CPAP nightly  Depression -Currently on no medications -Stable  Hypertension -Stable, continue lisinopril, atenolol  Hyperlipidemia -Continue statin, fish oil  Tobacco abuse -Patient counseled on smoking cessation  Code Status: Full  Family Communication: None at bedside  Disposition Plan: Admitted.  Likely discharge 11/24/2014  Time Spent in minutes   30 minutes  Procedures  None  Consults   None  DVT Prophylaxis  Lovenox  Lab Results  Component Value Date   PLT 273 11/23/2014    Medications  Scheduled Meds: . aspirin  81 mg Oral Daily  . atenolol  25 mg Oral Daily  . atorvastatin  80 mg Oral q1800  . azithromycin  500 mg Oral Q2000  . dextromethorphan-guaiFENesin  2 tablet Oral BID  . enoxaparin (LOVENOX) injection  40 mg Subcutaneous Q24H  . insulin aspart  0-5  Units Subcutaneous QHS  . insulin aspart  0-9 Units Subcutaneous TID WC  . insulin aspart  25 Units Subcutaneous TID WC  . insulin glargine  35 Units Subcutaneous BID  . Liraglutide  1.8 mg Subcutaneous Daily  . lisinopril  2.5 mg Oral BID  . methylPREDNISolone (SOLU-MEDROL) injection  40 mg Intravenous Q12H  . mometasone-formoterol  2 puff Inhalation BID  . omega-3 acid ethyl esters  1 g Oral Daily  . prasugrel  10 mg Oral Daily  . sodium chloride  3 mL Intravenous Q12H   Continuous Infusions: . albuterol Stopped (11/22/14 1518)   PRN Meds:.sodium chloride,  acetaminophen **OR** acetaminophen, ipratropium-albuterol, nitroGLYCERIN, sodium chloride, traMADol  Antibiotics   Anti-infectives    Start     Dose/Rate Route Frequency Ordered Stop   11/22/14 2000  azithromycin (ZITHROMAX) tablet 500 mg     500 mg Oral Daily 11/22/14 1835     11/22/14 0000  azithromycin (ZITHROMAX) 250 MG tablet     250 mg Oral Daily 11/22/14 1224          Subjective:   [name] [name] seen and examined today.  Patient feels his breathing has improved.  He denies chest pain or abdominal pain, nausea, vomiting, diarrhea.  He feels that the breathing treatments have helped him.   Objective:   Filed Vitals:   11/22/14 2210 11/22/14 2315 11/23/14 0553 11/23/14 0635  BP:   144/75   Pulse:  72 63   Temp:   98.4 F (36.9 C)   TempSrc:   Oral   Resp:  20 20   Height:      Weight:    118.389 kg (261 lb)  SpO2: 93% 93% 92%     Wt Readings from Last 3 Encounters:  11/23/14 118.389 kg (261 lb)  10/14/14 122.29 kg (269 lb 9.6 oz)  10/06/14 122.018 kg (269 lb)     Intake/Output Summary (Last 24 hours) at 11/23/14 1033 Last data filed at 11/23/14 0914  Gross per 24 hour  Intake    723 ml  Output      0 ml  Net    723 ml    Exam  General: Well developed, well nourished, NAD  HEENT: NCAT, mucous membranes moist.   Cardiovascular: S1 S2 auscultated,RRR  Respiratory: Diminished breath sounds in the bases  Abdomen: Soft, obese, nontender, nondistended, + bowel sounds  Extremities: warm dry without cyanosis clubbing or edema  Neuro: AAOx3, nonfocal  Psych: Pleasant, appropriate mood and affect  Data Review   Micro Results No results found for this or any previous visit (from the past 240 hour(s)).  Radiology Reports Dg Chest 2 View  11/22/2014   CLINICAL DATA:  SOB. Cough and congestion for about a week. Dizziness and lightheaded. Hx HTN. Diabetic. COPD. Smoker.  EXAM: CHEST - 2 VIEW  COMPARISON:  11/07/2013  FINDINGS: Lungs are clear. Chronic  perihilar interstitial prominence . Improvement in the pulmonary vascular congestion since prior study. Heart size and mediastinal contours are within normal limits. No effusion. Minimal spurring in the lower thoracic spine.  IMPRESSION: No acute cardiopulmonary disease.   Electronically Signed   By: Corlis Leak M.D.   On: 11/22/2014 11:44    CBC  Recent Labs Lab 11/22/14 1112 11/23/14 0528  WBC 10.3 15.1*  HGB 16.5 15.5  HCT 46.3 42.8  PLT 279 273  MCV 88.9 88.6  MCH 31.7 32.1  MCHC 35.6 36.2*  RDW 12.3 12.3  LYMPHSABS 2.6  --  MONOABS 0.8  --   EOSABS 0.6  --   BASOSABS 0.1  --     Chemistries   Recent Labs Lab 11/22/14 1112 11/23/14 0528  NA 133* 133*  K 4.5 4.1  CL 102 100  CO2 24 21  GLUCOSE 248* 285*  BUN 10 14  CREATININE 0.81 0.79  CALCIUM 8.3* 8.9  AST 32  --   ALT 31  --   ALKPHOS 86  --   BILITOT 0.9  --    ------------------------------------------------------------------------------------------------------------------ estimated creatinine clearance is 132.5 mL/min (by C-G formula based on Cr of 0.79). ------------------------------------------------------------------------------------------------------------------ No results for input(s): HGBA1C in the last 72 hours. ------------------------------------------------------------------------------------------------------------------ No results for input(s): CHOL, HDL, LDLCALC, TRIG, CHOLHDL, LDLDIRECT in the last 72 hours. ------------------------------------------------------------------------------------------------------------------ No results for input(s): TSH, T4TOTAL, T3FREE, THYROIDAB in the last 72 hours.  Invalid input(s): FREET3 ------------------------------------------------------------------------------------------------------------------ No results for input(s): VITAMINB12, FOLATE, FERRITIN, TIBC, IRON, RETICCTPCT in the last 72 hours.  Coagulation profile No results for input(s): INR,  PROTIME in the last 168 hours.  No results for input(s): DDIMER in the last 72 hours.  Cardiac Enzymes No results for input(s): CKMB, TROPONINI, MYOGLOBIN in the last 168 hours.  Invalid input(s): CK ------------------------------------------------------------------------------------------------------------------ Invalid input(s): POCBNP    Broden Holt D.O. on 11/23/2014 at 10:33 AM  Between 7am to 7pm - Pager - [phone]  After 7pm go to [email or web address] - password [email or web address]  And look for the night coverage person covering for me after hours  [facility] Office  [phone]

## 2014-11-23 NOTE — Discharge Summary (Signed)
Physician Discharge Summary  [name] [name] ZMO:[id number] DOB: Mar 04, 1961 DOA: 11/22/2014  PCP: [name], MD  Admit date: 11/22/2014 Discharge date: 11/24/2014  Time spent: 45 minutes  Recommendations for Outpatient Follow-up:  Patient will be discharged to home. He is to follow-up with his primary care physician within one week of discharge. Patient to continue his medications as prescribed. Patient should follow a heart healthy/carb modified diet. Patient should resume activity as tolerated.  Discharge Diagnoses:  Acute respiratory failure with hypoxia secondary to COPD exacerbation COPD exacerbation secondary to URI Leukocytosis Hyperglycemia/diabetes mellitus, type II History of coronary artery disease Obstructive sleep apnea Depression Hypertension Hyperlipidemia Tobacco abuse  Discharge Condition: Stable  Diet recommendation: Heart healthy/Carb modified  Filed Weights   11/22/14 2000 11/23/14 0635 11/24/14 0521  Weight: 119.296 kg (263 lb) 118.389 kg (261 lb) 118.389 kg (261 lb)    History of present illness:  on 11/22/2014  [name] [name] is a 54 y.o. male with a history of COPD, hypertension, coronary artery disease, presented to the emergency department with complaints of shortness of breath and cough" for approximately 1 week. Patient states his remaining has also had a cough. He denies any recent travel. Patient states nothing seems to make his cough better. Laying down does cause him to be short of breath. Patient states he is unable to walk more than 1 block without becoming short of breath. Patient states he cannot lie flat and uses 2 pillows at night. Patient also uses his CPAP. He does admit to recent cigarette smoking. States he has had a productive cough however cannot remember the color of his sputum. Patient also complains of runny nose and watery eyes. Patient denies any chest pain, abdominal pain, nausea, vomiting, or urinary symptoms. Patient was seen in the  emergency department, chest x-ray showed no acute pulmonary process. Patient was given DuoNeb treatments as well as Solu-Medrol. Upon ambulation, patient desatted into the 80s. [facility] was called for admission.  Hospital Course:  Acute Respiratory failure with hypoxia secondary to COPD exacerbation -In the emergency department, patient had oxygen saturations as low as 87% -CXR: no acute cardiopulmonary disease -Initially placed on azithromycin, DuoNeb nebs, Dulera, antitussives, oxygen to maintain saturations above 92% -Patient currently afebrile -BNP 30 -Will discharge with azithromycin and steroid taper  COPD exacerbation secondary to URI -Treatment and plan as above  Leukocytosis -Likely secondary to steroids -CXR negative for infection, no complains of urinary symptoms, currently afebrile  Hyperglycemia/ Diabetes mellitus, Type2 -Metformin Held, may resume at discharge -Continue home Lantus and NovoLog regimen, insulin sliding scale CBG monitoring -Last hemoglobin A1c in February 2016 7.7 -Hyperglycemic episodes complicated by steroids- will continue to monitor closely  History of CAD -Currently no chest pain -Continue lisinopril, aspirin, statin, Effient, atenolol  OSA -Continue CPAP nightly  Depression -Currently on no medications -Stable  Hypertension -Stable, continue lisinopril, atenolol  Hyperlipidemia -Continue statin, fish oil  Tobacco abuse -Patient counseled on smoking cessation  Procedures  None  Consults  None  Discharge Exam: Filed Vitals:   11/24/14 0521  BP: 111/60  Pulse: 51  Temp: 97.6 F (36.4 C)  Resp: 18   Exam  General: Well developed, well nourished, NAD  HEENT: NCAT, mucous membranes moist.   Cardiovascular: S1 S2 auscultated,RRR  Respiratory: Diminished breath sounds in the bases  Abdomen: Soft, obese, nontender, nondistended, + bowel sounds  Extremities: warm dry without cyanosis clubbing or edema  Neuro: AAOx3,  nonfocal  Psych: Pleasant, appropriate mood and affect  Discharge Instructions  Discharge Instructions    Discharge instructions    Complete by:  As directed   Patient will be discharged to home. He is to follow-up with his primary care physician within one week of discharge. Patient to continue his medications as prescribed. Patient should follow a heart healthy/carb modified diet. Patient should resume activity as tolerated.            Medication List    TAKE these medications        ACCU-CHEK FASTCLIX LANCETS Misc  1 Units by Percutaneous route 4 (four) times daily.     albuterol 108 (90 BASE) MCG/ACT inhaler  Commonly known as:  PROVENTIL HFA;VENTOLIN HFA  Inhale 1-2 puffs into the lungs every 6 (six) hours as needed for wheezing or shortness of breath.     aspirin 81 MG chewable tablet  Chew 1 tablet (81 mg total) by mouth daily.     atenolol 25 MG tablet  Commonly known as:  TENORMIN  Take 1 tablet (25 mg total) by mouth daily.     atorvastatin 80 MG tablet  Commonly known as:  LIPITOR  Take 1 tablet (80 mg total) by mouth daily.     azithromycin 250 MG tablet  Commonly known as:  ZITHROMAX  Take 1 tablet (250 mg total) by mouth daily. Take first 2 tablets together, then 1 every day until finished.     dextromethorphan-guaiFENesin 30-600 MG per 12 hr tablet  Commonly known as:  MUCINEX DM  Take 2 tablets by mouth 2 (two) times daily.     FISH OIL PO  Take 1 capsule by mouth 2 (two) times daily.     insulin aspart 100 UNIT/ML injection  Commonly known as:  novoLOG  Inject 25 Units into the skin 3 (three) times daily with meals.     insulin glargine 100 UNIT/ML injection  Commonly known as:  LANTUS  Inject 0.35 mLs (35 Units total) into the skin 2 (two) times daily.     Insulin Syringe-Needle U-100 31G X 5/16" 1 ML Misc  Commonly known as:  FREESTYLE PRECISION INS SYR  1 Units by Does not apply route as directed. Dx: Diabetes 250.02.  Dispense:  QS  for 5 shots daily - 1 month supply     Liraglutide 18 MG/3ML Sopn  Inject 1.8 mg into the skin daily. Dispense QS for 1 month supply     lisinopril 5 MG tablet  Commonly known as:  PRINIVIL,ZESTRIL  Take 0.5 tablets (2.5 mg total) by mouth 2 (two) times daily.     metFORMIN 1000 MG tablet  Commonly known as:  GLUCOPHAGE  Take 1 tablet (1,000 mg total) by mouth 2 (two) times daily with a meal.     mometasone-formoterol 200-5 MCG/ACT Aero  Commonly known as:  DULERA  Inhale 2 puffs into the lungs 2 (two) times daily. Dispense QS for 1 month supply.     nitroGLYCERIN 0.4 MG SL tablet  Commonly known as:  NITROSTAT  Place 1 tablet (0.4 mg total) under the tongue every 5 (five) minutes x 3 doses as needed for chest pain.     prasugrel 10 MG Tabs tablet  Commonly known as:  EFFIENT  Take 1 tablet (10 mg total) by mouth daily.     predniSONE 10 MG tablet  Commonly known as:  DELTASONE  6,5,4,3,2,1  taper     traMADol 50 MG tablet  Commonly known as:  ULTRAM  Take 50 mg by mouth daily as needed  for severe pain (for pain).       Allergies  Allergen Reactions  . Shellfish Allergy Nausea And Vomiting   Follow-up Information    Follow up with PRATT,TANYA S, MD. Schedule an appointment as soon as possible for a visit in 1 week.   Specialty:  Obstetrics and Gynecology   Why:  Hospital follow-up   Contact information:   [place] [address] [phone]        The results of significant diagnostics from this hospitalization (including imaging, microbiology, ancillary and laboratory) are listed below for reference.    Significant Diagnostic Studies: Dg Chest 2 View  11/22/2014   CLINICAL DATA:  SOB. Cough and congestion for about a week. Dizziness and lightheaded. Hx HTN. Diabetic. COPD. Smoker.  EXAM: CHEST - 2 VIEW  COMPARISON:  11/07/2013  FINDINGS: Lungs are clear. Chronic perihilar interstitial prominence . Improvement in the pulmonary vascular  congestion since prior study. Heart size and mediastinal contours are within normal limits. No effusion. Minimal spurring in the lower thoracic spine.  IMPRESSION: No acute cardiopulmonary disease.   Electronically Signed   By: Lucrezia Europe M.D.   On: 11/22/2014 11:44    Microbiology: No results found for this or any previous visit (from the past 240 hour(s)).   Labs: Basic Metabolic Panel:  Recent Labs Lab 11/22/14 1112 11/23/14 0528 11/24/14 0530  NA 133* 133* 135  K 4.5 4.1 3.9  CL 102 100 103  CO2 24 21 24   GLUCOSE 248* 285* 182*  BUN 10 14 15   CREATININE 0.81 0.79 0.76  CALCIUM 8.3* 8.9 8.4   Liver Function Tests:  Recent Labs Lab 11/22/14 1112  AST 32  ALT 31  ALKPHOS 86  BILITOT 0.9  PROT 6.6  ALBUMIN 3.4*   No results for input(s): LIPASE, AMYLASE in the last 168 hours. No results for input(s): AMMONIA in the last 168 hours. CBC:  Recent Labs Lab 11/22/14 1112 11/23/14 0528 11/24/14 0530  WBC 10.3 15.1* 18.8*  NEUTROABS 6.2  --   --   HGB 16.5 15.5 14.9  HCT 46.3 42.8 41.6  MCV 88.9 88.6 92.0  PLT 279 273 295   Cardiac Enzymes: No results for input(s): CKTOTAL, CKMB, CKMBINDEX, TROPONINI in the last 168 hours. BNP: BNP (last 3 results)  Recent Labs  11/22/14 1115  BNP 30.8    ProBNP (last 3 results) No results for input(s): PROBNP in the last 8760 hours.  CBG:  Recent Labs Lab 11/23/14 0728 11/23/14 1215 11/23/14 1637 11/23/14 2122 11/24/14 0723  GLUCAP 275* 232* 267* 150* 200*       Signed:  Cristal Ford  [facility] 11/24/2014, 8:17 AM

## 2014-11-24 ENCOUNTER — Ambulatory Visit (HOSPITAL_COMMUNITY): Payer: Self-pay

## 2014-11-24 LAB — CBC
HCT: 41.6 % (ref 39.0–52.0)
Hemoglobin: 14.9 g/dL (ref 13.0–17.0)
MCH: 33 pg (ref 26.0–34.0)
MCHC: 35.8 g/dL (ref 30.0–36.0)
MCV: 92 fL (ref 78.0–100.0)
PLATELETS: 295 10*3/uL (ref 150–400)
RBC: 4.52 MIL/uL (ref 4.22–5.81)
RDW: 12.5 % (ref 11.5–15.5)
WBC: 18.8 10*3/uL — AB (ref 4.0–10.5)

## 2014-11-24 LAB — BASIC METABOLIC PANEL
Anion gap: 8 (ref 5–15)
BUN: 15 mg/dL (ref 6–23)
CO2: 24 mmol/L (ref 19–32)
CREATININE: 0.76 mg/dL (ref 0.50–1.35)
Calcium: 8.4 mg/dL (ref 8.4–10.5)
Chloride: 103 mmol/L (ref 96–112)
GFR calc Af Amer: >90 mL/min (ref >90)
Glucose, Bld: 182 mg/dL — ABNORMAL HIGH (ref 70–99)
POTASSIUM: 3.9 mmol/L (ref 3.5–5.1)
SODIUM: 135 mmol/L (ref 135–145)

## 2014-11-24 LAB — GLUCOSE, CAPILLARY: Glucose-Capillary: 200 mg/dL — ABNORMAL HIGH (ref 70–99)

## 2014-11-24 NOTE — Progress Notes (Signed)
Pt discharged home.  Discharge instructions with prescriptions given.   Pt verbalized understanding.

## 2014-11-24 NOTE — Progress Notes (Signed)
Report from [facility], agree with assessment. ABC's intact.

## 2014-11-26 ENCOUNTER — Ambulatory Visit (HOSPITAL_COMMUNITY): Payer: Self-pay

## 2014-12-01 ENCOUNTER — Ambulatory Visit (HOSPITAL_COMMUNITY): Payer: Self-pay

## 2014-12-02 NOTE — Progress Notes (Unsigned)
This encounter was created in error - please disregard.

## 2014-12-03 ENCOUNTER — Ambulatory Visit (HOSPITAL_COMMUNITY): Payer: Self-pay

## 2014-12-08 ENCOUNTER — Ambulatory Visit: Payer: Self-pay

## 2014-12-08 ENCOUNTER — Ambulatory Visit (HOSPITAL_COMMUNITY): Payer: Self-pay

## 2014-12-08 ENCOUNTER — Telehealth: Payer: Self-pay

## 2014-12-08 NOTE — Telephone Encounter (Signed)
Spoke to patient advised samples of effient left at front desk of [address] office.

## 2014-12-08 NOTE — Telephone Encounter (Signed)
[employer] called about him picking up his effeint meds from the company

## 2014-12-10 ENCOUNTER — Ambulatory Visit (HOSPITAL_COMMUNITY): Payer: Self-pay

## 2014-12-10 ENCOUNTER — Ambulatory Visit (INDEPENDENT_AMBULATORY_CARE_PROVIDER_SITE_OTHER): Payer: Self-pay | Admitting: Family Medicine

## 2014-12-10 ENCOUNTER — Encounter: Payer: Self-pay | Admitting: Family Medicine

## 2014-12-10 VITALS — BP 113/67 | HR 72 | Temp 98.3°F | Ht 68.0 in | Wt 263.0 lb

## 2014-12-10 DIAGNOSIS — I10 Essential (primary) hypertension: Secondary | ICD-10-CM

## 2014-12-10 DIAGNOSIS — E119 Type 2 diabetes mellitus without complications: Secondary | ICD-10-CM

## 2014-12-10 DIAGNOSIS — I214 Non-ST elevation (NSTEMI) myocardial infarction: Secondary | ICD-10-CM

## 2014-12-10 DIAGNOSIS — Z720 Tobacco use: Secondary | ICD-10-CM

## 2014-12-10 DIAGNOSIS — H9193 Unspecified hearing loss, bilateral: Secondary | ICD-10-CM

## 2014-12-10 DIAGNOSIS — R0602 Shortness of breath: Secondary | ICD-10-CM

## 2014-12-10 DIAGNOSIS — Z9861 Coronary angioplasty status: Secondary | ICD-10-CM

## 2014-12-10 DIAGNOSIS — H9190 Unspecified hearing loss, unspecified ear: Secondary | ICD-10-CM | POA: Insufficient documentation

## 2014-12-10 DIAGNOSIS — I2510 Atherosclerotic heart disease of native coronary artery without angina pectoris: Secondary | ICD-10-CM

## 2014-12-10 DIAGNOSIS — J441 Chronic obstructive pulmonary disease with (acute) exacerbation: Secondary | ICD-10-CM

## 2014-12-10 MED ORDER — ATENOLOL 25 MG PO TABS: 25.0000 mg | ORAL_TABLET | Freq: Every day | ORAL | Status: DC

## 2014-12-10 MED ORDER — MOMETASONE FURO-FORMOTEROL FUM 200-5 MCG/ACT IN AERO: 2.0000 | INHALATION_SPRAY | Freq: Once | RESPIRATORY_TRACT | Status: DC

## 2014-12-10 MED ORDER — MOMETASONE FURO-FORMOTEROL FUM 200-5 MCG/ACT IN AERO: 2.0000 | INHALATION_SPRAY | Freq: Two times a day (BID) | RESPIRATORY_TRACT | Status: DC

## 2014-12-10 MED ORDER — NITROGLYCERIN 0.4 MG SL SUBL: 0.4000 mg | SUBLINGUAL_TABLET | SUBLINGUAL | Status: DC | PRN

## 2014-12-10 NOTE — Assessment & Plan Note (Signed)
Continue to work on quitting smoking--encouraged today

## 2014-12-10 NOTE — Patient Instructions (Signed)

## 2014-12-10 NOTE — Assessment & Plan Note (Signed)
Refilled his nitroglycerine

## 2014-12-10 NOTE — Progress Notes (Signed)
    Subjective:    Patient ID: [name] [name] is a 54 y.o. male presenting with Hospitalization Follow-up  on 12/10/2014  HPI: Here for hospital f/u.  Admitted with bronchitis and emphysema.  Has quit smoking x 6 days. Reports breathing feels much better. Reports needing ear lavage due to poor hearing.   Review of Systems  Constitutional: Negative for fever and chills.  HENT: Positive for hearing loss.   Respiratory: Negative for cough and shortness of breath.   Cardiovascular: Negative for leg swelling.  Gastrointestinal: Negative for nausea, vomiting and abdominal pain.  Musculoskeletal: Positive for arthralgias.      Objective:    BP 113/67 mmHg  Pulse 72  Temp(Src) 98.3 F (36.8 C) (Oral)  Ht 5\' 8"  (1.727 m)  Wt 263 lb (119.296 kg)  BMI 40.00 kg/m2 Physical Exam  Constitutional: He appears well-developed and well-nourished. No distress.  HENT:  Head: Normocephalic and atraumatic.  There is white scarring on both ear drums.  Eyes: No scleral icterus.  Neck: Neck supple.  Cardiovascular: Normal rate.   Pulmonary/Chest: Effort normal.  Abdominal: Soft.  Musculoskeletal: He exhibits no edema.  Neurological: He is alert.  Skin: Skin is warm.  Psychiatric: He has a normal mood and affect.  Vitals reviewed.       Assessment & Plan:   Problem List Items Addressed This Visit      High   H/O NSTEMI (non-ST elevated myocardial infarction) (Chronic)    Refilled his nitroglycerine      Relevant Medications   nitroGLYCERIN (NITROSTAT) SL tablet   atenolol (TENORMIN) tablet     Medium   Essential hypertension (Chronic)    BP at goal--continue current regimen--medication update.      Relevant Medications   nitroGLYCERIN (NITROSTAT) SL tablet   atenolol (TENORMIN) tablet   CAD S/P percutaneous coronary angioplasty (Chronic)   Relevant Medications   nitroGLYCERIN (NITROSTAT) SL tablet   atenolol (TENORMIN) tablet   Tobacco use (Chronic)    Continue to work on  quitting smoking--encouraged today      DM type 2 (diabetes mellitus, type 2)    CBG's reviewed in machine--pt. Checked while here.  CBG was 160.      Relevant Orders   Microalbumin/Creatinine Ratio, Urine     Unprioritized   SOB (shortness of breath) on exertion   Relevant Medications   mometasone-formoterol (DULERA) 200-5 MCG/ACT AERO   mometasone-formoterol (DULERA) 200-5 MCG/ACT AERO   COPD exacerbation - Primary    Improved with Dulera--needs formal PFT's.      Relevant Medications   mometasone-formoterol (DULERA) 200-5 MCG/ACT AERO   mometasone-formoterol (DULERA) 200-5 MCG/ACT AERO   Other Relevant Orders   Pulmonary function test   Hearing difficulty       Return in about 3 months (around 03/12/2015).

## 2014-12-10 NOTE — Assessment & Plan Note (Signed)
CBG's reviewed in machine--pt. Checked while here.  CBG was 160.

## 2014-12-10 NOTE — Assessment & Plan Note (Signed)
Improved with Dulera--needs formal PFT's.

## 2014-12-10 NOTE — Assessment & Plan Note (Signed)
BP at goal--continue current regimen--medication update.

## 2014-12-11 LAB — MICROALBUMIN / CREATININE URINE RATIO
Creatinine, Urine: 179 mg/dL
MICROALB UR: 2.2 mg/dL — AB (ref <2.0)
Microalb Creat Ratio: 12.3 mg/g (ref 0.0–30.0)

## 2014-12-13 ENCOUNTER — Encounter (HOSPITAL_COMMUNITY): Payer: Self-pay | Admitting: Emergency Medicine

## 2014-12-13 ENCOUNTER — Emergency Department (HOSPITAL_COMMUNITY)
Admission: EM | Admit: 2014-12-13 | Discharge: 2014-12-13 | Disposition: A | Discharge status: Home / Self Care | Payer: Medicaid Other | Attending: Emergency Medicine | Admitting: Emergency Medicine

## 2014-12-13 ENCOUNTER — Emergency Department (HOSPITAL_COMMUNITY): Payer: Medicaid Other

## 2014-12-13 DIAGNOSIS — Y9389 Activity, other specified: Secondary | ICD-10-CM | POA: Insufficient documentation

## 2014-12-13 DIAGNOSIS — Z7982 Long term (current) use of aspirin: Secondary | ICD-10-CM | POA: Diagnosis not present

## 2014-12-13 DIAGNOSIS — I2510 Atherosclerotic heart disease of native coronary artery without angina pectoris: Secondary | ICD-10-CM | POA: Diagnosis not present

## 2014-12-13 DIAGNOSIS — Z792 Long term (current) use of antibiotics: Secondary | ICD-10-CM | POA: Insufficient documentation

## 2014-12-13 DIAGNOSIS — Z9889 Other specified postprocedural states: Secondary | ICD-10-CM | POA: Insufficient documentation

## 2014-12-13 DIAGNOSIS — Z720 Tobacco use: Secondary | ICD-10-CM | POA: Diagnosis not present

## 2014-12-13 DIAGNOSIS — Z794 Long term (current) use of insulin: Secondary | ICD-10-CM | POA: Diagnosis not present

## 2014-12-13 DIAGNOSIS — Y9289 Other specified places as the place of occurrence of the external cause: Secondary | ICD-10-CM | POA: Insufficient documentation

## 2014-12-13 DIAGNOSIS — Y998 Other external cause status: Secondary | ICD-10-CM | POA: Insufficient documentation

## 2014-12-13 DIAGNOSIS — G4733 Obstructive sleep apnea (adult) (pediatric): Secondary | ICD-10-CM | POA: Diagnosis not present

## 2014-12-13 DIAGNOSIS — F329 Major depressive disorder, single episode, unspecified: Secondary | ICD-10-CM | POA: Insufficient documentation

## 2014-12-13 DIAGNOSIS — I10 Essential (primary) hypertension: Secondary | ICD-10-CM | POA: Diagnosis not present

## 2014-12-13 DIAGNOSIS — H7292 Unspecified perforation of tympanic membrane, left ear: Secondary | ICD-10-CM | POA: Diagnosis not present

## 2014-12-13 DIAGNOSIS — Z9861 Coronary angioplasty status: Secondary | ICD-10-CM | POA: Insufficient documentation

## 2014-12-13 DIAGNOSIS — G43909 Migraine, unspecified, not intractable, without status migrainosus: Secondary | ICD-10-CM | POA: Insufficient documentation

## 2014-12-13 DIAGNOSIS — Z9981 Dependence on supplemental oxygen: Secondary | ICD-10-CM | POA: Insufficient documentation

## 2014-12-13 DIAGNOSIS — G8929 Other chronic pain: Secondary | ICD-10-CM | POA: Insufficient documentation

## 2014-12-13 DIAGNOSIS — I252 Old myocardial infarction: Secondary | ICD-10-CM | POA: Diagnosis not present

## 2014-12-13 DIAGNOSIS — E785 Hyperlipidemia, unspecified: Secondary | ICD-10-CM | POA: Diagnosis not present

## 2014-12-13 DIAGNOSIS — J449 Chronic obstructive pulmonary disease, unspecified: Secondary | ICD-10-CM | POA: Insufficient documentation

## 2014-12-13 DIAGNOSIS — Z8719 Personal history of other diseases of the digestive system: Secondary | ICD-10-CM | POA: Diagnosis not present

## 2014-12-13 DIAGNOSIS — S199XXA Unspecified injury of neck, initial encounter: Secondary | ICD-10-CM | POA: Insufficient documentation

## 2014-12-13 DIAGNOSIS — Z7952 Long term (current) use of systemic steroids: Secondary | ICD-10-CM | POA: Insufficient documentation

## 2014-12-13 DIAGNOSIS — E119 Type 2 diabetes mellitus without complications: Secondary | ICD-10-CM | POA: Insufficient documentation

## 2014-12-13 DIAGNOSIS — M1990 Unspecified osteoarthritis, unspecified site: Secondary | ICD-10-CM | POA: Insufficient documentation

## 2014-12-13 DIAGNOSIS — S09302A Unspecified injury of left middle and inner ear, initial encounter: Secondary | ICD-10-CM | POA: Insufficient documentation

## 2014-12-13 MED ORDER — OXYCODONE-ACETAMINOPHEN 5-325 MG PO TABS
2.0000 | ORAL_TABLET | Freq: Once | ORAL | Status: AC
  Administered 2014-12-13: 2 via ORAL
  Filled 2014-12-13: qty 2

## 2014-12-13 MED ORDER — TETANUS-DIPHTH-ACELL PERTUSSIS 5-2.5-18.5 LF-MCG/0.5 IM SUSP: 0.5000 mL | Freq: Once | INTRAMUSCULAR | Status: DC

## 2014-12-13 MED ORDER — TRAMADOL HCL 50 MG PO TABS: 50.0000 mg | ORAL_TABLET | Freq: Four times a day (QID) | ORAL | Status: DC | PRN

## 2014-12-13 NOTE — ED Notes (Signed)
MD Kohut at bedside. 
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
## 2014-12-13 NOTE — ED Notes (Signed)
Pt returned from CT °
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
## 2014-12-13 NOTE — Discharge Instructions (Signed)
Eardrum Perforation °The eardrum is a thin, round tissue inside the ear that separates the ear canal from the middle ear. This is the tissue that detects sound and enables you to hear. The eardrum can be punctured or torn (perforated). Eardrums generally heal without help and with little or no permanent hearing loss. °CAUSES  °· Sudden pressure changes that happen in situations like scuba diving or flying in an airplane. °· Foreign objects in the ear. °· Inserting a cotton-tipped swab in the ear. °· Loud noise. °· Trauma to the ear. °SYMPTOMS  °· Hearing loss. °· Ear pain. °· Ringing in the ears. °· Discharge or bleeding from the ear. °· Dizziness. °· Vomiting. °· Facial paralysis. °HOME CARE INSTRUCTIONS  °· Keep your ear dry, as this improves healing. Swimming, diving, and showers are not allowed until healing is complete. While bathing, protect the ear by placing a piece of cotton covered with petroleum jelly in the outer ear canal. °· Only take over-the-counter or prescription medicines for pain, discomfort, or fever as directed by your caregiver. °· Blow your nose gently. Forceful blowing increases the pressure in the middle ear and may cause further injury or delay healing. °· Resume normal activities, such as showering, when the perforation has healed. Your caregiver can let you know when this has occurred. °· Talk to your caregiver before flying on an airplane. Air travel is generally allowed with a perforated eardrum. °· If your caregiver has given you a follow-up appointment, it is very important to keep that appointment. Failure to keep the appointment could result in a chronic or permanent injury, pain, hearing loss, and disability. °SEEK IMMEDIATE MEDICAL CARE IF:  °· You have bleeding or pus coming from your ear. °· You have problems with balance, dizziness, nausea, or vomiting. °· You develop increased pain. °· You have a fever. °MAKE SURE YOU:  °· Understand these instructions. °· Will watch your  condition. °· Will get help right away if you are not doing well or get worse. °Document Released: 08/26/2000 Document Revised: 11/21/2011 Document Reviewed: 08/28/2008 °[employer]® Patient Information ©2015 [employer]. This information is not intended to replace advice given to you by your health care provider. Make sure you discuss any questions you have with your health care provider. ° °

## 2014-12-15 ENCOUNTER — Ambulatory Visit (HOSPITAL_COMMUNITY): Payer: Self-pay

## 2014-12-17 ENCOUNTER — Ambulatory Visit (HOSPITAL_COMMUNITY): Payer: Self-pay

## 2014-12-22 ENCOUNTER — Ambulatory Visit (HOSPITAL_COMMUNITY): Payer: Self-pay
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
## 2014-12-24 ENCOUNTER — Ambulatory Visit (HOSPITAL_COMMUNITY): Payer: Self-pay

## 2014-12-25 ENCOUNTER — Encounter: Payer: Self-pay | Admitting: Pharmacist

## 2014-12-25 ENCOUNTER — Ambulatory Visit (INDEPENDENT_AMBULATORY_CARE_PROVIDER_SITE_OTHER): Payer: Self-pay | Admitting: Pharmacist

## 2014-12-25 VITALS — BP 111/74 | HR 70 | Ht 66.93 in | Wt 269.6 lb

## 2014-12-25 DIAGNOSIS — E669 Obesity, unspecified: Secondary | ICD-10-CM

## 2014-12-25 DIAGNOSIS — E785 Hyperlipidemia, unspecified: Secondary | ICD-10-CM

## 2014-12-25 DIAGNOSIS — E119 Type 2 diabetes mellitus without complications: Secondary | ICD-10-CM

## 2014-12-25 DIAGNOSIS — E1159 Type 2 diabetes mellitus with other circulatory complications: Secondary | ICD-10-CM

## 2014-12-25 DIAGNOSIS — J441 Chronic obstructive pulmonary disease with (acute) exacerbation: Secondary | ICD-10-CM

## 2014-12-25 DIAGNOSIS — R0602 Shortness of breath: Secondary | ICD-10-CM

## 2014-12-25 DIAGNOSIS — Z720 Tobacco use: Secondary | ICD-10-CM

## 2014-12-25 DIAGNOSIS — I10 Essential (primary) hypertension: Secondary | ICD-10-CM

## 2014-12-25 MED ORDER — ATORVASTATIN CALCIUM 80 MG PO TABS: 80.0000 mg | ORAL_TABLET | Freq: Every day | ORAL | Status: DC

## 2014-12-25 MED ORDER — ROSUVASTATIN CALCIUM 20 MG PO TABS: 20.0000 mg | ORAL_TABLET | Freq: Every day | ORAL | Status: DC

## 2014-12-25 MED ORDER — METFORMIN HCL 1000 MG PO TABS: 1000.0000 mg | ORAL_TABLET | Freq: Two times a day (BID) | ORAL | Status: DC

## 2014-12-25 MED ORDER — MOMETASONE FURO-FORMOTEROL FUM 200-5 MCG/ACT IN AERO: 2.0000 | INHALATION_SPRAY | Freq: Two times a day (BID) | RESPIRATORY_TRACT | Status: DC

## 2014-12-25 MED ORDER — CARVEDILOL 3.125 MG PO TABS
3.1250 mg | ORAL_TABLET | Freq: Two times a day (BID) | ORAL | Status: DC
Start: 2014-12-25 — End: 2015-10-27

## 2014-12-25 MED ORDER — MOMETASONE FURO-FORMOTEROL FUM 200-5 MCG/ACT IN AERO
2.0000 | INHALATION_SPRAY | Freq: Two times a day (BID) | RESPIRATORY_TRACT | Status: DC
Start: 2014-12-25 — End: 2015-08-20

## 2014-12-25 NOTE — Assessment & Plan Note (Signed)
Longstanding Diabetes currently controlled with minimal low readings.   Majority of readings in goal range. Continue current DM meds: insulin aspart 25 units TID with meals, insulin glargine 35 units BID, liraglutide 1.8mg  daily, metformin 1000mg  BID.

## 2014-12-25 NOTE — Assessment & Plan Note (Addendum)
Chronic tobacco abuse in patient who has recently abstained for 8 days from tobacco.  Patient understands triggers to smoking include drinking alcohol. Goal is to stay away from alcohol (refrain from buying beer) so he will not be encouraged to smoke.   Goal to quit for another week, taking one day at a time.   COPD currently controlled, bronchitis completed resolved and breathing improved with tobacco cessation for the last week.  Continue current COPD meds: albuterol 1-2 puffs q6hrs PRN, Dulera 200/5mg  2 puffs BID, provided one sample Dulera  inhaler today.

## 2014-12-25 NOTE — Assessment & Plan Note (Signed)
Obesity. Patient has a goal to get to 255 lbs by July. He understands to eat less, not eat before going to bed, and exercising more to get to this goal.

## 2014-12-25 NOTE — Assessment & Plan Note (Signed)
Chronic tobacco abuse in patient who has recently abstained for 8 days from tobacco.  Patient understands triggers to smoking include drinking alcohol. Goal is to stay away from alcohol (refrain from buying beer) so he will not be encouraged to smoke.   Goal to quit for another week, taking one day at a time.

## 2014-12-25 NOTE — Progress Notes (Signed)
Patient ID: [name] [name], male   DOB: 1960-12-13, 54 y.o.   MRN: [id number] Reviewed: agree with Dr. Graylin Shiver documentation and management.

## 2014-12-25 NOTE — Progress Notes (Signed)
S:  Patient arrives in good spirits today ambulating without assistance.  Patient arrives for evaluation/assistance with tobacco dependence and diabetes medication evaluation.  Patient claims complete tobacco abstinence for the last 8 days.  Pt has a 37+ year history of smoking 0.5 ppd.   Patient is severely obese (BMI 42.3) and has been ranging between 261-275 lbs this year.  He is willing to work on weight loss in the near future.   Patient has a history of using crack cocaine,  although denies any use of drugs currently and reports he will abstain from cocaine use in the future.   Patients DM is currently well controlled.  COPD has improved since adding Dulera- pt showed proper inhalation technique. Bronchitis and emphysema has been resolved.   O: Fasting CBG this AM was 155; Log shows lowest CBG has been is 64 but this was due to skipping a meal. A1c 7.7 (10/14/2014)   A/P: Chronic tobacco abuse in patient who has recently abstained for 8 days from tobacco.  Patient understands triggers to smoking include drinking alcohol. Goal is to stay away from alcohol (refrain from buying beer) so he will not be encouraged to smoke.   Goal to quit for another week, taking one day at a time.   Obesity. Patient has a goal to get to 255 lbs by July. He understands to eat less, not eat before going to bed, and exercising more to get to this goal.  Known CAD on Beta-Blocker therapy in patient who admits to past use of Crack Cocaine.   Advised to avoid use in the future.   Patient verablized understanding of instruction to avoid Crack Cocaine.   Patient was counseled on risks of using crack with beta blockers switched atenolol 25mg  daily to carvedilol 3.125mg  BID.  Reassess BP and pulse next visit.   Longstanding Diabetes currently controlled with minimal low readings.   Majority of readings in goal range. Continue current DM meds: insulin aspart 25 units TID with meals, insulin glargine 35 units BID,  liraglutide 1.8mg  daily, metformin 1000mg  BID.   COPD currently controlled, bronchitis completed resolved and breathing improved with tobacco cessation for the last week.  Continue current COPD meds: albuterol 1-2 puffs q6hrs PRN, Dulera 200/5mg  2 puffs BID, provided one sample Dulera  inhaler today.    F/U Rx Clinic Visit   Total time in face-to-face counseling 35 minutes.  Patient seen with Eunice Blase, Pharm.D. Candidate

## 2014-12-25 NOTE — Patient Instructions (Addendum)
It was pleasant to see you today!!  Continue to not smoke! You are doing great.  Stop taking atenolol. Start taking carvedilol (Coreg) 3.125mg  two times a day.   Remember to eat less, try not to eat before going to bed, and exercise more.   Please call in a couple weeks to set up follow up appointment in May.

## 2014-12-29 ENCOUNTER — Ambulatory Visit (HOSPITAL_COMMUNITY): Payer: Self-pay

## 2014-12-31 ENCOUNTER — Ambulatory Visit (HOSPITAL_COMMUNITY): Payer: Self-pay

## 2014-12-31 ENCOUNTER — Other Ambulatory Visit: Payer: Self-pay | Admitting: *Deleted

## 2014-12-31 DIAGNOSIS — E1159 Type 2 diabetes mellitus with other circulatory complications: Secondary | ICD-10-CM

## 2014-12-31 MED ORDER — INSULIN GLARGINE 100 UNIT/ML ~~LOC~~ SOLN: 35.0000 [IU] | Freq: Two times a day (BID) | SUBCUTANEOUS | Status: DC

## 2015-01-05 ENCOUNTER — Ambulatory Visit (HOSPITAL_COMMUNITY): Payer: Self-pay

## 2015-01-07 ENCOUNTER — Ambulatory Visit (HOSPITAL_COMMUNITY): Payer: Self-pay

## 2015-01-12 ENCOUNTER — Ambulatory Visit (HOSPITAL_COMMUNITY): Payer: Self-pay

## 2015-01-14 ENCOUNTER — Ambulatory Visit (HOSPITAL_COMMUNITY): Payer: Self-pay

## 2015-01-19 ENCOUNTER — Ambulatory Visit (HOSPITAL_COMMUNITY): Payer: Self-pay

## 2015-01-21 ENCOUNTER — Ambulatory Visit (HOSPITAL_COMMUNITY): Payer: Self-pay

## 2015-01-26 ENCOUNTER — Ambulatory Visit (HOSPITAL_COMMUNITY): Payer: Self-pay

## 2015-01-27 ENCOUNTER — Other Ambulatory Visit: Payer: Self-pay | Admitting: *Deleted

## 2015-01-27 MED ORDER — NITROGLYCERIN 0.4 MG SL SUBL: 0.4000 mg | SUBLINGUAL_TABLET | SUBLINGUAL | Status: DC | PRN

## 2015-01-27 NOTE — Telephone Encounter (Signed)
Per MAP pt reported only having 10 tablets left from Rx sent in on 11/13/14 #100 nitrostat.  Pt is requesting another refill.  Pt has 3 refills left, but just want to make sure ok to dispense so soon after initial fill.  Left voice message for patient to call nurse regarding refill make sure he was ok and only taking the medication as needed for chest pain.  Derl Barrow, RN

## 2015-01-28 ENCOUNTER — Ambulatory Visit (HOSPITAL_COMMUNITY): Payer: Self-pay

## 2015-02-02 ENCOUNTER — Ambulatory Visit (HOSPITAL_COMMUNITY): Payer: Self-pay

## 2015-02-13 ENCOUNTER — Encounter: Payer: Self-pay | Admitting: Family Medicine

## 2015-02-13 ENCOUNTER — Ambulatory Visit (INDEPENDENT_AMBULATORY_CARE_PROVIDER_SITE_OTHER): Payer: Self-pay | Admitting: Family Medicine

## 2015-02-13 VITALS — BP 120/63 | HR 76 | Temp 98.2°F | Ht 67.0 in | Wt 264.2 lb

## 2015-02-13 DIAGNOSIS — I10 Essential (primary) hypertension: Secondary | ICD-10-CM

## 2015-02-13 DIAGNOSIS — I214 Non-ST elevation (NSTEMI) myocardial infarction: Secondary | ICD-10-CM

## 2015-02-13 DIAGNOSIS — E785 Hyperlipidemia, unspecified: Secondary | ICD-10-CM

## 2015-02-13 DIAGNOSIS — E119 Type 2 diabetes mellitus without complications: Secondary | ICD-10-CM

## 2015-02-13 LAB — POCT GLYCOSYLATED HEMOGLOBIN (HGB A1C): Hemoglobin A1C: 7.8

## 2015-02-13 MED ORDER — NITROGLYCERIN 0.4 MG SL SUBL: 0.4000 mg | SUBLINGUAL_TABLET | SUBLINGUAL | Status: DC | PRN

## 2015-02-13 MED ORDER — ROSUVASTATIN CALCIUM 20 MG PO TABS: 20.0000 mg | ORAL_TABLET | Freq: Every day | ORAL | Status: DC

## 2015-02-13 NOTE — Assessment & Plan Note (Signed)
Refilled Nitroglycerine

## 2015-02-13 NOTE — Assessment & Plan Note (Signed)
BP well controlled-continue Lisinopril.

## 2015-02-13 NOTE — Progress Notes (Addendum)
   Subjective:    Patient ID: [name] [name] is a 54 y.o. male presenting with Follow-up  on 02/13/2015  HPI: Seen today for multiple complaints. 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 Problem 3 h/o AMI Needs refill of his Nitroglycerin. Leaving town for 1 month and will not have enough meds.  No chest pain at present. Still smoking some.  Problem 4 HTN BP follow up Problem 5 DM Diabetes f/u taking meds as directed. Watching diet.   Review of Systems  Constitutional: Negative for fever and chills.  Respiratory: Negative for shortness of breath.   Cardiovascular: Negative for leg swelling.  Gastrointestinal: Negative for nausea, vomiting and abdominal pain.      Objective:    BP 120/63 mmHg  Pulse 76  Temp(Src) 98.2 F (36.8 C) (Oral)  Ht 5\' 7"  (1.702 m)  Wt 264 lb 4 oz (119.863 kg)  BMI 41.38 kg/m2 Physical Exam  Constitutional: He appears well-developed and well-nourished. No distress.  HENT:  Head: Normocephalic and atraumatic.  Eyes: No scleral icterus.  Neck: Neck supple.  Cardiovascular: Normal rate.   Pulmonary/Chest: Effort normal.  Abdominal: Soft.  Musculoskeletal: He exhibits no edema.  Reports back pain which is unchanged.  Neurological: He is alert.  Skin: Skin is warm.  Psychiatric: He has a normal mood and affect.  Vitals reviewed.   HgbA1C is 7.8    Assessment & Plan:   Problem List Items Addressed This Visit      High   H/O NSTEMI (non-ST elevated myocardial infarction) (Chronic)    Refilled Nitroglycerine      Relevant Medications   rosuvastatin (CRESTOR) 20 MG tablet   nitroGLYCERIN (NITROSTAT) 0.4 MG SL tablet     Medium   Essential hypertension (Chronic)    BP well controlled-continue Lisinopril.      Relevant Medications   rosuvastatin (CRESTOR) 20 MG tablet   nitroGLYCERIN  (NITROSTAT) 0.4 MG SL tablet   Hyperlipidemia with target LDL less than 70 (Chronic)    Continue Crestor--med refill given      Relevant Medications   rosuvastatin (CRESTOR) 20 MG tablet   nitroGLYCERIN (NITROSTAT) 0.4 MG SL tablet   DM type 2 (diabetes mellitus, type 2) - Primary    Hgb A1C is down to < 8-continue current regimen.      Relevant Medications   rosuvastatin (CRESTOR) 20 MG tablet   Other Relevant Orders   POCT A1C (Completed)     Smoking cessation encouraged.  Kalene Cutler S 02/13/2015 11:58 AM

## 2015-02-13 NOTE — Assessment & Plan Note (Signed)
Hgb A1C is down to < 8-continue current regimen.

## 2015-02-13 NOTE — Patient Instructions (Signed)
Smoking Cessation Quitting smoking is important to your health and has many advantages. However, it is not always easy to quit since nicotine is a very addictive drug. Oftentimes, people try 3 times or more before being able to quit. This document explains the best ways for you to prepare to quit smoking. Quitting takes hard work and a lot of effort, but you can do it. ADVANTAGES OF QUITTING SMOKING  You will live longer, feel better, and live better.  Your body will feel the impact of quitting smoking almost immediately.  Within 20 minutes, blood pressure decreases. Your pulse returns to its normal level.  After 8 hours, carbon monoxide levels in the blood return to normal. Your oxygen level increases.  After 24 hours, the chance of having a heart attack starts to decrease. Your breath, hair, and body stop smelling like smoke.  After 48 hours, damaged nerve endings begin to recover. Your sense of taste and smell improve.  After 72 hours, the body is virtually free of nicotine. Your bronchial tubes relax and breathing becomes easier.  After 2 to 12 weeks, lungs can hold more air. Exercise becomes easier and circulation improves.  The risk of having a heart attack, stroke, cancer, or lung disease is greatly reduced.  After 1 year, the risk of coronary heart disease is cut in half.  After 5 years, the risk of stroke falls to the same as a nonsmoker.  After 10 years, the risk of lung cancer is cut in half and the risk of other cancers decreases significantly.  After 15 years, the risk of coronary heart disease drops, usually to the level of a nonsmoker.  If you are pregnant, quitting smoking will improve your chances of having a healthy baby.  The people you live with, especially any children, will be healthier.  You will have extra money to spend on things other than cigarettes. QUESTIONS TO THINK ABOUT BEFORE ATTEMPTING TO QUIT You may want to talk about your answers with your  health care provider.  Why do you want to quit?  If you tried to quit in the past, what helped and what did not?  What will be the most difficult situations for you after you quit? How will you plan to handle them?  Who can help you through the tough times? Your family? Friends? A health care provider?  What pleasures do you get from smoking? What ways can you still get pleasure if you quit? Here are some questions to ask your health care provider:  How can you help me to be successful at quitting?  What medicine do you think would be best for me and how should I take it?  What should I do if I need more help?  What is smoking withdrawal like? How can I get information on withdrawal? GET READY  Set a quit date.  Change your environment by getting rid of all cigarettes, ashtrays, matches, and lighters in your home, car, or work. Do not let people smoke in your home.  Review your past attempts to quit. Think about what worked and what did not. GET SUPPORT AND ENCOURAGEMENT You have a better chance of being successful if you have help. You can get support in many ways.  Tell your family, friends, and coworkers that you are going to quit and need their support. Ask them not to smoke around you.  Get individual, group, or telephone counseling and support. Programs are available at local hospitals and health centers. Call   your local health department for information about programs in your area.  Spiritual beliefs and practices may help some smokers quit.  Download a "quit meter" on your computer to keep track of quit statistics, such as how long you have gone without smoking, cigarettes not smoked, and money saved.  Get a self-help book about quitting smoking and staying off tobacco. [place] yourself from urges to smoke. Talk to someone, go for a walk, or occupy your time with a task.  Change your normal routine. Take a different route to work.  Drink tea instead of coffee. Eat breakfast in a different place.  Reduce your stress. Take a hot bath, exercise, or read a book.  Plan something enjoyable to do every day. Reward yourself for not smoking.  Explore interactive web-based programs that specialize in helping you quit. GET MEDICINE AND USE IT CORRECTLY Medicines can help you stop smoking and decrease the urge to smoke. Combining medicine with the above behavioral methods and support can greatly increase your chances of successfully quitting smoking.  Nicotine replacement therapy helps deliver nicotine to your body without the negative effects and risks of smoking. Nicotine replacement therapy includes nicotine gum, lozenges, inhalers, nasal sprays, and skin patches. Some may be available over-the-counter and others require a prescription.  Antidepressant medicine helps people abstain from smoking, but how this works is unknown. This medicine is available by prescription.  Nicotinic receptor partial agonist medicine simulates the effect of nicotine in your brain. This medicine is available by prescription. Ask your health care provider for advice about which medicines to use and how to use them based on your health history. Your health care provider will tell you what side effects to look out for if you choose to be on a medicine or therapy. Carefully read the information on the package. Do not use any other product containing nicotine while using a nicotine replacement product.  RELAPSE OR DIFFICULT SITUATIONS Most relapses occur within the first 3 months after quitting. Do not be discouraged if you start smoking again. Remember, most people try several times before finally quitting. You may have symptoms of withdrawal because your body is used to nicotine. You may crave cigarettes, be irritable, feel very hungry, cough often, get headaches, or have difficulty concentrating. The withdrawal symptoms are only temporary. They are strongest  when you first quit, but they will go away within 10-14 days. To reduce the chances of relapse, try to:  Avoid drinking alcohol. Drinking lowers your chances of successfully quitting.  Reduce the amount of caffeine you consume. Once you quit smoking, the amount of caffeine in your body increases and can give you symptoms, such as a rapid heartbeat, sweating, and anxiety.  Avoid smokers because they can make you want to smoke.  Do not let weight gain distract you. Many smokers will gain weight when they quit, usually less than 10 pounds. Eat a healthy diet and stay active. You can always lose the weight gained after you quit.  Find ways to improve your mood other than smoking. FOR MORE INFORMATION  www.smokefree.gov  Document Released: 08/23/2001 Document Revised: 01/13/2014 Document Reviewed: 12/08/2011 [facility] Patient Information 2015 [place], [place]. This information is not intended to replace advice given to you by your health care provider. Make sure you discuss any questions you have with your health care provider. Blood Glucose Monitoring Monitoring your blood glucose (also know as blood sugar) helps you to manage your diabetes. It also helps you  and your health care provider monitor your diabetes and determine how well your treatment plan is working. WHY SHOULD YOU MONITOR YOUR BLOOD GLUCOSE?  It can help you understand how food, exercise, and medicine affect your blood glucose.  It allows you to know what your blood glucose is at any given moment. You can quickly tell if you are having low blood glucose (hypoglycemia) or high blood glucose (hyperglycemia).  It can help you and your health care provider know how to adjust your medicines.  It can help you understand how to manage an illness or adjust medicine for exercise. WHEN SHOULD YOU TEST? Your health care provider will help you decide how often you should check your blood glucose. This may depend on the type of diabetes you  have, your diabetes control, or the types of medicines you are taking. Be sure to write down all of your blood glucose readings so that this information can be reviewed with your health care provider. See below for examples of testing times that your health care provider may suggest. Type 1 Diabetes  Test 4 times a day if you are in good control, using an insulin pump, or perform multiple daily injections.  If your diabetes is not well controlled or if you are sick, you may need to monitor more often.  It is a good idea to also monitor:  Before and after exercise.  Between meals and 2 hours after a meal.  Occasionally between 2:00 a.m. and 3:00 a.m. Type 2 Diabetes  It can vary with each person, but generally, if you are on insulin, test 4 times a day.  If you take medicines by mouth (orally), test 2 times a day.  If you are on a controlled diet, test once a day.  If your diabetes is not well controlled or if you are sick, you may need to monitor more often. HOW TO MONITOR YOUR BLOOD GLUCOSE Supplies Needed  Blood glucose meter.  Test strips for your meter. Each meter has its own strips. You must use the strips that go with your own meter.  A pricking needle (lancet).  A device that holds the lancet (lancing device).  A journal or log book to write down your results. Procedure  Wash your hands with soap and water. Alcohol is not preferred.  Prick the side of your finger (not the tip) with the lancet.  Gently milk the finger until a small drop of blood appears.  Follow the instructions that come with your meter for inserting the test strip, applying blood to the strip, and using your blood glucose meter. Other Areas to Get Blood for Testing Some meters allow you to use other areas of your body (other than your finger) to test your blood. These areas are called alternative sites. The most common alternative sites are:  The forearm.  The thigh.  The back area of the  lower leg.  The palm of the hand. The blood flow in these areas is slower. Therefore, the blood glucose values you get may be delayed, and the numbers are different from what you would get from your fingers. Do not use alternative sites if you think you are having hypoglycemia. Your reading will not be accurate. Always use a finger if you are having hypoglycemia. Also, if you cannot feel your lows (hypoglycemia unawareness), always use your fingers for your blood glucose checks. ADDITIONAL TIPS FOR GLUCOSE MONITORING  Do not reuse lancets.  Always carry your supplies with you.  All blood glucose meters have a 24-hour "hotline" number to call if you have questions or need help.  Adjust (calibrate) your blood glucose meter with a control solution after finishing a few boxes of strips. BLOOD GLUCOSE RECORD KEEPING It is a good idea to keep a daily record or log of your blood glucose readings. Most glucose meters, if not all, keep your glucose records stored in the meter. Some meters come with the ability to download your records to your home computer. Keeping a record of your blood glucose readings is especially helpful if you are wanting to look for patterns. Make notes to go along with the blood glucose readings because you might forget what happened at that exact time. Keeping good records helps you and your health care provider to work together to achieve good diabetes management.  Document Released: 09/01/2003 Document Revised: 01/13/2014 Document Reviewed: 01/21/2013 [facility] Patient Information 2015 [place], [place]. This information is not intended to replace advice given to you by your health care provider. Make sure you discuss any questions you have with your health care provider.

## 2015-02-13 NOTE — Assessment & Plan Note (Signed)
Continue Crestor--med refill given

## 2015-02-19 ENCOUNTER — Ambulatory Visit (INDEPENDENT_AMBULATORY_CARE_PROVIDER_SITE_OTHER): Payer: Self-pay | Admitting: Cardiology

## 2015-02-19 ENCOUNTER — Encounter: Payer: Self-pay | Admitting: Cardiology

## 2015-02-19 VITALS — BP 122/84 | Wt 263.0 lb

## 2015-02-19 DIAGNOSIS — E785 Hyperlipidemia, unspecified: Secondary | ICD-10-CM

## 2015-02-19 DIAGNOSIS — Z9861 Coronary angioplasty status: Secondary | ICD-10-CM

## 2015-02-19 DIAGNOSIS — I214 Non-ST elevation (NSTEMI) myocardial infarction: Secondary | ICD-10-CM

## 2015-02-19 DIAGNOSIS — I10 Essential (primary) hypertension: Secondary | ICD-10-CM

## 2015-02-19 DIAGNOSIS — Z720 Tobacco use: Secondary | ICD-10-CM

## 2015-02-19 DIAGNOSIS — I2510 Atherosclerotic heart disease of native coronary artery without angina pectoris: Secondary | ICD-10-CM
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
## 2015-02-19 NOTE — Patient Instructions (Signed)
We will check blood work today  Continue your current therapy and try and stay off the cigarettes.  I will see you in 6 months.

## 2015-02-20 LAB — HEPATIC FUNCTION PANEL
ALT: 14 U/L (ref 0–53)
AST: 14 U/L (ref 0–37)
Albumin: 4.1 g/dL (ref 3.5–5.2)
Alkaline Phosphatase: 108 U/L (ref 39–117)
BILIRUBIN DIRECT: 0.1 mg/dL (ref 0.0–0.3)
BILIRUBIN INDIRECT: 0.3 mg/dL (ref 0.2–1.2)
Total Bilirubin: 0.4 mg/dL (ref 0.2–1.2)
Total Protein: 6.9 g/dL (ref 6.0–8.3)

## 2015-02-20 LAB — BASIC METABOLIC PANEL
BUN: 13 mg/dL (ref 6–23)
CALCIUM: 8.9 mg/dL (ref 8.4–10.5)
CO2: 25 meq/L (ref 19–32)
Chloride: 100 mEq/L (ref 96–112)
Creat: 0.79 mg/dL (ref 0.50–1.35)
GLUCOSE: 181 mg/dL — AB (ref 70–99)
POTASSIUM: 4 meq/L (ref 3.5–5.3)
SODIUM: 138 meq/L (ref 135–145)

## 2015-02-20 LAB — LIPID PANEL
CHOLESTEROL: 83 mg/dL (ref 0–200)
HDL: 30 mg/dL — ABNORMAL LOW (ref >=40)
LDL Cholesterol: 28 mg/dL (ref 0–99)
Total CHOL/HDL Ratio: 2.8 Ratio
Triglycerides: 124 mg/dL (ref <150)
VLDL: 25 mg/dL (ref 0–40)

## 2015-02-27 IMAGING — CR DG CHEST 2V
2 series · 2 of 2 positions shown · non-contrast
Comparison: 09/29/2012

CLINICAL DATA: Intermittent chest pain

CHEST - 2 VIEW

[w chest pa]
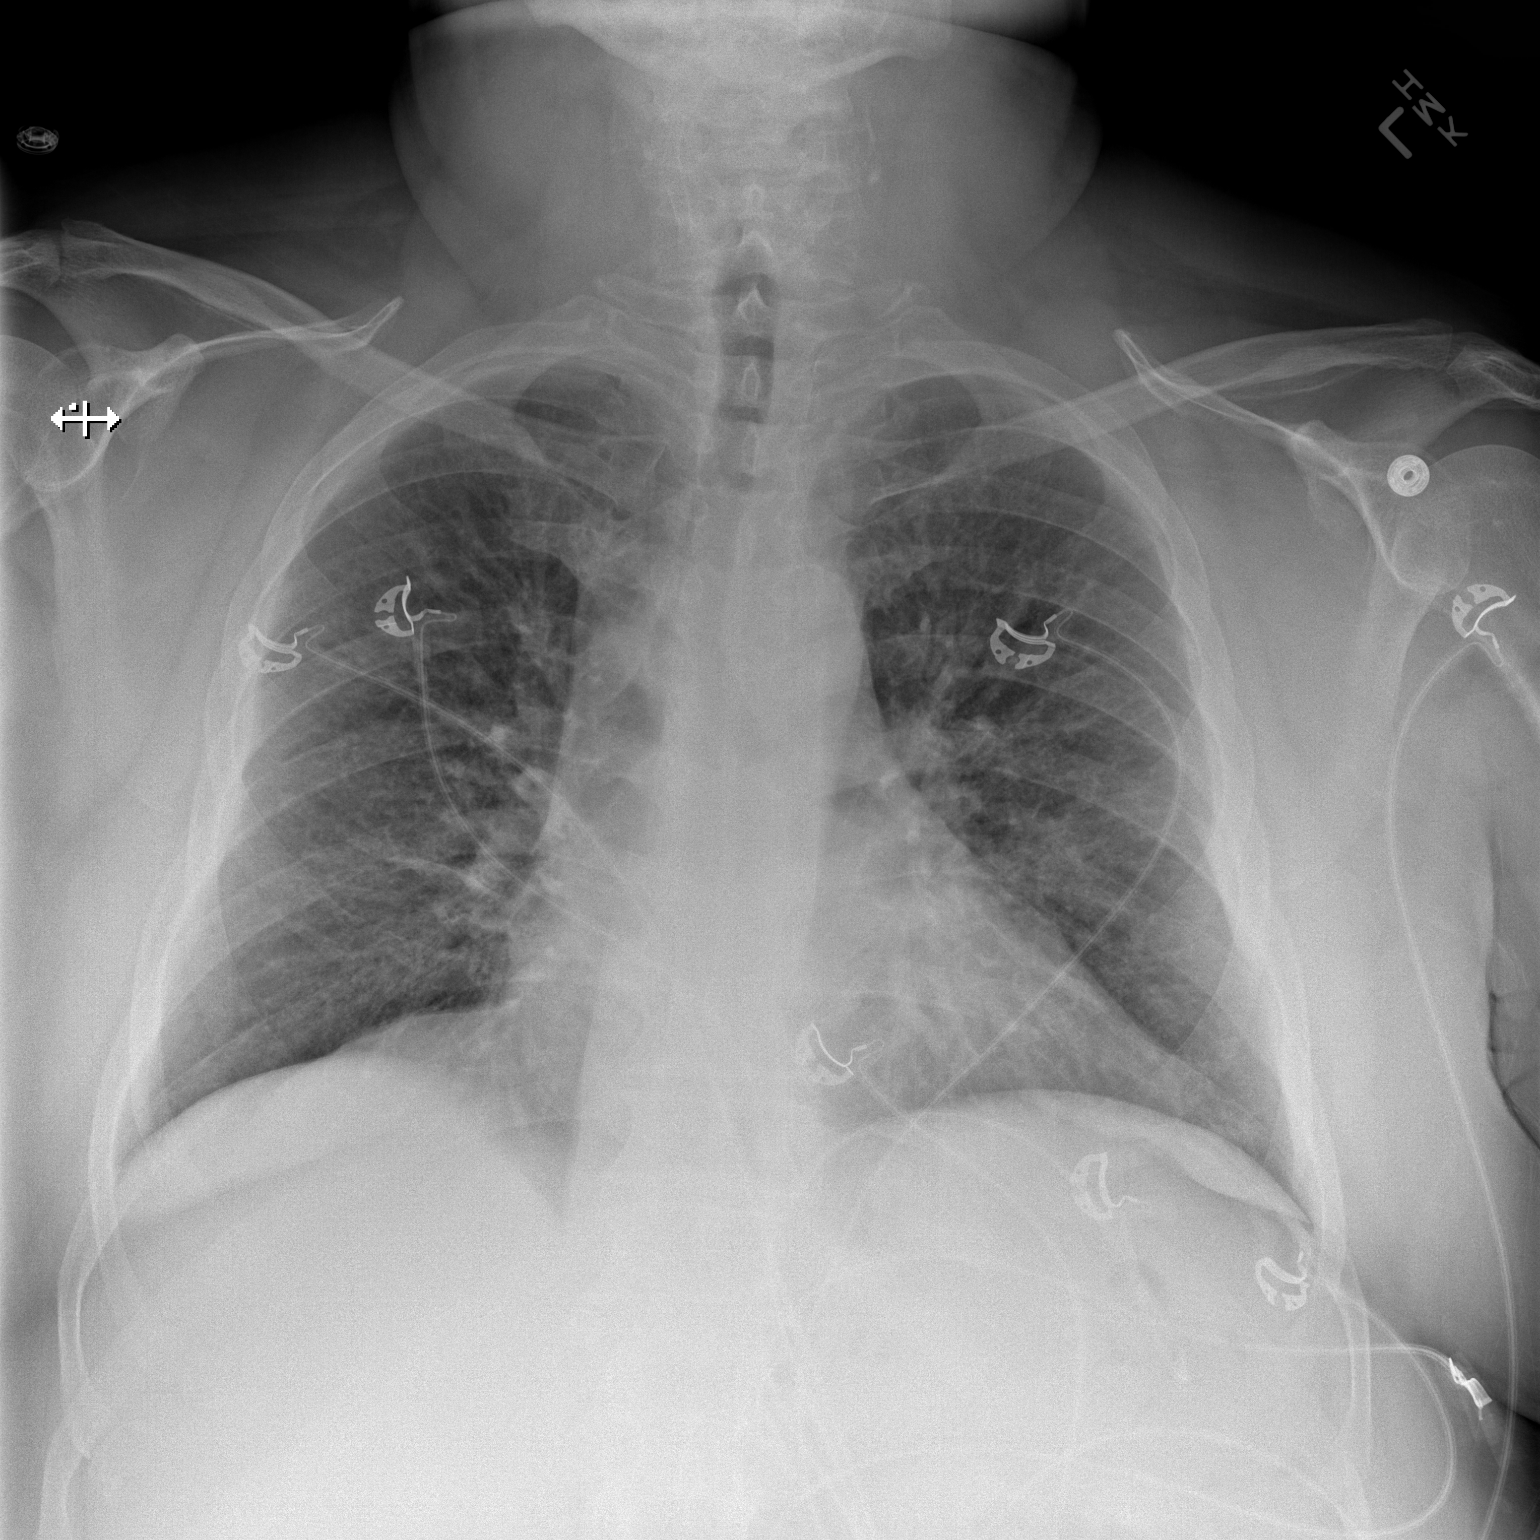

[w chest lat]
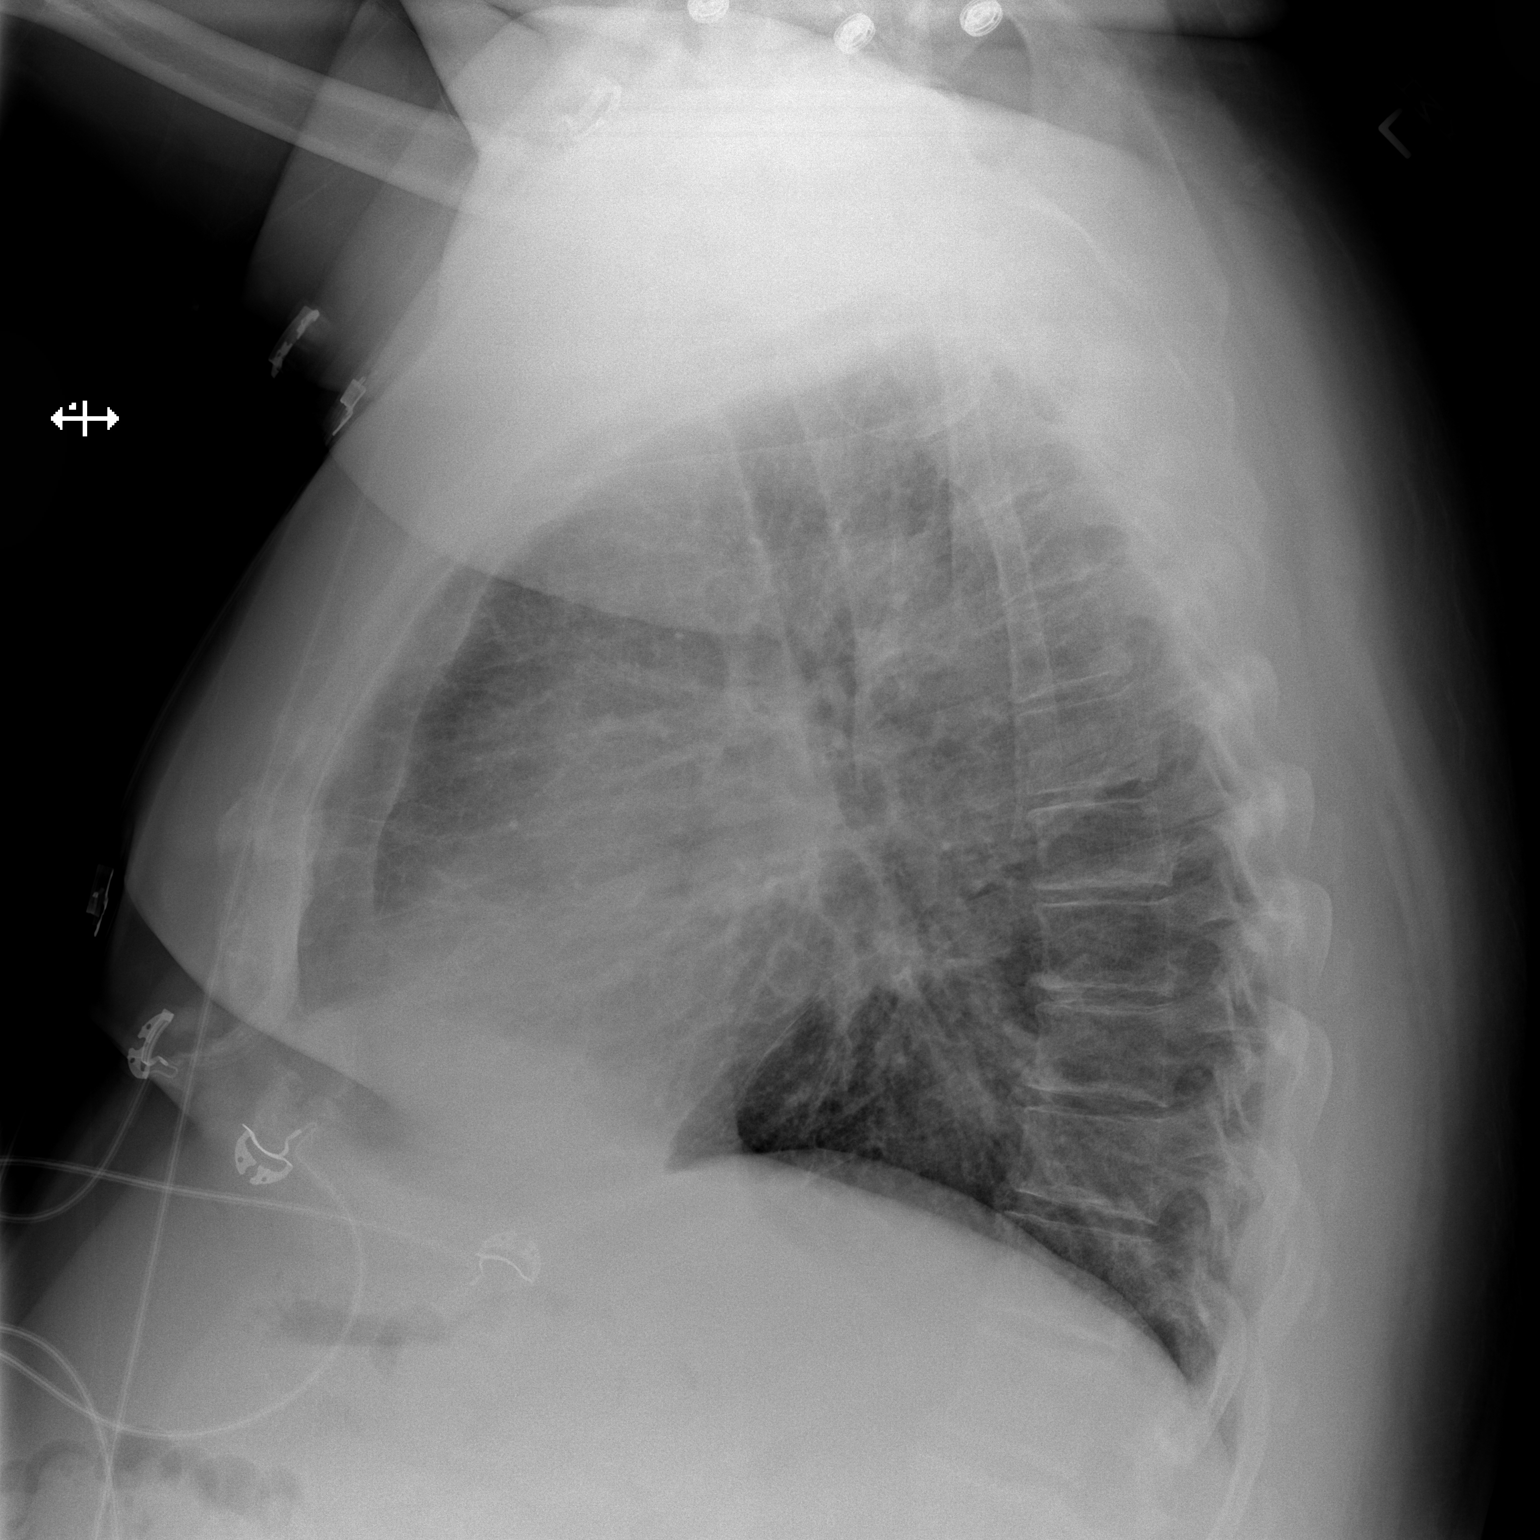

[2 of 2 positions shown; findings below may reference images not displayed]

FINDINGS: Cardiomediastinal silhouette is stable.  No acute
infiltrate or pleural effusion.  No pulmonary edema.  Stable
probable chronic mild interstitial prominence.
IMPRESSION: No acute infiltrate or pulmonary edema.  Stable probable chronic
mild interstitial prominence.

## 2015-03-03 ENCOUNTER — Telehealth: Payer: Self-pay | Admitting: *Deleted

## 2015-03-03 DIAGNOSIS — E1159 Type 2 diabetes mellitus with other circulatory complications: Secondary | ICD-10-CM

## 2015-03-03 MED ORDER — INSULIN GLARGINE 100 UNIT/ML ~~LOC~~ SOLN
35.0000 [IU] | Freq: Two times a day (BID) | SUBCUTANEOUS | Status: DC
Start: 2015-03-03 — End: 2015-09-15

## 2015-03-23 NOTE — Progress Notes (Unsigned)
This encounter was created in error - please disregard.

## 2015-05-18 ENCOUNTER — Other Ambulatory Visit: Payer: Self-pay | Admitting: Family Medicine

## 2015-05-25 ENCOUNTER — Telehealth: Payer: Self-pay | Admitting: Cardiology

## 2015-05-25 NOTE — Telephone Encounter (Signed)
Patient calling the office for samples of medication:   1.  What medication and dosage are you requesting samples for? Effient   2.  Are you currently out of this medication? Yes   3. Are you requesting samples to get you through until a mail order prescription arrives? No  *Pt says that he has an appt over on [address] and wanted to pick up some samples while he was there if any are available*

## 2015-05-26 NOTE — Telephone Encounter (Signed)
Samples of Effient 10mg  7 bottles provided to patient.

## 2015-05-27 ENCOUNTER — Encounter: Payer: Self-pay | Admitting: Internal Medicine

## 2015-05-27 ENCOUNTER — Ambulatory Visit (INDEPENDENT_AMBULATORY_CARE_PROVIDER_SITE_OTHER): Payer: Medicaid Other | Admitting: Family Medicine

## 2015-05-27 ENCOUNTER — Encounter: Payer: Self-pay | Admitting: Family Medicine

## 2015-05-27 VITALS — BP 115/59 | HR 75 | Temp 98.5°F | Ht 67.0 in | Wt 265.0 lb

## 2015-05-27 DIAGNOSIS — G4733 Obstructive sleep apnea (adult) (pediatric): Secondary | ICD-10-CM

## 2015-05-27 DIAGNOSIS — M545 Low back pain, unspecified: Secondary | ICD-10-CM

## 2015-05-27 DIAGNOSIS — Z23 Encounter for immunization: Secondary | ICD-10-CM

## 2015-05-27 DIAGNOSIS — E119 Type 2 diabetes mellitus without complications: Secondary | ICD-10-CM

## 2015-05-27 DIAGNOSIS — I10 Essential (primary) hypertension: Secondary | ICD-10-CM

## 2015-05-27 DIAGNOSIS — Z1211 Encounter for screening for malignant neoplasm of colon: Secondary | ICD-10-CM

## 2015-05-27 DIAGNOSIS — H9193 Unspecified hearing loss, bilateral: Secondary | ICD-10-CM

## 2015-05-27 DIAGNOSIS — Z9989 Dependence on other enabling machines and devices: Secondary | ICD-10-CM

## 2015-05-27 LAB — POCT GLYCOSYLATED HEMOGLOBIN (HGB A1C): Hemoglobin A1C: 8.6

## 2015-05-27 MED ORDER — METFORMIN HCL 1000 MG PO TABS: 1000.0000 mg | ORAL_TABLET | Freq: Two times a day (BID) | ORAL | Status: DC

## 2015-05-27 MED ORDER — LISINOPRIL 5 MG PO TABS: ORAL_TABLET | ORAL | Status: DC

## 2015-05-27 MED ORDER — TRAMADOL HCL 50 MG PO TABS: 50.0000 mg | ORAL_TABLET | Freq: Four times a day (QID) | ORAL | Status: DC | PRN

## 2015-05-27 NOTE — Patient Instructions (Signed)
DASH Eating Plan DASH stands for "Dietary Approaches to Stop Hypertension." The DASH eating plan is a healthy eating plan that has been shown to reduce high blood pressure (hypertension). Additional health benefits may include reducing the risk of type 2 diabetes mellitus, heart disease, and stroke. The DASH eating plan may also help with weight loss. WHAT DO I NEED TO KNOW ABOUT THE DASH EATING PLAN? For the DASH eating plan, you will follow these general guidelines:  Choose foods with a percent daily value for sodium of less than 5% (as listed on the food label).  Use salt-free seasonings or herbs instead of table salt or sea salt.  Check with your health care provider or pharmacist before using salt substitutes.  Eat lower-sodium products, often labeled as "lower sodium" or "no salt added."  Eat fresh foods.  Eat more vegetables, fruits, and low-fat dairy products.  Choose whole grains. Look for the word "whole" as the first word in the ingredient list.  Choose fish and skinless chicken or turkey more often than red meat. Limit fish, poultry, and meat to 6 oz (170 g) each day.  Limit sweets, desserts, sugars, and sugary drinks.  Choose heart-healthy fats.  Limit cheese to 1 oz (28 g) per day.  Eat more home-cooked food and less restaurant, buffet, and fast food.  Limit fried foods.  Cook foods using methods other than frying.  Limit canned vegetables. If you do use them, rinse them well to decrease the sodium.  When eating at a restaurant, ask that your food be prepared with less salt, or no salt if possible. WHAT FOODS CAN I EAT? Seek help from a dietitian for individual calorie needs. Grains Whole grain or whole wheat bread. Brown rice. Whole grain or whole wheat pasta. Quinoa, bulgur, and whole grain cereals. Low-sodium cereals. Corn or whole wheat flour tortillas. Whole grain cornbread. Whole grain crackers. Low-sodium crackers. Vegetables Fresh or frozen vegetables  (raw, steamed, roasted, or grilled). Low-sodium or reduced-sodium tomato and vegetable juices. Low-sodium or reduced-sodium tomato sauce and paste. Low-sodium or reduced-sodium canned vegetables.  Fruits All fresh, canned (in natural juice), or frozen fruits. Meat and Other Protein Products Ground beef (85% or leaner), grass-fed beef, or beef trimmed of fat. Skinless chicken or turkey. Ground chicken or turkey. Pork trimmed of fat. All fish and seafood. Eggs. Dried beans, peas, or lentils. Unsalted nuts and seeds. Unsalted canned beans. Dairy Low-fat dairy products, such as skim or 1% milk, 2% or reduced-fat cheeses, low-fat ricotta or cottage cheese, or plain low-fat yogurt. Low-sodium or reduced-sodium cheeses. Fats and Oils Tub margarines without trans fats. Light or reduced-fat mayonnaise and salad dressings (reduced sodium). Avocado. Safflower, olive, or canola oils. Natural peanut or almond butter. Other Unsalted popcorn and pretzels. The items listed above may not be a complete list of recommended foods or beverages. Contact your dietitian for more options. WHAT FOODS ARE NOT RECOMMENDED? Grains White bread. White pasta. White rice. Refined cornbread. Bagels and croissants. Crackers that contain trans fat. Vegetables Creamed or fried vegetables. Vegetables in a cheese sauce. Regular canned vegetables. Regular canned tomato sauce and paste. Regular tomato and vegetable juices. Fruits Dried fruits. Canned fruit in light or heavy syrup. Fruit juice. Meat and Other Protein Products Fatty cuts of meat. Ribs, chicken wings, bacon, sausage, bologna, salami, chitterlings, fatback, hot dogs, bratwurst, and packaged luncheon meats. Salted nuts and seeds. Canned beans with salt. Dairy Whole or 2% milk, cream, half-and-half, and cream cheese. Whole-fat or sweetened yogurt. Full-fat   cheeses or blue cheese. Nondairy creamers and whipped toppings. Processed cheese, cheese spreads, or cheese  curds. Condiments Onion and garlic salt, seasoned salt, table salt, and sea salt. Canned and packaged gravies. Worcestershire sauce. Tartar sauce. Barbecue sauce. Teriyaki sauce. Soy sauce, including reduced sodium. Steak sauce. Fish sauce. Oyster sauce. Cocktail sauce. Horseradish. Ketchup and mustard. Meat flavorings and tenderizers. Bouillon cubes. Hot sauce. Tabasco sauce. Marinades. Taco seasonings. Relishes. Fats and Oils Butter, stick margarine, lard, shortening, ghee, and bacon fat. Coconut, palm kernel, or palm oils. Regular salad dressings. Other Pickles and olives. Salted popcorn and pretzels. The items listed above may not be a complete list of foods and beverages to avoid. Contact your dietitian for more information. WHERE CAN I FIND MORE INFORMATION? National Heart, Lung, and Blood Institute: www.nhlbi.nih.gov/health/health-topics/topics/dash/ Document Released: 08/18/2011 Document Revised: 01/13/2014 Document Reviewed: 07/03/2013 ExitCare Patient Information 2015 ExitCare, LLC. This information is not intended to replace advice given to you by your health care provider. Make sure you discuss any questions you have with your health care provider. Basic Carbohydrate Counting for Diabetes Mellitus Carbohydrate counting is a method for keeping track of the amount of carbohydrates you eat. Eating carbohydrates naturally increases the level of sugar (glucose) in your blood, so it is important for you to know the amount that is okay for you to have in every meal. Carbohydrate counting helps keep the level of glucose in your blood within normal limits. The amount of carbohydrates allowed is different for every person. A dietitian can help you calculate the amount that is right for you. Once you know the amount of carbohydrates you can have, you can count the carbohydrates in the foods you want to eat. Carbohydrates are found in the following foods:  Grains, such as breads and  cereals.  Dried beans and soy products.  Starchy vegetables, such as potatoes, peas, and corn.  Fruit and fruit juices.  Milk and yogurt.  Sweets and snack foods, such as cake, cookies, candy, chips, soft drinks, and fruit drinks. CARBOHYDRATE COUNTING There are two ways to count the carbohydrates in your food. You can use either of the methods or a combination of both. Reading the "Nutrition Facts" on Packaged Food The "Nutrition Facts" is an area that is included on the labels of almost all packaged food and beverages in the [place]. It includes the serving size of that food or beverage and information about the nutrients in each serving of the food, including the grams (g) of carbohydrate per serving.  Decide the number of servings of this food or beverage that you will be able to eat or drink. Multiply that number of servings by the number of grams of carbohydrate that is listed on the label for that serving. The total will be the amount of carbohydrates you will be having when you eat or drink this food or beverage. Learning Standard Serving Sizes of Food When you eat food that is not packaged or does not include "Nutrition Facts" on the label, you need to measure the servings in order to count the amount of carbohydrates.A serving of most carbohydrate-rich foods contains about 15 g of carbohydrates. The following list includes serving sizes of carbohydrate-rich foods that provide 15 g ofcarbohydrate per serving:   1 slice of bread (1 oz) or 1 six-inch tortilla.    of a hamburger bun or English muffin.  4-6 crackers.   cup unsweetened dry cereal.    cup hot cereal.   cup rice or pasta.      cup mashed potatoes or  of a large baked potato.  1 cup fresh fruit or one small piece of fruit.    cup canned or frozen fruit or fruit juice.  1 cup milk.   cup plain fat-free yogurt or yogurt sweetened with artificial sweeteners.   cup cooked dried beans or starchy  vegetable, such as peas, corn, or potatoes.  Decide the number of standard-size servings that you will eat. Multiply that number of servings by 15 (the grams of carbohydrates in that serving). For example, if you eat 2 cups of strawberries, you will have eaten 2 servings and 30 g of carbohydrates (2 servings x 15 g = 30 g). For foods such as soups and casseroles, in which more than one food is mixed in, you will need to count the carbohydrates in each food that is included. EXAMPLE OF CARBOHYDRATE COUNTING Sample Dinner  3 oz chicken breast.   cup of brown rice.   cup of corn.  1 cup milk.   1 cup strawberries with sugar-free whipped topping.  Carbohydrate Calculation Step 1: Identify the foods that contain carbohydrates:   Rice.   Corn.   Milk.   Strawberries. Step 2:Calculate the number of servings eaten of each:   2 servings of rice.   1 serving of corn.   1 serving of milk.   1 serving of strawberries. Step 3: Multiply each of those number of servings by 15 g:   2 servings of rice x 15 g = 30 g.   1 serving of corn x 15 g = 15 g.   1 serving of milk x 15 g = 15 g.   1 serving of strawberries x 15 g = 15 g. Step 4: Add together all of the amounts to find the total grams of carbohydrates eaten: 30 g + 15 g + 15 g + 15 g = 75 g. Document Released: 08/29/2005 Document Revised: 01/13/2014 Document Reviewed: 07/26/2013 ExitCare Patient Information 2015 ExitCare, LLC. This information is not intended to replace advice given to you by your health care provider. Make sure you discuss any questions you have with your health care provider.  

## 2015-05-27 NOTE — Progress Notes (Signed)
[facility] was [facility]  He plans to drop it off with them since he was told "he would need an appointment with them." [facility]

## 2015-05-28 ENCOUNTER — Telehealth: Payer: Self-pay | Admitting: Family Medicine

## 2015-05-28 DIAGNOSIS — G4733 Obstructive sleep apnea (adult) (pediatric): Secondary | ICD-10-CM

## 2015-05-28 DIAGNOSIS — Z9989 Dependence on other enabling machines and devices: Secondary | ICD-10-CM

## 2015-05-28 NOTE — Telephone Encounter (Signed)
Per Ms Gilford Rile at [facility],  An Rx needs to be faxed to [facility]  Atten: Mr Randall An. Ms Gilford Rile was very rude over the phone.  She stated this was "secretarial work"  She didn't want to give her name because "that had nothing to do with this" It was explained pt had only made this request within the last 20 minutes

## 2015-05-28 NOTE — Assessment & Plan Note (Signed)
Refilled his tramadol today.

## 2015-05-28 NOTE — Progress Notes (Signed)
    Subjective:    Patient ID: Danarius Mcconathy is a 54 y.o. male presenting with Follow-up  on 05/27/2015  HPI: Here today for f/u.  Needs medications updated and refilled.  Continues to work on smoking cessation.  Would like referral to audiology for hearing eval. Will see an optho for eye exam soon. F/u of his DM--has issues with too many carbs in the summer time.  Taking meds as directed. Needs new face mask for OSA.  Review of Systems  Constitutional: Negative for fever and chills.  Respiratory: Negative for shortness of breath.   Cardiovascular: Negative for leg swelling.  Gastrointestinal: Negative for nausea, vomiting and abdominal pain.      Objective:    BP 115/59 mmHg  Pulse 75  Temp(Src) 98.5 F (36.9 C) (Oral)  Ht 5\' 7"  (1.702 m)  Wt 265 lb (120.203 kg)  BMI 41.50 kg/m2 Physical Exam  Constitutional: He appears well-developed and well-nourished. No distress.  HENT:  Head: Normocephalic and atraumatic.  Eyes: No scleral icterus.  Neck: Neck supple.  Cardiovascular: Normal rate.   Pulmonary/Chest: Effort normal.  Abdominal: Soft.  Musculoskeletal: He exhibits no edema.  Neurological: He is alert.  Skin: Skin is warm.  Psychiatric: He has a normal mood and affect.  Vitals reviewed.  HgbA1C is 8.6 up from 7.8.     Assessment & Plan:   Problem List Items Addressed This Visit      Medium   Essential hypertension (Chronic)    BP well controlled.  continue lisinopril--refilled today.      Relevant Medications   lisinopril (PRINIVIL,ZESTRIL) 5 MG tablet   OSA on CPAP (Chronic)    Given rx for Home health Agency to give him a new face mask.      Relevant Orders   For home use only DME continuous positive airway pressure (CPAP)   DM type 2 (diabetes mellitus, type 2) - Primary    Continue insulin and work more on diet and exercise.      Relevant Medications   lisinopril (PRINIVIL,ZESTRIL) 5 MG tablet   metFORMIN (GLUCOPHAGE) 1000 MG tablet   Other  Relevant Orders   POCT A1C (Completed)     Unprioritized   Back pain    Refilled his tramadol today.      Relevant Medications   traMADol (ULTRAM) 50 MG tablet    Other Visit Diagnoses    Hearing loss, bilateral        Relevant Orders    Ambulatory referral to Audiology    Screen for colon cancer        Relevant Orders    Ambulatory referral to Gastroenterology       Total face-to-face time with patient: 25 minutes. Over 50% of encounter was spent on counseling and coordination of care. Return in about 3 months (around 08/26/2015).  PRATT,TANYA S 05/28/2015 9:57 AM

## 2015-05-28 NOTE — Telephone Encounter (Signed)
Needs letter for Cpap machine faxed to [phone]  #[phone] (ext)  [place]

## 2015-05-28 NOTE — Assessment & Plan Note (Signed)
BP well controlled.  continue lisinopril--refilled today.

## 2015-05-28 NOTE — Assessment & Plan Note (Signed)
Given rx for Home health Agency to give him a new face mask.

## 2015-05-28 NOTE — Telephone Encounter (Signed)
Spoke with Freda Munro with [facility] and she received a call from patient this morning requesting a new CPAP machine.  She states that in order for this to be processed they need the most recent office note, insurance information, sleep study and cpap settings on a script pad.  Will forward to MD to write script for cpap settings.  I printed the insurance info, note and study results.  She would like to know if there could be more documentation added to the office note as to why patient needs an updated machine.  Jesenia Spera,CMA

## 2015-05-28 NOTE — Telephone Encounter (Signed)
[facility] needs RX for the pressure settings and copy of sleep study that was done in 2010.  [phone]---either [name] [name] or [name] [name]

## 2015-05-28 NOTE — Assessment & Plan Note (Signed)
Continue insulin and work more on diet and exercise.

## 2015-06-08 NOTE — Telephone Encounter (Signed)
Pt called. [facility] states they have not received any information about Cpap machine. Pt is requesting a machine "since medicaid pays for it".  He will call back tomorrow to see what has been done

## 2015-06-08 NOTE — Telephone Encounter (Signed)
Will forward to MD to check on getting the script for this cpap machine. Jazmin Hartsell,CMA

## 2015-06-09 NOTE — Telephone Encounter (Signed)
I spoke with Hunt Oris, CSW and this order can be placed in Epic under a DME order.  Once it is placed we will contact [facility] since they are able to pull orders from Epic.   Jazmin Hartsell,CMA

## 2015-06-09 NOTE — Telephone Encounter (Signed)
Will forward to Hunt Oris, [place].  She will contact [facility] and let them know order has been placed for this.  I have printed copies of notes if we need to fax them over. Jazmin Hartsell,CMA

## 2015-06-09 NOTE — Telephone Encounter (Signed)
I advised patient that this issue is stilling being worked through. Patient states he will call back in afternoon to see if there has been a resolution.

## 2015-06-10 NOTE — Telephone Encounter (Signed)
CSW has informed Melissa with [facility] of cipap order. CSW will notify PCP if additional information is needed.  Hunt Oris, MSW, [place]

## 2015-06-11 NOTE — Telephone Encounter (Signed)
[facility] informed of updated cipap worder with settings.  Hunt Oris, MSW, [place]

## 2015-06-11 NOTE — Telephone Encounter (Signed)
CSW informed by [facility] that the pressure setting needs to placed on the cipap order. This can be found on pts sleep study. Once order has been updated CSW will notify [facility].  Hunt Oris, MSW, [place]

## 2015-06-16 ENCOUNTER — Other Ambulatory Visit: Payer: Self-pay | Admitting: Family Medicine

## 2015-06-16 ENCOUNTER — Telehealth: Payer: Self-pay | Admitting: Family Medicine

## 2015-06-16 DIAGNOSIS — Z9989 Dependence on other enabling machines and devices: Principal | ICD-10-CM

## 2015-06-16 DIAGNOSIS — G4733 Obstructive sleep apnea (adult) (pediatric): Secondary | ICD-10-CM

## 2015-06-16 NOTE — Telephone Encounter (Signed)
[name] [name] contacted the company who was to have received a fax from [place] regarding his CPAP order.  They said they never received the documents.  Please resend and contact patient to inform.

## 2015-06-16 NOTE — Telephone Encounter (Signed)
Called Rx into [facility]. Yanil Dawe, CMA.

## 2015-06-16 NOTE — Telephone Encounter (Signed)
Tried to contact [facility] regarding this order.  We have faxed and placed orders in epic.  We have also contacted Darlina Guys with [facility] and she is aware that these orders are there.  Will forward to CSW to see if she has heard anything different from [place]. Sriman Tally,CMA

## 2015-06-17 NOTE — Telephone Encounter (Signed)
Spoke with pt. He wants a new sleep study done and would prefer to have it done on the weekend

## 2015-06-17 NOTE — Telephone Encounter (Signed)
LM for patient asking him if he wants to get a new sleep study done or if he wants to just keep the machine he already has.  I discussed this with Dr. Kennon Rounds and will leave up to patient before placing order. Jazmin Hartsell,CMA

## 2015-06-17 NOTE — Telephone Encounter (Signed)
Patient will be hearing from the [facility] long sleep center to schedule this appt. Jazmin Hartsell,CMA

## 2015-06-17 NOTE — Telephone Encounter (Signed)
-----   Message from Darlina Guys sent at 06/17/2015 11:01 AM EDT ----- Irving Copas.    Unfortunately, I can't respond to Patient Calls with my Epic access so I'm sending a staff message instead. The order that I received from you last week did not make it in to my office for some reason so thank you for following up.  This morning, I was able to find out that this patient received his first PAP unit from [facility] charity program in 2010.  Now that he has Medicaid, we cannot provide another machine to him until he has a new diagnostic sleep study done.  In order for Medicaid to cover new unit, he has to have a new diagnostic study as they will not accept a study that is more than one year old. ( The study in Epic is from 2010).  They will also not accept a home study, so he will have to be scheduled in the lab for a diagnostic study not just a titration.  Please let me know if you have any questions.  Thanks  [employer]

## 2015-07-01 ENCOUNTER — Telehealth: Payer: Self-pay | Admitting: Family Medicine

## 2015-07-01 DIAGNOSIS — E1159 Type 2 diabetes mellitus with other circulatory complications: Secondary | ICD-10-CM

## 2015-07-01 MED ORDER — GLUCOSE BLOOD VI STRP: ORAL_STRIP | Status: DC

## 2015-07-01 MED ORDER — ACCU-CHEK NANO SMARTVIEW W/DEVICE KIT: 1.0000 [IU] | PACK | Status: DC

## 2015-07-01 MED ORDER — ACCU-CHEK FASTCLIX LANCETS MISC: 1.0000 [IU] | Freq: Four times a day (QID) | Status: DC

## 2015-07-01 NOTE — Telephone Encounter (Signed)
Will forward to MD to write script for new meter and supplies. Christepher Melchior,CMA

## 2015-07-01 NOTE — Telephone Encounter (Signed)
Patient requesting an RX for a new glucometer. His old one is showing an "error" and will not read his glucose. He will also need lancets.

## 2015-07-06 ENCOUNTER — Telehealth: Payer: Self-pay | Admitting: Family Medicine

## 2015-07-06 DIAGNOSIS — Z794 Long term (current) use of insulin: Principal | ICD-10-CM

## 2015-07-06 DIAGNOSIS — E1110 Type 2 diabetes mellitus with ketoacidosis without coma: Secondary | ICD-10-CM

## 2015-07-06 MED ORDER — LIRAGLUTIDE 18 MG/3ML ~~LOC~~ SOPN: 1.8000 mg | PEN_INJECTOR | Freq: Every day | SUBCUTANEOUS | Status: DC

## 2015-07-06 NOTE — Telephone Encounter (Signed)
LMOVM asking pt to call back and needed to know if he needs refills on insulin syringes or lancets. If pt calls back please ask if he needs refill for insulin syringes or lancets. Rayburn Mundis, CMA.

## 2015-07-06 NOTE — Telephone Encounter (Signed)
Refill request from pt for needles. Will forward to PCP for review. Tru Leopard, CMA.

## 2015-07-06 NOTE — Telephone Encounter (Signed)
Pt returned call, I asked as directed below and the pt kept repeating "I need the Victoza, the pins". Sadie Reynolds, ASA

## 2015-07-06 NOTE — Telephone Encounter (Signed)
Pt called and said that he needs a refills on his needles call in. jw

## 2015-07-07 ENCOUNTER — Telehealth: Payer: Self-pay | Admitting: Cardiology

## 2015-07-07 NOTE — Telephone Encounter (Signed)
Pt is established on effient, got samples in September, will route to sally for further advisement.Marland Kitchen

## 2015-07-07 NOTE — Telephone Encounter (Signed)
Patient calling the office for samples of medication:   1.  What medication and dosage are you requesting samples for? effiant  2.  Are you currently out of this medication? Few pillsleft  3. Are you requesting samples to get you through until you receive your prescription? Yes in [place]

## 2015-07-08 MED ORDER — PRASUGREL HCL 10 MG PO TABS: 10.0000 mg | ORAL_TABLET | Freq: Every day | ORAL | Status: DC

## 2015-07-08 NOTE — Addendum Note (Signed)
Addended by: Elberta Leatherwood R on: 07/08/2015 09:59 AM   Modules accepted: Orders

## 2015-07-08 NOTE — Telephone Encounter (Signed)
LMOM for pt.  Samples left at front desk.  Pt should qualify for patient assistance.  Asked him to call back to verify if anyone has ever offered this service to him.

## 2015-07-08 NOTE — Telephone Encounter (Signed)
Spoke with pt.  He now has Medicaid and has had this for several months.  Will send in Rx to local pharmacy.

## 2015-07-08 NOTE — Telephone Encounter (Signed)
Kennyth Lose from [facility] is calling stating the pt needs a refill on victoza pens, pt says this was prescribed by his previous doctor but is now being followed here.

## 2015-07-10 ENCOUNTER — Ambulatory Visit: Payer: Medicaid Other | Attending: Audiology | Admitting: Audiology

## 2015-07-10 DIAGNOSIS — H900 Conductive hearing loss, bilateral: Secondary | ICD-10-CM | POA: Insufficient documentation

## 2015-07-10 DIAGNOSIS — Z01110 Encounter for hearing examination following failed hearing screening: Secondary | ICD-10-CM | POA: Diagnosis present

## 2015-07-10 NOTE — Procedures (Signed)
[place], [place]  [address] [address] EVALUATION  Patient Name: [name] [name]  Medical Record Number:  [id number] Date of Birth:  11-22-1960     Date of Test:  07/10/2015  HISTORY:  Rodriguez, a 54 y.o. old male was seen for audiological evaluation upon referral of PRATT,TANYA S, MD.   The patient reported a gradual decrease of hearing for the past 7-10 years .  A hearing evaluation has not been conducted previously with the exception of annual audiograms done at [facility] during his employment.  He reported those tests indicated significant hearing loss however, he was unable to follow up with diagnostic evaluations due to the lack of health insurance. He reports occupational noise exposure for 19 years until he went on disability in 2013. There was no report of familial history of hearing loss, significant ear infections, ear surgeries,  tinnitus, facial numbness or dizziness. He stated significant difficulty with understanding conversational speech in quiet, in groups, listening to the TV and/or in listening to a speaker from another room.  Juanmanuel reports previous diagnoses of diabetes, coronary disease, hypertension and hyperlipidemia.   Current Outpatient Prescriptions   Medication  Sig  Dispense  Refill   .  ACCU-CHEK FASTCLIX LANCETS MISC  1 Units by Percutaneous route 4 (four) times daily.  100 each  0   .  albuterol (PROVENTIL HFA;VENTOLIN HFA) 108 (90 BASE) MCG/ACT inhaler  Inhale 1-2 puffs into the lungs every 6 (six) hours as needed for wheezing or shortness of breath.  1 Inhaler  0   .  aspirin 81 MG chewable tablet  Chew 1 tablet (81 mg total) by mouth daily.  180 tablet  2   .  carvedilol (COREG) 3.125 MG tablet  Take 1 tablet (3.125 mg total) by mouth 2 (two) times daily with a meal.  60 tablet  3   .  insulin aspart (NOVOLOG) 100 UNIT/ML injection  Inject 25 Units into the skin 3 (three)  times daily with meals.       .  insulin glargine (LANTUS) 100 UNIT/ML injection  Inject 0.35 mLs (35 Units total) into the skin 2 (two) times daily.  10 mL  3   .  Insulin Syringe-Needle U-100 (FREESTYLE PRECISION INS SYR) 31G X 5/16" 1 ML MISC  1 Units by Does not apply route as directed. Dx: Diabetes 250.02.  Dispense:  QS for 5 shots daily - 1 month supply  100 each  0   .  Liraglutide 18 MG/3ML SOPN  Inject 1.8 mg into the skin daily. Dispense QS for 1 month supply  3 pen  11   .  lisinopril (PRINIVIL,ZESTRIL) 5 MG tablet  Take 0.5 tablets (2.5 mg total) by mouth 2 (two) times daily.  60 tablet  11   .  metFORMIN (GLUCOPHAGE) 1000 MG tablet  Take 1 tablet (1,000 mg total) by mouth 2 (two) times daily with a meal.  60 tablet  11   .  mometasone-formoterol (DULERA) 200-5 MCG/ACT AERO  Inhale 2 puffs into the lungs 2 (two) times daily.  1 Inhaler  0   .  nitroGLYCERIN (NITROSTAT) 0.4 MG SL tablet  Place 1 tablet (0.4 mg total) under the tongue every 5 (five) minutes x 3 doses as needed for chest pain.  100 tablet  3   .  Omega-3 Fatty Acids (FISH OIL PO)  Take 1 capsule by mouth 2 (two) times daily.        Marland Kitchen  prasugrel (EFFIENT) 10 MG TABS tablet  Take 1 tablet (10 mg total) by mouth daily.  30 tablet  3   .  rosuvastatin (CRESTOR) 20 MG tablet  Take 1 tablet (20 mg total) by mouth daily.  90 tablet  3   .  traMADol (ULTRAM) 50 MG tablet  Take 1 tablet (50 mg total) by mouth every 6 (six) hours as needed.  10 tablet  0       REPORT OF PAIN:  None  OTOSCOPIC EXAM:  Right: thick dull tympanic membrane with 2 possible very small white patches near the midline   Left:  Could not observe due to non-occlusive wax blocking view.  EVALUATION:   Air and masked bone conduction audiometry from 500Hz  - 8000Hz  utilizing insert earphones revealed a mild to severe mostly conductive loss bilaterally. A slight neural component is seen at 500Hz  and 2000Hz .   Speech reception thresholds were consistent with the  pure tone results indicative of good test reliability.  Speech recognition testing was conducted in each ear independently, at a comfortable listening level (65dBHL) and indicated 80% and 72% in the right and left ears respectively. Immittance Audiometry was utilized and Type A Tympanograms were obtained on the the right and left sides indicating normal ear canal volume, compliance and pressure, HOWEVER excessive width (gradients) are observed on both sides suggesting middle ear pathology.  Acoustic reflexes were tested from 500Hz  - 2000Hz  and were absent on the right side and absent on the left side.  Distortion Product Otoacoustic Emissions were not tested due to the suspected middle ear pathology  CONCLUSION:   Moderate to severe, mostly conductive hearing loss bilaterally with a slight neural component noted at 500Hz  and 2000Hz .  Speech recognition is impaired at an elevated conversational level and without background noise as an added factor.  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  RECOMMENDATIONS:    1. Referral to an otolaryngologist is strongly suggested to assess/treat middle ear pathologoy.  Re-evaluation and recommendations to follow post treatment.    Ivonne Andrew Pugh, Au.D. Doctor of Audiology CCC-A 07/10/2015

## 2015-07-10 NOTE — Patient Instructions (Signed)
CONCLUSION:   Moderate to severe, mostly conductive hearing loss bilaterally with a slight neural component noted at 500Hz  and 2000Hz .  Speech recognition is impaired at an elevated conversational level and without background noise as an added factor.  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  RECOMMENDATIONS:    1. Referral to an otolaryngologist is strongly suggested to assess/treat middle ear pathologoy.  Re-evaluation and recommendations to follow post treatment.

## 2015-07-13 ENCOUNTER — Telehealth: Payer: Self-pay | Admitting: *Deleted

## 2015-07-13 NOTE — Telephone Encounter (Signed)
Attempted to call patient a third time. Needs office visit appointment.

## 2015-07-13 NOTE — Telephone Encounter (Signed)
Message left for patient to call and cancel previsit and see MD or extender prior to colonoscopy scheduled 08/03/15. Appears patient is on blood thinner and had stent placement less than 6 months ago. He will need cardiac clearance as well as office visit prior to colonoscopy.

## 2015-07-21 ENCOUNTER — Ambulatory Visit (HOSPITAL_BASED_OUTPATIENT_CLINIC_OR_DEPARTMENT_OTHER): Payer: Medicaid Other | Attending: Family Medicine | Admitting: Radiology

## 2015-07-21 VITALS — Ht 68.0 in | Wt 260.0 lb

## 2015-07-21 DIAGNOSIS — R0683 Snoring: Secondary | ICD-10-CM | POA: Diagnosis not present

## 2015-07-21 DIAGNOSIS — R51 Headache: Secondary | ICD-10-CM | POA: Insufficient documentation

## 2015-07-21 DIAGNOSIS — Z6841 Body Mass Index (BMI) 40.0 and over, adult: Secondary | ICD-10-CM | POA: Diagnosis not present

## 2015-07-21 DIAGNOSIS — G4733 Obstructive sleep apnea (adult) (pediatric): Secondary | ICD-10-CM | POA: Diagnosis not present

## 2015-07-21 DIAGNOSIS — Z9989 Dependence on other enabling machines and devices: Secondary | ICD-10-CM

## 2015-07-21 DIAGNOSIS — I10 Essential (primary) hypertension: Secondary | ICD-10-CM | POA: Insufficient documentation

## 2015-07-21 DIAGNOSIS — E119 Type 2 diabetes mellitus without complications: Secondary | ICD-10-CM | POA: Insufficient documentation

## 2015-07-21 DIAGNOSIS — R5383 Other fatigue: Secondary | ICD-10-CM | POA: Diagnosis not present

## 2015-07-21 DIAGNOSIS — E669 Obesity, unspecified: Secondary | ICD-10-CM | POA: Insufficient documentation

## 2015-07-22 MED ORDER — INSULIN PEN NEEDLE 29G X 12.7MM MISC: Status: DC

## 2015-07-22 MED ORDER — LIRAGLUTIDE 18 MG/3ML ~~LOC~~ SOPN: 1.8000 mg | PEN_INJECTOR | Freq: Every day | SUBCUTANEOUS | Status: DC

## 2015-07-22 NOTE — Telephone Encounter (Signed)
Patient called stating he had not had this Victoza in a month.  He needed pen needles.  Pen needled ordered and refill for Victoza sent in to patient's pharmacy.  Derl Barrow, RN

## 2015-07-25 DIAGNOSIS — G4733 Obstructive sleep apnea (adult) (pediatric): Secondary | ICD-10-CM | POA: Diagnosis not present

## 2015-07-25 NOTE — Progress Notes (Signed)
Patient Name: Cha, Gomillion Date: 07/21/2015 Gender: Male D.O.B: [birth year] Age (years): 54 Referring Provider: [name] Height (inches): 21 Interpreting Physician: Baird Lyons MD, ABSM Weight (lbs): 260 RPSGT: Carolin Coy BMI: 40 MRN: [id number] Neck Size: 19.00 CLINICAL INFORMATION Sleep Study Type: Split Night CPAP Indication for sleep study: Diabetes, Fatigue, Hypertension, Morning Headaches, Obesity, OSA, Snoring, Witnessed Apneas Epworth Sleepiness Score: 12  SLEEP STUDY TECHNIQUE As per the AASM Manual for the Scoring of Sleep and Associated Events v2.3 (April 2016) with a hypopnea requiring 4% desaturations. The channels recorded and monitored were frontal, central and occipital EEG, electrooculogram (EOG), submentalis EMG (chin), nasal and oral airflow, thoracic and abdominal wall motion, anterior tibialis EMG, snore microphone, electrocardiogram, and pulse oximetry. Continuous positive airway pressure (CPAP) was initiated when the patient met split night criteria and was titrated according to treat sleep-disordered breathing. MEDICATIONS Medications taken by the patient : charted for review Medications administered by patient during sleep study : No sleep medicine administered.  RESPIRATORY PARAMETERS Diagnostic Total AHI (/hr): 106.6 RDI (/hr): 109.1 OA Index (/hr): 4.2 CA Index (/hr): 0.0 REM AHI (/hr): N/A NREM AHI (/hr): 106.6 Supine AHI (/hr): 153.5 Non-supine AHI (/hr): 105.25 Min O2 Sat (%): 86.00 Mean O2 (%): 93.62 Time below 88% (min): 1.2   Titration Optimal Pressure (cm): 14 AHI at Optimal Pressure (/hr): 0.0 Min O2 at Optimal Pressure (%): 89.0 Supine % at Optimal (%): 0 Sleep % at Optimal (%): 99    SLEEP ARCHITECTURE The recording time for the entire night was 433.1 minutes. During a baseline period of 130.4 minutes, the patient slept for 71.5 minutes in REM and nonREM, yielding a sleep efficiency of 54.8%. Sleep onset after lights out  was 5.7 minutes with a REM latency of N/A minutes. The patient spent 64.34% of the night in stage N1 sleep, 35.66% in stage N2 sleep, 0.00% in stage N3 and 0.00% in REM. During the titration period of 298.2 minutes, the patient slept for 263.0 minutes in REM and nonREM, yielding a sleep efficiency of 88.2%. Sleep onset after CPAP initiation was 27.8 minutes with a REM latency of 50.0 minutes. The patient spent 6.27% of the night in stage N1 sleep, 66.92% in stage N2 sleep, 0.00% in stage N3 and 26.81% in REM.  CARDIAC DATA The 2 lead EKG demonstrated sinus rhythm. The mean heart rate was 75.68 beats per minute. Other EKG findings include: None.  LEG MOVEMENT DATA The total Periodic Limb Movements of Sleep (PLMS) were 197. The PLMS index was 35.34 .  IMPRESSIONS - Severe obstructive sleep apnea occurred during the diagnostic portion of the study (AHI = 106.6/hour). An optimal PAP pressure was selected for this patient ( 14 cm of water) - No significant central sleep apnea occurred during the diagnostic portion of the study (CAI = 0.0/hour). - Severe oxygen desaturation was noted during the diagnostic portion of the study (Min O2 = 86.00%). - The patient snored with Loud snoring volume during the diagnostic portion of the study. - No cardiac abnormalities were noted during this study. - Clinically significant periodic limb movements did not occur during sleep.  DIAGNOSIS - Obstructive Sleep Apnea (327.23 [G47.33 ICD-10])  RECOMMENDATIONS - Trial of CPAP therapy on 14 cm H2O with a Small size Resmed Full Face Mask Mirage Quattro mask and heated humidification. - Avoid alcohol, sedatives and other CNS depressants that may worsen sleep apnea and disrupt normal sleep architecture. - Sleep hygiene should be reviewed to assess factors that may  improve sleep quality. - Weight management and regular exercise should be initiated or continued.  Deneise Lever Diplomate, American Board of Sleep  Medicine  ELECTRONICALLY SIGNED ON:  07/25/2015, 10:01 AM [place] PH: [phone]   FX: [phone]

## 2015-07-29 ENCOUNTER — Encounter (HOSPITAL_COMMUNITY): Payer: Self-pay | Admitting: Emergency Medicine

## 2015-07-29 ENCOUNTER — Emergency Department (HOSPITAL_COMMUNITY)
Admission: EM | Admit: 2015-07-29 | Discharge: 2015-07-29 | Disposition: A | Discharge status: Home / Self Care | Payer: Medicaid Other | Attending: Emergency Medicine | Admitting: Emergency Medicine

## 2015-07-29 DIAGNOSIS — F329 Major depressive disorder, single episode, unspecified: Secondary | ICD-10-CM | POA: Diagnosis not present

## 2015-07-29 DIAGNOSIS — M1990 Unspecified osteoarthritis, unspecified site: Secondary | ICD-10-CM | POA: Insufficient documentation

## 2015-07-29 DIAGNOSIS — Z794 Long term (current) use of insulin: Secondary | ICD-10-CM | POA: Diagnosis not present

## 2015-07-29 DIAGNOSIS — Z9861 Coronary angioplasty status: Secondary | ICD-10-CM | POA: Insufficient documentation

## 2015-07-29 DIAGNOSIS — I252 Old myocardial infarction: Secondary | ICD-10-CM | POA: Insufficient documentation

## 2015-07-29 DIAGNOSIS — B353 Tinea pedis: Secondary | ICD-10-CM | POA: Insufficient documentation

## 2015-07-29 DIAGNOSIS — E1165 Type 2 diabetes mellitus with hyperglycemia: Secondary | ICD-10-CM | POA: Insufficient documentation

## 2015-07-29 DIAGNOSIS — Z8719 Personal history of other diseases of the digestive system: Secondary | ICD-10-CM | POA: Diagnosis not present

## 2015-07-29 DIAGNOSIS — F17210 Nicotine dependence, cigarettes, uncomplicated: Secondary | ICD-10-CM | POA: Diagnosis not present

## 2015-07-29 DIAGNOSIS — I10 Essential (primary) hypertension: Secondary | ICD-10-CM | POA: Insufficient documentation

## 2015-07-29 DIAGNOSIS — Z79899 Other long term (current) drug therapy: Secondary | ICD-10-CM | POA: Diagnosis not present

## 2015-07-29 DIAGNOSIS — J449 Chronic obstructive pulmonary disease, unspecified: Secondary | ICD-10-CM | POA: Diagnosis not present

## 2015-07-29 DIAGNOSIS — Z9989 Dependence on other enabling machines and devices: Secondary | ICD-10-CM | POA: Insufficient documentation

## 2015-07-29 DIAGNOSIS — Z7902 Long term (current) use of antithrombotics/antiplatelets: Secondary | ICD-10-CM | POA: Insufficient documentation

## 2015-07-29 DIAGNOSIS — E785 Hyperlipidemia, unspecified: Secondary | ICD-10-CM | POA: Insufficient documentation

## 2015-07-29 DIAGNOSIS — Z9889 Other specified postprocedural states: Secondary | ICD-10-CM | POA: Insufficient documentation

## 2015-07-29 DIAGNOSIS — R739 Hyperglycemia, unspecified: Secondary | ICD-10-CM | POA: Diagnosis present

## 2015-07-29 DIAGNOSIS — G8929 Other chronic pain: Secondary | ICD-10-CM | POA: Diagnosis not present

## 2015-07-29 DIAGNOSIS — I2510 Atherosclerotic heart disease of native coronary artery without angina pectoris: Secondary | ICD-10-CM | POA: Diagnosis not present

## 2015-07-29 DIAGNOSIS — G4733 Obstructive sleep apnea (adult) (pediatric): Secondary | ICD-10-CM | POA: Insufficient documentation

## 2015-07-29 LAB — CBC
HCT: 43.4 % (ref 39.0–52.0)
Hemoglobin: 14.9 g/dL (ref 13.0–17.0)
MCH: 30.7 pg (ref 26.0–34.0)
MCHC: 34.3 g/dL (ref 30.0–36.0)
MCV: 89.5 fL (ref 78.0–100.0)
PLATELETS: 264 10*3/uL (ref 150–400)
RBC: 4.85 MIL/uL (ref 4.22–5.81)
RDW: 12.5 % (ref 11.5–15.5)
WBC: 9.3 10*3/uL (ref 4.0–10.5)

## 2015-07-29 LAB — BASIC METABOLIC PANEL
Anion gap: 10 (ref 5–15)
BUN: 13 mg/dL (ref 6–20)
CALCIUM: 9.5 mg/dL (ref 8.9–10.3)
CO2: 25 mmol/L (ref 22–32)
CREATININE: 0.64 mg/dL (ref 0.61–1.24)
Chloride: 103 mmol/L (ref 101–111)
Glucose, Bld: 185 mg/dL — ABNORMAL HIGH (ref 65–99)
Potassium: 4 mmol/L (ref 3.5–5.1)
SODIUM: 138 mmol/L (ref 135–145)

## 2015-07-29 LAB — CBG MONITORING, ED: GLUCOSE-CAPILLARY: 166 mg/dL — AB (ref 65–99)

## 2015-07-29 MED ORDER — CLOTRIMAZOLE 1 % EX CREA: TOPICAL_CREAM | CUTANEOUS | Status: DC

## 2015-07-29 NOTE — Discharge Instructions (Signed)

## 2015-07-29 NOTE — ED Notes (Signed)
AVS explained in detail. PA in to speak with patient to emphasize importance of Lotrimin cream. Advised to always wear shoes and keep feet clean and dry. Has appointment with Kennon Rounds on December 8th. Given sandwich/drink/bus ticket. No other c/c.

## 2015-07-29 NOTE — ED Provider Notes (Signed)
CSN: [id number]     Arrival date & time 07/29/15  5038 History   First MD Initiated Contact with Patient 07/29/15 1043     Chief Complaint  Patient presents with  . Foot Ulcer  . Hyperglycemia     (Consider location/radiation/quality/duration/timing/severity/associated sxs/prior Treatment) HPI Comments: Patient with PMH of HTN, HL, CAD, and DM presents to the ED with a chief complaint of right foot wound.  Patient states that he noticed a wound to his right foot about a month ago.  He states that it has been slowly worsening over that time.  States that he has now noticed some spreading to his left foot.  He does not have sensation in his feet.  He denies fevers, chills, nausea, or vomiting.  States that his blood sugar has been running high.  He has not tried anything for his symptoms.  There are no aggravating or alleviating factors.  The history is provided by the patient. No language interpreter was used.    Past Medical History  Diagnosis Date  . Hypertension   . Hyperlipidemia   . CAD S/P percutaneous coronary angioplasty 01/18/12; 10/2013    a. pRCA 3.5 x 18 vision BMS - 4.2 mm; b. 2/'15: mCx 3.5 x 12  Rebel BMS (3.6-3.7 mm)  . GERD (gastroesophageal reflux disease)   . Type II diabetes mellitus ([place])   . Chronic back pain     "mid/lower" (08/01/2014)  . Depression   . Non-ST elevation myocardial infarction (NSTEMI) ([place]) 10/2013  . COPD (chronic obstructive pulmonary disease) ([place])     "I'm seeing COPD dr now; don't know if I've got it" (08/01/2014)  . OSA on CPAP 01/19/2012  . Migraines     "once in awhile" (08/01/2014)  . Arthritis     "qwhere"   Past Surgical History  Procedure Laterality Date  . Coronary angioplasty with stent placement  01/2012; 11/07/2013; 08/01/2014    "1 + 2 + 1"   . Cardiac catheterization  07/2014    Left Main: Short, large-caliber vessel. Widely patent. Bifurcates into the LAD and Circumflex. Angiographically normal.  . Coronary angioplasty with  stent placement  07/2014    Severe single-vessel disease involving the bifurcation of OM1 and OM 2 with 99% in-stent restenosis in the bare-metal stent placed in the OM 2.  . Left heart catheterization with coronary angiogram N/A 01/20/2012    Procedure: LEFT HEART CATHETERIZATION WITH CORONARY ANGIOGRAM;  Surgeon: Jettie Booze, MD;  Location[facility] C[facility] LAB;  Service: Cardiovascular;  Laterality: N/A;  . Percutaneous coronary stent intervention (pci-s)  01/20/2012    Procedure: PERCUTANEOUS CORONARY STENT INTERVENTION (PCI-S);  Surgeon: Jettie Booze, MD;  Location: [facility] CATH LAB;  Service: Cardiovascular;;  . Left heart catheterization with coronary angiogram N/A 11/07/2013    Procedure: LEFT HEART CATHETERIZATION WITH CORONARY ANGIOGRAM;  Surgeon: Jettie Booze, MD;  Location: [facility] CATH LAB;  Service: Cardiovascular;  Laterality: N/A;  . Percutaneous coronary stent intervention (pci-s)  11/07/2013    Procedure: PERCUTANEOUS CORONARY STENT INTERVENTION (PCI-S);  Surgeon: Jettie Booze, MD;  Location: [facility] CATH LAB;  Service: Cardiovascular;;  . Left heart catheterization with coronary angiogram N/A 08/01/2014    Procedure: LEFT HEART CATHETERIZATION WITH CORONARY ANGIOGRAM;  Surgeon: Leonie Man, MD;  Location: [facility] CATH LAB;  Service: Cardiovascular;  Laterality: N/A;   Family History  Problem Relation Age of Onset  . Heart attack Father 34  . Heart attack Brother   . Stroke Brother   .  Cancer Mother     breast  . Hypertension Sister   . Hyperlipidemia Sister   . Hyperlipidemia Sister   . Hypertension Sister    Social History  Substance Use Topics  . Smoking status: Current Some Day Smoker -- 0.50 packs/day for 37 years    Types: Cigarettes    Last Attempt to Quit: 12/18/2014  . Smokeless tobacco: Never Used  . Alcohol Use: 3.6 oz/week    6 Cans of beer per week     Comment: 08/01/2014 "8-12 beers once/ week"    Review of Systems  Constitutional: Negative for  fever and chills.  Respiratory: Negative for shortness of breath.   Cardiovascular: Negative for chest pain.  Gastrointestinal: Negative for nausea, vomiting, diarrhea and constipation.  Genitourinary: Negative for dysuria.  Skin: Positive for wound.  All other systems reviewed and are negative.     Allergies  Shellfish allergy  Home Medications   Prior to Admission medications   Medication Sig Start Date End Date Taking? Authorizing Provider  ACCU-CHEK FASTCLIX LANCETS MISC 1 Units by Percutaneous route 4 (four) times daily. 11/23/14   Maryann Mikhail, DO  ACCU-CHEK FASTCLIX LANCETS MISC 1 Units by Percutaneous route 4 (four) times daily. 07/01/15   [name], MD  albuterol (PROVENTIL HFA;VENTOLIN HFA) 108 (90 BASE) MCG/ACT inhaler Inhale 1-2 puffs into the lungs every 6 (six) hours as needed for wheezing or shortness of breath. Patient not taking: Reported on 05/27/2015 11/22/14   Fransico Meadow, PA-C  aspirin 81 MG chewable tablet Chew 1 tablet (81 mg total) by mouth daily. 10/04/13   [name], MD  Blood Glucose Monitoring Suppl (ACCU-CHEK NANO SMARTVIEW) W/DEVICE KIT 1 Units by Does not apply route as directed. Check fasting, and two hours after breakfast, lunch and dinner 07/01/15   [name], MD  carvedilol (COREG) 3.125 MG tablet Take 1 tablet (3.125 mg total) by mouth 2 (two) times daily with a meal. 12/25/14   Zenia Resides, MD  glucose blood (ACCU-CHEK SMARTVIEW) test strip Check blood sugars 4x/daily 07/01/15   [name], MD  insulin aspart (NOVOLOG) 100 UNIT/ML injection Inject 25 Units into the skin 3 (three) times daily with meals. 04/24/14   Zenia Resides, MD  insulin glargine (LANTUS) 100 UNIT/ML injection Inject 0.35 mLs (35 Units total) into the skin 2 (two) times daily. 03/03/15   [name], MD  Insulin Pen Needle (BD ULTRA-FINE PEN NEEDLES) 29G X 12.7MM MISC Use to with Victoza Pen, injection once daily. 07/22/15   [name], MD  Insulin  Syringe-Needle U-100 (FREESTYLE PRECISION INS SYR) 31G X 5/16" 1 ML MISC 1 Units by Does not apply route as directed. Dx: Diabetes 250.02.  Dispense:  QS for 5 shots daily - 1 month supply 11/23/14   Maryann Mikhail, DO  Liraglutide 18 MG/3ML SOPN Inject 0.3 mLs (1.8 mg total) into the skin daily. Dispense QS for 1 month supply 07/22/15   [name], MD  lisinopril (PRINIVIL,ZESTRIL) 5 MG tablet TAKE 0.5 TABLETS (2.5 MG TOTAL) BY MOUTH 2 (TWO) TIMES DAILY. 05/27/15   [name], MD  metFORMIN (GLUCOPHAGE) 1000 MG tablet Take 1 tablet (1,000 mg total) by mouth 2 (two) times daily with a meal. 05/27/15   [name], MD  mometasone-formoterol (DULERA) 200-5 MCG/ACT AERO Inhale 2 puffs into the lungs 2 (two) times daily. Patient not taking: Reported on 05/27/2015 12/25/14   Zenia Resides, MD  nitroGLYCERIN (NITROSTAT)  0.4 MG SL tablet Place 1 tablet (0.4 mg total) under the tongue every 5 (five) minutes x 3 doses as needed for chest pain. 02/13/15   [name], MD  Omega-3 Fatty Acids (FISH OIL PO) Take 1 capsule by mouth 2 (two) times daily.     Historical Provider, MD  prasugrel (EFFIENT) 10 MG TABS tablet Take 1 tablet (10 mg total) by mouth daily. 07/08/15   Peter M Martinique, MD  rosuvastatin (CRESTOR) 20 MG tablet Take 1 tablet (20 mg total) by mouth daily. 02/13/15   [name], MD  traMADol (ULTRAM) 50 MG tablet TAKE 1 TABLET BY MOUTH EVERY 6 HOURS AS NEEDED 06/16/15   [name], MD   BP 128/65 mmHg  Pulse 89  Temp(Src) 98.2 F (36.8 C) (Oral)  Resp 18  SpO2 92% Physical Exam  Constitutional: He is oriented to person, place, and time. He appears well-developed and well-nourished.  HENT:  Head: Normocephalic and atraumatic.  Eyes: Conjunctivae and EOM are normal. Pupils are equal, round, and reactive to light. Right eye exhibits no discharge. Left eye exhibits no discharge. No scleral icterus.  Neck: Normal range of motion. Neck supple. No JVD present.  Cardiovascular: Normal rate,  regular rhythm and normal heart sounds.  Exam reveals no gallop and no friction rub.   No murmur heard. Pulmonary/Chest: Effort normal and breath sounds normal. No respiratory distress. He has no wheezes. He has no rales. He exhibits no tenderness.  Abdominal: Soft. He exhibits no distension and no mass. There is no tenderness. There is no rebound and no guarding.  Musculoskeletal: Normal range of motion. He exhibits no edema or tenderness.  Neurological: He is alert and oriented to person, place, and time.  Skin: Skin is warm and dry.  As pictured below  Psychiatric: He has a normal mood and affect. His behavior is normal. Judgment and thought content normal.  Nursing note and vitals reviewed.   ED Course  Procedures (including critical care time)       MDM   Final diagnoses:  Tinea pedis of both feet  Hyperglycemia    Patient with diabetes, concerned about wound on foot. Wound on foot appears to be tinea pedis. Will treat with antifungal cream. Recommend daily foot checks, and close follow-up with PCP. Patient understands agrees the plan. Labs are reassuring today. Patient is stable and ready for discharge. Images reviewed with Dr. Rex Kras, who agrees plan.   Montine Circle, PA-C 07/29/15 [place], MD 07/30/15 [phone]

## 2015-07-29 NOTE — ED Notes (Signed)
Pt states over last month his toes on both feet have been getting red, swollen, and ulcerated. Open sores to right foot, no visible open sores to left but is having yellow drainage evident on the sock. States he is a diabetic and his sugars have been running high recently.

## 2015-08-03 ENCOUNTER — Telehealth: Payer: Self-pay | Admitting: *Deleted

## 2015-08-03 ENCOUNTER — Encounter: Payer: Medicaid Other | Admitting: Internal Medicine

## 2015-08-03 NOTE — Telephone Encounter (Signed)
07/29/15--Received fax request for prior authorization from [place] for [place].  Copy of Medicaid formulary and form placed in MD's box for completion.  Burna Forts, BSN, RN-BC  08/03/15-- Completed PA info in [place] for Victoza.  Status pending.  Will recheck status later today. Burna Forts, BSN, RN-BC

## 2015-08-04 NOTE — Telephone Encounter (Signed)
Prior approval for Victoza pen completed via [place] [employer].  Med approved for 08/03/15 - 07/28/16.  Prior approval # [id number].  [place] informed and med was processed with no problems.  Burna Forts, BSN, RN-BC

## 2015-08-05 ENCOUNTER — Other Ambulatory Visit: Payer: Self-pay | Admitting: Family Medicine

## 2015-08-12 ENCOUNTER — Telehealth: Payer: Self-pay | Admitting: Family Medicine

## 2015-08-12 ENCOUNTER — Encounter (HOSPITAL_COMMUNITY): Payer: Self-pay

## 2015-08-12 ENCOUNTER — Emergency Department (HOSPITAL_COMMUNITY)
Admission: EM | Admit: 2015-08-12 | Discharge: 2015-08-12 | Disposition: A | Discharge status: Home / Self Care | Payer: Medicaid Other | Attending: Emergency Medicine | Admitting: Emergency Medicine

## 2015-08-12 DIAGNOSIS — E785 Hyperlipidemia, unspecified: Secondary | ICD-10-CM | POA: Diagnosis not present

## 2015-08-12 DIAGNOSIS — I10 Essential (primary) hypertension: Secondary | ICD-10-CM | POA: Insufficient documentation

## 2015-08-12 DIAGNOSIS — I252 Old myocardial infarction: Secondary | ICD-10-CM | POA: Insufficient documentation

## 2015-08-12 DIAGNOSIS — G8929 Other chronic pain: Secondary | ICD-10-CM | POA: Insufficient documentation

## 2015-08-12 DIAGNOSIS — G4733 Obstructive sleep apnea (adult) (pediatric): Secondary | ICD-10-CM | POA: Diagnosis not present

## 2015-08-12 DIAGNOSIS — Z7951 Long term (current) use of inhaled steroids: Secondary | ICD-10-CM | POA: Insufficient documentation

## 2015-08-12 DIAGNOSIS — F329 Major depressive disorder, single episode, unspecified: Secondary | ICD-10-CM | POA: Diagnosis not present

## 2015-08-12 DIAGNOSIS — Z79899 Other long term (current) drug therapy: Secondary | ICD-10-CM | POA: Diagnosis not present

## 2015-08-12 DIAGNOSIS — M1990 Unspecified osteoarthritis, unspecified site: Secondary | ICD-10-CM | POA: Diagnosis not present

## 2015-08-12 DIAGNOSIS — I2510 Atherosclerotic heart disease of native coronary artery without angina pectoris: Secondary | ICD-10-CM | POA: Diagnosis not present

## 2015-08-12 DIAGNOSIS — J449 Chronic obstructive pulmonary disease, unspecified: Secondary | ICD-10-CM | POA: Insufficient documentation

## 2015-08-12 DIAGNOSIS — Z7982 Long term (current) use of aspirin: Secondary | ICD-10-CM | POA: Diagnosis not present

## 2015-08-12 DIAGNOSIS — Z794 Long term (current) use of insulin: Secondary | ICD-10-CM | POA: Insufficient documentation

## 2015-08-12 DIAGNOSIS — E11649 Type 2 diabetes mellitus with hypoglycemia without coma: Secondary | ICD-10-CM | POA: Diagnosis not present

## 2015-08-12 DIAGNOSIS — G43909 Migraine, unspecified, not intractable, without status migrainosus: Secondary | ICD-10-CM | POA: Diagnosis not present

## 2015-08-12 DIAGNOSIS — E162 Hypoglycemia, unspecified: Secondary | ICD-10-CM

## 2015-08-12 LAB — BASIC METABOLIC PANEL
ANION GAP: 8 (ref 5–15)
BUN: 16 mg/dL (ref 6–20)
CALCIUM: 9.2 mg/dL (ref 8.9–10.3)
CO2: 25 mmol/L (ref 22–32)
CREATININE: 0.7 mg/dL (ref 0.61–1.24)
Chloride: 103 mmol/L (ref 101–111)
GLUCOSE: 113 mg/dL — AB (ref 65–99)
Potassium: 3.6 mmol/L (ref 3.5–5.1)
SODIUM: 136 mmol/L (ref 135–145)

## 2015-08-12 LAB — ETHANOL: Alcohol, Ethyl (B): <5 mg/dL (ref <5)

## 2015-08-12 LAB — CBG MONITORING, ED
GLUCOSE-CAPILLARY: 90 mg/dL (ref 65–99)
GLUCOSE-CAPILLARY: 182 mg/dL — AB (ref 65–99)
Glucose-Capillary: 152 mg/dL — ABNORMAL HIGH (ref 65–99)

## 2015-08-12 LAB — CBC WITH DIFFERENTIAL/PLATELET
BASOS ABS: 0 10*3/uL (ref 0.0–0.1)
BASOS PCT: 0 %
EOS ABS: 0.2 10*3/uL (ref 0.0–0.7)
Eosinophils Relative: 2 %
HEMATOCRIT: 40 % (ref 39.0–52.0)
HEMOGLOBIN: 13.7 g/dL (ref 13.0–17.0)
Lymphocytes Relative: 19 %
Lymphs Abs: 2.4 10*3/uL (ref 0.7–4.0)
MCH: 30.8 pg (ref 26.0–34.0)
MCHC: 34.3 g/dL (ref 30.0–36.0)
MCV: 89.9 fL (ref 78.0–100.0)
MONO ABS: 0.9 10*3/uL (ref 0.1–1.0)
MONOS PCT: 7 %
NEUTROS ABS: 9 10*3/uL — AB (ref 1.7–7.7)
NEUTROS PCT: 72 %
Platelets: 218 10*3/uL (ref 150–400)
RBC: 4.45 MIL/uL (ref 4.22–5.81)
RDW: 12.2 % (ref 11.5–15.5)
WBC: 12.5 10*3/uL — ABNORMAL HIGH (ref 4.0–10.5)

## 2015-08-12 LAB — TROPONIN I: Troponin I: <0.03 ng/mL (ref <0.031)

## 2015-08-12 MED ORDER — DESOXIMETASONE 0.25 % EX CREA: 1.0000 "application " | TOPICAL_CREAM | Freq: Two times a day (BID) | CUTANEOUS | Status: DC

## 2015-08-12 MED ORDER — SODIUM CHLORIDE 0.9 % IV BOLUS (SEPSIS)
1000.0000 mL | Freq: Once | INTRAVENOUS | Status: AC
  Administered 2015-08-12: 1000 mL via INTRAVENOUS

## 2015-08-12 NOTE — ED Notes (Addendum)
Pt reports he has not had any ETOH since last week. Pt is audibly snoring in the room.

## 2015-08-12 NOTE — Telephone Encounter (Signed)
Patient dropped off papers about his medication being discontinued.  He says he needs this medicine.  I place the papers in dr's box.  He is also wanting to know about getting Zesoximetasone Cream Lotions 0.25% for his feet. He uses Public house manager at [facility].

## 2015-08-12 NOTE — ED Notes (Signed)
Pt presents via EMS from [name] for hypoglycemia. Upon EMS arrival, pt had just drank a liter of diet coke, a ham and cheese sandwich, and a slice of pizza and his sugar was then 96. EMS reported that pt believes he took too much insulin accidentally normally takes 25 units. Pt also believed to have ETOH on board.

## 2015-08-12 NOTE — Telephone Encounter (Signed)
Will forward to MD. [name] [name],CMA  

## 2015-08-12 NOTE — ED Provider Notes (Signed)
CSN: [id number]     Arrival date & time 08/12/15  1620 History   First MD Initiated Contact with Patient 08/12/15 1632     Chief Complaint  Patient presents with  . Hypoglycemia      HPI  Patient presents with diabetic complication.  He reports that he was at [employer] and he felt that his glucose was low.  He began consuming food/drink to help raise his glucose.  EMS was called and noted his CBG was 90.  He is now feeling improved.  He reports may have accidentally took too much insulin His course is improving and it was improved with eating/drinking.  He reports nausea and fatigue.  He reports mild SOB but no CP.  No new abd pain He had otherwise been at his baseline  Past Medical History  Diagnosis Date  . Hypertension   . Hyperlipidemia   . CAD S/P percutaneous coronary angioplasty 01/18/12; 10/2013    a. pRCA 3.5 x 18 vision BMS - 4.2 mm; b. 2/'15: mCx 3.5 x 12  Rebel BMS (3.6-3.7 mm)  . GERD (gastroesophageal reflux disease)   . Type II diabetes mellitus ([place])   . Chronic back pain     "mid/lower" (08/01/2014)  . Depression   . Non-ST elevation myocardial infarction (NSTEMI) ([place]) 10/2013  . COPD (chronic obstructive pulmonary disease) ([place])     "I'm seeing COPD dr now; don't know if I've got it" (08/01/2014)  . OSA on CPAP 01/19/2012  . Migraines     "once in awhile" (08/01/2014)  . Arthritis     "qwhere"   Past Surgical History  Procedure Laterality Date  . Coronary angioplasty with stent placement  01/2012; 11/07/2013; 08/01/2014    "1 + 2 + 1"   . Cardiac catheterization  07/2014    Left Main: Short, large-caliber vessel. Widely patent. Bifurcates into the LAD and Circumflex. Angiographically normal.  . Coronary angioplasty with stent placement  07/2014    Severe single-vessel disease involving the bifurcation of OM1 and OM 2 with 99% in-stent restenosis in the bare-metal stent placed in the OM 2.  . Left heart catheterization with coronary angiogram N/A 01/20/2012     Procedure: LEFT HEART CATHETERIZATION WITH CORONARY ANGIOGRAM;  Surgeon: Jettie Booze, MD;  Location: [facility] CATH LAB;  Service: Cardiovascular;  Laterality: N/A;  . Percutaneous coronary stent intervention (pci-s)  01/20/2012    Procedure: PERCUTANEOUS CORONARY STENT INTERVENTION (PCI-S);  Surgeon: Jettie Booze, MD;  Location: [facility] CATH LAB;  Service: Cardiovascular;;  . Left heart catheterization with coronary angiogram N/A 11/07/2013    Procedure: LEFT HEART CATHETERIZATION WITH CORONARY ANGIOGRAM;  Surgeon: Jettie Booze, MD;  Location: [facility] CATH LAB;  Service: Cardiovascular;  Laterality: N/A;  . Percutaneous coronary stent intervention (pci-s)  11/07/2013    Procedure: PERCUTANEOUS CORONARY STENT INTERVENTION (PCI-S);  Surgeon: Jettie Booze, MD;  Location: [facility] CATH LAB;  Service: Cardiovascular;;  . Left heart catheterization with coronary angiogram N/A 08/01/2014    Procedure: LEFT HEART CATHETERIZATION WITH CORONARY ANGIOGRAM;  Surgeon: Leonie Man, MD;  Location: [facility] CATH LAB;  Service: Cardiovascular;  Laterality: N/A;   Family History  Problem Relation Age of Onset  . Heart attack Father 47  . Heart attack Brother   . Stroke Brother   . Cancer Mother     breast  . Hypertension Sister   . Hyperlipidemia Sister   . Hyperlipidemia Sister   . Hypertension Sister    Social History  Substance Use Topics  . Smoking status: Current Some Day Smoker -- 0.50 packs/day for 37 years    Types: Cigarettes    Last Attempt to Quit: 12/18/2014  . Smokeless tobacco: Never Used  . Alcohol Use: 3.6 oz/week    6 Cans of beer per week     Comment: 08/01/2014 "8-12 beers once/ week"    Review of Systems  Constitutional: Positive for fatigue. Negative for fever.  Respiratory: Positive for shortness of breath.   Cardiovascular: Negative for chest pain.  Gastrointestinal: Positive for nausea.  All other systems reviewed and are negative.     Allergies  Shellfish  allergy  Home Medications   Prior to Admission medications   Medication Sig Start Date End Date Taking? Authorizing Provider  aspirin 81 MG chewable tablet Chew 1 tablet (81 mg total) by mouth daily. 10/04/13  Yes [name], MD  carvedilol (COREG) 3.125 MG tablet Take 1 tablet (3.125 mg total) by mouth 2 (two) times daily with a meal. 12/25/14  Yes Zenia Resides, MD  clotrimazole (LOTRIMIN) 1 % cream Apply to affected area 2 times daily 07/29/15  Yes Montine Circle, PA-C  insulin glargine (LANTUS) 100 UNIT/ML injection Inject 0.35 mLs (35 Units total) into the skin 2 (two) times daily. 03/03/15  Yes [name], MD  Liraglutide 18 MG/3ML SOPN Inject 0.3 mLs (1.8 mg total) into the skin daily. Dispense QS for 1 month supply 07/22/15  Yes [name], MD  lisinopril (PRINIVIL,ZESTRIL) 5 MG tablet TAKE 0.5 TABLETS (2.5 MG TOTAL) BY MOUTH 2 (TWO) TIMES DAILY. 05/27/15  Yes [name], MD  metFORMIN (GLUCOPHAGE) 1000 MG tablet Take 1 tablet (1,000 mg total) by mouth 2 (two) times daily with a meal. 05/27/15  Yes [name], MD  mometasone-formoterol (DULERA) 200-5 MCG/ACT AERO Inhale 2 puffs into the lungs 2 (two) times daily. 12/25/14  Yes Zenia Resides, MD  NOVOLOG 100 UNIT/ML injection INJECT 12 UNITS INTO THE SKIN THREE TIMES DAILY WITH MEALS Patient taking differently: INJECT 25 UNITS INTO THE SKIN TWICE DAILY WITH MEALS 08/05/15  Yes [name], MD  Omega-3 Fatty Acids (FISH OIL PO) Take 1 capsule by mouth 2 (two) times daily.    Yes Historical Provider, MD  prasugrel (EFFIENT) 10 MG TABS tablet Take 1 tablet (10 mg total) by mouth daily. 07/08/15  Yes Peter M Martinique, MD  rosuvastatin (CRESTOR) 20 MG tablet Take 1 tablet (20 mg total) by mouth daily. 02/13/15  Yes [name], MD  traMADol (ULTRAM) 50 MG tablet TAKE 1 TABLET BY MOUTH EVERY 6 HOURS AS NEEDED 06/16/15  Yes [name], MD  ACCU-CHEK FASTCLIX LANCETS MISC 1 Units by Percutaneous route 4 (four) times daily. 11/23/14    Maryann Mikhail, DO  ACCU-CHEK FASTCLIX LANCETS MISC 1 Units by Percutaneous route 4 (four) times daily. 07/01/15   [name], MD  albuterol (PROVENTIL HFA;VENTOLIN HFA) 108 (90 BASE) MCG/ACT inhaler Inhale 1-2 puffs into the lungs every 6 (six) hours as needed for wheezing or shortness of breath. 11/22/14   Fransico Meadow, PA-C  Blood Glucose Monitoring Suppl (ACCU-CHEK NANO SMARTVIEW) W/DEVICE KIT 1 Units by Does not apply route as directed. Check fasting, and two hours after breakfast, lunch and dinner 07/01/15   [name], MD  desoximetasone (TOPICORT) 0.25 % cream Apply 1 application topically 2 (two) times daily. 08/12/15   [name], MD  glucose blood (ACCU-CHEK SMARTVIEW) test strip Check  blood sugars 4x/daily 07/01/15   [name], MD  Insulin Pen Needle (BD ULTRA-FINE PEN NEEDLES) 29G X 12.7MM MISC Use to with Victoza Pen, injection once daily. 07/22/15   [name], MD  Insulin Syringe-Needle U-100 (FREESTYLE PRECISION INS SYR) 31G X 5/16" 1 ML MISC 1 Units by Does not apply route as directed. Dx: Diabetes 250.02.  Dispense:  QS for 5 shots daily - 1 month supply 11/23/14   Maryann Mikhail, DO  nitroGLYCERIN (NITROSTAT) 0.4 MG SL tablet Place 1 tablet (0.4 mg total) under the tongue every 5 (five) minutes x 3 doses as needed for chest pain. 02/13/15   [name], MD   BP 95/51 mmHg  Pulse 52  Temp(Src) 97.4 F (36.3 C) (Oral)  Resp 18  SpO2 97% Physical Exam CONSTITUTIONAL: Disheveled.  Sleeping when I enter room HEAD: Normocephalic/atraumatic EYES: EOMI ENMT: Mucous membranes moist NECK: supple no meningeal signs SPINE/BACK:entire spine nontender CV: S1/S2 noted, no murmurs/rubs/gallops noted LUNGS: Lungs are clear to auscultation bilaterally, no apparent distress ABDOMEN: soft, nontender, obese NEURO: Pt is appropriate, moves all extremitiesx4.  No facial droop.   EXTREMITIES: pulses normal/equal, full ROM SKIN: warm, color normal PSYCH: no abnormalities of  mood noted, alert and oriented to situation  ED Course  Procedures  5:19 PM Pt with probable hypoglycemia from accidental overuse of insulin His CBG after eating/drinking was over 90 He is now improved Will monitor in ED 8:18 PM Pt stable No distress No hypoglycemia He is safe for d/c home Advised to hold insulin tonight and go back to regular dosing tomorrow   Labs Review Labs Reviewed  CBC WITH DIFFERENTIAL/PLATELET - Abnormal; Notable for the following:    WBC 12.5 (*)    Neutro Abs 9.0 (*)    All other components within normal limits  BASIC METABOLIC PANEL - Abnormal; Notable for the following:    Glucose, Bld 113 (*)    All other components within normal limits  CBG MONITORING, ED - Abnormal; Notable for the following:    Glucose-Capillary 152 (*)    All other components within normal limits  CBG MONITORING, ED - Abnormal; Notable for the following:    Glucose-Capillary 182 (*)    All other components within normal limits  ETHANOL  TROPONIN I  CBG MONITORING, ED  CBG MONITORING, ED  CBG MONITORING, ED  CBG MONITORING, ED  CBG MONITORING, ED    Imaging Review No results found. I have personally reviewed and evaluated these images and lab results as part of my medical decision-making.   EKG Interpretation   Date/Time:  Wednesday August 12 2015 16:52:22 EST Ventricular Rate:  54 PR Interval:  204 QRS Duration: 88 QT Interval:  410 QTC Calculation: 388 R Axis:   41 Text Interpretation:  Sinus rhythm Borderline prolonged PR interval  Borderline repolarization abnormality Abnormal ekg Confirmed by Christy Gentles   MD, Elenore Rota ([id number]) on 08/12/2015 5:16:53 PM      MDM   Final diagnoses:  Hypoglycemia    Nursing notes including past medical history and social history reviewed and considered in documentation Labs/vital reviewed myself and considered during evaluation     Ripley Fraise, MD 08/12/15 2019

## 2015-08-12 NOTE — ED Notes (Signed)
Bed: WA06 Expected date:  Expected time:  Means of arrival:  Comments: 54 yo M hypoglycemic from [employer]

## 2015-08-12 NOTE — Discharge Instructions (Signed)

## 2015-08-13 ENCOUNTER — Telehealth: Payer: Self-pay | Admitting: Cardiology

## 2015-08-13 NOTE — Telephone Encounter (Signed)
Patient calling the office for samples of medication:   1.  What medication and dosage are you requesting samples for? Effient  2.  Are you currently out of this medication? Pt only has 6 days left

## 2015-08-13 NOTE — Telephone Encounter (Signed)
Our office is currently out of effient samples. Will route to [facility] to see if they have any.

## 2015-08-14 ENCOUNTER — Telehealth: Payer: Self-pay | Admitting: *Deleted

## 2015-08-14 NOTE — Telephone Encounter (Signed)
Medication samples have been provided to the patient. Drug name: Effient 10 mg Qty: 21 tabs LOT: [id number] C Exp.Date: 01/2016 Samples left at front desk for patient pick-up. Patient notified.  [name] [name], [name] 8:34 AM 08/14/2015

## 2015-08-14 NOTE — Telephone Encounter (Signed)
Prior Authorization received from [facility] for desoximetasone 0.25% cream.  Received PA approval for Desoximetasone 0.25% via [employer].  Med approved for 08/14/15 - 08/13/16.  [place] informed.  PA approval number [id number]. Derl Barrow, RN

## 2015-08-20 ENCOUNTER — Ambulatory Visit (INDEPENDENT_AMBULATORY_CARE_PROVIDER_SITE_OTHER): Payer: Medicaid Other | Admitting: Family Medicine

## 2015-08-20 ENCOUNTER — Encounter: Payer: Self-pay | Admitting: Family Medicine

## 2015-08-20 VITALS — BP 116/59 | HR 91 | Temp 98.5°F | Ht 68.0 in | Wt 265.0 lb

## 2015-08-20 DIAGNOSIS — H9193 Unspecified hearing loss, bilateral: Secondary | ICD-10-CM

## 2015-08-20 DIAGNOSIS — G4733 Obstructive sleep apnea (adult) (pediatric): Secondary | ICD-10-CM

## 2015-08-20 DIAGNOSIS — J441 Chronic obstructive pulmonary disease with (acute) exacerbation: Secondary | ICD-10-CM

## 2015-08-20 DIAGNOSIS — E1159 Type 2 diabetes mellitus with other circulatory complications: Secondary | ICD-10-CM

## 2015-08-20 DIAGNOSIS — Z720 Tobacco use: Secondary | ICD-10-CM

## 2015-08-20 DIAGNOSIS — R0602 Shortness of breath: Secondary | ICD-10-CM

## 2015-08-20 DIAGNOSIS — Z9989 Dependence on other enabling machines and devices: Secondary | ICD-10-CM

## 2015-08-20 DIAGNOSIS — I214 Non-ST elevation (NSTEMI) myocardial infarction: Secondary | ICD-10-CM

## 2015-08-20 LAB — POCT GLYCOSYLATED HEMOGLOBIN (HGB A1C): HEMOGLOBIN A1C: 8.7

## 2015-08-20 MED ORDER — NITROGLYCERIN 0.4 MG SL SUBL: 0.4000 mg | SUBLINGUAL_TABLET | SUBLINGUAL | Status: DC | PRN

## 2015-08-20 MED ORDER — MOMETASONE FURO-FORMOTEROL FUM 200-5 MCG/ACT IN AERO: 2.0000 | INHALATION_SPRAY | Freq: Two times a day (BID) | RESPIRATORY_TRACT | Status: DC

## 2015-08-20 NOTE — Progress Notes (Signed)
   Subjective:    Patient ID: [name] [name] is a 54 y.o. male presenting with   on 08/20/2015  HPI: Patient reports feet are better with some steroid cream. Has new CPAP machine. Still smoking some.  Using Victoza as directed.   BP is at goal.  Review of Systems  Constitutional: Negative for fever and chills.  Respiratory: Negative for shortness of breath.   Cardiovascular: Negative for leg swelling.  Gastrointestinal: Negative for nausea, vomiting and abdominal pain.      Objective:    Physical Exam  Constitutional: He appears well-developed and well-nourished. No distress.  HENT:  Head: Normocephalic and atraumatic.  Eyes: No scleral icterus.  Neck: Neck supple.  Cardiovascular: Normal rate.   Pulmonary/Chest: Effort normal.  Abdominal: Soft.  Musculoskeletal: He exhibits no edema.  Neurological: He is alert.  Skin: Skin is warm.  Psychiatric: He has a normal mood and affect.  Vitals reviewed.  HgbA1C - 8.9 - unchanged BS reviewed--few in the 300's after Thanksgiving, but most are 150-200's     Assessment & Plan:   Problem List Items Addressed This Visit      High   H/O NSTEMI (non-ST elevated myocardial infarction) (Chronic)   Relevant Medications   nitroGLYCERIN (NITROSTAT) 0.4 MG SL tablet     Medium   OSA on CPAP (Chronic)    Has new CPAP machine which is working well.      Tobacco use (Chronic)    Continue to encourage smoking cessation      DM type 2 (diabetes mellitus, type 2) (HCC) - Primary    Improve glycemic control with meds, diet and exercise.  Continue metformin and Lantus and Victoza--to see Podiatry      Relevant Orders   POCT A1C (Completed)   Ambulatory referral to Podiatry     Unprioritized   SOB (shortness of breath) on exertion   Relevant Medications   mometasone-formoterol (DULERA) 200-5 MCG/ACT AERO   Hearing difficulty   Relevant Orders   Ambulatory referral to ENT    Other Visit Diagnoses    COPD exacerbation ([place])         Relevant Medications    mometasone-formoterol (DULERA) 200-5 MCG/ACT AERO       Return in about 3 months (around 11/18/2015) for a follow-up.  PRATT,TANYA S 08/20/2015 11:16 AM

## 2015-08-20 NOTE — Patient Instructions (Signed)
DASH Eating Plan DASH stands for "Dietary Approaches to Stop Hypertension." The DASH eating plan is a healthy eating plan that has been shown to reduce high blood pressure (hypertension). Additional health benefits may include reducing the risk of type 2 diabetes mellitus, heart disease, and stroke. The DASH eating plan may also help with weight loss. WHAT DO I NEED TO KNOW ABOUT THE DASH EATING PLAN? For the DASH eating plan, you will follow these general guidelines:  Choose foods with a percent daily value for sodium of less than 5% (as listed on the food label).  Use salt-free seasonings or herbs instead of table salt or sea salt.  Check with your health care provider or pharmacist before using salt substitutes.  Eat lower-sodium products, often labeled as "lower sodium" or "no salt added."  Eat fresh foods.  Eat more vegetables, fruits, and low-fat dairy products.  Choose whole grains. Look for the word "whole" as the first word in the ingredient list.  Choose fish and skinless chicken or turkey more often than red meat. Limit fish, poultry, and meat to 6 oz (170 g) each day.  Limit sweets, desserts, sugars, and sugary drinks.  Choose heart-healthy fats.  Limit cheese to 1 oz (28 g) per day.  Eat more home-cooked food and less restaurant, buffet, and fast food.  Limit fried foods.  Cook foods using methods other than frying.  Limit canned vegetables. If you do use them, rinse them well to decrease the sodium.  When eating at a restaurant, ask that your food be prepared with less salt, or no salt if possible. WHAT FOODS CAN I EAT? Seek help from a dietitian for individual calorie needs. Grains Whole grain or whole wheat bread. Brown rice. Whole grain or whole wheat pasta. Quinoa, bulgur, and whole grain cereals. Low-sodium cereals. Corn or whole wheat flour tortillas. Whole grain cornbread. Whole grain crackers. Low-sodium crackers. Vegetables Fresh or frozen vegetables  (raw, steamed, roasted, or grilled). Low-sodium or reduced-sodium tomato and vegetable juices. Low-sodium or reduced-sodium tomato sauce and paste. Low-sodium or reduced-sodium canned vegetables.  Fruits All fresh, canned (in natural juice), or frozen fruits. Meat and Other Protein Products Ground beef (85% or leaner), grass-fed beef, or beef trimmed of fat. Skinless chicken or turkey. Ground chicken or turkey. Pork trimmed of fat. All fish and seafood. Eggs. Dried beans, peas, or lentils. Unsalted nuts and seeds. Unsalted canned beans. Dairy Low-fat dairy products, such as skim or 1% milk, 2% or reduced-fat cheeses, low-fat ricotta or cottage cheese, or plain low-fat yogurt. Low-sodium or reduced-sodium cheeses. Fats and Oils Tub margarines without trans fats. Light or reduced-fat mayonnaise and salad dressings (reduced sodium). Avocado. Safflower, olive, or canola oils. Natural peanut or almond butter. Other Unsalted popcorn and pretzels. The items listed above may not be a complete list of recommended foods or beverages. Contact your dietitian for more options. WHAT FOODS ARE NOT RECOMMENDED? Grains White bread. White pasta. White rice. Refined cornbread. Bagels and croissants. Crackers that contain trans fat. Vegetables Creamed or fried vegetables. Vegetables in a cheese sauce. Regular canned vegetables. Regular canned tomato sauce and paste. Regular tomato and vegetable juices. Fruits Dried fruits. Canned fruit in light or heavy syrup. Fruit juice. Meat and Other Protein Products Fatty cuts of meat. Ribs, chicken wings, bacon, sausage, bologna, salami, chitterlings, fatback, hot dogs, bratwurst, and packaged luncheon meats. Salted nuts and seeds. Canned beans with salt. Dairy Whole or 2% milk, cream, half-and-half, and cream cheese. Whole-fat or sweetened yogurt. Full-fat   cheeses or blue cheese. Nondairy creamers and whipped toppings. Processed cheese, cheese spreads, or cheese  curds. Condiments Onion and garlic salt, seasoned salt, table salt, and sea salt. Canned and packaged gravies. Worcestershire sauce. Tartar sauce. Barbecue sauce. Teriyaki sauce. Soy sauce, including reduced sodium. Steak sauce. Fish sauce. Oyster sauce. Cocktail sauce. Horseradish. Ketchup and mustard. Meat flavorings and tenderizers. Bouillon cubes. Hot sauce. Tabasco sauce. Marinades. Taco seasonings. Relishes. Fats and Oils Butter, stick margarine, lard, shortening, ghee, and bacon fat. Coconut, palm kernel, or palm oils. Regular salad dressings. Other Pickles and olives. Salted popcorn and pretzels. The items listed above may not be a complete list of foods and beverages to avoid. Contact your dietitian for more information. WHERE CAN I FIND MORE INFORMATION? National Heart, Lung, and Blood Institute: travelstabloid.com   This information is not intended to replace advice given to you by your health care provider. Make sure you discuss any questions you have with your health care provider.   Document Released: 08/18/2011 Document Revised: 09/19/2014 Document Reviewed: 07/03/2013 Elsevier Interactive Patient Education 2016 [employer]. Smoking Cessation, Tips for Success If you are ready to quit smoking, congratulations! You have chosen to help yourself be healthier. Cigarettes bring nicotine, tar, carbon monoxide, and other irritants into your body. Your lungs, heart, and blood vessels will be able to work better without these poisons. There are many different ways to quit smoking. Nicotine gum, nicotine patches, a nicotine inhaler, or nicotine nasal spray can help with physical craving. Hypnosis, support groups, and medicines help break the habit of smoking. WHAT THINGS CAN I DO TO MAKE QUITTING EASIER?  Here are some tips to help you quit for good:  Pick a date when you will quit smoking completely. Tell all of your friends and family about your plan to  quit on that date.  Do not try to slowly cut down on the number of cigarettes you are smoking. Pick a quit date and quit smoking completely starting on that day.  Throw away all cigarettes.   Clean and remove all ashtrays from your home, work, and car.  On a card, write down your reasons for quitting. Carry the card with you and read it when you get the urge to smoke.  Cleanse your body of nicotine. Drink enough water and fluids to keep your urine clear or pale yellow. Do this after quitting to flush the nicotine from your body.  Learn to predict your moods. Do not let a bad situation be your excuse to have a cigarette. Some situations in your life might tempt you into wanting a cigarette.  Never have "just one" cigarette. It leads to wanting another and another. Remind yourself of your decision to quit.  Change habits associated with smoking. If you smoked while driving or when feeling stressed, try other activities to replace smoking. Stand up when drinking your coffee. Brush your teeth after eating. Sit in a different chair when you read the paper. Avoid alcohol while trying to quit, and try to drink fewer caffeinated beverages. Alcohol and caffeine may urge you to smoke.  Avoid foods and drinks that can trigger a desire to smoke, such as sugary or spicy foods and alcohol.  Ask people who smoke not to smoke around you.  Have something planned to do right after eating or having a cup of coffee. For example, plan to take a walk or exercise.  Try a relaxation exercise to calm you down and decrease your stress. Remember, you may be  tense and nervous for the first 2 weeks after you quit, but this will pass.  Find new activities to keep your hands busy. Play with a pen, coin, or rubber band. Doodle or draw things on paper.  Brush your teeth right after eating. This will help cut down on the craving for the taste of tobacco after meals. You can also try mouthwash.   Use oral substitutes  in place of cigarettes. Try using lemon drops, carrots, cinnamon sticks, or chewing gum. Keep them handy so they are available when you have the urge to smoke.  When you have the urge to smoke, try deep breathing.  Designate your home as a nonsmoking area.  If you are a heavy smoker, ask your health care provider about a prescription for nicotine chewing gum. It can ease your withdrawal from nicotine.  Reward yourself. Set aside the cigarette money you save and buy yourself something nice.  Look for support from others. Join a support group or smoking cessation program. Ask someone at home or at work to help you with your plan to quit smoking.  Always ask yourself, "Do I need this cigarette or is this just a reflex?" Tell yourself, "Today, I choose not to smoke," or "I do not want to smoke." You are reminding yourself of your decision to quit.  Do not replace cigarette smoking with electronic cigarettes (commonly called e-cigarettes). The safety of e-cigarettes is unknown, and some may contain harmful chemicals.  If you relapse, do not give up! Plan ahead and think about what you will do the next time you get the urge to smoke. HOW WILL I FEEL WHEN I QUIT SMOKING? You may have symptoms of withdrawal because your body is used to nicotine (the addictive substance in cigarettes). You may crave cigarettes, be irritable, feel very hungry, cough often, get headaches, or have difficulty concentrating. The withdrawal symptoms are only temporary. They are strongest when you first quit but will go away within 10-14 days. When withdrawal symptoms occur, stay in control. Think about your reasons for quitting. Remind yourself that these are signs that your body is healing and getting used to being without cigarettes. Remember that withdrawal symptoms are easier to treat than the major diseases that smoking can cause.  Even after the withdrawal is over, expect periodic urges to smoke. However, these cravings  are generally short lived and will go away whether you smoke or not. Do not smoke! WHAT RESOURCES ARE AVAILABLE TO HELP ME QUIT SMOKING? Your health care provider can direct you to community resources or hospitals for support, which may include:  Group support.  Education.  Hypnosis.  Therapy.   This information is not intended to replace advice given to you by your health care provider. Make sure you discuss any questions you have with your health care provider.   Document Released: 05/27/2004 Document Revised: 09/19/2014 Document Reviewed: 02/14/2013 Elsevier Interactive Patient Education [employer].

## 2015-08-21 NOTE — Assessment & Plan Note (Signed)
Continue to encourage smoking cessation. 

## 2015-08-21 NOTE — Assessment & Plan Note (Signed)
ENT referral

## 2015-08-21 NOTE — Assessment & Plan Note (Signed)
Has new CPAP machine which is working well.

## 2015-08-21 NOTE — Assessment & Plan Note (Signed)
Improve glycemic control with meds, diet and exercise.  Continue metformin and Lantus and Victoza--to see Podiatry

## 2015-08-24 ENCOUNTER — Telehealth: Payer: Self-pay | Admitting: Family Medicine

## 2015-08-24 MED ORDER — INSULIN ASPART 100 UNIT/ML ~~LOC~~ SOLN: SUBCUTANEOUS | Status: DC

## 2015-08-24 NOTE — Telephone Encounter (Signed)
Pt called because his Novolog was increased to 25 units a day by Dr. Valentina Lucks but his prescription does not reflect this and his pharmacy will not fill based on what he states. jw

## 2015-08-25 NOTE — Telephone Encounter (Signed)
LMOVM for pt to return call.  Please inform that new rx was sent to [facility].     Dr.Pratt, I confirmed with Dr. Valentina Lucks that it was BID. [place], [name] [name]

## 2015-09-15 ENCOUNTER — Other Ambulatory Visit: Payer: Self-pay | Admitting: Family Medicine

## 2015-09-15 DIAGNOSIS — E1159 Type 2 diabetes mellitus with other circulatory complications: Secondary | ICD-10-CM

## 2015-09-15 DIAGNOSIS — E118 Type 2 diabetes mellitus with unspecified complications: Secondary | ICD-10-CM

## 2015-09-15 DIAGNOSIS — Z794 Long term (current) use of insulin: Secondary | ICD-10-CM

## 2015-09-15 DIAGNOSIS — E1110 Type 2 diabetes mellitus with ketoacidosis without coma: Secondary | ICD-10-CM

## 2015-09-15 MED ORDER — "INSULIN SYRINGE-NEEDLE U-100 31G X 5/16"" 1 ML MISC": 1.0000 [IU] | Status: DC

## 2015-09-15 MED ORDER — INSULIN GLARGINE 100 UNIT/ML ~~LOC~~ SOLN: 35.0000 [IU] | Freq: Two times a day (BID) | SUBCUTANEOUS | Status: DC

## 2015-09-15 MED ORDER — LIRAGLUTIDE 18 MG/3ML ~~LOC~~ SOPN: 1.8000 mg | PEN_INJECTOR | Freq: Every day | SUBCUTANEOUS | Status: DC

## 2015-09-15 NOTE — Telephone Encounter (Signed)
Needs refills on victoza, lantus and syringes.  [name] [name] on [place]

## 2015-09-16 ENCOUNTER — Ambulatory Visit (INDEPENDENT_AMBULATORY_CARE_PROVIDER_SITE_OTHER): Payer: Medicaid Other | Admitting: Gastroenterology

## 2015-09-16 ENCOUNTER — Encounter: Payer: Self-pay | Admitting: Gastroenterology

## 2015-09-16 VITALS — BP 146/80 | HR 76 | Ht 65.5 in | Wt 269.0 lb

## 2015-09-16 DIAGNOSIS — Z7901 Long term (current) use of anticoagulants: Secondary | ICD-10-CM

## 2015-09-16 DIAGNOSIS — Z1211 Encounter for screening for malignant neoplasm of colon: Secondary | ICD-10-CM

## 2015-09-16 DIAGNOSIS — I252 Old myocardial infarction: Secondary | ICD-10-CM

## 2015-09-16 NOTE — Patient Instructions (Signed)
We have sent your demographic and insurance information to [facility]. They should contact you within the next week regarding your Cologuard (colon cancer screening) test. If you have not heard from them within the next week, please call our office at [phone]. 

## 2015-09-16 NOTE — Progress Notes (Signed)
HPI :  55 y/o male seen in consultation for first time CRC screening, with history of CAD with NSTEMI in 2015 with coronary stents in place, on Effient.   No prior CRC screening. No family history of colon cancer. He denies any blood in the stools at baseline. He has some occasional loose stools, but normally he has regular formed bowel movements and does not have any bowel symptoms. No weight loss. He has not had any bleeding complications while on Effient. He denies any chest pains, but has some occasional shortness of breath, he thinks in the summer when he exerts himself. No cardiovascular symptoms at this time. He otherwise reports he is feeling well in general without specific complaints. He has questions about CRC screening options.     Past Medical History  Diagnosis Date  . Hypertension   . Hyperlipidemia   . CAD S/P percutaneous coronary angioplasty 01/18/12; 10/2013    a. pRCA 3.5 x 18 vision BMS - 4.2 mm; b. 2/'15: mCx 3.5 x 12  Rebel BMS (3.6-3.7 mm)  . GERD (gastroesophageal reflux disease)   . Type II diabetes mellitus ([place])   . Chronic back pain     "mid/lower" (08/01/2014)  . Depression   . Non-ST elevation myocardial infarction (NSTEMI) ([place]) 10/2013  . COPD (chronic obstructive pulmonary disease) ([place])     "I'm seeing COPD dr now; don't know if I've got it" (08/01/2014)  . OSA on CPAP 01/19/2012  . Migraines     "once in awhile" (08/01/2014)  . Arthritis     "qwhere"  . Alcoholism ([place])   . Anxiety   . Asthma      Past Surgical History  Procedure Laterality Date  . Coronary angioplasty with stent placement  01/2012; 11/07/2013; 08/01/2014    "1 + 2 + 1"   . Cardiac catheterization  07/2014    Left Main: Short, large-caliber vessel. Widely patent. Bifurcates into the LAD and Circumflex. Angiographically normal.  . Coronary angioplasty with stent placement  07/2014    Severe single-vessel disease involving the bifurcation of OM1 and OM 2 with 99% in-stent restenosis  in the bare-metal stent placed in the OM 2.  . Left heart catheterization with coronary angiogram N/A 01/20/2012    Procedure: LEFT HEART CATHETERIZATION WITH CORONARY ANGIOGRAM;  Surgeon: Jettie Booze, MD;  Location: [facility] CATH LAB;  Service: Cardiovascular;  Laterality: N/A;  . Percutaneous coronary stent intervention (pci-s)  01/20/2012    Procedure: PERCUTANEOUS CORONARY STENT INTERVENTION (PCI-S);  Surgeon: Jettie Booze, MD;  Location: [facility] CATH LAB;  Service: Cardiovascular;;  . Left heart catheterization with coronary angiogram N/A 11/07/2013    Procedure: LEFT HEART CATHETERIZATION WITH CORONARY ANGIOGRAM;  Surgeon: Jettie Booze, MD;  Location: [facility] CATH LAB;  Service: Cardiovascular;  Laterality: N/A;  . Percutaneous coronary stent intervention (pci-s)  11/07/2013    Procedure: PERCUTANEOUS CORONARY STENT INTERVENTION (PCI-S);  Surgeon: Jettie Booze, MD;  Location: [facility] CATH LAB;  Service: Cardiovascular;;  . Left heart catheterization with coronary angiogram N/A 08/01/2014    Procedure: LEFT HEART CATHETERIZATION WITH CORONARY ANGIOGRAM;  Surgeon: Leonie Man, MD;  Location: [facility] CATH LAB;  Service: Cardiovascular;  Laterality: N/A;   Family History  Problem Relation Age of Onset  . Heart attack Father 14  . Heart attack Brother   . Stroke Brother   . Breast cancer Mother   . Hypertension Sister   . Hyperlipidemia Sister   . Hyperlipidemia Sister   .  Hypertension Sister   . Diabetes Brother   . Diabetes Sister    Social History  Substance Use Topics  . Smoking status: Current Some Day Smoker -- 0.50 packs/day for 37 years    Types: Cigarettes    Last Attempt to Quit: 12/18/2014  . Smokeless tobacco: Never Used  . Alcohol Use: 3.6 oz/week    6 Cans of beer per week     Comment: 08/01/2014 "8-12 beers once/ week"   Current Outpatient Prescriptions  Medication Sig Dispense Refill  . ACCU-CHEK FASTCLIX LANCETS MISC 1 Units by Percutaneous route 4 (four)  times daily. 100 each 0  . ACCU-CHEK FASTCLIX LANCETS MISC 1 Units by Percutaneous route 4 (four) times daily. 100 each 12  . albuterol (PROVENTIL HFA;VENTOLIN HFA) 108 (90 BASE) MCG/ACT inhaler Inhale 1-2 puffs into the lungs every 6 (six) hours as needed for wheezing or shortness of breath. 1 Inhaler 0  . aspirin 81 MG chewable tablet Chew 1 tablet (81 mg total) by mouth daily. 180 tablet 2  . Blood Glucose Monitoring Suppl (ACCU-CHEK NANO SMARTVIEW) W/DEVICE KIT 1 Units by Does not apply route as directed. Check fasting, and two hours after breakfast, lunch and dinner 1 kit 0  . carvedilol (COREG) 3.125 MG tablet Take 1 tablet (3.125 mg total) by mouth 2 (two) times daily with a meal. 60 tablet 3  . clotrimazole (LOTRIMIN) 1 % cream Apply to affected area 2 times daily 15 g 0  . desoximetasone (TOPICORT) 0.25 % cream Apply 1 application topically 2 (two) times daily. 30 g 3  . glucose blood (ACCU-CHEK SMARTVIEW) test strip Check blood sugars 4x/daily 100 each 12  . insulin aspart (NOVOLOG) 100 UNIT/ML injection INJECT 25 UNITS INTO THE SKIN TWICE DAILY WITH MEALS 10 mL 5  . insulin glargine (LANTUS) 100 UNIT/ML injection Inject 0.35 mLs (35 Units total) into the skin 2 (two) times daily. 20 mL 3  . Insulin Pen Needle (BD ULTRA-FINE PEN NEEDLES) 29G X 12.7MM MISC Use to with Victoza Pen, injection once daily. 100 each 11  . Insulin Syringe-Needle U-100 (FREESTYLE PRECISION INS SYR) 31G X 5/16" 1 ML MISC 1 Units by Does not apply route as directed. Dx: Diabetes 250.02.  Dispense:  QS for 5 shots daily - 1 month supply 100 each 0  . Liraglutide 18 MG/3ML SOPN Inject 0.3 mLs (1.8 mg total) into the skin daily. Dispense QS for 1 month supply 3 pen 11  . lisinopril (PRINIVIL,ZESTRIL) 5 MG tablet TAKE 0.5 TABLETS (2.5 MG TOTAL) BY MOUTH 2 (TWO) TIMES DAILY. 60 tablet 11  . metFORMIN (GLUCOPHAGE) 1000 MG tablet Take 1 tablet (1,000 mg total) by mouth 2 (two) times daily with a meal. 60 tablet 11  .  mometasone-formoterol (DULERA) 200-5 MCG/ACT AERO Inhale 2 puffs into the lungs 2 (two) times daily. 1 Inhaler 0  . nitroGLYCERIN (NITROSTAT) 0.4 MG SL tablet Place 1 tablet (0.4 mg total) under the tongue every 5 (five) minutes x 3 doses as needed for chest pain. 100 tablet 3  . Omega-3 Fatty Acids (FISH OIL PO) Take 1 capsule by mouth 2 (two) times daily.     . prasugrel (EFFIENT) 10 MG TABS tablet Take 1 tablet (10 mg total) by mouth daily. 30 tablet 6  . rosuvastatin (CRESTOR) 20 MG tablet Take 1 tablet (20 mg total) by mouth daily. 90 tablet 3  . traMADol (ULTRAM) 50 MG tablet TAKE 1 TABLET BY MOUTH EVERY 6 HOURS AS NEEDED 10 tablet 0  No current facility-administered medications for this visit.   Allergies  Allergen Reactions  . Shellfish Allergy Nausea And Vomiting     Review of Systems: All systems reviewed and negative except where noted in HPI.   Lab Results  Component Value Date   WBC 12.5* 08/12/2015   HGB 13.7 08/12/2015   HCT 40.0 08/12/2015   MCV 89.9 08/12/2015   PLT 218 08/12/2015    Lab Results  Component Value Date   CREATININE 0.70 08/12/2015   BUN 16 08/12/2015   NA 136 08/12/2015   K 3.6 08/12/2015   CL 103 08/12/2015   CO2 25 08/12/2015    Lab Results  Component Value Date   ALT 14 02/19/2015   AST 14 02/19/2015   ALKPHOS 108 02/19/2015   BILITOT 0.4 02/19/2015     Physical Exam: BP 146/80 mmHg  Pulse 76  Ht 5' 5.5" (1.664 m)  Wt 269 lb (122.018 kg)  BMI 44.07 kg/m2 Constitutional: Pleasant,well-developed, male in no acute distress. HEENT: Normocephalic and atraumatic. Conjunctivae are normal. No scleral icterus. Neck supple.  Cardiovascular: Normal rate, regular rhythm.  Pulmonary/chest: Effort normal and breath sounds normal. No wheezing, rales or rhonchi. Abdominal: Soft, nondistended, protuberant, nontender. Bowel sounds active throughout. There are no masses palpable. No hepatomegaly. Extremities: no edema Lymphadenopathy: No  cervical adenopathy noted. Neurological: Alert and oriented to person place and time. Skin: Skin is warm and dry. No rashes noted. Psychiatric: Normal mood and affect. Behavior is normal.   ASSESSMENT AND PLAN: 55 y/o male here for CRC screening. No FH of CRC, no anemia, no bowel habits changes, no alarm symptoms. He has a history of CAD with multiple coronary stents from an NSTEMI in 2015.  He has had no prior CRC screening.   We discussed screening modalities for colon cancer to include optical colonoscopy, virtual colonoscopy, and a variety of stool testing options. If we proceed with colonoscopy he will have to hold Effient for 5-7 days prior to the procedure to minimize the risk of bleeding if polypectomy is performed. There is increased risk for stent thrombosis and MI when Effient is held, although the risk would thought to be low. After discussion of options for screening, he wishes to avoid a colonoscopy and avoid holding Effient if at all possible. In this light he wished to proceed with Cologuard testing. If this is positive he understands, we will recommend a colonoscopy. If negative, he is okay for screening purposes for 3 years. He verbalized understanding and wished to proceed with Cologuard testing, we will contact him with the results.   Vincent Cellar, MD [facility] Pager [phone]   CC:  [name], MD

## 2015-09-18 ENCOUNTER — Encounter: Payer: Self-pay | Admitting: Cardiology

## 2015-09-18 ENCOUNTER — Ambulatory Visit (INDEPENDENT_AMBULATORY_CARE_PROVIDER_SITE_OTHER): Payer: Medicaid Other | Admitting: Cardiology

## 2015-09-18 VITALS — BP 134/80 | HR 88 | Ht 65.5 in | Wt 271.3 lb

## 2015-09-18 DIAGNOSIS — J449 Chronic obstructive pulmonary disease, unspecified: Secondary | ICD-10-CM

## 2015-09-18 DIAGNOSIS — E785 Hyperlipidemia, unspecified: Secondary | ICD-10-CM

## 2015-09-18 DIAGNOSIS — Z720 Tobacco use: Secondary | ICD-10-CM

## 2015-09-18 DIAGNOSIS — I10 Essential (primary) hypertension: Secondary | ICD-10-CM | POA: Diagnosis not present

## 2015-09-18 DIAGNOSIS — Z9861 Coronary angioplasty status: Secondary | ICD-10-CM

## 2015-09-18 DIAGNOSIS — I2510 Atherosclerotic heart disease of native coronary artery without angina pectoris: Secondary | ICD-10-CM

## 2015-09-18 DIAGNOSIS — R0602 Shortness of breath: Secondary | ICD-10-CM

## 2015-09-18 DIAGNOSIS — J441 Chronic obstructive pulmonary disease with (acute) exacerbation: Secondary | ICD-10-CM

## 2015-09-18 MED ORDER — MOMETASONE FURO-FORMOTEROL FUM 200-5 MCG/ACT IN AERO: 2.0000 | INHALATION_SPRAY | Freq: Two times a day (BID) | RESPIRATORY_TRACT | Status: DC

## 2015-09-18 NOTE — Progress Notes (Signed)
09/18/2015 [name] [name]   August 28, 1961  [id number]  Primary Physician [name], MD Primary Cardiologist: [name]  HPI:  [name] [name] is seen today for follow up CAD. His past medical history is significant for CAD, DM, obesity, tobacco use , hyperlipidemia and hypertension. He is status post stenting of the right coronary artery in May of 2013. He was admitted with a NSTEMI inFebruary of 2015. He was found to have severe disease in LCx and OM2 treated with BMS.  He was placed on dual antiplatelet therapy with aspirin plus Effient.   He presented with increased angina in October 2015. A Myoview study was intermediate risk.  He had repeat cardiac cath in November 2015.  He was found to have severe single-vessel disease involving the bifurcation of OM1 and OM 2 with 99% in-stent restenosis in the bare-metal stent placed in the OM 2. He underwent successful PCI spanning the proximal circumflex stent across OM 1 through the bare-metal stent placed in OM 2 with a Xience alpine DES stent. He was also noted to have a widely patent stent in the RCA and proximal circumflex with otherwise mild-moderate disease. He has preserved LVEF with an ejection fraction of 55-65%. He was instructed to continue with DAPT with ASA + Effient.   On follow up today he reports he is doing very well. No chest pain or SOB. He is trying to quit smoking. Diabetes control is suboptimal  with last A1c 8.8%. He has gained  9 lbs but attributes this to wearing heavy clothes.    Current Outpatient Prescriptions  Medication Sig Dispense Refill  . ACCU-CHEK FASTCLIX LANCETS MISC 1 Units by Percutaneous route 4 (four) times daily. 100 each 12  . albuterol (PROVENTIL HFA;VENTOLIN HFA) 108 (90 BASE) MCG/ACT inhaler Inhale 1-2 puffs into the lungs every 6 (six) hours as needed for wheezing or shortness of breath. 1 Inhaler 0  . aspirin 81 MG chewable tablet Chew 1 tablet (81 mg total) by mouth daily. 180 tablet 2  . Blood Glucose  Monitoring Suppl (ACCU-CHEK NANO SMARTVIEW) W/DEVICE KIT 1 Units by Does not apply route as directed. Check fasting, and two hours after breakfast, lunch and dinner 1 kit 0  . carvedilol (COREG) 3.125 MG tablet Take 1 tablet (3.125 mg total) by mouth 2 (two) times daily with a meal. 60 tablet 3  . glucose blood (ACCU-CHEK SMARTVIEW) test strip Check blood sugars 4x/daily 100 each 12  . insulin aspart (NOVOLOG) 100 UNIT/ML injection INJECT 25 UNITS INTO THE SKIN TWICE DAILY WITH MEALS 10 mL 5  . insulin glargine (LANTUS) 100 UNIT/ML injection Inject 0.35 mLs (35 Units total) into the skin 2 (two) times daily. 20 mL 3  . Insulin Pen Needle (BD ULTRA-FINE PEN NEEDLES) 29G X 12.7MM MISC Use to with Victoza Pen, injection once daily. 100 each 11  . Insulin Syringe-Needle U-100 (FREESTYLE PRECISION INS SYR) 31G X 5/16" 1 ML MISC 1 Units by Does not apply route as directed. Dx: Diabetes 250.02.  Dispense:  QS for 5 shots daily - 1 month supply 100 each 0  . Liraglutide 18 MG/3ML SOPN Inject 0.3 mLs (1.8 mg total) into the skin daily. Dispense QS for 1 month supply 3 pen 11  . lisinopril (PRINIVIL,ZESTRIL) 5 MG tablet TAKE 0.5 TABLETS (2.5 MG TOTAL) BY MOUTH 2 (TWO) TIMES DAILY. 60 tablet 11  . metFORMIN (GLUCOPHAGE) 1000 MG tablet Take 1 tablet (1,000 mg total) by mouth 2 (two) times daily with a meal. 60 tablet  11  . mometasone-formoterol (DULERA) 200-5 MCG/ACT AERO Inhale 2 puffs into the lungs 2 (two) times daily. 1 Inhaler 3  . nitroGLYCERIN (NITROSTAT) 0.4 MG SL tablet Place 1 tablet (0.4 mg total) under the tongue every 5 (five) minutes x 3 doses as needed for chest pain. 100 tablet 3  . Omega-3 Fatty Acids (FISH OIL PO) Take 1 capsule by mouth 2 (two) times daily.     . prasugrel (EFFIENT) 10 MG TABS tablet Take 1 tablet (10 mg total) by mouth daily. 30 tablet 6  . rosuvastatin (CRESTOR) 20 MG tablet Take 1 tablet (20 mg total) by mouth daily. 90 tablet 3  . traMADol (ULTRAM) 50 MG tablet TAKE 1  TABLET BY MOUTH EVERY 6 HOURS AS NEEDED 10 tablet 0   No current facility-administered medications for this visit.    Allergies  Allergen Reactions  . Shellfish Allergy Nausea And Vomiting    Social History   Social History  . Marital Status: Single    Spouse Name: N/A  . Number of Children: 0  . Years of Education: 15   Occupational History  . Unemployed     Used to work for [employer]  . Student, computer tech [employer]    Social History Main Topics  . Smoking status: Current Some Day Smoker -- 0.50 packs/day for 37 years    Types: Cigarettes    Last Attempt to Quit: 12/18/2014  . Smokeless tobacco: Never Used  . Alcohol Use: 3.6 oz/week    6 Cans of beer per week     Comment: 08/01/2014 "8-12 beers once/ week"  . Drug Use: Yes     Comment: Crack Cocaine   . Sexual Activity: Not Currently   Other Topics Concern  . Not on file   Social History Narrative   Single.  Lives with a roommate.  Ambulates independently.     Review of Systems: AS noted in HPI All other systems reviewed and are otherwise negative except as noted above.    Blood pressure 134/80, pulse 88, height 5' 5.5" (1.664 m), weight 123.061 kg (271 lb 4.8 oz).  General appearance: alert, obese, cooperative and no distress Neck: no carotid bruit and no JVD Lungs: clear to auscultation bilaterally Heart: regular rate and rhythm, S1, S2 normal, no murmur, click, rub or gallop Extremities: no LEE Pulses: 2+ and symmetric Skin: warm and dry Neurologic: Grossly normal    ASSESSMENT AND PLAN:   1. CAD: Status post repeat PCI to the proximal circumflex and OM 2 in Nov. 2015 as outlined above with DES for instent restenosis. He denies any recurrent anginal symptoms.  Continue dual antiplatelet therapy with aspirin plus Effient indefinitely due to extensive nature of stents.  Continue beta blocker, statin and ACE inhibitor for secondary prevention.  2. Hypertension: Blood pressure is  well-controlled. Continue lisinopril and atenolol.  3. Hyperlipidemia: on statin  4. Diabetes: per primary care. Needs to lose weight.  5. Tobacco abuse: Smoking cessation was strongly advised. Patient was educated on the associated risks between tobacco use and cardiovascular disease.  I will follow up in 6 months.  Thane Age Swaz[place] MD,FACC  09/18/2015 5:10 PM

## 2015-09-18 NOTE — Patient Instructions (Signed)
Continue your current therapy  You need to quit smoking completely  Try and lose some weight  I will see you in 6 months.

## 2015-09-24 ENCOUNTER — Other Ambulatory Visit: Payer: Self-pay | Admitting: Cardiology

## 2015-09-24 MED ORDER — PRASUGREL HCL 10 MG PO TABS: 10.0000 mg | ORAL_TABLET | Freq: Every day | ORAL | Status: DC

## 2015-09-26 LAB — COLOGUARD

## 2015-10-02 ENCOUNTER — Other Ambulatory Visit: Payer: Self-pay

## 2015-10-02 ENCOUNTER — Telehealth: Payer: Self-pay | Admitting: Gastroenterology

## 2015-10-02 DIAGNOSIS — R0602 Shortness of breath: Secondary | ICD-10-CM

## 2015-10-02 DIAGNOSIS — J441 Chronic obstructive pulmonary disease with (acute) exacerbation: Secondary | ICD-10-CM

## 2015-10-02 MED ORDER — MOMETASONE FURO-FORMOTEROL FUM 200-5 MCG/ACT IN AERO: 2.0000 | INHALATION_SPRAY | Freq: Two times a day (BID) | RESPIRATORY_TRACT | Status: DC

## 2015-10-02 NOTE — Telephone Encounter (Signed)
[name] [name] can you please let the patient know I received the results of his Cologuard testing. It could not be processed as he returned an empty collection kit without stool. Please inform him he needs to place a stool sample in the kit to run the test and coordinate another exam for him if he is willing to do it. Thanks

## 2015-10-02 NOTE — Telephone Encounter (Signed)
Left a message for patient to call back. 

## 2015-10-02 NOTE — Telephone Encounter (Signed)
Rx(s) sent to pharmacy electronically.  

## 2015-10-02 NOTE — Telephone Encounter (Signed)
Spoke with patient and he did not receive a kit. Verified his address and it should say [address] also.

## 2015-10-05 NOTE — Telephone Encounter (Signed)
Re-ordered the Cologuard through [employer] site. Staff message to myself to check the progress on 10/12/15.

## 2015-10-08 ENCOUNTER — Other Ambulatory Visit: Payer: Self-pay | Admitting: Family Medicine

## 2015-10-09 NOTE — Telephone Encounter (Signed)
error 

## 2015-10-09 NOTE — Telephone Encounter (Signed)
2nd request.  Martin, Tamika L, RN  

## 2015-10-13 ENCOUNTER — Encounter: Payer: Self-pay | Admitting: Gastroenterology

## 2015-10-21 ENCOUNTER — Ambulatory Visit (INDEPENDENT_AMBULATORY_CARE_PROVIDER_SITE_OTHER): Payer: Medicaid Other | Admitting: Podiatry

## 2015-10-21 ENCOUNTER — Encounter: Payer: Self-pay | Admitting: Podiatry

## 2015-10-21 VITALS — BP 115/64 | HR 68 | Resp 12

## 2015-10-21 DIAGNOSIS — M79676 Pain in unspecified toe(s): Secondary | ICD-10-CM

## 2015-10-21 DIAGNOSIS — B351 Tinea unguium: Secondary | ICD-10-CM | POA: Diagnosis not present

## 2015-10-21 DIAGNOSIS — E119 Type 2 diabetes mellitus without complications: Secondary | ICD-10-CM

## 2015-10-21 NOTE — Patient Instructions (Signed)
A your diabetic foot examination demonstrated adequate pulsation and feelings in your feet There were some occasional red dots in your feet that may be consistent with sweat retention If these red dots persists consult with a dermatologist Return as needed or yearly for diabetic foot examination  Diabetes and Foot Care Diabetes may cause you to have problems because of poor blood supply (circulation) to your feet and legs. This may cause the skin on your feet to become thinner, break easier, and heal more slowly. Your skin may become dry, and the skin may peel and crack. You may also have nerve damage in your legs and feet causing decreased feeling in them. You may not notice minor injuries to your feet that could lead to infections or more serious problems. Taking care of your feet is one of the most important things you can do for yourself.  HOME CARE INSTRUCTIONS  Wear shoes at all times, even in the house. Do not go barefoot. Bare feet are easily injured.  Check your feet daily for blisters, cuts, and redness. If you cannot see the bottom of your feet, use a mirror or ask someone for help.  Wash your feet with warm water (do not use hot water) and mild soap. Then pat your feet and the areas between your toes until they are completely dry. Do not soak your feet as this can dry your skin.  Apply a moisturizing lotion or petroleum jelly (that does not contain alcohol and is unscented) to the skin on your feet and to dry, brittle toenails. Do not apply lotion between your toes.  Trim your toenails straight across. Do not dig under them or around the cuticle. File the edges of your nails with an emery board or nail file.  Do not cut corns or calluses or try to remove them with medicine.  Wear clean socks or stockings every day. Make sure they are not too tight. Do not wear knee-high stockings since they may decrease blood flow to your legs.  Wear shoes that fit properly and have enough  cushioning. To break in new shoes, wear them for just a few hours a day. This prevents you from injuring your feet. Always look in your shoes before you put them on to be sure there are no objects inside.  Do not cross your legs. This may decrease the blood flow to your feet.  If you find a minor scrape, cut, or break in the skin on your feet, keep it and the skin around it clean and dry. These areas may be cleansed with mild soap and water. Do not cleanse the area with peroxide, alcohol, or iodine.  When you remove an adhesive bandage, be sure not to damage the skin around it.  If you have a wound, look at it several times a day to make sure it is healing.  Do not use heating pads or hot water bottles. They may burn your skin. If you have lost feeling in your feet or legs, you may not know it is happening until it is too late.  Make sure your health care provider performs a complete foot exam at least annually or more often if you have foot problems. Report any cuts, sores, or bruises to your health care provider immediately. SEEK MEDICAL CARE IF:   You have an injury that is not healing.  You have cuts or breaks in the skin.  You have an ingrown nail.  You notice redness on your legs  or feet.  You feel burning or tingling in your legs or feet.  You have pain or cramps in your legs and feet.  Your legs or feet are numb.  Your feet always feel cold. SEEK IMMEDIATE MEDICAL CARE IF:   There is increasing redness, swelling, or pain in or around a wound.  There is a red line that goes up your leg.  Pus is coming from a wound.  You develop a fever or as directed by your health care provider.  You notice a bad smell coming from an ulcer or wound.   This information is not intended to replace advice given to you by your health care provider. Make sure you discuss any questions you have with your health care provider.   Document Released: 08/26/2000 Document Revised: 05/01/2013  Document Reviewed: 02/05/2013 Elsevier Interactive Patient Education [employer].

## 2015-10-21 NOTE — Progress Notes (Signed)
   Subjective:    Patient ID: [name] [name], male    DOB: 08/05/1961, 55 y.o.   MRN: [id number]  HPI    This patient presents today with approximately two-month history of occasionally tingling and burning in his right and left feet on and off weightbearing and most noticeable after walking. The tingling burning does benefit from falling asleep or staying asleep or reduce the amount of standing and walking. He had no specific treatment for these problems.  Patient also requesting debridement of toenails which he said been trimmed by his roommate in the past  Patient state he is a diabetic for several years denies any history of foot ulceration, claudication or amputation  Patient states that he is unemployed not working for multiple years due to multiple health problems  Review of Systems  Respiratory: Positive for cough, shortness of breath and wheezing.   Psychiatric/Behavioral: The patient is nervous/anxious.        Objective:   Physical Exam   Orientated 3  Vascular: DP and PT pulses 2/4 bilaterally Capillary reflex immediate bilaterally  Neurological: Sensation to 10 g monofilament wire intact 5/5 bilaterally Vibratory sensation reactive bilaterally Ankle reflex equal and reactive bilaterally  Dermatological: Dry crusting eschar distal right hallux and second right toes.  Red punctate lesions medial left hallux area The nails are elongated discolored and with occasional texture and deformity  Musculoskeletal: No deformities noted bilaterally There is no restriction ankle, subtalar, midtarsal joints bilateral       Assessment & Plan:   Assessment: Satisfactory neurovascular status Red punctate lesions may be consistent with hyperhidrosis or dyshidrosis, however, are asymptomatic Mycotic toenails  Plan: Today review the results of examination with patient today made aware they had satisfactory neurovascular status from a diabetic screen point. At this time the  burning is only occasional I did not recommend any active treatment I made patient aware of February knots become more prominent or increase to consult a dermatologist  Reappoint as needed or yearly for diabetic foot examination

## 2015-10-22 ENCOUNTER — Telehealth: Payer: Self-pay | Admitting: Cardiology

## 2015-10-22 NOTE — Telephone Encounter (Signed)
New message  Patient calling the office for samples of medication:   1.  What medication and dosage are you requesting samples for? prasugrel (EFFIENT) 10 MG TABS tablet  2.  Are you currently out of this medication? Yes

## 2015-10-22 NOTE — Telephone Encounter (Signed)
Samples at front desk, informed pt via VM.

## 2015-10-27 ENCOUNTER — Other Ambulatory Visit: Payer: Self-pay | Admitting: Family Medicine

## 2015-11-16 ENCOUNTER — Other Ambulatory Visit: Payer: Self-pay | Admitting: *Deleted

## 2015-11-16 MED ORDER — TRAMADOL HCL 50 MG PO TABS: 50.0000 mg | ORAL_TABLET | Freq: Four times a day (QID) | ORAL | Status: DC | PRN

## 2015-11-16 NOTE — Telephone Encounter (Signed)
Medication called into pharmacy and they will contact patient when this is ready for pick up. Jazmin Hartsell,CMA

## 2015-11-25 ENCOUNTER — Telehealth: Payer: Self-pay | Admitting: Gastroenterology

## 2015-11-25 ENCOUNTER — Encounter: Payer: Self-pay | Admitting: Family Medicine

## 2015-11-25 ENCOUNTER — Ambulatory Visit (INDEPENDENT_AMBULATORY_CARE_PROVIDER_SITE_OTHER): Payer: Medicaid Other | Admitting: Family Medicine

## 2015-11-25 VITALS — BP 113/67 | HR 82 | Temp 98.3°F | Ht 66.0 in | Wt 271.0 lb

## 2015-11-25 DIAGNOSIS — E1310 Other specified diabetes mellitus with ketoacidosis without coma: Secondary | ICD-10-CM

## 2015-11-25 DIAGNOSIS — Z794 Long term (current) use of insulin: Secondary | ICD-10-CM | POA: Diagnosis not present

## 2015-11-25 DIAGNOSIS — L84 Corns and callosities: Secondary | ICD-10-CM | POA: Diagnosis not present

## 2015-11-25 DIAGNOSIS — Z9861 Coronary angioplasty status: Secondary | ICD-10-CM

## 2015-11-25 DIAGNOSIS — E1159 Type 2 diabetes mellitus with other circulatory complications: Secondary | ICD-10-CM

## 2015-11-25 DIAGNOSIS — I10 Essential (primary) hypertension: Secondary | ICD-10-CM | POA: Diagnosis not present

## 2015-11-25 DIAGNOSIS — B353 Tinea pedis: Secondary | ICD-10-CM

## 2015-11-25 DIAGNOSIS — I2510 Atherosclerotic heart disease of native coronary artery without angina pectoris: Secondary | ICD-10-CM

## 2015-11-25 DIAGNOSIS — E1110 Type 2 diabetes mellitus with ketoacidosis without coma: Secondary | ICD-10-CM

## 2015-11-25 LAB — POCT GLYCOSYLATED HEMOGLOBIN (HGB A1C): Hemoglobin A1C: 8.5

## 2015-11-25 MED ORDER — MICONAZOLE NITRATE 2 % EX POWD: CUTANEOUS | Status: DC | PRN

## 2015-11-25 MED ORDER — LIRAGLUTIDE 18 MG/3ML ~~LOC~~ SOPN: 1.8000 mg | PEN_INJECTOR | Freq: Every day | SUBCUTANEOUS | Status: DC

## 2015-11-25 NOTE — Progress Notes (Signed)
    Subjective:    Patient ID: [name] [name] is a 55 y.o. male presenting with Diabetes  on 11/25/2015  HPI: Needs refill of his Victoza.  He has some rash with burning and tingling of his right foot. Has seen Podiatry. Walks barefoot a lot Continues to work on disability. He is begging on the street.  Review of Systems  Constitutional: Negative for fever and chills.  Respiratory: Negative for shortness of breath.   Cardiovascular: Negative for leg swelling.  Gastrointestinal: Negative for nausea, vomiting and abdominal pain.      Objective:    BP 113/67 mmHg  Pulse 82  Temp(Src) 98.3 F (36.8 C) (Oral)  Ht 5\' 6"  (1.676 m)  Wt 271 lb (122.925 kg)  BMI 43.76 kg/m2 Physical Exam  Constitutional: He appears well-developed and well-nourished. No distress.  HENT:  Head: Normocephalic and atraumatic.  Eyes: No scleral icterus.  Neck: Neck supple.  Cardiovascular: Normal rate.   Pulmonary/Chest:  Unable to speak in full sentences.  Abdominal: Soft.  Musculoskeletal: He exhibits no edema.  Blackened area under toes, peeling skin, warm foot with good pulses.  Neurological: He is alert.  Skin: Skin is warm.  Psychiatric: He has a normal mood and affect.  Vitals reviewed.   HgbA1C 8.5    Assessment & Plan:   Problem List Items Addressed This Visit      Medium   Essential hypertension (Chronic)    BP at goal, continue coreg, lisinopril      CAD S/P percutaneous coronary angioplasty (Chronic)    Stable for now--continue current ASA, Lipitor, coreg, NTG      DM type 2 (diabetes mellitus, type 2) ([place]) - Primary    A1C is showing small improvements. Continue to work on diet and exercise and continue Victoza and Lantus and novolog.      Relevant Medications   Liraglutide 18 MG/3ML SOPN   Other Relevant Orders   POCT A1C (Completed)     Unprioritized   Pre-ulcerative corn or callous    Not helping his tinea       Other Visit Diagnoses    Uncontrolled type 2  diabetes mellitus with ketoacidosis without coma, with long-term current use of insulin (HCC)        Relevant Medications    Liraglutide 18 MG/3ML SOPN    Tinea pedis, right        topical anti-fungal, wear shower shoes, socks, change shoes.    Relevant Medications    miconazole (MICOTIN) 2 % powder       Total face-to-face time with patient: 25 minutes. Over 50% of encounter was spent on counseling and coordination of care. Return in about 3 months (around 02/25/2016).  Journiee Feldkamp S 11/25/2015 1:43 PM

## 2015-11-25 NOTE — Assessment & Plan Note (Signed)
BP at goal, continue coreg, lisinopril

## 2015-11-25 NOTE — Telephone Encounter (Signed)
Spoke with [employer] and patient refused delivery of kit. They will send another. Called and left a message for patient that they will be sending another kit and he should accept the package or go to the post office to pick it up if they leave a note.

## 2015-11-25 NOTE — Assessment & Plan Note (Signed)
Not helping his tinea

## 2015-11-25 NOTE — Patient Instructions (Addendum)
Athlete's Foot Athlete's foot (tinea pedis) is a fungal infection of the skin on the feet. It often occurs on the skin between the toes or underneath the toes. It can also occur on the soles of the feet. Athlete's foot is more likely to occur in hot, humid weather. Not washing your feet or changing your socks often enough can contribute to athlete's foot. The infection can spread from person to person (contagious). CAUSES Athlete's foot is caused by a fungus. This fungus thrives in warm, moist places. Most people get athlete's foot by sharing shower stalls, towels, and wet floors with an infected person. People with weakened immune systems, including those with diabetes, may be more likely to get athlete's foot. SYMPTOMS   Itchy areas between the toes or on the soles of the feet.  White, flaky, or scaly areas between the toes or on the soles of the feet.  Tiny, intensely itchy blisters between the toes or on the soles of the feet.  Tiny cuts on the skin. These cuts can develop a bacterial infection.  Thick or discolored toenails. DIAGNOSIS  Your caregiver can usually tell what the problem is by doing a physical exam. Your caregiver may also take a skin sample from the rash area. The skin sample may be examined under a microscope, or it may be tested to see if fungus will grow in the sample. A sample may also be taken from your toenail for testing. TREATMENT  Over-the-counter and prescription medicines can be used to kill the fungus. These medicines are available as powders or creams. Your caregiver can suggest medicines for you. Fungal infections respond slowly to treatment. You may need to continue using your medicine for several weeks. PREVENTION   Do not share towels.  Wear sandals in wet areas, such as shared locker rooms and shared showers.  Keep your feet dry. Wear shoes that allow air to circulate. Wear cotton or wool socks. HOME CARE INSTRUCTIONS   Take medicines as directed by  your caregiver. Do not use steroid creams on athlete's foot.  Keep your feet clean and cool. Wash your feet daily and dry them thoroughly, especially between your toes.  Change your socks every day. Wear cotton or wool socks. In hot climates, you may need to change your socks 2 to 3 times per day.  Wear sandals or canvas tennis shoes with good air circulation.  If you have blisters, soak your feet in Burow's solution or Epsom salts for 20 to 30 minutes, 2 times a day to dry out the blisters. Make sure you dry your feet thoroughly afterward. SEEK MEDICAL CARE IF:   You have a fever.  You have swelling, soreness, warmth, or redness in your foot.  You are not getting better after 7 days of treatment.  You are not completely cured after 30 days.  You have any problems caused by your medicines. MAKE SURE YOU:   Understand these instructions.  Will watch your condition.  Will get help right away if you are not doing well or get worse.   This information is not intended to replace advice given to you by your health care provider. Make sure you discuss any questions you have with your health care provider.   Document Released: 08/26/2000 Document Revised: 11/21/2011 Document Reviewed: 03/02/2015 Elsevier Interactive Patient Education 2016 [place] Carbohydrate Counting for Diabetes Mellitus Carbohydrate counting is a method for keeping track of the amount of carbohydrates you eat. Eating carbohydrates naturally increases the level  of sugar (glucose) in your blood, so it is important for you to know the amount that is okay for you to have in every meal. Carbohydrate counting helps keep the level of glucose in your blood within normal limits. The amount of carbohydrates allowed is different for every person. A dietitian can help you calculate the amount that is right for you. Once you know the amount of carbohydrates you can have, you can count the carbohydrates in the foods you  want to eat. Carbohydrates are found in the following foods:  Grains, such as breads and cereals.  Dried beans and soy products.  Starchy vegetables, such as potatoes, peas, and corn.  Fruit and fruit juices.  Milk and yogurt.  Sweets and snack foods, such as cake, cookies, candy, chips, soft drinks, and fruit drinks. CARBOHYDRATE COUNTING There are two ways to count the carbohydrates in your food. You can use either of the methods or a combination of both. Reading the "Nutrition Facts" on [place] The "Nutrition Facts" is an area that is included on the labels of almost all packaged food and beverages in the [place]. It includes the serving size of that food or beverage and information about the nutrients in each serving of the food, including the grams (g) of carbohydrate per serving.  Decide the number of servings of this food or beverage that you will be able to eat or drink. Multiply that number of servings by the number of grams of carbohydrate that is listed on the label for that serving. The total will be the amount of carbohydrates you will be having when you eat or drink this food or beverage. Learning Standard Serving Sizes of Food When you eat food that is not packaged or does not include "Nutrition Facts" on the label, you need to measure the servings in order to count the amount of carbohydrates.A serving of most carbohydrate-rich foods contains about 15 g of carbohydrates. The following list includes serving sizes of carbohydrate-rich foods that provide 15 g ofcarbohydrate per serving:   1 slice of bread (1 oz) or 1 six-inch tortilla.    of a hamburger bun or English muffin.  4-6 crackers.   cup unsweetened dry cereal.    cup hot cereal.   cup rice or pasta.    cup mashed potatoes or  of a large baked potato.  1 cup fresh fruit or one small piece of fruit.    cup canned or frozen fruit or fruit juice.  1 cup milk.   cup plain fat-free  yogurt or yogurt sweetened with artificial sweeteners.   cup cooked dried beans or starchy vegetable, such as peas, corn, or potatoes.  Decide the number of standard-size servings that you will eat. Multiply that number of servings by 15 (the grams of carbohydrates in that serving). For example, if you eat 2 cups of strawberries, you will have eaten 2 servings and 30 g of carbohydrates (2 servings x 15 g = 30 g). For foods such as soups and casseroles, in which more than one food is mixed in, you will need to count the carbohydrates in each food that is included. EXAMPLE OF CARBOHYDRATE COUNTING Sample Dinner  3 oz chicken breast.   cup of brown rice.   cup of corn.  1 cup milk.   1 cup strawberries with sugar-free whipped topping.  Carbohydrate Calculation Step 1: Identify the foods that contain carbohydrates:   Rice.   Corn.   Milk.   Strawberries.  Step 2:Calculate the number of servings eaten of each:   2 servings of rice.   1 serving of corn.   1 serving of milk.   1 serving of strawberries. Step 3: Multiply each of those number of servings by 15 g:   2 servings of rice x 15 g = 30 g.   1 serving of corn x 15 g = 15 g.   1 serving of milk x 15 g = 15 g.   1 serving of strawberries x 15 g = 15 g. Step 4: Add together all of the amounts to find the total grams of carbohydrates eaten: 30 g + 15 g + 15 g + 15 g = 75 g.   This information is not intended to replace advice given to you by your health care provider. Make sure you discuss any questions you have with your health care provider.   Document Released: 08/29/2005 Document Revised: 09/19/2014 Document Reviewed: 07/26/2013 Elsevier Interactive Patient Education [employer].

## 2015-11-25 NOTE — Assessment & Plan Note (Signed)
A1C is showing small improvements. Continue to work on diet and exercise and continue Victoza and Lantus and novolog.

## 2015-11-25 NOTE — Assessment & Plan Note (Signed)
Stable for now--continue current ASA, Lipitor, coreg, NTG

## 2015-12-08 ENCOUNTER — Other Ambulatory Visit: Payer: Self-pay | Admitting: Family Medicine

## 2015-12-28 ENCOUNTER — Encounter: Payer: Self-pay | Admitting: Cardiology

## 2016-01-01 NOTE — Telephone Encounter (Signed)
Cologuard order has been cancelled. Ordered on 10-09-2015. Pt never returned a sample to process.

## 2016-01-03 NOTE — Telephone Encounter (Signed)
Thanks for letting me know. Can you please notify this patient and see if the kit was delivered, and if he understood the direction to send it back. We can order this again for him if he wishes to proceed and return it. We discussed options for screening at his clinic appointment and he preferred at that time to proceed with Cologuard and hold off on colonoscopy if possible. Please let me know if this has changed. Thanks

## 2016-01-04 ENCOUNTER — Encounter: Payer: Self-pay | Admitting: *Deleted

## 2016-01-04 NOTE — Telephone Encounter (Signed)
Unable to reach patient by phone. Mailed a letter asking him what he would like to do for screening.

## 2016-01-19 ENCOUNTER — Telehealth: Payer: Self-pay | Admitting: *Deleted

## 2016-01-19 NOTE — Telephone Encounter (Signed)
-----   Message from Hulan Saas, RN sent at 01/04/2016 10:01 AM EDT ----- Did patient call to discuss Cologuard VS colonoscopy with SA.

## 2016-01-19 NOTE — Telephone Encounter (Signed)
Unable to reach patient by phone on 11/25/15 so I mailed patient a letter on 01/04/16 requesting he call us to discuss cologuard testing that he had not completed and possible other options. Patient has not called back to discuss.

## 2016-02-09 ENCOUNTER — Other Ambulatory Visit: Payer: Self-pay | Admitting: *Deleted

## 2016-02-09 MED ORDER — "INSULIN SYRINGE-NEEDLE U-100 31G X 5/16"" 1 ML MISC": 1.0000 [IU] | Status: DC

## 2016-02-15 MED ORDER — "INSULIN SYRINGE-NEEDLE U-100 31G X 5/16"" 1 ML MISC": 1.0000 [IU] | Status: DC

## 2016-02-15 NOTE — Telephone Encounter (Signed)
Received another refill request for insulin syringes.  Refill was approved on 02/09/16, however stated fax. Refill resent electronically.  Derl Barrow, RN

## 2016-02-15 NOTE — Addendum Note (Signed)
Addended by: Derl Barrow on: 02/15/2016 01:36 PM   Modules accepted: Orders

## 2016-02-17 ENCOUNTER — Ambulatory Visit (INDEPENDENT_AMBULATORY_CARE_PROVIDER_SITE_OTHER): Payer: Medicaid Other | Admitting: Family Medicine

## 2016-02-17 ENCOUNTER — Encounter: Payer: Self-pay | Admitting: Family Medicine

## 2016-02-17 DIAGNOSIS — B353 Tinea pedis: Secondary | ICD-10-CM | POA: Diagnosis not present

## 2016-02-17 DIAGNOSIS — E1159 Type 2 diabetes mellitus with other circulatory complications: Secondary | ICD-10-CM | POA: Diagnosis not present

## 2016-02-17 DIAGNOSIS — E785 Hyperlipidemia, unspecified: Secondary | ICD-10-CM

## 2016-02-17 DIAGNOSIS — I10 Essential (primary) hypertension: Secondary | ICD-10-CM

## 2016-02-17 DIAGNOSIS — F329 Major depressive disorder, single episode, unspecified: Secondary | ICD-10-CM | POA: Diagnosis not present

## 2016-02-17 DIAGNOSIS — M25512 Pain in left shoulder: Secondary | ICD-10-CM

## 2016-02-17 DIAGNOSIS — F339 Major depressive disorder, recurrent, unspecified: Secondary | ICD-10-CM | POA: Insufficient documentation

## 2016-02-17 DIAGNOSIS — B369 Superficial mycosis, unspecified: Secondary | ICD-10-CM

## 2016-02-17 DIAGNOSIS — H6240 Otitis externa in other diseases classified elsewhere, unspecified ear: Secondary | ICD-10-CM

## 2016-02-17 DIAGNOSIS — F32A Depression, unspecified: Secondary | ICD-10-CM

## 2016-02-17 LAB — POCT GLYCOSYLATED HEMOGLOBIN (HGB A1C): Hemoglobin A1C: 9.5

## 2016-02-17 MED ORDER — INSULIN GLARGINE 100 UNIT/ML ~~LOC~~ SOLN: 40.0000 [IU] | Freq: Two times a day (BID) | SUBCUTANEOUS | Status: DC

## 2016-02-17 MED ORDER — INSULIN ASPART 100 UNIT/ML ~~LOC~~ SOLN: SUBCUTANEOUS | Status: DC

## 2016-02-17 MED ORDER — CITALOPRAM HYDROBROMIDE 20 MG PO TABS: 20.0000 mg | ORAL_TABLET | Freq: Every day | ORAL | Status: DC

## 2016-02-17 MED ORDER — NYSTATIN 100000 UNIT/GM EX CREA: 1.0000 "application " | TOPICAL_CREAM | Freq: Two times a day (BID) | CUTANEOUS | Status: DC

## 2016-02-17 NOTE — Patient Instructions (Signed)
Diabetes and Foot Care Diabetes may cause you to have problems because of poor blood supply (circulation) to your feet and legs. This may cause the skin on your feet to become thinner, break easier, and heal more slowly. Your skin may become dry, and the skin may peel and crack. You may also have nerve damage in your legs and feet causing decreased feeling in them. You may not notice minor injuries to your feet that could lead to infections or more serious problems. Taking care of your feet is one of the most important things you can do for yourself.  HOME CARE INSTRUCTIONS  Wear shoes at all times, even in the house. Do not go barefoot. Bare feet are easily injured.  Check your feet daily for blisters, cuts, and redness. If you cannot see the bottom of your feet, use a mirror or ask someone for help.  Wash your feet with warm water (do not use hot water) and mild soap. Then pat your feet and the areas between your toes until they are completely dry. Do not soak your feet as this can dry your skin.  Apply a moisturizing lotion or petroleum jelly (that does not contain alcohol and is unscented) to the skin on your feet and to dry, brittle toenails. Do not apply lotion between your toes.  Trim your toenails straight across. Do not dig under them or around the cuticle. File the edges of your nails with an emery board or nail file.  Do not cut corns or calluses or try to remove them with medicine.  Wear clean socks or stockings every day. Make sure they are not too tight. Do not wear knee-high stockings since they may decrease blood flow to your legs.  Wear shoes that fit properly and have enough cushioning. To break in new shoes, wear them for just a few hours a day. This prevents you from injuring your feet. Always look in your shoes before you put them on to be sure there are no objects inside.  Do not cross your legs. This may decrease the blood flow to your feet.  If you find a minor scrape,  cut, or break in the skin on your feet, keep it and the skin around it clean and dry. These areas may be cleansed with mild soap and water. Do not cleanse the area with peroxide, alcohol, or iodine.  When you remove an adhesive bandage, be sure not to damage the skin around it.  If you have a wound, look at it several times a day to make sure it is healing.  Do not use heating pads or hot water bottles. They may burn your skin. If you have lost feeling in your feet or legs, you may not know it is happening until it is too late.  Make sure your health care provider performs a complete foot exam at least annually or more often if you have foot problems. Report any cuts, sores, or bruises to your health care provider immediately. SEEK MEDICAL CARE IF:   You have an injury that is not healing.  You have cuts or breaks in the skin.  You have an ingrown nail.  You notice redness on your legs or feet.  You feel burning or tingling in your legs or feet.  You have pain or cramps in your legs and feet.  Your legs or feet are numb.  Your feet always feel cold. SEEK IMMEDIATE MEDICAL CARE IF:   There is increasing redness,   swelling, or pain in or around a wound.  There is a red line that goes up your leg.  Pus is coming from a wound.  You develop a fever or as directed by your health care provider.  You notice a bad smell coming from an ulcer or wound.   This information is not intended to replace advice given to you by your health care provider. Make sure you discuss any questions you have with your health care provider.   Document Released: 08/26/2000 Document Revised: 05/01/2013 Document Reviewed: 02/05/2013 Elsevier Interactive Patient Education 2016 [place] Athlete's foot (tinea pedis) is a fungal infection of the skin on the feet. It often occurs on the skin between the toes or underneath the toes. It can also occur on the soles of the feet. Athlete's foot  is more likely to occur in hot, humid weather. Not washing your feet or changing your socks often enough can contribute to athlete's foot. The infection can spread from person to person (contagious). CAUSES Athlete's foot is caused by a fungus. This fungus thrives in warm, moist places. Most people get athlete's foot by sharing shower stalls, towels, and wet floors with an infected person. People with weakened immune systems, including those with diabetes, may be more likely to get athlete's foot. SYMPTOMS   Itchy areas between the toes or on the soles of the feet.  White, flaky, or scaly areas between the toes or on the soles of the feet.  Tiny, intensely itchy blisters between the toes or on the soles of the feet.  Tiny cuts on the skin. These cuts can develop a bacterial infection.  Thick or discolored toenails. DIAGNOSIS  Your caregiver can usually tell what the problem is by doing a physical exam. Your caregiver may also take a skin sample from the rash area. The skin sample may be examined under a microscope, or it may be tested to see if fungus will grow in the sample. A sample may also be taken from your toenail for testing. TREATMENT  Over-the-counter and prescription medicines can be used to kill the fungus. These medicines are available as powders or creams. Your caregiver can suggest medicines for you. Fungal infections respond slowly to treatment. You may need to continue using your medicine for several weeks. PREVENTION   Do not share towels.  Wear sandals in wet areas, such as shared locker rooms and shared showers.  Keep your feet dry. Wear shoes that allow air to circulate. Wear cotton or wool socks. HOME CARE INSTRUCTIONS   Take medicines as directed by your caregiver. Do not use steroid creams on athlete's foot.  Keep your feet clean and cool. Wash your feet daily and dry them thoroughly, especially between your toes.  Change your socks every day. Wear cotton or wool  socks. In hot climates, you may need to change your socks 2 to 3 times per day.  Wear sandals or canvas tennis shoes with good air circulation.  If you have blisters, soak your feet in Burow's solution or Epsom salts for 20 to 30 minutes, 2 times a day to dry out the blisters. Make sure you dry your feet thoroughly afterward. SEEK MEDICAL CARE IF:   You have a fever.  You have swelling, soreness, warmth, or redness in your foot.  You are not getting better after 7 days of treatment.  You are not completely cured after 30 days.  You have any problems caused by your medicines. MAKE SURE YOU:  Understand these instructions.  Will watch your condition.  Will get help right away if you are not doing well or get worse.   This information is not intended to replace advice given to you by your health care provider. Make sure you discuss any questions you have with your health care provider.   Document Released: 08/26/2000 Document Revised: 11/21/2011 Document Reviewed: 03/02/2015 Elsevier Interactive Patient Education 2016 [employer]. Smoking Cessation, Tips for Success If you are ready to quit smoking, congratulations! You have chosen to help yourself be healthier. Cigarettes bring nicotine, tar, carbon monoxide, and other irritants into your body. Your lungs, heart, and blood vessels will be able to work better without these poisons. There are many different ways to quit smoking. Nicotine gum, nicotine patches, a nicotine inhaler, or nicotine nasal spray can help with physical craving. Hypnosis, support groups, and medicines help break the habit of smoking. WHAT THINGS CAN I DO TO MAKE QUITTING EASIER?  Here are some tips to help you quit for good:  Pick a date when you will quit smoking completely. Tell all of your friends and family about your plan to quit on that date.  Do not try to slowly cut down on the number of cigarettes you are smoking. Pick a quit date and quit smoking  completely starting on that day.  Throw away all cigarettes.   Clean and remove all ashtrays from your home, work, and car.  On a card, write down your reasons for quitting. Carry the card with you and read it when you get the urge to smoke.  Cleanse your body of nicotine. Drink enough water and fluids to keep your urine clear or pale yellow. Do this after quitting to flush the nicotine from your body.  Learn to predict your moods. Do not let a bad situation be your excuse to have a cigarette. Some situations in your life might tempt you into wanting a cigarette.  Never have "just one" cigarette. It leads to wanting another and another. Remind yourself of your decision to quit.  Change habits associated with smoking. If you smoked while driving or when feeling stressed, try other activities to replace smoking. Stand up when drinking your coffee. Brush your teeth after eating. Sit in a different chair when you read the paper. Avoid alcohol while trying to quit, and try to drink fewer caffeinated beverages. Alcohol and caffeine may urge you to smoke.  Avoid foods and drinks that can trigger a desire to smoke, such as sugary or spicy foods and alcohol.  Ask people who smoke not to smoke around you.  Have something planned to do right after eating or having a cup of coffee. For example, plan to take a walk or exercise.  Try a relaxation exercise to calm you down and decrease your stress. Remember, you may be tense and nervous for the first 2 weeks after you quit, but this will pass.  Find new activities to keep your hands busy. Play with a pen, coin, or rubber band. Doodle or draw things on paper.  Brush your teeth right after eating. This will help cut down on the craving for the taste of tobacco after meals. You can also try mouthwash.   Use oral substitutes in place of cigarettes. Try using lemon drops, carrots, cinnamon sticks, or chewing gum. Keep them handy so they are available when  you have the urge to smoke.  When you have the urge to smoke, try deep breathing.  Designate your  home as a nonsmoking area.  If you are a heavy smoker, ask your health care provider about a prescription for nicotine chewing gum. It can ease your withdrawal from nicotine.  Reward yourself. Set aside the cigarette money you save and buy yourself something nice.  Look for support from others. Join a support group or smoking cessation program. Ask someone at home or at work to help you with your plan to quit smoking.  Always ask yourself, "Do I need this cigarette or is this just a reflex?" Tell yourself, "Today, I choose not to smoke," or "I do not want to smoke." You are reminding yourself of your decision to quit.  Do not replace cigarette smoking with electronic cigarettes (commonly called e-cigarettes). The safety of e-cigarettes is unknown, and some may contain harmful chemicals.  If you relapse, do not give up! Plan ahead and think about what you will do the next time you get the urge to smoke. HOW WILL I FEEL WHEN I QUIT SMOKING? You may have symptoms of withdrawal because your body is used to nicotine (the addictive substance in cigarettes). You may crave cigarettes, be irritable, feel very hungry, cough often, get headaches, or have difficulty concentrating. The withdrawal symptoms are only temporary. They are strongest when you first quit but will go away within 10-14 days. When withdrawal symptoms occur, stay in control. Think about your reasons for quitting. Remind yourself that these are signs that your body is healing and getting used to being without cigarettes. Remember that withdrawal symptoms are easier to treat than the major diseases that smoking can cause.  Even after the withdrawal is over, expect periodic urges to smoke. However, these cravings are generally short lived and will go away whether you smoke or not. Do not smoke! WHAT RESOURCES ARE AVAILABLE TO HELP ME QUIT  SMOKING? Your health care provider can direct you to community resources or hospitals for support, which may include:  Group support.  Education.  Hypnosis.  Therapy.   This information is not intended to replace advice given to you by your health care provider. Make sure you discuss any questions you have with your health care provider.   Document Released: 05/27/2004 Document Revised: 09/19/2014 Document Reviewed: 02/14/2013 Elsevier Interactive Patient Education [employer].

## 2016-02-17 NOTE — Progress Notes (Signed)
    Subjective:    Patient ID: Keoni Churchwell is a 55 y.o. male presenting with Diabetes; ear bleeding; Shoulder Pain; and scaly feet  on 02/17/2016  HPI: Reports worsening foot fungus. Tried mycotin powder and it is not helping. His BS are too high. 176 this morning. Notes some dietary indiscretion. Working on quitting smoking--using nicorette and no cigarettes x 3 days. Got tubes placed in ears and now has itching in one ear. Left shoulder pain, worse with lifting above his head x 1 month. Review of Systems  Constitutional: Negative for fever and chills.  Respiratory: Negative for shortness of breath.   Cardiovascular: Negative for leg swelling.  Gastrointestinal: Negative for nausea, vomiting and abdominal pain.      Objective:    BP 128/60 mmHg  Pulse 72  Temp(Src) 97.8 F (36.6 C) (Oral)  Ht 5\' 6"  (1.676 m)  Wt 267 lb (121.11 kg)  BMI 43.12 kg/m2 Physical Exam  Constitutional: He appears well-developed and well-nourished. No distress.  HENT:  Head: Normocephalic and atraumatic.  Right Ear: A foreign body is present.  Left Ear: A foreign body is present.  TM's are abnormal bilaterally. The left one has significant graying and abnormal architecture around the indwelling tubes  Eyes: No scleral icterus.  Neck: Neck supple.  Cardiovascular: Normal rate.   Pulmonary/Chest: Effort normal.  Abdominal: Soft.  Musculoskeletal: He exhibits no edema.  Neurological: He is alert.  Skin: Skin is warm.  Psychiatric: He has a normal mood and affect.  Vitals reviewed.       Assessment & Plan:   Problem List Items Addressed This Visit      Medium   Essential hypertension (Chronic)    BP at goal--continue coreg, zestril      Hyperlipidemia with target LDL less than 70 (Chronic)    Continue Lipitor        Unprioritized   Depression, acute    Followed by [facility]--on Celexa      Relevant Medications   citalopram (CELEXA) 20 MG tablet    Other Visit Diagnoses    Type  2 diabetes mellitus with other circulatory complications (HCC)        Increase both Lantus and Novolog    Relevant Medications    insulin glargine (LANTUS) 100 UNIT/ML injection    insulin aspart (NOVOLOG) 100 UNIT/ML injection    Other Relevant Orders    POCT A1C (Completed)    Microalbumin/Creatinine Ratio, Urine    Ambulatory referral to Ophthalmology    Tinea pedis of both feet        Relevant Medications    nystatin cream (MYCOSTATIN)    Fungal ear infection        Relevant Medications    nystatin cream (MYCOSTATIN)    Other Relevant Orders    Ambulatory referral to ENT    Pain in joint of left shoulder        Trial of NSAIDS        Total face-to-face time with patient: 25 minutes. Over 50% of encounter was spent on counseling and coordination of care. Return in about 3 months (around 05/19/2016) for a follow-up.  Brazen Domangue S 02/17/2016 2:12 PM

## 2016-02-18 NOTE — Assessment & Plan Note (Signed)
Followed by Ward Chatters Celexa

## 2016-02-18 NOTE — Assessment & Plan Note (Signed)
Increase both Lantus and Novolog

## 2016-02-18 NOTE — Assessment & Plan Note (Signed)
-  Continue Lipitor °

## 2016-02-18 NOTE — Assessment & Plan Note (Signed)
BP at goal--continue coreg, zestril

## 2016-03-04 ENCOUNTER — Telehealth: Payer: Self-pay | Admitting: Cardiology

## 2016-03-04 NOTE — Telephone Encounter (Signed)
Patient calling the office for samples of medication:   1.  What medication and dosage are you requesting samples for? effient 10 mg   2.  Are you currently out of this medication? 6 tabs left

## 2016-03-04 NOTE — Telephone Encounter (Signed)
Returned call to patient no answer.Left message on personal voice mail office out of Effient.

## 2016-03-09 LAB — HM DIABETES EYE EXAM

## 2016-03-10 ENCOUNTER — Other Ambulatory Visit: Payer: Self-pay | Admitting: *Deleted

## 2016-03-10 MED ORDER — TRAMADOL HCL 50 MG PO TABS: 50.0000 mg | ORAL_TABLET | Freq: Four times a day (QID) | ORAL | Status: DC | PRN

## 2016-03-10 NOTE — Telephone Encounter (Signed)
LM for pharmacy line with script information. Hilton Saephan,CMA

## 2016-03-16 ENCOUNTER — Telehealth: Payer: Self-pay | Admitting: Cardiology

## 2016-03-16 NOTE — Telephone Encounter (Signed)
New message    Patient calling wants to know can sample be ready on Monday 7.10.2017 when he arrived to see [name] .

## 2016-03-16 NOTE — Telephone Encounter (Signed)
Returned call to patient requesting Effient samples.Office presently out of Effient samples.Stated he has appointment with Dr.Jordan 03/21/16 he will ask again then.

## 2016-03-21 ENCOUNTER — Ambulatory Visit (INDEPENDENT_AMBULATORY_CARE_PROVIDER_SITE_OTHER): Payer: Medicaid Other | Admitting: Cardiology

## 2016-03-21 ENCOUNTER — Encounter: Payer: Self-pay | Admitting: Cardiology

## 2016-03-21 VITALS — BP 119/70 | HR 72 | Ht 67.0 in | Wt 272.0 lb

## 2016-03-21 DIAGNOSIS — Z9861 Coronary angioplasty status: Secondary | ICD-10-CM

## 2016-03-21 DIAGNOSIS — E1159 Type 2 diabetes mellitus with other circulatory complications: Secondary | ICD-10-CM

## 2016-03-21 DIAGNOSIS — I10 Essential (primary) hypertension: Secondary | ICD-10-CM | POA: Diagnosis not present

## 2016-03-21 DIAGNOSIS — E785 Hyperlipidemia, unspecified: Secondary | ICD-10-CM | POA: Diagnosis not present

## 2016-03-21 DIAGNOSIS — Z794 Long term (current) use of insulin: Secondary | ICD-10-CM

## 2016-03-21 DIAGNOSIS — Z720 Tobacco use: Secondary | ICD-10-CM

## 2016-03-21 DIAGNOSIS — I2510 Atherosclerotic heart disease of native coronary artery without angina pectoris: Secondary | ICD-10-CM

## 2016-03-21 NOTE — Patient Instructions (Signed)
We will schedule you for fasting lab work.  Congratulations on your efforts at smoking cessation.  I will see you in 6 months.

## 2016-03-21 NOTE — Progress Notes (Signed)
03/21/2016 [name] [name]   09/29/1960  [id number]  Primary Physician [name], MD Primary Cardiologist: [name]  HPI:  [name] [name] is seen today for follow up CAD. His past medical history is significant for CAD, DM, obesity, tobacco use , hyperlipidemia and hypertension. He is status post stenting of the right coronary artery in May of 2013. He was admitted with a NSTEMI inFebruary of 2015. He was found to have severe disease in LCx and OM2 treated with BMS.  He was placed on dual antiplatelet therapy with aspirin plus Effient.   He presented with increased angina in October 2015. A Myoview study was intermediate risk.  He had repeat cardiac cath in November 2015.  He was found to have severe single-vessel disease involving the bifurcation of OM1 and OM 2 with 99% in-stent restenosis in the bare-metal stent placed in the OM 2. He underwent successful PCI spanning the proximal circumflex stent across OM 1 through the bare-metal stent placed in OM 2 with a Xience alpine DES stent. He was also noted to have a widely patent stent in the RCA and proximal circumflex with otherwise mild-moderate disease. He has preserved LVEF with an ejection fraction of 55-65%. He was instructed to continue with DAPT with ASA + Effient.   On follow up today he reports he is doing very well. No chest pain or SOB. He is trying to quit smoking. Stopped 9 days ago and is using a Nicoderm patch. Diabetes control is suboptimal  with last A1c 9.5%.  He states he is not walking as much due to the hot weather.    Current Outpatient Prescriptions  Medication Sig Dispense Refill  . albuterol (PROVENTIL HFA;VENTOLIN HFA) 108 (90 BASE) MCG/ACT inhaler Inhale 1-2 puffs into the lungs every 6 (six) hours as needed for wheezing or shortness of breath. 1 Inhaler 0  . aspirin 81 MG chewable tablet Chew 1 tablet (81 mg total) by mouth daily. 180 tablet 2  . atorvastatin (LIPITOR) 80 MG tablet TAKE 1 TABLET (80 MG TOTAL) BY MOUTH  DAILY. 30 tablet 5  . carvedilol (COREG) 3.125 MG tablet TAKE 1 TABLET (3.125 MG TOTAL) BY MOUTH 2 (TWO) TIMES DAILY WITH A MEAL. 60 tablet 2  . citalopram (CELEXA) 20 MG tablet Take 1 tablet (20 mg total) by mouth daily. 30 tablet 12  . insulin aspart (NOVOLOG) 100 UNIT/ML injection INJECT 30 UNITS INTO THE SKIN TWICE DAILY WITH MEALS 10 mL 5  . insulin glargine (LANTUS) 100 UNIT/ML injection Inject 0.4 mLs (40 Units total) into the skin 2 (two) times daily. 20 mL 3  . Liraglutide 18 MG/3ML SOPN Inject 0.3 mLs (1.8 mg total) into the skin daily. Dispense QS for 1 month supply 3 pen 11  . lisinopril (PRINIVIL,ZESTRIL) 5 MG tablet TAKE 0.5 TABLETS (2.5 MG TOTAL) BY MOUTH 2 (TWO) TIMES DAILY. 60 tablet 11  . metFORMIN (GLUCOPHAGE) 1000 MG tablet Take 1 tablet (1,000 mg total) by mouth 2 (two) times daily with a meal. 60 tablet 11  . mometasone-formoterol (DULERA) 200-5 MCG/ACT AERO Inhale 2 puffs into the lungs 2 (two) times daily. 1 Inhaler 3  . nitroGLYCERIN (NITROSTAT) 0.4 MG SL tablet Place 1 tablet (0.4 mg total) under the tongue every 5 (five) minutes x 3 doses as needed for chest pain. 100 tablet 3  . Omega-3 Fatty Acids (FISH OIL PO) Take 1 capsule by mouth 2 (two) times daily.     . prasugrel (EFFIENT) 10 MG TABS tablet Take 1  tablet (10 mg total) by mouth daily. 30 tablet 6  . traMADol (ULTRAM) 50 MG tablet Take 1 tablet (50 mg total) by mouth every 6 (six) hours as needed. 10 tablet 0  . ACCU-CHEK FASTCLIX LANCETS MISC 1 Units by Percutaneous route 4 (four) times daily. 100 each 12  . Blood Glucose Monitoring Suppl (ACCU-CHEK NANO SMARTVIEW) W/DEVICE KIT 1 Units by Does not apply route as directed. Check fasting, and two hours after breakfast, lunch and dinner 1 kit 0  . glucose blood (ACCU-CHEK SMARTVIEW) test strip Check blood sugars 4x/daily 100 each 12  . Insulin Pen Needle (BD ULTRA-FINE PEN NEEDLES) 29G X 12.7MM MISC Use to with Victoza Pen, injection once daily. 100 each 11  .  Insulin Syringe-Needle U-100 (FREESTYLE PRECISION INS SYR) 31G X 5/16" 1 ML MISC 1 Units by Does not apply route as directed. Dx: Diabetes 250.02.  Dispense:  QS for 5 shots daily - 1 month supply 100 each 0   No current facility-administered medications for this visit.    Allergies  Allergen Reactions  . Shellfish Allergy Nausea And Vomiting    Social History   Social History  . Marital Status: Single    Spouse Name: N/A  . Number of Children: 0  . Years of Education: 15   Occupational History  . Unemployed     Used to work for [employer]  . 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 Smoking status: Current Some Day Smoker -- 0.50 packs/day for 37 years    Types: Cigarettes    Last Attempt to Quit: 12/18/2014  . Smokeless tobacco: Never Used  . Alcohol Use: 3.6 oz/week    6 Cans of beer per week     Comment: 08/01/2014 "8-12 beers once/ week"  . Drug Use: Yes     Comment: Crack Cocaine   . Sexual Activity: Not Currently   Other Topics Concern  . Not on file   Social History Narrative   Single.  Lives with a roommate.  Ambulates independently.     Review of Systems: AS noted in HPI All other systems reviewed and are otherwise negative except as noted above.    Blood pressure 119/70, pulse 72, height '5\' 7"'  (1.702 m), weight 272 lb (123.378 kg).  General appearance: alert, obese, cooperative and no distress Neck: no carotid bruit and no JVD Lungs: clear to auscultation bilaterally Heart: regular rate and rhythm, S1, S2 normal, no murmur, click, rub or gallop Extremities: no LEE Pulses: 2+ and symmetric Skin: warm and dry Neurologic: Grossly normal    ASSESSMENT AND PLAN:   1. CAD: Status post repeat PCI to the proximal circumflex and OM 2 in Nov. 2015 as outlined above with DES for instent restenosis. He denies any recurrent anginal symptoms.  Continue dual antiplatelet therapy with aspirin plus Effient indefinitely due to  extensive nature of stents.  Continue beta blocker, statin and ACE inhibitor for secondary prevention.  2. Hypertension: Blood pressure is well-controlled. Continue lisinopril and atenolol.  3. Hyperlipidemia: on statin. Will schedule for fasting lipid panel and chemistries.  4. Diabetes: per primary care. Needs to lose weight. Repeat A1c  5. Tobacco abuse: Smoking cessation was strongly advised. Applauded efforts at stopping.   I will follow up in 6 months.  Peter Martinique MD,FACC  03/21/2016 3:37 PM

## 2016-03-30 LAB — LIPID PANEL
CHOL/HDL RATIO: 3.7 ratio (ref <=5.0)
Cholesterol: 154 mg/dL (ref 125–200)
HDL: 42 mg/dL (ref >=40)
LDL CALC: 59 mg/dL (ref <130)
Triglycerides: 264 mg/dL — ABNORMAL HIGH (ref <150)
VLDL: 53 mg/dL — AB (ref <30)

## 2016-03-30 LAB — BASIC METABOLIC PANEL
BUN: 10 mg/dL (ref 7–25)
CALCIUM: 8.9 mg/dL (ref 8.6–10.3)
CHLORIDE: 102 mmol/L (ref 98–110)
CO2: 27 mmol/L (ref 20–31)
Creat: 0.71 mg/dL (ref 0.70–1.33)
GLUCOSE: 200 mg/dL — AB (ref 65–99)
POTASSIUM: 4.4 mmol/L (ref 3.5–5.3)
SODIUM: 138 mmol/L (ref 135–146)

## 2016-03-30 LAB — HEPATIC FUNCTION PANEL
ALT: 21 U/L (ref 9–46)
AST: 16 U/L (ref 10–35)
Albumin: 3.9 g/dL (ref 3.6–5.1)
Alkaline Phosphatase: 115 U/L (ref 40–115)
BILIRUBIN DIRECT: 0.1 mg/dL (ref <=0.2)
BILIRUBIN TOTAL: 0.6 mg/dL (ref 0.2–1.2)
Indirect Bilirubin: 0.5 mg/dL (ref 0.2–1.2)
Total Protein: 6.9 g/dL (ref 6.1–8.1)

## 2016-03-30 LAB — HEMOGLOBIN A1C
HEMOGLOBIN A1C: 9 % — AB (ref <5.7)
MEAN PLASMA GLUCOSE: 212 mg/dL

## 2016-04-01 ENCOUNTER — Telehealth: Payer: Self-pay | Admitting: Cardiology

## 2016-04-01 NOTE — Telephone Encounter (Signed)
We do not have any samples of Effient at the [facility].

## 2016-04-01 NOTE — Telephone Encounter (Signed)
LMTCB for pt to let him know we do have samples of Effient.

## 2016-04-01 NOTE — Telephone Encounter (Signed)
Pt aware no samples available . 

## 2016-04-01 NOTE — Telephone Encounter (Signed)
New message      Patient calling the office for samples of medication:   1.  What medication and dosage are you requesting samples for?Effient 10 mg po daily  2.  Are you currently out of this medication? Yes  Just got off the phone with Hilda Blades at [facility] they are out of samples for this medication for the pt so the pt was wondering if we have the medication, here at this clinic.

## 2016-04-01 NOTE — Telephone Encounter (Signed)
Pt advised with do not have any Effient samples at [facility].

## 2016-04-01 NOTE — Telephone Encounter (Signed)
New message     The pt is calling to see if the samples of Effient he asked for the other day when he had his appiontment there are no more mins left on his phone, so he is using another phone

## 2016-04-06 ENCOUNTER — Telehealth: Payer: Self-pay | Admitting: Cardiology

## 2016-04-06 NOTE — Telephone Encounter (Signed)
Attempted to call patient - no samples available - VM box full

## 2016-04-06 NOTE — Telephone Encounter (Signed)
New message       Patient calling the office for samples of medication:   1.  What medication and dosage are you requesting samples for? effient 10 mg po  2.  Are you currently out of this medication? No    The pt was instructed to call today and see if the office has any samples

## 2016-04-07 NOTE — Telephone Encounter (Signed)
Unable to reach pt or leave a message  

## 2016-04-11 ENCOUNTER — Telehealth: Payer: Self-pay | Admitting: Cardiology

## 2016-04-11 NOTE — Telephone Encounter (Signed)
New message       The pt is calling to see if we have samples    Patient calling the office for samples of medication:   1.  What medication and dosage are you requesting samples for?Effient 10 mg po  2.  Are you currently out of this medication? yes

## 2016-04-11 NOTE — Telephone Encounter (Signed)
Left message no samples available at this time. He is to call if prescription is needed.

## 2016-04-11 NOTE — Telephone Encounter (Signed)
Returned call to patient.Left message on personal voice mail office out of effient samples.

## 2016-04-25 ENCOUNTER — Telehealth: Payer: Self-pay | Admitting: Cardiology

## 2016-04-25 NOTE — Telephone Encounter (Signed)
New message       Calling to see if his effient medications has come?

## 2016-04-25 NOTE — Telephone Encounter (Signed)
Left message for pt, no samples available. Printed the pt assistance forms for lilly if pt is interested he was instructed to call back.

## 2016-05-09 ENCOUNTER — Encounter (HOSPITAL_COMMUNITY): Payer: Self-pay

## 2016-05-09 ENCOUNTER — Emergency Department (HOSPITAL_COMMUNITY)
Admission: EM | Admit: 2016-05-09 | Discharge: 2016-05-09 | Disposition: A | Discharge status: Home / Self Care | Payer: Medicaid Other | Attending: Emergency Medicine | Admitting: Emergency Medicine

## 2016-05-09 DIAGNOSIS — H6091 Unspecified otitis externa, right ear: Secondary | ICD-10-CM

## 2016-05-09 DIAGNOSIS — Z79899 Other long term (current) drug therapy: Secondary | ICD-10-CM | POA: Diagnosis not present

## 2016-05-09 DIAGNOSIS — I2510 Atherosclerotic heart disease of native coronary artery without angina pectoris: Secondary | ICD-10-CM | POA: Diagnosis not present

## 2016-05-09 DIAGNOSIS — Z7984 Long term (current) use of oral hypoglycemic drugs: Secondary | ICD-10-CM | POA: Insufficient documentation

## 2016-05-09 DIAGNOSIS — E119 Type 2 diabetes mellitus without complications: Secondary | ICD-10-CM | POA: Insufficient documentation

## 2016-05-09 DIAGNOSIS — Z955 Presence of coronary angioplasty implant and graft: Secondary | ICD-10-CM | POA: Insufficient documentation

## 2016-05-09 DIAGNOSIS — Z794 Long term (current) use of insulin: Secondary | ICD-10-CM | POA: Insufficient documentation

## 2016-05-09 DIAGNOSIS — J45909 Unspecified asthma, uncomplicated: Secondary | ICD-10-CM | POA: Insufficient documentation

## 2016-05-09 DIAGNOSIS — J449 Chronic obstructive pulmonary disease, unspecified: Secondary | ICD-10-CM | POA: Diagnosis not present

## 2016-05-09 DIAGNOSIS — I10 Essential (primary) hypertension: Secondary | ICD-10-CM | POA: Diagnosis not present

## 2016-05-09 DIAGNOSIS — Z7982 Long term (current) use of aspirin: Secondary | ICD-10-CM | POA: Diagnosis not present

## 2016-05-09 DIAGNOSIS — Z87891 Personal history of nicotine dependence: Secondary | ICD-10-CM | POA: Insufficient documentation

## 2016-05-09 DIAGNOSIS — H9201 Otalgia, right ear: Secondary | ICD-10-CM | POA: Diagnosis present

## 2016-05-09 MED ORDER — ACETAMINOPHEN 325 MG PO TABS
650.0000 mg | ORAL_TABLET | Freq: Once | ORAL | Status: AC
  Administered 2016-05-09: 650 mg via ORAL
  Filled 2016-05-09: qty 2

## 2016-05-09 MED ORDER — CIPROFLOXACIN-DEXAMETHASONE 0.3-0.1 % OT SUSP
4.0000 [drp] | Freq: Two times a day (BID) | OTIC | Status: DC
  Administered 2016-05-09: 4 [drp] via OTIC
  Filled 2016-05-09: qty 7.5

## 2016-05-09 NOTE — ED Provider Notes (Signed)
[facility] DEPT Provider Note   CSN: [id number] Arrival date & time: 05/09/16  0805     History   Chief Complaint Chief Complaint  Patient presents with  . Otalgia    HPI Raylen Tangonan is a 55 y.o. male.  HPI Patient presents with concern of right ear pain, drainage. Patient notes that over the past 2 days has had pain persistently, sore, severe, with no relief from anything, including positioning. He complains of subjective fever, chills, but no nausea, vomiting, confusion, this rotation, headache, vision changes. Patient notes that he uses Q-tips daily, has history of bilateral tympanostomy tubes. He states that he has been advised to stop using Q-tips, but uses them vigorously each day.   Past Medical History:  Diagnosis Date  . Alcoholism ([place])   . Anxiety   . Arthritis    "qwhere"  . Asthma   . CAD S/P percutaneous coronary angioplasty 01/18/12; 10/2013   a. pRCA 3.5 x 18 vision BMS - 4.2 mm; b. 2/'15: mCx 3.5 x 12  Rebel BMS (3.6-3.7 mm)  . Chronic back pain    "mid/lower" (08/01/2014)  . COPD (chronic obstructive pulmonary disease) ([place])    "I'm seeing COPD dr now; don't know if I've got it" (08/01/2014)  . Depression   . GERD (gastroesophageal reflux disease)   . Hyperlipidemia   . Hypertension   . Migraines    "once in awhile" (08/01/2014)  . Non-ST elevation myocardial infarction (NSTEMI) ([place]) 10/2013  . OSA on CPAP 01/19/2012  . Type II diabetes mellitus [facility])     Patient Active Problem List   Diagnosis Date Noted  . Depression, acute 02/17/2016  . Hearing difficulty 12/10/2014  . COPD, moderate ([place]) 11/22/2014  . Cough   . Angina, class II ([place]) 08/01/2014  . H/O NSTEMI (non-ST elevated myocardial infarction) 11/07/2013  . SOB (shortness of breath) on exertion 10/04/2013  . Pre-ulcerative corn or callous 10/12/2012  . Dental caries 10/12/2012  . Back pain 08/24/2012  . Tobacco use 08/24/2012  . Obesity 07/31/2012  . DM type 2 (diabetes  mellitus, type 2) ([place]) 06/05/2012  . CAD S/P percutaneous coronary angioplasty 01/21/2012  . OSA on CPAP 01/19/2012  . Essential hypertension 01/18/2012  . Hyperlipidemia with target LDL less than 70 01/18/2012    Past Surgical History:  Procedure Laterality Date  . CARDIAC CATHETERIZATION  07/2014   Left Main: Short, large-caliber vessel. Widely patent. Bifurcates into the LAD and Circumflex. Angiographically normal.  . CORONARY ANGIOPLASTY WITH STENT PLACEMENT  01/2012; 11/07/2013; 08/01/2014   "1 + 2 + 1"   . CORONARY ANGIOPLASTY WITH STENT PLACEMENT  07/2014   Severe single-vessel disease involving the bifurcation of OM1 and OM 2 with 99% in-stent restenosis in the bare-metal stent placed in the OM 2.  . LEFT HEART CATHETERIZATION WITH CORONARY ANGIOGRAM N/A 01/20/2012   Procedure: LEFT HEART CATHETERIZATION WITH CORONARY ANGIOGRAM;  Surgeon: Jettie Booze, MD;  Location: [facility] CATH LAB;  Service: Cardiovascular;  Laterality: N/A;  . LEFT HEART CATHETERIZATION WITH CORONARY ANGIOGRAM N/A 11/07/2013   Procedure: LEFT HEART CATHETERIZATION WITH CORONARY ANGIOGRAM;  Surgeon: Jettie Booze, MD;  Location: [facility] CATH LAB;  Service: Cardiovascular;  Laterality: N/A;  . LEFT HEART CATHETERIZATION WITH CORONARY ANGIOGRAM N/A 08/01/2014   Procedure: LEFT HEART CATHETERIZATION WITH CORONARY ANGIOGRAM;  Surgeon: Leonie Man, MD;  Location: [facility] CATH LAB;  Service: Cardiovascular;  Laterality: N/A;  . PERCUTANEOUS CORONARY STENT INTERVENTION (PCI-S)  01/20/2012   Procedure: PERCUTANEOUS CORONARY  STENT INTERVENTION (PCI-S);  Surgeon: Jettie Booze, MD;  Location: [facility] CATH LAB;  Service: Cardiovascular;;  . PERCUTANEOUS CORONARY STENT INTERVENTION (PCI-S)  11/07/2013   Procedure: PERCUTANEOUS CORONARY STENT INTERVENTION (PCI-S);  Surgeon: Jettie Booze, MD;  Location: [facility] CATH LAB;  Service: Cardiovascular;;       Home Medications    Prior to Admission medications   Medication Sig  Start Date End Date Taking? Authorizing Provider  ACCU-CHEK FASTCLIX LANCETS MISC 1 Units by Percutaneous route 4 (four) times daily. 07/01/15   [name], MD  albuterol (PROVENTIL HFA;VENTOLIN HFA) 108 (90 BASE) MCG/ACT inhaler Inhale 1-2 puffs into the lungs every 6 (six) hours as needed for wheezing or shortness of breath. 11/22/14   Fransico Meadow, PA-C  aspirin 81 MG chewable tablet Chew 1 tablet (81 mg total) by mouth daily. 10/04/13   [name], MD  atorvastatin (LIPITOR) 80 MG tablet TAKE 1 TABLET (80 MG TOTAL) BY MOUTH DAILY. 12/08/15   [name], MD  Blood Glucose Monitoring Suppl (ACCU-CHEK NANO SMARTVIEW) W/DEVICE KIT 1 Units by Does not apply route as directed. Check fasting, and two hours after breakfast, lunch and dinner 07/01/15   [name], MD  carvedilol (COREG) 3.125 MG tablet TAKE 1 TABLET (3.125 MG TOTAL) BY MOUTH 2 (TWO) TIMES DAILY WITH A MEAL. 10/27/15   [name], MD  citalopram (CELEXA) 20 MG tablet Take 1 tablet (20 mg total) by mouth daily. 02/17/16   [name], MD  glucose blood (ACCU-CHEK SMARTVIEW) test strip Check blood sugars 4x/daily 07/01/15   [name], MD  insulin aspart (NOVOLOG) 100 UNIT/ML injection INJECT 30 UNITS INTO THE SKIN TWICE DAILY WITH MEALS 02/17/16   [name], MD  insulin glargine (LANTUS) 100 UNIT/ML injection Inject 0.4 mLs (40 Units total) into the skin 2 (two) times daily. 02/17/16   [name], MD  Insulin Pen Needle (BD ULTRA-FINE PEN NEEDLES) 29G X 12.7MM MISC Use to with Victoza Pen, injection once daily. 07/22/15   [name], MD  Insulin Syringe-Needle U-100 (FREESTYLE PRECISION INS SYR) 31G X 5/16" 1 ML MISC 1 Units by Does not apply route as directed. Dx: Diabetes 250.02.  Dispense:  QS for 5 shots daily - 1 month supply 02/15/16   [name], MD  Liraglutide 18 MG/3ML SOPN Inject 0.3 mLs (1.8 mg total) into the skin daily. Dispense QS for 1 month supply 11/25/15   [name], MD  lisinopril  (PRINIVIL,ZESTRIL) 5 MG tablet TAKE 0.5 TABLETS (2.5 MG TOTAL) BY MOUTH 2 (TWO) TIMES DAILY. 05/27/15   [name], MD  metFORMIN (GLUCOPHAGE) 1000 MG tablet Take 1 tablet (1,000 mg total) by mouth 2 (two) times daily with a meal. 05/27/15   [name], MD  mometasone-formoterol (DULERA) 200-5 MCG/ACT AERO Inhale 2 puffs into the lungs 2 (two) times daily. 10/02/15   Peter M Martinique, MD  nitroGLYCERIN (NITROSTAT) 0.4 MG SL tablet Place 1 tablet (0.4 mg total) under the tongue every 5 (five) minutes x 3 doses as needed for chest pain. 08/20/15   [name], MD  Omega-3 Fatty Acids (FISH OIL PO) Take 1 capsule by mouth 2 (two) times daily.     Historical Provider, MD  prasugrel (EFFIENT) 10 MG TABS tablet Take 1 tablet (10 mg total) by mouth daily. 09/24/15   Peter M Martinique, MD  traMADol (ULTRAM) 50 MG tablet Take 1 tablet (50 mg total)  by mouth every 6 (six) hours as needed. 03/10/16   [name], MD    Family History Family History  Problem Relation Age of Onset  . Heart attack Father 53  . Breast cancer Mother   . Hypertension Sister   . Hyperlipidemia Sister   . Hyperlipidemia Sister   . Hypertension Sister   . Heart attack Brother   . Stroke Brother   . Diabetes Brother   . Diabetes Sister     Social History Social History  Substance Use Topics  . Smoking status: Former Smoker    Packs/day: 0.50    Years: 37.00    Types: Cigarettes    Quit date: 12/18/2014  . Smokeless tobacco: Never Used     Comment: pt reports "I quit 17 days ago" (05/09/16)  . Alcohol use No     Comment: pt reports "I quit 17 days ago" (05/09/16)     Allergies   Shellfish allergy   Review of Systems Review of Systems  Constitutional: Negative for fever.  HENT: Positive for ear discharge and ear pain. Negative for facial swelling, sinus pressure and sore throat.   Respiratory: Negative for shortness of breath.   Cardiovascular: Negative for chest pain.  Musculoskeletal:       Negative aside  from HPI  Skin:       Negative aside from HPI  Allergic/Immunologic: Negative for immunocompromised state.  Neurological: Negative for weakness.     Physical Exam Updated Vital Signs There were no vitals taken for this visit.  Physical Exam  Constitutional: He is oriented to person, place, and time. He appears well-developed. No distress.  Obese, uncomfortable appearing, but in no distress, awake, alert male  HENT:  Head: Normocephalic and atraumatic.  Ears:  Mouth/Throat: Uvula is midline and oropharynx is clear and moist.  Eyes: Conjunctivae and EOM are normal.  Cardiovascular: Normal rate and regular rhythm.   Pulmonary/Chest: Effort normal and breath sounds normal. No stridor. No respiratory distress.  Musculoskeletal: He exhibits no edema.  Neurological: He is alert and oriented to person, place, and time.  Skin: Skin is warm and dry.  Psychiatric: He has a normal mood and affect.  Nursing note and vitals reviewed.    ED Treatments / Results   Procedures Procedures (including critical care time)  Medications Ordered in ED Medications  acetaminophen (TYLENOL) tablet 650 mg (not administered)  ciprofloxacin-dexamethasone (CIPRODEX) 0.3-0.1 % otic suspension 4 drop (not administered)     Initial Impression / Assessment and Plan / ED Course  I have reviewed the triage vital signs and the nursing notes.  Pertinent labs & imaging results that were available during my care of the patient were reviewed by me and considered in my medical decision making (see chart for details).    Final Clinical Impressions(s) / ED Diagnoses   Final diagnoses:  Otitis externa, right   Patient presents with 2 days of right ear pain. Patient has bilateral tympanostomy tubes. There is some evidence for inflammation, early infection of the right external ear, though no potential erythema, no surrounding erythema suggesting facial cellulitis, no evidence for bacteremia,  sepsis. Patient started on topical antibiotics, anti-inflammatories, Tylenol for pain control. Patient will follow up with his ENT doctor.     Carmin Muskrat, MD 05/09/16 [phone]

## 2016-05-09 NOTE — ED Triage Notes (Signed)
Pt c/o R ear pain x 2 day.  Pain score 10/10.  Pt reports "it feels like there are cobwebs in there." Pt reports "a little" drainage and some hearing loss.

## 2016-05-09 NOTE — Discharge Instructions (Signed)
Please use the provided ear drops twice daily for one week.  You should also use Tylenol for additional pain control.  Please be sure to follow up with Dr. Simeon Craft, if your symptoms have not improved substantially in two days.  If you develop new, or concerning changes in your condition, please be sure to return here or to Dr. Theressa Millard office.

## 2016-05-23 ENCOUNTER — Ambulatory Visit (INDEPENDENT_AMBULATORY_CARE_PROVIDER_SITE_OTHER): Payer: Medicaid Other | Admitting: Family Medicine

## 2016-05-23 ENCOUNTER — Encounter: Payer: Self-pay | Admitting: Family Medicine

## 2016-05-23 VITALS — BP 127/65 | HR 75 | Ht 67.0 in | Wt 271.0 lb

## 2016-05-23 DIAGNOSIS — Z9861 Coronary angioplasty status: Secondary | ICD-10-CM

## 2016-05-23 DIAGNOSIS — G4733 Obstructive sleep apnea (adult) (pediatric): Secondary | ICD-10-CM | POA: Diagnosis not present

## 2016-05-23 DIAGNOSIS — E785 Hyperlipidemia, unspecified: Secondary | ICD-10-CM | POA: Diagnosis not present

## 2016-05-23 DIAGNOSIS — E669 Obesity, unspecified: Secondary | ICD-10-CM

## 2016-05-23 DIAGNOSIS — I10 Essential (primary) hypertension: Secondary | ICD-10-CM | POA: Diagnosis not present

## 2016-05-23 DIAGNOSIS — M545 Low back pain, unspecified: Secondary | ICD-10-CM

## 2016-05-23 DIAGNOSIS — Z720 Tobacco use: Secondary | ICD-10-CM

## 2016-05-23 DIAGNOSIS — R0602 Shortness of breath: Secondary | ICD-10-CM

## 2016-05-23 DIAGNOSIS — I2510 Atherosclerotic heart disease of native coronary artery without angina pectoris: Secondary | ICD-10-CM

## 2016-05-23 DIAGNOSIS — E1159 Type 2 diabetes mellitus with other circulatory complications: Secondary | ICD-10-CM

## 2016-05-23 DIAGNOSIS — Z794 Long term (current) use of insulin: Secondary | ICD-10-CM

## 2016-05-23 DIAGNOSIS — Z23 Encounter for immunization: Secondary | ICD-10-CM

## 2016-05-23 DIAGNOSIS — Z9989 Dependence on other enabling machines and devices: Secondary | ICD-10-CM

## 2016-05-23 DIAGNOSIS — E1310 Other specified diabetes mellitus with ketoacidosis without coma: Secondary | ICD-10-CM | POA: Diagnosis not present

## 2016-05-23 DIAGNOSIS — E1110 Type 2 diabetes mellitus with ketoacidosis without coma: Secondary | ICD-10-CM

## 2016-05-23 MED ORDER — ATORVASTATIN CALCIUM 80 MG PO TABS: 80.0000 mg | ORAL_TABLET | Freq: Every day | ORAL | 5 refills | Status: DC

## 2016-05-23 MED ORDER — LIRAGLUTIDE 18 MG/3ML ~~LOC~~ SOPN: 1.8000 mg | PEN_INJECTOR | Freq: Every day | SUBCUTANEOUS | 11 refills | Status: DC

## 2016-05-23 MED ORDER — TRAMADOL HCL 50 MG PO TABS: 50.0000 mg | ORAL_TABLET | Freq: Four times a day (QID) | ORAL | 0 refills | Status: DC | PRN

## 2016-05-23 MED ORDER — INSULIN ASPART 100 UNIT/ML ~~LOC~~ SOLN: SUBCUTANEOUS | 5 refills | Status: DC

## 2016-05-23 MED ORDER — CARVEDILOL 3.125 MG PO TABS: 3.1250 mg | ORAL_TABLET | Freq: Two times a day (BID) | ORAL | 2 refills | Status: DC

## 2016-05-23 NOTE — Assessment & Plan Note (Signed)
Congratulations on quitting smoking--now work on cutting out candy.

## 2016-05-23 NOTE — Progress Notes (Signed)
Subjective:    Patient ID: [name] [name] is a 55 y.o. male presenting with Medication Refill and Flu Vaccine  on 05/23/2016  HPI: Here for f/u.  Needs refill of meds States HHN has asked for sleep study analysis to be updated yearly. Needs flu shot Quit smoking. Has moved in with his sister.  Review of Systems  Constitutional: Negative for chills and fever.  Respiratory: Negative for shortness of breath.   Cardiovascular: Negative for leg swelling.  Gastrointestinal: Negative for abdominal pain, nausea and vomiting.      Objective:    BP 127/65   Pulse 75   Ht 5\' 7"  (1.702 m)   Wt 271 lb (122.9 kg)   BMI 42.44 kg/m  Physical Exam  Constitutional: He appears well-developed and well-nourished. No distress.  HENT:  Head: Normocephalic and atraumatic.  Eyes: No scleral icterus.  Neck: Neck supple.  Cardiovascular: Normal rate.   Pulmonary/Chest: Effort normal.  Abdominal: Soft.  Musculoskeletal: He exhibits no edema.  Neurological: He is alert.  Skin: Skin is warm.  Psychiatric: He has a normal mood and affect.  Vitals reviewed.       Assessment & Plan:   Problem List Items Addressed This Visit      Medium   Essential hypertension (Chronic)    Continue Lisinopril and Coreg      Relevant Medications   carvedilol (COREG) 3.125 MG tablet   atorvastatin (LIPITOR) 80 MG tablet   Hyperlipidemia with target LDL less than 70 (Chronic)    Continue Lipitor 80 mg      Relevant Medications   carvedilol (COREG) 3.125 MG tablet   atorvastatin (LIPITOR) 80 MG tablet   OSA on CPAP (Chronic)    States he needs to have his CPAP titrated yearly per [facility]      Relevant Orders   Ambulatory referral to Sleep Studies   CAD S/P percutaneous coronary angioplasty - Primary (Chronic)    To f/u per cards, continue Coreg, BP control and atorvastatin      Relevant Medications   carvedilol (COREG) 3.125 MG tablet   atorvastatin (LIPITOR) 80 MG tablet   Tobacco  use (Chronic)    Congratulations on quitting smoking--now work on cutting out candy.      Obesity   Relevant Medications   insulin aspart (NOVOLOG) 100 UNIT/ML injection   Liraglutide 18 MG/3ML SOPN     Unprioritized   Back pain    Refill Ultram      Relevant Medications   traMADol (ULTRAM) 50 MG tablet   SOB (shortness of breath) on exertion    Other Visit Diagnoses    Type 2 diabetes mellitus with other circulatory complications (HCC)       Increase both Lantus and Novolog   Relevant Medications   insulin aspart (NOVOLOG) 100 UNIT/ML injection   Liraglutide 18 MG/3ML SOPN   atorvastatin (LIPITOR) 80 MG tablet   Other Relevant Orders   Microalbumin/Creatinine Ratio, Urine   Uncontrolled type 2 diabetes mellitus with ketoacidosis without coma, with long-term current use of insulin (HCC)       Relevant Medications   insulin aspart (NOVOLOG) 100 UNIT/ML injection   Liraglutide 18 MG/3ML SOPN   atorvastatin (LIPITOR) 80 MG tablet   Encounter for immunization       Relevant Orders   Flu Vaccine QUAD 36+ mos IM (Completed)      Total face-to-face time with patient: 25 minutes. Over 50% of encounter was spent on counseling and coordination  of care. Return in about 3 months (around 08/22/2016) for see Dr. Valentina Lucks in 4 wks, a follow-up.  [name] 05/23/2016 11:43 AM

## 2016-05-23 NOTE — Assessment & Plan Note (Signed)
To f/u per cards, continue Coreg, BP control and atorvastatin

## 2016-05-23 NOTE — Assessment & Plan Note (Signed)
Continue Lipitor 80 mg ?

## 2016-05-23 NOTE — Assessment & Plan Note (Signed)
-

## 2016-05-23 NOTE — Assessment & Plan Note (Signed)
States he needs to have his CPAP titrated yearly per [place]

## 2016-05-23 NOTE — Patient Instructions (Signed)

## 2016-05-23 NOTE — Assessment & Plan Note (Signed)
Refill Ultram

## 2016-05-24 LAB — MICROALBUMIN / CREATININE URINE RATIO
Creatinine, Urine: 190 mg/dL (ref 20–370)
MICROALB/CREAT RATIO: 25 ug/mg{creat} (ref <30)
Microalb, Ur: 4.7 mg/dL

## 2016-05-30 ENCOUNTER — Other Ambulatory Visit: Payer: Self-pay | Admitting: *Deleted

## 2016-05-30 DIAGNOSIS — I214 Non-ST elevation (NSTEMI) myocardial infarction: Secondary | ICD-10-CM

## 2016-05-30 MED ORDER — NITROGLYCERIN 0.4 MG SL SUBL: 0.4000 mg | SUBLINGUAL_TABLET | SUBLINGUAL | 3 refills | Status: DC | PRN

## 2016-06-07 ENCOUNTER — Encounter: Payer: Self-pay | Admitting: Pharmacist

## 2016-06-07 ENCOUNTER — Ambulatory Visit (INDEPENDENT_AMBULATORY_CARE_PROVIDER_SITE_OTHER): Payer: Medicaid Other | Admitting: Pharmacist

## 2016-06-07 DIAGNOSIS — J449 Chronic obstructive pulmonary disease, unspecified: Secondary | ICD-10-CM

## 2016-06-07 DIAGNOSIS — J441 Chronic obstructive pulmonary disease with (acute) exacerbation: Secondary | ICD-10-CM

## 2016-06-07 DIAGNOSIS — E1159 Type 2 diabetes mellitus with other circulatory complications: Secondary | ICD-10-CM

## 2016-06-07 DIAGNOSIS — Z794 Long term (current) use of insulin: Secondary | ICD-10-CM

## 2016-06-07 DIAGNOSIS — R0602 Shortness of breath: Secondary | ICD-10-CM

## 2016-06-07 DIAGNOSIS — I10 Essential (primary) hypertension: Secondary | ICD-10-CM

## 2016-06-07 DIAGNOSIS — Z720 Tobacco use: Secondary | ICD-10-CM

## 2016-06-07 MED ORDER — MOMETASONE FURO-FORMOTEROL FUM 200-5 MCG/ACT IN AERO: 2.0000 | INHALATION_SPRAY | Freq: Two times a day (BID) | RESPIRATORY_TRACT | 0 refills | Status: DC

## 2016-06-07 NOTE — Assessment & Plan Note (Signed)
Spirometry evaluation reveals improved lung function from 2015 spirometry (FEV1 1.60L, FVC 2.29L previously). Continue Dulera inhaler 2 puffs twice daily at this time. Dulera sample provided. Reviewed results of pulmonary function tests.  Pt verbalized understanding of results and education.  Improvement is likely due to tobacco cessation.   Encouraged continued abstinence from tobacco.  Patient is good candidate for continued cessation.  Consider reducing dose of Dulera in 3-4 months if he remains abstinent from tobacco.

## 2016-06-07 NOTE — Progress Notes (Signed)
   S:    Patient arrives in fair spirits ambulating without assistance. Presents for lung function evaluation. Patient was referred on 05/23/16 by Dr. Kennon Rounds. Patient was last seen by Primary Care Provider on 05/23/16.  Patient reports breathing has become more labored with the hot weather, feeling SOB after 10-15 minutes of walking. Patient currently successful maintaining smoking cessation for the past 46 days.  Last dose of COPD medications was this morning (Dulera 200/5 mcg/act 2 puffs twice daily).   Reports blood sugars ranging from 100-200s with no hypoglycemic events.  Meals/day: 2-3 Breakfast: peanut butter sandwich Lunch: None. (Often eats out at [employer] but is attempting to limit fast food meals) Dinner: Baked chicken, broccoli, chili Snacks: midnight sandwiches.  Nocturia: 1x/night Peripheral neuropathy: in feet (2-3x/week) Retinopathy: Eye doctor last seen in July 2017 who stated vision is stable. Pt reports eyesight has worsened.  Daily feet exams: yes.   Pt reports moderate adherence, sometimes forgetting his second doses of carvedilol and lisinopril.  Denied hypotensive symptoms.   O: Vitals:   06/07/16 1349  BP: 109/71  Pulse: 82   FEV1: 2.29L FVC: 3.15L FEV1/FVC: 73%  A/P: Spirometry evaluation reveals improved lung function from 2015 spirometry (FEV1 1.60L, FVC 2.29L previously). Continue Dulera inhaler 2 puffs twice daily at this time. Dulera sample provided. Reviewed results of pulmonary function tests.  Pt verbalized understanding of results and education.  Improvement is likely due to tobacco cessation.   Encouraged continued abstinence from tobacco.  Patient is good candidate for continued cessation.  Consider reducing dose of Dulera in 3-4 months if he remains abstinent from tobacco.    DM: Uncontrolled based on patient's elevated home readings and A1c of 9.0. Next A1c anticipated: 06/29/16.  Increased Novolog to 40 units twice daily for additional prandial  coverage. Continued Lantus 45 units BID. Continued metformin 1000 mg BID. Continued liraglutide 1.8 mg daily.   HTN: Controlled with BP 109/71 in clinic today. Decreased lisinopril to 2.5 mg daily to prevent hypotensive events. Continued carvedilol 3.125 mg twice daily.    Written pt instructions provided.  F/U Clinic visit in 3 weeks.   Total time in face to face counseling 45 minutes.  Patient seen with Mechele Dawley, PharmD Candidate and Joellyn Haff, PharmD Candidate.

## 2016-06-07 NOTE — Assessment & Plan Note (Signed)
HTN: Controlled with BP 109/71 in clinic today. Decreased lisinopril to 2.5 mg daily to prevent hypotensive events. Continued carvedilol 3.125 mg twice daily.

## 2016-06-07 NOTE — Assessment & Plan Note (Signed)
DM: Uncontrolled based on patient's elevated home readings and A1c of 9.0. Next A1c anticipated: 06/29/16.  Increased Novolog to 40 units twice daily for additional prandial coverage. Continued Lantus 45 units BID. Continued metformin 1000 mg BID. Continued liraglutide 1.8 mg daily.

## 2016-06-07 NOTE — Patient Instructions (Signed)
Continue taking Lantus 45 units twice a day. Increase Novolog to 40 units twice a day. Take lisinopril 0.5 tablets (2.5 mg total) ONCE a day.   Great job with your smoking cessation! Keep up the great work!  Follow up in 3 weeks with Dr. Valentina Lucks. (October 17th)

## 2016-06-07 NOTE — Assessment & Plan Note (Signed)
Encouraged continued abstinence from tobacco. - Quit for 46 days at this visit.  Patient is good candidate for continued cessation.  Consider reducing dose of Dulera in 3-4 months if he remains abstinent from tobacco.

## 2016-06-08 NOTE — Progress Notes (Signed)
Patient ID: [name] [name], male   DOB: 10/24/1960, 55 y.o.   MRN: [id number] Reviewed: Agree with Dr. Graylin Shiver documentation and management.

## 2016-06-17 ENCOUNTER — Other Ambulatory Visit: Payer: Self-pay | Admitting: Family Medicine

## 2016-06-17 MED ORDER — ACCU-CHEK NANO SMARTVIEW W/DEVICE KIT: 1.0000 [IU] | PACK | 0 refills | Status: DC

## 2016-06-17 NOTE — Telephone Encounter (Signed)
Pt's glucose meter is broken, pt would like a Rx for a new one. Please advise. Thanks! ep

## 2016-06-28 ENCOUNTER — Ambulatory Visit (INDEPENDENT_AMBULATORY_CARE_PROVIDER_SITE_OTHER): Payer: Medicaid Other | Admitting: Pharmacist

## 2016-06-28 ENCOUNTER — Encounter: Payer: Self-pay | Admitting: Pharmacist

## 2016-06-28 DIAGNOSIS — Z87891 Personal history of nicotine dependence: Secondary | ICD-10-CM | POA: Diagnosis present

## 2016-06-28 DIAGNOSIS — I10 Essential (primary) hypertension: Secondary | ICD-10-CM

## 2016-06-28 DIAGNOSIS — E1159 Type 2 diabetes mellitus with other circulatory complications: Secondary | ICD-10-CM | POA: Diagnosis not present

## 2016-06-28 DIAGNOSIS — Z794 Long term (current) use of insulin: Secondary | ICD-10-CM | POA: Diagnosis not present

## 2016-06-28 NOTE — Progress Notes (Signed)
    S:    Patient arrives in no distress and in good spirits, ambulating independently.  Presents for diabetes evaluation, education, and management at the request of PCP - Dr. Kennon Rounds.  Patient reports adherence with medications.  Current diabetes medications include: Metformin 1000mg  bid, Lantus 50 units bid, Novolog 40 units with meals, Victoza 1.8mg  daily Current hypertension medications include: Carvedilol 3.125mg  ONCE daily and lisinopril 2.5mg  once daily.   Patient denies hypoglycemic events.  Patient reported dietary habits: Eats 2 meals/day Lunch (11-12): chicken and broccoli  Dinner (8-9): greens and protein Snacks: eating 8-10 Lifesaver mints/day (goal is to cut back to 3-5 per day by next visit), orange (~2pm), sandwich late night (right before bed or wakes up in middle of night)  Patient reported exercise habits: minimal   Denies breathing issues.   Denies use of albuterol since last visit.  Willing to cut down Dulera dose to 1 inhalation twice daily (200/5)  O:  Lab Results  Component Value Date   HGBA1C 9.0 (H) 03/29/2016   Vitals:   06/28/16 1438  BP: (!) 101/56  Pulse: 67   Home fasting CBG: 80-200s, mostly 100s, some 200s, one 350      A/P: Diabetes longstanding currently improving however remains uncontrolled most likely related to use of mints for assistance with tobacco cessation. Patient denies hypoglycemic events and is able to verbalize appropriate hypoglycemia management plan. Patient reports adherence with medication.  Continue basal insulin Lantus (insulin glargine) at 50 units twice daily  Continued rapid insulin Novolog (insulin aspart) a 40 units twice daily.  Takes insulin prior to two meals per day.   Continue Victoza (liraglutide) at 1.8mg  per day.  Discussed cutting back on late-night snacks or replacing with healthier options of yogurt or health vegetable.  Next A1C anticipated in 1 month/ plan for lipid panel at that time as well.    Longstanding tobacco dependence now quit 66 days per his reports.  Using 8-10 mints per day.  Agreed to reduce use to 3-5 per day in the near future. This should also assist with blood glucose control.   Hypertension longstanding currently with excellent control, low readings are asymptomatic.  Contine low doses of lisinopril and carvedilol at this time.    Written patient instructions provided.  Total time in face to face counseling 45 minutes.   Follow up in Pharmacist Clinic Visit 1 month.   Patient seen with Shonna Chock, PharmD Candidate, Darcella Cheshire, PharmD Candidate and Belia Heman, PharmD PGY1 Resident.

## 2016-06-28 NOTE — Assessment & Plan Note (Signed)
Hypertension longstanding currently with excellent control, low readings are asymptomatic.  Contine low doses of lisinopril and carvedilol at this time.

## 2016-06-28 NOTE — Assessment & Plan Note (Signed)
Longstanding tobacco dependence now quit 66 days per his reports.  Using 8-10 mints per day.  Agreed to reduce use to 3-5 per day in the near future. This should also assist with blood glucose control.

## 2016-06-28 NOTE — Patient Instructions (Addendum)
It was good to see you today!  Your blood sugar is doing good. Continue taking Lantus 50 units twice daily, Novolog 40 units twice daily with meals, metformin 1000mg  twice daily, Victoza 1.8mg  daily.   Continue to work on [employer]. Goal is to reduce nightly sandwiches by trying vegetables instead. Also, try to cut down to 3-5 mints per day.  Meter battery size 2032 (you will need 2). These can be picked up at a pharmacy.   Reduce Dulera to 1 puff twice daily, starting tonight.   Follow-up in 1 month with Dr. Valentina Lucks

## 2016-06-28 NOTE — Assessment & Plan Note (Signed)
Diabetes longstanding currently improving however remains uncontrolled most likely related to use of mints for assistance with tobacco cessation. Patient denies hypoglycemic events and is able to verbalize appropriate hypoglycemia management plan. Patient reports adherence with medication.  Continue basal insulin Lantus (insulin glargine) at 50 units twice daily  Continued rapid insulin Novolog (insulin aspart) a 40 units twice daily.  Takes insulin prior to two meals per day.   Continue Victoza (liraglutide) at 1.8mg  per day.  Discussed cutting back on late-night snacks or replacing with healthier options of yogurt or health vegetable.  Next A1C anticipated in 1 month/ plan for lipid panel at that time as well.

## 2016-06-29 NOTE — Progress Notes (Signed)
Patient ID: [name] [name], male   DOB: June 04, 1961, 55 y.o.   MRN: [id number] I have reviewed this visit and discussed with Howell Rucks, RN, BSN, and agree with her documentation.

## 2016-07-12 ENCOUNTER — Telehealth: Payer: Self-pay | Admitting: Family Medicine

## 2016-07-12 DIAGNOSIS — E1159 Type 2 diabetes mellitus with other circulatory complications: Secondary | ICD-10-CM

## 2016-07-12 DIAGNOSIS — Z794 Long term (current) use of insulin: Principal | ICD-10-CM

## 2016-07-12 MED ORDER — GLUCOSE BLOOD VI STRP: ORAL_STRIP | 12 refills | Status: DC

## 2016-07-12 MED ORDER — INSULIN ASPART 100 UNIT/ML ~~LOC~~ SOLN: SUBCUTANEOUS | 5 refills | Status: DC

## 2016-07-12 NOTE — Telephone Encounter (Signed)
Pt called and would like to have refills on his Novolog and test strips called in. jw

## 2016-07-13 ENCOUNTER — Other Ambulatory Visit: Payer: Self-pay | Admitting: Family Medicine

## 2016-07-14 ENCOUNTER — Telehealth: Payer: Self-pay

## 2016-07-14 NOTE — Telephone Encounter (Signed)
Records sent to [employer], faxed to [phone].

## 2016-07-16 ENCOUNTER — Other Ambulatory Visit: Payer: Self-pay | Admitting: Family Medicine

## 2016-07-16 ENCOUNTER — Other Ambulatory Visit: Payer: Self-pay | Admitting: Cardiology

## 2016-07-18 ENCOUNTER — Other Ambulatory Visit: Payer: Self-pay | Admitting: *Deleted

## 2016-07-18 DIAGNOSIS — I10 Essential (primary) hypertension: Secondary | ICD-10-CM

## 2016-07-18 MED ORDER — LISINOPRIL 5 MG PO TABS: ORAL_TABLET | ORAL | 11 refills | Status: DC

## 2016-07-18 NOTE — Telephone Encounter (Signed)
Rx request sent to pharmacy.  

## 2016-07-19 ENCOUNTER — Other Ambulatory Visit: Payer: Self-pay | Admitting: *Deleted

## 2016-07-19 MED ORDER — METFORMIN HCL 1000 MG PO TABS: 1000.0000 mg | ORAL_TABLET | Freq: Two times a day (BID) | ORAL | 11 refills | Status: DC

## 2016-07-22 ENCOUNTER — Ambulatory Visit: Payer: Medicaid Other | Admitting: Pharmacist

## 2016-07-28 ENCOUNTER — Ambulatory Visit (INDEPENDENT_AMBULATORY_CARE_PROVIDER_SITE_OTHER): Payer: Medicaid Other | Admitting: Pharmacist

## 2016-07-28 ENCOUNTER — Encounter: Payer: Self-pay | Admitting: Pharmacist

## 2016-07-28 DIAGNOSIS — E1159 Type 2 diabetes mellitus with other circulatory complications: Secondary | ICD-10-CM

## 2016-07-28 DIAGNOSIS — Z87891 Personal history of nicotine dependence: Secondary | ICD-10-CM | POA: Diagnosis not present

## 2016-07-28 DIAGNOSIS — J449 Chronic obstructive pulmonary disease, unspecified: Secondary | ICD-10-CM

## 2016-07-28 DIAGNOSIS — Z794 Long term (current) use of insulin: Secondary | ICD-10-CM | POA: Diagnosis not present

## 2016-07-28 LAB — POCT GLYCOSYLATED HEMOGLOBIN (HGB A1C): Hemoglobin A1C: 8.3

## 2016-07-28 MED ORDER — MOMETASONE FURO-FORMOTEROL FUM 200-5 MCG/ACT IN AERO: 1.0000 | INHALATION_SPRAY | Freq: Two times a day (BID) | RESPIRATORY_TRACT | 0 refills | Status: DC

## 2016-07-28 MED ORDER — GLUCOSE BLOOD VI STRP: ORAL_STRIP | 12 refills | Status: DC

## 2016-07-28 MED ORDER — INSULIN ASPART 100 UNIT/ML ~~LOC~~ SOLN: SUBCUTANEOUS | 0 refills | Status: DC

## 2016-07-28 NOTE — Patient Instructions (Addendum)
  Continue Lantus (insulin glargine) 50 units twice daily Increased Novolog (insulin aspart) to 45 units twice daily before meals.   Continue Victoza (liraglutide) 1.8 mg daily.    Followup with Dr Kennon Rounds in 1 month.  Followup with Dr Valentina Lucks in January.

## 2016-07-28 NOTE — Assessment & Plan Note (Signed)
Longstanding tobacco dependence now quit.  Using 12-14 mints/gum per day. Encourage patient to continue to remain tobacco free  COPD with improved lung function since stopping smoking:  Dulera 200/5 mcg previously reduced to 1 puff BID.  Patient provided with sample today.

## 2016-07-28 NOTE — Progress Notes (Addendum)
    S:    Patient arrives in good spirits ambulating without assistance.  Presents for diabetes evaluation, education, and management at the request of PCP - Dr. Kennon Rounds. Reports he is using sugar free lifesaver mints to help him remain smoke free.    Patient reports adherence with medications.  Current diabetes medications include: Victoza 1.8 mg daily, Lantus 50 units BID, Novolog 40 units BID, Metformin 1000 mg BID Current hypertension medications include: lisinopril 2.5 mg BID, carvedilol 3.125 mg daily (presribed BID)  Patient denies hypoglycemic events. Reports proper treatment knowledge.   Patient reported dietary habits: Eats 2 meals/day.  Reports his weakness is that he is getting up in the middle of the night and eating a peanut butter sandwich.  Breakfast: peanut butter toast sandwich with white bread Dinner: chicken and broccoli or spaghetti or baked chicken Drinks:Water, Gatorade, milk  Patient reported exercise habits: Walking every other day for 15 minutes. Walking is limited by shortness of breath.    Patient reports nocturia 1x/night.  Patient denies neuropathy. Patient reports visual changes with blurred.  Reports last eye exam in July 2017.  Patient reports self foot exams. Denies changes or problems.    States he only has 1 bottle of Novolog remaining and has 2 bottles of Lantus. Needs test strips reordered.   O:  Lab Results  Component Value Date   HGBA1C 8.3 07/28/2016   Vitals:   07/28/16 1337  BP: (!) 121/55  Pulse: 77    Home fasting CBG: Average (4 tests) 178 mg/dL  CBG before breakfast: Average (2 tests) 90 mg/dL  .  A/P: Diabetes longstanding diagnosed currently uncontrolled. Patient denies hypoglycemic events and is able to verbalize appropriate hypoglycemia management plan. Patient reports adherence with medication. Control is suboptimal due to noncompliance to diet. Discussed low carbohydrate diet and patient will try to decrease midnight snacking  or try eating tangerine or grapes with water.   Continued basal insulin Lantus (insulin glargine) 50 units twice daily. Increased dose of rapid insulin Novolog (insulin aspart) to 45 units twice daily before meals. Continued Victoza (liraglutide) 1.8 mg daily.  Next A1C anticipated 3 months.    ASCVD risk greater than 7.5%. Continued Aspirin 81 mg and Continued atorvastatin 80 mg daily.   Hypertension longstanding diagnosed currently controlled.  Patient reports adherence with medication. Continue current medications  Longstanding tobacco dependence now quit.  Using 12-14 mints/gum per day. Encourage patient to continue to remain tobacco free  COPD with improved lung function since stopping smoking:  Dulera 200/5 mcg previously reduced to 1 puff BID.  Patient provided with sample today.   Written patient instructions provided.  Total time in face to face counseling 45 minutes.   Follow up in Pharmacist Clinic Visit in January after visit with Dr Kennon Rounds next month.   Patient seen with Shonna Chock, PharmD Candidate,  Glorious Peach, PharmD PGY1 Resident and Bennye Alm, PharmD PGY2 Resident.

## 2016-07-28 NOTE — Progress Notes (Signed)
Patient ID: [name] [name], male   DOB: 05/29/1961, 55 y.o.   MRN: [id number] Reviewed: Agree with Dr. Koval's documentation and management. 

## 2016-07-28 NOTE — Assessment & Plan Note (Signed)
Diabetes longstanding diagnosed currently uncontrolled. Patient denies hypoglycemic events and is able to verbalize appropriate hypoglycemia management plan. Patient reports adherence with medication. Control is suboptimal due to noncompliance to diet. Discussed low carbohydrate diet and patient will try to decrease midnight snacking or try eating tangerine or grapes with water.   Continued basal insulin Lantus (insulin glargine) 50 units twice daily. Increased dose of rapid insulin Novolog (insulin aspart) to 45 units twice daily before meals. Continued Victoza (liraglutide) 1.8 mg daily.  Next A1C anticipated 3 months.

## 2016-08-09 ENCOUNTER — Other Ambulatory Visit: Payer: Self-pay | Admitting: *Deleted

## 2016-08-09 MED ORDER — GLUCOSE BLOOD VI STRP: ORAL_STRIP | 12 refills | Status: DC

## 2016-08-10 ENCOUNTER — Telehealth: Payer: Self-pay | Admitting: *Deleted

## 2016-08-10 NOTE — Telephone Encounter (Signed)
Prior Authorization received from [place] for [place] 18 mg/77mL. PA approved for Victoza via [place] [employer], valid 08/10/16-08/10/17.  Approval number [id number].   Derl Barrow, RN

## 2016-08-11 ENCOUNTER — Institutional Professional Consult (permissible substitution): Payer: Medicaid Other | Admitting: Pulmonary Disease

## 2016-08-22 ENCOUNTER — Ambulatory Visit (INDEPENDENT_AMBULATORY_CARE_PROVIDER_SITE_OTHER): Payer: Medicaid Other | Admitting: Family Medicine

## 2016-08-22 ENCOUNTER — Encounter: Payer: Self-pay | Admitting: Family Medicine

## 2016-08-22 VITALS — BP 134/60 | HR 77 | Temp 98.3°F | Ht 66.5 in | Wt 276.0 lb

## 2016-08-22 DIAGNOSIS — Z9861 Coronary angioplasty status: Secondary | ICD-10-CM | POA: Diagnosis not present

## 2016-08-22 DIAGNOSIS — I2510 Atherosclerotic heart disease of native coronary artery without angina pectoris: Secondary | ICD-10-CM | POA: Diagnosis not present

## 2016-08-22 DIAGNOSIS — E1159 Type 2 diabetes mellitus with other circulatory complications: Secondary | ICD-10-CM | POA: Diagnosis not present

## 2016-08-22 DIAGNOSIS — G4733 Obstructive sleep apnea (adult) (pediatric): Secondary | ICD-10-CM

## 2016-08-22 DIAGNOSIS — Z794 Long term (current) use of insulin: Secondary | ICD-10-CM | POA: Diagnosis not present

## 2016-08-22 DIAGNOSIS — I10 Essential (primary) hypertension: Secondary | ICD-10-CM

## 2016-08-22 DIAGNOSIS — H9193 Unspecified hearing loss, bilateral: Secondary | ICD-10-CM | POA: Diagnosis not present

## 2016-08-22 DIAGNOSIS — Z9989 Dependence on other enabling machines and devices: Secondary | ICD-10-CM

## 2016-08-22 DIAGNOSIS — J449 Chronic obstructive pulmonary disease, unspecified: Secondary | ICD-10-CM | POA: Diagnosis not present

## 2016-08-22 MED ORDER — MOMETASONE FURO-FORMOTEROL FUM 200-5 MCG/ACT IN AERO: 1.0000 | INHALATION_SPRAY | Freq: Two times a day (BID) | RESPIRATORY_TRACT | 0 refills | Status: DC

## 2016-08-22 MED ORDER — LISINOPRIL 2.5 MG PO TABS: 2.5000 mg | ORAL_TABLET | Freq: Two times a day (BID) | ORAL | 3 refills | Status: DC

## 2016-08-22 MED ORDER — INSULIN ASPART 100 UNIT/ML ~~LOC~~ SOLN: SUBCUTANEOUS | 0 refills | Status: DC

## 2016-08-22 NOTE — Patient Instructions (Addendum)
DASH Eating Plan DASH stands for "Dietary Approaches to Stop Hypertension." The DASH eating plan is a healthy eating plan that has been shown to reduce high blood pressure (hypertension). Additional health benefits may include reducing the risk of type 2 diabetes mellitus, heart disease, and stroke. The DASH eating plan may also help with weight loss. What do I need to know about the DASH eating plan? For the DASH eating plan, you will follow these general guidelines:  Choose foods with less than 150 milligrams of sodium per serving (as listed on the food label).  Use salt-free seasonings or herbs instead of table salt or sea salt.  Check with your health care provider or pharmacist before using salt substitutes.  Eat lower-sodium products. These are often labeled as "low-sodium" or "no salt added."  Eat fresh foods. Avoid eating a lot of canned foods.  Eat more vegetables, fruits, and low-fat dairy products.  Choose whole grains. Look for the word "whole" as the first word in the ingredient list.  Choose fish and skinless chicken or turkey more often than red meat. Limit fish, poultry, and meat to 6 oz (170 g) each day.  Limit sweets, desserts, sugars, and sugary drinks.  Choose heart-healthy fats.  Eat more home-cooked food and less restaurant, buffet, and fast food.  Limit fried foods.  Do not fry foods. Cook foods using methods such as baking, boiling, grilling, and broiling instead.  When eating at a restaurant, ask that your food be prepared with less salt, or no salt if possible. What foods can I eat? Seek help from a dietitian for individual calorie needs. Grains  Whole grain or whole wheat bread. Brown rice. Whole grain or whole wheat pasta. Quinoa, bulgur, and whole grain cereals. Low-sodium cereals. Corn or whole wheat flour tortillas. Whole grain cornbread. Whole grain crackers. Low-sodium crackers. Vegetables  Fresh or frozen vegetables (raw, steamed, roasted, or  grilled). Low-sodium or reduced-sodium tomato and vegetable juices. Low-sodium or reduced-sodium tomato sauce and paste. Low-sodium or reduced-sodium canned vegetables. Fruits  All fresh, canned (in natural juice), or frozen fruits. Meat and Other Protein Products  Ground beef (85% or leaner), grass-fed beef, or beef trimmed of fat. Skinless chicken or turkey. Ground chicken or turkey. Pork trimmed of fat. All fish and seafood. Eggs. Dried beans, peas, or lentils. Unsalted nuts and seeds. Unsalted canned beans. Dairy  Low-fat dairy products, such as skim or 1% milk, 2% or reduced-fat cheeses, low-fat ricotta or cottage cheese, or plain low-fat yogurt. Low-sodium or reduced-sodium cheeses. Fats and Oils  Tub margarines without trans fats. Light or reduced-fat mayonnaise and salad dressings (reduced sodium). Avocado. Safflower, olive, or canola oils. Natural peanut or almond butter. Other  Unsalted popcorn and pretzels. The items listed above may not be a complete list of recommended foods or beverages. Contact your dietitian for more options.  What foods are not recommended? Grains  White bread. White pasta. White rice. Refined cornbread. Bagels and croissants. Crackers that contain trans fat. Vegetables  Creamed or fried vegetables. Vegetables in a cheese sauce. Regular canned vegetables. Regular canned tomato sauce and paste. Regular tomato and vegetable juices. Fruits  Canned fruit in light or heavy syrup. Fruit juice. Meat and Other Protein Products  Fatty cuts of meat. Ribs, chicken wings, bacon, sausage, bologna, salami, chitterlings, fatback, hot dogs, bratwurst, and packaged luncheon meats. Salted nuts and seeds. Canned beans with salt. Dairy  Whole or 2% milk, cream, half-and-half, and cream cheese. Whole-fat or sweetened yogurt. Full-fat cheeses   or blue cheese. Nondairy creamers and whipped toppings. Processed cheese, cheese spreads, or cheese curds. Condiments  Onion and garlic  salt, seasoned salt, table salt, and sea salt. Canned and packaged gravies. Worcestershire sauce. Tartar sauce. Barbecue sauce. Teriyaki sauce. Soy sauce, including reduced sodium. Steak sauce. Fish sauce. Oyster sauce. Cocktail sauce. Horseradish. Ketchup and mustard. Meat flavorings and tenderizers. Bouillon cubes. Hot sauce. Tabasco sauce. Marinades. Taco seasonings. Relishes. Fats and Oils  Butter, stick margarine, lard, shortening, ghee, and bacon fat. Coconut, palm kernel, or palm oils. Regular salad dressings. Other  Pickles and olives. Salted popcorn and pretzels. The items listed above may not be a complete list of foods and beverages to avoid. Contact your dietitian for more information.  Where can I find more information? National Heart, Lung, and Blood Institute: travelstabloid.com This information is not intended to replace advice given to you by your health care provider. Make sure you discuss any questions you have with your health care provider. Document Released: 08/18/2011 Document Revised: 02/04/2016 Document Reviewed: 07/03/2013 Elsevier Interactive Patient Education  2017 [place] for [employer] Calories are energy you get from the things you eat and drink. Your body uses this energy to keep you going throughout the day. The number of calories you eat affects your weight. When you eat more calories than your body needs, your body stores the extra calories as fat. When you eat fewer calories than your body needs, your body burns fat to get the energy it needs. Calorie counting means keeping track of how many calories you eat and drink each day. If you make sure to eat fewer calories than your body needs, you should lose weight. In order for calorie counting to work, you will need to eat the number of calories that are right for you in a day to lose a healthy amount of weight per week. A healthy amount of weight to lose per week  is usually 1-2 lb (0.5-0.9 kg). A dietitian can determine how many calories you need in a day and give you suggestions on how to reach your calorie goal.  WHAT IS MY MY PLAN? My goal is to have __________ calories per day.  If I have this many calories per day, I should lose around __________ pounds per week. WHAT DO I NEED TO KNOW ABOUT CALORIE COUNTING? In order to meet your daily calorie goal, you will need to:  Find out how many calories are in each food you would like to eat. Try to do this before you eat.  Decide how much of the food you can eat.  Write down what you ate and how many calories it had. Doing this is called keeping a food log. WHERE DO I FIND CALORIE INFORMATION? The number of calories in a food can be found on a Nutrition Facts label. Note that all the information on a label is based on a specific serving of the food. If a food does not have a Nutrition Facts label, try to look up the calories online or ask your dietitian for help. HOW DO I DECIDE HOW MUCH TO EAT? To decide how much of the food you can eat, you will need to consider both the number of calories in one serving and the size of one serving. This information can be found on the Nutrition Facts label. If a food does not have a Nutrition Facts label, look up the information online or ask your dietitian for help. Remember that calories are  listed per serving. If you choose to have more than one serving of a food, you will have to multiply the calories per serving by the amount of servings you plan to eat. For example, the label on a package of bread might say that a serving size is 1 slice and that there are 90 calories in a serving. If you eat 1 slice, you will have eaten 90 calories. If you eat 2 slices, you will have eaten 180 calories. HOW DO I KEEP A FOOD LOG? After each meal, record the following information in your food log:  What you ate.  How much of it you ate.  How many calories it had.  Then, add up  your calories. Keep your food log near you, such as in a small notebook in your pocket. Another option is to use a mobile app or website. Some programs will calculate calories for you and show you how many calories you have left each time you add an item to the log. WHAT ARE SOME CALORIE COUNTING TIPS?  Use your calories on foods and drinks that will fill you up and not leave you hungry. Some examples of this include foods like nuts and nut butters, vegetables, lean proteins, and high-fiber foods (more than 5 g fiber per serving).  Eat nutritious foods and avoid empty calories. Empty calories are calories you get from foods or beverages that do not have many nutrients, such as candy and soda. It is better to have a nutritious high-calorie food (such as an avocado) than a food with few nutrients (such as a bag of chips).  Know how many calories are in the foods you eat most often. This way, you do not have to look up how many calories they have each time you eat them.  Look out for foods that may seem like low-calorie foods but are really high-calorie foods, such as baked goods, soda, and fat-free candy.  Pay attention to calories in drinks. Drinks such as sodas, specialty coffee drinks, alcohol, and juices have a lot of calories yet do not fill you up. Choose low-calorie drinks like water and diet drinks.  Focus your calorie counting efforts on higher calorie items. Logging the calories in a garden salad that contains only vegetables is less important than calculating the calories in a milk shake.  Find a way of tracking calories that works for you. Get creative. Most people who are successful find ways to keep track of how much they eat in a day, even if they do not count every calorie. WHAT ARE SOME PORTION CONTROL TIPS?  Know how many calories are in a serving. This will help you know how many servings of a certain food you can have.  Use a measuring cup to measure serving sizes. This is  helpful when you start out. With time, you will be able to estimate serving sizes for some foods.  Take some time to put servings of different foods on your favorite plates, bowls, and cups so you know what a serving looks like.  Try not to eat straight from a bag or box. Doing this can lead to overeating. Put the amount you would like to eat in a cup or on a plate to make sure you are eating the right portion.  Use smaller plates, glasses, and bowls to prevent overeating. This is a quick and easy way to practice portion control. If your plate is smaller, less food can fit on it.  Try not  to multitask while eating, such as watching TV or using your computer. If it is time to eat, sit down at a table and enjoy your food. Doing this will help you to start recognizing when you are full. It will also make you more aware of what and how much you are eating. HOW CAN I CALORIE COUNT WHEN EATING OUT?  Ask for smaller portion sizes or child-sized portions.  Consider sharing an entree and sides instead of getting your own entree.  If you get your own entree, eat only half. Ask for a box at the beginning of your meal and put the rest of your entree in it so you are not tempted to eat it.  Look for the calories on the menu. If calories are listed, choose the lower calorie options.  Choose dishes that include vegetables, fruits, whole grains, low-fat dairy products, and lean protein. Focusing on smart food choices from each of the 5 food groups can help you stay on track at restaurants.  Choose items that are boiled, broiled, grilled, or steamed.  Choose water, milk, unsweetened iced tea, or other drinks without added sugars. If you want an alcoholic beverage, choose a lower calorie option. For example, a regular margarita can have up to 700 calories and a glass of wine has around 150.  Stay away from items that are buttered, battered, fried, or served with cream sauce. Items labeled "crispy" are usually  fried, unless stated otherwise.  Ask for dressings, sauces, and syrups on the side. These are usually very high in calories, so do not eat much of them.  Watch out for salads. Many people think salads are a healthy option, but this is often not the case. Many salads come with bacon, fried chicken, lots of cheese, fried chips, and dressing. All of these items have a lot of calories. If you want a salad, choose a garden salad and ask for grilled meats or steak. Ask for the dressing on the side, or ask for olive oil and vinegar or lemon to use as dressing.  Estimate how many servings of a food you are given. For example, a serving of cooked rice is  cup or about the size of half a tennis ball or one cupcake wrapper. Knowing serving sizes will help you be aware of how much food you are eating at restaurants. The list below tells you how big or small some common portion sizes are based on everyday objects.  1 oz-4 stacked dice.  3 oz-1 deck of cards.  1 tsp-1 dice.  1 Tbsp- a Ping-Pong ball.  2 Tbsp-1 Ping-Pong ball.   cup-1 tennis ball or 1 cupcake wrapper.  1 cup-1 baseball. This information is not intended to replace advice given to you by your health care provider. Make sure you discuss any questions you have with your health care provider. Document Released: 08/29/2005 Document Revised: 09/19/2014 Document Reviewed: 07/04/2013 Elsevier Interactive Patient Education  2017 [employer].

## 2016-08-22 NOTE — Assessment & Plan Note (Signed)
Continue Lantus, Metformin, Victoza, Novolog, BS monitoring. Last Hgb A1C was slightly improved at 8.3

## 2016-08-22 NOTE — Assessment & Plan Note (Addendum)
Continue lipitor, NTG, ASA, BP control, effient

## 2016-08-22 NOTE — Assessment & Plan Note (Signed)
Continue nightly CPAP. No new machine required at this stage.

## 2016-08-22 NOTE — Assessment & Plan Note (Signed)
Continue lisinopril and Carvedilol

## 2016-08-22 NOTE — Assessment & Plan Note (Signed)
Continue Dulera, albuterol

## 2016-08-22 NOTE — Progress Notes (Signed)
   Subjective:    Patient ID: [name] [name] is a 55 y.o. male presenting with Follow-up (diabetes)  on 08/22/2016  HPI: Here today for f/u. Needs to see audiology for hearing aids. He has still stopped smoking. Less alcohol. BS are doing well. He had a FBS this am of 88. Would like to see sleep medicine for a new CPAP machine. Needs refill of Lisinopril.  Review of Systems  Constitutional: Negative for chills and fever.  Respiratory: Negative for shortness of breath.   Cardiovascular: Negative for leg swelling.  Gastrointestinal: Negative for abdominal pain, nausea and vomiting.      Objective:    BP 134/60   Pulse 77   Temp 98.3 F (36.8 C) (Oral)   Ht 5' 6.5" (1.689 m)   Wt 276 lb (125.2 kg)   SpO2 (!) 77%   BMI 43.88 kg/m  Physical Exam  Constitutional: He appears well-developed and well-nourished. No distress.  HENT:  Head: Normocephalic and atraumatic.  Eyes: No scleral icterus.  Neck: Neck supple.  Cardiovascular: Normal rate.   Pulmonary/Chest: Effort normal.  Abdominal: Soft.  Musculoskeletal: He exhibits no edema.  Neurological: He is alert.  Skin: Skin is warm.  Psychiatric: He has a normal mood and affect.  Vitals reviewed.  Hgb A1C was 8.3.     Assessment & Plan:   Problem List Items Addressed This Visit      Medium   Essential hypertension (Chronic)    Continue lisinopril and Carvedilol      Relevant Medications   lisinopril (PRINIVIL,ZESTRIL) 2.5 MG tablet   OSA on CPAP (Chronic)    Continue nightly CPAP. No new machine required at this stage.      CAD S/P percutaneous coronary angioplasty (Chronic)    Continue lipitor, NTG, ASA, BP control, effient      Relevant Medications   lisinopril (PRINIVIL,ZESTRIL) 2.5 MG tablet   DM type 2 (diabetes mellitus, type 2) (HCC)    Continue Lantus, Metformin, Victoza, Novolog, BS monitoring. Last Hgb A1C was slightly improved at 8.3      Relevant Medications   lisinopril (PRINIVIL,ZESTRIL) 2.5  MG tablet   insulin aspart (NOVOLOG) 100 UNIT/ML injection     Unprioritized   COPD, moderate (HCC)    Continue Dulera, albuterol      Relevant Medications   mometasone-formoterol (DULERA) 200-5 MCG/ACT AERO   Hearing difficulty - Primary   Relevant Orders   Ambulatory referral to Audiology      Total face-to-face time with patient: 25 minutes. Over 50% of encounter was spent on counseling and coordination of care. Return in about 3 months (around 11/20/2016) for a follow-up.  [name] 08/22/2016 2:30 PM

## 2016-08-23 ENCOUNTER — Other Ambulatory Visit: Payer: Self-pay | Admitting: *Deleted

## 2016-08-23 DIAGNOSIS — I10 Essential (primary) hypertension: Secondary | ICD-10-CM

## 2016-08-23 MED ORDER — LISINOPRIL 2.5 MG PO TABS: 2.5000 mg | ORAL_TABLET | Freq: Two times a day (BID) | ORAL | 3 refills | Status: DC

## 2016-08-23 NOTE — Telephone Encounter (Signed)
Received fax from [facility] needing a clarification in Lisinopril directions.  Correct directions resent to [employer].  Derl Barrow, RN

## 2016-08-31 ENCOUNTER — Other Ambulatory Visit: Payer: Self-pay | Admitting: *Deleted

## 2016-08-31 MED ORDER — ACCU-CHEK AVIVA PLUS W/DEVICE KIT
PACK | 0 refills | Status: DC
Start: 2016-08-31 — End: 2017-08-12

## 2016-09-01 ENCOUNTER — Telehealth: Payer: Self-pay | Admitting: Cardiology

## 2016-09-01 NOTE — Telephone Encounter (Signed)
Patient calling the office for samples of medication:   1.  What medication and dosage are you requesting samples for?Effient 2.  Are you currently out of this medication? Only a few left

## 2016-09-01 NOTE — Telephone Encounter (Signed)
Returned call to patient.Left message on personal voice mail office out of Effient samples.

## 2016-09-09 ENCOUNTER — Telehealth: Payer: Self-pay | Admitting: *Deleted

## 2016-09-09 MED ORDER — ACCU-CHEK SOFT TOUCH LANCETS MISC: 1.0000 | Freq: Four times a day (QID) | 12 refills | Status: DC

## 2016-09-09 NOTE — Telephone Encounter (Signed)
Lancets sent to pharmacy. Jazmin Hartsell,CMA

## 2016-09-09 NOTE — Telephone Encounter (Signed)
Pharmacy sent fax stating patient need rx for accuchek softclix lancets to go with new device

## 2016-09-10 NOTE — Progress Notes (Signed)
09/14/2016 [name]   1961-01-01  [id number]  Primary Physician [name] Bores, MD Primary Cardiologist: [name]  HPI:  [name] [name] is seen today for follow up CAD. His past medical history is significant for CAD, DM, obesity, tobacco use , hyperlipidemia and hypertension. He is status post stenting of the right coronary artery in May of 2013. He was admitted with a NSTEMI inFebruary of 2015. He was found to have severe disease in LCx and OM2 treated with BMS.  He was placed on dual antiplatelet therapy with aspirin plus Effient.   He presented with increased angina in October 2015. A Myoview study was intermediate risk.  He had repeat cardiac cath in November 2015.  He was found to have severe single-vessel disease involving the bifurcation of OM1 and OM 2 with 99% in-stent restenosis in the bare-metal stent placed in the OM 2. He underwent successful PCI spanning the proximal circumflex stent across OM 1 through the bare-metal stent placed in OM 2 with a Xience alpine DES stent. He was also noted to have a widely patent stent in the RCA and proximal circumflex with otherwise mild-moderate disease. He has preserved LVEF with an ejection fraction of 55-65%. He was instructed to continue with DAPT with ASA + Effient.   On follow up today he reports he is doing very well. No chest pain or SOB. He  quit smoking 4 months ago.  Diabetes control is improved  with last A1c 8.3%. He does want to lose weight.    Current Outpatient Prescriptions  Medication Sig Dispense Refill  . ACCU-CHEK FASTCLIX LANCETS MISC 1 Units by Percutaneous route 4 (four) times daily. 100 each 12  . albuterol (PROVENTIL HFA;VENTOLIN HFA) 108 (90 BASE) MCG/ACT inhaler Inhale 1-2 puffs into the lungs every 6 (six) hours as needed for wheezing or shortness of breath. 1 Inhaler 0  . aspirin 81 MG chewable tablet Chew 1 tablet (81 mg total) by mouth daily. 180 tablet 2  . atorvastatin (LIPITOR) 80 MG tablet Take 1 tablet (80 mg  total) by mouth daily at 6 PM. 30 tablet 5  . B-D INS SYR ULTRAFINE 1CC/31G 31G X 5/16" 1 ML MISC INJECT 5 TIMES DAILY AS DIRECTED. 100 each 2  . Blood Glucose Monitoring Suppl (ACCU-CHEK AVIVA PLUS) w/Device KIT Check fasting, and two hours after breakfast, lunch and dinner. ICD-10 code E11.9. 1 kit 0  . Blood Glucose Monitoring Suppl (ACCU-CHEK NANO SMARTVIEW) w/Device KIT 1 Units by Does not apply route as directed. Check fasting, and two hours after breakfast, lunch and dinner. ICD-10 code E11.9. 1 kit 0  . carvedilol (COREG) 3.125 MG tablet Take 1 tablet (3.125 mg total) by mouth 2 (two) times daily with a meal. (Patient taking differently: Take 3.125 mg by mouth daily. ) 60 tablet 2  . citalopram (CELEXA) 20 MG tablet Take 1 tablet (20 mg total) by mouth daily. 30 tablet 12  . glucose blood (ACCU-CHEK AVIVA) test strip Use test blood sugar 4 times a day. ICD-10 code: E11.9 100 each 12  . insulin aspart (NOVOLOG) 100 UNIT/ML injection INJECT 45 UNITS INTO THE SKIN TWICE DAILY WITH MEALS 10 mL 0  . Insulin Pen Needle (BD ULTRA-FINE PEN NEEDLES) 29G X 12.7MM MISC Use to with Victoza Pen, injection once daily. 100 each 11  . Lancets (ACCU-CHEK SOFT TOUCH) lancets 1 each by Other route 4 (four) times daily. Use as instructed 100 each 12  . LANTUS 100 UNIT/ML injection INJECT 0.35 MILLILITERS (  35 UNITS TOTAL) INTO THE SKIN TWICE A DAY (Patient taking differently: INJECT 0.50 MILLILITERS (50 UNITS TOTAL) INTO THE SKIN TWICE A DAY) 20 mL 2  . Liraglutide 18 MG/3ML SOPN Inject 0.3 mLs (1.8 mg total) into the skin daily. Dispense QS for 1 month supply 3 pen 11  . lisinopril (PRINIVIL,ZESTRIL) 2.5 MG tablet Take 1 tablet (2.5 mg total) by mouth 2 (two) times daily. 180 tablet 3  . metFORMIN (GLUCOPHAGE) 1000 MG tablet Take 1 tablet (1,000 mg total) by mouth 2 (two) times daily with a meal. 60 tablet 11  . mometasone-formoterol (DULERA) 200-5 MCG/ACT AERO Inhale 1 puff into the lungs 2 (two) times daily. 1  Inhaler 0  . Multiple Vitamin (MULTIVITAMIN) tablet Take 1 tablet by mouth daily.    . nitroGLYCERIN (NITROSTAT) 0.4 MG SL tablet Place 1 tablet (0.4 mg total) under the tongue every 5 (five) minutes x 3 doses as needed for chest pain. 100 tablet 3  . Omega-3 Fatty Acids (FISH OIL PO) Take 1,000 mg by mouth 2 (two) times daily.     . prasugrel (EFFIENT) 10 MG TABS tablet Take 1 tablet (10 mg total) by mouth daily. 90 tablet 3  . traMADol (ULTRAM) 50 MG tablet Take 1 tablet (50 mg total) by mouth every 6 (six) hours as needed. 10 tablet 0   No current facility-administered medications for this visit.     Allergies  Allergen Reactions  . Shellfish Allergy Nausea And Vomiting    OYSTERS    Social History   Social History  . Marital status: Single    Spouse name: N/A  . Number of children: 0  . Years of education: 15   Occupational History  . Unemployed     Used to work for [employer]  . Student, computer tech [employer]    Social History Main Topics  . Smoking status: Former Smoker    Packs/day: 0.50    Years: 37.00    Types: Cigarettes    Quit date: 12/18/2014  . Smokeless tobacco: Never Used     Comment: pt reports "I quit 66 days ago" (05/09/16)  . Alcohol use No     Comment: pt reports "I quit 66 days ago" (05/09/16)  . Drug use: No     Comment: Crack Cocaine (denies 05/09/16)  . Sexual activity: Not Currently   Other Topics Concern  . Not on file   Social History Narrative   Single.  Lives with a roommate.  Ambulates independently.     Review of Systems: AS noted in HPI All other systems reviewed and are otherwise negative except as noted above.    Blood pressure 122/66, pulse 70, height 5\' 7"  (1.702 m), weight 275 lb (124.7 kg).  General appearance: alert, obese, cooperative and no distress Neck: no carotid bruit and no JVD Lungs: clear to auscultation bilaterally Heart: regular rate and rhythm, S1, S2 normal, no murmur, click, rub or gallop Extremities:  no LEE Pulses: 2+ and symmetric Skin: warm and dry Neurologic: Grossly normal  Laboratory data:  Lab Results  Component Value Date   WBC 12.5 (H) 08/12/2015   HGB 13.7 08/12/2015   HCT 40.0 08/12/2015   PLT 218 08/12/2015   GLUCOSE 200 (H) 03/29/2016   CHOL 154 03/29/2016   TRIG 264 (H) 03/29/2016   HDL 42 03/29/2016   LDLCALC 59 03/29/2016   ALT 21 03/29/2016   AST 16 03/29/2016   NA 138 03/29/2016   K 4.4 03/29/2016  CL 102 03/29/2016   CREATININE 0.71 03/29/2016   BUN 10 03/29/2016   CO2 27 03/29/2016   TSH 1.100 01/18/2012   INR 0.96 07/28/2014   HGBA1C 8.3 07/28/2016   MICROALBUR 4.7 05/23/2016   Ecg today shows NSR with rate 71. Normal. I have personally reviewed and interpreted this study.   ASSESSMENT AND PLAN:   1. CAD: Status post repeat PCI to the proximal circumflex and OM 2 in Nov. 2015 as outlined above with DES for instent restenosis. He denies any recurrent anginal symptoms.  Continue dual antiplatelet therapy with aspirin plus Effient indefinitely due to extensive nature of stents.  Continue beta blocker, statin and ACE inhibitor for secondary prevention.  2. Hypertension: Blood pressure is well-controlled. Continue lisinopril and atenolol.  3. Hyperlipidemia: on statin. Lipids are in good control.  4. Diabetes: per primary care. Some improvement but still suboptimal. Continue Rx per primary care.  5. Tobacco abuse: Congratulated on smoking cessation.  I will follow up in 6 months.  Lexandra Rettke [place] MD,FACC  09/14/2016 3:47 PM

## 2016-09-14 ENCOUNTER — Encounter: Payer: Self-pay | Admitting: *Deleted

## 2016-09-14 ENCOUNTER — Ambulatory Visit (INDEPENDENT_AMBULATORY_CARE_PROVIDER_SITE_OTHER): Payer: Medicaid Other | Admitting: Cardiology

## 2016-09-14 ENCOUNTER — Encounter: Payer: Self-pay | Admitting: Cardiology

## 2016-09-14 VITALS — BP 122/66 | HR 70 | Ht 67.0 in | Wt 275.0 lb

## 2016-09-14 DIAGNOSIS — I10 Essential (primary) hypertension: Secondary | ICD-10-CM | POA: Diagnosis not present

## 2016-09-14 DIAGNOSIS — I2510 Atherosclerotic heart disease of native coronary artery without angina pectoris: Secondary | ICD-10-CM

## 2016-09-14 DIAGNOSIS — Z794 Long term (current) use of insulin: Secondary | ICD-10-CM

## 2016-09-14 DIAGNOSIS — E1159 Type 2 diabetes mellitus with other circulatory complications: Secondary | ICD-10-CM | POA: Diagnosis not present

## 2016-09-14 DIAGNOSIS — Z9861 Coronary angioplasty status: Secondary | ICD-10-CM

## 2016-09-14 DIAGNOSIS — E785 Hyperlipidemia, unspecified: Secondary | ICD-10-CM | POA: Diagnosis not present

## 2016-09-14 MED ORDER — PRASUGREL HCL 10 MG PO TABS: 10.0000 mg | ORAL_TABLET | Freq: Every day | ORAL | 3 refills | Status: DC

## 2016-09-14 NOTE — Patient Instructions (Signed)
Continue your current therapy  Congratulations on quitting smoking   I will see you in 6 months. 

## 2016-09-15 ENCOUNTER — Ambulatory Visit (INDEPENDENT_AMBULATORY_CARE_PROVIDER_SITE_OTHER): Payer: Medicaid Other | Admitting: Pharmacist

## 2016-09-15 ENCOUNTER — Encounter: Payer: Self-pay | Admitting: Pharmacist

## 2016-09-15 DIAGNOSIS — J449 Chronic obstructive pulmonary disease, unspecified: Secondary | ICD-10-CM | POA: Diagnosis not present

## 2016-09-15 DIAGNOSIS — Z794 Long term (current) use of insulin: Secondary | ICD-10-CM | POA: Diagnosis not present

## 2016-09-15 DIAGNOSIS — Z87891 Personal history of nicotine dependence: Secondary | ICD-10-CM | POA: Diagnosis not present

## 2016-09-15 DIAGNOSIS — E1159 Type 2 diabetes mellitus with other circulatory complications: Secondary | ICD-10-CM | POA: Diagnosis present

## 2016-09-15 DIAGNOSIS — I10 Essential (primary) hypertension: Secondary | ICD-10-CM | POA: Diagnosis not present

## 2016-09-15 MED ORDER — INSULIN ASPART 100 UNIT/ML ~~LOC~~ SOLN: SUBCUTANEOUS | 0 refills | Status: DC

## 2016-09-15 MED ORDER — MOMETASONE FURO-FORMOTEROL FUM 200-5 MCG/ACT IN AERO: 1.0000 | INHALATION_SPRAY | Freq: Two times a day (BID) | RESPIRATORY_TRACT | 0 refills | Status: DC

## 2016-09-15 MED ORDER — INSULIN GLARGINE 100 UNIT/ML ~~LOC~~ SOLN: SUBCUTANEOUS | 0 refills | Status: DC

## 2016-09-15 NOTE — Progress Notes (Signed)
Patient ID: [name] [name], male   DOB: 06/05/1961, 55 y.o.   MRN: [id number] Reviewed: Agree with Dr. Koval's documentation and management. 

## 2016-09-15 NOTE — Assessment & Plan Note (Signed)
Hypertension longstanding diagnosed currently controlled.  Patient reports adherence with medication. Continue current medications.  Patient will double check the dosing of his lisinopril.

## 2016-09-15 NOTE — Assessment & Plan Note (Signed)
Diabetes longstanding diagnosed currently uncontrolled but CBGs have improved since last visit. Patient denies hypoglycemic events and is able to verbalize appropriate hypoglycemia management plan. Patient reports adherence with medication. Control is suboptimal due to noncompliance to diet. Discussed low carbohydrate diet and encouraged to continue to limit midnight snacks. Continued basal insulin Lantus (insulin glargine) 50 units twice daily. Continued dose of rapid insulin Novolog (insulin aspart) to 45 units twice daily before meals. Continued Victoza (liraglutide) 1.8 mg daily.  Next A1C anticipated 2 months.

## 2016-09-15 NOTE — Progress Notes (Signed)
    S:    Patient arrives in good spirits ambulating without assistance.  Presents for diabetes evaluation, education, and management at the request of PCP - Dr Kennon Rounds. Patient was last seen on 07/28/16 in pharmacy clinic  States he is still smoke free and using mints to assist with avoiding cigarrettes.  Reports shortness of breath with exertion every 3-4 days.   Patient reports adherence with medications.  Current diabetes medications include: Lantus 50 units BID, Novolog 45 units BID, metformin 1000 mg BID, Victoza 1.8 mg daily Current hypertension medications include: carvedilol 3.125 mg daily (prescribed twice daily), lisinopril 2.5 mg twice daily (tablet strength was changed from 5 mg to 2.5 mg)  Patient denies hypoglycemic events.  Patient reported dietary habits: has stopped midnight snacks with peanut butter sandwich and has been drinking glass of milk/water and occasional orange.   Patient reported exercise habits: every other day for 15 minutes walking    Patient reports nocturia 1x/night  Patient reports neuropathy occasionally. Patient reports visual changes with blurred vision.  Also reports he has trouble hearing but has hearing test next month.  Patient reports self foot exams.    O:  Lab Results  Component Value Date   HGBA1C 8.3 07/28/2016   Vitals:   09/15/16 1336  BP: 132/78   Highest CBG 175 and Lowest 83 mg/dL.  Average 132 mg/dL with SD of 37.7 (number of readings over past 2 weeks = 6)    A/P: Diabetes longstanding diagnosed currently uncontrolled but CBGs have improved since last visit. Patient denies hypoglycemic events and is able to verbalize appropriate hypoglycemia management plan. Patient reports adherence with medication. Control is suboptimal due to noncompliance to diet. Discussed low carbohydrate diet and encouraged to continue to limit midnight snacks. Continued basal insulin Lantus (insulin glargine) 50 units twice daily. Continued dose of rapid  insulin Novolog (insulin aspart) to 45 units twice daily before meals. Continued Victoza (liraglutide) 1.8 mg daily.  Next A1C anticipated 2 months.  ASCVD risk greater than 7.5%. Continued Aspirin 81 mg and Continued atorvastatin 80 mg daily.   Hypertension longstanding diagnosed currently controlled.  Patient reports adherence with medication. Continue current medications.  Patient will double check the dosing of his lisinopril.  Longstanding tobacco dependence now quit. Using 12-14 mints/gum per day. Encourage patient to continue to remain tobacco free  COPD with improved lung function since stopping smoking and occasional shortness of breath  Dulera 200/5 mcg previously reduced to 1 puff BID.  Patient provided with sample today and will continue current dosing.  Written patient instructions provided.  Total time in face to face counseling 30 minutes.   Follow up in Pharmacist Clinic Visit in April and Followup with Dr Kennon Rounds in March.   Patient seen with Bennye Alm, PharmD, BCPS and Mikael Spray, PharmD candidate.

## 2016-09-15 NOTE — Assessment & Plan Note (Signed)
  COPD with improved lung function since stopping smoking and occasional shortness of breath  Dulera 200/5 mcg previously reduced to 1 puff BID.  Patient provided with sample today and will continue current dosing.

## 2016-09-15 NOTE — Patient Instructions (Addendum)
Check the strength of your lisinopril tablets.  You should be taking 2.5 mg twice daily.   Continue current medications  Followup with Dr Kennon Rounds in March and Dr Valentina Lucks in April.

## 2016-09-15 NOTE — Assessment & Plan Note (Signed)
Longstanding tobacco dependence now quit. Using 12-14 mints/gum per day. Encourage patient to continue to remain tobacco free

## 2016-09-19 ENCOUNTER — Encounter (HOSPITAL_COMMUNITY): Payer: Self-pay | Admitting: Emergency Medicine

## 2016-09-19 ENCOUNTER — Emergency Department (HOSPITAL_COMMUNITY): Payer: Medicaid Other

## 2016-09-19 ENCOUNTER — Emergency Department (HOSPITAL_COMMUNITY)
Admission: EM | Admit: 2016-09-19 | Discharge: 2016-09-20 | Disposition: A | Discharge status: Home / Self Care | Payer: Medicaid Other | Attending: Emergency Medicine | Admitting: Emergency Medicine

## 2016-09-19 DIAGNOSIS — J449 Chronic obstructive pulmonary disease, unspecified: Secondary | ICD-10-CM | POA: Diagnosis not present

## 2016-09-19 DIAGNOSIS — Y999 Unspecified external cause status: Secondary | ICD-10-CM | POA: Diagnosis not present

## 2016-09-19 DIAGNOSIS — Z87891 Personal history of nicotine dependence: Secondary | ICD-10-CM | POA: Diagnosis not present

## 2016-09-19 DIAGNOSIS — Z7982 Long term (current) use of aspirin: Secondary | ICD-10-CM | POA: Diagnosis not present

## 2016-09-19 DIAGNOSIS — Z955 Presence of coronary angioplasty implant and graft: Secondary | ICD-10-CM | POA: Diagnosis not present

## 2016-09-19 DIAGNOSIS — Y929 Unspecified place or not applicable: Secondary | ICD-10-CM | POA: Insufficient documentation

## 2016-09-19 DIAGNOSIS — I252 Old myocardial infarction: Secondary | ICD-10-CM | POA: Insufficient documentation

## 2016-09-19 DIAGNOSIS — I10 Essential (primary) hypertension: Secondary | ICD-10-CM | POA: Diagnosis not present

## 2016-09-19 DIAGNOSIS — E119 Type 2 diabetes mellitus without complications: Secondary | ICD-10-CM | POA: Diagnosis not present

## 2016-09-19 DIAGNOSIS — I2510 Atherosclerotic heart disease of native coronary artery without angina pectoris: Secondary | ICD-10-CM | POA: Insufficient documentation

## 2016-09-19 DIAGNOSIS — Z794 Long term (current) use of insulin: Secondary | ICD-10-CM | POA: Diagnosis not present

## 2016-09-19 DIAGNOSIS — S022XXA Fracture of nasal bones, initial encounter for closed fracture: Secondary | ICD-10-CM | POA: Diagnosis not present

## 2016-09-19 DIAGNOSIS — F1010 Alcohol abuse, uncomplicated: Secondary | ICD-10-CM

## 2016-09-19 DIAGNOSIS — W010XXA Fall on same level from slipping, tripping and stumbling without subsequent striking against object, initial encounter: Secondary | ICD-10-CM | POA: Diagnosis not present

## 2016-09-19 DIAGNOSIS — S0993XA Unspecified injury of face, initial encounter: Secondary | ICD-10-CM | POA: Diagnosis present

## 2016-09-19 DIAGNOSIS — Y939 Activity, unspecified: Secondary | ICD-10-CM | POA: Diagnosis not present

## 2016-09-19 LAB — CBC WITH DIFFERENTIAL/PLATELET
BASOS PCT: 0 %
Basophils Absolute: 0 10*3/uL (ref 0.0–0.1)
EOS PCT: 2 %
Eosinophils Absolute: 0.2 10*3/uL (ref 0.0–0.7)
HCT: 34.1 % — ABNORMAL LOW (ref 39.0–52.0)
HEMOGLOBIN: 11.5 g/dL — AB (ref 13.0–17.0)
Lymphocytes Relative: 24 %
Lymphs Abs: 3.3 10*3/uL (ref 0.7–4.0)
MCH: 29.3 pg (ref 26.0–34.0)
MCHC: 33.7 g/dL (ref 30.0–36.0)
MCV: 87 fL (ref 78.0–100.0)
Monocytes Absolute: 0.6 10*3/uL (ref 0.1–1.0)
Monocytes Relative: 5 %
NEUTROS PCT: 69 %
Neutro Abs: 9.5 10*3/uL — ABNORMAL HIGH (ref 1.7–7.7)
PLATELETS: 261 10*3/uL (ref 150–400)
RBC: 3.92 MIL/uL — AB (ref 4.22–5.81)
RDW: 12.5 % (ref 11.5–15.5)
WBC: 13.6 10*3/uL — AB (ref 4.0–10.5)

## 2016-09-19 LAB — BASIC METABOLIC PANEL
Anion gap: 7 (ref 5–15)
BUN: 14 mg/dL (ref 6–20)
CALCIUM: 7.2 mg/dL — AB (ref 8.9–10.3)
CO2: 22 mmol/L (ref 22–32)
CREATININE: 0.98 mg/dL (ref 0.61–1.24)
Chloride: 107 mmol/L (ref 101–111)
GFR calc non Af Amer: >60 mL/min (ref >60)
Glucose, Bld: 136 mg/dL — ABNORMAL HIGH (ref 65–99)
Potassium: 3.6 mmol/L (ref 3.5–5.1)
SODIUM: 136 mmol/L (ref 135–145)

## 2016-09-19 LAB — ETHANOL: ALCOHOL ETHYL (B): 167 mg/dL — AB (ref <5)

## 2016-09-19 MED ORDER — SODIUM CHLORIDE 0.9 % IV BOLUS (SEPSIS)
1000.0000 mL | Freq: Once | INTRAVENOUS | Status: AC
  Administered 2016-09-19: 1000 mL via INTRAVENOUS

## 2016-09-19 MED ORDER — OXYMETAZOLINE HCL 0.05 % NA SOLN
1.0000 | Freq: Once | NASAL | Status: AC
  Administered 2016-09-19: 1 via NASAL
  Filled 2016-09-19: qty 15

## 2016-09-19 NOTE — ED Notes (Signed)
EDP aware of pt vital signs. Verbal order of bolus of NS given

## 2016-09-19 NOTE — ED Triage Notes (Addendum)
Pt from home states he tripped over his dog's leash and fell on his face on cement. Pt has bleeding from his nose. Pt also has complaints of low back pain. Pt denies other injury or LOC. Pt denies blood thinner use. Pt is hypotensive at time of time of assessment. Pt does endorse lightheadedness. Pt endorses use of ETOH

## 2016-09-20 ENCOUNTER — Other Ambulatory Visit: Payer: Self-pay

## 2016-09-20 ENCOUNTER — Emergency Department (HOSPITAL_COMMUNITY)
Admission: EM | Admit: 2016-09-20 | Discharge: 2016-09-20 | Discharge status: Absent without leave | Payer: Medicaid Other

## 2016-09-20 ENCOUNTER — Encounter (HOSPITAL_COMMUNITY): Payer: Self-pay | Admitting: Emergency Medicine

## 2016-09-20 ENCOUNTER — Emergency Department (HOSPITAL_COMMUNITY)
Admission: EM | Admit: 2016-09-20 | Discharge: 2016-09-20 | Disposition: A | Discharge status: Home / Self Care | Payer: Medicaid Other | Source: Home / Self Care | Attending: Emergency Medicine | Admitting: Emergency Medicine

## 2016-09-20 DIAGNOSIS — J449 Chronic obstructive pulmonary disease, unspecified: Secondary | ICD-10-CM | POA: Insufficient documentation

## 2016-09-20 DIAGNOSIS — E119 Type 2 diabetes mellitus without complications: Secondary | ICD-10-CM

## 2016-09-20 DIAGNOSIS — I2510 Atherosclerotic heart disease of native coronary artery without angina pectoris: Secondary | ICD-10-CM | POA: Insufficient documentation

## 2016-09-20 DIAGNOSIS — Z794 Long term (current) use of insulin: Secondary | ICD-10-CM

## 2016-09-20 DIAGNOSIS — I252 Old myocardial infarction: Secondary | ICD-10-CM

## 2016-09-20 DIAGNOSIS — Z79899 Other long term (current) drug therapy: Secondary | ICD-10-CM

## 2016-09-20 DIAGNOSIS — Z955 Presence of coronary angioplasty implant and graft: Secondary | ICD-10-CM

## 2016-09-20 DIAGNOSIS — R040 Epistaxis: Secondary | ICD-10-CM | POA: Insufficient documentation

## 2016-09-20 DIAGNOSIS — Z7982 Long term (current) use of aspirin: Secondary | ICD-10-CM

## 2016-09-20 DIAGNOSIS — I10 Essential (primary) hypertension: Secondary | ICD-10-CM | POA: Insufficient documentation

## 2016-09-20 DIAGNOSIS — Z79891 Long term (current) use of opiate analgesic: Secondary | ICD-10-CM

## 2016-09-20 MED ORDER — AMOXICILLIN-POT CLAVULANATE 875-125 MG PO TABS: 1.0000 | ORAL_TABLET | Freq: Two times a day (BID) | ORAL | 0 refills | Status: DC

## 2016-09-20 MED ORDER — AMOXICILLIN-POT CLAVULANATE 875-125 MG PO TABS
1.0000 | ORAL_TABLET | Freq: Once | ORAL | Status: AC
  Administered 2016-09-20: 1 via ORAL
  Filled 2016-09-20: qty 1

## 2016-09-20 NOTE — ED Provider Notes (Signed)
[facility] DEPT Provider Note   CSN: [id number] Arrival date & time: 09/19/16  2047     History   Chief Complaint Chief Complaint  Patient presents with  . Fall  . Facial Injury    HPI [name] [name] is a 56 y.o. male.  Patient is a 56 year old male with a history of EtOH abuse who presents after a fall. He states he tripped over a dog leash and fell onto his face. He complains of pain to his nose but denies any other injury. He denies any loss of consciousness. He denies any neck or back pain. He states the fall happened about 2 hours ago and he's been bleeding continued continuously from his nose since that time. He feels a little bit lightheaded. He does state he drank 8 beers today. He was noted to be hypotensive on arrival with ongoing bleeding from the left nares.      Past Medical History:  Diagnosis Date  . Alcoholism ([place])   . Anxiety   . Arthritis    "qwhere"  . Asthma   . CAD S/P percutaneous coronary angioplasty 01/18/12; 10/2013   a. pRCA 3.5 x 18 vision BMS - 4.2 mm; b. 2/'15: mCx 3.5 x 12  Rebel BMS (3.6-3.7 mm)  . Chronic back pain    "mid/lower" (08/01/2014)  . COPD (chronic obstructive pulmonary disease) ([place])    "I'm seeing COPD dr now; don't know if I've got it" (08/01/2014)  . Depression   . GERD (gastroesophageal reflux disease)   . Hyperlipidemia   . Hypertension   . Migraines    "once in awhile" (08/01/2014)  . Non-ST elevation myocardial infarction (NSTEMI) ([place]) 10/2013  . OSA on CPAP 01/19/2012  . Type II diabetes mellitus [facility])     Patient Active Problem List   Diagnosis Date Noted  . Depression, acute 02/17/2016  . Hearing difficulty 12/10/2014  . COPD, moderate ([place]) 11/22/2014  . Cough   . Angina, class II ([place]) 08/01/2014  . H/O NSTEMI (non-ST elevated myocardial infarction) 11/07/2013  . SOB (shortness of breath) on exertion 10/04/2013  . Pre-ulcerative corn or callous 10/12/2012  . Dental caries 10/12/2012  . Back pain 08/24/2012   . History of tobacco abuse 08/24/2012  . Obesity 07/31/2012  . DM type 2 (diabetes mellitus, type 2) ([place]) 06/05/2012  . CAD S/P percutaneous coronary angioplasty 01/21/2012  . OSA on CPAP 01/19/2012  . Essential hypertension 01/18/2012  . Hyperlipidemia with target LDL less than 70 01/18/2012    Past Surgical History:  Procedure Laterality Date  . CARDIAC CATHETERIZATION  07/2014   Left Main: Short, large-caliber vessel. Widely patent. Bifurcates into the LAD and Circumflex. Angiographically normal.  . CORONARY ANGIOPLASTY WITH STENT PLACEMENT  01/2012; 11/07/2013; 08/01/2014   "1 + 2 + 1"   . CORONARY ANGIOPLASTY WITH STENT PLACEMENT  07/2014   Severe single-vessel disease involving the bifurcation of OM1 and OM 2 with 99% in-stent restenosis in the bare-metal stent placed in the OM 2.  . LEFT HEART CATHETERIZATION WITH CORONARY ANGIOGRAM N/A 01/20/2012   Procedure: LEFT HEART CATHETERIZATION WITH CORONARY ANGIOGRAM;  Surgeon: Jettie Booze, MD;  Location: [facility] CATH LAB;  Service: Cardiovascular;  Laterality: N/A;  . LEFT HEART CATHETERIZATION WITH CORONARY ANGIOGRAM N/A 11/07/2013   Procedure: LEFT HEART CATHETERIZATION WITH CORONARY ANGIOGRAM;  Surgeon: Jettie Booze, MD;  Location: [facility] CATH LAB;  Service: Cardiovascular;  Laterality: N/A;  . LEFT HEART CATHETERIZATION WITH CORONARY ANGIOGRAM N/A 08/01/2014   Procedure: LEFT  HEART CATHETERIZATION WITH CORONARY ANGIOGRAM;  Surgeon: Leonie Man, MD;  Location: [facility] CATH LAB;  Service: Cardiovascular;  Laterality: N/A;  . PERCUTANEOUS CORONARY STENT INTERVENTION (PCI-S)  01/20/2012   Procedure: PERCUTANEOUS CORONARY STENT INTERVENTION (PCI-S);  Surgeon: Jettie Booze, MD;  Location: [facility] CATH LAB;  Service: Cardiovascular;;  . PERCUTANEOUS CORONARY STENT INTERVENTION (PCI-S)  11/07/2013   Procedure: PERCUTANEOUS CORONARY STENT INTERVENTION (PCI-S);  Surgeon: Jettie Booze, MD;  Location: [facility] CATH LAB;  Service:  Cardiovascular;;       Home Medications    Prior to Admission medications   Medication Sig Start Date End Date Taking? Authorizing Provider  aspirin 81 MG chewable tablet Chew 1 tablet (81 mg total) by mouth daily. 10/04/13  Yes [name], MD  atorvastatin (LIPITOR) 80 MG tablet Take 1 tablet (80 mg total) by mouth daily at 6 PM. 05/23/16  Yes [name], MD  carvedilol (COREG) 3.125 MG tablet Take 1 tablet (3.125 mg total) by mouth 2 (two) times daily with a meal. Patient taking differently: Take 3.125 mg by mouth daily.  05/23/16  Yes [name], MD  citalopram (CELEXA) 20 MG tablet Take 1 tablet (20 mg total) by mouth daily. 02/17/16  Yes [name], MD  insulin aspart (NOVOLOG) 100 UNIT/ML injection INJECT 45 UNITS INTO THE SKIN TWICE DAILY WITH MEALS 09/15/16  Yes Zenia Resides, MD  insulin glargine (LANTUS) 100 UNIT/ML injection INJECT 0.50 MILLILITERS (50 UNITS TOTAL) INTO THE SKIN TWICE A DAY 09/15/16  Yes Zenia Resides, MD  Liraglutide 18 MG/3ML SOPN Inject 0.3 mLs (1.8 mg total) into the skin daily. Dispense QS for 1 month supply 05/23/16  Yes [name], MD  lisinopril (PRINIVIL,ZESTRIL) 2.5 MG tablet Take 1 tablet (2.5 mg total) by mouth 2 (two) times daily. 08/23/16  Yes [name], MD  metFORMIN (GLUCOPHAGE) 1000 MG tablet Take 1 tablet (1,000 mg total) by mouth 2 (two) times daily with a meal. 07/19/16  Yes [name], MD  mometasone-formoterol (DULERA) 200-5 MCG/ACT AERO Inhale 1 puff into the lungs 2 (two) times daily. 09/15/16  Yes Zenia Resides, MD  Multiple Vitamin (MULTIVITAMIN) tablet Take 1 tablet by mouth daily.   Yes Historical Provider, MD  nitroGLYCERIN (NITROSTAT) 0.4 MG SL tablet Place 1 tablet (0.4 mg total) under the tongue every 5 (five) minutes x 3 doses as needed for chest pain. 05/30/16  Yes [name], MD  Omega-3 Fatty Acids (FISH OIL PO) Take 1,000 mg by mouth 2 (two) times daily.    Yes Historical Provider, MD  prasugrel (EFFIENT) 10  MG TABS tablet Take 1 tablet (10 mg total) by mouth daily. 09/14/16  Yes Peter M Martinique, MD  traMADol (ULTRAM) 50 MG tablet Take 1 tablet (50 mg total) by mouth every 6 (six) hours as needed. Patient taking differently: Take 50 mg by mouth every 6 (six) hours as needed for moderate pain.  05/23/16  Yes [name], MD  ACCU-CHEK FASTCLIX LANCETS MISC 1 Units by Percutaneous route 4 (four) times daily. 07/01/15   [name], MD  albuterol (PROVENTIL HFA;VENTOLIN HFA) 108 (90 BASE) MCG/ACT inhaler Inhale 1-2 puffs into the lungs every 6 (six) hours as needed for wheezing or shortness of breath. Patient not taking: Reported on 09/19/2016 11/22/14   Fransico Meadow, PA-C  amoxicillin-clavulanate (AUGMENTIN) 875-125 MG tablet Take 1 tablet by mouth 2 (two) times daily. One po bid x 7 days 09/20/16  Malvin Johns, MD  B-D INS SYR ULTRAFINE 1CC/31G 31G X 5/16" 1 ML MISC INJECT 5 TIMES DAILY AS DIRECTED. 07/18/16   [name], MD  Blood Glucose Monitoring Suppl (ACCU-CHEK AVIVA PLUS) w/Device KIT Check fasting, and two hours after breakfast, lunch and dinner. ICD-10 code E11.9. 08/31/16   [name], MD  Blood Glucose Monitoring Suppl (ACCU-CHEK NANO SMARTVIEW) w/Device KIT 1 Units by Does not apply route as directed. Check fasting, and two hours after breakfast, lunch and dinner. ICD-10 code E11.9. 06/17/16   [name], MD  glucose blood (ACCU-CHEK AVIVA) test strip Use test blood sugar 4 times a day. ICD-10 code: E11.9 08/09/16   [name], MD  Insulin Pen Needle (BD ULTRA-FINE PEN NEEDLES) 29G X 12.7MM MISC Use to with Victoza Pen, injection once daily. 07/22/15   [name], MD  Lancets (ACCU-CHEK SOFT TOUCH) lancets 1 each by Other route 4 (four) times daily. Use as instructed 09/09/16   [name], MD    Family History Family History  Problem Relation Age of Onset  . Heart attack Father 109  . Breast cancer Mother   . Hypertension Sister   . Hyperlipidemia Sister   . Hyperlipidemia  Sister   . Hypertension Sister   . Heart attack Brother   . Stroke Brother   . Diabetes Brother   . Diabetes Sister     Social History Social History  Substance Use Topics  . Smoking status: Former Smoker    Packs/day: 0.50    Years: 37.00    Types: Cigarettes    Quit date: 12/18/2014  . Smokeless tobacco: Never Used     Comment: pt reports "I quit 66 days ago" (05/09/16)  . Alcohol use 3.6 oz/week    6 Cans of beer per week     Comment: pt reports "I quit 66 days ago" (05/09/16)     Allergies   Shellfish allergy   Review of Systems Review of Systems  Constitutional: Negative for activity change, appetite change, chills, diaphoresis, fatigue and fever.  HENT: Positive for nosebleeds. Negative for congestion, dental problem, rhinorrhea, sneezing and trouble swallowing.   Eyes: Negative.  Negative for pain and visual disturbance.  Respiratory: Negative for cough, chest tightness and shortness of breath.   Cardiovascular: Negative for chest pain and leg swelling.  Gastrointestinal: Negative for abdominal pain, blood in stool, diarrhea, nausea and vomiting.  Genitourinary: Negative for difficulty urinating, dysuria, flank pain, frequency and hematuria.  Musculoskeletal: Negative for arthralgias, back pain, joint swelling and neck pain.  Skin: Positive for wound. Negative for rash.  Neurological: Negative for dizziness, speech difficulty, weakness, numbness and headaches.  Psychiatric/Behavioral: Negative for confusion.     Physical Exam Updated Vital Signs BP 113/57 (BP Location: Left Arm)   Pulse 80   Temp 98.7 F (37.1 C)   Resp 16   Ht _0  (1.702 m)   SpO2 98%   Physical Exam  Constitutional: He is oriented to person, place, and time. He appears well-developed and well-nourished.  HENT:  Head: Normocephalic and atraumatic.  Nose: Nose normal.  Patient has bleeding from the left nares.  There is swelling to the nasal bridge but no obvious deformity. Patient is  also spitting out blood.  Eyes: Conjunctivae are normal. Pupils are equal, round, and reactive to light.  Neck:  No pain to the cervical, thoracic, or LS spine.  No step-offs or deformities noted  Cardiovascular: Normal rate and regular rhythm.  No murmur heard. No evidence of external trauma to the chest or abdomen  Pulmonary/Chest: Effort normal and breath sounds normal. No respiratory distress. He has no wheezes. He exhibits no tenderness.  Abdominal: Soft. Bowel sounds are normal. He exhibits no distension. There is no tenderness.  Musculoskeletal: Normal range of motion.  No pain on palpation or ROM of the extremities  Neurological: He is alert and oriented to person, place, and time.  Skin: Skin is warm and dry. Capillary refill takes less than 2 seconds.  Psychiatric: He has a normal mood and affect.  Vitals reviewed.    ED Treatments / Results  Labs (all labs ordered are listed, but only abnormal results are displayed) Labs Reviewed  ETHANOL - Abnormal; Notable for the following:       Result Value   Alcohol, Ethyl (B) 167 (*)    All other components within normal limits  BASIC METABOLIC PANEL - Abnormal; Notable for the following:    Glucose, Bld 136 (*)    Calcium 7.2 (*)    All other components within normal limits  CBC WITH DIFFERENTIAL/PLATELET - Abnormal; Notable for the following:    WBC 13.6 (*)    RBC 3.92 (*)    Hemoglobin 11.5 (*)    HCT 34.1 (*)    Neutro Abs 9.5 (*)    All other components within normal limits    EKG  EKG Interpretation None       Radiology Ct Head Wo Contrast  Result Date: 09/19/2016 CLINICAL DATA:  Tripped and fell over dog leash, struck face on cement. No loss of consciousness. History of hypertension, hyperlipidemia, diabetes. EXAM: CT HEAD WITHOUT CONTRAST CT MAXILLOFACIAL WITHOUT CONTRAST TECHNIQUE: Multidetector CT imaging of the head and maxillofacial structures were performed using the standard protocol without  intravenous contrast. Multiplanar CT image reconstructions of the maxillofacial structures were also generated. COMPARISON:  CT HEAD December 13, 2014 FINDINGS: CT HEAD FINDINGS BRAIN: Stable mild ventriculomegaly on the basis of global parenchymal brain volume loss. No intraparenchymal hemorrhage, mass effect nor midline shift. 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 No acute large vascular territory infarcts. No abnormal extra-axial fluid collections. Basal cisterns are patent. VASCULAR: Mild calcific atherosclerosis of the carotid siphons. SKULL/SOFT TISSUES: No skull fracture. No significant soft tissue swelling. OTHER: None. CT MAXILLOFACIAL FINDINGS- moderately motion degraded examination. OSSEOUS: Acute mildly impacted bilateral nasal bone fractures, in alignment. Acute nondisplaced osseous nasal septum fracture. Nasal septum deviated to the RIGHT. Sparing of the nasal process of the maxilla. Mandible is intact, condyles are located. Poor dentition with multiple dental caries and periapical lucency/abscess of the residual teeth. ORBITS: Ocular globes and orbital contents are normal. SINUSES: Frothy secretions bilateral maxillary sinuses most compatible with blood products, layering secretions LEFT sphenoid sinus, frothy secretions ethmoid air cells. Bilateral middle ear and mastoid effusions. Pledget/tube LEFT nasal cavity. SOFT TISSUES: Midface soft tissue swelling and subcutaneous gas without radiopaque foreign bodies. Severe calcific atherosclerosis the bilateral carotid bulbs. IMPRESSION: CT HEAD: No acute intracranial process. Stable mild global parenchymal brain volume loss and old small RIGHT frontal lobe infarct. CT MAXILLOFACIAL: Moderately motion degraded examination. Acute nondisplaced bilateral nasal bone and osseous nasal septum fracture. Probable hemosinus. Chronic bilateral middle ear and mastoid effusions. Severe calcific atherosclerosis of the carotid bifurcations, recommend follow-up CTA neck on a  NONEMERGENT basis to assess for hemodynamically significant stenosis. Electronically Signed   By: Elon Alas M.D.   On: 09/19/2016 22:24   Ct Maxillofacial Wo Cm  Result  Date: 09/19/2016 CLINICAL DATA:  Tripped and fell over dog leash, struck face on cement. No loss of consciousness. History of hypertension, hyperlipidemia, diabetes. EXAM: CT HEAD WITHOUT CONTRAST CT MAXILLOFACIAL WITHOUT CONTRAST TECHNIQUE: Multidetector CT imaging of the head and maxillofacial structures were performed using the standard protocol without intravenous contrast. Multiplanar CT image reconstructions of the maxillofacial structures were also generated. COMPARISON:  CT HEAD December 13, 2014 FINDINGS: CT HEAD FINDINGS BRAIN: Stable mild ventriculomegaly on the basis of global parenchymal brain volume loss. No intraparenchymal hemorrhage, mass effect nor midline shift. 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 No acute large vascular territory infarcts. No abnormal extra-axial fluid collections. Basal cisterns are patent. VASCULAR: Mild calcific atherosclerosis of the carotid siphons. SKULL/SOFT TISSUES: No skull fracture. No significant soft tissue swelling. OTHER: None. CT MAXILLOFACIAL FINDINGS- moderately motion degraded examination. OSSEOUS: Acute mildly impacted bilateral nasal bone fractures, in alignment. Acute nondisplaced osseous nasal septum fracture. Nasal septum deviated to the RIGHT. Sparing of the nasal process of the maxilla. Mandible is intact, condyles are located. Poor dentition with multiple dental caries and periapical lucency/abscess of the residual teeth. ORBITS: Ocular globes and orbital contents are normal. SINUSES: Frothy secretions bilateral maxillary sinuses most compatible with blood products, layering secretions LEFT sphenoid sinus, frothy secretions ethmoid air cells. Bilateral middle ear and mastoid effusions. Pledget/tube LEFT nasal cavity. SOFT TISSUES: Midface soft tissue swelling and subcutaneous gas  without radiopaque foreign bodies. Severe calcific atherosclerosis the bilateral carotid bulbs. IMPRESSION: CT HEAD: No acute intracranial process. Stable mild global parenchymal brain volume loss and old small RIGHT frontal lobe infarct. CT MAXILLOFACIAL: Moderately motion degraded examination. Acute nondisplaced bilateral nasal bone and osseous nasal septum fracture. Probable hemosinus. Chronic bilateral middle ear and mastoid effusions. Severe calcific atherosclerosis of the carotid bifurcations, recommend follow-up CTA neck on a NONEMERGENT basis to assess for hemodynamically significant stenosis. Electronically Signed   By: Elon Alas M.D.   On: 09/19/2016 22:24    Procedures Procedures (including critical care time)  Medications Ordered in ED Medications  sodium chloride 0.9 % bolus 1,000 mL (1,000 mLs Intravenous New Bag/Given 09/19/16 2112)  oxymetazoline (AFRIN) 0.05 % nasal spray 1 spray (1 spray Each Nare Given 09/19/16 2122)  sodium chloride 0.9 % bolus 1,000 mL (1,000 mLs Intravenous New Bag/Given 09/19/16 2156)     Initial Impression / Assessment and Plan / ED Course  I have reviewed the triage vital signs and the nursing notes.  Pertinent labs & imaging results that were available during my care of the patient were reviewed by me and considered in my medical decision making (see chart for details).  Clinical Course     Patient presents after a fall. He was hypotensive on arrival with blood pressure systolically in the 42A and 70s. He was placed on a stretcher and IV fluids were started. His pressure improved with that. Afrin was sprayed in the nose which did not relieve the bleeding. Nasal packing was performed. He was monitored for a couple hours and had no ongoing bleeding. There is no bleeding in the posterior pharynx. His blood pressures remained stable. He's feeling much better. He's able to ambulate independently. He is clinically sober. His hemoglobin is stable. He was  discharged home in good condition. He was given a referral to follow-up with ENT. He was advised that the packing needs to come out in about 3 days. If he cannot follow-up with ENT in that time. He needs to have the packing removed either in the ED  or urgent care. He also started on antibiotics. He was advised return here if he has any worsening symptoms or ongoing nasal bleeding. He also was notified that he has carotid artery calcifications that need outpatient follow-up by his PCP.  Final Clinical Impressions(s) / ED Diagnoses   Final diagnoses:  Closed fracture of nasal bone, initial encounter  ETOH abuse    New Prescriptions New Prescriptions   AMOXICILLIN-CLAVULANATE (AUGMENTIN) 875-125 MG TABLET    Take 1 tablet by mouth 2 (two) times daily. One po bid x 7 days     Malvin Johns, MD 09/20/16 [phone]

## 2016-09-20 NOTE — ED Provider Notes (Signed)
[facility] DEPT Provider Note   CSN: [id number] Arrival date & time: 09/20/16  1015     History   Chief Complaint Chief Complaint  Patient presents with  . Epistaxis    HPI [name] [name] is a 56 y.o. male.  56 year old male presents for follow-up from epistaxis which is caused by nasal bone fracture. Patient seen yesterday and treated for this with a nasal packing as well as placed on antibiotics. States that he is did not get his antibiotics filled. Has some bleeding today that was not controlled with pressure. Had ENT follow-up given but has not called to schedule an appointment. Denies any drainage tobacco still. Does not have any bleeding at this time. Denies any new trauma.      Past Medical History:  Diagnosis Date  . Alcoholism ([place])   . Anxiety   . Arthritis    "qwhere"  . Asthma   . CAD S/P percutaneous coronary angioplasty 01/18/12; 10/2013   a. pRCA 3.5 x 18 vision BMS - 4.2 mm; b. 2/'15: mCx 3.5 x 12  Rebel BMS (3.6-3.7 mm)  . Chronic back pain    "mid/lower" (08/01/2014)  . COPD (chronic obstructive pulmonary disease) ([place])    "I'm seeing COPD dr now; don't know if I've got it" (08/01/2014)  . Depression   . GERD (gastroesophageal reflux disease)   . Hyperlipidemia   . Hypertension   . Migraines    "once in awhile" (08/01/2014)  . Non-ST elevation myocardial infarction (NSTEMI) ([place]) 10/2013  . OSA on CPAP 01/19/2012  . Type II diabetes mellitus [facility])     Patient Active Problem List   Diagnosis Date Noted  . Depression, acute 02/17/2016  . Hearing difficulty 12/10/2014  . COPD, moderate ([place]) 11/22/2014  . Cough   . Angina, class II ([place]) 08/01/2014  . H/O NSTEMI (non-ST elevated myocardial infarction) 11/07/2013  . SOB (shortness of breath) on exertion 10/04/2013  . Pre-ulcerative corn or callous 10/12/2012  . Dental caries 10/12/2012  . Back pain 08/24/2012  . History of tobacco abuse 08/24/2012  . Obesity 07/31/2012  . DM type 2 (diabetes  mellitus, type 2) ([place]) 06/05/2012  . CAD S/P percutaneous coronary angioplasty 01/21/2012  . OSA on CPAP 01/19/2012  . Essential hypertension 01/18/2012  . Hyperlipidemia with target LDL less than 70 01/18/2012    Past Surgical History:  Procedure Laterality Date  . CARDIAC CATHETERIZATION  07/2014   Left Main: Short, large-caliber vessel. Widely patent. Bifurcates into the LAD and Circumflex. Angiographically normal.  . CORONARY ANGIOPLASTY WITH STENT PLACEMENT  01/2012; 11/07/2013; 08/01/2014   "1 + 2 + 1"   . CORONARY ANGIOPLASTY WITH STENT PLACEMENT  07/2014   Severe single-vessel disease involving the bifurcation of OM1 and OM 2 with 99% in-stent restenosis in the bare-metal stent placed in the OM 2.  . LEFT HEART CATHETERIZATION WITH CORONARY ANGIOGRAM N/A 01/20/2012   Procedure: LEFT HEART CATHETERIZATION WITH CORONARY ANGIOGRAM;  Surgeon: Jettie Booze, MD;  Location: [facility] CATH LAB;  Service: Cardiovascular;  Laterality: N/A;  . LEFT HEART CATHETERIZATION WITH CORONARY ANGIOGRAM N/A 11/07/2013   Procedure: LEFT HEART CATHETERIZATION WITH CORONARY ANGIOGRAM;  Surgeon: Jettie Booze, MD;  Location: [facility] CATH LAB;  Service: Cardiovascular;  Laterality: N/A;  . LEFT HEART CATHETERIZATION WITH CORONARY ANGIOGRAM N/A 08/01/2014   Procedure: LEFT HEART CATHETERIZATION WITH CORONARY ANGIOGRAM;  Surgeon: Leonie Man, MD;  Location[facility] CATH LAB;  Service: Cardiovascular;  Laterality: N/A;  . PERCUTANEOUS CORONARY STENT INTERVENTION (  PCI-S)  01/20/2012   Procedure: PERCUTANEOUS CORONARY STENT INTERVENTION (PCI-S);  Surgeon: Jettie Booze, MD;  Location: [facility] CATH LAB;  Service: Cardiovascular;;  . PERCUTANEOUS CORONARY STENT INTERVENTION (PCI-S)  11/07/2013   Procedure: PERCUTANEOUS CORONARY STENT INTERVENTION (PCI-S);  Surgeon: Jettie Booze, MD;  Location: [facility] CATH LAB;  Service: Cardiovascular;;       Home Medications    Prior to Admission medications   Medication Sig  Start Date End Date Taking? Authorizing Provider  ACCU-CHEK FASTCLIX LANCETS MISC 1 Units by Percutaneous route 4 (four) times daily. 07/01/15  Yes [name], MD  aspirin 81 MG chewable tablet Chew 1 tablet (81 mg total) by mouth daily. 10/04/13  Yes [name], MD  atorvastatin (LIPITOR) 80 MG tablet Take 1 tablet (80 mg total) by mouth daily at 6 PM. Patient taking differently: Take 80 mg by mouth every morning.  05/23/16  Yes [name], MD  B-D INS SYR ULTRAFINE 1CC/31G 31G X 5/16" 1 ML MISC INJECT 5 TIMES DAILY AS DIRECTED. 07/18/16  Yes [name], MD  Blood Glucose Monitoring Suppl (ACCU-CHEK AVIVA PLUS) w/Device KIT Check fasting, and two hours after breakfast, lunch and dinner. ICD-10 code E11.9. 08/31/16  Yes [name], MD  Blood Glucose Monitoring Suppl (ACCU-CHEK NANO SMARTVIEW) w/Device KIT 1 Units by Does not apply route as directed. Check fasting, and two hours after breakfast, lunch and dinner. ICD-10 code E11.9. 06/17/16  Yes [name], MD  carvedilol (COREG) 3.125 MG tablet Take 1 tablet (3.125 mg total) by mouth 2 (two) times daily with a meal. Patient taking differently: Take 3.125 mg by mouth daily.  05/23/16  Yes [name], MD  citalopram (CELEXA) 20 MG tablet Take 1 tablet (20 mg total) by mouth daily. 02/17/16  Yes [name], MD  glucose blood (ACCU-CHEK AVIVA) test strip Use test blood sugar 4 times a day. ICD-10 code: E11.9 08/09/16  Yes [name], MD  insulin aspart (NOVOLOG) 100 UNIT/ML injection INJECT 45 UNITS INTO THE SKIN TWICE DAILY WITH MEALS 09/15/16  Yes Zenia Resides, MD  insulin glargine (LANTUS) 100 UNIT/ML injection INJECT 0.50 MILLILITERS (50 UNITS TOTAL) INTO THE SKIN TWICE A DAY 09/15/16  Yes Zenia Resides, MD  Insulin Pen Needle (BD ULTRA-FINE PEN NEEDLES) 29G X 12.7MM MISC Use to with Victoza Pen, injection once daily. 07/22/15  Yes [name], MD  Lancets (ACCU-CHEK SOFT TOUCH) lancets 1 each by Other route 4 (four) times  daily. Use as instructed 09/09/16  Yes [name], MD  Liraglutide 18 MG/3ML SOPN Inject 0.3 mLs (1.8 mg total) into the skin daily. Dispense QS for 1 month supply 05/23/16  Yes [name], MD  lisinopril (PRINIVIL,ZESTRIL) 2.5 MG tablet Take 1 tablet (2.5 mg total) by mouth 2 (two) times daily. 08/23/16  Yes [name], MD  metFORMIN (GLUCOPHAGE) 1000 MG tablet Take 1 tablet (1,000 mg total) by mouth 2 (two) times daily with a meal. 07/19/16  Yes [name], MD  mometasone-formoterol (DULERA) 200-5 MCG/ACT AERO Inhale 1 puff into the lungs 2 (two) times daily. 09/15/16  Yes Zenia Resides, MD  Multiple Vitamin (MULTIVITAMIN) tablet Take 1 tablet by mouth daily.   Yes Historical Provider, MD  nitroGLYCERIN (NITROSTAT) 0.4 MG SL tablet Place 1 tablet (0.4 mg total) under the tongue every 5 (five) minutes x 3 doses as needed for chest pain. 05/30/16  Yes [name], MD  Omega-3 Fatty Acids (FISH OIL PO) Take 1,000 mg by mouth 2 (two) times daily.    Yes Historical Provider, MD  prasugrel (EFFIENT) 10 MG TABS tablet Take 1 tablet (10 mg total) by mouth daily. 09/14/16  Yes Peter M Martinique, MD  traMADol (ULTRAM) 50 MG tablet Take 1 tablet (50 mg total) by mouth every 6 (six) hours as needed. Patient taking differently: Take 50 mg by mouth every 6 (six) hours as needed for moderate pain.  05/23/16  Yes [name], MD  amoxicillin-clavulanate (AUGMENTIN) 875-125 MG tablet Take 1 tablet by mouth 2 (two) times daily. One po bid x 7 days 09/20/16   Malvin Johns, MD    Family History Family History  Problem Relation Age of Onset  . Heart attack Father 3  . Breast cancer Mother   . Hypertension Sister   . Hyperlipidemia Sister   . Hyperlipidemia Sister   . Hypertension Sister   . Heart attack Brother   . Stroke Brother   . Diabetes Brother   . Diabetes Sister     Social History Social History  Substance Use Topics  . Smoking status: Former Smoker    Packs/day: 0.50    Years: 37.00      Types: Cigarettes    Quit date: 12/18/2014  . Smokeless tobacco: Never Used     Comment: pt reports "I quit 66 days ago" (05/09/16)  . Alcohol use 3.6 oz/week    6 Cans of beer per week     Comment: pt reports "I quit 66 days ago" (05/09/16)     Allergies   Shellfish allergy   Review of Systems Review of Systems  All other systems reviewed and are negative.    Physical Exam Updated Vital Signs BP 118/70 (BP Location: Right Arm)   Pulse 96   Temp 99.2 F (37.3 C) (Oral)   Resp 18   SpO2 94%   Physical Exam  Constitutional: He is oriented to person, place, and time. He appears well-developed and well-nourished.  Non-toxic appearance. No distress.  HENT:  Head: Normocephalic and atraumatic.  Nose:    No blood noted in the patient's posterior pharynx  Eyes: Conjunctivae, EOM and lids are normal. Pupils are equal, round, and reactive to light.  Neck: Normal range of motion. Neck supple. No tracheal deviation present. No thyroid mass present.  Cardiovascular: Normal rate, regular rhythm and normal heart sounds.  Exam reveals no gallop.   No murmur heard. Pulmonary/Chest: Effort normal and breath sounds normal. No stridor. No respiratory distress. He has no decreased breath sounds. He has no wheezes. He has no rhonchi. He has no rales.  Abdominal: Soft. Normal appearance and bowel sounds are normal. He exhibits no distension. There is no tenderness. There is no rebound and no CVA tenderness.  Musculoskeletal: Normal range of motion. He exhibits no edema or tenderness.  Neurological: He is alert and oriented to person, place, and time. He has normal strength. No cranial nerve deficit or sensory deficit. GCS eye subscore is 4. GCS verbal subscore is 5. GCS motor subscore is 6.  Skin: Skin is warm and dry. No abrasion and no rash noted.  Psychiatric: He has a normal mood and affect. His speech is normal and behavior is normal.  Nursing note and vitals reviewed.    ED  Treatments / Results  Labs (all labs ordered are listed, but only abnormal results are displayed) Labs Reviewed - No data to display  EKG  EKG Interpretation  None       Radiology Ct Head Wo Contrast  Result Date: 09/19/2016 CLINICAL DATA:  Tripped and fell over dog leash, struck face on cement. No loss of consciousness. History of hypertension, hyperlipidemia, diabetes. EXAM: CT HEAD WITHOUT CONTRAST CT MAXILLOFACIAL WITHOUT CONTRAST TECHNIQUE: Multidetector CT imaging of the head and maxillofacial structures were performed using the standard protocol without intravenous contrast. Multiplanar CT image reconstructions of the maxillofacial structures were also generated. COMPARISON:  CT HEAD December 13, 2014 FINDINGS: CT HEAD FINDINGS BRAIN: Stable mild ventriculomegaly on the basis of global parenchymal brain volume loss. No intraparenchymal hemorrhage, mass effect nor midline shift. 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 No acute large vascular territory infarcts. No abnormal extra-axial fluid collections. Basal cisterns are patent. VASCULAR: Mild calcific atherosclerosis of the carotid siphons. SKULL/SOFT TISSUES: No skull fracture. No significant soft tissue swelling. OTHER: None. CT MAXILLOFACIAL FINDINGS- moderately motion degraded examination. OSSEOUS: Acute mildly impacted bilateral nasal bone fractures, in alignment. Acute nondisplaced osseous nasal septum fracture. Nasal septum deviated to the RIGHT. Sparing of the nasal process of the maxilla. Mandible is intact, condyles are located. Poor dentition with multiple dental caries and periapical lucency/abscess of the residual teeth. ORBITS: Ocular globes and orbital contents are normal. SINUSES: Frothy secretions bilateral maxillary sinuses most compatible with blood products, layering secretions LEFT sphenoid sinus, frothy secretions ethmoid air cells. Bilateral middle ear and mastoid effusions. Pledget/tube LEFT nasal cavity. SOFT TISSUES: Midface  soft tissue swelling and subcutaneous gas without radiopaque foreign bodies. Severe calcific atherosclerosis the bilateral carotid bulbs. IMPRESSION: CT HEAD: No acute intracranial process. Stable mild global parenchymal brain volume loss and old small RIGHT frontal lobe infarct. CT MAXILLOFACIAL: Moderately motion degraded examination. Acute nondisplaced bilateral nasal bone and osseous nasal septum fracture. Probable hemosinus. Chronic bilateral middle ear and mastoid effusions. Severe calcific atherosclerosis of the carotid bifurcations, recommend follow-up CTA neck on a NONEMERGENT basis to assess for hemodynamically significant stenosis. Electronically Signed   By: Elon Alas M.D.   On: 09/19/2016 22:24   Ct Maxillofacial Wo Cm  Result Date: 09/19/2016 CLINICAL DATA:  Tripped and fell over dog leash, struck face on cement. No loss of consciousness. History of hypertension, hyperlipidemia, diabetes. EXAM: CT HEAD WITHOUT CONTRAST CT MAXILLOFACIAL WITHOUT CONTRAST TECHNIQUE: Multidetector CT imaging of the head and maxillofacial structures were performed using the standard protocol without intravenous contrast. Multiplanar CT image reconstructions of the maxillofacial structures were also generated. COMPARISON:  CT HEAD December 13, 2014 FINDINGS: CT HEAD FINDINGS BRAIN: Stable mild ventriculomegaly on the basis of global parenchymal brain volume loss. No intraparenchymal hemorrhage, mass effect nor midline shift. 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 No acute large vascular territory infarcts. No abnormal extra-axial fluid collections. Basal cisterns are patent. VASCULAR: Mild calcific atherosclerosis of the carotid siphons. SKULL/SOFT TISSUES: No skull fracture. No significant soft tissue swelling. OTHER: None. CT MAXILLOFACIAL FINDINGS- moderately motion degraded examination. OSSEOUS: Acute mildly impacted bilateral nasal bone fractures, in alignment. Acute nondisplaced osseous nasal septum fracture.  Nasal septum deviated to the RIGHT. Sparing of the nasal process of the maxilla. Mandible is intact, condyles are located. Poor dentition with multiple dental caries and periapical lucency/abscess of the residual teeth. ORBITS: Ocular globes and orbital contents are normal. SINUSES: Frothy secretions bilateral maxillary sinuses most compatible with blood products, layering secretions LEFT sphenoid sinus, frothy secretions ethmoid air cells. Bilateral middle ear and mastoid effusions. Pledget/tube LEFT nasal cavity. SOFT TISSUES: Midface soft tissue swelling and subcutaneous gas without radiopaque foreign bodies.  Severe calcific atherosclerosis the bilateral carotid bulbs. IMPRESSION: CT HEAD: No acute intracranial process. Stable mild global parenchymal brain volume loss and old small RIGHT frontal lobe infarct. CT MAXILLOFACIAL: Moderately motion degraded examination. Acute nondisplaced bilateral nasal bone and osseous nasal septum fracture. Probable hemosinus. Chronic bilateral middle ear and mastoid effusions. Severe calcific atherosclerosis of the carotid bifurcations, recommend follow-up CTA neck on a NONEMERGENT basis to assess for hemodynamically significant stenosis. Electronically Signed   By: Elon Alas M.D.   On: 09/19/2016 22:24    Procedures Procedures (including critical care time)  Medications Ordered in ED Medications  amoxicillin-clavulanate (AUGMENTIN) 875-125 MG per tablet 1 tablet (not administered)     Initial Impression / Assessment and Plan / ED Course  I have reviewed the triage vital signs and the nursing notes.  Pertinent labs & imaging results that were available during my care of the patient were reviewed by me and considered in my medical decision making (see chart for details).  Clinical Course     Patient has no active bleeding currently. Will be given 1 dose of Augmentin here. He will call to ENTs office today to schedule follow-up and return precautions  given.  Final Clinical Impressions(s) / ED Diagnoses   Final diagnoses:  None    New Prescriptions New Prescriptions   No medications on file     Lacretia Leigh, MD 09/20/16 1241

## 2016-09-20 NOTE — ED Notes (Signed)
Pt returned to triage reporting his nose has begun to bleed again. EDP informed

## 2016-09-20 NOTE — Telephone Encounter (Signed)
Order has been cancelled. Over one year old order

## 2016-09-20 NOTE — ED Triage Notes (Signed)
Pt reports he has ongoing bleeding since yesterday. Was here yesterday for same and had rhino rocket placed. Slow bleeding at present.

## 2016-09-20 NOTE — ED Notes (Signed)
Pt ambulated without problem. 

## 2016-09-20 NOTE — ED Notes (Signed)
Patient given diet coke, ham sandwich and bus pass. Patient on phone calling ENT to make follow up apt.

## 2016-09-20 NOTE — ED Triage Notes (Signed)
Pt returned to triage after discharge. Nose began bleeding again. EDP informed

## 2016-09-20 NOTE — ED Notes (Addendum)
ENT came and saw pt. Pt good to go home again. Pt still has discharge papers from earlier in visit.

## 2016-09-20 NOTE — ED Notes (Signed)
Gave patient warm wet wash clothes and towels to clean dried blood off face and chest.

## 2016-09-20 NOTE — Discharge Instructions (Addendum)
YOU NEED TO HAVE THE PACKING REMOVED IN 3 DAYS.  IF YOU CANNOT BE SEEN BY ENT IN THAT TIME, YOU NEED TO RETURN TO THE EMERGENCY DEPARTMENT OR URGENT CARE FOR REMOVAL.  RETURN TO THE emergency department for ongoing bleeding from your nose.  YOUR CAROTID ARTERIES HAVE CALCIFICATIONS AND NEED FURTHER EVALUATION BY YOUR PRIMARY CARE PROVIDER.

## 2016-09-20 NOTE — Discharge Instructions (Signed)
Apply ice pack across her nose for 15 minutes if the bleeding recurs. If that doesn't work, return here. Call Dr. Redmond Baseman today to schedule a follow-up visit

## 2016-09-23 ENCOUNTER — Telehealth: Payer: Self-pay | Admitting: Family Medicine

## 2016-09-23 NOTE — Telephone Encounter (Signed)
I had an issue understanding what the pt needed. Pt broke his nose on Monday. Going to ENT next week. Pt mentioned something about an Antibiotic amoxicillin-clavulanate and nasal spray. He would like someone to call him and stated it is ok to leave a voice mail. Please advise. ep

## 2016-09-23 NOTE — Telephone Encounter (Signed)
Pt called back and I was able to clarify. Pt needs to know what medications he can take and which ones he shouldn't since he broke his nose. Please call pt before end of day. Thank you! ep

## 2016-09-27 ENCOUNTER — Other Ambulatory Visit: Payer: Self-pay | Admitting: *Deleted

## 2016-09-27 DIAGNOSIS — I214 Non-ST elevation (NSTEMI) myocardial infarction: Secondary | ICD-10-CM

## 2016-09-27 MED ORDER — NITROGLYCERIN 0.4 MG SL SUBL: 0.4000 mg | SUBLINGUAL_TABLET | SUBLINGUAL | 3 refills | Status: DC | PRN

## 2016-09-30 NOTE — Telephone Encounter (Signed)
LMOVM for pt to return call.  Please relay message to pt when he calls. Kyvon Hu, Salome Spotted, CMA

## 2016-10-05 ENCOUNTER — Telehealth: Payer: Self-pay | Admitting: Cardiology

## 2016-10-05 NOTE — Telephone Encounter (Signed)
No samples of effient available. LM on name-verified VM w/this info and that Rx was sent to pharmacy on Jan 3

## 2016-10-05 NOTE — Telephone Encounter (Signed)
Patient calling the office for samples of medication:   1.  What medication and dosage are you requesting samples for? Effient   2.  Are you currently out of this medication? Yes

## 2016-10-14 ENCOUNTER — Other Ambulatory Visit: Payer: Self-pay | Admitting: Family Medicine

## 2016-10-14 DIAGNOSIS — I2510 Atherosclerotic heart disease of native coronary artery without angina pectoris: Secondary | ICD-10-CM

## 2016-10-14 DIAGNOSIS — E785 Hyperlipidemia, unspecified: Secondary | ICD-10-CM

## 2016-10-14 DIAGNOSIS — Z9861 Coronary angioplasty status: Principal | ICD-10-CM

## 2016-10-16 ENCOUNTER — Other Ambulatory Visit: Payer: Self-pay | Admitting: Cardiology

## 2016-10-16 DIAGNOSIS — J449 Chronic obstructive pulmonary disease, unspecified: Secondary | ICD-10-CM

## 2016-10-19 ENCOUNTER — Ambulatory Visit (INDEPENDENT_AMBULATORY_CARE_PROVIDER_SITE_OTHER): Payer: Medicaid Other | Admitting: Podiatry

## 2016-10-19 ENCOUNTER — Other Ambulatory Visit: Payer: Self-pay | Admitting: Cardiology

## 2016-10-19 ENCOUNTER — Encounter: Payer: Self-pay | Admitting: Podiatry

## 2016-10-19 VITALS — BP 141/60 | HR 80 | Resp 18

## 2016-10-19 DIAGNOSIS — B351 Tinea unguium: Secondary | ICD-10-CM

## 2016-10-19 DIAGNOSIS — M79676 Pain in unspecified toe(s): Secondary | ICD-10-CM

## 2016-10-19 DIAGNOSIS — E119 Type 2 diabetes mellitus without complications: Secondary | ICD-10-CM

## 2016-10-19 DIAGNOSIS — J449 Chronic obstructive pulmonary disease, unspecified: Secondary | ICD-10-CM

## 2016-10-19 NOTE — Patient Instructions (Signed)

## 2016-10-19 NOTE — Progress Notes (Signed)
   Subjective:    Patient ID: [name] [name], male    DOB: [birth year], 56 y.o.   MRN: [id number]  HPI   I am here for my yearly diabetic exam and I need to have my toenails trimmed    Review of Systems  All other systems reviewed and are negative.      Objective:   Physical Exam        Assessment & Plan:

## 2016-10-19 NOTE — Progress Notes (Signed)
Patient ID: [name] [name], male   DOB: [birth year], 56 y.o.   MRN: [id number]   Subjective: Patient presents for diabetic yearly foot examination as well as complaining of uncomfortable toenails and requests nail debridement. Patient describes occasional tingling and burning his feet on and off weightbearing is not significantly changed over the past year Patient is diabetic and denies any history of foot ulceration, claudication or amputation  Objective: Orientated 3 DP and PT pulses 2/4 bilaterally Capillary reflex immediate bilaterally  Neurological: Sensation to 10 g monofilament wire intact 5/5 bilaterally Ankle reflex reactive bilaterally Vibratory sensation reactive bilaterally  Dermatological: No open skin lesions bilaterally The toenails are elongatedl discolored, hypertrophic with tenderness to direct palpation  Musculoskeletal: There is no restriction ankle, subtalar, midtarsal joints bilaterally Manual motor testing dorsi flexion, plantar flexion 5/5 bilaterally  Assessment: Diabetic Satisfactory neurovascular status Symptomatic onychomycoses 6-10  Plan: Debridement toenails 6-10 mechanically and electronically without any bleeding  Patient requesting revisit at three-month intervals, reappoint 3 months

## 2016-10-20 ENCOUNTER — Other Ambulatory Visit: Payer: Self-pay | Admitting: *Deleted

## 2016-10-20 DIAGNOSIS — J449 Chronic obstructive pulmonary disease, unspecified: Secondary | ICD-10-CM

## 2016-10-20 MED ORDER — MOMETASONE FURO-FORMOTEROL FUM 200-5 MCG/ACT IN AERO: 1.0000 | INHALATION_SPRAY | Freq: Two times a day (BID) | RESPIRATORY_TRACT | 0 refills | Status: DC

## 2016-10-26 ENCOUNTER — Other Ambulatory Visit: Payer: Self-pay | Admitting: Cardiology

## 2016-10-26 DIAGNOSIS — J449 Chronic obstructive pulmonary disease, unspecified: Secondary | ICD-10-CM

## 2016-10-27 NOTE — Telephone Encounter (Signed)
Dulera refill refused. Last refilled by PCP

## 2016-10-28 ENCOUNTER — Other Ambulatory Visit: Payer: Self-pay | Admitting: Cardiology

## 2016-10-28 DIAGNOSIS — J449 Chronic obstructive pulmonary disease, unspecified: Secondary | ICD-10-CM

## 2016-10-31 ENCOUNTER — Other Ambulatory Visit: Payer: Self-pay | Admitting: *Deleted

## 2016-10-31 DIAGNOSIS — J449 Chronic obstructive pulmonary disease, unspecified: Secondary | ICD-10-CM

## 2016-11-01 ENCOUNTER — Telehealth: Payer: Self-pay | Admitting: Family Medicine

## 2016-11-01 ENCOUNTER — Other Ambulatory Visit: Payer: Self-pay | Admitting: Cardiology

## 2016-11-01 DIAGNOSIS — J449 Chronic obstructive pulmonary disease, unspecified: Secondary | ICD-10-CM

## 2016-11-01 MED ORDER — MOMETASONE FURO-FORMOTEROL FUM 200-5 MCG/ACT IN AERO: 1.0000 | INHALATION_SPRAY | Freq: Two times a day (BID) | RESPIRATORY_TRACT | 0 refills | Status: DC

## 2016-11-01 NOTE — Telephone Encounter (Signed)
Patient would like for a refill on his [facility] sent to his pharmacy/ls

## 2016-11-01 NOTE — Telephone Encounter (Signed)
Medication sent to pharmacy ok per Dr. Kennon Rounds. Essie Lagunes,CMA

## 2016-11-01 NOTE — Telephone Encounter (Signed)
Patient called back again to get status on his Dulera.  He needs it today.  Can you please make sure his script goes in today?  Thank you/ls

## 2016-11-01 NOTE — Telephone Encounter (Signed)
Patient is aware that script was sent to the pharmacy. Jazmin Hartsell,CMA

## 2016-11-01 NOTE — Telephone Encounter (Signed)
Medication resent to pharmacy changed from sample to normal status. Suzanna Zahn,CMA

## 2016-11-01 NOTE — Telephone Encounter (Signed)
dulera refill deferred to PCP

## 2016-11-01 NOTE — Addendum Note (Signed)
Addended by: Valerie Roys on: 11/01/2016 01:54 PM   Modules accepted: Orders

## 2016-11-02 ENCOUNTER — Ambulatory Visit: Payer: Medicaid Other | Attending: Audiology | Admitting: Audiology

## 2016-11-02 DIAGNOSIS — R94128 Abnormal results of other function studies of ear and other special senses: Secondary | ICD-10-CM | POA: Insufficient documentation

## 2016-11-02 DIAGNOSIS — H748X3 Other specified disorders of middle ear and mastoid, bilateral: Secondary | ICD-10-CM

## 2016-11-02 DIAGNOSIS — H900 Conductive hearing loss, bilateral: Secondary | ICD-10-CM | POA: Diagnosis present

## 2016-11-02 DIAGNOSIS — Z01118 Encounter for examination of ears and hearing with other abnormal findings: Secondary | ICD-10-CM | POA: Diagnosis present

## 2016-11-02 DIAGNOSIS — Z8669 Personal history of other diseases of the nervous system and sense organs: Secondary | ICD-10-CM | POA: Insufficient documentation

## 2016-11-02 DIAGNOSIS — Z9622 Myringotomy tube(s) status: Secondary | ICD-10-CM

## 2016-11-02 MED ORDER — MOMETASONE FURO-FORMOTEROL FUM 200-5 MCG/ACT IN AERO: INHALATION_SPRAY | RESPIRATORY_TRACT | 0 refills | Status: DC

## 2016-11-02 NOTE — Addendum Note (Signed)
Addended by: Derl Barrow on: 11/02/2016 10:41 AM   Modules accepted: Orders

## 2016-11-02 NOTE — Telephone Encounter (Signed)
Prior Authorization received from [employer] for [place]. PA approved via [employer] until 11/02/2017. Patient and Pharmacy is aware.  Derl Barrow, RN

## 2016-11-02 NOTE — Telephone Encounter (Signed)
Pt states [name] [name] needs prior authorization. Pt is out of this medication. Pt would like to get this as soon as possible. ep

## 2016-11-02 NOTE — Procedures (Signed)
[place], [place]  [id number] [address] EVALUATION  Patient Name: [name] [name]  Medical Record Number:  [id number] Date of Birth:  [birth year] Referent: [name], MD      Date of Test:  11/02/2016  HISTORY:   [name] [name] was seen for an audiological evaluation.  He was previously seen here on 07/10/2015 with "Moderate to severe, mostly conductive hearing loss bilaterally with a slight neural component noted at 500Hz  and 2000Hz .  Speech recognition is impaired at an elevated conversational level.  Tympanometry is suggestive of bilateral middle ear pathology."  [name] [name] states that after the last evaluation here that he was seen by an ENT and had "tubes".  He states that he heard "well after the tubes" but that he hearing has been getting worse again recently.    REPORT OF PAIN:  None  OTOSCOPIC EXAM:  No redness noted bilaterally. Blue "tube" observed.  EVALUATION:    Hearing thresholds 45-50 dBHL at 250-500Hz  and 30-40 dBHL from 1000Hz  - 8000Hz  bilaterally. Unmasked bone conduction shows a conductive hearing loss. Speech reception thresholds were 30 dBHL in each ear using spondee words.  Good test reliability.  Word recognition testing as 76 % at 70 DBHL on the right side and 94% at 65 dBHL on the left ear using recorded PBK word lists. Tympanometry shows abnormal middle ear function (Type B) - However the volume is within normal limits bilaterally which is consistent with occluded "tubes".   CONCLUSION:   [name] [name] needs follow-up with the ENT because the "tubes" appear occluded and there is abnormal middle ear function bilaterally. [name] [name] states that he will call the ENT for a follow-up appointment.  Hearing thresholds continue to show a moderate low frequency hearing loss with a mild hearing loss throughout the rest of the speech range.  The hearing loss appears conductive.   Please note word recognition appears excellent on the left side at conversational speech levels. However on the right side, even at comfortably loud levels, [name] [name] reported only fair word recognition.     RECOMMENDATIONS:    1. F/U with ENT to check tube patency and placement.   [name] [name] L. Heide Spark, Au.D., CCC-A Doctor of Audiology 11/02/2016

## 2016-11-07 ENCOUNTER — Emergency Department (HOSPITAL_COMMUNITY): Payer: Medicaid Other

## 2016-11-07 ENCOUNTER — Emergency Department (HOSPITAL_COMMUNITY)
Admission: EM | Admit: 2016-11-07 | Discharge: 2016-11-07 | Disposition: A | Discharge status: Home / Self Care | Payer: Medicaid Other | Attending: Emergency Medicine | Admitting: Emergency Medicine

## 2016-11-07 ENCOUNTER — Encounter (HOSPITAL_COMMUNITY): Payer: Self-pay | Admitting: Emergency Medicine

## 2016-11-07 DIAGNOSIS — Z87891 Personal history of nicotine dependence: Secondary | ICD-10-CM | POA: Diagnosis not present

## 2016-11-07 DIAGNOSIS — K0889 Other specified disorders of teeth and supporting structures: Secondary | ICD-10-CM | POA: Insufficient documentation

## 2016-11-07 DIAGNOSIS — J449 Chronic obstructive pulmonary disease, unspecified: Secondary | ICD-10-CM | POA: Diagnosis not present

## 2016-11-07 DIAGNOSIS — Z794 Long term (current) use of insulin: Secondary | ICD-10-CM | POA: Diagnosis not present

## 2016-11-07 DIAGNOSIS — Z79899 Other long term (current) drug therapy: Secondary | ICD-10-CM | POA: Insufficient documentation

## 2016-11-07 DIAGNOSIS — Z951 Presence of aortocoronary bypass graft: Secondary | ICD-10-CM | POA: Insufficient documentation

## 2016-11-07 DIAGNOSIS — Z7982 Long term (current) use of aspirin: Secondary | ICD-10-CM | POA: Insufficient documentation

## 2016-11-07 DIAGNOSIS — I2510 Atherosclerotic heart disease of native coronary artery without angina pectoris: Secondary | ICD-10-CM | POA: Insufficient documentation

## 2016-11-07 DIAGNOSIS — I252 Old myocardial infarction: Secondary | ICD-10-CM | POA: Insufficient documentation

## 2016-11-07 DIAGNOSIS — I10 Essential (primary) hypertension: Secondary | ICD-10-CM | POA: Insufficient documentation

## 2016-11-07 DIAGNOSIS — R079 Chest pain, unspecified: Secondary | ICD-10-CM

## 2016-11-07 DIAGNOSIS — E119 Type 2 diabetes mellitus without complications: Secondary | ICD-10-CM | POA: Insufficient documentation

## 2016-11-07 LAB — BASIC METABOLIC PANEL
ANION GAP: 6 (ref 5–15)
BUN: 11 mg/dL (ref 6–20)
CHLORIDE: 108 mmol/L (ref 101–111)
CO2: 24 mmol/L (ref 22–32)
Calcium: 9 mg/dL (ref 8.9–10.3)
Creatinine, Ser: 0.64 mg/dL (ref 0.61–1.24)
GFR calc non Af Amer: >60 mL/min (ref >60)
GLUCOSE: 188 mg/dL — AB (ref 65–99)
Potassium: 4.1 mmol/L (ref 3.5–5.1)
Sodium: 138 mmol/L (ref 135–145)

## 2016-11-07 LAB — CBC
HEMATOCRIT: 36.8 % — AB (ref 39.0–52.0)
HEMOGLOBIN: 12.3 g/dL — AB (ref 13.0–17.0)
MCH: 29 pg (ref 26.0–34.0)
MCHC: 33.4 g/dL (ref 30.0–36.0)
MCV: 86.8 fL (ref 78.0–100.0)
Platelets: 243 10*3/uL (ref 150–400)
RBC: 4.24 MIL/uL (ref 4.22–5.81)
RDW: 13.4 % (ref 11.5–15.5)
WBC: 10.7 10*3/uL — ABNORMAL HIGH (ref 4.0–10.5)

## 2016-11-07 LAB — I-STAT TROPONIN, ED: Troponin i, poc: 0 ng/mL (ref 0.00–0.08)

## 2016-11-07 NOTE — ED Provider Notes (Signed)
[facility] DEPT Provider Note   CSN: [id number] Arrival date & time: 11/07/16  0736     History   Chief Complaint Chief Complaint  Patient presents with  . Chest Pain  . Dental Pain    HPI [name] [name] is a 56 y.o. male with PMHx of of unstable angina in 2013 where he underwent bare-metal stent placement to the proximal RCA, he then had another episode of nonsustained the in February 2015 and underwent 2 site PCI to the circumflex and OM 2 with bare-metal stents, CAD, NSTEMI 2015, COPD, OSA on CPAP, DM type 2, GERD, presenting with complaints of "I was told I had a blockage in my artery last month by one of your nurses."  He reports associated unchanged chest pain that is intermittent, about once every 3 days, dull, 7/10 lasting "a few minutes. He denies any chest pain today. He states "it's nothing like it was years ago where they took to my [employer] and I did rehab." He states he usually takes a nitrostat with complete relief of symptoms. He reports dry cough and unchanged SOB when walking and during summer time. He denies any SOB at rest. He denies any night sweats. He denies any numbness or tingling besides "some in his feet" that he states has had for "a long time". He denies fevers, chills, nausea, vomiting, diarrhea, changes in urination. He states he came in today because he was told he had a blockage in an artery by a nurse last month and was concerning him. He states he came today because he needed to go to all his upcoming appointments before he can come here to be seen for his concern. He states he takes all his medications as prescribed. 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  He states he has appointments at [facility] 3/15, and PCP 3/28.   Patient also endorses dental pain. He endorses dull, intermittent, 8/10. He states he takes aleve which relieves the pain.  He states he has a Pharmacist, community. He states he just had 3 pulled this past thursday and his  next appointment is next week 3/8 to pull out more teeth and for evaluation for dentures.    The history is provided by the patient. No language interpreter was used.    Past Medical History:  Diagnosis Date  . Alcoholism ([place])   . Anxiety   . Arthritis    "qwhere"  . Asthma   . CAD S/P percutaneous coronary angioplasty 01/18/12; 10/2013   a. pRCA 3.5 x 18 vision BMS - 4.2 mm; b. 2/'15: mCx 3.5 x 12  Rebel BMS (3.6-3.7 mm)  . Chronic back pain    "mid/lower" (08/01/2014)  . COPD (chronic obstructive pulmonary disease) ([place])    "I'm seeing COPD dr now; don't know if I've got it" (08/01/2014)  . Depression   . GERD (gastroesophageal reflux disease)   . Hyperlipidemia   . Hypertension   . Migraines    "once in awhile" (08/01/2014)  . Non-ST elevation myocardial infarction (NSTEMI) ([place]) 10/2013  . OSA on CPAP 01/19/2012  . Type II diabetes mellitus [facility])     Patient Active Problem List   Diagnosis Date Noted  . Depression, acute 02/17/2016  . Hearing difficulty 12/10/2014  . COPD, moderate ([place]) 11/22/2014  . Cough   . Angina, class II ([place]) 08/01/2014  . H/O NSTEMI (non-ST elevated myocardial infarction) 11/07/2013  . SOB (shortness of breath) on exertion 10/04/2013  . Pre-ulcerative  corn or callous 10/12/2012  . Dental caries 10/12/2012  . Back pain 08/24/2012  . History of tobacco abuse 08/24/2012  . Obesity 07/31/2012  . DM type 2 (diabetes mellitus, type 2) ([place]) 06/05/2012  . CAD S/P percutaneous coronary angioplasty 01/21/2012  . OSA on CPAP 01/19/2012  . Essential hypertension 01/18/2012  . Hyperlipidemia with target LDL less than 70 01/18/2012    Past Surgical History:  Procedure Laterality Date  . CARDIAC CATHETERIZATION  07/2014   Left Main: Short, large-caliber vessel. Widely patent. Bifurcates into the LAD and Circumflex. Angiographically normal.  . CORONARY ANGIOPLASTY WITH STENT PLACEMENT  01/2012; 11/07/2013; 08/01/2014   "1 + 2 + 1"   . CORONARY  ANGIOPLASTY WITH STENT PLACEMENT  07/2014   Severe single-vessel disease involving the bifurcation of OM1 and OM 2 with 99% in-stent restenosis in the bare-metal stent placed in the OM 2.  . LEFT HEART CATHETERIZATION WITH CORONARY ANGIOGRAM N/A 01/20/2012   Procedure: LEFT HEART CATHETERIZATION WITH CORONARY ANGIOGRAM;  Surgeon: Jettie Booze, MD;  Location: [facility] CATH LAB;  Service: Cardiovascular;  Laterality: N/A;  . LEFT HEART CATHETERIZATION WITH CORONARY ANGIOGRAM N/A 11/07/2013   Procedure: LEFT HEART CATHETERIZATION WITH CORONARY ANGIOGRAM;  Surgeon: Jettie Booze, MD;  Location: [facility] CATH LAB;  Service: Cardiovascular;  Laterality: N/A;  . LEFT HEART CATHETERIZATION WITH CORONARY ANGIOGRAM N/A 08/01/2014   Procedure: LEFT HEART CATHETERIZATION WITH CORONARY ANGIOGRAM;  Surgeon: Leonie Man, MD;  Location: [facility] CATH LAB;  Service: Cardiovascular;  Laterality: N/A;  . PERCUTANEOUS CORONARY STENT INTERVENTION (PCI-S)  01/20/2012   Procedure: PERCUTANEOUS CORONARY STENT INTERVENTION (PCI-S);  Surgeon: Jettie Booze, MD;  Location: [facility] CATH LAB;  Service: Cardiovascular;;  . PERCUTANEOUS CORONARY STENT INTERVENTION (PCI-S)  11/07/2013   Procedure: PERCUTANEOUS CORONARY STENT INTERVENTION (PCI-S);  Surgeon: Jettie Booze, MD;  Location: [facility] CATH LAB;  Service: Cardiovascular;;       Home Medications    Prior to Admission medications   Medication Sig Start Date End Date Taking? Authorizing Provider  aspirin 81 MG chewable tablet Chew 1 tablet (81 mg total) by mouth daily. 10/04/13  Yes [name], MD  atorvastatin (LIPITOR) 80 MG tablet TAKE 1 TABLET (80 MG TOTAL) BY MOUTH DAILY. 10/17/16  Yes [name], MD  carvedilol (COREG) 3.125 MG tablet Take 1 tablet (3.125 mg total) by mouth 2 (two) times daily with a meal. Patient taking differently: Take 3.125 mg by mouth daily.  05/23/16  Yes [name], MD  citalopram (CELEXA) 20 MG tablet Take 1 tablet (20 mg total) by  mouth daily. 02/17/16  Yes [name], MD  insulin aspart (NOVOLOG) 100 UNIT/ML injection INJECT 45 UNITS INTO THE SKIN TWICE DAILY WITH MEALS 09/15/16  Yes Zenia Resides, MD  insulin glargine (LANTUS) 100 UNIT/ML injection INJECT 0.50 MILLILITERS (50 UNITS TOTAL) INTO THE SKIN TWICE A DAY 09/15/16  Yes Zenia Resides, MD  Liraglutide 18 MG/3ML SOPN Inject 0.3 mLs (1.8 mg total) into the skin daily. Dispense QS for 1 month supply 05/23/16  Yes [name], MD  lisinopril (PRINIVIL,ZESTRIL) 2.5 MG tablet Take 1 tablet (2.5 mg total) by mouth 2 (two) times daily. 08/23/16  Yes [name], MD  metFORMIN (GLUCOPHAGE) 1000 MG tablet Take 1 tablet (1,000 mg total) by mouth 2 (two) times daily with a meal. 07/19/16  Yes [name], MD  mometasone-formoterol [facility]) 200-5 MCG/ACT AERO Use as directed. Patient taking differently: Inhale 2 puffs  into the lungs. Use as directed. 11/02/16  Yes [name], MD  Multiple Vitamin (MULTIVITAMIN) tablet Take 1 tablet by mouth daily.   Yes Historical Provider, MD  nitroGLYCERIN (NITROSTAT) 0.4 MG SL tablet Place 1 tablet (0.4 mg total) under the tongue every 5 (five) minutes x 3 doses as needed for chest pain. 09/27/16  Yes [name], MD  Omega-3 Fatty Acids (FISH OIL PO) Take 1,000 mg by mouth 2 (two) times daily.    Yes Historical Provider, MD  prasugrel (EFFIENT) 10 MG TABS tablet Take 1 tablet (10 mg total) by mouth daily. 09/14/16  Yes Peter M Martinique, MD  traMADol (ULTRAM) 50 MG tablet Take 1 tablet (50 mg total) by mouth every 6 (six) hours as needed. Patient taking differently: Take 50 mg by mouth every 6 (six) hours as needed for moderate pain.  05/23/16  Yes [name], MD  ACCU-CHEK FASTCLIX LANCETS MISC 1 Units by Percutaneous route 4 (four) times daily. 07/01/15   [name], MD  amoxicillin-clavulanate (AUGMENTIN) 875-125 MG tablet Take 1 tablet by mouth 2 (two) times daily. One po bid x 7 days Patient not taking: Reported on 11/07/2016  09/20/16   Lacretia Leigh, MD  B-D INS SYR ULTRAFINE 1CC/31G 31G X 5/16" 1 ML MISC INJECT 5 TIMES DAILY AS DIRECTED. 07/18/16   [name], MD  Blood Glucose Monitoring Suppl (ACCU-CHEK AVIVA PLUS) w/Device KIT Check fasting, and two hours after breakfast, lunch and dinner. ICD-10 code E11.9. 08/31/16   [name], MD  Blood Glucose Monitoring Suppl (ACCU-CHEK NANO SMARTVIEW) w/Device KIT 1 Units by Does not apply route as directed. Check fasting, and two hours after breakfast, lunch and dinner. ICD-10 code E11.9. 06/17/16   [name], MD  glucose blood (ACCU-CHEK AVIVA) test strip Use test blood sugar 4 times a day. ICD-10 code: E11.9 08/09/16   [name], MD  Insulin Pen Needle (BD ULTRA-FINE PEN NEEDLES) 29G X 12.7MM MISC Use to with Victoza Pen, injection once daily. 07/22/15   [name], MD  Lancets (ACCU-CHEK SOFT TOUCH) lancets 1 each by Other route 4 (four) times daily. Use as instructed 09/09/16   [name], MD    Family History Family History  Problem Relation Age of Onset  . Heart attack Father 44  . Breast cancer Mother   . Hypertension Sister   . Hyperlipidemia Sister   . Hyperlipidemia Sister   . Hypertension Sister   . Heart attack Brother   . Stroke Brother   . Diabetes Brother   . Diabetes Sister     Social History Social History  Substance Use Topics  . Smoking status: Former Smoker    Packs/day: 0.50    Years: 37.00    Types: Cigarettes    Quit date: 12/18/2014  . Smokeless tobacco: Never Used     Comment: pt reports "I quit 66 days ago" (05/09/16)  . Alcohol use 3.6 oz/week    6 Cans of beer per week     Comment: pt reports "I quit 66 days ago" (05/09/16)     Allergies   Shellfish allergy   Review of Systems Review of Systems  Constitutional: Negative for chills and fever.  Respiratory: Positive for cough and shortness of breath.   Cardiovascular: Positive for chest pain.  Gastrointestinal: Negative for abdominal pain, diarrhea, nausea  and vomiting.  Genitourinary: Negative for difficulty urinating and dysuria.  Skin: Negative for wound.  All other  systems reviewed and are negative.    Physical Exam Updated Vital Signs BP 130/75   Pulse 64   Temp 97.7 F (36.5 C) (Oral)   Resp 10   Wt 122.5 kg   SpO2 97%   BMI 42.29 kg/m   Physical Exam  Constitutional: He is oriented to person, place, and time. He appears well-developed and well-nourished.  Well appearing  HENT:  Head: Normocephalic and atraumatic.  Nose: Nose normal.  Mouth/Throat: Oropharynx is clear and moist.  Dentition abnormal. No signs of redness, swelling, abcsess.   Eyes: Conjunctivae and EOM are normal.  Neck: Normal range of motion.  no lymphadenopathy, pain redness, swelling, or tenderness noted.   Cardiovascular: Normal rate, normal heart sounds and intact distal pulses.   No murmur heard. Pulmonary/Chest: Effort normal and breath sounds normal. No respiratory distress. He has no wheezes. He has no rales.  Normal work of breathing. No respiratory distress noted.   Abdominal: Soft. Bowel sounds are normal. There is no tenderness. There is no rebound and no guarding.  Soft and nontender. Negative murphy's sign. No focal tenderness at mcburney's point.   Musculoskeletal: Normal range of motion.  Neurological: He is alert and oriented to person, place, and time.  Skin: Skin is warm.  No pedal edema noted.   Psychiatric: He has a normal mood and affect. His behavior is normal.  Nursing note and vitals reviewed.    ED Treatments / Results  Labs (all labs ordered are listed, but only abnormal results are displayed) Labs Reviewed  BASIC METABOLIC PANEL - Abnormal; Notable for the following:       Result Value   Glucose, Bld 188 (*)    All other components within normal limits  CBC - Abnormal; Notable for the following:    WBC 10.7 (*)    Hemoglobin 12.3 (*)    HCT 36.8 (*)    All other components within normal limits  I-STAT  TROPOININ, ED    EKG  EKG Interpretation  Date/Time:  Monday November 07 2016 07:54:37 EST Ventricular Rate:  69 PR Interval:    QRS Duration: 88 QT Interval:  403 QTC Calculation: 432 R Axis:   17 Text Interpretation:  Sinus rhythm normal. no change  Confirmed by Johnney Killian, MD, Jeannie Done [id number]) on 11/07/2016 9:13:39 AM       Radiology Dg Chest 2 View  Result Date: 11/07/2016 CLINICAL DATA:  Intermittent chest pain, worse yesterday. EXAM: CHEST  2 VIEW COMPARISON:  11/22/2014. FINDINGS: Borderline cardiac enlargement. Mild vascular congestion. No active infiltrates or failure. No effusion or pneumothorax. Bones unremarkable. Stable appearance from priors. IMPRESSION: Borderline cardiomegaly. Mild vascular congestion. No active infiltrates or failure. Electronically Signed   By: Staci Righter M.D.   On: 11/07/2016 08:14    Procedures Procedures (including critical care time)  Medications Ordered in ED Medications - No data to display   Initial Impression / Assessment and Plan / ED Course  I have reviewed the triage vital signs and the nursing notes.  Pertinent labs & imaging results that were available during my care of the patient were reviewed by me and considered in my medical decision making (see chart for details).    Pt is a 56 yo male presents for "blockage in artery" he was told he had 1 month ago by a nurse. He has had unchanged CP for several years that comes on about every 3 days lasting several minutes and is completely relieved by his nitro. He has no  active chest pain today. Low suspicion for ACS, Pe, dissection, aortic aneurysm at this time. He is in no apparent distress, hemodynamically stable, afebrile. No tracheal deviation, no JVD or new murmur, RRR, breath sounds equal bilaterally, lab work is reassuring. He has minimal nonspecific leukocytosis. EKG without acute abnormalities, negative troponin, and negative CXR. Chest x-ray does show borderline cardiomegaly. Pt  has been advised to return to the ED if CP becomes exertional, associated with diaphoresis or nausea, radiates to left jaw/arm, worsens or becomes concerning in any way.  Patient also had dentalgia. He's being followed closely by dentist. He states he had 3 teeth pulled last week by dentist and will have several other teeth pulled next week, and is being evaluated for dentures. Aleve completely relieves his symptoms. No visible abscess. No evidence of ludwigs angina. No lymphadenopathy. Pt appears reliable for follow up and is agreeable to discharge. Patient is in no acute distress. Vital Signs are stable. Patient is able to ambulate.  Patient is to be discharged with recommendation to follow up with PCP in regards to today's hospital visit. Patient heart he has an appointment next week with his PCP and is currently being followed by cardiologist this next appointment is July. Patient able to tolerate PO. Patient verbalizes understanding. He is agreeable with assessment and plan. Reasons to immediately return to the emergency department discussed.  Case discussed with Dr. Johnney Killian who agreed with assessment and plan.   Final Clinical Impressions(s) / ED Diagnoses   Final diagnoses:  Nonspecific chest pain  Pain, dental    New Prescriptions New Prescriptions   No medications on file     [place], [place] 11/07/16 [place], [place] 11/07/16 [place], [place] 11/07/16 [phone]

## 2016-11-07 NOTE — Discharge Instructions (Signed)
Please follow up with your cardiologist regarding today's visit. Please make sure to go to your Primary care provider next week at your scheduled appointment. Please also make sure to go to your dental appointment that you already have scheduled next week regarding your dental pain.   Get help right away if: Your chest pain is worse. You have a cough that gets worse, or you cough up blood. You have severe pain in your abdomen. You have severe weakness. You faint. You have sudden, unexplained chest discomfort. You have sudden, unexplained discomfort in your arms, back, neck, or jaw. You have shortness of breath at any time. You suddenly start to sweat, or your skin gets clammy. You feel nauseous or you vomit. You suddenly feel light-headed or dizzy. Your heart begins to beat quickly, or it feels like it is skipping beats.

## 2016-11-07 NOTE — ED Triage Notes (Signed)
Pt reports intermittent CP for the past few years. Accompanied by SOB, but reports this is normal. Also having intermittent dizziness. Pt reports on a previous visit he was told he had a blocked artery. Also complains of L lower dental pain.

## 2016-11-07 NOTE — ED Notes (Signed)
Patient transported to X-ray 

## 2016-11-09 ENCOUNTER — Other Ambulatory Visit: Payer: Self-pay | Admitting: Family Medicine

## 2016-12-07 ENCOUNTER — Encounter: Payer: Self-pay | Admitting: Family Medicine

## 2016-12-07 ENCOUNTER — Ambulatory Visit (INDEPENDENT_AMBULATORY_CARE_PROVIDER_SITE_OTHER): Payer: Medicaid Other | Admitting: Family Medicine

## 2016-12-07 VITALS — BP 122/66 | HR 71 | Temp 98.7°F | Ht 67.0 in | Wt 274.4 lb

## 2016-12-07 DIAGNOSIS — Z794 Long term (current) use of insulin: Secondary | ICD-10-CM

## 2016-12-07 DIAGNOSIS — E1159 Type 2 diabetes mellitus with other circulatory complications: Secondary | ICD-10-CM

## 2016-12-07 DIAGNOSIS — J449 Chronic obstructive pulmonary disease, unspecified: Secondary | ICD-10-CM | POA: Diagnosis not present

## 2016-12-07 DIAGNOSIS — I2510 Atherosclerotic heart disease of native coronary artery without angina pectoris: Secondary | ICD-10-CM

## 2016-12-07 DIAGNOSIS — I10 Essential (primary) hypertension: Secondary | ICD-10-CM

## 2016-12-07 DIAGNOSIS — Z9861 Coronary angioplasty status: Secondary | ICD-10-CM | POA: Diagnosis not present

## 2016-12-07 DIAGNOSIS — I209 Angina pectoris, unspecified: Secondary | ICD-10-CM | POA: Diagnosis not present

## 2016-12-07 DIAGNOSIS — H9193 Unspecified hearing loss, bilateral: Secondary | ICD-10-CM

## 2016-12-07 LAB — POCT GLYCOSYLATED HEMOGLOBIN (HGB A1C): Hemoglobin A1C: 7.8

## 2016-12-07 MED ORDER — INSULIN ASPART 100 UNIT/ML ~~LOC~~ SOLN: SUBCUTANEOUS | 5 refills | Status: DC

## 2016-12-07 NOTE — Assessment & Plan Note (Signed)
Re-refer to ENT for possible hearing aids.

## 2016-12-07 NOTE — Assessment & Plan Note (Signed)
Continue Metformin, lantus, novolog, victoza--great work on HgbA1C begin < 8. Due for opthalmology exam in 7/18.

## 2016-12-07 NOTE — Assessment & Plan Note (Signed)
Continue Dulera, continue to encourage no smoking.

## 2016-12-07 NOTE — Assessment & Plan Note (Signed)
Continue his NTG and Effient

## 2016-12-07 NOTE — Assessment & Plan Note (Signed)
BP is well controlled. Continue lisinopril, coreg

## 2016-12-07 NOTE — Assessment & Plan Note (Signed)
Continue his core and lipitor and ASA

## 2016-12-07 NOTE — Patient Instructions (Signed)
Carbohydrate Counting for Diabetes Mellitus, Adult Carbohydrate counting is a method for keeping track of how many carbohydrates you eat. Eating carbohydrates naturally increases the amount of sugar (glucose) in the blood. Counting how many carbohydrates you eat helps keep your blood glucose within normal limits, which helps you manage your diabetes (diabetes mellitus). It is important to know how many carbohydrates you can safely have in each meal. This is different for every person. A diet and nutrition specialist (registered dietitian) can help you make a meal plan and calculate how many carbohydrates you should have at each meal and snack. Carbohydrates are found in the following foods:  Grains, such as breads and cereals.  Dried beans and soy products.  Starchy vegetables, such as potatoes, peas, and corn.  Fruit and fruit juices.  Milk and yogurt.  Sweets and snack foods, such as cake, cookies, candy, chips, and soft drinks. How do I count carbohydrates? There are two ways to count carbohydrates in food. You can use either of the methods or a combination of both. Reading "Nutrition Facts" on packaged food  The "Nutrition Facts" list is included on the labels of almost all packaged foods and beverages in the [place] It includes:  The serving size.  Information about nutrients in each serving, including the grams (g) of carbohydrate per serving. To use the "Nutrition Facts":  Decide how many servings you will have.  Multiply the number of servings by the number of carbohydrates per serving.  The resulting number is the total amount of carbohydrates that you will be having. Learning standard serving sizes of other foods  When you eat foods containing carbohydrates that are not packaged or do not include "Nutrition Facts" on the label, you need to measure the servings in order to count the amount of carbohydrates:  Measure the foods that you will eat with a food scale or measuring  cup, if needed.  Decide how many standard-size servings you will eat.  Multiply the number of servings by 15. Most carbohydrate-rich foods have about 15 g of carbohydrates per serving.  For example, if you eat 8 oz (170 g) of strawberries, you will have eaten 2 servings and 30 g of carbohydrates (2 servings x 15 g = 30 g).  For foods that have more than one food mixed, such as soups and casseroles, you must count the carbohydrates in each food that is included. The following list contains standard serving sizes of common carbohydrate-rich foods. Each of these servings has about 15 g of carbohydrates:   hamburger bun or  English muffin.   oz (15 mL) syrup.   oz (14 g) jelly.  1 slice of bread.  1 six-inch tortilla.  3 oz (85 g) cooked rice or pasta.  4 oz (113 g) cooked dried beans.  4 oz (113 g) starchy vegetable, such as peas, corn, or potatoes.  4 oz (113 g) hot cereal.  4 oz (113 g) mashed potatoes or  of a large baked potato.  4 oz (113 g) canned or frozen fruit.  4 oz (120 mL) fruit juice.  4-6 crackers.  6 chicken nuggets.  6 oz (170 g) unsweetened dry cereal.  6 oz (170 g) plain fat-free yogurt or yogurt sweetened with artificial sweeteners.  8 oz (240 mL) milk.  8 oz (170 g) fresh fruit or one small piece of fruit.  24 oz (680 g) popped popcorn. Example of carbohydrate counting Sample meal  3 oz (85 g) chicken breast.  6 oz (  170 g) brown rice.  4 oz (113 g) corn.  8 oz (240 mL) milk.  8 oz (170 g) strawberries with sugar-free whipped topping. Carbohydrate calculation 1. Identify the foods that contain carbohydrates:  Rice.  Corn.  Milk.  Strawberries. 2. Calculate how many servings you have of each food:  2 servings rice.  1 serving corn.  1 serving milk.  1 serving strawberries. 3. Multiply each number of servings by 15 g:  2 servings rice x 15 g = 30 g.  1 serving corn x 15 g = 15 g.  1 serving milk x 15 g = 15  g.  1 serving strawberries x 15 g = 15 g. 4. Add together all of the amounts to find the total grams of carbohydrates eaten:  30 g + 15 g + 15 g + 15 g = 75 g of carbohydrates total. This information is not intended to replace advice given to you by your health care provider. Make sure you discuss any questions you have with your health care provider. Document Released: 08/29/2005 Document Revised: 03/18/2016 Document Reviewed: 02/10/2016 Elsevier Interactive Patient Education  2017 Elsevier Inc.  

## 2016-12-07 NOTE — Progress Notes (Signed)
   Subjective:    Patient ID: [name] [name] is a 56 y.o. male presenting with Follow-up  on 12/07/2016  HPI: Patient is doing well. He reports improved glycemic control. Taking meds as directed. He has a disability hearing coming up. He was seen with stable angina in the ED in February. He needs another referral to ENT due to poor hearing. Previously had tubes placed, but they did not help. Would like to have hearing aids if possible.  Review of Systems  Constitutional: Negative for chills and fever.  Respiratory: Negative for shortness of breath.   Cardiovascular: Negative for leg swelling.  Gastrointestinal: Negative for abdominal pain, nausea and vomiting.      Objective:    BP 122/66   Pulse 71   Temp 98.7 F (37.1 C) (Oral)   Ht 5\' 7"  (1.702 m)   Wt 274 lb 6.4 oz (124.5 kg)   SpO2 93%   BMI 42.98 kg/m  Physical Exam  Constitutional: He appears well-developed and well-nourished. No distress.  HENT:  Head: Normocephalic and atraumatic.  Eyes: No scleral icterus.  Neck: Neck supple.  Cardiovascular: Normal rate.   Pulmonary/Chest: Effort normal.  Abdominal: Soft.  Musculoskeletal: He exhibits no edema.  Neurological: He is alert.  Skin: Skin is warm.  Psychiatric: He has a normal mood and affect.  Vitals reviewed.   Hgb A1C is 7.8 today    Assessment & Plan:   Problem List Items Addressed This Visit      Medium   Essential hypertension (Chronic)    BP is well controlled. Continue lisinopril, coreg      CAD S/P percutaneous coronary angioplasty (Chronic)    Continue his core and lipitor and ASA      DM type 2 (diabetes mellitus, type 2) (HCC) - Primary    Continue Metformin, lantus, novolog, victoza--great work on HgbA1C begin < 8. Due for opthalmology exam in 7/18.      Relevant Medications   insulin aspart (NOVOLOG) 100 UNIT/ML injection   Other Relevant Orders   HgB A1c (Completed)     Unprioritized   Angina, class II (HCC)    Continue his NTG  and Effient      COPD, moderate ([place])    Continue Dulera, continue to encourage no smoking.      Hearing difficulty    Re-refer to ENT for possible hearing aids.      Relevant Orders   Ambulatory referral to ENT      Total face-to-face time with patient: 25 minutes. Over 50% of encounter was spent on counseling and coordination of care. Return in about 3 months (around 03/09/2017).  [name] 12/07/2016 1:50 PM

## 2016-12-20 ENCOUNTER — Encounter: Payer: Self-pay | Admitting: Pharmacist

## 2016-12-20 ENCOUNTER — Ambulatory Visit (INDEPENDENT_AMBULATORY_CARE_PROVIDER_SITE_OTHER): Payer: Medicaid Other | Admitting: Pharmacist

## 2016-12-20 DIAGNOSIS — Z87891 Personal history of nicotine dependence: Secondary | ICD-10-CM

## 2016-12-20 DIAGNOSIS — J449 Chronic obstructive pulmonary disease, unspecified: Secondary | ICD-10-CM | POA: Diagnosis not present

## 2016-12-20 MED ORDER — ALBUTEROL SULFATE HFA 108 (90 BASE) MCG/ACT IN AERS: 2.0000 | INHALATION_SPRAY | Freq: Four times a day (QID) | RESPIRATORY_TRACT | 0 refills | Status: DC | PRN

## 2016-12-20 MED ORDER — FLUTICASONE FUROATE-VILANTEROL 200-25 MCG/INH IN AEPB: 1.0000 | INHALATION_SPRAY | Freq: Every day | RESPIRATORY_TRACT | 0 refills | Status: DC

## 2016-12-20 NOTE — Assessment & Plan Note (Signed)
COPD/PFT Spirometry evaluation reveals reduced FEV and FVC ~ 70 % predicted from normal.  Compared to previous eval in 2015 his lung function is modestly improved. He reports no exacerbations in the past year and an mMRC score of >2  His lung function tests improved from past since he has cut back on smoking. Patient has been experiencing intermitted episodes of shortness of breath with exertion. He was not able to obtain his [facility] finances, so his maintenance inhaler was changed to Kellogg 200-25 mcg/inhalation - 1 puff daily. A 28 day supply of samples were given to him. A prescription for albuterol MDI was also sent to the pharmacy. Educated patient on purpose, proper use, potential adverse effects including need to rinse mouth after each use.  Reviewed results of pulmonary function tests.  Pt verbalized understanding of results and education.  Written pt instructions provided.

## 2016-12-20 NOTE — Assessment & Plan Note (Signed)
Smoking Cessation longstanding, moderate tobacco abuse with a prolonged quit for 108 days end of 2017.   Recently he reports starting to smoke about 4 cigarettes per day due to cold weather. Has has not smoked for 3 days. Educated on importance of quitting to improve lung function, and patient understood. He continues to use mints to help deter his cravings.

## 2016-12-20 NOTE — Patient Instructions (Signed)
Thank you for coming today!  Today, your Ruthe Mannan was changed to the [employer]. You will inhale 1 puff one time daily. Remember to rinse your mouth out after each inhalation. Continue to do your best with quitting smoking, and call clinic with questions or concerns.   Continue the same dose of your insulin regimens.   Please come back to see Dr. Valentina Lucks in clinic in 4 weeks to discuss how your new inhaler is going.

## 2016-12-20 NOTE — Progress Notes (Signed)
   S:    Patient arrives ambulating without assistance.    Presents for lung function evaluation.   Patient was referred previously by PCP, and was seen by Dr. Kennon Rounds on 12/07/2016.   Patient reports breathing has been pretty good overall, but does get short of breath while walking his dog. He needs to stop to catch his breath.  Patient reports last dose of COPD medications was 3-4 days ago, he has been out of his Ruthe Mannan since then. Does not have albuterol left at home.  O: mMRC score= >2 See Documentation Flowsheet - CAT/COPD for complete symptom scoring.  See "scanned report" or Documentation Flowsheet (discrete results - PFTs) for  Spirometry results. Patient provided good effort while attempting spirometry.    A/P: COPD/PFT Spirometry evaluation reveals reduced FEV and FVC ~ 70 % predicted from normal.  Compared to previous eval in 2015 his lung function is modestly improved. He reports no exacerbations in the past year and an mMRC score of >2  His lung function tests improved from past since he has cut back on smoking. Patient has been experiencing intermitted episodes of shortness of breath with exertion. He was not able to obtain his [facility] finances, so his maintenance inhaler was changed to Kellogg 200-25 mcg/inhalation - 1 puff daily. A 28 day supply of samples were given to him. A prescription for albuterol MDI was also sent to the pharmacy. Educated patient on purpose, proper use, potential adverse effects including need to rinse mouth after each use.  Reviewed results of pulmonary function tests.  Pt verbalized understanding of results and education.  Written pt instructions provided.    Smoking Cessation longstanding, moderate tobacco abuse with a prolonged quit for 108 days end of 2017.   Recently he reports starting to smoke about 4 cigarettes per day due to cold weather. Has has not smoked for 3 days. Educated on importance of quitting to improve lung function, and patient  understood. He continues to use mints to help deter his cravings.  Diabetes - well controlled: No changes were made to patient's insulin regimen. His last A1c was 7.8 and he reported that he had not experienced any hypoglycemic events. Novolog samples were given.   F/U Clinic visit in 1 month.   Total time in face to face counseling 40 minutes.  Patient seen with Ida Rogue, PharmD Candidate, and Demetrius Charity, PharmD PGY-1 Resident.  Marland Kitchen

## 2016-12-21 NOTE — Progress Notes (Signed)
Patient ID: [name] [name], male   DOB: 05/07/1961, 56 y.o.   MRN: [id number] Reviewed: Agree with Dr. Graylin Shiver documentation and management.

## 2016-12-23 ENCOUNTER — Other Ambulatory Visit: Payer: Self-pay | Admitting: Cardiology

## 2016-12-26 ENCOUNTER — Encounter: Payer: Self-pay | Admitting: Family Medicine

## 2016-12-29 ENCOUNTER — Other Ambulatory Visit: Payer: Self-pay | Admitting: Family Medicine

## 2016-12-29 DIAGNOSIS — I10 Essential (primary) hypertension: Secondary | ICD-10-CM

## 2016-12-29 MED ORDER — LISINOPRIL 2.5 MG PO TABS: 2.5000 mg | ORAL_TABLET | Freq: Two times a day (BID) | ORAL | 3 refills | Status: DC

## 2016-12-29 NOTE — Telephone Encounter (Signed)
Pt needs a refill on lisinopril, pharmacy told pt he could not refill until May 20th. Pt only has 6 pills left. Pt has been taking 1/2 a tablet in the am and 1/2 tablet in pm. Pt uses [facility] [address]. ep

## 2017-01-02 ENCOUNTER — Encounter: Payer: Self-pay | Admitting: Family Medicine

## 2017-01-17 ENCOUNTER — Telehealth: Payer: Self-pay | Admitting: Cardiology

## 2017-01-17 ENCOUNTER — Telehealth: Payer: Self-pay | Admitting: Family Medicine

## 2017-01-17 DIAGNOSIS — Z9861 Coronary angioplasty status: Principal | ICD-10-CM

## 2017-01-17 DIAGNOSIS — I2510 Atherosclerotic heart disease of native coronary artery without angina pectoris: Secondary | ICD-10-CM

## 2017-01-17 MED ORDER — PRASUGREL HCL 10 MG PO TABS
10.0000 mg | ORAL_TABLET | Freq: Every day | ORAL | 3 refills | Status: DC
Start: 2017-01-17 — End: 2017-04-03

## 2017-01-17 NOTE — Telephone Encounter (Signed)
Returned call to patient office out of Effient samples.Prescription sent to your pharmacy.

## 2017-01-17 NOTE — Telephone Encounter (Signed)
Patient calling the office for samples of medication:   1.  What medication and dosage are you requesting samples for? Effient  2.  Are you currently out of this medication? Not quite

## 2017-01-17 NOTE — Telephone Encounter (Signed)
LM for patient that he doesn't need a new referral to see the ENT as he is already established with them.  Advised patient to call them to schedule an appointment for this. Jazmin Hartsell,CMA

## 2017-01-17 NOTE — Telephone Encounter (Signed)
Pt needs a referral for [facility] to have his ears cleaned. ep

## 2017-01-24 ENCOUNTER — Encounter: Payer: Self-pay | Admitting: Pharmacist

## 2017-01-24 ENCOUNTER — Ambulatory Visit (INDEPENDENT_AMBULATORY_CARE_PROVIDER_SITE_OTHER): Payer: Medicaid Other | Admitting: Pharmacist

## 2017-01-24 DIAGNOSIS — Z794 Long term (current) use of insulin: Secondary | ICD-10-CM | POA: Diagnosis not present

## 2017-01-24 DIAGNOSIS — E1159 Type 2 diabetes mellitus with other circulatory complications: Secondary | ICD-10-CM | POA: Diagnosis not present

## 2017-01-24 DIAGNOSIS — J449 Chronic obstructive pulmonary disease, unspecified: Secondary | ICD-10-CM | POA: Diagnosis not present

## 2017-01-24 DIAGNOSIS — Z87891 Personal history of nicotine dependence: Secondary | ICD-10-CM

## 2017-01-24 MED ORDER — INSULIN ASPART 100 UNIT/ML ~~LOC~~ SOLN: SUBCUTANEOUS | 5 refills | Status: DC

## 2017-01-24 MED ORDER — MOMETASONE FURO-FORMOTEROL FUM 200-5 MCG/ACT IN AERO: 1.0000 | INHALATION_SPRAY | Freq: Two times a day (BID) | RESPIRATORY_TRACT | 11 refills | Status: DC

## 2017-01-24 NOTE — Progress Notes (Signed)
Patient ID: [name] [name], male   DOB: 07/05/1961, 56 y.o.   MRN: [id number] Reviewed: agree with Dr. Graylin Shiver documentation and management.

## 2017-01-24 NOTE — Patient Instructions (Signed)
Continue Dulera 1 puff into the lungs two times daily.  Continue current insulin regimen.  Follow-up with pharmacy clinic as needed.

## 2017-01-24 NOTE — Assessment & Plan Note (Signed)
Longstanding tobacco abuse. Reports complete abstinence for ~ 1 month.   Appears motivated to continue with complete abstinence from tobacco.

## 2017-01-24 NOTE — Assessment & Plan Note (Signed)
Longstanding tobacco abuse. Reports complete abstinence for ~ 1 month.   Appears motivated to continue with complete abstinence from tobacco.  For his shortness of breath and chronic lung dyspnea, continued [facility] - sent new prescription for [facility] 1 inhalation twice to pharmacy and encouraged to fill at his convenience.  BREO was discontinued from med list as he is willing to continue with his insurance approved agent.

## 2017-01-24 NOTE — Progress Notes (Signed)
     S:     Chief Complaint  Patient presents with  . Medication Management    diabetes, tobacco abuse   Patient arrives mor ethan 2 hours early for 1:30 PM appointment ambulating without assistance.  Presents for diabetes evaluation, education, and management at the request of Dr. Kennon Rounds. Pt requests refill of Dulera and Novolog prescriptions today. Pt states that he has been smoke-free for 35 days and endorses using mints and chewing on toothpicks to assist with quitting smoking. Pt endorses recently having lisinopril reduced to 1.25mg  twice daily from 2.5mg  twice daily - reports BP has "always been well controlled". Patient reports adherence with medications.  Current diabetes medications include: Novolog 45 units BID, Lantus 50 units BID, Victoza 1.54mcg daily, metformin 1000mg  BID Current hypertension medications include: Lisinopril 1.25mg  BID Current COPD medications include: Dulera 200-5 mcg/act 1 puff into the lungs BID  Patient denies hypoglycemic events.  Patient reported exercise habits:    Patient denies nocturia.  Patient denies neuropathy. Patient denies visual changes. Patient denies self foot exams.   O:  Physical Exam  Constitutional: He appears well-developed and well-nourished.     Review of Systems  All other systems reviewed and are negative.    Lab Results  Component Value Date   HGBA1C 7.8 12/07/2016   Vitals:   01/24/17 1137  BP: 124/70  Resp: (!) 74    Home fasting CBG: 150s  A/P: Diabetes longstanding currently improved control. Patient denies hypoglycemic events and is able to verbalize appropriate hypoglycemia management plan. Patient reports adherence with medication. Control is suboptimal due to sedentary lifestyle.  Encouraged to increase walking from 20 minutes every other day to 25-30 minutes QOD. Continued basal insulin Lantus (insulin glargine) at dose of 50 units twice daily and . Continued rapid insulin Novolog (insulin aspart) at a  dose of 45 units prior to meals twice daily and sent new prescription for Novolog to his pharmacy for refill.  Continued Victoza (liraglutide) at 1.8mg  daily.  Next A1C anticipated at next follow up with PCP 02/2017.  Longstanding tobacco abuse. Reports complete abstinence for ~ 1 month.   Appears motivated to continue with complete abstinence from tobacco.  For his shortness of breath and chronic lung dyspnea, continued [facility] - sent new prescription for [facility] 1 inhalation twice to pharmacy and encouraged to fill at his convenience.  BREO was discontinued from med list as he is willing to continue with his insurance approved agent.   ASCVD risk greater than 7.5%. Continued Aspirin 81 mg and Continued atorvastatin 80 mg.   Hypertension longstanding currently at goal and tolerating low dose lisinopril and carvedilol. Patient reports adherence with medication. No change in therapy at this time.  Written patient instructions provided.  Total time in face to face counseling 20 minutes.   Follow up in Pharmacist Clinic Visit PRN as requested by PCP, Dr. Kennon Rounds.   Patient seen with Jerrye Noble, PharmD Candidate, and Arrie Senate, PharmD PGY-1 Resident.

## 2017-01-24 NOTE — Assessment & Plan Note (Signed)
Diabetes longstanding currently improved control. Patient denies hypoglycemic events and is able to verbalize appropriate hypoglycemia management plan. Patient reports adherence with medication. Control is suboptimal due to sedentary lifestyle.  Encouraged to increase walking from 20 minutes every other day to 25-30 minutes QOD. Continued basal insulin Lantus (insulin glargine) at dose of 50 units twice daily and . Continued rapid insulin Novolog (insulin aspart) at a dose of 45 units prior to meals twice daily and sent new prescription for Novolog to his pharmacy for refill.  Continued Victoza (liraglutide) at 1.8mg  daily.  Next A1C anticipated at next follow up with PCP 02/2017.

## 2017-01-25 ENCOUNTER — Encounter: Payer: Self-pay | Admitting: Podiatry

## 2017-01-25 ENCOUNTER — Ambulatory Visit (INDEPENDENT_AMBULATORY_CARE_PROVIDER_SITE_OTHER): Payer: Medicaid Other | Admitting: Podiatry

## 2017-01-25 DIAGNOSIS — B351 Tinea unguium: Secondary | ICD-10-CM | POA: Diagnosis not present

## 2017-01-25 DIAGNOSIS — M79676 Pain in unspecified toe(s): Secondary | ICD-10-CM

## 2017-01-25 DIAGNOSIS — E119 Type 2 diabetes mellitus without complications: Secondary | ICD-10-CM

## 2017-01-25 NOTE — Progress Notes (Signed)
Patient ID: [name] [name], male   DOB: [birth year], 56 y.o.   MRN: [id number]    Subjective: Patient presents for diabetic yearly foot examination as well as complaining of uncomfortable toenails and requests nail debridement. Patient describes occasional tingling and burning his feet on and off weightbearing is not significantly changed over the past year Patient is diabetic and denies any history of foot ulceration, claudication or amputation  Objective: Orientated 3 DP and PT pulses 2/4 bilaterally Capillary reflex immediate bilaterally  Neurological: Sensation to 10 g monofilament wire intact 5/5 bilaterally Ankle reflex reactive bilaterally Vibratory sensation reactive bilaterally  Dermatological: No open skin lesions bilaterally The toenails are elongatedl discolored, hypertrophic with tenderness to direct palpation  Musculoskeletal: There is no restriction ankle, subtalar, midtarsal joints bilaterally Manual motor testing dorsi flexion, plantar flexion 5/5 bilaterally  Assessment: Diabetic Satisfactory neurovascular status Symptomatic onychomycoses 6-10  Plan: Debridement toenails 6-10 mechanically and electrically without any bleeding  Reappoint 3 months

## 2017-01-25 NOTE — Patient Instructions (Signed)

## 2017-02-07 ENCOUNTER — Telehealth: Payer: Self-pay | Admitting: *Deleted

## 2017-02-07 NOTE — Telephone Encounter (Signed)
Received [place] approval for Dulera 200-5 mcg via [place] [employer].  Med approved for 02/07/17 - 02/07/18.  [place] informed.  PA confirmation number [id number] Teresita Madura, Rosine Beat, RN

## 2017-02-07 NOTE — Telephone Encounter (Signed)
Prior Authorization received from [place] for dulera 200-5 mcg. PA pending per [place] [employer]. Derl Barrow, RN

## 2017-02-13 ENCOUNTER — Other Ambulatory Visit: Payer: Self-pay | Admitting: Family Medicine

## 2017-02-13 DIAGNOSIS — M545 Low back pain, unspecified: Secondary | ICD-10-CM

## 2017-02-13 DIAGNOSIS — I2510 Atherosclerotic heart disease of native coronary artery without angina pectoris: Secondary | ICD-10-CM

## 2017-02-13 DIAGNOSIS — I10 Essential (primary) hypertension: Secondary | ICD-10-CM

## 2017-02-13 DIAGNOSIS — Z9861 Coronary angioplasty status: Secondary | ICD-10-CM

## 2017-02-14 NOTE — Telephone Encounter (Signed)
Script information left on pharmacy voicemail.  LM for patient that scripts were called into the pharmacy. Naren Benally,CMA

## 2017-02-15 ENCOUNTER — Telehealth: Payer: Self-pay | Admitting: Family Medicine

## 2017-02-15 NOTE — Telephone Encounter (Signed)
Pt wants another referral sent to ENT to get a hearing aid put in a few weeks.  It is the same place he went yesterday to have his ears cleaned out.

## 2017-02-15 NOTE — Telephone Encounter (Signed)
LM for patient letting him know that there was recent referral placed in march 2018 and it is active for a year.  He can call the specialist office himself and make an appointment or they can call us to add more visits to his referral if needed.  Bayyinah Dukeman,CMA

## 2017-03-01 ENCOUNTER — Ambulatory Visit (INDEPENDENT_AMBULATORY_CARE_PROVIDER_SITE_OTHER): Payer: Medicaid Other | Admitting: Family Medicine

## 2017-03-01 ENCOUNTER — Telehealth: Payer: Self-pay | Admitting: Family Medicine

## 2017-03-01 ENCOUNTER — Encounter: Payer: Self-pay | Admitting: Family Medicine

## 2017-03-01 VITALS — BP 134/62 | HR 76 | Temp 98.5°F | Ht 67.0 in | Wt 283.0 lb

## 2017-03-01 DIAGNOSIS — Z794 Long term (current) use of insulin: Secondary | ICD-10-CM | POA: Diagnosis not present

## 2017-03-01 DIAGNOSIS — Z23 Encounter for immunization: Secondary | ICD-10-CM

## 2017-03-01 DIAGNOSIS — J449 Chronic obstructive pulmonary disease, unspecified: Secondary | ICD-10-CM | POA: Diagnosis not present

## 2017-03-01 DIAGNOSIS — E1159 Type 2 diabetes mellitus with other circulatory complications: Secondary | ICD-10-CM

## 2017-03-01 LAB — POCT GLYCOSYLATED HEMOGLOBIN (HGB A1C): Hemoglobin A1C: 8.3

## 2017-03-01 MED ORDER — INSULIN ASPART 100 UNIT/ML ~~LOC~~ SOLN: SUBCUTANEOUS | 5 refills | Status: DC

## 2017-03-01 MED ORDER — INSULIN GLARGINE 100 UNIT/ML ~~LOC~~ SOLN: SUBCUTANEOUS | 0 refills | Status: DC

## 2017-03-01 MED ORDER — FLUTICASONE FUROATE-VILANTEROL 200-25 MCG/INH IN AEPB: 1.0000 | INHALATION_SPRAY | Freq: Every day | RESPIRATORY_TRACT | 0 refills | Status: AC

## 2017-03-01 MED ORDER — MOMETASONE FURO-FORMOTEROL FUM 200-5 MCG/ACT IN AERO: 1.0000 | INHALATION_SPRAY | Freq: Two times a day (BID) | RESPIRATORY_TRACT | 11 refills | Status: DC

## 2017-03-01 MED ORDER — INSULIN ASPART 100 UNIT/ML ~~LOC~~ SOLN: 45.0000 [IU] | Freq: Two times a day (BID) | SUBCUTANEOUS | 99 refills | Status: DC

## 2017-03-01 NOTE — Telephone Encounter (Signed)
Return in about 3 months (around 06/01/2017).  Patient stated at check out he will have to back later to schedule due to other appts.

## 2017-03-01 NOTE — Assessment & Plan Note (Signed)
Sample of Breo--refilled [facility]

## 2017-03-01 NOTE — Progress Notes (Signed)
   Subjective:    Patient ID: [name] [name] is a 56 y.o. male presenting with Diabetes and Medication Refill  on 03/01/2017  HPI: Doing well. Back to some light smoking He needs meds refill Working on seeing audiology for hearing aids.  Review of Systems  Constitutional: Negative for chills and fever.  Respiratory: Negative for shortness of breath.   Cardiovascular: Negative for leg swelling.  Gastrointestinal: Negative for abdominal pain, nausea and vomiting.      Objective:    BP 134/62   Pulse 76   Temp 98.5 F (36.9 C) (Oral)   Ht 5\' 7"  (1.702 m)   Wt 283 lb (128.4 kg)   SpO2 95%   BMI 44.32 kg/m  Physical Exam  Constitutional: He appears well-developed and well-nourished. No distress.  HENT:  Head: Normocephalic and atraumatic.  Eyes: No scleral icterus.  Neck: Neck supple.  Cardiovascular: Normal rate.   Pulmonary/Chest: Effort normal.  Abdominal: Soft.  Musculoskeletal: He exhibits no edema.  Neurological: He is alert.  Skin: Skin is warm.  Psychiatric: He has a normal mood and affect.  Vitals reviewed.       Assessment & Plan:   Problem List Items Addressed This Visit      Medium   DM type 2 (diabetes mellitus, type 2) ([place]) - Primary    Continue to work on diet and exercise--increase lantus to 60 units daily.      Relevant Medications   insulin aspart (NOVOLOG) 100 UNIT/ML injection   insulin glargine (LANTUS) 100 UNIT/ML injection   insulin aspart (NOVOLOG) 100 UNIT/ML injection   Other Relevant Orders   HgB A1c (Completed)   Pneumococcal polysaccharide vaccine 23-valent greater than or equal to 2yo subcutaneous/IM (Completed)     Unprioritized   COPD, moderate (HCC)    Sample of Breo--refilled Dulera      Relevant Medications   mometasone-formoterol (DULERA) 200-5 MCG/ACT AERO   fluticasone furoate-vilanterol (BREO ELLIPTA) 200-25 MCG/INH AEPB   Other Relevant Orders   Pneumococcal polysaccharide vaccine 23-valent greater than or equal  to 2yo subcutaneous/IM (Completed)      Total face-to-face time with patient: 15 minutes. Over 50% of encounter was spent on counseling and coordination of care. Return in about 3 months (around 06/01/2017).  [name] 03/01/2017 5:19 PM

## 2017-03-01 NOTE — Assessment & Plan Note (Signed)
Continue to work on diet and exercise--increase lantus to 60 units daily.

## 2017-03-01 NOTE — Patient Instructions (Signed)
Bupropion sustained-release tablets (smoking cessation)  What is this medicine?  BUPROPION (byoo PROE pee on) is used to help people quit smoking.  This medicine may be used for other purposes; ask your health care provider or pharmacist if you have questions.  COMMON BRAND NAME(S): Buproban, Zyban  What should I tell my health care provider before I take this medicine?  They need to know if you have any of these conditions:  -an eating disorder, such as anorexia or bulimia  -bipolar disorder or psychosis  -diabetes or high blood sugar, treated with medication  -glaucoma  -head injury or brain tumor  -heart disease, previous heart attack, or irregular heart beat  -high blood pressure  -kidney or liver disease  -seizures  -suicidal thoughts or a previous suicide attempt  -Tourette's syndrome  -weight loss  -an unusual or allergic reaction to bupropion, other medicines, foods, dyes, or preservatives  -breast-feeding  -pregnant or trying to become pregnant  How should I use this medicine?  Take this medicine by mouth with a glass of water. Follow the directions on the prescription label. You can take it with or without food. If it upsets your stomach, take it with food. Do not cut, crush or chew this medicine. Take your medicine at regular intervals. If you take this medicine more than once a day, take your second dose at least 8 hours after you take your first dose. To limit difficulty in sleeping, avoid taking this medicine at bedtime. Do not take your medicine more often than directed. Do not stop taking this medicine suddenly except upon the advice of your doctor. Stopping this medicine too quickly may cause serious side effects.  A special MedGuide will be given to you by the pharmacist with each prescription and refill. Be sure to read this information carefully each time.  Talk to your pediatrician regarding the use of this medicine in children. Special care may be needed.  Overdosage: If you think you have  taken too much of this medicine contact a poison control center or emergency room at once.  NOTE: This medicine is only for you. Do not share this medicine with others.  What if I miss a dose?  If you miss a dose, skip the missed dose and take your next tablet at the regular time. There should be at least 8 hours between doses. Do not take double or extra doses.  What may interact with this medicine?  Do not take this medicine with any of the following medications:  -linezolid  -MAOIs like Azilect, Carbex, Eldepryl, Marplan, Nardil, and Parnate  -methylene blue (injected into a vein)  -other medicines that contain bupropion like Wellbutrin  This medicine may also interact with the following medications:  -alcohol  -certain medicines for anxiety or sleep  -certain medicines for blood pressure like metoprolol, propranolol  -certain medicines for depression or psychotic disturbances  -certain medicines for HIV or AIDS like efavirenz, lopinavir, nelfinavir, ritonavir  -certain medicines for irregular heart beat like propafenone, flecainide  -certain medicines for Parkinson's disease like amantadine, levodopa  -certain medicines for seizures like carbamazepine, phenytoin, phenobarbital  -cimetidine  -clopidogrel  -cyclophosphamide  -digoxin  -furazolidone  -isoniazid  -nicotine  -orphenadrine  -procarbazine  -steroid medicines like prednisone or cortisone  -stimulant medicines for attention disorders, weight loss, or to stay awake  -tamoxifen  -theophylline  -thiotepa  -ticlopidine  -tramadol  -warfarin  This list may not describe all possible interactions. Give your health care provider a list   patient support program. It is important to participate in a behavioral program, counseling, or other support program that is recommended by your health care professional. Patients and their families should watch out for new or worsening thoughts of suicide or depression. Also watch out for sudden changes in feelings such as feeling anxious, agitated, panicky, irritable, hostile, aggressive, impulsive, severely restless, overly excited and hyperactive, or not being able to sleep. If this happens, especially at the beginning of treatment or after a change in dose, call your health care professional. Avoid alcoholic drinks while taking this medicine. Drinking excessive alcoholic beverages, using sleeping or anxiety medicines, or quickly stopping the use of these agents while taking this medicine may increase your risk for a seizure. Do not drive or use heavy machinery until you know how this medicine affects you. This medicine can impair your ability to perform these tasks. Do not take this medicine close to bedtime. It may prevent you from sleeping. Your mouth may get dry. Chewing sugarless gum or sucking hard candy, and drinking plenty of water may help. Contact your doctor if the problem does not go away or is severe. Do not use nicotine patches or chewing gum without the advice of your doctor or health care professional while taking this medicine. You may need to have your blood pressure taken regularly if your doctor recommends that you use both nicotine and this medicine together. What side effects may I notice from receiving this medicine? Side effects that you should report to your doctor or health care professional as soon as possible: -allergic reactions like skin rash, itching or hives, swelling of the face, lips, or tongue -breathing problems -changes in vision -confusion -elevated mood, decreased need for sleep, racing thoughts, impulsive  behavior -fast or irregular heartbeat -hallucinations, loss of contact with reality -increased blood pressure -redness, blistering, peeling or loosening of the skin, including inside the mouth -seizures -suicidal thoughts or other mood changes -unusually weak or tired -vomiting Side effects that usually do not require medical attention (report to your doctor or health care professional if they continue or are bothersome): -constipation -headache -loss of appetite -nausea -tremors -weight loss This list may not describe all possible side effects. Call your doctor for medical advice about side effects. You may report side effects to FDA at 1-800-FDA-1088. Where should I keep my medicine? Keep out of the reach of children. Store at room temperature between 20 and 25 degrees C (68 and 77 degrees F). Protect from light. Keep container tightly closed. Throw away any unused medicine after the expiration date. NOTE: This sheet is a summary. It may not cover all possible information. If you have questions about this medicine, talk to your doctor, pharmacist, or health care provider.  2018 Elsevier/Gold Standard (2016-02-19 13:49:28)  

## 2017-03-02 ENCOUNTER — Telehealth: Payer: Self-pay | Admitting: *Deleted

## 2017-03-02 ENCOUNTER — Other Ambulatory Visit: Payer: Self-pay | Admitting: Family Medicine

## 2017-03-02 DIAGNOSIS — J449 Chronic obstructive pulmonary disease, unspecified: Secondary | ICD-10-CM

## 2017-03-02 MED ORDER — MOMETASONE FURO-FORMOTEROL FUM 200-5 MCG/ACT IN AERO: 2.0000 | INHALATION_SPRAY | Freq: Two times a day (BID) | RESPIRATORY_TRACT | 11 refills | Status: DC

## 2017-03-02 NOTE — Telephone Encounter (Signed)
Received fax from [facility] needing clarification on the direction of Dulera.  Pt was using 2 puffs BID and new Rx states 1 puff BID.  Please advise.  Derl Barrow, RN

## 2017-03-03 ENCOUNTER — Other Ambulatory Visit: Payer: Self-pay | Admitting: *Deleted

## 2017-03-03 DIAGNOSIS — E1159 Type 2 diabetes mellitus with other circulatory complications: Secondary | ICD-10-CM

## 2017-03-03 DIAGNOSIS — Z794 Long term (current) use of insulin: Principal | ICD-10-CM

## 2017-03-03 MED ORDER — INSULIN GLARGINE 100 UNIT/ML ~~LOC~~ SOLN: SUBCUTANEOUS | 0 refills | Status: DC

## 2017-03-13 ENCOUNTER — Other Ambulatory Visit: Payer: Self-pay | Admitting: *Deleted

## 2017-03-13 DIAGNOSIS — E1159 Type 2 diabetes mellitus with other circulatory complications: Secondary | ICD-10-CM

## 2017-03-13 DIAGNOSIS — Z794 Long term (current) use of insulin: Principal | ICD-10-CM

## 2017-03-13 MED ORDER — INSULIN GLARGINE 100 UNIT/ML ~~LOC~~ SOLN: SUBCUTANEOUS | 0 refills | Status: DC

## 2017-03-22 ENCOUNTER — Other Ambulatory Visit: Payer: Self-pay | Admitting: Family Medicine

## 2017-03-22 DIAGNOSIS — I214 Non-ST elevation (NSTEMI) myocardial infarction: Secondary | ICD-10-CM

## 2017-03-22 MED ORDER — INSULIN GLARGINE 100 UNIT/ML ~~LOC~~ SOLN: SUBCUTANEOUS | 1 refills | Status: DC

## 2017-03-22 NOTE — Addendum Note (Signed)
Addended by: Derl Barrow on: 03/22/2017 12:08 PM   Modules accepted: Orders

## 2017-03-29 NOTE — Progress Notes (Signed)
04/03/2017 [name] [name]   November 19, 1960  [id number]  Primary Physician [name], MD Primary Cardiologist: [name]  HPI:  [name] [name] is seen today for follow up CAD. His past medical history is significant for CAD, DM, obesity, tobacco use , hyperlipidemia and hypertension. He is status post stenting of the right coronary artery in May of 2013. He was admitted with a NSTEMI inFebruary of 2015. He was found to have severe disease in LCx and OM2 treated with BMS.  He was placed on dual antiplatelet therapy with aspirin plus Effient.   He presented with increased angina in October 2015. A Myoview study was intermediate risk.  He had repeat cardiac cath in November 2015.  He was found to have severe single-vessel disease involving the bifurcation of OM1 and OM 2 with 99% in-stent restenosis in the bare-metal stent placed in the OM 2. He underwent successful PCI spanning the proximal circumflex stent across OM 1 through the bare-metal stent placed in OM 2 with a Xience alpine DES stent. He was also noted to have a widely patent stent in the RCA and proximal circumflex with otherwise mild-moderate disease. He has preserved LVEF with an ejection fraction of 55-65%. He was instructed to continue with DAPT with ASA + Effient.   He was seen in the ED in January with nasal fracture after he tripped and fell on his face. Seen in ED in February with atypical chest pain.   On follow up today he reports he is doing  well. No chest pain or SOB. He states he did quit smoking for a while but still smokes some now.  Eats some fast food because people give it to him.  Last A1c 8.3%. Weight is stable.     Current Outpatient Prescriptions  Medication Sig Dispense Refill  . ACCU-CHEK FASTCLIX LANCETS MISC 1 Units by Percutaneous route 4 (four) times daily. 100 each 12  . albuterol (PROVENTIL HFA;VENTOLIN HFA) 108 (90 Base) MCG/ACT inhaler Inhale 2 puffs into the lungs every 6 (six) hours as needed for wheezing or  shortness of breath. 1 Inhaler 0  . aspirin 81 MG chewable tablet Chew 1 tablet (81 mg total) by mouth daily. 180 tablet 2  . atorvastatin (LIPITOR) 80 MG tablet TAKE 1 TABLET (80 MG TOTAL) BY MOUTH DAILY. (Patient taking differently: TAKE 1 TABLET (80 MG TOTAL) BY MOUTH EVERY EVENING) 90 tablet 4  . B-D INS SYR ULTRAFINE 1CC/31G 31G X 5/16" 1 ML MISC INJECT 5 TIMES DAILY AS DIRECTED. 100 each 2  . Blood Glucose Monitoring Suppl (ACCU-CHEK AVIVA PLUS) w/Device KIT Check fasting, and two hours after breakfast, lunch and dinner. ICD-10 code E11.9. 1 kit 0  . Blood Glucose Monitoring Suppl (ACCU-CHEK NANO SMARTVIEW) w/Device KIT 1 Units by Does not apply route as directed. Check fasting, and two hours after breakfast, lunch and dinner. ICD-10 code E11.9. 1 kit 0  . carvedilol (COREG) 3.125 MG tablet TAKE 1 TABLET BY MOUTH TWO TIMES A DAY WITH A MEAL 60 tablet 1  . citalopram (CELEXA) 20 MG tablet Take 1 tablet (20 mg total) by mouth daily. (Patient taking differently: Take 20 mg by mouth every evening. ) 30 tablet 12  . glucose blood (ACCU-CHEK AVIVA) test strip Use test blood sugar 4 times a day. ICD-10 code: E11.9 100 each 12  . insulin aspart (NOVOLOG) 100 UNIT/ML injection INJECT 45 UNITS INTO THE SKIN TWICE DAILY WITH MEALS 20 mL 5  . insulin glargine (LANTUS) 100 UNIT/ML  injection INJECT 0.60 MILLILITERS (60 UNITS TOTAL) INTO THE SKIN TWICE A DAY 2 vial 1  . Insulin Pen Needle (BD ULTRA-FINE PEN NEEDLES) 29G X 12.7MM MISC Use to with Victoza Pen, injection once daily. 100 each 11  . Lancets (ACCU-CHEK SOFT TOUCH) lancets 1 each by Other route 4 (four) times daily. Use as instructed 100 each 12  . Liraglutide 18 MG/3ML SOPN Inject 0.3 mLs (1.8 mg total) into the skin daily. Dispense QS for 1 month supply 3 pen 11  . lisinopril (PRINIVIL,ZESTRIL) 2.5 MG tablet Take 1 tablet (2.5 mg total) by mouth 2 (two) times daily. (Patient taking differently: Take 1.25 mg by mouth 2 (two) times daily. ) 180 tablet  3  . metFORMIN (GLUCOPHAGE) 1000 MG tablet Take 1 tablet (1,000 mg total) by mouth 2 (two) times daily with a meal. 60 tablet 11  . mometasone-formoterol (DULERA) 200-5 MCG/ACT AERO Inhale 2 puffs into the lungs 2 (two) times daily. 1 Inhaler 11  . Multiple Vitamin (MULTIVITAMIN) tablet Take 1 tablet by mouth daily.    . nitroGLYCERIN (NITROSTAT) 0.4 MG SL tablet PLACE 1 TABLET UNDER THE TONGUE EVERY 5 MINUTES FOR 3 DOSES AS NEEDED FOR CHEST PAIN *IF NO RELIEF, CALL 911* 100 tablet 2  . NITROSTAT 0.4 MG SL tablet PLACE ONE TABLET UNDER THE TONGUE EVERY 5 MINUTES FOR 3 DOSES AS NEEDED FOR CHEST PAIN 100 tablet 2  . Omega-3 Fatty Acids (FISH OIL PO) Take 1,000 mg by mouth 2 (two) times daily.     . prasugrel (EFFIENT) 10 MG TABS tablet Take 1 tablet (10 mg total) by mouth daily. 90 tablet 3  . traMADol (ULTRAM) 50 MG tablet TAKE ONE TABLET BY MOUTH EVERY 6 HOURS AS NEEDED 20 tablet 1   No current facility-administered medications for this visit.     Allergies  Allergen Reactions  . Shellfish Allergy Nausea And Vomiting    OYSTERS    Social History   Social History  . Marital status: Single    Spouse name: N/A  . Number of children: 0  . Years of education: 15   Occupational History  . Unemployed     Used to work for [employer]  . Student, computer tech [employer]    Social History Main Topics  . Smoking status: Light Tobacco Smoker    Packs/day: 1.00    Years: 37.00    Types: Cigarettes    Last attempt to quit: 12/17/2016  . Smokeless tobacco: Never Used     Comment: relapsed briefly in early 2018 - Quit in 4/18  . Alcohol use 3.6 oz/week    6 Cans of beer per week     Comment: pt reports "I quit 66 days ago" (05/09/16)  . Drug use: No     Comment: Crack Cocaine (denies 05/09/16)  . Sexual activity: Not Currently   Other Topics Concern  . Not on file   Social History Narrative   Single.  Lives with a roommate.  Ambulates independently.     Review of Systems: AS  noted in HPI All other systems reviewed and are otherwise negative except as noted above.  Blood pressure 125/74, pulse 88, height 5\' 7"  (1.702 m), weight 285 lb 9.6 oz (129.5 kg), SpO2 93 %.  General appearance: alert, obese, cooperative and no distress Neck: no carotid bruit and no JVD Lungs: clear to auscultation bilaterally Heart: regular rate and rhythm, S1, S2 normal, no murmur, click, rub or gallop Extremities: no LEE  Pulses: 2+ and symmetric Skin: warm and dry Neurologic: Grossly normal  Laboratory data:  Lab Results  Component Value Date   WBC 10.7 (H) 11/07/2016   HGB 12.3 (L) 11/07/2016   HCT 36.8 (L) 11/07/2016   PLT 243 11/07/2016   GLUCOSE 188 (H) 11/07/2016   CHOL 154 03/29/2016   TRIG 264 (H) 03/29/2016   HDL 42 03/29/2016   LDLCALC 59 03/29/2016   ALT 21 03/29/2016   AST 16 03/29/2016   NA 138 11/07/2016   K 4.1 11/07/2016   CL 108 11/07/2016   CREATININE 0.64 11/07/2016   BUN 11 11/07/2016   CO2 24 11/07/2016   TSH 1.100 01/18/2012   INR 0.96 07/28/2014   HGBA1C 8.3 03/01/2017   MICROALBUR 4.7 05/23/2016     ASSESSMENT AND PLAN:   1. CAD: Status post repeat PCI to the proximal circumflex and OM 2 in Nov. 2015 as outlined above with DES for instent restenosis. He denies any recurrent anginal symptoms.  Continue dual antiplatelet therapy with aspirin plus Effient indefinitely due to extensive nature of stents.  Continue beta blocker, statin and ACE inhibitor for secondary prevention.  2. Hypertension: Blood pressure is well-controlled. Continue lisinopril and Coreg  3. Hyperlipidemia: on statin. Lipids last checked one year ago. Will update labs today.  4. Diabetes: per primary care.   5. Tobacco abuse: recommend complete smoking cessation.  I will follow up in 6 months.  Keiondre Colee [place] MD,FACC  04/03/2017 2:44 PM

## 2017-04-03 ENCOUNTER — Ambulatory Visit (INDEPENDENT_AMBULATORY_CARE_PROVIDER_SITE_OTHER): Payer: Medicaid Other | Admitting: Cardiology

## 2017-04-03 ENCOUNTER — Encounter: Payer: Self-pay | Admitting: Cardiology

## 2017-04-03 ENCOUNTER — Other Ambulatory Visit: Payer: Self-pay

## 2017-04-03 VITALS — BP 125/74 | HR 88 | Ht 67.0 in | Wt 285.6 lb

## 2017-04-03 DIAGNOSIS — I2510 Atherosclerotic heart disease of native coronary artery without angina pectoris: Secondary | ICD-10-CM

## 2017-04-03 DIAGNOSIS — Z9861 Coronary angioplasty status: Secondary | ICD-10-CM | POA: Diagnosis not present

## 2017-04-03 DIAGNOSIS — E1159 Type 2 diabetes mellitus with other circulatory complications: Secondary | ICD-10-CM

## 2017-04-03 DIAGNOSIS — I10 Essential (primary) hypertension: Secondary | ICD-10-CM

## 2017-04-03 DIAGNOSIS — E785 Hyperlipidemia, unspecified: Secondary | ICD-10-CM

## 2017-04-03 DIAGNOSIS — Z794 Long term (current) use of insulin: Secondary | ICD-10-CM

## 2017-04-03 MED ORDER — PRASUGREL HCL 10 MG PO TABS: 10.0000 mg | ORAL_TABLET | Freq: Every day | ORAL | 3 refills | Status: DC

## 2017-04-03 NOTE — Patient Instructions (Signed)
Continue your current therapy  Eat a heart healthy diet and avoid sweets.  Stop smoking  We will check blood work today  I will see you in 6 months.

## 2017-04-04 LAB — BASIC METABOLIC PANEL
BUN / CREAT RATIO: 13 (ref 9–20)
BUN: 10 mg/dL (ref 6–24)
CHLORIDE: 99 mmol/L (ref 96–106)
CO2: 26 mmol/L (ref 20–29)
Calcium: 9.8 mg/dL (ref 8.7–10.2)
Creatinine, Ser: 0.79 mg/dL (ref 0.76–1.27)
GFR calc non Af Amer: 101 mL/min/{1.73_m2} (ref >59)
GFR, EST AFRICAN AMERICAN: 117 mL/min/{1.73_m2} (ref >59)
GLUCOSE: 105 mg/dL — AB (ref 65–99)
Potassium: 4.5 mmol/L (ref 3.5–5.2)
SODIUM: 140 mmol/L (ref 134–144)

## 2017-04-04 LAB — LIPID PANEL
CHOLESTEROL TOTAL: 117 mg/dL (ref 100–199)
Chol/HDL Ratio: 3.5 ratio (ref 0.0–5.0)
HDL: 33 mg/dL — ABNORMAL LOW (ref >39)
LDL CALC: 47 mg/dL (ref 0–99)
Triglycerides: 187 mg/dL — ABNORMAL HIGH (ref 0–149)
VLDL CHOLESTEROL CAL: 37 mg/dL (ref 5–40)

## 2017-04-04 LAB — HEPATIC FUNCTION PANEL
ALBUMIN: 4.3 g/dL (ref 3.5–5.5)
ALK PHOS: 133 IU/L — AB (ref 39–117)
ALT: 14 IU/L (ref 0–44)
AST: 19 IU/L (ref 0–40)
BILIRUBIN TOTAL: 0.4 mg/dL (ref 0.0–1.2)
Bilirubin, Direct: 0.14 mg/dL (ref 0.00–0.40)
TOTAL PROTEIN: 7.1 g/dL (ref 6.0–8.5)

## 2017-04-28 ENCOUNTER — Ambulatory Visit: Payer: Medicaid Other | Admitting: Pharmacist

## 2017-05-03 ENCOUNTER — Ambulatory Visit: Payer: Medicaid Other | Admitting: Podiatry

## 2017-05-04 ENCOUNTER — Other Ambulatory Visit: Payer: Self-pay | Admitting: Family Medicine

## 2017-05-11 ENCOUNTER — Encounter: Payer: Self-pay | Admitting: Pharmacist

## 2017-05-11 ENCOUNTER — Ambulatory Visit (INDEPENDENT_AMBULATORY_CARE_PROVIDER_SITE_OTHER): Payer: Medicaid Other | Admitting: Pharmacist

## 2017-05-11 DIAGNOSIS — E1159 Type 2 diabetes mellitus with other circulatory complications: Secondary | ICD-10-CM

## 2017-05-11 DIAGNOSIS — Z720 Tobacco use: Secondary | ICD-10-CM

## 2017-05-11 DIAGNOSIS — J449 Chronic obstructive pulmonary disease, unspecified: Secondary | ICD-10-CM | POA: Diagnosis not present

## 2017-05-11 DIAGNOSIS — Z794 Long term (current) use of insulin: Secondary | ICD-10-CM

## 2017-05-11 MED ORDER — ALBUTEROL SULFATE HFA 108 (90 BASE) MCG/ACT IN AERS: 2.0000 | INHALATION_SPRAY | Freq: Four times a day (QID) | RESPIRATORY_TRACT | 3 refills | Status: DC | PRN

## 2017-05-11 MED ORDER — INSULIN GLARGINE 100 UNIT/ML ~~LOC~~ SOLN: 55.0000 [IU] | Freq: Two times a day (BID) | SUBCUTANEOUS | 1 refills | Status: DC

## 2017-05-11 MED ORDER — INSULIN ASPART 100 UNIT/ML ~~LOC~~ SOLN: 50.0000 [IU] | Freq: Two times a day (BID) | SUBCUTANEOUS | 5 refills | Status: DC

## 2017-05-11 MED ORDER — MOMETASONE FURO-FORMOTEROL FUM 200-5 MCG/ACT IN AERO: 2.0000 | INHALATION_SPRAY | Freq: Two times a day (BID) | RESPIRATORY_TRACT | 11 refills | Status: DC

## 2017-05-11 MED ORDER — INSULIN ASPART 100 UNIT/ML ~~LOC~~ SOLN: 50.0000 [IU] | Freq: Two times a day (BID) | SUBCUTANEOUS | 0 refills | Status: DC

## 2017-05-11 MED ORDER — FLUTICASONE FUROATE-VILANTEROL 200-25 MCG/INH IN AEPB: 1.0000 | INHALATION_SPRAY | Freq: Every day | RESPIRATORY_TRACT | 0 refills | Status: DC

## 2017-05-11 NOTE — Assessment & Plan Note (Signed)
COPD/shortness of breath longstanding is currently uncontrolled with continued symptoms. Patient has been using Dulera up to 4 times daily and no albuterol. Encouraged to pursue smoking cessation. Due to extensive cardiac history, Chantix may be inappropriate. Patient advised to reduce daily number of cigarettes before pursuing medication therapy as he only smokes 4-6 per day. Ruthe Mannan 200/5 sent to pharmacy. Counseled on controller medication, to be taken twice daily regardless of symptoms. - albuterol inhaler sent to pharmacy. Counseled to use albuterol for breakthrough symptoms of shortness of breath. - given sample of Breo Ellipta 200/25 to take until able to pick up [facility]. Counseled on once daily dosing

## 2017-05-11 NOTE — Assessment & Plan Note (Signed)
Diabetes longstanding currently uncontrolled with A1c 8.3 and home FBG sometimes in 200s. Patient reports hypoglycemic events and is able to verbalize appropriate hypoglycemia management plan. Patient reports adherence with medication. Control is suboptimal due to supoptimal dosing of medications and dietary indiscretions. - Decrease Lantus to 55 units BID. New rx sent to pharmacy - Increase Novolog to 50 units BID before meals. Given sample and new Rx sent to pharmacy. - Patient counseled to increase exercise by walking around indoors with air conditioning

## 2017-05-11 NOTE — Patient Instructions (Addendum)
Thank you for coming in today!  1. Decrease your Lantus to 55 units injected twice daily in the morning and evening.  2. Increase your Novolog to 50 units injected twice a day with your meals.  3. Use the Breo Ellipta inhaler once daily. When you run out, start using the [facility] inhaler only twice daily, in the morning and evening.  4. Use your albuterol inhaler for breakthrough shortness of breath or wheezing.   Please return to [facility] in 1 month.

## 2017-05-11 NOTE — Progress Notes (Signed)
    S:     Chief Complaint  Patient presents with  . Medication Management    diabetes    Patient arrives in good spirits, ambulating fairly well without assistance.  Presents for diabetes evaluation, education, and management at the request of Dr. Kennon Rounds. Patient was referred on 03/01/17.  Patient was last seen by Primary Care Provider on 03/01/17.    Patient reports blood sugars of 76 to 200s in the morning, he did not bring his meter. He reports smoking 4-6 cigarettes per day, has previously quit for 108 days. He asked about Chantix. He reports interest in quitting smoking. He reports using Dulera up to 4 times a day when it is hot for symptoms of shortness of breath. He denies using albuterol for at least a year. He requests refill of Novolog, [facility] [place] today.  Patient reports Diabetes was diagnosed in 2013  Patient reports adherence with medications.  Current diabetes medications include: lantus 60 BID, novolog 45 BID, metformin 1000 BID, victoza 1.8 mg once daily Current hypertension medications include: carvedilol 3.125 mg BID, lisinopril 2.5 mg daily  Patient denies hypoglycemic events.  Patient reported dietary habits: Eats 2 meals/day Drinks: mostly water, diet pepsi, 2% milk, regular pepsi once a week  Patient reported exercise habits: walks once a week, finds it hard to get out in the heat    Patient reports nocturia 1 time per night.  O:  Physical Exam  Constitutional: He appears well-developed and well-nourished.  Vitals reviewed.  Review of Systems  All other systems reviewed and are negative.    Lab Results  Component Value Date   HGBA1C 8.3 03/01/2017   Vitals:   05/11/17 1414  BP: (!) 115/58  Pulse: 69    Home fasting CBG: 76 - 200s   A/P: Diabetes longstanding currently uncontrolled with A1c 8.3 and home FBG sometimes in 200s. Patient reports hypoglycemic events and is able to verbalize appropriate hypoglycemia management plan. Patient reports  adherence with medication. Control is suboptimal due to supoptimal dosing of medications and dietary indiscretions. - Decrease Lantus to 55 units BID. New rx sent to pharmacy - Increase Novolog to 50 units BID before meals. Given sample and new Rx sent to pharmacy. - Patient counseled to increase exercise by walking around indoors with air conditioning  COPD/shortness of breath longstanding is currently uncontrolled with continued symptoms. Patient has been using Dulera up to 4 times daily and no albuterol. Encouraged to pursue smoking cessation. Due to extensive cardiac history, Chantix may be inappropriate. Patient advised to reduce daily number of cigarettes before pursuing medication therapy as he only smokes 4-6 per day. Ruthe Mannan 200/5 sent to pharmacy. Counseled on controller medication, to be taken twice daily regardless of symptoms. - albuterol inhaler sent to pharmacy. Counseled to use albuterol for breakthrough symptoms of shortness of breath. - given sample of Breo Ellipta 200/25 to take until able to pick up [facility]. Counseled on once daily dosing   Hypertension longstanding currently controlled. Patient reports adherence with medication, except takes carvedilol once a day. No medication changes today, counseled to take carvedilol twice a day.  Written patient instructions provided.  Total time in face to face counseling 45 minutes.   Follow up in Pharmacist Clinic Visit in 1 month.   Patient seen with Waverly Ferrari, PharmD Candidate, and Charlene Brooke, PharmD PGY-1 Resident.

## 2017-05-12 NOTE — Progress Notes (Addendum)
Patient ID: [name] [name], male   DOB: 08/25/1961, 56 y.o.   MRN: [id number] Reviewed: Agree with Dr. Graylin Shiver documentation and management.  Prescriptions RESENT to pharmacy 05/17/2017

## 2017-05-17 MED ORDER — INSULIN ASPART 100 UNIT/ML ~~LOC~~ SOLN: 50.0000 [IU] | Freq: Two times a day (BID) | SUBCUTANEOUS | 1 refills | Status: DC

## 2017-05-17 MED ORDER — ALBUTEROL SULFATE HFA 108 (90 BASE) MCG/ACT IN AERS: 2.0000 | INHALATION_SPRAY | Freq: Four times a day (QID) | RESPIRATORY_TRACT | 3 refills | Status: DC | PRN

## 2017-05-17 MED ORDER — MOMETASONE FURO-FORMOTEROL FUM 200-5 MCG/ACT IN AERO: 2.0000 | INHALATION_SPRAY | Freq: Two times a day (BID) | RESPIRATORY_TRACT | 11 refills | Status: DC

## 2017-05-17 MED ORDER — INSULIN GLARGINE 100 UNIT/ML ~~LOC~~ SOLN: 55.0000 [IU] | Freq: Two times a day (BID) | SUBCUTANEOUS | 1 refills | Status: DC

## 2017-05-17 NOTE — Addendum Note (Signed)
Addended by: Leavy Cella on: 05/17/2017 04:48 PM   Modules accepted: Orders

## 2017-06-05 ENCOUNTER — Encounter: Payer: Self-pay | Admitting: Podiatry

## 2017-06-05 ENCOUNTER — Ambulatory Visit (INDEPENDENT_AMBULATORY_CARE_PROVIDER_SITE_OTHER): Payer: Medicaid Other | Admitting: Podiatry

## 2017-06-05 DIAGNOSIS — E119 Type 2 diabetes mellitus without complications: Secondary | ICD-10-CM | POA: Diagnosis not present

## 2017-06-05 DIAGNOSIS — B351 Tinea unguium: Secondary | ICD-10-CM | POA: Diagnosis not present

## 2017-06-05 NOTE — Patient Instructions (Signed)
Rub clotrimazole 1% cream between the fourth and fifth left toes open space webspace, 2 times a day 2 weeks or until the skin looks normal  Diabetes and Foot Care Diabetes may cause you to have problems because of poor blood supply (circulation) to your feet and legs. This may cause the skin on your feet to become thinner, break easier, and heal more slowly. Your skin may become dry, and the skin may peel and crack. You may also have nerve damage in your legs and feet causing decreased feeling in them. You may not notice minor injuries to your feet that could lead to infections or more serious problems. Taking care of your feet is one of the most important things you can do for yourself. Follow these instructions at home:  Wear shoes at all times, even in the house. Do not go barefoot. Bare feet are easily injured.  Check your feet daily for blisters, cuts, and redness. If you cannot see the bottom of your feet, use a mirror or ask someone for help.  Wash your feet with warm water (do not use hot water) and mild soap. Then pat your feet and the areas between your toes until they are completely dry. Do not soak your feet as this can dry your skin.  Apply a moisturizing lotion or petroleum jelly (that does not contain alcohol and is unscented) to the skin on your feet and to dry, brittle toenails. Do not apply lotion between your toes.  Trim your toenails straight across. Do not dig under them or around the cuticle. File the edges of your nails with an emery board or nail file.  Do not cut corns or calluses or try to remove them with medicine.  Wear clean socks or stockings every day. Make sure they are not too tight. Do not wear knee-high stockings since they may decrease blood flow to your legs.  Wear shoes that fit properly and have enough cushioning. To break in new shoes, wear them for just a few hours a day. This prevents you from injuring your feet. Always look in your shoes before you put  them on to be sure there are no objects inside.  Do not cross your legs. This may decrease the blood flow to your feet.  If you find a minor scrape, cut, or break in the skin on your feet, keep it and the skin around it clean and dry. These areas may be cleansed with mild soap and water. Do not cleanse the area with peroxide, alcohol, or iodine.  When you remove an adhesive bandage, be sure not to damage the skin around it.  If you have a wound, look at it several times a day to make sure it is healing.  Do not use heating pads or hot water bottles. They may burn your skin. If you have lost feeling in your feet or legs, you may not know it is happening until it is too late.  Make sure your health care provider performs a complete foot exam at least annually or more often if you have foot problems. Report any cuts, sores, or bruises to your health care provider immediately. Contact a health care provider if:  You have an injury that is not healing.  You have cuts or breaks in the skin.  You have an ingrown nail.  You notice redness on your legs or feet.  You feel burning or tingling in your legs or feet.  You have pain or cramps  in your legs and feet.  Your legs or feet are numb.  Your feet always feel cold. Get help right away if:  There is increasing redness, swelling, or pain in or around a wound.  There is a red line that goes up your leg.  Pus is coming from a wound.  You develop a fever or as directed by your health care provider.  You notice a bad smell coming from an ulcer or wound. This information is not intended to replace advice given to you by your health care provider. Make sure you discuss any questions you have with your health care provider. Document Released: 08/26/2000 Document Revised: 02/04/2016 Document Reviewed: 02/05/2013 Elsevier Interactive Patient Education  2017 [employer].

## 2017-06-05 NOTE — Progress Notes (Signed)
Patient ID: [name] [name], male   DOB: 08/02/1961, 56 y.o.   MRN: [id number]     Subjective: Patient presents for diabetic yearly foot examination as well as complaining of uncomfortable toenails and requests nail debridement. Patient describes occasional tingling and burning his feet on and off weightbearing is not significantly changed over the past year Patient is diabetic and denies any history of foot ulceration, claudication or amputation  Objective: Orientated 3 DP and PT pulses 2/4 bilaterally Capillary reflex immediate bilaterally  Neurological: Sensation to 10 g monofilament wire intact 5/5 bilaterally Ankle reflex reactive bilaterally Vibratory sensation reactive bilaterally  Dermatological: No open skin lesions bilaterally The toenails are elongatedldiscolored, hypertrophic with tenderness to direct palpation Moist scaling inflamed fourth left web space  Musculoskeletal: There is no restriction ankle, subtalar, midtarsal joints bilaterally Manual motor testing dorsi flexion, plantar flexion 5/5 bilaterally  Assessment: Diabetic Satisfactory neurovascular status Symptomatic onychomycoses 6-10 Tinea pedis fourth left web space Plan: Debridement toenails 6-10 mechanically and electrically without any bleeding Rx over-the-counter Clotrimazole 1% cream sig apply twice a day to webspace 2 weeks or until healed  Reappoint 3 months  Reappoint 3 months

## 2017-06-07 ENCOUNTER — Encounter: Payer: Self-pay | Admitting: Family Medicine

## 2017-06-07 ENCOUNTER — Ambulatory Visit (INDEPENDENT_AMBULATORY_CARE_PROVIDER_SITE_OTHER): Payer: Medicaid Other | Admitting: Family Medicine

## 2017-06-07 VITALS — BP 124/64 | HR 84 | Temp 99.6°F | Ht 67.0 in | Wt 287.0 lb

## 2017-06-07 DIAGNOSIS — J449 Chronic obstructive pulmonary disease, unspecified: Secondary | ICD-10-CM | POA: Diagnosis not present

## 2017-06-07 DIAGNOSIS — G4733 Obstructive sleep apnea (adult) (pediatric): Secondary | ICD-10-CM | POA: Diagnosis not present

## 2017-06-07 DIAGNOSIS — Z23 Encounter for immunization: Secondary | ICD-10-CM | POA: Diagnosis not present

## 2017-06-07 DIAGNOSIS — Z794 Long term (current) use of insulin: Secondary | ICD-10-CM

## 2017-06-07 DIAGNOSIS — E1159 Type 2 diabetes mellitus with other circulatory complications: Secondary | ICD-10-CM

## 2017-06-07 DIAGNOSIS — Z9989 Dependence on other enabling machines and devices: Secondary | ICD-10-CM

## 2017-06-07 LAB — POCT GLYCOSYLATED HEMOGLOBIN (HGB A1C): Hemoglobin A1C: 8.1

## 2017-06-07 MED ORDER — INSULIN ASPART 100 UNIT/ML ~~LOC~~ SOLN: 50.0000 [IU] | Freq: Two times a day (BID) | SUBCUTANEOUS | 1 refills | Status: DC

## 2017-06-07 MED ORDER — MOMETASONE FURO-FORMOTEROL FUM 200-5 MCG/ACT IN AERO: 2.0000 | INHALATION_SPRAY | Freq: Two times a day (BID) | RESPIRATORY_TRACT | 11 refills | Status: DC

## 2017-06-07 MED ORDER — INSULIN ASPART 100 UNIT/ML ~~LOC~~ SOLN: 50.0000 [IU] | Freq: Two times a day (BID) | SUBCUTANEOUS | 5 refills | Status: DC

## 2017-06-07 MED ORDER — INSULIN GLARGINE 100 UNIT/ML ~~LOC~~ SOLN: 55.0000 [IU] | Freq: Two times a day (BID) | SUBCUTANEOUS | 5 refills | Status: DC

## 2017-06-07 NOTE — Patient Instructions (Signed)

## 2017-06-07 NOTE — Assessment & Plan Note (Signed)
For repeat sleep study with adjustments to CPAP settings as needed.

## 2017-06-07 NOTE — Assessment & Plan Note (Signed)
Dulera refilled--we have no Breo at this time.

## 2017-06-07 NOTE — Assessment & Plan Note (Signed)
A1C with slight improvement--have increased Novolog to 50--was not taking as previously instructed--only doing 46 u bid

## 2017-06-07 NOTE — Progress Notes (Signed)
   Subjective:    Patient ID: [name] [name] is a 55 y.o. male presenting with Diabetes and COPD  on 06/07/2017  HPI: Here today for f/u. Needs referral for sleep study ENT referral for hearing aids. Needs Breo and Novolog Somewhat following diet. Not taking his insulin correctly.   Review of Systems  Constitutional: Negative for chills and fever.  Respiratory: Negative for shortness of breath.   Cardiovascular: Negative for leg swelling.  Gastrointestinal: Negative for abdominal pain, nausea and vomiting.      Objective:    BP 124/64   Pulse 84   Temp 99.6 F (37.6 C) (Oral)   Ht 5\' 7"  (1.702 m)   Wt 287 lb (130.2 kg)   SpO2 93%   BMI 44.95 kg/m  Physical Exam  Constitutional: He appears well-developed and well-nourished. No distress.  HENT:  Head: Normocephalic and atraumatic.  Eyes: No scleral icterus.  Neck: Neck supple.  Cardiovascular: Normal rate.   Pulmonary/Chest: Effort normal.  Abdominal: Soft.  Musculoskeletal: He exhibits no edema.  Neurological: He is alert.  Skin: Skin is warm.  Psychiatric: He has a normal mood and affect.  Vitals reviewed.  Hemoglobin A1C 8.1      Assessment & Plan:   Problem List Items Addressed This Visit      Medium   OSA on CPAP (Chronic)    For repeat sleep study with adjustments to CPAP settings as needed.      Relevant Orders   Split night study   DM type 2 (diabetes mellitus, type 2) ([place]) - Primary    A1C with slight improvement--have increased Novolog to 50--was not taking as previously instructed--only doing 46 u bid       Relevant Medications   insulin aspart (NOVOLOG) 100 UNIT/ML injection   insulin glargine (LANTUS) 100 UNIT/ML injection   Other Relevant Orders   HgB A1c (Completed)     Unprioritized   COPD, moderate ([place])    Dulera refilled--we have no Breo at this time.      Relevant Medications   mometasone-formoterol (DULERA) 200-5 MCG/ACT AERO    Other Visit Diagnoses    Need for  immunization against influenza       Relevant Orders   Flu Vaccine QUAD 36+ mos IM (Completed)      Total face-to-face time with patient: 25 minutes. Over 50% of encounter was spent on counseling and coordination of care. Return in about 3 months (around 09/06/2017) for a follow-up.  [name] 06/07/2017 2:19 PM

## 2017-06-26 ENCOUNTER — Ambulatory Visit (INDEPENDENT_AMBULATORY_CARE_PROVIDER_SITE_OTHER): Payer: Medicaid Other | Admitting: Pharmacist

## 2017-06-26 ENCOUNTER — Encounter: Payer: Self-pay | Admitting: Pharmacist

## 2017-06-26 DIAGNOSIS — Z720 Tobacco use: Secondary | ICD-10-CM | POA: Diagnosis not present

## 2017-06-26 DIAGNOSIS — Z794 Long term (current) use of insulin: Secondary | ICD-10-CM

## 2017-06-26 DIAGNOSIS — J449 Chronic obstructive pulmonary disease, unspecified: Secondary | ICD-10-CM

## 2017-06-26 DIAGNOSIS — E1159 Type 2 diabetes mellitus with other circulatory complications: Secondary | ICD-10-CM

## 2017-06-26 MED ORDER — METFORMIN HCL 1000 MG PO TABS: 1000.0000 mg | ORAL_TABLET | Freq: Two times a day (BID) | ORAL | 11 refills | Status: DC

## 2017-06-26 MED ORDER — MOMETASONE FURO-FORMOTEROL FUM 200-5 MCG/ACT IN AERO: 2.0000 | INHALATION_SPRAY | Freq: Two times a day (BID) | RESPIRATORY_TRACT | 11 refills | Status: DC

## 2017-06-26 MED ORDER — ALBUTEROL SULFATE HFA 108 (90 BASE) MCG/ACT IN AERS: 2.0000 | INHALATION_SPRAY | Freq: Four times a day (QID) | RESPIRATORY_TRACT | 1 refills | Status: DC | PRN

## 2017-06-26 NOTE — Patient Instructions (Addendum)
Thanks for coming in to see [place] today!  No changes in your medications were made today. We sent over more refills of your albuterol, Dulera, and metformin to your [facility]. We also gave you a sample of Breo.  Work on cutting down on your smoking. See if you can get it to less than 4 cigarettes per day by the end of the month and then quitting by the end of the year.  Come see [place] again in a month at the Rx Clinic.

## 2017-06-26 NOTE — Progress Notes (Signed)
    S:  Patient arrives self-ambulating in good spirits. Patient arrives for evaluation/assistance with tobacco dependence.  Longstanding patient of Rx Clinic.  Age when started using tobacco on a daily basis: 56 years old. Number of Cigarettes per day: 4-6. Smokes first cigarette typically around 2:00 pm. Denies waking to smoke.  Most recent quit attempt: April 2018. Longest time ever been tobacco free: >100 days.  Medications (NRT, bupropion, varenicline) used in prior in past cessation efforts include: None.   A/P: History of moderate Nicotine Dependence of many years duration in a patient who is fair candidate for success b/c of lack of commitment to quitting at this time.  He is willing to cut down. Patient was counseled on tobacco use, with goals of cutting it down from 6 cigarettes per day, to 4 cigarettes per day by the end of the month, and quitting by the end of the year.  No changes were made to patient's medication regimen today. Refills on albuterol, Dulera, and metformin were sent to his pharmacy.  Sample of BREO was provided today as he is currently out of his [facility] and he was unable to afford until next week. Written information provided. F/U Rx Clinic Visit in 1 month. Total time in face-to-face counseling 20 minutes.  Patient seen with Drusilla Kanner, PharmD Candidate.

## 2017-06-26 NOTE — Assessment & Plan Note (Signed)
History of moderate Nicotine Dependence of many years duration in a patient who is fair candidate for success b/c of lack of commitment to quitting at this time.  He is willing to cut down. Patient was counseled on tobacco use, with goals of cutting it down from 6 cigarettes per day, to 4 cigarettes per day by the end of the month, and quitting by the end of the year.

## 2017-06-27 ENCOUNTER — Ambulatory Visit (HOSPITAL_BASED_OUTPATIENT_CLINIC_OR_DEPARTMENT_OTHER): Payer: Medicaid Other | Attending: Family Medicine | Admitting: Internal Medicine

## 2017-06-27 ENCOUNTER — Telehealth: Payer: Self-pay | Admitting: *Deleted

## 2017-06-27 ENCOUNTER — Ambulatory Visit (HOSPITAL_BASED_OUTPATIENT_CLINIC_OR_DEPARTMENT_OTHER): Payer: Medicaid Other

## 2017-06-27 VITALS — Ht 67.0 in | Wt 270.0 lb

## 2017-06-27 DIAGNOSIS — G4733 Obstructive sleep apnea (adult) (pediatric): Secondary | ICD-10-CM | POA: Diagnosis present

## 2017-06-27 DIAGNOSIS — Z9989 Dependence on other enabling machines and devices: Secondary | ICD-10-CM | POA: Insufficient documentation

## 2017-06-27 NOTE — Telephone Encounter (Signed)
Tried [employer] back and he was on a call.  Will call him at his direct number [phone] if a few. [name] [name],CMA

## 2017-06-27 NOTE — Addendum Note (Signed)
Addended by: Valerie Roys on: 06/27/2017 11:45 AM   Modules accepted: Orders

## 2017-06-27 NOTE — Progress Notes (Signed)
Patient ID: [name] [name], male   DOB: 11/21/1960, 56 y.o.   MRN: [id number] Reviewed: Agree with Dr. Koval's documentation and management. 

## 2017-06-27 NOTE — Telephone Encounter (Signed)
Lynnae Sandhoff with [facility] Sleep Study dept called stating patient has upcoming split sleep night study.  Provider is also wanting to adjust CPAP settings accordingly.  Per Lynnae Sandhoff if patient has not had any break in therapy of CPAP; a CPAP titration can be done in place of the split sleep study. CPAP titration can adjust the measurements accordingly. Please give him a call at [phone].  Derl Barrow, RN

## 2017-06-27 NOTE — Telephone Encounter (Signed)
Spoke with Lynnae Sandhoff and gave him the ok to do a CPAP titration vs a whole new CPAP study since patient has been compliant with treatment.  They will call patient to schedule this. Jazmin Hartsell,CMA

## 2017-06-29 ENCOUNTER — Telehealth: Payer: Self-pay | Admitting: *Deleted

## 2017-06-29 ENCOUNTER — Other Ambulatory Visit: Payer: Self-pay | Admitting: Family Medicine

## 2017-06-29 DIAGNOSIS — E1110 Type 2 diabetes mellitus with ketoacidosis without coma: Secondary | ICD-10-CM

## 2017-06-29 DIAGNOSIS — Z794 Long term (current) use of insulin: Principal | ICD-10-CM

## 2017-06-29 NOTE — Telephone Encounter (Signed)
Pharmacy LM on nurse line.  They want to make the provider aware that pt has requested a refill on his nitro that was just filled one month ago with #100 pills.  Alee Katen, Salome Spotted, CMA

## 2017-07-05 DIAGNOSIS — G4733 Obstructive sleep apnea (adult) (pediatric): Secondary | ICD-10-CM | POA: Diagnosis not present

## 2017-07-05 DIAGNOSIS — Z9989 Dependence on other enabling machines and devices: Secondary | ICD-10-CM | POA: Diagnosis not present

## 2017-07-05 NOTE — Procedures (Signed)
  Patient Name: [name] [name], [name] [name] Date: 06/27/2017 Gender: Male D.O.B: 01/30/1961 Age (years): 56 Referring Provider: [name] Height (inches): 67 Interpreting Physician: Baird Lyons MD, ABSM Weight (lbs): 270 RPSGT: Carolin Coy BMI: 42 MRN: [id number] Neck Size: 20.50 CLINICAL INFORMATION The patient is referred for a CPAP titration to treat sleep apnea.  Date of NPSG, Split Night or HST: 07/21/15  AHI 106.6/ hr, desaturation to 86%, body weight 260 lbs  SLEEP STUDY TECHNIQUE As per the AASM Manual for the Scoring of Sleep and Associated Events v2.3 (April 2016) with a hypopnea requiring 4% desaturations.  The channels recorded and monitored were frontal, central and occipital EEG, electrooculogram (EOG), submentalis EMG (chin), nasal and oral airflow, thoracic and abdominal wall motion, anterior tibialis EMG, snore microphone, electrocardiogram, and pulse oximetry. Continuous positive airway pressure (CPAP) was initiated at the beginning of the study and titrated to treat sleep-disordered breathing.  MEDICATIONS Medications self-administered by patient taken the night of the study : none reported  TECHNICIAN COMMENTS Comments added by technician: PATIENT WAS ORDERED AS A CPAP TITRATION.  Comments added by scorer: N/A  RESPIRATORY PARAMETERS Optimal PAP Pressure (cm): 19 AHI at Optimal Pressure (/hr): 0.0 Overall Minimal O2 (%): 85.00 Supine % at Optimal Pressure (%): 100 Minimal O2 at Optimal Pressure (%): 89.0    SLEEP ARCHITECTURE The study was initiated at 10:41:35 PM and ended at 4:51:44 AM.  Sleep onset time was 76.7 minutes and the sleep efficiency was 63.2%. The total sleep time was 234.0 minutes.  The patient spent 22.44% of the night in stage N1 sleep, 62.18% in stage N2 sleep, 0.00% in stage N3 and 15.38% in REM.Stage REM latency was 166.0 minutes  Wake after sleep onset was 59.5. Alpha intrusion was absent. Supine sleep was 77.10%.  CARDIAC  DATA The 2 lead EKG demonstrated sinus rhythm. The mean heart rate was 78.52 beats per minute. Other EKG findings include: PVCs.  LEG MOVEMENT DATA The total Periodic Limb Movements of Sleep (PLMS) were 70. The PLMS index was 17.95. A PLMS index of <15 is considered normal in adults.  IMPRESSIONS - The optimal PAP pressure was 19 cm of water. - Central sleep apnea was not noted during this titration (CAI = 0.0/h). - Moderate oxygen desaturations were observed during this titration (min O2 = 85.00%). - The patient snored with moderate snoring volume during this titration study. - 2-lead EKG demonstrated: PVCs - Mild periodic limb movements were observed during this study. Arousals associated with PLMs were rare.  DIAGNOSIS - Obstructive Sleep Apnea (327.23 [G47.33 ICD-10])  RECOMMENDATIONS - Trial of CPAP therapy on 19 cm H2O with a Large size Resmed Full Face Mask AirFit F20 mask and heated humidification. - Avoid alcohol, sedatives and other CNS depressants that may worsen sleep apnea and disrupt normal sleep architecture. - Sleep hygiene should be reviewed to assess factors that may improve sleep quality. - Weight management and regular exercise should be initiated or continued.  [Electronically signed] 07/05/2017 03:02 PM  Baird Lyons MD, [place], American Board of Sleep Medicine   NPI: [id number]  [place], [place] [facility]  ELECTRONICALLY SIGNED ON:  07/05/2017, 2:59 PM [place] PH: [phone]   FX: [phone] [place]

## 2017-07-12 ENCOUNTER — Telehealth: Payer: Self-pay | Admitting: *Deleted

## 2017-07-12 NOTE — Telephone Encounter (Signed)
Received fax request for prior authorization from [place] for [place].  Copy of Medicaid formulary and form placed in MD's box for completion.  Velora Heckler, RN

## 2017-07-20 NOTE — Telephone Encounter (Signed)
Completed PA info in [place] for victoza.  Status pending.  Will recheck status in 24 hours. Taras Rask, Salome Spotted, CMA

## 2017-07-21 NOTE — Telephone Encounter (Signed)
Prior approval for Victozacompleted via [place] [employer].  Med approved for 07/20/17 - 07/15/2018  Prior approval # [id number].  [place] informed.  [place], Salome Spotted, CMA

## 2017-07-27 ENCOUNTER — Ambulatory Visit: Payer: Medicaid Other | Admitting: Pharmacist

## 2017-07-27 ENCOUNTER — Encounter: Payer: Self-pay | Admitting: Pharmacist

## 2017-07-27 DIAGNOSIS — E1159 Type 2 diabetes mellitus with other circulatory complications: Secondary | ICD-10-CM

## 2017-07-27 DIAGNOSIS — Z794 Long term (current) use of insulin: Secondary | ICD-10-CM | POA: Diagnosis not present

## 2017-07-27 DIAGNOSIS — J449 Chronic obstructive pulmonary disease, unspecified: Secondary | ICD-10-CM

## 2017-07-27 DIAGNOSIS — Z720 Tobacco use: Secondary | ICD-10-CM | POA: Diagnosis not present

## 2017-07-27 MED ORDER — INSULIN ASPART 100 UNIT/ML ~~LOC~~ SOLN: 50.0000 [IU] | Freq: Two times a day (BID) | SUBCUTANEOUS | 0 refills | Status: DC

## 2017-07-27 MED ORDER — INSULIN GLARGINE 100 UNIT/ML ~~LOC~~ SOLN: 60.0000 [IU] | Freq: Two times a day (BID) | SUBCUTANEOUS | 5 refills | Status: DC

## 2017-07-27 MED ORDER — ALBUTEROL SULFATE HFA 108 (90 BASE) MCG/ACT IN AERS: 2.0000 | INHALATION_SPRAY | Freq: Four times a day (QID) | RESPIRATORY_TRACT | 1 refills | Status: DC | PRN

## 2017-07-27 MED ORDER — FLUTICASONE FUROATE-VILANTEROL 200-25 MCG/INH IN AEPB: 1.0000 | INHALATION_SPRAY | Freq: Every day | RESPIRATORY_TRACT | 0 refills | Status: DC

## 2017-07-27 NOTE — Patient Instructions (Addendum)
Thanks for coming in to see [place] today!  1. We gave you a sample of Breo which we want you to use 1 puff once a day.  2. We gave you a sample of Novolog to continue using 50 units twice daily before a meal.  3. Prescriptions for your albuterol inhaler and Lantus were sent to your pharmacy. Continue these medications as you've been taking them.  Come back to see [place] again in the Rx Clinic in January.

## 2017-07-27 NOTE — Assessment & Plan Note (Signed)
Diabetes longstanding currently uncontrolled. Patient reports 1 hypoglycemic event since his last visit (BG: 56 around 11:30 am) and is able to verbalize appropriate hypoglycemia management plan. Patient reports adherence with medication. Control is suboptimal due to dietary indiscretions and sedentary lifestyle.Increased dose of basal insulin Lantus (insulin glargine) from 55 units BID to 60 units BID (as patient had reported taking at his last 2 visits with Rx Clinic); a new prescription was sent to his pharmacy. Continued rapid insulin Novolog (insulin aspart) at 55 units BID before meals; patient was given a sample of Novolog at this visit. Continued Victoza (liraglutide) at 0.3 mL (1.8 mg) daily and metformin 1000 mg BID with meals.

## 2017-07-27 NOTE — Assessment & Plan Note (Signed)
COPD longstanding, currently controlled. Patient had no complaints of SOB today. Reported only using his albuterol inhaler a few times a month. Continued current medication regimen of albuterol 108 mcg/act 2 puffs q6h PRN SOB or wheezing; new medication sent to his pharmacy. Continued Dulera 200-5 mcg/act 2 puffs BID. Gave patient a sample of Breo 200/25 as requested by the patient.

## 2017-07-27 NOTE — Assessment & Plan Note (Signed)
#  Tobacco Dependence History of moderate nicotine dependence of 37 years, in a patient who is a fair candidate for success b/c of lack of commitment to quitting at this time. Patient is willing and to cut down on his number of cigarettes per day. Patient was counseled on tobacco use, with a short-term goal of cutting down from 6-4 cigarettes/day to 2 cigarettes/day by his next visit, and long-term goal of quitting smoking altogether.   Marland Kitchen

## 2017-07-27 NOTE — Progress Notes (Signed)
S:  Patient arrives in good spirits, ambulating without assistance. Patient arrives for evaluation/assistance with tobacco dependence and for diabetes evaluation, education, and management. Long-standing patient of Rx Clinic. Patient was last seen in Rx Clinic on 06/26/17. Patient requests samples of Breo and Novolog and prescription refills for albuterol and Lantus.  #Smoking Cessation: Age when started using tobacco on a daily basis 56 years old. Number of Cigarettes per day 4-6; also reports going as much as 2 days in a row without a single cigarette. Smokes first cigarette typically around 2:00pm. Denies waking to smoke.    Most recent quit attempt: April 2018. Longest time ever been tobacco free: >100 days.  Medications (NRT, bupropion, varenicline) used in prior cessation efforts include: none.  Patient reports using regular mints and toothpicks to help with his cravings and endorses them helping him a lot.  Most common triggers to use tobacco include: holding sign at the corner (people will offer him cigarettes) and when he drinks coffee or diet pepsi   Family/Social History: None of his family members smoke (is going to visit his mom in [place] for Thanksgiving next week; she does not allow him to smoke while he's there) and he is not normally around people who smoke.  Patient's long-term goal is to "slowly but surely" quit smoking for good. Short-term goal: no more than 2 cigarettes per day by his next visit with Rx Clinic.  Patient denied any SOB and only having to use his albuterol a few times a month.  #T2DM: Patient reports Diabetes was diagnosed in 2013.  Patient reports adherence with medications - except for his lisinopril, which he reports only taking once daily instead of BID (as prescribed).  Current diabetes medications include: Novolog 50 units BID before meals, Lantus 55 units BID (patient reports taking 60 units BID), liraglutide (Victoza) 1.8 mg daily, and  metformin 1000 mg BID with meals. Current hypertension medications include: carvedilol 3.125 mg BID and lisinopril 2.5 mg BID (patient is taking once daily instead of BID).  Patient reports 1 recent hypoglycemic event - BG was 56 at the time; patient denied feeling hypoglycemic, but took 3 glucose tablets to raise his blood sugars after checking his meter and seeing the low number.  Patient reported dietary habits: Eats 3 meals/day Breakfast: toast Lunch: sandwich or fruit Dinner: baked chicked Drinks: diet Pepsi  Patient reported exercise habits: walking 20 minutes every other day.   Patient reports nocturia - 1-2x/night.  Patient reports neuropathy - especially in his hands. Patient reports visual changes - notices major changes in blurriness when his sugars are higher. Patient reports self foot exams - denies any issues.  O:  Physical Exam  Constitutional: He appears well-developed and well-nourished.   Review of Systems  All other systems reviewed and are negative.  Lab Results  Component Value Date   HGBA1C 8.1 06/07/2017   Vitals:   07/27/17 1412  BP: (!) 142/82  Pulse: 72   Home fasting CBG: average - 150s (range 120s-180s) (patient-reported)  10-year ASCVD risk: 24.5%.  A/P: #Tobacco Dependence History of moderate nicotine dependence of 37 years, in a patient who is a fair candidate for success b/c of lack of commitment to quitting at this time. Patient is willing and to cut down on his number of cigarettes per day. Patient was counseled on tobacco use, with a short-term goal of cutting down from 6-4 cigarettes/day to 2 cigarettes/day by his next visit, and long-term goal of quitting smoking altogether.    #  T2DM Diabetes longstanding currently uncontrolled. Patient reports 1 hypoglycemic event since his last visit (BG: 56 around 11:30 am) and is able to verbalize appropriate hypoglycemia management plan. Patient reports adherence with medication. Control is  suboptimal due to dietary indiscretions and sedentary lifestyle.Increased dose of basal insulin Lantus (insulin glargine) from 55 units BID to 60 units BID (as patient had reported taking at his last 2 visits with Rx Clinic); a new prescription was sent to his pharmacy. Continued rapid insulin Novolog (insulin aspart) at 55 units BID before meals; patient was given a sample of Novolog at this visit. Continued Victoza (liraglutide) at 0.3 mL (1.8 mg) daily and metformin 1000 mg BID with meals.  Next A1C anticipated: January 2019.    #HLD ASCVD risk greater than 7.5%. Continued aspirin 81 mg daily, Continued atorvastatin 80 mg daily.  #HTN Hypertension longstanding, currently uncontrolled, with an in-office BP of 142/82 after not taking any medications before his appointment. Patient reports adherence with medication - except with his lisinopril, which he is only taking once daily instead of BID. Control is suboptimal due to medication discrepencies. Continued current regimen of carvedilol 3.125 mg BID and lisinopril 2.5 mg BID.  #COPD COPD longstanding, currently controlled. Patient had no complaints of SOB today. Reported only using his albuterol inhaler a few times a month. Continued current medication regimen of albuterol 108 mcg/act 2 puffs q6h PRN SOB or wheezing; new medication sent to his pharmacy. Continued Dulera 200-5 mcg/act 2 puffs BID. Gave patient a sample of Breo 200/25 as requested by the patient.   Written information provided.  F/U Rx Clinic Visit in January. Total time in face-to-face counseling 30 minutes.  Patient seen with Drusilla Kanner, PharmD Candidate and Deirdre Pippins, PharmD, PGY2 Resident.

## 2017-07-28 NOTE — Progress Notes (Addendum)
Patient ID: [name] [name], male   DOB: 05/01/1961, 56 y.o.   MRN: [id number] Reviewed: Agree with Dr. Koval's documentation and management. 

## 2017-08-09 ENCOUNTER — Other Ambulatory Visit: Payer: Self-pay

## 2017-08-09 ENCOUNTER — Emergency Department (HOSPITAL_COMMUNITY)
Admission: EM | Admit: 2017-08-09 | Discharge: 2017-08-09 | Disposition: A | Discharge status: Home / Self Care | Payer: Medicaid Other | Attending: Emergency Medicine | Admitting: Emergency Medicine

## 2017-08-09 ENCOUNTER — Emergency Department (HOSPITAL_COMMUNITY): Payer: Medicaid Other

## 2017-08-09 ENCOUNTER — Encounter (HOSPITAL_COMMUNITY): Payer: Self-pay

## 2017-08-09 DIAGNOSIS — Z79891 Long term (current) use of opiate analgesic: Secondary | ICD-10-CM | POA: Diagnosis not present

## 2017-08-09 DIAGNOSIS — E1140 Type 2 diabetes mellitus with diabetic neuropathy, unspecified: Secondary | ICD-10-CM | POA: Insufficient documentation

## 2017-08-09 DIAGNOSIS — J45909 Unspecified asthma, uncomplicated: Secondary | ICD-10-CM | POA: Diagnosis not present

## 2017-08-09 DIAGNOSIS — L989 Disorder of the skin and subcutaneous tissue, unspecified: Secondary | ICD-10-CM | POA: Diagnosis present

## 2017-08-09 DIAGNOSIS — Z794 Long term (current) use of insulin: Secondary | ICD-10-CM | POA: Diagnosis not present

## 2017-08-09 DIAGNOSIS — I10 Essential (primary) hypertension: Secondary | ICD-10-CM | POA: Insufficient documentation

## 2017-08-09 DIAGNOSIS — Z7982 Long term (current) use of aspirin: Secondary | ICD-10-CM | POA: Diagnosis not present

## 2017-08-09 DIAGNOSIS — Z7984 Long term (current) use of oral hypoglycemic drugs: Secondary | ICD-10-CM | POA: Insufficient documentation

## 2017-08-09 DIAGNOSIS — L03032 Cellulitis of left toe: Secondary | ICD-10-CM | POA: Insufficient documentation

## 2017-08-09 DIAGNOSIS — J449 Chronic obstructive pulmonary disease, unspecified: Secondary | ICD-10-CM | POA: Insufficient documentation

## 2017-08-09 DIAGNOSIS — Z79899 Other long term (current) drug therapy: Secondary | ICD-10-CM | POA: Insufficient documentation

## 2017-08-09 DIAGNOSIS — L02612 Cutaneous abscess of left foot: Secondary | ICD-10-CM

## 2017-08-09 DIAGNOSIS — F17210 Nicotine dependence, cigarettes, uncomplicated: Secondary | ICD-10-CM | POA: Diagnosis not present

## 2017-08-09 LAB — CBG MONITORING, ED: Glucose-Capillary: 213 mg/dL — ABNORMAL HIGH (ref 65–99)

## 2017-08-09 MED ORDER — SULFAMETHOXAZOLE-TRIMETHOPRIM 800-160 MG PO TABS
1.0000 | ORAL_TABLET | Freq: Once | ORAL | Status: AC
  Administered 2017-08-09: 1 via ORAL
  Filled 2017-08-09: qty 1

## 2017-08-09 MED ORDER — SULFAMETHOXAZOLE-TRIMETHOPRIM 800-160 MG PO TABS: 1.0000 | ORAL_TABLET | Freq: Two times a day (BID) | ORAL | 0 refills | Status: AC

## 2017-08-09 NOTE — ED Triage Notes (Signed)
Patient presents with "tingling neuropathy pain" in his right hand, right foot, and left foot. Patient reports he was diagnosed with diabetes type 1 approx 5 years ago. Patient states " I just want someone to look at my arms and legs." Patient also reports "a bubble on my left foot and I want it checked out." Patient states his battery on his glucometer died last night and hasnt checked his BG since yesterday.  FSBG in triage is 213. Patient reports he last at ate a peanut butter and jelly sandwich with a  Glass of milk for breakfast.

## 2017-08-09 NOTE — Discharge Instructions (Signed)
See your Physician or your Podiatrist for recheck in 2 days.

## 2017-08-09 NOTE — ED Provider Notes (Signed)
[facility] DEPT Provider Note   CSN: [id number] Arrival date & time: 08/09/17  1054     History   Chief Complaint Chief Complaint  Patient presents with  . Peripheral Neuropathy  . Wound Check    HPI [name] [name] is a 56 y.o. male.  The history is provided by the patient.  Wound Check  This is a new problem. The current episode started more than 2 days ago. The problem occurs constantly. The problem has not changed since onset.Nothing aggravates the symptoms. Nothing relieves the symptoms. He has tried nothing for the symptoms. The treatment provided no relief.  Pt reports he has diabetic neuropathy.  He reports numbness in both hands and feet.  Pt wants to get all checked but he is primarily concerned about a blister on his left foot.  Pt reports his shoes rubbed his foot.  Pt reports he now has new shoes but area has persist.   Past Medical History:  Diagnosis Date  . Alcoholism ([place])   . Anxiety   . Arthritis    "qwhere"  . Asthma   . CAD S/P percutaneous coronary angioplasty 01/18/12; 10/2013   a. pRCA 3.5 x 18 vision BMS - 4.2 mm; b. 2/'15: mCx 3.5 x 12  [place]bel BMS (3.6-3.7 mm)  . Chronic back pain    "mid/lower" (08/01/2014)  . COPD (chronic obstructive pulmonary disease) ([place])    "I'm seeing COPD dr now; don't know if I've got it" (08/01/2014)  . Depression   . GERD (gastroesophageal reflux disease)   . Hyperlipidemia   . Hypertension   . Migraines    "once in awhile" (08/01/2014)  . Non-ST elevation myocardial infarction (NSTEMI) ([place]) 10/2013  . OSA on CPAP 01/19/2012  . Type II diabetes mellitus [facility])     Patient Active Problem List   Diagnosis Date Noted  . Depression, acute 02/17/2016  . Hearing difficulty 12/10/2014  . COPD, moderate ([place]) 11/22/2014  . Cough   . Angina, class II ([place]) 08/01/2014  . H/O NSTEMI (non-ST elevated myocardial infarction) 11/07/2013  . SOB (shortness of breath) on exertion 10/04/2013  .  Pre-ulcerative corn or callous 10/12/2012  . Dental caries 10/12/2012  . Back pain 08/24/2012  . Tobacco abuse 08/24/2012  . Obesity 07/31/2012  . DM type 2 (diabetes mellitus, type 2) ([place]) 06/05/2012  . CAD S/P percutaneous coronary angioplasty 01/21/2012  . OSA on CPAP 01/19/2012  . Essential hypertension 01/18/2012  . Hyperlipidemia with target LDL less than 70 01/18/2012    Past Surgical History:  Procedure Laterality Date  . CARDIAC CATHETERIZATION  07/2014   Left Main: Short, large-caliber vessel. Widely patent. Bifurcates into the LAD and Circumflex. Angiographically normal.  . CORONARY ANGIOPLASTY WITH STENT PLACEMENT  01/2012; 11/07/2013; 08/01/2014   "1 + 2 + 1"   . CORONARY ANGIOPLASTY WITH STENT PLACEMENT  07/2014   Severe single-vessel disease involving the bifurcation of OM1 and OM 2 with 99% in-stent restenosis in the bare-metal stent placed in the OM 2.  . LEFT HEART CATHETERIZATION WITH CORONARY ANGIOGRAM N/A 01/20/2012   Procedure: LEFT HEART CATHETERIZATION WITH CORONARY ANGIOGRAM;  Surgeon: Jettie Booze, MD;  Location: [facility] CATH LAB;  Service: Cardiovascular;  Laterality: N/A;  . LEFT HEART CATHETERIZATION WITH CORONARY ANGIOGRAM N/A 11/07/2013   Procedure: LEFT HEART CATHETERIZATION WITH CORONARY ANGIOGRAM;  Surgeon: Jettie Booze, MD;  Location: [facility] CATH LAB;  Service: Cardiovascular;  Laterality: N/A;  . LEFT HEART CATHETERIZATION WITH CORONARY ANGIOGRAM N/A  08/01/2014   Procedure: LEFT HEART CATHETERIZATION WITH CORONARY ANGIOGRAM;  Surgeon: Leonie Man, MD;  Location: [facility] CATH LAB;  Service: Cardiovascular;  Laterality: N/A;  . PERCUTANEOUS CORONARY STENT INTERVENTION (PCI-S)  01/20/2012   Procedure: PERCUTANEOUS CORONARY STENT INTERVENTION (PCI-S);  Surgeon: Jettie Booze, MD;  Location: [facility] CATH LAB;  Service: Cardiovascular;;  . PERCUTANEOUS CORONARY STENT INTERVENTION (PCI-S)  11/07/2013   Procedure: PERCUTANEOUS CORONARY STENT INTERVENTION  (PCI-S);  Surgeon: Jettie Booze, MD;  Location: [facility] CATH LAB;  Service: Cardiovascular;;       Home Medications    Prior to Admission medications   Medication Sig Start Date End Date Taking? Authorizing Provider  ACCU-CHEK FASTCLIX LANCETS MISC 1 Units by Percutaneous route 4 (four) times daily. 07/01/15   [name], MD  albuterol (PROVENTIL HFA;VENTOLIN HFA) 108 (90 Base) MCG/ACT inhaler Inhale 2 puffs every 6 (six) hours as needed into the lungs for wheezing or shortness of breath. 07/27/17   Zenia Resides, MD  aspirin 81 MG chewable tablet Chew 1 tablet (81 mg total) by mouth daily. 10/04/13   [name], MD  atorvastatin (LIPITOR) 80 MG tablet TAKE 1 TABLET (80 MG TOTAL) BY MOUTH DAILY. Patient taking differently: TAKE 1 TABLET (80 MG TOTAL) BY MOUTH EVERY EVENING 10/17/16   [name], MD  B-D INS SYR ULTRAFINE 1CC/31G 31G X 5/16" 1 ML MISC INJECT 5 TIMES DAILY AS DIRECTED. 07/18/16   [name], MD  BD ULTRA-FINE PEN NEEDLES 29G X 12.7MM MISC USE TO WITH VICTOZA PEN, INJECTION ONCE DAILY. 05/05/17   [name], MD  Blood Glucose Monitoring Suppl (ACCU-CHEK AVIVA PLUS) w/Device KIT Check fasting, and two hours after breakfast, lunch and dinner. ICD-10 code E11.9. 08/31/16   [name], MD  carvedilol (COREG) 3.125 MG tablet TAKE 1 TABLET BY MOUTH TWO TIMES A DAY WITH A MEAL 02/13/17   [name], MD  citalopram (CELEXA) 20 MG tablet Take 1 tablet (20 mg total) by mouth daily. Patient taking differently: Take 20 mg by mouth every evening.  02/17/16   [name], MD  fluticasone furoate-vilanterol (BREO ELLIPTA) 200-25 MCG/INH AEPB Inhale 1 puff daily into the lungs. 07/27/17   Zenia Resides, MD  glucose blood (ACCU-CHEK AVIVA) test strip Use test blood sugar 4 times a day. ICD-10 code: E11.9 08/09/16   [name], MD  insulin aspart (NOVOLOG) 100 UNIT/ML injection Inject 50 Units 2 (two) times daily before a meal into the skin. 07/27/17   Zenia Resides, MD  insulin glargine (LANTUS) 100 UNIT/ML injection Inject 0.6 mLs (60 Units total) 2 (two) times daily into the skin. 07/27/17   Zenia Resides, MD  lisinopril (PRINIVIL,ZESTRIL) 2.5 MG tablet Take 1 tablet (2.5 mg total) by mouth 2 (two) times daily. 12/29/16   [name], MD  metFORMIN (GLUCOPHAGE) 1000 MG tablet Take 1 tablet (1,000 mg total) by mouth 2 (two) times daily with a meal. 06/26/17   Hensel, Jamal Collin, MD  mometasone-formoterol (DULERA) 200-5 MCG/ACT AERO Inhale 2 puffs into the lungs 2 (two) times daily. 06/26/17   Zenia Resides, MD  Multiple Vitamin (MULTIVITAMIN) tablet Take 1 tablet by mouth daily.    [provider]  nitroGLYCERIN (NITROSTAT) 0.4 MG SL tablet PLACE 1 TABLET UNDER THE TONGUE EVERY 5 MINUTES FOR 3 DOSES AS NEEDED FOR CHEST PAIN *IF NO RELIEF, CALL 911* 03/22/17   [name], MD  Omega-3 Fatty  Acids (FISH OIL PO) Take 1,000 mg by mouth 2 (two) times daily.     [provider]  prasugrel (EFFIENT) 10 MG TABS tablet Take 1 tablet (10 mg total) by mouth daily. 04/03/17   [place], Peter M, MD  sulfamethoxazole-trimethoprim (BACTRIM DS,SEPTRA DS) 800-160 MG tablet Take 1 tablet by mouth 2 (two) times daily for 7 days. 08/09/17 08/16/17  Fransico Meadow, PA-C  traMADol (ULTRAM) 50 MG tablet TAKE ONE TABLET BY MOUTH EVERY 6 HOURS AS NEEDED 02/13/17   [name], MD  VICTOZA 18 MG/3ML SOPN INJECT 0.3 MLS (1.8 MG TOTAL) INTO THE SKIN DAILY 06/29/17   [name], MD    Family History Family History  Problem Relation Age of Onset  . Heart attack Father 28  . Breast cancer Mother   . Hypertension Sister   . Hyperlipidemia Sister   . Hyperlipidemia Sister   . Hypertension Sister   . Heart attack Brother   . Stroke Brother   . Diabetes Brother   . Diabetes Sister     Social History Social History   Tobacco Use  . Smoking status: Light Tobacco Smoker    Packs/day: 1.00    Years: 37.00    Pack years: 37.00    Types:  Cigarettes    Last attempt to quit: 12/17/2016    Years since quitting: 0.6  . Smokeless tobacco: Never Used  . Tobacco comment: relapsed briefly in early 2018 - Quit in 4/18  Substance Use Topics  . Alcohol use: Yes    Alcohol/week: 3.6 oz    Types: 6 Cans of beer per week    Comment: pt reports "I quit 66 days ago" (05/09/16)  . Drug use: No    Comment: Crack Cocaine (denies 05/09/16)     Allergies   Shellfish allergy   Review of Systems Review of Systems  All other systems reviewed and are negative.    Physical Exam Updated Vital Signs BP 135/79 (BP Location: Right Arm)   Pulse 98   Temp 98.1 F (36.7 C) (Oral)   Resp 18   Ht '5\' 7"'  (1.702 m)   Wt 130.6 kg (288 lb)   SpO2 99%   BMI 45.11 kg/m   Physical Exam  Constitutional: He appears well-developed and well-nourished.  Musculoskeletal: Normal range of motion.  From all xtremities,  Hands no sores present.  Right foot no wounds.  Left foot, blister at base of 4th/5th toe, slight erythema to toes.   Neurological: He is alert.  Skin: Skin is warm.  Nursing note reviewed.    ED Treatments / Results  Labs (all labs ordered are listed, but only abnormal results are displayed) Labs Reviewed  CBG MONITORING, ED - Abnormal; Notable for the following components:      Result Value   Glucose-Capillary 213 (*)    All other components within normal limits    EKG  EKG Interpretation None       Radiology Dg Foot Complete Left  Result Date: 08/09/2017 CLINICAL DATA:  Wound to plantar surface of foot for 1 month. EXAM: LEFT FOOT - COMPLETE 3+ VIEW COMPARISON:  06/19/2012 FINDINGS: No gross bony destruction to suggest osteomyelitis. No gas visible within the plantar soft tissues. Vascular calcifications suggest diabetes. IMPRESSION: Negative. Electronically Signed   By: Misty Stanley M.D.   On: 08/09/2017 12:29    Procedures Procedures (including critical care time)  Medications Ordered in ED Medications    sulfamethoxazole-trimethoprim (BACTRIM DS,SEPTRA DS) 800-160  MG per tablet 1 tablet (1 tablet Oral Given 08/09/17 1307)     Initial Impression / Assessment and Plan / ED Course  I have reviewed the triage vital signs and the nursing notes.  Pertinent labs & imaging results that were available during my care of the patient were reviewed by me and considered in my medical decision making (see chart for details).     Xray shows no sign of osteo.  Pt has poorly controlled diabetes from his medical records.  I explained to pt that I am very concerned about the blister.   I will start him on antibiotics.  He needs to have a recheck in 2 days.  Pt does not want to follow up with his primary or his foot doctor.  He will agree to recheck here in 2 days.    Final Clinical Impressions(s) / ED Diagnoses   Final diagnoses:  Abscess or cellulitis, toe, left    ED Discharge Orders        Ordered    sulfamethoxazole-trimethoprim (BACTRIM DS,SEPTRA DS) 800-160 MG tablet  2 times daily     08/09/17 1246    An After Visit Summary was printed and given to the patient.    Fransico Meadow, [place] 08/09/17 1537    Carmin Muskrat, MD 08/09/17 [phone]

## 2017-08-09 NOTE — ED Notes (Signed)
Bed: WTR7 Expected date:  Expected time:  Means of arrival:  Comments: 

## 2017-08-12 ENCOUNTER — Other Ambulatory Visit: Payer: Self-pay | Admitting: Family Medicine

## 2017-08-16 ENCOUNTER — Telehealth: Payer: Self-pay | Admitting: Family Medicine

## 2017-08-16 NOTE — Telephone Encounter (Signed)
Pt needs new accu-check reader. He put new batteries in it and it still doesn't work. He called his pharmacy and they said they faxed over a Rx for dr to approve and they havent heard back yet. Please advise

## 2017-08-17 NOTE — Telephone Encounter (Signed)
LM for patient letting him know that this was taken care of on 08/14/17. Kimmie Doren,CMA

## 2017-08-23 ENCOUNTER — Ambulatory Visit: Payer: Medicaid Other | Admitting: Family Medicine

## 2017-08-23 ENCOUNTER — Encounter: Payer: Self-pay | Admitting: Family Medicine

## 2017-08-23 ENCOUNTER — Other Ambulatory Visit: Payer: Self-pay

## 2017-08-23 ENCOUNTER — Telehealth: Payer: Self-pay | Admitting: *Deleted

## 2017-08-23 VITALS — BP 128/64 | HR 88 | Temp 98.9°F | Ht 67.0 in | Wt 291.0 lb

## 2017-08-23 DIAGNOSIS — E1159 Type 2 diabetes mellitus with other circulatory complications: Secondary | ICD-10-CM

## 2017-08-23 DIAGNOSIS — Z794 Long term (current) use of insulin: Secondary | ICD-10-CM

## 2017-08-23 DIAGNOSIS — Z9989 Dependence on other enabling machines and devices: Secondary | ICD-10-CM | POA: Diagnosis not present

## 2017-08-23 DIAGNOSIS — J449 Chronic obstructive pulmonary disease, unspecified: Secondary | ICD-10-CM | POA: Diagnosis not present

## 2017-08-23 DIAGNOSIS — I2510 Atherosclerotic heart disease of native coronary artery without angina pectoris: Secondary | ICD-10-CM | POA: Diagnosis not present

## 2017-08-23 DIAGNOSIS — G4733 Obstructive sleep apnea (adult) (pediatric): Secondary | ICD-10-CM | POA: Diagnosis not present

## 2017-08-23 DIAGNOSIS — B353 Tinea pedis: Secondary | ICD-10-CM

## 2017-08-23 DIAGNOSIS — Z9861 Coronary angioplasty status: Secondary | ICD-10-CM | POA: Diagnosis not present

## 2017-08-23 DIAGNOSIS — I10 Essential (primary) hypertension: Secondary | ICD-10-CM | POA: Diagnosis not present

## 2017-08-23 LAB — POCT GLYCOSYLATED HEMOGLOBIN (HGB A1C): HEMOGLOBIN A1C: 7.9

## 2017-08-23 MED ORDER — INSULIN ASPART 100 UNIT/ML ~~LOC~~ SOLN: 50.0000 [IU] | Freq: Two times a day (BID) | SUBCUTANEOUS | 0 refills | Status: DC

## 2017-08-23 MED ORDER — NYSTATIN 100000 UNIT/GM EX POWD
Freq: Two times a day (BID) | CUTANEOUS | 5 refills | Status: DC
Start: 2017-08-23 — End: 2017-12-25

## 2017-08-23 MED ORDER — METFORMIN HCL 1000 MG PO TABS: 1000.0000 mg | ORAL_TABLET | Freq: Two times a day (BID) | ORAL | 11 refills | Status: DC

## 2017-08-23 MED ORDER — FLUTICASONE FUROATE-VILANTEROL 200-25 MCG/INH IN AEPB: 1.0000 | INHALATION_SPRAY | Freq: Every day | RESPIRATORY_TRACT | 0 refills | Status: DC

## 2017-08-23 NOTE — Assessment & Plan Note (Signed)
BP at goal, continue lisinopril, coreg

## 2017-08-23 NOTE — Assessment & Plan Note (Addendum)
Continue Victoza, Insulin, Metformin--A1C is improving.

## 2017-08-23 NOTE — Patient Instructions (Addendum)
[facility] [phone] Please call them to schedule a follow-up  Foot Care, Adult Foot care is an important part of your health. Noticing and addressing any potential problems early is the best way to prevent future foot problems. How to care for your [place] your feet daily with warm water and mild soap. Do not use hot water. Then, pat your feet and the areas between your toes until they are completely dry. Do not soak your feet as this can dry your skin.  Trim your toenails straight across. Do not dig under them or around the cuticle. File the edges of your nails with an emery board or nail file.  On the skin on your feet and on dry, brittle nails, apply a moisturizing lotion or petroleum jelly that is unscented and does not contain alcohol. Do not apply lotion between your toes. Shoes and Socks   Wear clean socks or stockings every day. Make sure they are not too tight.  Wear shoes that fit properly and have enough cushioning. To break in new shoes, wear them for just a few hours a day. This prevents you from injuring your feet. Always look in your shoes before you put them on to be sure there are no objects inside. Wounds, Scrapes, Corns, and Calluses  Check your feet daily for blisters, cuts, and redness. If you cannot see the bottom of your feet, use a mirror or ask someone for help.  Do not cut corns or calluses. Do not try to remove them with medicine.  If you find a minor scrape, cut, or break in the skin on your feet, keep it and the skin around it clean and dry. These areas may be cleaned with mild soap and water. Do not clean the area with peroxide, alcohol, or iodine.  If you have a wound, scrape, corn, or callus on your foot, look at it several times a day to make sure it is healing and is not infected. Check for: ? More redness, swelling, or pain. ? More fluid or blood. ? Warmth. ? Pus or a bad smell. General Instructions  Do not cross your legs.  That may decrease the blood flow to your feet.  Do not use heating pads or hot water bottles on your feet. They may burn your skin. If you have lost feeling in your feet or legs, you may not know it is happening until it is too late.  Make sure your health care provider does a complete foot exam at least annually or more often if you have foot problems. If you have foot problems, report any cuts, sores, or bruises to your health care provider immediately. Contact a health care provider if:  You have a medical condition that increases your risk of infection and you have any cuts, sores, or bruises on your feet.  You have an injury that is not healing.  You notice redness on your legs or feet.  You feel burning or tingling in your legs or feet.  You have pain or cramps in your legs or feet.  Your legs or feet are numb.  Your feet always feel cold.  You have pain around a toenail. Get help right away if:  You have a wound, scrape, corn, or callus on your foot and: ? You have more redness, swelling, or pain. ? You have more fluid or blood. ? Your wound, scrape, corn, or callus feels warm to the touch. ? You have pus or a  bad smell coming from the wound, scrape, corn, or callus. ? You have a fever.  You have a red line going up your leg. This information is not intended to replace advice given to you by your health care provider. Make sure you discuss any questions you have with your health care provider. Document Released: 02/05/2016 Document Revised: 04/19/2016 Document Reviewed: 02/05/2016 Elsevier Interactive Patient Education  2018 [employer]. Sleep Apnea Sleep apnea is a condition that affects breathing. People with sleep apnea have moments during sleep when their breathing pauses briefly or gets shallow. Sleep apnea can cause these symptoms:  Trouble staying asleep.  Sleepiness or tiredness during the day.  Irritability.  Loud snoring.  Morning headaches.  Trouble  concentrating.  Forgetting things.  Less interest in sex.  Being sleepy for no reason.  Mood swings.  Personality changes.  Depression.  Waking up a lot during the night to pee (urinate).  Dry mouth.  Sore throat.  Follow these instructions at home:  Make any changes in your routine that your doctor recommends.  Eat a healthy, well-balanced diet.  Take over-the-counter and prescription medicines only as told by your doctor.  Avoid using alcohol, calming medicines (sedatives), and narcotic medicines.  Take steps to lose weight if you are overweight.  If you were given a machine (device) to use while you sleep, use it only as told by your doctor.  Do not use any tobacco products, such as cigarettes, chewing tobacco, and e-cigarettes. If you need help quitting, ask your doctor.  Keep all follow-up visits as told by your doctor. This is important. Contact a doctor if:  The machine that you were given to use during sleep is uncomfortable or does not seem to be working.  Your symptoms do not get better.  Your symptoms get worse. Get help right away if:  Your chest hurts.  You have trouble breathing in enough air (shortness of breath).  You have an uncomfortable feeling in your back, arms, or stomach.  You have trouble talking.  One side of your body feels weak.  A part of your face is hanging down (drooping). These symptoms may be an emergency. Do not wait to see if the symptoms will go away. Get medical help right away. Call your local emergency services (911 in the [place] Do not drive yourself to the hospital. This information is not intended to replace advice given to you by your health care provider. Make sure you discuss any questions you have with your health care provider. Document Released: 06/07/2008 Document Revised: 04/24/2016 Document Reviewed: 06/08/2015 Elsevier Interactive Patient Education  [employer].

## 2017-08-23 NOTE — Assessment & Plan Note (Addendum)
Continue Coreg, lipitor, NTG prn, Effient, ASA

## 2017-08-23 NOTE — Assessment & Plan Note (Signed)
Samples given of Dulera and Albuterol

## 2017-08-23 NOTE — Assessment & Plan Note (Signed)
Refill on CPAP machine

## 2017-08-23 NOTE — Telephone Encounter (Signed)
Darlina Guys at [facility] notified that order has been placed for CPAP. Hubbard Hartshorn, RN, BSN

## 2017-08-23 NOTE — Progress Notes (Signed)
   Subjective:    Patient ID: [name] [name] is a 56 y.o. male presenting with Diabetes  on 08/23/2017  HPI: Here today for 3 month f/u. Doing well. Taking meds as directed. Has not checked his BS in 1 wk due to loss of meter. Has new rx, but has not picked up . He does want a new CPAP machine. Treated recently for foot cellulitis. Told to get lotrimin for toes.  Review of Systems  Constitutional: Negative for chills and fever.  Respiratory: Negative for shortness of breath.   Cardiovascular: Negative for leg swelling.  Gastrointestinal: Negative for abdominal pain, nausea and vomiting.      Objective:    BP 128/64   Pulse 88   Temp 98.9 F (37.2 C) (Oral)   Ht 5\' 7"  (1.702 m)   Wt 291 lb (132 kg)   SpO2 96%   BMI 45.58 kg/m  Physical Exam  Constitutional: He appears well-developed and well-nourished. No distress.  HENT:  Head: Normocephalic and atraumatic.  Eyes: No scleral icterus.  Neck: Neck supple.  Cardiovascular: Normal rate.  Pulmonary/Chest: Effort normal.  Abdominal: Soft.  Musculoskeletal: He exhibits no edema.  Neurological: He is alert.  Skin: Skin is warm.  Psychiatric: He has a normal mood and affect.  Vitals reviewed. Has chronic fungal changes between toes with peeling skin and thickened toenails Normal sensation, temperature and pulses on foot exam.  Hemoglobin A1C 7.9       Assessment & Plan:   Problem List Items Addressed This Visit      Medium   Essential hypertension (Chronic)    BP at goal, continue lisinopril, coreg      OSA on CPAP (Chronic)    Refill on CPAP machine      CAD S/P percutaneous coronary angioplasty (Chronic)    Continue Coreg, lipitor, NTG prn, Effient, ASA      DM type 2 (diabetes mellitus, type 2) (HCC) - Primary    Continue Victoza, Insulin, Metformin--A1C is improving.      Relevant Medications   metFORMIN (GLUCOPHAGE) 1000 MG tablet   insulin aspart (NOVOLOG) 100 UNIT/ML injection   Other Relevant Orders    HgB A1c (Completed)   Urine Microalbumin w/creat. ratio     Unprioritized   COPD, moderate (HCC) (Chronic)    Samples given of Dulera and Albuterol      Relevant Medications   fluticasone furoate-vilanterol (BREO ELLIPTA) 200-25 MCG/INH AEPB   Other Relevant Orders   For home use only DME continuous positive airway pressure (CPAP)   Tinea pedis of both feet    Trial of Nystatin twice daily      Relevant Medications   nystatin (MYCOSTATIN/NYSTOP) powder      Total face-to-face time with patient: 25 minutes. Over 50% of encounter was spent on counseling and coordination of care. Return in about 3 months (around 11/21/2017).  [name] 08/23/2017 3:48 PM

## 2017-08-23 NOTE — Assessment & Plan Note (Signed)
Trial of Nystatin twice daily

## 2017-09-18 ENCOUNTER — Encounter: Payer: Self-pay | Admitting: Podiatry

## 2017-09-18 ENCOUNTER — Ambulatory Visit: Payer: Medicaid Other | Admitting: Podiatry

## 2017-09-18 DIAGNOSIS — B351 Tinea unguium: Secondary | ICD-10-CM | POA: Diagnosis not present

## 2017-09-18 DIAGNOSIS — M79674 Pain in right toe(s): Secondary | ICD-10-CM

## 2017-09-18 DIAGNOSIS — M79675 Pain in left toe(s): Secondary | ICD-10-CM | POA: Diagnosis not present

## 2017-09-18 DIAGNOSIS — E119 Type 2 diabetes mellitus without complications: Secondary | ICD-10-CM

## 2017-09-20 NOTE — Progress Notes (Signed)
Subjective:   Patient ID: [name] [name], male   DOB: 57 y.o.   MRN: [id number]   HPI Long-term diabetic with extreme obesity with thick yellow brittle nailbeds 1-5 both feet that he cannot cut   ROS      Objective:  Physical Exam  Neurovascular status unchanged with thick yellow brittle nailbeds 1-5 both feet that are painful     Assessment:  Mycotic nail infections with pain 1-5 both feet     Plan:  Debride painful nailbeds 1-5 both feet with no iatrogenic bleeding noted

## 2017-09-21 ENCOUNTER — Encounter: Payer: Self-pay | Admitting: Pharmacist

## 2017-09-21 ENCOUNTER — Ambulatory Visit: Payer: Medicaid Other | Admitting: Pharmacist

## 2017-09-21 DIAGNOSIS — Z720 Tobacco use: Secondary | ICD-10-CM | POA: Diagnosis not present

## 2017-09-21 DIAGNOSIS — E1159 Type 2 diabetes mellitus with other circulatory complications: Secondary | ICD-10-CM | POA: Diagnosis present

## 2017-09-21 DIAGNOSIS — Z794 Long term (current) use of insulin: Secondary | ICD-10-CM | POA: Diagnosis not present

## 2017-09-21 NOTE — Patient Instructions (Addendum)
Thanks for coming to see [place] today!   No changes to medications: continue taking lantus 60 units twice a day and novolog 50 units twice a day  Keep up the good work with not smoking!   Remember:  Take carvedilol twice a day Use Dulera 2 puffs twice a day  Come back to see [place] next month - NO SMOKING!!!!! Try to keep the candy at a minimum.

## 2017-09-21 NOTE — Progress Notes (Signed)
    S:     Chief Complaint  Patient presents with  . Medication Management    DM, smoking cessation   Patient arrives in good spirits and ambulating without assistance.  Presents for diabetes evaluation, education, and management. Patient is a longstanding patient of Rx Clinic.  Patient was last seen by Primary Care Provider on 08/23/2017. Patient requests samples of Breo and insulin, however patient has Medicaid.  Today patient reports breathing has been "fine", Reports improvement in breathing since stopping tobacco on 09/19/16. Denies recent use of albuterol inhaler. Reports strongest tobacco urges are around 4-5 PM, tries to stay busy during these times and refusing cigarettes when people offer them. Currently, using approx 5 mints, 5 Halls lozenges and sugar-free gum daily to manage cravings.  Patient reports diabetes was diagnosed in 2013. Patient reports fasting blood glucose 118 yesterday. States that meter is "not working" and flashing "E-9" on screen since this morning. Brings meter for review. Reports dizziness on occasion, denies falls.   Patient reports taking carvedilol 3.125mg  once daily, however sig is BID.  Family/Social History:   Patient reports adherence with medications except carvedilol (taking once daily) and Dulera (taking once daily). Did not give Lantus today as he was unable to check CBGs (meter not working) and he is afraid of hypoglycemia.  Current diabetes medications include: novolog 50 mg BID, lantus 60 BID, metformin 1000mg  BID, liraglutide 1.8mg  daily Current hypertension medications include: lisinopril 2.5mg  BID, carvedilol 3.125mg  daily (prescribed BID)  Patient denies hypoglycemic events.  Patient reported dietary habits: Eats 2 meals/day Brunch: chicken and broccoli with rice Dinner: same as above - sister cooks for him Drinks: 2% milk - 2 cups/day, diet sodas, water   O:  Physical Exam  Constitutional: He appears well-developed and well-nourished.       Review of Systems  Neurological: Positive for dizziness (happens at random).     Lab Results  Component Value Date   HGBA1C 7.9 08/23/2017   Vitals:   09/21/17 1350  BP: (!) 128/56  Pulse: 70  SpO2: 96%    2 hour post-prandial/random CBG: 187 in clinic Meter review: predominantly 100s, some 200s  A/P: Diabetes longstanding currently reasonably controlled as evidenced by meter review. Patient denies hypoglycemic events and is able to verbalize appropriate hypoglycemia management plan. Patient reports adherence with medication. Control is suboptimal due to non adherence to medications as a result of financial difficulty, dietary indiscretion and sedentary lifestyle. -Continue Lantus 60 BID and Novolog 50 BID -Continue metformin 1000 mg BID  -Continue Victoza 1.8 mg daily Next A1C anticipated March 2019.   ASCVD risk greater than 7.5%. Continued Aspirin 81 mg and Continued atorvastatin 80 mg.   Hypertension longstanding currently well controlled on medication. Patient denies adherence with medication.  -Continue current meds -Reinforced adherence to carvedilol 3.125mg  twice daily rather than once daily.  Tobacco use improved with use of mints and gum. Currently smoking <1 cigarette/day. Counseled on continuing tobacco cessation.  COPD with improved symptoms on Dulera 2 puffs once daily. Counseled on adherence to 2 puffs twice daily. No changes made to medication.  Written patient instructions provided.  Total time in face to face counseling 30 minutes. Follow up in 1 month. Patient seen with Onnie Boer, PharmD Candidate and Deirdre Pippins, PGY2 Pharmacy Resident, PharmD, BCPS.    Marland Kitchen

## 2017-09-25 NOTE — Assessment & Plan Note (Signed)
Tobacco use improved with use of mints and gum. Currently smoking <1 cigarette/day. Counseled on continuing tobacco cessation

## 2017-09-25 NOTE — Assessment & Plan Note (Signed)
Diabetes longstanding currently reasonably controlled as evidenced by meter review. Patient denies hypoglycemic events and is able to verbalize appropriate hypoglycemia management plan. Patient reports adherence with medication. Control is suboptimal due to non adherence to medications as a result of financial difficulty, dietary indiscretion and sedentary lifestyle. -Continue Lantus 60 BID and Novolog 50 BID -Continue metformin 1000 mg BID  -Continue Victoza 1.8 mg daily Next A1C anticipated March 2019.

## 2017-09-26 ENCOUNTER — Encounter: Payer: Self-pay | Admitting: Cardiology

## 2017-09-26 ENCOUNTER — Other Ambulatory Visit: Payer: Self-pay | Admitting: Family Medicine

## 2017-09-26 DIAGNOSIS — I2510 Atherosclerotic heart disease of native coronary artery without angina pectoris: Secondary | ICD-10-CM

## 2017-09-26 DIAGNOSIS — I10 Essential (primary) hypertension: Secondary | ICD-10-CM

## 2017-09-26 DIAGNOSIS — Z9861 Coronary angioplasty status: Principal | ICD-10-CM

## 2017-10-06 NOTE — Progress Notes (Signed)
10/12/2017 [name] [name]   [birth year]  [id number]  Primary Physician [name], MD Primary Cardiologist: [name]  HPI:  [name] [name] is seen today for follow up CAD. His past medical history is significant for CAD, DM, obesity, tobacco use , hyperlipidemia and hypertension. He is status post stenting of the right coronary artery in May of 2013. He was admitted with a NSTEMI inFebruary of 2015. He was found to have severe disease in LCx and OM2 treated with BMS.  He was placed on dual antiplatelet therapy with aspirin plus Effient.   He presented with increased angina in October 2015. A Myoview study was intermediate risk.  He had repeat cardiac cath in November 2015.  He was found to have severe single-vessel disease involving the bifurcation of OM1 and OM 2 with 99% in-stent restenosis in the bare-metal stent placed in the OM 2. He underwent successful PCI spanning the proximal circumflex stent across OM 1 through the bare-metal stent placed in OM 2 with a Xience alpine DES stent. He was also noted to have a widely patent stent in the RCA and proximal circumflex with otherwise mild-moderate disease. He has preserved LVEF with an ejection fraction of 55-65%. He was instructed to continue with DAPT with ASA + Effient.   On follow up today he reports he is doing  well. He denies any chest pain or SOB. He is smoking very little now. Tries to chew gum or use breath mints instead.   Last A1c improved from 8.3>>7.9%. .    Current Outpatient Medications  Medication Sig Dispense Refill  . ACCU-CHEK FASTCLIX LANCETS MISC 1 Units by Percutaneous route 4 (four) times daily. 100 each 12  . albuterol (PROVENTIL HFA;VENTOLIN HFA) 108 (90 Base) MCG/ACT inhaler Inhale 2 puffs every 6 (six) hours as needed into the lungs for wheezing or shortness of breath. 1 Inhaler 1  . aspirin 81 MG chewable tablet Chew 1 tablet (81 mg total) by mouth daily. 180 tablet 2  . atorvastatin (LIPITOR) 80 MG tablet TAKE 1  TABLET (80 MG TOTAL) BY MOUTH DAILY. (Patient taking differently: TAKE 1 TABLET (80 MG TOTAL) BY MOUTH EVERY EVENING) 90 tablet 4  . B-D INS SYR ULTRAFINE 1CC/31G 31G X 5/16" 1 ML MISC INJECT 5 TIMES DAILY AS DIRECTED. 100 each 2  . BD ULTRA-FINE PEN NEEDLES 29G X 12.7MM MISC USE TO WITH VICTOZA PEN, INJECTION ONCE DAILY. 100 each 10  . Blood Glucose Monitoring Suppl (ACCU-CHEK AVIVA PLUS) w/Device KIT CHECK BLOOD GLUCOSE FASTING AND 2 HOURS AFTER BREAKFAST, LUNCH AND DINNER 1 kit 0  . carvedilol (COREG) 3.125 MG tablet TAKE 1 TABLET BY MOUTH TWO TIMES A DAY WITH A MEAL 60 tablet 0  . citalopram (CELEXA) 20 MG tablet Take 1 tablet (20 mg total) by mouth daily. (Patient taking differently: Take 20 mg by mouth every evening. ) 30 tablet 12  . fluticasone furoate-vilanterol (BREO ELLIPTA) 200-25 MCG/INH AEPB Inhale 1 puff into the lungs daily. 1 each 0  . glucose blood (ACCU-CHEK AVIVA) test strip Use test blood sugar 4 times a day. ICD-10 code: E11.9 100 each 12  . insulin aspart (NOVOLOG) 100 UNIT/ML injection Inject 50 Units into the skin 2 (two) times daily before a meal. 10 mL 0  . insulin glargine (LANTUS) 100 UNIT/ML injection Inject 0.6 mLs (60 Units total) 2 (two) times daily into the skin. 4 vial 5  . lisinopril (PRINIVIL,ZESTRIL) 2.5 MG tablet Take 1 tablet (2.5 mg total) by mouth  2 (two) times daily. (Patient taking differently: Take 5 mg by mouth daily. ) 180 tablet 3  . metFORMIN (GLUCOPHAGE) 1000 MG tablet Take 1 tablet (1,000 mg total) by mouth 2 (two) times daily with a meal. 60 tablet 11  . mometasone-formoterol (DULERA) 200-5 MCG/ACT AERO Inhale 2 puffs into the lungs 2 (two) times daily. 1 Inhaler 11  . Multiple Vitamin (MULTIVITAMIN) tablet Take 1 tablet by mouth daily.    . nitroGLYCERIN (NITROSTAT) 0.4 MG SL tablet PLACE 1 TABLET UNDER THE TONGUE EVERY 5 MINUTES FOR 3 DOSES AS NEEDED FOR CHEST PAIN *IF NO RELIEF, CALL 911* 100 tablet 2  . nystatin (MYCOSTATIN/NYSTOP) powder Apply  topically 2 (two) times daily. 60 g 5  . Omega-3 Fatty Acids (FISH OIL PO) Take 1,000 mg by mouth 2 (two) times daily.     . prasugrel (EFFIENT) 10 MG TABS tablet Take 1 tablet (10 mg total) by mouth daily. 90 tablet 3  . traMADol (ULTRAM) 50 MG tablet TAKE ONE TABLET BY MOUTH EVERY 6 HOURS AS NEEDED 20 tablet 1  . VICTOZA 18 MG/3ML SOPN INJECT 0.3 MLS (1.8 MG TOTAL) INTO THE SKIN DAILY 9 pen 10   No current facility-administered medications for this visit.     Allergies  Allergen Reactions  . Shellfish Allergy Nausea And Vomiting    OYSTERS    Social History   Socioeconomic History  . Marital status: Single    Spouse name: Not on file  . Number of children: 0  . Years of education: 51  . Highest education level: Not on file  Social Needs  . Financial resource strain: Not on file  . Food insecurity - worry: Not on file  . Food insecurity - inability: Not on file  . Transportation needs - medical: Not on file  . Transportation needs - non-medical: Not on file  Occupational History  . Occupation: Unemployed    Comment: Used to work for [employer]  . Occupation: Ship broker, Human resources officer  Tobacco Use  . Smoking status: Light Tobacco Smoker    Packs/day: 1.00    Years: 37.00    Pack years: 37.00    Types: Cigarettes    Last attempt to quit: 12/17/2016    Years since quitting: 0.8  . Smokeless tobacco: Never Used  . Tobacco comment: relapsed briefly in early 2018 - Quit in 4/18  Substance and Sexual Activity  . Alcohol use: Yes    Alcohol/week: 3.6 oz    Types: 6 Cans of beer per week    Comment: pt reports "I quit 66 days ago" (05/09/16)  . Drug use: No    Comment: Crack Cocaine (denies 05/09/16)  . Sexual activity: Not Currently  Other Topics Concern  . Not on file  Social History Narrative   Single.  Lives with a roommate.  Ambulates independently.     Review of Systems: AS noted in HPI All other systems reviewed and are otherwise negative except as  noted above.  Blood pressure 120/72, pulse 78, height '5\' 7"'  (1.702 m), weight 297 lb 6.4 oz (134.9 kg).  GENERAL:  Well appearing, obese WM in NAD HEENT:  PERRL, EOMI, sclera are clear. Oropharynx is clear. Hard of hearing. NECK:  No jugular venous distention, carotid upstroke brisk and symmetric, no bruits, no thyromegaly or adenopathy LUNGS:  Clear to auscultation bilaterally CHEST:  Unremarkable HEART:  RRR,  PMI not displaced or sustained,S1 and S2 within normal limits, no S3, no S4: no  clicks, no rubs, no murmurs ABD:  Soft, nontender. BS +, no masses or bruits. No hepatomegaly, no splenomegaly EXT:  2 + pulses throughout, no edema, no cyanosis no clubbing SKIN:  Warm and dry.  No rashes NEURO:  Alert and oriented x 3. Cranial nerves II through XII intact. PSYCH:  Cognitively intact    Laboratory data:  Lab Results  Component Value Date   WBC 10.7 (H) 11/07/2016   HGB 12.3 (L) 11/07/2016   HCT 36.8 (L) 11/07/2016   PLT 243 11/07/2016   GLUCOSE 105 (H) 04/03/2017   CHOL 117 04/03/2017   TRIG 187 (H) 04/03/2017   HDL 33 (L) 04/03/2017   LDLCALC 47 04/03/2017   ALT 14 04/03/2017   AST 19 04/03/2017   NA 140 04/03/2017   K 4.5 04/03/2017   CL 99 04/03/2017   CREATININE 0.79 04/03/2017   BUN 10 04/03/2017   CO2 26 04/03/2017   TSH 1.100 01/18/2012   INR 0.96 07/28/2014   HGBA1C 7.9 08/23/2017   MICROALBUR 4.7 05/23/2016   Ecg today shows NSR with normal Ecg. I have personally reviewed and interpreted this study.   ASSESSMENT AND PLAN:   1. CAD: Status post repeat PCI to the proximal circumflex and OM 2 in Nov. 2015 as outlined above with DES for instent restenosis. He is asymptomatic.  Continue dual antiplatelet therapy with aspirin plus Effient indefinitely due to extensive nature of stents.  Continue beta blocker, statin and ACE inhibitor for secondary prevention.  2. Hypertension: Blood pressure is well-controlled. Continue lisinopril and Coreg  3.  Hyperlipidemia: on statin. Lipids under good control.  4. Diabetes: per primary care. Encourage weight loss.  5. Tobacco abuse: recommend complete smoking cessation.  I will follow up in 6 months with fasting labs..  Peter Martinique MD,FACC  10/12/2017 4:15 PM

## 2017-10-12 ENCOUNTER — Ambulatory Visit: Payer: Medicaid Other | Admitting: Cardiology

## 2017-10-12 ENCOUNTER — Encounter: Payer: Self-pay | Admitting: Cardiology

## 2017-10-12 VITALS — BP 120/72 | HR 78 | Ht 67.0 in | Wt 297.4 lb

## 2017-10-12 DIAGNOSIS — Z9861 Coronary angioplasty status: Secondary | ICD-10-CM

## 2017-10-12 DIAGNOSIS — Z794 Long term (current) use of insulin: Secondary | ICD-10-CM

## 2017-10-12 DIAGNOSIS — I2510 Atherosclerotic heart disease of native coronary artery without angina pectoris: Secondary | ICD-10-CM

## 2017-10-12 DIAGNOSIS — E1159 Type 2 diabetes mellitus with other circulatory complications: Secondary | ICD-10-CM | POA: Diagnosis not present

## 2017-10-12 DIAGNOSIS — I10 Essential (primary) hypertension: Secondary | ICD-10-CM

## 2017-10-12 NOTE — Addendum Note (Signed)
Addended by: Zebedee Iba on: 10/12/2017 04:31 PM   Modules accepted: Orders

## 2017-10-12 NOTE — Patient Instructions (Signed)
Continue your current therapy  Stop smoking completely  Work on losing weight  I will see you in 6 months.

## 2017-10-24 ENCOUNTER — Other Ambulatory Visit: Payer: Self-pay | Admitting: Family Medicine

## 2017-10-24 DIAGNOSIS — I10 Essential (primary) hypertension: Secondary | ICD-10-CM

## 2017-10-24 DIAGNOSIS — I2510 Atherosclerotic heart disease of native coronary artery without angina pectoris: Secondary | ICD-10-CM

## 2017-10-24 DIAGNOSIS — Z9861 Coronary angioplasty status: Principal | ICD-10-CM

## 2017-10-26 ENCOUNTER — Encounter: Payer: Self-pay | Admitting: Pharmacist

## 2017-10-26 ENCOUNTER — Ambulatory Visit: Payer: Medicaid Other | Admitting: Pharmacist

## 2017-10-26 DIAGNOSIS — E1159 Type 2 diabetes mellitus with other circulatory complications: Secondary | ICD-10-CM

## 2017-10-26 DIAGNOSIS — Z794 Long term (current) use of insulin: Secondary | ICD-10-CM | POA: Diagnosis not present

## 2017-10-26 DIAGNOSIS — Z720 Tobacco use: Secondary | ICD-10-CM

## 2017-10-26 MED ORDER — INSULIN ASPART 100 UNIT/ML ~~LOC~~ SOLN: 50.0000 [IU] | Freq: Two times a day (BID) | SUBCUTANEOUS | 3 refills | Status: DC

## 2017-10-26 MED ORDER — BUPROPION HCL ER (XL) 150 MG PO TB24: 150.0000 mg | ORAL_TABLET | Freq: Every day | ORAL | 1 refills | Status: DC

## 2017-10-26 MED ORDER — INSULIN ASPART 100 UNIT/ML ~~LOC~~ SOLN: 50.0000 [IU] | Freq: Two times a day (BID) | SUBCUTANEOUS | 0 refills | Status: DC

## 2017-10-26 NOTE — Progress Notes (Signed)
    S:     Chief Complaint  Patient presents with  . Medication Management    T2DM   Patient arrives boisterous and in good spirits ambulating without assistance. Presents for diabetes evaluation, education, and management. Patient is longstanding patient of Rx clinic. Patient was last seen by Primary Care Provider Dr. Kennon Rounds on 08/23/17.   Patient reports abstaining from tobacco on "some days" and uses cigarettes on other days. Reports using candy, gum and toothpicks to manage oral fixation. States fasting CBGs from 120-147 and a post prandial high of 214 two weeks ago.  States breathing is currently "ok". Currently on Dulera 200-5 BID.  Patient reports Diabetes was diagnosed in 2013.   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  Current diabetes medications include: novolog 50 units BID, lantus 60 units BID, metformin 1000mg  BID, victoza 1.8mg  daily Current hypertension medications include: lisinopril 2.5mg  BID, carvedilol 2.5 mg BID  Patient denies recent hypoglycemic events. Last reported 2-3 months prior to visit.   Patient reported dietary habits: Eats 2 meals/day  Patient reported exercise habits: currently walking 15 minutes 3x/week.Ladon Applebaum getting a gym membership once disability decision is made.    Patient reports intermittent nocturia.  Patient denies neuropathy. Patient reports visual changes (dark spots) and is scheduled to see eye doctor in July.  Patient reports self foot exams.   O:  Physical Exam  Constitutional: He appears well-developed and well-nourished.     Review of Systems  All other systems reviewed and are negative.    Lab Results  Component Value Date   HGBA1C 7.9 08/23/2017   There were no vitals filed for this visit.  A/P: Diabetes longstanding currently uncontrolled per A1C 7.9% on 08/23/17. Patient reports hypoglycemic events and is able  to verbalize appropriate hypoglycemia management plan. Patient reports adherence with medication. Control is suboptimal due to obesity and limited exercise.  Medication Samples have been provided to the patient.   Drug name: Novolog        Strength: 100 units/mL        Qty: 1  LOT: [id number]  Exp.Date: 04/12/2019  Continued intermittent tobacco use (0-5 cigarettes daily). Start bupropion XL (24h) 150 mg one daily for one week. Titrate to 300 mg daily as tolerated.    Next A1C anticipated 11/2017    ASCVD risk greater than 7.5%. Continued Aspirin 81 mg and Continued atorvastatin 80 mg daily.   Hypertension longstanding currently controlled per in clinic reading 124/70.  Patient reports adherence with medication.  Written patient instructions provided.  Total time in face to face counseling 30 minutes.   Follow up in Pharmacist Clinic Visit in 8 weeks.   Patient seen with Onnie Boer, PharmD Candidate, Leroy Libman, PharmD, and Deirdre Pippins, PGY2 Pharmacy Resident, PharmD, BCPS.

## 2017-10-26 NOTE — Patient Instructions (Addendum)
It was great seeing you today!  Continue taking lantus 60 units twice a day and novolog 50 units twice a day.  START taking Wellbutrin XL 150 mg (bupropion) once daily in the morning for one week to help reduce your tobacco cravings.Then increase your dose to two tablets daily (300mg  total) and stay at that dose as long as you tolerate it.  Follow up with Rx clinic in April.

## 2017-10-26 NOTE — Assessment & Plan Note (Signed)
Continued intermittent tobacco use (0-5 cigarettes daily). Start bupropion XL (24h) 150 mg one daily for one week. Titrate to 300 mg daily as tolerated.  (Next PCP visit consider prescription for 300mg  XL dose).

## 2017-10-26 NOTE — Progress Notes (Signed)
Patient ID: [name] [name], male   DOB: 04/04/1961, 56 y.o.   MRN: [id number] Reviewed: Agree with Dr. Koval's documentation and management. 

## 2017-10-26 NOTE — Assessment & Plan Note (Signed)
Diabetes longstanding currently uncontrolled per A1C 7.9% on 08/23/17. Patient reports hypoglycemic events and is able to verbalize appropriate hypoglycemia management plan. Patient reports adherence with medication. Control is suboptimal due to obesity and limited exercise.  Medication Samples have been provided to the patient.   Drug name: Novolog        Strength: 100 units/mL        Qty: 1  LOT: [id number]  Exp.Date: 04/12/2019

## 2017-11-16 ENCOUNTER — Encounter: Payer: Self-pay | Admitting: Family Medicine

## 2017-11-16 ENCOUNTER — Other Ambulatory Visit: Payer: Self-pay | Admitting: *Deleted

## 2017-11-16 ENCOUNTER — Other Ambulatory Visit: Payer: Self-pay

## 2017-11-16 ENCOUNTER — Telehealth: Payer: Self-pay | Admitting: Family Medicine

## 2017-11-16 ENCOUNTER — Ambulatory Visit: Payer: Medicaid Other | Admitting: Family Medicine

## 2017-11-16 VITALS — BP 140/70 | HR 76 | Temp 98.6°F | Ht 67.0 in | Wt 295.0 lb

## 2017-11-16 DIAGNOSIS — J449 Chronic obstructive pulmonary disease, unspecified: Secondary | ICD-10-CM | POA: Diagnosis not present

## 2017-11-16 DIAGNOSIS — E1159 Type 2 diabetes mellitus with other circulatory complications: Secondary | ICD-10-CM

## 2017-11-16 DIAGNOSIS — I214 Non-ST elevation (NSTEMI) myocardial infarction: Secondary | ICD-10-CM | POA: Diagnosis not present

## 2017-11-16 DIAGNOSIS — M545 Low back pain, unspecified: Secondary | ICD-10-CM

## 2017-11-16 DIAGNOSIS — I10 Essential (primary) hypertension: Secondary | ICD-10-CM | POA: Diagnosis not present

## 2017-11-16 DIAGNOSIS — Z794 Long term (current) use of insulin: Secondary | ICD-10-CM

## 2017-11-16 DIAGNOSIS — Z720 Tobacco use: Secondary | ICD-10-CM | POA: Diagnosis not present

## 2017-11-16 LAB — POCT GLYCOSYLATED HEMOGLOBIN (HGB A1C): HEMOGLOBIN A1C: 7.4

## 2017-11-16 MED ORDER — MOMETASONE FURO-FORMOTEROL FUM 200-5 MCG/ACT IN AERO: 2.0000 | INHALATION_SPRAY | Freq: Two times a day (BID) | RESPIRATORY_TRACT | 11 refills | Status: DC

## 2017-11-16 MED ORDER — NITROGLYCERIN 0.4 MG SL SUBL: SUBLINGUAL_TABLET | SUBLINGUAL | 2 refills | Status: DC

## 2017-11-16 MED ORDER — TRAMADOL HCL 50 MG PO TABS: 50.0000 mg | ORAL_TABLET | Freq: Four times a day (QID) | ORAL | 1 refills | Status: DC | PRN

## 2017-11-16 MED ORDER — INSULIN GLARGINE 100 UNIT/ML ~~LOC~~ SOLN: 60.0000 [IU] | Freq: Two times a day (BID) | SUBCUTANEOUS | 5 refills | Status: DC

## 2017-11-16 MED ORDER — VARENICLINE TARTRATE 1 MG PO TABS: 1.0000 mg | ORAL_TABLET | Freq: Two times a day (BID) | ORAL | 0 refills | Status: DC

## 2017-11-16 NOTE — Assessment & Plan Note (Addendum)
BP at goal. Continue lisinopril and coreg

## 2017-11-16 NOTE — Assessment & Plan Note (Signed)
Glucose control improved. Sees foot doctor next month. Continue Victoza, insulin, metformin

## 2017-11-16 NOTE — Telephone Encounter (Signed)
Pt states the Tramadol Rx was not faxed to [facility] like the other ones were. Please advise

## 2017-11-16 NOTE — Assessment & Plan Note (Signed)
Continue Chantix, encouraged smoking cessation

## 2017-11-16 NOTE — Telephone Encounter (Signed)
Patient requesting samples of breo and novolog pens.  1 of each given.  Breo Lot# [id number], exp 04/31/2020 and [facility] 2952-8413-24.  Novolog lot# [id number], exp 04/11/2018 and [place] [id number]. Georgie Eduardo,CMA

## 2017-11-16 NOTE — Progress Notes (Signed)
   Subjective:    Patient ID: [name] [name] is a 57 y.o. male presenting with Diabetes  on 11/16/2017  HPI: [place] off the porch due to a dog pulling on it.  Has Albuterol and states inhaler goes down by 20 puffs with one puff. Chantix worked well for him. Still smoking occasionally. Reports CBGs are good.  Review of Systems  Constitutional: Negative for chills and fever.  Respiratory: Negative for shortness of breath.   Cardiovascular: Negative for leg swelling.  Gastrointestinal: Negative for abdominal pain, nausea and vomiting.      Objective:    BP 140/70   Pulse 76   Temp 98.6 F (37 C) (Oral)   Ht 5\' 7"  (1.702 m)   Wt 295 lb (133.8 kg)   SpO2 95%   BMI 46.20 kg/m  Physical Exam  Constitutional: He appears well-developed and well-nourished. No distress.  HENT:  Head: Normocephalic and atraumatic.  Eyes: No scleral icterus.  Neck: Neck supple.  Cardiovascular: Normal rate.  Pulmonary/Chest: Effort normal.  Abdominal: Soft.  Musculoskeletal: He exhibits no edema.  Neurological: He is alert.  Skin: Skin is warm.  Psychiatric: He has a normal mood and affect.  Vitals reviewed.  A1C is 7.4    Assessment & Plan:   Problem List Items Addressed This Visit      High   H/O NSTEMI (non-ST elevated myocardial infarction) (Chronic)    Refilled his NTG. Continue Coreg, lipitor      Relevant Medications   nitroGLYCERIN (NITROSTAT) 0.4 MG SL tablet     Medium   Essential hypertension (Chronic)    BP at goal. Continue lisinopril and coreg      Relevant Medications   nitroGLYCERIN (NITROSTAT) 0.4 MG SL tablet   DM type 2 (diabetes mellitus, type 2) (HCC) - Primary    Glucose control improved. Sees foot doctor next month. Continue Victoza, insulin, metformin      Relevant Medications   insulin glargine (LANTUS) 100 UNIT/ML injection   Other Relevant Orders   HgB A1c   Tobacco abuse    Continue Chantix, encouraged smoking cessation      Relevant Medications   varenicline (CHANTIX CONTINUING MONTH PAK) 1 MG tablet     Unprioritized   COPD, moderate (HCC) (Chronic)    Continue breo, albuterol      Relevant Medications   varenicline (CHANTIX CONTINUING MONTH PAK) 1 MG tablet   mometasone-formoterol (DULERA) 200-5 MCG/ACT AERO    Other Visit Diagnoses    Bilateral low back pain without sciatica       have refilled Ultram--rarely uses.   Relevant Medications   traMADol (ULTRAM) 50 MG tablet      Total face-to-face time with patient: 25 minutes. Over 50% of encounter was spent on counseling and coordination of care. Return in about 3 months (around 02/16/2018).  [name] 11/16/2017 1:57 PM

## 2017-11-16 NOTE — Assessment & Plan Note (Signed)
Refilled his NTG. Continue Coreg, lipitor

## 2017-11-16 NOTE — Telephone Encounter (Signed)
Medication called into pharmacy for patient. Jazmin Hartsell,CMA

## 2017-11-16 NOTE — Patient Instructions (Signed)

## 2017-11-16 NOTE — Assessment & Plan Note (Signed)
Continue breo, albuterol

## 2017-11-22 ENCOUNTER — Other Ambulatory Visit: Payer: Self-pay | Admitting: Family Medicine

## 2017-11-22 DIAGNOSIS — I10 Essential (primary) hypertension: Secondary | ICD-10-CM

## 2017-11-22 DIAGNOSIS — I2510 Atherosclerotic heart disease of native coronary artery without angina pectoris: Secondary | ICD-10-CM

## 2017-11-22 DIAGNOSIS — Z9861 Coronary angioplasty status: Principal | ICD-10-CM

## 2017-11-29 ENCOUNTER — Telehealth: Payer: Self-pay

## 2017-11-29 NOTE — Telephone Encounter (Signed)
Pt left message on nurse line, states he was prescribed 2 different medications to help him quit smoking. Unsure if he is to take them both. Attempted to call patient back, no answer. Left message to return call. Wallace Cullens, RN

## 2017-11-29 NOTE — Telephone Encounter (Signed)
Pt wanting to make sure ok to take both bupriopion and chantix, advised both are fine to take. Wallace Cullens, RN

## 2017-12-18 ENCOUNTER — Encounter: Payer: Self-pay | Admitting: Podiatry

## 2017-12-18 ENCOUNTER — Ambulatory Visit: Payer: Medicaid Other | Admitting: *Deleted

## 2017-12-18 DIAGNOSIS — B351 Tinea unguium: Secondary | ICD-10-CM | POA: Diagnosis not present

## 2017-12-18 DIAGNOSIS — M79675 Pain in left toe(s): Secondary | ICD-10-CM | POA: Diagnosis not present

## 2017-12-18 DIAGNOSIS — E119 Type 2 diabetes mellitus without complications: Secondary | ICD-10-CM

## 2017-12-18 DIAGNOSIS — M79674 Pain in right toe(s): Secondary | ICD-10-CM | POA: Diagnosis not present

## 2017-12-18 NOTE — Progress Notes (Signed)
Subjective:   Patient ID: [name] [name], male   DOB: 57 y.o.   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

## 2017-12-24 ENCOUNTER — Other Ambulatory Visit: Payer: Self-pay | Admitting: Family Medicine

## 2017-12-24 DIAGNOSIS — Z9861 Coronary angioplasty status: Principal | ICD-10-CM

## 2017-12-24 DIAGNOSIS — Z720 Tobacco use: Secondary | ICD-10-CM

## 2017-12-24 DIAGNOSIS — I2510 Atherosclerotic heart disease of native coronary artery without angina pectoris: Secondary | ICD-10-CM

## 2017-12-24 DIAGNOSIS — I10 Essential (primary) hypertension: Secondary | ICD-10-CM

## 2017-12-25 ENCOUNTER — Encounter: Payer: Self-pay | Admitting: *Deleted

## 2017-12-25 ENCOUNTER — Ambulatory Visit: Payer: Medicaid Other | Admitting: Pharmacist

## 2017-12-25 ENCOUNTER — Encounter: Payer: Self-pay | Admitting: Pharmacist

## 2017-12-25 DIAGNOSIS — Z794 Long term (current) use of insulin: Secondary | ICD-10-CM | POA: Diagnosis not present

## 2017-12-25 DIAGNOSIS — E1159 Type 2 diabetes mellitus with other circulatory complications: Secondary | ICD-10-CM

## 2017-12-25 DIAGNOSIS — Z720 Tobacco use: Secondary | ICD-10-CM

## 2017-12-25 MED ORDER — EMPAGLIFLOZIN 10 MG PO TABS: 10.0000 mg | ORAL_TABLET | Freq: Every day | ORAL | 1 refills | Status: DC

## 2017-12-25 NOTE — Progress Notes (Signed)
S:    Chief Complaint  Patient presents with  . Medication Management    DM, smoking cessation    Patient arrives in good spirits and ambulating independenlty.  Presents for diabetes evaluation, education, and management and smoking cessation at the request of Dr. Kennon Rounds. Patient was referred on 11/16/17 .  Patient was last seen by Primary Care Provider on 11/16/17 and last seen by Rx clinic on 10/26/17.  Patient was excited today as his disability court date is now set for early July (in less than 3 months).    Family/Social History: Is currently unemployed but working with a Chief Executive Officer to get approval for disability.  At the last visit (10/26/17), the patient was still smoking 0 - 5 cigarettes a day.  However, patient states he has been able to quit for the last 4 days.  Longest time ever been tobacco free includes a prior 108 day streak. Current smoking cessation medications include: Chantix 1 mg twice daily and bupropion 150 mg XL once daily. Most common triggers to use tobacco include; boredom, stress, and weather changes (harder to quit in the summer because he stands on a street corner more then which triggers his smoking habit). Currently the patient utilizes mint candies to lessen smoking urges as needed.  Patient reports adherence with medications.  Current diabetes medications include: Lantus 60 units twice daily, Novolog 50 units twice daily before meals, Victoza 1.8 mg once daily, metformin 1000 mg twice daily.    Current hypertension medications include: carvedilol 3.125 mg twice daily and lisinopril 2.5 mg twice daily. Patient denies hypoglycemic events. Patient reported dietary habits: Eats 2 meals/day. Meals include eating out at restaurants such as [employer] and [employer].  Patient reported exercise habits: None at the time but mentions that after he gets approved for disability checks he will be to join a [employer] due to having a proof of income.    O:  Lab Results    Component Value Date   HGBA1C 7.4 11/16/2017   Vitals:   12/25/17 1123  BP: 110/70  Pulse: 71  SpO2: 96%    Home fasting CBG: ~111 mg/dL   10-year ASCVD risk not available for patients with LDL-C < 70 mg/dL.    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  Continued Chantix and bupropion for smoking cessation. Consider dose titration of bupropion to 300 mg XL once daily at next visit if patient relapses and resumes smoking. Patient counseled on continued adherence.  Diabetes longstanding currently well controlled and at goal. However, the patient's current insulin regimen is high with a total daily dose > than 200 units of insulin. White the patient denies hypoglycemic events and is able to verbalize appropriate hypoglycemia management plan, goal would be to decrease insulin requirements through additional non-insulin T2DM therapy. Patient reports adherence with medication.  Started Jardiance 10 mg once daily.  Decreased Lantus from 60 units to 50 units twice daily.  Continued Novolog 50 units twice daily before meals. Continued metformin.  Next A1C anticipated June 2019.    Continued aspirin 81 mg and atorvastatin 80 mg once daily. Last lipid panel in 2016 demonstrated LDL level is at goal in response to therapy.  Hypertension longstanding currently well controlled.  Patient reports adherence with medication.   Written patient instructions provided.  Total time in face to face counseling 30 minutes.   Follow up in Pharmacist  Clinic Visit 1 month.   Patient seen with Hildred Alamin, PharmD Candidate and Bertis Ruddy, PharmD, PGY1 Resident

## 2017-12-25 NOTE — Patient Instructions (Addendum)
It was good to see you today, Keep taking Chantix and bupropion to help you stop smoking. Start taking Jardiance 10mg  once daily when you get back into town, and only when you start this new medication reduce your lantus to 50 units twice daily, and continue novolog 50 units twice daily.   Next Rx visit in 1 month

## 2017-12-25 NOTE — Assessment & Plan Note (Signed)
Diabetes longstanding currently well controlled and at goal. However, the patient's current insulin regimen is high with a total daily dose > than 200 units of insulin. White the patient denies hypoglycemic events and is able to verbalize appropriate hypoglycemia management plan, goal would be to decrease insulin requirements through additional non-insulin T2DM therapy. Patient reports adherence with medication.  Started Jardiance 10 mg once daily.  Decreased Lantus from 60 units to 50 units twice daily.  Continued Novolog 50 units twice daily before meals. Continued metformin.  Next A1C anticipated June 2019.

## 2017-12-25 NOTE — Assessment & Plan Note (Signed)
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  Continued Chantix and bupropion for smoking cessation. Consider dose titration of bupropion to 300 mg XL once daily at next visit if patient relapses and resumes smoking. Patient counseled on continued adherence.

## 2017-12-26 ENCOUNTER — Other Ambulatory Visit: Payer: Self-pay | Admitting: Family Medicine

## 2018-01-09 ENCOUNTER — Other Ambulatory Visit: Payer: Self-pay | Admitting: Family Medicine

## 2018-01-09 MED ORDER — GLUCOSE BLOOD VI STRP: ORAL_STRIP | 11 refills | Status: DC

## 2018-01-09 NOTE — Telephone Encounter (Signed)
Test strips sent to pharmacy. Jazmin Hartsell,CMA

## 2018-01-09 NOTE — Telephone Encounter (Signed)
Needs test strips so he can take two shots a day.  Pharmacy says he needs authorization for it.  [facility] on [address].

## 2018-01-22 ENCOUNTER — Other Ambulatory Visit: Payer: Self-pay | Admitting: Family Medicine

## 2018-01-22 DIAGNOSIS — Z9861 Coronary angioplasty status: Principal | ICD-10-CM

## 2018-01-22 DIAGNOSIS — I10 Essential (primary) hypertension: Secondary | ICD-10-CM

## 2018-01-22 DIAGNOSIS — I2510 Atherosclerotic heart disease of native coronary artery without angina pectoris: Secondary | ICD-10-CM

## 2018-01-22 DIAGNOSIS — Z720 Tobacco use: Secondary | ICD-10-CM

## 2018-01-30 ENCOUNTER — Other Ambulatory Visit: Payer: Self-pay | Admitting: Family Medicine

## 2018-01-30 DIAGNOSIS — Z9861 Coronary angioplasty status: Principal | ICD-10-CM

## 2018-01-30 DIAGNOSIS — I2510 Atherosclerotic heart disease of native coronary artery without angina pectoris: Secondary | ICD-10-CM

## 2018-01-30 DIAGNOSIS — E785 Hyperlipidemia, unspecified: Secondary | ICD-10-CM

## 2018-02-02 ENCOUNTER — Ambulatory Visit: Payer: Medicaid Other | Admitting: Pharmacist

## 2018-02-02 ENCOUNTER — Encounter: Payer: Self-pay | Admitting: Pharmacist

## 2018-02-02 DIAGNOSIS — Z720 Tobacco use: Secondary | ICD-10-CM | POA: Diagnosis not present

## 2018-02-02 DIAGNOSIS — I10 Essential (primary) hypertension: Secondary | ICD-10-CM

## 2018-02-02 DIAGNOSIS — Z794 Long term (current) use of insulin: Secondary | ICD-10-CM | POA: Diagnosis not present

## 2018-02-02 DIAGNOSIS — I214 Non-ST elevation (NSTEMI) myocardial infarction: Secondary | ICD-10-CM

## 2018-02-02 DIAGNOSIS — Z9861 Coronary angioplasty status: Secondary | ICD-10-CM

## 2018-02-02 DIAGNOSIS — J449 Chronic obstructive pulmonary disease, unspecified: Secondary | ICD-10-CM

## 2018-02-02 DIAGNOSIS — E1159 Type 2 diabetes mellitus with other circulatory complications: Secondary | ICD-10-CM

## 2018-02-02 DIAGNOSIS — I2510 Atherosclerotic heart disease of native coronary artery without angina pectoris: Secondary | ICD-10-CM | POA: Diagnosis not present

## 2018-02-02 MED ORDER — INSULIN GLARGINE 100 UNIT/ML ~~LOC~~ SOLN: 45.0000 [IU] | Freq: Two times a day (BID) | SUBCUTANEOUS | 5 refills | Status: DC

## 2018-02-02 MED ORDER — NITROGLYCERIN 0.4 MG SL SUBL: SUBLINGUAL_TABLET | SUBLINGUAL | 2 refills | Status: DC

## 2018-02-02 MED ORDER — INSULIN ASPART 100 UNIT/ML ~~LOC~~ SOLN: 45.0000 [IU] | Freq: Two times a day (BID) | SUBCUTANEOUS | 3 refills | Status: DC

## 2018-02-02 MED ORDER — EMPAGLIFLOZIN 25 MG PO TABS: 25.0000 mg | ORAL_TABLET | Freq: Every day | ORAL | 3 refills | Status: DC

## 2018-02-02 NOTE — Assessment & Plan Note (Signed)
Diabetes longstanding currently uncontrolled and treated with multiple medications. Patient is able to verbalize appropriate hypoglycemia management plan. Patient is adherent with medication. Control is suboptimal due to suboptimal medication regimen, poor diet, and low levels of exercise. Increase Jardiance from 10 mg to 25 mg daily. (patient willing to use 2x 10mg  until gone then start 25mg  daily).  Decrease Novolog from 50 units to 45 units twice daily. Decrease Lantus from 50 units to 45 units twice daily.

## 2018-02-02 NOTE — Assessment & Plan Note (Addendum)
Chronic tobacco abuse, quit smoking on 01/25/18 with Chantix 1 mg  qpm and bupropion 150 mg qAM. Breathing has improved. Patient to continue with smoking cessation efforts.

## 2018-02-02 NOTE — Progress Notes (Signed)
   S:    Chief Complaint  Patient presents with  . Medication Management    Diabetes, smoking cessation   Patient arrives in good spirits and energetic. Presents for diabetes evaluation, education, and management at the request of Dr. Darron Doom and as a follow up to last pharmacy visit on 12/25/17. Patient was referred by Dr. Kennon Rounds on 11/16/17.  Patient reports adherence with medications. Current diabetes medications include: Jardiance 10 mg daily, Novolog 50 units twice daily, Lantus 50 units twice daily, metformin 1000 mg twice daily, and Victoza 1.8 mg daily. Current hypertension medications include: lisinopril 2.5 mg daily.  Patient denies hypoglycemic events. Patient reports lows of 88-98 about once weekly. Typically runs in the 100s (reports no readings over 200). Fasting glucose 108 today.  Patient reported dietary habits: Eats 3 meals/day Breakfast: honey nut cheerios with low-fat milk  Lunch: [place] sandwich, peaches, orange Dinner: stuffed shells Snacks: none Drinks: Pepsi Zero  Patient-reported exercise habits: 20 min 3x/week. Patient reports nocturia about once nightly. Patient reports self foot exams with no current issues.   O:  Physical Exam  Constitutional: He appears well-developed and well-nourished.  Vitals reviewed.  Review of Systems  All other systems reviewed and are negative.  Lab Results  Component Value Date   HGBA1C 7.4 11/16/2017   Vitals:   02/02/18 1124  BP: 124/82  Pulse: 71  SpO2: 96%   Lipid Panel     Component Value Date/Time   CHOL 117 04/03/2017 1451   TRIG 187 (H) 04/03/2017 1451   HDL 33 (L) 04/03/2017 1451   CHOLHDL 3.5 04/03/2017 1451   CHOLHDL 3.7 03/29/2016 1227   VLDL 53 (H) 03/29/2016 1227   LDLCALC 47 04/03/2017 1451   Clinical ASCVD: Yes   A/P: Diabetes longstanding currently uncontrolled and treated with multiple medications. Patient is able to verbalize appropriate hypoglycemia management plan. Patient is adherent  with medication. Control is suboptimal due to suboptimal medication regimen, poor diet, and low levels of exercise. Increase Jardiance from 10 mg to 25 mg daily. (patient willing to use 2x 10mg  until gone then start 25mg  daily).  Decrease Novolog from 50 units to 45 units twice daily. Decrease Lantus from 50 units to 45 units twice daily.  ASCVD - secondary prevention in patient with DM. Last LDL is controlled. Continue aspirin 81 mg and atorvastatin 80 mg (high intensity) as indicated.  CAD/angina - currently without chest pain however reports use of NTG approximately once weekly.  Requests additional supply of NTG - provided refill.   Hypertension longstanding currently controlled on lisinopril 2.5 mg daily.  BP goal = 130/80 mmHg. Patient is adherent with medication.   Chronic tobacco abuse, quit smoking on 01/25/18 with Chantix 1 mg  qpm and bupropion 150 mg qAM. Breathing has improved. Patient to continue with smoking cessation efforts.  COPD stable - recently quit smoking.  Adherent with [facility] however he is current awaiting additional supply. Medication sample of one inhaler of Breo Ellipta 100/25 mcg has been provided to the patient. LOT: [id number]; Exp.Date: 12/2018. Patient to take as directed until he receives his [facility] refill.  Consider PFT at next visit.   Written patient instructions provided.  Total time in face to face counseling 30 minutes.   Follow up PCP Clinic Visit next month and pharmacy clinic in two months. Patient seen with Richardine Service, PharmD Candidate and Angus Seller, PharmD, PGY1 Pharmacy Resident.

## 2018-02-02 NOTE — Progress Notes (Signed)
Patient ID: [name] [name], male   DOB: 09/24/1960, 56 y.o.   MRN: [id number] Reviewed: Agree with Dr. Koval's documentation and management. 

## 2018-02-02 NOTE — Assessment & Plan Note (Signed)
Chronic tobacco abuse, quit smoking on 01/25/18 with Chantix 1 mg  qpm and bupropion 150 mg qAM. Breathing has improved. Patient to continue with smoking cessation efforts.  COPD stable - recently quit smoking.  Adherent with [facility] however he is current awaiting additional supply. Medication sample of one inhaler of Breo Ellipta 100/25 mcg has been provided to the patient. LOT: [id number]; Exp.Date: 12/2018. Patient to take as directed until he receives his [facility] refill.  Consider PFT at next visit.

## 2018-02-02 NOTE — Assessment & Plan Note (Signed)
ASCVD - secondary prevention in patient with DM. Last LDL is controlled. Continue aspirin 81 mg and atorvastatin 80 mg (high intensity) as indicated.  CAD/angina - currently without chest pain however reports use of NTG approximately once weekly.  Requests additional supply of NTG - provided refill.

## 2018-02-02 NOTE — Patient Instructions (Addendum)
Thank you for visiting [place] today!  1. START taking 2 tablets of Jardiance daily until finish bottle. Then take 1 tablet of 25 mg Jardiance daily.  2. DECREASE insulin novolog to 45 units twice daily.  3. DECREASE Lantus to 45 units twice daily.  Great job with stopping smoking! Keep up the good work!   Follow up with pharmacy clinic in July after your visit with Dr. Kennon Rounds.

## 2018-02-13 ENCOUNTER — Other Ambulatory Visit: Payer: Self-pay | Admitting: Family Medicine

## 2018-02-13 DIAGNOSIS — Z720 Tobacco use: Secondary | ICD-10-CM

## 2018-02-22 ENCOUNTER — Other Ambulatory Visit: Payer: Self-pay | Admitting: Family Medicine

## 2018-02-22 DIAGNOSIS — Z9861 Coronary angioplasty status: Principal | ICD-10-CM

## 2018-02-22 DIAGNOSIS — I2510 Atherosclerotic heart disease of native coronary artery without angina pectoris: Secondary | ICD-10-CM

## 2018-02-22 DIAGNOSIS — I10 Essential (primary) hypertension: Secondary | ICD-10-CM

## 2018-03-01 ENCOUNTER — Encounter: Payer: Self-pay | Admitting: Family Medicine

## 2018-03-01 ENCOUNTER — Other Ambulatory Visit: Payer: Self-pay

## 2018-03-01 ENCOUNTER — Ambulatory Visit: Payer: Medicaid Other | Admitting: Family Medicine

## 2018-03-01 VITALS — BP 130/72 | HR 92 | Temp 98.8°F | Ht 67.0 in | Wt 283.0 lb

## 2018-03-01 DIAGNOSIS — J449 Chronic obstructive pulmonary disease, unspecified: Secondary | ICD-10-CM | POA: Diagnosis not present

## 2018-03-01 DIAGNOSIS — E785 Hyperlipidemia, unspecified: Secondary | ICD-10-CM | POA: Diagnosis not present

## 2018-03-01 DIAGNOSIS — I10 Essential (primary) hypertension: Secondary | ICD-10-CM

## 2018-03-01 DIAGNOSIS — Z720 Tobacco use: Secondary | ICD-10-CM | POA: Diagnosis not present

## 2018-03-01 DIAGNOSIS — Z794 Long term (current) use of insulin: Secondary | ICD-10-CM | POA: Diagnosis not present

## 2018-03-01 DIAGNOSIS — E1159 Type 2 diabetes mellitus with other circulatory complications: Secondary | ICD-10-CM | POA: Diagnosis present

## 2018-03-01 DIAGNOSIS — W57XXXA Bitten or stung by nonvenomous insect and other nonvenomous arthropods, initial encounter: Secondary | ICD-10-CM | POA: Diagnosis not present

## 2018-03-01 LAB — POCT GLYCOSYLATED HEMOGLOBIN (HGB A1C): HBA1C, POC (CONTROLLED DIABETIC RANGE): 7.6 % — AB (ref 0.0–7.0)

## 2018-03-01 MED ORDER — VARENICLINE TARTRATE 1 MG PO TABS: 1.0000 mg | ORAL_TABLET | Freq: Two times a day (BID) | ORAL | 0 refills | Status: DC

## 2018-03-01 MED ORDER — FLUTICASONE FUROATE-VILANTEROL 100-25 MCG/INH IN AEPB: 1.0000 | INHALATION_SPRAY | Freq: Every day | RESPIRATORY_TRACT | 0 refills | Status: DC

## 2018-03-01 MED ORDER — HYDROXYZINE PAMOATE 25 MG PO CAPS: 25.0000 mg | ORAL_CAPSULE | Freq: Three times a day (TID) | ORAL | 0 refills | Status: DC | PRN

## 2018-03-01 NOTE — Patient Instructions (Addendum)
Smoking Tobacco Information Smoking tobacco will very likely harm your health. Tobacco contains a poisonous (toxic), colorless chemical called nicotine. Nicotine affects the brain and makes tobacco addictive. This change in your brain can make it hard to stop smoking. Tobacco also has other toxic chemicals that can hurt your body and raise your risk of many cancers. How can smoking tobacco affect me? Smoking tobacco can increase your chances of having serious health conditions, such as:  Cancer. Smoking is most commonly associated with lung cancer, but can lead to cancer in other parts of the body.  Chronic obstructive pulmonary disease (COPD). This is a long-term lung condition that makes it hard to breathe. It also gets worse over time.  High blood pressure (hypertension), heart disease, stroke, or heart attack.  Lung infections, such as pneumonia.  Cataracts. This is when the lenses in the eyes become clouded.  Digestive problems. This may include peptic ulcers, heartburn, and gastroesophageal reflux disease (GERD).  Oral health problems, such as gum disease and tooth loss.  Loss of taste and smell.  Smoking can affect your appearance by causing:  Wrinkles.  Yellow or stained teeth, fingers, and fingernails.  Smoking tobacco can also affect your social life.  Many workplaces, restaurants, hotels, and public places are tobacco-free. This means that you may experience challenges in finding places to smoke when away from home.  The cost of a smoking habit can be expensive. Expenses for someone who smokes come in two ways: ? You spend money on a regular basis to buy tobacco. ? Your health care costs in the long-term are higher if you smoke.  Tobacco smoke can also affect the health of those around you. Children of smokers have greater chances of: ? Sudden infant death syndrome (SIDS). ? Ear infections. ? Lung infections.  What lifestyle changes can be made?  Do not start  smoking. Quit if you already do.  To quit smoking: ? Make a plan to quit smoking and commit yourself to it. Look for programs to help you and ask your health care provider for recommendations and ideas. ? Talk with your health care provider about using nicotine replacement medicines to help you quit. Medicine replacement medicines include gum, lozenges, patches, sprays, or pills. ? Do not replace cigarette smoking with electronic cigarettes, which are commonly called e-cigarettes. The safety of e-cigarettes is not known, and some may contain harmful chemicals. ? Avoid places, people, or situations that tempt you to smoke. ? If you try to quit but return to smoking, don't give up hope. It is very common for people to try a number of times before they fully succeed. When you feel ready again, give it another try.  Quitting smoking might affect the way you eat as well as your weight. Be prepared to monitor your eating habits. Get support in planning and following a healthy diet.  Ask your health care provider about having regular tests (screenings) to check for cancer. This may include blood tests, imaging tests, and other tests.  Exercise regularly. Consider taking walks, joining a gym, or doing yoga or exercise classes.  Develop skills to manage your stress. These skills include meditation. What are the benefits of quitting smoking? By quitting smoking, you may:  Lower your risk of getting cancer and other diseases caused by smoking.  Live longer.  Breathe better.  Lower your blood pressure and heart rate.  Stop your addiction to tobacco.  Stop creating secondhand smoke that hurts other people.  Improve your   sense of taste and smell.  Look better over time, due to having fewer wrinkles and less staining.  What can happen if changes are not made? If you do not stop smoking, you may:  Get cancer and other diseases.  Develop COPD or other long-term (chronic) lung  conditions.  Develop serious problems with your heart and blood vessels (cardiovascular system).  Need more tests to screen for problems caused by smoking.  Have higher, long-term healthcare costs from medicines or treatments related to smoking.  Continue to have worsening changes in your lungs, mouth, and nose.  Where to find support: To get support to quit smoking, consider:  Asking your health care provider for more information and resources.  Taking classes to learn more about quitting smoking.  Looking for local organizations that offer resources about quitting smoking.  Joining a support group for people who want to quit smoking in your local community.  Where to find more information: You may find more information about quitting smoking from:  HelpGuide.org: www.helpguide.org/articles/addictions/how-to-quit-smoking.htm  https://hall.com/: smokefree.gov  American Lung Association: www.lung.org  Contact a health care provider if:  You have problems breathing.  Your lips, nose, or fingers turn blue.  You have chest pain.  You are coughing up blood.  You feel faint or you pass out.  You have other noticeable changes that cause you to worry. Summary  Smoking tobacco can negatively affect your health, the health of those around you, your finances, and your social life.  Do not start smoking. Quit if you already do. If you need help quitting, ask your health care provider.  Think about joining a support group for people who want to quit smoking in your local community. There are many effective programs that will help you to quit this behavior. This information is not intended to replace advice given to you by your health care provider. Make sure you discuss any questions you have with your health care provider. Document Released: 09/13/2016 Document Revised: 09/13/2016 Document Reviewed: 09/13/2016 Elsevier Interactive Patient Education  2018 [employer].  Diabetes  Mellitus and Nutrition When you have diabetes (diabetes mellitus), it is very important to have healthy eating habits because your blood sugar (glucose) levels are greatly affected by what you eat and drink. Eating healthy foods in the appropriate amounts, at about the same times every day, can help you:  Control your blood glucose.  Lower your risk of heart disease.  Improve your blood pressure.  Reach or maintain a healthy weight.  Every person with diabetes is different, and each person has different needs for a meal plan. Your health care provider may recommend that you work with a diet and nutrition specialist (dietitian) to make a meal plan that is best for you. Your meal plan may vary depending on factors such as:  The calories you need.  The medicines you take.  Your weight.  Your blood glucose, blood pressure, and cholesterol levels.  Your activity level.  Other health conditions you have, such as heart or kidney disease.  How do carbohydrates affect me? Carbohydrates affect your blood glucose level more than any other type of food. Eating carbohydrates naturally increases the amount of glucose in your blood. Carbohydrate counting is a method for keeping track of how many carbohydrates you eat. Counting carbohydrates is important to keep your blood glucose at a healthy level, especially if you use insulin or take certain oral diabetes medicines. It is important to know how many carbohydrates you can safely have  in each meal. This is different for every person. Your dietitian can help you calculate how many carbohydrates you should have at each meal and for snack. Foods that contain carbohydrates include:  Bread, cereal, rice, pasta, and crackers.  Potatoes and corn.  Peas, beans, and lentils.  Milk and yogurt.  Fruit and juice.  Desserts, such as cakes, cookies, ice cream, and candy.  How does alcohol affect me? Alcohol can cause a sudden decrease in blood glucose  (hypoglycemia), especially if you use insulin or take certain oral diabetes medicines. Hypoglycemia can be a life-threatening condition. Symptoms of hypoglycemia (sleepiness, dizziness, and confusion) are similar to symptoms of having too much alcohol. If your health care provider says that alcohol is safe for you, follow these guidelines:  Limit alcohol intake to no more than 1 drink per day for nonpregnant women and 2 drinks per day for men. One drink equals 12 oz of beer, 5 oz of wine, or 1 oz of hard liquor.  Do not drink on an empty stomach.  Keep yourself hydrated with water, diet soda, or unsweetened iced tea.  Keep in mind that regular soda, juice, and other mixers may contain a lot of sugar and must be counted as carbohydrates.  What are tips for following this plan? Reading food labels  Start by checking the serving size on the label. The amount of calories, carbohydrates, fats, and other nutrients listed on the label are based on one serving of the food. Many foods contain more than one serving per package.  Check the total grams (g) of carbohydrates in one serving. You can calculate the number of servings of carbohydrates in one serving by dividing the total carbohydrates by 15. For example, if a food has 30 g of total carbohydrates, it would be equal to 2 servings of carbohydrates.  Check the number of grams (g) of saturated and trans fats in one serving. Choose foods that have low or no amount of these fats.  Check the number of milligrams (mg) of sodium in one serving. Most people should limit total sodium intake to less than 2,300 mg per day.  Always check the nutrition information of foods labeled as "low-fat" or "nonfat". These foods may be higher in added sugar or refined carbohydrates and should be avoided.  Talk to your dietitian to identify your daily goals for nutrients listed on the label. Shopping  Avoid buying canned, premade, or processed foods. These foods tend  to be high in fat, sodium, and added sugar.  Shop around the outside edge of the grocery store. This includes fresh fruits and vegetables, bulk grains, fresh meats, and fresh dairy. Cooking  Use low-heat cooking methods, such as baking, instead of high-heat cooking methods like deep frying.  Cook using healthy oils, such as olive, canola, or sunflower oil.  Avoid cooking with butter, cream, or high-fat meats. Meal planning  Eat meals and snacks regularly, preferably at the same times every day. Avoid going long periods of time without eating.  Eat foods high in fiber, such as fresh fruits, vegetables, beans, and whole grains. Talk to your dietitian about how many servings of carbohydrates you can eat at each meal.  Eat 4-6 ounces of lean protein each day, such as lean meat, chicken, fish, eggs, or tofu. 1 ounce is equal to 1 ounce of meat, chicken, or fish, 1 egg, or 1/4 cup of tofu.  Eat some foods each day that contain healthy fats, such as avocado, nuts, seeds, and  fish. Lifestyle   Check your blood glucose regularly.  Exercise at least 30 minutes 5 or more days each week, or as told by your health care provider.  Take medicines as told by your health care provider.  Do not use any products that contain nicotine or tobacco, such as cigarettes and e-cigarettes. If you need help quitting, ask your health care provider.  Work with a Social worker or diabetes educator to identify strategies to manage stress and any emotional and social challenges. What are some questions to ask my health care provider?  Do I need to meet with a diabetes educator?  Do I need to meet with a dietitian?  What number can I call if I have questions?  When are the best times to check my blood glucose? Where to find more information:  American Diabetes Association: diabetes.org/food-and-fitness/food  Academy of Nutrition and Dietetics:  PokerClues.dk  [employer] of Diabetes and Digestive and Kidney Diseases (NIH): ContactWire.be Summary  A healthy meal plan will help you control your blood glucose and maintain a healthy lifestyle.  Working with a diet and nutrition specialist (dietitian) can help you make a meal plan that is best for you.  Keep in mind that carbohydrates and alcohol have immediate effects on your blood glucose levels. It is important to count carbohydrates and to use alcohol carefully. This information is not intended to replace advice given to you by your health care provider. Make sure you discuss any questions you have with your health care provider. Document Released: 05/26/2005 Document Revised: 10/03/2016 Document Reviewed: 10/03/2016 Elsevier Interactive Patient Education  [employer].

## 2018-03-01 NOTE — Progress Notes (Signed)
   Subjective:    Patient ID: Morry Veiga is a 57 y.o. male presenting with Diabetes  on 03/01/2018  HPI: Stopped smoking. Patient has a disability hearing on 7/2. Has 3 dogs and 2 cats and has fleas. Has some itching that he thinks are from flea bites.  Review of Systems  Constitutional: Negative for chills and fever.  Respiratory: Negative for shortness of breath.   Cardiovascular: Negative for leg swelling.  Gastrointestinal: Negative for abdominal pain, nausea and vomiting.      Objective:    Pulse 92   Temp 98.8 F (37.1 C) (Oral)   Ht 5\' 7"  (1.702 m)   Wt 283 lb (128.4 kg)   SpO2 93%   BMI 44.32 kg/m  Physical Exam  Constitutional: He appears well-developed and well-nourished. No distress.  HENT:  Head: Normocephalic and atraumatic.  Eyes: No scleral icterus.  Neck: Neck supple.  Cardiovascular: Normal rate.  Pulmonary/Chest: Effort normal.  Abdominal: Soft.  Musculoskeletal: He exhibits no edema.  Neurological: He is alert.  Skin: Skin is warm.  Psychiatric: He has a normal mood and affect.  Vitals reviewed.      HgbA1C 7.6 Assessment & Plan:   Problem List Items Addressed This Visit      Medium   Essential hypertension (Chronic)    Well controlled on Lisinopril, carvedilol      Hyperlipidemia with target LDL less than 70 (Chronic)    Continue Lipitor--well cotnrolled      DM type 2 (diabetes mellitus, type 2) ([place]) - Primary    Control is ok--work on fast food in the summer. Continue Victoza, Metformin, Jardiance      Relevant Orders   HgB A1c (Completed)   Tobacco abuse    Has now been quit for > 30 days, continue Wellbutrin and Chantix. Likely just needs 1-2 more months of Chantix      Relevant Medications   varenicline (CHANTIX CONTINUING MONTH PAK) 1 MG tablet     Unprioritized   COPD, moderate (HCC) (Chronic)    Refilled Breo samples      Relevant Medications   varenicline (CHANTIX CONTINUING MONTH PAK) 1 MG tablet   fluticasone furoate-vilanterol (BREO ELLIPTA) 100-25 MCG/INH AEPB    Other Visit Diagnoses    Flea bite, initial encounter       New-discussed symptom management and prevention   Relevant Medications   hydrOXYzine (VISTARIL) 25 MG capsule      Total face-to-face time with patient: 25 minutes. Over 50% of encounter was spent on counseling and coordination of care. Return in about 3 months (around 06/01/2018).  [name] 03/01/2018 2:27 PM

## 2018-03-03 ENCOUNTER — Encounter: Payer: Self-pay | Admitting: Family Medicine

## 2018-03-03 MED ORDER — FLUTICASONE FUROATE-VILANTEROL 100-25 MCG/INH IN AEPB: 1.0000 | INHALATION_SPRAY | Freq: Every day | RESPIRATORY_TRACT | 0 refills | Status: DC

## 2018-03-03 NOTE — Assessment & Plan Note (Signed)
Continue Lipitor--well cotnrolled

## 2018-03-03 NOTE — Assessment & Plan Note (Addendum)
Control is ok--work on fast food in the summer. Continue Victoza, Metformin, Jardiance

## 2018-03-03 NOTE — Assessment & Plan Note (Signed)
Has now been quit for > 30 days, continue Wellbutrin and Chantix. Likely just needs 1-2 more months of Chantix

## 2018-03-03 NOTE — Assessment & Plan Note (Signed)
Refilled Breo samples

## 2018-03-03 NOTE — Assessment & Plan Note (Signed)
Well controlled on Lisinopril, carvedilol

## 2018-03-12 ENCOUNTER — Other Ambulatory Visit: Payer: Self-pay | Admitting: Family Medicine

## 2018-03-12 DIAGNOSIS — Z720 Tobacco use: Secondary | ICD-10-CM

## 2018-03-19 ENCOUNTER — Encounter: Payer: Self-pay | Admitting: Podiatry

## 2018-03-19 ENCOUNTER — Ambulatory Visit: Payer: Medicaid Other

## 2018-03-19 DIAGNOSIS — E119 Type 2 diabetes mellitus without complications: Secondary | ICD-10-CM

## 2018-03-19 DIAGNOSIS — M79674 Pain in right toe(s): Secondary | ICD-10-CM | POA: Diagnosis not present

## 2018-03-19 DIAGNOSIS — B351 Tinea unguium: Secondary | ICD-10-CM

## 2018-03-19 DIAGNOSIS — M79675 Pain in left toe(s): Secondary | ICD-10-CM

## 2018-03-21 ENCOUNTER — Other Ambulatory Visit: Payer: Self-pay | Admitting: Family Medicine

## 2018-03-21 DIAGNOSIS — Z9861 Coronary angioplasty status: Principal | ICD-10-CM

## 2018-03-21 DIAGNOSIS — I10 Essential (primary) hypertension: Secondary | ICD-10-CM

## 2018-03-21 DIAGNOSIS — I2510 Atherosclerotic heart disease of native coronary artery without angina pectoris: Secondary | ICD-10-CM

## 2018-03-23 LAB — HM DIABETES EYE EXAM

## 2018-03-23 NOTE — Progress Notes (Signed)
Subjective:   Patient ID: [name] [name], male   DOB: 57 y.o.   MRN: [id number]   HPI Long-term diabetic with extreme obesity with thick yellow brittle nailbeds 1-5 both feet that he cannot cut   ROS      Objective:  Physical Exam  Neurovascular status unchanged with thick yellow brittle nailbeds 1-5 both feet that are painful     Assessment:  Mycotic nail infections with pain 1-5 both feet. Pedal pulses present bilateral. No open lesions, wounds or callused areas     Plan:  Debride painful nailbeds 1-5 both feet with no iatrogenic bleeding noted

## 2018-03-29 ENCOUNTER — Encounter: Payer: Self-pay | Admitting: Pharmacist

## 2018-03-29 ENCOUNTER — Ambulatory Visit: Payer: Medicaid Other | Admitting: Pharmacist

## 2018-03-29 DIAGNOSIS — E1159 Type 2 diabetes mellitus with other circulatory complications: Secondary | ICD-10-CM

## 2018-03-29 DIAGNOSIS — Z720 Tobacco use: Secondary | ICD-10-CM

## 2018-03-29 DIAGNOSIS — J449 Chronic obstructive pulmonary disease, unspecified: Secondary | ICD-10-CM

## 2018-03-29 DIAGNOSIS — E1110 Type 2 diabetes mellitus with ketoacidosis without coma: Secondary | ICD-10-CM | POA: Diagnosis not present

## 2018-03-29 DIAGNOSIS — Z794 Long term (current) use of insulin: Secondary | ICD-10-CM

## 2018-03-29 MED ORDER — LIRAGLUTIDE 18 MG/3ML ~~LOC~~ SOPN: PEN_INJECTOR | SUBCUTANEOUS | 10 refills | Status: DC

## 2018-03-29 MED ORDER — MOMETASONE FURO-FORMOTEROL FUM 200-5 MCG/ACT IN AERO: 2.0000 | INHALATION_SPRAY | Freq: Two times a day (BID) | RESPIRATORY_TRACT | 11 refills | Status: DC

## 2018-03-29 NOTE — Progress Notes (Signed)
   S:    Patient arrives in good spirits. Presents for lung function evaluation and diabetes evaluation, education, and management at the request of Dr. Darron Doom and as a follow up to last pharmacy visit on 12/25/17. Patient was referred by Dr. Kennon Rounds on 03/01/2018.  Patient reports breathing has been overall well controlled, sometimes more difficult due to the heat.  Patient reports adherence to medications Current COPD medications: Breo 100 mcg/25 mcg 1 puff daily (alternates with Dulera 200 mcg/5 mcg based on clinic sample availability)  Rescue inhaler use frequency: About every other day Notes that he started smoking at age 83; was smoking 10-15 cigarettes/day but quit 64 days ago (01/2018)  Current diabetes medications: metformin 1000 mg BID, Jardiance (empagliflozin) 25 mg daily, Lantus (insulin glargine) 45 units BID, Novolog (insulin aspart) 45 units BID, and Victoza (liraglutide) 1.8 mg daily Patient reports adherence to medications. Notes that he will occasionally give a third dose of Lantus if SMBG results are high   O: Physical Exam  Constitutional: He appears well-developed and well-nourished.  Vitals reviewed.   Review of Systems  All other systems reviewed and are negative.   Vitals:   03/29/18 1409  BP: 118/64  Pulse: 76  SpO2: 94%   See "scanned report" or Documentation Flowsheet (discrete results - PFTs) for Spirometry results. Patient provided good effort while attempting spirometry.   Lung Age = 87  Home SMBG Results:  - Patient reports readings in low 100s with several readings <100, lowest 79  A/P: Patient has been experiencing dyspnea taking alternating Dulera and Breo, but improved since he quit smoking. Spirometry evaluation reveals mild restriction. - Continue Dulera/Breo and PRN albuterol  - Refilled Dulera  Tobacco Cessation Chronic tobacco use greater than 40 years, quit for 64 days. Continues on bupropion XL 150 mg daily at this time. Reevaluate  at next PCP visit.   Diabetes longstanding currently uncontrolled and treated with multiple medications. Patient is able to verbalize appropriate hypoglycemia management plan. Patient is adherent with medication. Control is suboptimal due to suboptimal medication regimen, poor diet, and low levels of exercise - Continue metformin, Jardiance, Lantus, Novolog, and Victoza at current doses - Can take an additional dose of Novolog (third dose) if patient eats a third meal of the day, rather than taking a third dose of Lantus - Refilled Victoza  Written pt instructions provided.  F/U Clinic visit with PCP Dr Kennon Rounds 04/18/2018. Next visit Rx clinic per Dr. Kennon Rounds. Total time in face to face counseling 30 minutes. Patient seen with Catie Darnelle Maffucci, PharmD,  PGY2 Pharmacy Resident.

## 2018-03-29 NOTE — Assessment & Plan Note (Signed)
Diabetes longstanding currently uncontrolled and treated with multiple medications. Patient is able to verbalize appropriate hypoglycemia management plan. Patient is adherent with medication. Control is suboptimal due to suboptimal medication regimen, poor diet, and low levels of exercise - Continue metformin, Jardiance, Lantus, Novolog, and Victoza at current doses - Can take an additional dose of Novolog (third dose) if patient eats a third meal of the day, rather than taking a third dose of Lantus

## 2018-03-29 NOTE — Patient Instructions (Addendum)
It was great to see you today! Congratulations on doing an [place] job quitting smoking! Your lung function tests were good today. Working on increasing your exercise will likely help the most with this!   For your diabetes - keep taking Lantus and Novolog twice daily. If you eat a third meal, you can take a third dose of Novolog.   If you consistently see sugar readings less than 80, give the clinic a call.   Next visit with Dr. Kennon Rounds.

## 2018-03-29 NOTE — Assessment & Plan Note (Signed)
Patient has been experiencing dyspnea taking alternating Dulera and Breo, but improved since he quit smoking. Spirometry evaluation reveals mild restriction. - Continue Dulera/Breo and PRN albuterol

## 2018-03-29 NOTE — Assessment & Plan Note (Signed)
Tobacco Cessation Chronic tobacco use greater than 40 years, quit for 64 days. Continues on bupropion XL 150 mg daily at this time. Reevaluate at next PCP visit.

## 2018-03-29 NOTE — Progress Notes (Signed)
Patient ID: [name] [name], male   DOB: 14-Oct-1960, 57 y.o.   MRN: [id number] Reviewed: Agree with Dr. Graylin Shiver documentation and management.

## 2018-04-08 ENCOUNTER — Other Ambulatory Visit: Payer: Self-pay | Admitting: Family Medicine

## 2018-04-08 DIAGNOSIS — Z720 Tobacco use: Secondary | ICD-10-CM

## 2018-04-10 ENCOUNTER — Encounter: Payer: Self-pay | Admitting: Physician Assistant

## 2018-04-10 ENCOUNTER — Ambulatory Visit: Payer: Medicaid Other | Admitting: Physician Assistant

## 2018-04-10 VITALS — BP 116/66 | HR 76 | Ht 67.0 in | Wt 287.0 lb

## 2018-04-10 DIAGNOSIS — Z9989 Dependence on other enabling machines and devices: Secondary | ICD-10-CM

## 2018-04-10 DIAGNOSIS — G4733 Obstructive sleep apnea (adult) (pediatric): Secondary | ICD-10-CM | POA: Diagnosis not present

## 2018-04-10 DIAGNOSIS — E119 Type 2 diabetes mellitus without complications: Secondary | ICD-10-CM

## 2018-04-10 DIAGNOSIS — Z9861 Coronary angioplasty status: Secondary | ICD-10-CM

## 2018-04-10 DIAGNOSIS — I2510 Atherosclerotic heart disease of native coronary artery without angina pectoris: Secondary | ICD-10-CM

## 2018-04-10 DIAGNOSIS — Z794 Long term (current) use of insulin: Secondary | ICD-10-CM

## 2018-04-10 DIAGNOSIS — E785 Hyperlipidemia, unspecified: Secondary | ICD-10-CM

## 2018-04-10 DIAGNOSIS — I10 Essential (primary) hypertension: Secondary | ICD-10-CM | POA: Diagnosis not present

## 2018-04-10 MED ORDER — PRASUGREL HCL 10 MG PO TABS
10.0000 mg | ORAL_TABLET | Freq: Every day | ORAL | 3 refills | Status: DC
Start: 2018-04-10 — End: 2019-06-25

## 2018-04-10 NOTE — Progress Notes (Signed)
Cardiology Office Note    Date:  04/10/2018   ID:  [name] [name], DOB [birth year], MRN [id number]  PCP:  [name], MD  Cardiologist:  [name]   Chief Complaint  Patient presents with  . Follow-up    seen for [name].     History of Present Illness:  [name] [name] is a 57 y.o. male with PMH of CAD, COPD, GERD, hypertension, hyperlipidemia, DM 2 and obstructive sleep apnea on CPAP.  He had RCA stent placed in May 2013.  He was admitted for NSTEMI in February 2015 and found to have severe left circumflex and OM disease treated with bare-metal stent.  Myoview in October 2015 was intermediate risk and he had repeat cardiac catheterization in November 2015 and found to have severe single-vessel disease involving the bifurcation of OM1 and OM 2 with 99% in-stent restenosis of the bare-metal stent in the OM 2.  He underwent successful PCI spanning the proximal left circumflex across OM1 through the bare-metal stent placed the arm to with drug-eluting stent.  He also noted to have widely patent stent in the RCA in the proximal left circumflex.  EF was preserved at 55 to 65%.  His last office visit with [name] was in January 2019, he was doing well at the time.  Patient presents today for cardiology office visit.  He just had a fasting lipid panel today, the result is currently pending.  Otherwise he denies any chest pain, shortness of breath, lower extremity edema, orthopnea or PND.  He says he has been having some occasional chest discomfort once every few days, but this only occurs on extremely hot days.  Normally he does not have any chest pain with exertion.  I recommended him to continue observation and let us know if this symptom increasing frequency.   Past Medical History:  Diagnosis Date  . Alcoholism ([place])   . Anxiety   . Arthritis    "qwhere"  . Asthma   . CAD S/P percutaneous coronary angioplasty 01/18/12; 10/2013   a. pRCA 3.5 x 18 vision BMS - 4.2 mm; b. 2/'15: mCx 3.5 x  12  Rebel BMS (3.6-3.7 mm)  . Chronic back pain    "mid/lower" (08/01/2014)  . COPD (chronic obstructive pulmonary disease) ([place])    "I'm seeing COPD dr now; don't know if I've got it" (08/01/2014)  . Depression   . GERD (gastroesophageal reflux disease)   . Hyperlipidemia   . Hypertension   . Migraines    "once in awhile" (08/01/2014)  . Non-ST elevation myocardial infarction (NSTEMI) ([place]) 10/2013  . OSA on CPAP 01/19/2012  . Type II diabetes mellitus ([place])     Past Surgical History:  Procedure Laterality Date  . CARDIAC CATHETERIZATION  07/2014   Left Main: Short, large-caliber vessel. Widely patent. Bifurcates into the LAD and Circumflex. Angiographically normal.  . CORONARY ANGIOPLASTY WITH STENT PLACEMENT  01/2012; 11/07/2013; 08/01/2014   "1 + 2 + 1"   . CORONARY ANGIOPLASTY WITH STENT PLACEMENT  07/2014   Severe single-vessel disease involving the bifurcation of OM1 and OM 2 with 99% in-stent restenosis in the bare-metal stent placed in the OM 2.  . LEFT HEART CATHETERIZATION WITH CORONARY ANGIOGRAM N/A 01/20/2012   Procedure: LEFT HEART CATHETERIZATION WITH CORONARY ANGIOGRAM;  Surgeon: Jettie Booze, MD;  Location: [facility] CATH LAB;  Service: Cardiovascular;  Laterality: N/A;  . LEFT HEART CATHETERIZATION WITH CORONARY ANGIOGRAM N/A 11/07/2013   Procedure: LEFT HEART CATHETERIZATION WITH CORONARY  ANGIOGRAM;  Surgeon: Jettie Booze, MD;  Location: [facility] CATH LAB;  Service: Cardiovascular;  Laterality: N/A;  . LEFT HEART CATHETERIZATION WITH CORONARY ANGIOGRAM N/A 08/01/2014   Procedure: LEFT HEART CATHETERIZATION WITH CORONARY ANGIOGRAM;  Surgeon: Leonie Man, MD;  Location: [facility] CATH LAB;  Service: Cardiovascular;  Laterality: N/A;  . PERCUTANEOUS CORONARY STENT INTERVENTION (PCI-S)  01/20/2012   Procedure: PERCUTANEOUS CORONARY STENT INTERVENTION (PCI-S);  Surgeon: Jettie Booze, MD;  Location: [facility] CATH LAB;  Service: Cardiovascular;;  . PERCUTANEOUS CORONARY STENT  INTERVENTION (PCI-S)  11/07/2013   Procedure: PERCUTANEOUS CORONARY STENT INTERVENTION (PCI-S);  Surgeon: Jettie Booze, MD;  Location: [facility] CATH LAB;  Service: Cardiovascular;;    Current Medications: Outpatient Medications Prior to Visit  Medication Sig Dispense Refill  . ACCU-CHEK FASTCLIX LANCETS MISC 1 Units by Percutaneous route 4 (four) times daily. 100 each 12  . albuterol (PROVENTIL HFA;VENTOLIN HFA) 108 (90 Base) MCG/ACT inhaler Inhale 2 puffs every 6 (six) hours as needed into the lungs for wheezing or shortness of breath. 1 Inhaler 1  . aspirin 81 MG chewable tablet Chew 1 tablet (81 mg total) by mouth daily. 180 tablet 2  . atorvastatin (LIPITOR) 80 MG tablet TAKE ONE TABLET BY MOUTH DAILY 90 tablet 3  . B-D INS SYR ULTRAFINE 1CC/31G 31G X 5/16" 1 ML MISC INJECT 5 TIMES DAILY AS DIRECTED. 100 each 2  . BD ULTRA-FINE PEN NEEDLES 29G X 12.7MM MISC USE TO WITH VICTOZA PEN, INJECTION ONCE DAILY. 100 each 10  . Blood Glucose Monitoring Suppl (ACCU-CHEK AVIVA PLUS) w/Device KIT CHECK BLOOD GLUCOSE FASTING AND 2 HOURS AFTER BREAKFAST, LUNCH AND DINNER 1 kit 0  . buPROPion (WELLBUTRIN XL) 150 MG 24 hr tablet TAKE ONE TABLET BY MOUTH DAILY 30 tablet 0  . carvedilol (COREG) 3.125 MG tablet TAKE 1 TABLET BY MOUTH TWO TIMES A DAY WITH A MEAL 60 tablet 0  . citalopram (CELEXA) 20 MG tablet Take 1 tablet (20 mg total) by mouth daily. (Patient taking differently: Take 20 mg by mouth every evening. ) 30 tablet 12  . empagliflozin (JARDIANCE) 25 MG TABS tablet Take 25 mg by mouth daily. 30 tablet 3  . fluticasone furoate-vilanterol (BREO ELLIPTA) 100-25 MCG/INH AEPB Inhale 1 puff into the lungs daily. 1 each 0  . glucose blood (ACCU-CHEK AVIVA PLUS) test strip USE ONE STRIP TO TEST FOUR TIMES A DAY 300 each 11  . hydrOXYzine (VISTARIL) 25 MG capsule Take 1 capsule (25 mg total) by mouth 3 (three) times daily as needed. 30 capsule 0  . insulin aspart (NOVOLOG) 100 UNIT/ML injection Inject 45 Units  into the skin 2 (two) times daily before a meal. 10 mL 3  . insulin glargine (LANTUS) 100 UNIT/ML injection Inject 0.45 mLs (45 Units total) into the skin 2 (two) times daily. 4 vial 5  . liraglutide (VICTOZA) 18 MG/3ML SOPN INJECT 0.3 MLS (1.8 MG TOTAL) INTO THE SKIN DAILY 9 pen 10  . lisinopril (PRINIVIL,ZESTRIL) 2.5 MG tablet Take 1 tablet (2.5 mg total) by mouth 2 (two) times daily. (Patient taking differently: Take 2.5 mg by mouth daily. ) 180 tablet 3  . metFORMIN (GLUCOPHAGE) 1000 MG tablet Take 1 tablet (1,000 mg total) by mouth 2 (two) times daily with a meal. 60 tablet 11  . mometasone-formoterol (DULERA) 200-5 MCG/ACT AERO Inhale 2 puffs into the lungs 2 (two) times daily. 1 Inhaler 11  . Multiple Vitamin (MULTIVITAMIN) tablet Take 1 tablet by mouth daily.    Marland Kitchen  nitroGLYCERIN (NITROSTAT) 0.4 MG SL tablet PLACE 1 TABLET UNDER THE TONGUE EVERY 5 MINUTES FOR 3 DOSES AS NEEDED FOR CHEST PAIN *IF NO RELIEF, CALL 911* 100 tablet 2  . Omega-3 Fatty Acids (FISH OIL PO) Take 1,000 mg by mouth 2 (two) times daily.     . traMADol (ULTRAM) 50 MG tablet Take 1 tablet (50 mg total) by mouth every 6 (six) hours as needed. 20 tablet 1  . varenicline (CHANTIX CONTINUING MONTH PAK) 1 MG tablet Take 1 tablet (1 mg total) by mouth 2 (two) times daily. 180 tablet 0  . prasugrel (EFFIENT) 10 MG TABS tablet Take 1 tablet (10 mg total) by mouth daily. 90 tablet 3   No facility-administered medications prior to visit.      Allergies:   Shellfish allergy   Social History   Socioeconomic History  . Marital status: Single    Spouse name: Not on file  . Number of children: 0  . Years of education: 13  . Highest education level: Not on file  Occupational History  . Occupation: Unemployed    Comment: Used to work for [employer]  . Occupation: Ship broker, Hotel manager [place]  . Financial resource strain: Not on file  . Food insecurity:    Worry: Not on file    Inability: Not on file    . Transportation needs:    Medical: Not on file    Non-medical: Not on file  Tobacco Use  . Smoking status: Former Smoker    Packs/day: 1.00    Years: 42.00    Pack years: 42.00    Types: Cigarettes    Start date: 09/13/1975    Last attempt to quit: 01/25/2018    Years since quitting: 0.2  . Smokeless tobacco: Never Used  . Tobacco comment: Bupropion helping  Substance and Sexual Activity  . Alcohol use: Yes    Alcohol/week: 3.6 oz    Types: 6 Cans of beer per week    Comment: pt reports "I quit 66 days ago" (05/09/16)  . Drug use: No    Comment: Crack Cocaine (denies 05/09/16)  . Sexual activity: Not Currently  Lifestyle  . Physical activity:    Days per week: Not on file    Minutes per session: Not on file  . Stress: Not on file  Relationships  . Social connections:    Talks on phone: Not on file    Gets together: Not on file    Attends religious service: Not on file    Active member of club or organization: Not on file    Attends meetings of clubs or organizations: Not on file    Relationship status: Not on file  Other Topics Concern  . Not on file  Social History Narrative   Single.  Lives with a roommate.  Ambulates independently.     Family History:  The patient's family history includes Breast cancer in his mother; Diabetes in his brother and sister; Heart attack in his brother; Heart attack (age of onset: 21) in his father; Hyperlipidemia in his sister and sister; Hypertension in his sister and sister; Stroke in his brother.   ROS:   Please see the history of present illness.    ROS All other systems reviewed and are negative.   PHYSICAL EXAM:   VS:  BP 116/66   Pulse 76   Ht '5\' 7"'  (1.702 m)   Wt 287 lb (130.2 kg)   BMI 44.95 kg/m  GEN: Well nourished, well developed, in no acute distress  HEENT: normal  Neck: no JVD, carotid bruits, or masses Cardiac: RRR; no murmurs, rubs, or gallops,no edema  Respiratory:  clear to auscultation bilaterally, normal  work of breathing GI: soft, nontender, nondistended, + BS MS: no deformity or atrophy  Skin: warm and dry, no rash Neuro:  Alert and Oriented x 3, Strength and sensation are intact Psych: euthymic mood, full affect  Wt Readings from Last 3 Encounters:  04/10/18 287 lb (130.2 kg)  03/29/18 288 lb (130.6 kg)  03/01/18 283 lb (128.4 kg)      Studies/Labs Reviewed:   EKG:  EKG is not ordered today.   Recent Labs: No results found for requested labs within last 8760 hours.   Lipid Panel    Component Value Date/Time   CHOL 117 04/03/2017 1451   TRIG 187 (H) 04/03/2017 1451   HDL 33 (L) 04/03/2017 1451   CHOLHDL 3.5 04/03/2017 1451   CHOLHDL 3.7 03/29/2016 1227   VLDL 53 (H) 03/29/2016 1227   LDLCALC 47 04/03/2017 1451    Additional studies/ records that were reviewed today include:   Cath 08/01/2014  Left Ventriculography:  EF: 55-65 %  Wall Motion: Normal  Coronary Anatomy:  Dominance: Right  Left Main: Short, large-caliber vessel. Widely patent. Bifurcates into the LAD and Circumflex. Angiographically normal. LAD: Large-caliber vessel that wraps the apex. Diffuse mild luminal irregularities but no significant obstruction. One major proximal diagonal branch and several smaller diagonal branches. No significant disease.  Left Circumflex: Large-caliber, nondominant vessel that gives off a small caliber AV groove branch. Widely patent stent in this segment. Just distal to this the vessel bifurcates into OM1 and OM 2. O2 is relatively free of disease but OM1 the bifurcation to the stent has roughly 60% stenosis and there is 99% in-stent restenosis of the bare-metal stent in OM 2. Distally OM 2 has relatively no significant disease beyond the stent.   RCA: Large-caliber, dominant vessel with a widely patent proximal stent that has maybe 5-10% in-stent restenosis. The vessel then has a roughly 30-40% focal stenosis just after the crux before bifurcates distally into the  Right Posterior Descending Artery (RPDA) and the Right Posterior AV Groove Branch (RPAV). Otherwise mild luminal irregularities.  RPDA: Moderate caliber vessel which does not reach all the way to the apex. Mild diffuse luminal irregularities   RPL Sysytem:The RPAV begins as a moderate caliber vessel that gives off one major and one minor posterior lateral branch.  After reviewing the initial angiography, the culprit lesion was thought to be the severe in-stent restenosis in the OM 2 bare-metal stent with progression of disease in the intervening segment between the circumflex and OM 2 stent at the OM1 bifurcation.  Preparation were made to proceed with PCI on this lesion.  Percutaneous Coronary Intervention:   Guide: 6 Fr   CLS 3.0 Guidewire: BMW for OM 2 and [place] for OM1 Predilation Balloon: Euphora 2.5 mm x 15 mm;  ? 10 Atm x 20 Sec, 12 Atm x 25 Sec - despite multiple attempts inflating the proximal edge of the OM 2 stent, the balloon continued to watermelon seed either proximally or distally. Therefore the plan was made to use a cutting balloon. Predilation Balloon: FlexTome Cutting Balloon 2.5 mm x 10 mm;  ? 12 Atm x 40 Sec - at the bifurcation , 8 Atm x 30 Sec for in-stent restenosis Stent: Xience Alpine DES 3.25 mm x 28 mm;  stents band  from the distal portion of the circumflex stent through the bare-metal stents in OM2 just distally. ? 16 Atm x 30 Sec -- distal diameter 3.45 mm Post-dilation Balloon: [place] Euphora 3.5 mm x 15 mm;  ? 18 Atm x 30 Sec - 3 inflations from distal to proximal ? Final Diameter: ~3.7 mm     ASSESSMENT:    1. CAD S/P percutaneous coronary angioplasty   2. Bilateral low back pain without sciatica   3. Essential hypertension   4. Hyperlipidemia, unspecified hyperlipidemia type   5. Controlled type 2 diabetes mellitus without complication, with long-term current use of insulin ([place])   6. OSA on CPAP      PLAN:  In order of problems listed  above:  1. CAD: Continue aspirin and Lipitor.  Patient does have occasional chest discomfort, however he says it only occurs on really hot days, otherwise he does not have any exertional chest pain normally.  We will continue to observe the symptom and inform us if symptoms become more frequent or last longer.  2. Hypertension: Well-controlled on carvedilol and lisinopril.  3. Hyperlipidemia: Continue Lipitor, lipid panel obtained today. ?fasting?  4. DM2: On insulin, managed by primary care provider   Medication Adjustments/Labs and Tests Ordered: Current medicines are reviewed at length with the patient today.  Concerns regarding medicines are outlined above.  Medication changes, Labs and Tests ordered today are listed in the Patient Instructions below. Patient Instructions  Medication Instructions: Your physician recommends that you continue on your current medications as directed. Please refer to the Current Medication list given to you today.   Follow-Up: Your physician wants you to follow-up in: 6 months with [name]. You will receive a reminder letter in the mail two months in advance. If you don't receive a letter, please call our office to schedule the follow-up appointment.  If you need a refill on your cardiac medications before your next appointment, please call your pharmacy.     Hilbert Corrigan, [place]  04/10/2018 4:38 PM    [facility] [address], [place], [place]  [address] Phone: [phone]; Fax: [phone]

## 2018-04-10 NOTE — Patient Instructions (Signed)
Medication Instructions: Your physician recommends that you continue on your current medications as directed. Please refer to the Current Medication list given to you today.   Follow-Up: Your physician wants you to follow-up in: 6 months with [name]. You will receive a reminder letter in the mail two months in advance. If you don't receive a letter, please call our office to schedule the follow-up appointment.  If you need a refill on your cardiac medications before your next appointment, please call your pharmacy.

## 2018-04-11 ENCOUNTER — Encounter: Payer: Self-pay | Admitting: *Deleted

## 2018-04-11 LAB — BASIC METABOLIC PANEL
BUN / CREAT RATIO: 18 (ref 9–20)
BUN: 15 mg/dL (ref 6–24)
CHLORIDE: 101 mmol/L (ref 96–106)
CO2: 25 mmol/L (ref 20–29)
Calcium: 10.1 mg/dL (ref 8.7–10.2)
Creatinine, Ser: 0.85 mg/dL (ref 0.76–1.27)
GFR, EST AFRICAN AMERICAN: 113 mL/min/{1.73_m2} (ref >59)
GFR, EST NON AFRICAN AMERICAN: 97 mL/min/{1.73_m2} (ref >59)
Glucose: 175 mg/dL — ABNORMAL HIGH (ref 65–99)
POTASSIUM: 4.7 mmol/L (ref 3.5–5.2)
SODIUM: 142 mmol/L (ref 134–144)

## 2018-04-11 LAB — LIPID PANEL W/O CHOL/HDL RATIO
Cholesterol, Total: 118 mg/dL (ref 100–199)
HDL: 35 mg/dL — AB (ref >39)
LDL Calculated: 53 mg/dL (ref 0–99)
Triglycerides: 150 mg/dL — ABNORMAL HIGH (ref 0–149)
VLDL Cholesterol Cal: 30 mg/dL (ref 5–40)

## 2018-04-11 LAB — HEPATIC FUNCTION PANEL
ALBUMIN: 4.3 g/dL (ref 3.5–5.5)
ALK PHOS: 152 IU/L — AB (ref 39–117)
ALT: 27 IU/L (ref 0–44)
AST: 18 IU/L (ref 0–40)
BILIRUBIN TOTAL: 0.4 mg/dL (ref 0.0–1.2)
Bilirubin, Direct: 0.13 mg/dL (ref 0.00–0.40)
Total Protein: 7.4 g/dL (ref 6.0–8.5)

## 2018-04-18 ENCOUNTER — Encounter: Payer: Self-pay | Admitting: Family Medicine

## 2018-04-18 ENCOUNTER — Ambulatory Visit: Payer: Medicaid Other | Admitting: Family Medicine

## 2018-04-18 ENCOUNTER — Other Ambulatory Visit: Payer: Self-pay

## 2018-04-18 VITALS — BP 124/66 | HR 86 | Temp 98.0°F | Ht 67.0 in | Wt 290.0 lb

## 2018-04-18 DIAGNOSIS — Z720 Tobacco use: Secondary | ICD-10-CM | POA: Diagnosis not present

## 2018-04-18 DIAGNOSIS — Z0271 Encounter for disability determination: Secondary | ICD-10-CM | POA: Diagnosis not present

## 2018-04-18 NOTE — Assessment & Plan Note (Signed)
Continue Wellbutrin, using gum and lozenges for cessation. Congratulated and encouraged to keep up the good work.

## 2018-04-18 NOTE — Progress Notes (Signed)
   Subjective:    Patient ID: [name] [name] is a 57 y.o. male presenting with complete forms  on 04/18/2018  HPI: Got his disability to go through. Now trying to get his student loans forgiven. Has paperwork to be filled out.  Still not smoking on to 84 days of cessation. Continues his Wellbutrin.  Review of Systems  Constitutional: Negative for chills and fever.  Respiratory: Negative for shortness of breath.   Cardiovascular: Negative for leg swelling.  Gastrointestinal: Negative for abdominal pain, nausea and vomiting.      Objective:    BP 124/66   Pulse 86   Temp 98 F (36.7 C) (Oral)   Ht 5\' 7"  (1.702 m)   Wt 290 lb (131.5 kg)   SpO2 95%   BMI 45.42 kg/m  Physical Exam  Constitutional: He appears well-developed and well-nourished. No distress.  HENT:  Head: Normocephalic and atraumatic.  Eyes: No scleral icterus.  Neck: Neck supple.  Cardiovascular: Normal rate.  Pulmonary/Chest: Effort normal.  Abdominal: Soft.  Musculoskeletal: He exhibits no edema.  Neurological: He is alert.  Skin: Skin is warm.  Psychiatric: He has a normal mood and affect.  Vitals reviewed.       Assessment & Plan:   Problem List Items Addressed This Visit      Medium   Tobacco abuse    Continue Wellbutrin, using gum and lozenges for cessation. Congratulated and encouraged to keep up the good work.       Other Visit Diagnoses    Disability examination    -  Primary   Filled out paper work only      Total face-to-face time with patient: 10 minutes. Over 50% of encounter was spent on counseling and coordination of care. Return in about 1 month (around 05/16/2018).  [name] 04/18/2018 5:03 PM

## 2018-04-18 NOTE — Patient Instructions (Signed)
Bupropion sustained-release tablets (smoking cessation)  What is this medicine?  BUPROPION (byoo PROE pee on) is used to help people quit smoking.  This medicine may be used for other purposes; ask your health care provider or pharmacist if you have questions.  COMMON BRAND NAME(S): Buproban, Zyban  What should I tell my health care provider before I take this medicine?  They need to know if you have any of these conditions:  -an eating disorder, such as anorexia or bulimia  -bipolar disorder or psychosis  -diabetes or high blood sugar, treated with medication  -glaucoma  -head injury or brain tumor  -heart disease, previous heart attack, or irregular heart beat  -high blood pressure  -kidney or liver disease  -seizures  -suicidal thoughts or a previous suicide attempt  -Tourette's syndrome  -weight loss  -an unusual or allergic reaction to bupropion, other medicines, foods, dyes, or preservatives  -breast-feeding  -pregnant or trying to become pregnant  How should I use this medicine?  Take this medicine by mouth with a glass of water. Follow the directions on the prescription label. You can take it with or without food. If it upsets your stomach, take it with food. Do not cut, crush or chew this medicine. Take your medicine at regular intervals. If you take this medicine more than once a day, take your second dose at least 8 hours after you take your first dose. To limit difficulty in sleeping, avoid taking this medicine at bedtime. Do not take your medicine more often than directed. Do not stop taking this medicine suddenly except upon the advice of your doctor. Stopping this medicine too quickly may cause serious side effects.  A special MedGuide will be given to you by the pharmacist with each prescription and refill. Be sure to read this information carefully each time.  Talk to your pediatrician regarding the use of this medicine in children. Special care may be needed.  Overdosage: If you think you have  taken too much of this medicine contact a poison control center or emergency room at once.  NOTE: This medicine is only for you. Do not share this medicine with others.  What if I miss a dose?  If you miss a dose, skip the missed dose and take your next tablet at the regular time. There should be at least 8 hours between doses. Do not take double or extra doses.  What may interact with this medicine?  Do not take this medicine with any of the following medications:  -linezolid  -MAOIs like Azilect, Carbex, Eldepryl, Marplan, Nardil, and Parnate  -methylene blue (injected into a vein)  -other medicines that contain bupropion like Wellbutrin  This medicine may also interact with the following medications:  -alcohol  -certain medicines for anxiety or sleep  -certain medicines for blood pressure like metoprolol, propranolol  -certain medicines for depression or psychotic disturbances  -certain medicines for HIV or AIDS like efavirenz, lopinavir, nelfinavir, ritonavir  -certain medicines for irregular heart beat like propafenone, flecainide  -certain medicines for Parkinson's disease like amantadine, levodopa  -certain medicines for seizures like carbamazepine, phenytoin, phenobarbital  -cimetidine  -clopidogrel  -cyclophosphamide  -digoxin  -furazolidone  -isoniazid  -nicotine  -orphenadrine  -procarbazine  -steroid medicines like prednisone or cortisone  -stimulant medicines for attention disorders, weight loss, or to stay awake  -tamoxifen  -theophylline  -thiotepa  -ticlopidine  -tramadol  -warfarin  This list may not describe all possible interactions. Give your health care provider a list   patient support program. It is important to participate in a behavioral program, counseling, or other support program that is recommended by your health care professional. Patients and their families should watch out for new or worsening thoughts of suicide or depression. Also watch out for sudden changes in feelings such as feeling anxious, agitated, panicky, irritable, hostile, aggressive, impulsive, severely restless, overly excited and hyperactive, or not being able to sleep. If this happens, especially at the beginning of treatment or after a change in dose, call your health care professional. Avoid alcoholic drinks while taking this medicine. Drinking excessive alcoholic beverages, using sleeping or anxiety medicines, or quickly stopping the use of these agents while taking this medicine may increase your risk for a seizure. Do not drive or use heavy machinery until you know how this medicine affects you. This medicine can impair your ability to perform these tasks. Do not take this medicine close to bedtime. It may prevent you from sleeping. Your mouth may get dry. Chewing sugarless gum or sucking hard candy, and drinking plenty of water may help. Contact your doctor if the problem does not go away or is severe. Do not use nicotine patches or chewing gum without the advice of your doctor or health care professional while taking this medicine. You may need to have your blood pressure taken regularly if your doctor recommends that you use both nicotine and this medicine together. What side effects may I notice from receiving this medicine? Side effects that you should report to your doctor or health care professional as soon as possible: -allergic reactions like skin rash, itching or hives, swelling of the face, lips, or tongue -breathing problems -changes in vision -confusion -elevated mood, decreased need for sleep, racing thoughts, impulsive  behavior -fast or irregular heartbeat -hallucinations, loss of contact with reality -increased blood pressure -redness, blistering, peeling or loosening of the skin, including inside the mouth -seizures -suicidal thoughts or other mood changes -unusually weak or tired -vomiting Side effects that usually do not require medical attention (report to your doctor or health care professional if they continue or are bothersome): -constipation -headache -loss of appetite -nausea -tremors -weight loss This list may not describe all possible side effects. Call your doctor for medical advice about side effects. You may report side effects to FDA at 1-800-FDA-1088. Where should I keep my medicine? Keep out of the reach of children. Store at room temperature between 20 and 25 degrees C (68 and 77 degrees F). Protect from light. Keep container tightly closed. Throw away any unused medicine after the expiration date. NOTE: This sheet is a summary. It may not cover all possible information. If you have questions about this medicine, talk to your doctor, pharmacist, or health care provider.  2018 Elsevier/Gold Standard (2016-02-19 13:49:28)  

## 2018-04-25 ENCOUNTER — Other Ambulatory Visit: Payer: Self-pay | Admitting: Family Medicine

## 2018-04-25 DIAGNOSIS — I10 Essential (primary) hypertension: Secondary | ICD-10-CM

## 2018-04-25 DIAGNOSIS — Z9861 Coronary angioplasty status: Principal | ICD-10-CM

## 2018-04-25 DIAGNOSIS — I2510 Atherosclerotic heart disease of native coronary artery without angina pectoris: Secondary | ICD-10-CM

## 2018-05-03 ENCOUNTER — Other Ambulatory Visit: Payer: Self-pay | Admitting: Family Medicine

## 2018-05-03 DIAGNOSIS — Z794 Long term (current) use of insulin: Principal | ICD-10-CM

## 2018-05-03 DIAGNOSIS — E1159 Type 2 diabetes mellitus with other circulatory complications: Secondary | ICD-10-CM

## 2018-05-09 ENCOUNTER — Other Ambulatory Visit: Payer: Self-pay | Admitting: Family Medicine

## 2018-05-09 DIAGNOSIS — Z720 Tobacco use: Secondary | ICD-10-CM

## 2018-05-10 ENCOUNTER — Other Ambulatory Visit: Payer: Self-pay | Admitting: Family Medicine

## 2018-05-10 NOTE — Telephone Encounter (Signed)
Pt needs new glucose meter.  [name] [name]

## 2018-05-11 MED ORDER — ACCU-CHEK AVIVA PLUS W/DEVICE KIT: PACK | 0 refills | Status: DC

## 2018-05-23 ENCOUNTER — Other Ambulatory Visit: Payer: Self-pay | Admitting: Family Medicine

## 2018-05-23 DIAGNOSIS — Z9861 Coronary angioplasty status: Principal | ICD-10-CM

## 2018-05-23 DIAGNOSIS — I2510 Atherosclerotic heart disease of native coronary artery without angina pectoris: Secondary | ICD-10-CM

## 2018-05-23 DIAGNOSIS — J449 Chronic obstructive pulmonary disease, unspecified: Secondary | ICD-10-CM

## 2018-05-23 DIAGNOSIS — I10 Essential (primary) hypertension: Secondary | ICD-10-CM

## 2018-05-24 ENCOUNTER — Ambulatory Visit: Payer: Medicaid Other | Admitting: Family Medicine

## 2018-05-24 ENCOUNTER — Other Ambulatory Visit: Payer: Self-pay

## 2018-05-24 ENCOUNTER — Encounter: Payer: Self-pay | Admitting: Family Medicine

## 2018-05-24 VITALS — BP 126/68 | HR 84 | Temp 98.7°F | Ht 67.0 in | Wt 287.0 lb

## 2018-05-24 DIAGNOSIS — Z9989 Dependence on other enabling machines and devices: Secondary | ICD-10-CM | POA: Diagnosis not present

## 2018-05-24 DIAGNOSIS — Z794 Long term (current) use of insulin: Secondary | ICD-10-CM

## 2018-05-24 DIAGNOSIS — I214 Non-ST elevation (NSTEMI) myocardial infarction: Secondary | ICD-10-CM

## 2018-05-24 DIAGNOSIS — G4733 Obstructive sleep apnea (adult) (pediatric): Secondary | ICD-10-CM

## 2018-05-24 DIAGNOSIS — Z23 Encounter for immunization: Secondary | ICD-10-CM

## 2018-05-24 DIAGNOSIS — E1159 Type 2 diabetes mellitus with other circulatory complications: Secondary | ICD-10-CM | POA: Diagnosis present

## 2018-05-24 DIAGNOSIS — I10 Essential (primary) hypertension: Secondary | ICD-10-CM

## 2018-05-24 DIAGNOSIS — Z720 Tobacco use: Secondary | ICD-10-CM

## 2018-05-24 DIAGNOSIS — Z1159 Encounter for screening for other viral diseases: Secondary | ICD-10-CM

## 2018-05-24 LAB — POCT GLYCOSYLATED HEMOGLOBIN (HGB A1C): HbA1c, POC (controlled diabetic range): 7.8 % — AB (ref 0.0–7.0)

## 2018-05-24 MED ORDER — NITROGLYCERIN 0.4 MG SL SUBL: SUBLINGUAL_TABLET | SUBLINGUAL | 2 refills | Status: DC

## 2018-05-24 NOTE — Patient Instructions (Signed)
Diabetes Mellitus and Nutrition When you have diabetes (diabetes mellitus), it is very important to have healthy eating habits because your blood sugar (glucose) levels are greatly affected by what you eat and drink. Eating healthy foods in the appropriate amounts, at about the same times every day, can help you:  Control your blood glucose.  Lower your risk of heart disease.  Improve your blood pressure.  Reach or maintain a healthy weight.  Every person with diabetes is different, and each person has different needs for a meal plan. Your health care provider may recommend that you work with a diet and nutrition specialist (dietitian) to make a meal plan that is best for you. Your meal plan may vary depending on factors such as:  The calories you need.  The medicines you take.  Your weight.  Your blood glucose, blood pressure, and cholesterol levels.  Your activity level.  Other health conditions you have, such as heart or kidney disease.  How do carbohydrates affect me? Carbohydrates affect your blood glucose level more than any other type of food. Eating carbohydrates naturally increases the amount of glucose in your blood. Carbohydrate counting is a method for keeping track of how many carbohydrates you eat. Counting carbohydrates is important to keep your blood glucose at a healthy level, especially if you use insulin or take certain oral diabetes medicines. It is important to know how many carbohydrates you can safely have in each meal. This is different for every person. Your dietitian can help you calculate how many carbohydrates you should have at each meal and for snack. Foods that contain carbohydrates include:  Bread, cereal, rice, pasta, and crackers.  Potatoes and corn.  Peas, beans, and lentils.  Milk and yogurt.  Fruit and juice.  Desserts, such as cakes, cookies, ice cream, and candy.  How does alcohol affect me? Alcohol can cause a sudden decrease in blood  glucose (hypoglycemia), especially if you use insulin or take certain oral diabetes medicines. Hypoglycemia can be a life-threatening condition. Symptoms of hypoglycemia (sleepiness, dizziness, and confusion) are similar to symptoms of having too much alcohol. If your health care provider says that alcohol is safe for you, follow these guidelines:  Limit alcohol intake to no more than 1 drink per day for nonpregnant women and 2 drinks per day for men. One drink equals 12 oz of beer, 5 oz of wine, or 1 oz of hard liquor.  Do not drink on an empty stomach.  Keep yourself hydrated with water, diet soda, or unsweetened iced tea.  Keep in mind that regular soda, juice, and other mixers may contain a lot of sugar and must be counted as carbohydrates.  What are tips for following this plan? Reading food labels  Start by checking the serving size on the label. The amount of calories, carbohydrates, fats, and other nutrients listed on the label are based on one serving of the food. Many foods contain more than one serving per package.  Check the total grams (g) of carbohydrates in one serving. You can calculate the number of servings of carbohydrates in one serving by dividing the total carbohydrates by 15. For example, if a food has 30 g of total carbohydrates, it would be equal to 2 servings of carbohydrates.  Check the number of grams (g) of saturated and trans fats in one serving. Choose foods that have low or no amount of these fats.  Check the number of milligrams (mg) of sodium in one serving. Most people   should limit total sodium intake to less than 2,300 mg per day.  Always check the nutrition information of foods labeled as "low-fat" or "nonfat". These foods may be higher in added sugar or refined carbohydrates and should be avoided.  Talk to your dietitian to identify your daily goals for nutrients listed on the label. Shopping  Avoid buying canned, premade, or processed foods. These  foods tend to be high in fat, sodium, and added sugar.  Shop around the outside edge of the grocery store. This includes fresh fruits and vegetables, bulk grains, fresh meats, and fresh dairy. Cooking  Use low-heat cooking methods, such as baking, instead of high-heat cooking methods like deep frying.  Cook using healthy oils, such as olive, canola, or sunflower oil.  Avoid cooking with butter, cream, or high-fat meats. Meal planning  Eat meals and snacks regularly, preferably at the same times every day. Avoid going long periods of time without eating.  Eat foods high in fiber, such as fresh fruits, vegetables, beans, and whole grains. Talk to your dietitian about how many servings of carbohydrates you can eat at each meal.  Eat 4-6 ounces of lean protein each day, such as lean meat, chicken, fish, eggs, or tofu. 1 ounce is equal to 1 ounce of meat, chicken, or fish, 1 egg, or 1/4 cup of tofu.  Eat some foods each day that contain healthy fats, such as avocado, nuts, seeds, and fish. Lifestyle   Check your blood glucose regularly.  Exercise at least 30 minutes 5 or more days each week, or as told by your health care provider.  Take medicines as told by your health care provider.  Do not use any products that contain nicotine or tobacco, such as cigarettes and e-cigarettes. If you need help quitting, ask your health care provider.  Work with a counselor or diabetes educator to identify strategies to manage stress and any emotional and social challenges. What are some questions to ask my health care provider?  Do I need to meet with a diabetes educator?  Do I need to meet with a dietitian?  What number can I call if I have questions?  When are the best times to check my blood glucose? Where to find more information:  American Diabetes Association: diabetes.org/food-and-fitness/food  Academy of Nutrition and Dietetics:  www.eatright.org/resources/health/diseases-and-conditions/diabetes  National Institute of Diabetes and Digestive and Kidney Diseases (NIH): www.niddk.nih.gov/health-information/diabetes/overview/diet-eating-physical-activity Summary  A healthy meal plan will help you control your blood glucose and maintain a healthy lifestyle.  Working with a diet and nutrition specialist (dietitian) can help you make a meal plan that is best for you.  Keep in mind that carbohydrates and alcohol have immediate effects on your blood glucose levels. It is important to count carbohydrates and to use alcohol carefully. This information is not intended to replace advice given to you by your health care provider. Make sure you discuss any questions you have with your health care provider. Document Released: 05/26/2005 Document Revised: 10/03/2016 Document Reviewed: 10/03/2016 Elsevier Interactive Patient Education  2018 Elsevier Inc.  

## 2018-05-24 NOTE — Progress Notes (Signed)
   Subjective:    Patient ID: [name] [name] is a 57 y.o. male presenting with Diabetes  on 05/24/2018  HPI: Has now quit smoking x 120 days.  Reports CBGs in the 95-195. Eating more since not smoking. Starting weight loss program through Medicare. Having some chest pain occasionally when it is hot outside. Flu shot today  Review of Systems  Constitutional: Negative for chills and fever.  Respiratory: Negative for shortness of breath.   Cardiovascular: Negative for leg swelling.  Gastrointestinal: Negative for abdominal pain, nausea and vomiting.      Objective:    BP 126/68   Pulse 84   Temp 98.7 F (37.1 C) (Oral)   Ht 5\' 7"  (1.702 m)   Wt 287 lb (130.2 kg)   SpO2 93%   BMI 44.95 kg/m  Physical Exam  Constitutional: He appears well-developed and well-nourished. No distress.  HENT:  Head: Normocephalic and atraumatic.  Eyes: No scleral icterus.  Neck: Neck supple.  Cardiovascular: Normal rate.  Pulmonary/Chest: Effort normal.  Abdominal: Soft.  Musculoskeletal: He exhibits no edema.  Neurological: He is alert.  Skin: Skin is warm.  Psychiatric: He has a normal mood and affect.  Vitals reviewed.   A1C 7,8    Assessment & Plan:   Problem List Items Addressed This Visit      High   H/O NSTEMI (non-ST elevated myocardial infarction) (Chronic)    refille NTG, continue statin, ASA, Pasugrel, Carvedilol      Relevant Medications   nitroGLYCERIN (NITROSTAT) 0.4 MG SL tablet     Medium   Essential hypertension (Chronic)    Continue Lisinopril      Relevant Medications   nitroGLYCERIN (NITROSTAT) 0.4 MG SL tablet   OSA on CPAP (Chronic)    Needs new cord--he is to call company and see if they will replace      DM type 2 (diabetes mellitus, type 2) ([place]) - Primary    Continue Metformin, Jardiance, Lantus and Novolog      Relevant Orders   HgB A1c (Completed)   Tobacco abuse    Congratulations on Quitting! Keep up the good work!       Other Visit  Diagnoses    Need for hepatitis C screening test       Relevant Orders   Hepatitis C Antibody (Completed)   Need for immunization against influenza       Relevant Orders   Flu Vaccine QUAD 36+ mos IM (Completed)      Total face-to-face time with patient: 15 minutes. Over 50% of encounter was spent on counseling and coordination of care. Return in about 3 months (around 08/23/2018).  [name] 05/24/2018 1:34 PM

## 2018-05-25 LAB — HEPATITIS C ANTIBODY: Hep C Virus Ab: <0.1 s/co ratio (ref 0.0–0.9)

## 2018-05-26 ENCOUNTER — Encounter: Payer: Self-pay | Admitting: Family Medicine

## 2018-05-26 NOTE — Assessment & Plan Note (Addendum)
refille NTG, continue statin, ASA, Pasugrel, Carvedilol

## 2018-05-26 NOTE — Assessment & Plan Note (Signed)
Needs new cord--he is to call company and see if they will replace

## 2018-05-26 NOTE — Assessment & Plan Note (Signed)
Congratulations on Quitting! Keep up the good work!

## 2018-05-26 NOTE — Assessment & Plan Note (Signed)
Continue Lisinopril 

## 2018-05-26 NOTE — Assessment & Plan Note (Signed)
Continue Metformin, Jardiance, Lantus and Novolog

## 2018-06-07 ENCOUNTER — Other Ambulatory Visit: Payer: Self-pay | Admitting: Family Medicine

## 2018-06-07 DIAGNOSIS — Z794 Long term (current) use of insulin: Principal | ICD-10-CM

## 2018-06-07 DIAGNOSIS — E1159 Type 2 diabetes mellitus with other circulatory complications: Secondary | ICD-10-CM

## 2018-06-09 ENCOUNTER — Other Ambulatory Visit: Payer: Self-pay | Admitting: Family Medicine

## 2018-06-09 DIAGNOSIS — Z720 Tobacco use: Secondary | ICD-10-CM

## 2018-06-19 ENCOUNTER — Ambulatory Visit: Payer: Medicaid Other | Admitting: Podiatry

## 2018-06-19 ENCOUNTER — Encounter: Payer: Self-pay | Admitting: Podiatry

## 2018-06-19 DIAGNOSIS — M79675 Pain in left toe(s): Secondary | ICD-10-CM

## 2018-06-19 DIAGNOSIS — B351 Tinea unguium: Secondary | ICD-10-CM

## 2018-06-19 DIAGNOSIS — E119 Type 2 diabetes mellitus without complications: Secondary | ICD-10-CM

## 2018-06-19 DIAGNOSIS — M79674 Pain in right toe(s): Secondary | ICD-10-CM

## 2018-06-20 ENCOUNTER — Other Ambulatory Visit: Payer: Self-pay | Admitting: Family Medicine

## 2018-06-20 DIAGNOSIS — I10 Essential (primary) hypertension: Secondary | ICD-10-CM

## 2018-06-20 DIAGNOSIS — Z9861 Coronary angioplasty status: Principal | ICD-10-CM

## 2018-06-20 DIAGNOSIS — I2510 Atherosclerotic heart disease of native coronary artery without angina pectoris: Secondary | ICD-10-CM

## 2018-06-24 NOTE — Progress Notes (Signed)
Subjective:   Patient ID: [name] [name], male   DOB: 57 y.o.   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

## 2018-07-07 ENCOUNTER — Other Ambulatory Visit: Payer: Self-pay | Admitting: Family Medicine

## 2018-07-07 DIAGNOSIS — Z720 Tobacco use: Secondary | ICD-10-CM

## 2018-07-10 ENCOUNTER — Other Ambulatory Visit: Payer: Self-pay | Admitting: Family Medicine

## 2018-07-10 DIAGNOSIS — M545 Low back pain, unspecified: Secondary | ICD-10-CM

## 2018-07-11 ENCOUNTER — Other Ambulatory Visit: Payer: Self-pay | Admitting: Family Medicine

## 2018-07-11 DIAGNOSIS — E1159 Type 2 diabetes mellitus with other circulatory complications: Secondary | ICD-10-CM

## 2018-07-11 DIAGNOSIS — Z794 Long term (current) use of insulin: Principal | ICD-10-CM

## 2018-07-11 DIAGNOSIS — I10 Essential (primary) hypertension: Secondary | ICD-10-CM

## 2018-07-19 ENCOUNTER — Encounter: Payer: Self-pay | Admitting: Pharmacist

## 2018-07-19 ENCOUNTER — Ambulatory Visit: Payer: Medicaid Other | Admitting: Pharmacist

## 2018-07-19 ENCOUNTER — Other Ambulatory Visit: Payer: Self-pay | Admitting: Family Medicine

## 2018-07-19 VITALS — BP 110/58 | HR 74 | Wt 291.6 lb

## 2018-07-19 DIAGNOSIS — J449 Chronic obstructive pulmonary disease, unspecified: Secondary | ICD-10-CM | POA: Diagnosis not present

## 2018-07-19 DIAGNOSIS — E1159 Type 2 diabetes mellitus with other circulatory complications: Secondary | ICD-10-CM

## 2018-07-19 DIAGNOSIS — I2510 Atherosclerotic heart disease of native coronary artery without angina pectoris: Secondary | ICD-10-CM

## 2018-07-19 DIAGNOSIS — I10 Essential (primary) hypertension: Secondary | ICD-10-CM

## 2018-07-19 DIAGNOSIS — Z9861 Coronary angioplasty status: Principal | ICD-10-CM

## 2018-07-19 DIAGNOSIS — Z794 Long term (current) use of insulin: Secondary | ICD-10-CM | POA: Diagnosis not present

## 2018-07-19 MED ORDER — FLUTICASONE FUROATE-VILANTEROL 100-25 MCG/INH IN AEPB: 1.0000 | INHALATION_SPRAY | Freq: Every day | RESPIRATORY_TRACT | 0 refills | Status: DC

## 2018-07-19 NOTE — Assessment & Plan Note (Signed)
Diabetes longstanding currently stable with goal A1c <8%. Patient is able to verbalize appropriate hypoglycemia management plan. Patient is adherent with medication. Control is suboptimal due to dietary choices. - Continue metformin 1000 mg twice daily, Jardiance 25 mg once daily, Victoza 1.8 mg once daily, Lantus 45 units twice daily, and Novolog 50 units twice daily.  -Counseled on ways to decrease carbohydrate consumption; will send message to Dr. Jenne Campus for scheduling  -Counseled on s/sx of and management of hypoglycemia -Next A1C anticipated 08/2018

## 2018-07-19 NOTE — Progress Notes (Addendum)
    S:     Chief Complaint  Patient presents with  . Medication Management    Diabetes     Patient arrivesin good spirits. Presents for lung function evaluation and diabetes evaluation, education, and management at the request ofDr. Darron Doom and as a follow up to last pharmacy visit on 12/25/17. Patient was referredby Dr. Marisa Hua 03/01/2018. He was last seen in [facility] on 03/29/2018 - at that time, he was counseled to avoid taking a third dose of Lantus for hyperglycemia, but that he could take a third dose of Novolog.  He continues to endorse tobacco cessation.Patient has been taking Dulera at home, not been taking Breo, but typically alternates based on clinic sample availability.   He notes that he feels like his appetite has been heavier recently, and he feels like he has been eating more. He is interested in a referral to a nutritionist.   Insurance coverage/medication affordability: Medicaid  Patient reports adherence with medications.  Current diabetes medications include: Jardiance 25 mg, Lantus 45 units BID, Novolog 50 units BID, occasionally third dose; Victoza 1.8 mg daily, metformin 1000 BID  Current hypertension medications include: carvedilol 3.125 mg BID  Patient denies hypoglycemic events.  Patient reported dietary habits: Eats 3-4 meals/day Breakfast: Peanut butter and jelly sandwich; 2% milk; cheerios sometimes;  Lunch: [place] sandwich + mustard, cheese, miracle whip; orange;  Dinner: Chicken, broccoli; rice; sometimes steak;  Snacks: Coffee cake; yogurt, sherbert  Drinks: pepsi zero; Icees  Patient-reported exercise habits: Walks 3x/week for about 15 minutes until he gets tired (SOB) or chest pain;    Patient denies nocturia.  Patient denies neuropathy. Patient denies visual changes. Patient reports self foot exams.   O:  Physical Exam  Constitutional: He appears well-developed and well-nourished.  Vitals reviewed.    Review of Systems  All  other systems reviewed and are negative.    Lab Results  Component Value Date   HGBA1C 7.8 (A) 05/24/2018   Vitals:   07/19/18 1351  BP: (!) 110/58  Pulse: 74  SpO2: 94%    Lipid Panel     Component Value Date/Time   CHOL 118 04/10/2018 1507   TRIG 150 (H) 04/10/2018 1507   HDL 35 (L) 04/10/2018 1507   CHOLHDL 3.5 04/03/2017 1451   CHOLHDL 3.7 03/29/2016 1227   VLDL 53 (H) 03/29/2016 1227   LDLCALC 53 04/10/2018 1507    Home fasting CBG: 197 this morning, but typically less than 150 2 hour post-prandial/random CBG: <150 typically   Clinical ASCVD: Yes   A/P: Diabetes longstanding currently stable with goal A1c <8%. Patient is able to verbalize appropriate hypoglycemia management plan. Patient is adherent with medication. Control is suboptimal due to dietary choices. - Continue metformin 1000 mg twice daily, Jardiance 25 mg once daily, Victoza 1.8 mg once daily, Lantus 45 units twice daily, and Novolog 50 units twice daily.  -Counseled on ways to decrease carbohydrate consumption; will send message to Dr. Jenne Campus for scheduling  -Counseled on s/sx of and management of hypoglycemia -Next A1C anticipated 08/2018  COPD - Stable - doing well.  Requested samples.  Provided Breo 200/25 1 month supply.   Written patient instructions provided.  Total time in face to face counseling 30 minutes.   Follow up Pharmacist Clinic Visit in 2 months.   Patient seen with Andee Poles, PharmD Candidate, Harrietta Guardian, PharmD, PGY1 Pharmacy Resident, and Catie Darnelle Maffucci, PharmD,  PGY2 Pharmacy Resident.

## 2018-07-19 NOTE — Patient Instructions (Addendum)
It was great to see you today!  We are not going to make any medication changes today. Continue metformin 1000 mg twice daily, Jardiance 25 mg once daily, Victoza 1.8 mg once daily, Lantus 45 units twice daily, and Novolog 50 units twice daily.   We are going to send our nutritionist a message about calling you to schedule an appointment.   Cutting back on carbohydrates (breads, sugars) will help your sugars. If you like making sandwiches, try just using 1 piece of bread instead of 2. Tonight, try just eating half of the baked potato instead of the whole potato.   Try walking for a little bit longer - instead of walking 15 minutes three times a week, try walking for 20 minutes three times a week.   Schedule an appointment with [place] in January.

## 2018-08-05 ENCOUNTER — Other Ambulatory Visit: Payer: Self-pay | Admitting: Family Medicine

## 2018-08-05 DIAGNOSIS — J449 Chronic obstructive pulmonary disease, unspecified: Secondary | ICD-10-CM

## 2018-08-07 ENCOUNTER — Other Ambulatory Visit: Payer: Self-pay | Admitting: Family Medicine

## 2018-08-07 DIAGNOSIS — I10 Essential (primary) hypertension: Secondary | ICD-10-CM

## 2018-08-07 DIAGNOSIS — Z9861 Coronary angioplasty status: Principal | ICD-10-CM

## 2018-08-07 DIAGNOSIS — I2510 Atherosclerotic heart disease of native coronary artery without angina pectoris: Secondary | ICD-10-CM

## 2018-08-10 ENCOUNTER — Other Ambulatory Visit: Payer: Self-pay | Admitting: Family Medicine

## 2018-08-10 DIAGNOSIS — Z720 Tobacco use: Secondary | ICD-10-CM

## 2018-08-14 ENCOUNTER — Ambulatory Visit: Payer: Medicaid Other | Admitting: Family Medicine

## 2018-08-14 ENCOUNTER — Encounter: Payer: Self-pay | Admitting: Family Medicine

## 2018-08-14 ENCOUNTER — Other Ambulatory Visit: Payer: Self-pay

## 2018-08-14 VITALS — BP 134/78 | HR 84 | Temp 98.8°F | Ht 67.0 in | Wt 296.0 lb

## 2018-08-14 DIAGNOSIS — I2510 Atherosclerotic heart disease of native coronary artery without angina pectoris: Secondary | ICD-10-CM

## 2018-08-14 DIAGNOSIS — I10 Essential (primary) hypertension: Secondary | ICD-10-CM

## 2018-08-14 DIAGNOSIS — H9193 Unspecified hearing loss, bilateral: Secondary | ICD-10-CM

## 2018-08-14 DIAGNOSIS — I214 Non-ST elevation (NSTEMI) myocardial infarction: Secondary | ICD-10-CM | POA: Diagnosis not present

## 2018-08-14 DIAGNOSIS — Z9861 Coronary angioplasty status: Secondary | ICD-10-CM

## 2018-08-14 DIAGNOSIS — M545 Low back pain, unspecified: Secondary | ICD-10-CM

## 2018-08-14 DIAGNOSIS — Z794 Long term (current) use of insulin: Secondary | ICD-10-CM

## 2018-08-14 DIAGNOSIS — E1159 Type 2 diabetes mellitus with other circulatory complications: Secondary | ICD-10-CM

## 2018-08-14 LAB — POCT GLYCOSYLATED HEMOGLOBIN (HGB A1C): HBA1C, POC (CONTROLLED DIABETIC RANGE): 7.9 % — AB (ref 0.0–7.0)

## 2018-08-14 MED ORDER — TRAMADOL HCL 50 MG PO TABS: 50.0000 mg | ORAL_TABLET | Freq: Four times a day (QID) | ORAL | 0 refills | Status: DC | PRN

## 2018-08-14 MED ORDER — GLUCOSE BLOOD VI STRP: ORAL_STRIP | 11 refills | Status: DC

## 2018-08-14 MED ORDER — INSULIN ASPART 100 UNIT/ML ~~LOC~~ SOLN: 1.0000 [IU] | Freq: Three times a day (TID) | SUBCUTANEOUS | 11 refills | Status: DC

## 2018-08-14 MED ORDER — NITROGLYCERIN 0.4 MG SL SUBL: SUBLINGUAL_TABLET | SUBLINGUAL | 2 refills | Status: DC

## 2018-08-14 MED ORDER — "INSULIN SYRINGE-NEEDLE U-100 31G X 5/16"" 1 ML MISC": 3 refills | Status: DC

## 2018-08-14 NOTE — Assessment & Plan Note (Signed)
On jardiance, metformin, Victoza, Novolog and Lantus, A1C is < 8

## 2018-08-14 NOTE — Assessment & Plan Note (Signed)
Continue Coreg, ASA, Lipitor, Effient

## 2018-08-14 NOTE — Progress Notes (Signed)
   Subjective:    Patient ID: [name] [name] is a 57 y.o. male presenting with Diabetes  on 08/14/2018  HPI: Has CPAP with him and the dog has chewed the cord, needs this replaced. Would like to see ENT--needs hearing aids, if gets Medicare. neds med refill Has gained some weight Needs to cut down on peanut butter sandwiches. 6 months free from smoking  Review of Systems  Constitutional: Negative for chills and fever.  Respiratory: Negative for shortness of breath.   Cardiovascular: Negative for leg swelling.  Gastrointestinal: Negative for abdominal pain, nausea and vomiting.      Objective:    BP 134/78   Pulse 84   Temp 98.8 F (37.1 C) (Oral)   Ht 5\' 7"  (1.702 m)   Wt 296 lb (134.3 kg)   SpO2 94%   BMI 46.36 kg/m  Physical Exam  Constitutional: He appears well-developed and well-nourished. No distress.  HENT:  Head: Normocephalic and atraumatic.  Eyes: No scleral icterus.  Neck: Neck supple.  Cardiovascular: Normal rate.  Pulmonary/Chest: Effort normal.  Abdominal: Soft.  Musculoskeletal: He exhibits no edema.  Neurological: He is alert.  Skin: Skin is warm.  Psychiatric: He has a normal mood and affect.  Vitals reviewed.       Assessment & Plan:   Problem List Items Addressed This Visit      High   H/O NSTEMI (non-ST elevated myocardial infarction) (Chronic)    Continue Coreg, ASA, Lipitor, Effient      Relevant Medications   nitroGLYCERIN (NITROSTAT) 0.4 MG SL tablet     Medium   Essential hypertension (Chronic)    BP is well controlled on lisinopril, Coreg      Relevant Medications   nitroGLYCERIN (NITROSTAT) 0.4 MG SL tablet   CAD S/P percutaneous coronary angioplasty (Chronic)   Relevant Medications   nitroGLYCERIN (NITROSTAT) 0.4 MG SL tablet   DM type 2 (diabetes mellitus, type 2) ([facility]) - Primary    On jardiance, metformin, Victoza, Novolog and Lantus, A1C is < 8      Relevant Medications   glucose blood (ACCU-CHEK AVIVA PLUS) test  strip   insulin aspart (NOVOLOG) 100 UNIT/ML injection   Other Relevant Orders   HgB A1c (Completed)     Unprioritized   Hearing difficulty    Needs ears cleaned out and possibly hearing aids if gets Medicare      Relevant Orders   Ambulatory referral to ENT    Other Visit Diagnoses    Bilateral low back pain without sciatica       have refilled Ultram--rarely uses.   Relevant Medications   traMADol (ULTRAM) 50 MG tablet      Total face-to-face time with patient: 25 minutes. Over 50% of encounter was spent on counseling and coordination of care. Return in about 3 months (around 11/13/2018).  [name] 08/14/2018 1:41 PM

## 2018-08-14 NOTE — Assessment & Plan Note (Signed)
BP is well controlled on lisinopril, Coreg

## 2018-08-14 NOTE — Patient Instructions (Signed)
Calorie Counting for Weight Loss Calories are units of energy. Your body needs a certain amount of calories from food to keep you going throughout the day. When you eat more calories than your body needs, your body stores the extra calories as fat. When you eat fewer calories than your body needs, your body burns fat to get the energy it needs. Calorie counting means keeping track of how many calories you eat and drink each day. Calorie counting can be helpful if you need to lose weight. If you make sure to eat fewer calories than your body needs, you should lose weight. Ask your health care provider what a healthy weight is for you. For calorie counting to work, you will need to eat the right number of calories in a day in order to lose a healthy amount of weight per week. A dietitian can help you determine how many calories you need in a day and will give you suggestions on how to reach your calorie goal.  A healthy amount of weight to lose per week is usually 1-2 lb (0.5-0.9 kg). This usually means that your daily calorie intake should be reduced by 500-750 calories.  Eating 1,200 - 1,500 calories per day can help most women lose weight.  Eating 1,500 - 1,800 calories per day can help most men lose weight.  What is my plan? My goal is to have __________ calories per day. If I have this many calories per day, I should lose around __________ pounds per week. What do I need to know about calorie counting? In order to meet your daily calorie goal, you will need to:  Find out how many calories are in each food you would like to eat. Try to do this before you eat.  Decide how much of the food you plan to eat.  Write down what you ate and how many calories it had. Doing this is called keeping a food log.  To successfully lose weight, it is important to balance calorie counting with a healthy lifestyle that includes regular activity. Aim for 150 minutes of moderate exercise (such as walking) or 75  minutes of vigorous exercise (such as running) each week. Where do I find calorie information?  The number of calories in a food can be found on a Nutrition Facts label. If a food does not have a Nutrition Facts label, try to look up the calories online or ask your dietitian for help. Remember that calories are listed per serving. If you choose to have more than one serving of a food, you will have to multiply the calories per serving by the amount of servings you plan to eat. For example, the label on a package of bread might say that a serving size is 1 slice and that there are 90 calories in a serving. If you eat 1 slice, you will have eaten 90 calories. If you eat 2 slices, you will have eaten 180 calories. How do I keep a food log? Immediately after each meal, record the following information in your food log:  What you ate. Don't forget to include toppings, sauces, and other extras on the food.  How much you ate. This can be measured in cups, ounces, or number of items.  How many calories each food and drink had.  The total number of calories in the meal.  Keep your food log near you, such as in a small notebook in your pocket, or use a mobile app or website. Some   programs will calculate calories for you and show you how many calories you have left for the day to meet your goal. What are some calorie counting tips?  Use your calories on foods and drinks that will fill you up and not leave you hungry: ? Some examples of foods that fill you up are nuts and nut butters, vegetables, lean proteins, and high-fiber foods like whole grains. High-fiber foods are foods with more than 5 g fiber per serving. ? Drinks such as sodas, specialty coffee drinks, alcohol, and juices have a lot of calories, yet do not fill you up.  Eat nutritious foods and avoid empty calories. Empty calories are calories you get from foods or beverages that do not have many vitamins or protein, such as candy, sweets, and  soda. It is better to have a nutritious high-calorie food (such as an avocado) than a food with few nutrients (such as a bag of chips).  Know how many calories are in the foods you eat most often. This will help you calculate calorie counts faster.  Pay attention to calories in drinks. Low-calorie drinks include water and unsweetened drinks.  Pay attention to nutrition labels for "low fat" or "fat free" foods. These foods sometimes have the same amount of calories or more calories than the full fat versions. They also often have added sugar, starch, or salt, to make up for flavor that was removed with the fat.  Find a way of tracking calories that works for you. Get creative. Try different apps or programs if writing down calories does not work for you. What are some portion control tips?  Know how many calories are in a serving. This will help you know how many servings of a certain food you can have.  Use a measuring cup to measure serving sizes. You could also try weighing out portions on a kitchen scale. With time, you will be able to estimate serving sizes for some foods.  Take some time to put servings of different foods on your favorite plates, bowls, and cups so you know what a serving looks like.  Try not to eat straight from a bag or box. Doing this can lead to overeating. Put the amount you would like to eat in a cup or on a plate to make sure you are eating the right portion.  Use smaller plates, glasses, and bowls to prevent overeating.  Try not to multitask (for example, watch TV or use your computer) while eating. If it is time to eat, sit down at a table and enjoy your food. This will help you to know when you are full. It will also help you to be aware of what you are eating and how much you are eating. What are tips for following this plan? Reading food labels  Check the calorie count compared to the serving size. The serving size may be smaller than what you are used to  eating.  Check the source of the calories. Make sure the food you are eating is high in vitamins and protein and low in saturated and trans fats. Shopping  Read nutrition labels while you shop. This will help you make healthy decisions before you decide to purchase your food.  Make a grocery list and stick to it. Cooking  Try to cook your favorite foods in a healthier way. For example, try baking instead of frying.  Use low-fat dairy products. Meal planning  Use more fruits and vegetables. Half of your plate should   be fruits and vegetables.  Include lean proteins like poultry and fish. How do I count calories when eating out?  Ask for smaller portion sizes.  Consider sharing an entree and sides instead of getting your own entree.  If you get your own entree, eat only half. Ask for a box at the beginning of your meal and put the rest of your entree in it so you are not tempted to eat it.  If calories are listed on the menu, choose the lower calorie options.  Choose dishes that include vegetables, fruits, whole grains, low-fat dairy products, and lean protein.  Choose items that are boiled, broiled, grilled, or steamed. Stay away from items that are buttered, battered, fried, or served with cream sauce. Items labeled "crispy" are usually fried, unless stated otherwise.  Choose water, low-fat milk, unsweetened iced tea, or other drinks without added sugar. If you want an alcoholic beverage, choose a lower calorie option such as a glass of wine or light beer.  Ask for dressings, sauces, and syrups on the side. These are usually high in calories, so you should limit the amount you eat.  If you want a salad, choose a garden salad and ask for grilled meats. Avoid extra toppings like bacon, cheese, or fried items. Ask for the dressing on the side, or ask for olive oil and vinegar or lemon to use as dressing.  Estimate how many servings of a food you are given. For example, a serving of  cooked rice is  cup or about the size of half a baseball. Knowing serving sizes will help you be aware of how much food you are eating at restaurants. The list below tells you how big or small some common portion sizes are based on everyday objects: ? 1 oz-4 stacked dice. ? 3 oz-1 deck of cards. ? 1 tsp-1 die. ? 1 Tbsp- a ping-pong ball. ? 2 Tbsp-1 ping-pong ball. ?  cup- baseball. ? 1 cup-1 baseball. Summary  Calorie counting means keeping track of how many calories you eat and drink each day. If you eat fewer calories than your body needs, you should lose weight.  A healthy amount of weight to lose per week is usually 1-2 lb (0.5-0.9 kg). This usually means reducing your daily calorie intake by 500-750 calories.  The number of calories in a food can be found on a Nutrition Facts label. If a food does not have a Nutrition Facts label, try to look up the calories online or ask your dietitian for help.  Use your calories on foods and drinks that will fill you up, and not on foods and drinks that will leave you hungry.  Use smaller plates, glasses, and bowls to prevent overeating. This information is not intended to replace advice given to you by your health care provider. Make sure you discuss any questions you have with your health care provider. Document Released: 08/29/2005 Document Revised: 07/29/2016 Document Reviewed: 07/29/2016 Elsevier Interactive Patient Education  2018 [place]. Sleep Apnea Sleep apnea is a condition that affects breathing. People with sleep apnea have moments during sleep when their breathing pauses briefly or gets shallow. Sleep apnea can cause these symptoms:  Trouble staying asleep.  Sleepiness or tiredness during the day.  Irritability.  Loud snoring.  Morning headaches.  Trouble concentrating.  Forgetting things.  Less interest in sex.  Being sleepy for no reason.  Mood swings.  Personality changes.  Depression.  Waking up a  lot during the night to  pee (urinate).  Dry mouth.  Sore throat.  Follow these instructions at home:  Make any changes in your routine that your doctor recommends.  Eat a healthy, well-balanced diet.  Take over-the-counter and prescription medicines only as told by your doctor.  Avoid using alcohol, calming medicines (sedatives), and narcotic medicines.  Take steps to lose weight if you are overweight.  If you were given a machine (device) to use while you sleep, use it only as told by your doctor.  Do not use any tobacco products, such as cigarettes, chewing tobacco, and e-cigarettes. If you need help quitting, ask your doctor.  Keep all follow-up visits as told by your doctor. This is important. Contact a doctor if:  The machine that you were given to use during sleep is uncomfortable or does not seem to be working.  Your symptoms do not get better.  Your symptoms get worse. Get help right away if:  Your chest hurts.  You have trouble breathing in enough air (shortness of breath).  You have an uncomfortable feeling in your back, arms, or stomach.  You have trouble talking.  One side of your body feels weak.  A part of your face is hanging down (drooping). These symptoms may be an emergency. Do not wait to see if the symptoms will go away. Get medical help right away. Call your local emergency services (911 in the [place] Do not drive yourself to the hospital. This information is not intended to replace advice given to you by your health care provider. Make sure you discuss any questions you have with your health care provider. Document Released: 06/07/2008 Document Revised: 04/24/2016 Document Reviewed: 06/08/2015 Elsevier Interactive Patient Education  [employer].

## 2018-08-14 NOTE — Assessment & Plan Note (Signed)
Needs ears cleaned out and possibly hearing aids if gets Medicare

## 2018-08-15 ENCOUNTER — Telehealth: Payer: Self-pay

## 2018-08-15 ENCOUNTER — Other Ambulatory Visit: Payer: Self-pay

## 2018-08-15 MED ORDER — "INSULIN SYRINGE-NEEDLE U-100 31G X 5/16"" 1 ML MISC": 3 refills | Status: DC

## 2018-08-15 NOTE — Telephone Encounter (Signed)
error 

## 2018-08-15 NOTE — Telephone Encounter (Signed)
Please put number of times used daily in directions for insurance purposes.  Danley Danker, RN [facility] RN)

## 2018-08-25 ENCOUNTER — Other Ambulatory Visit: Payer: Self-pay | Admitting: Family Medicine

## 2018-08-25 DIAGNOSIS — E1159 Type 2 diabetes mellitus with other circulatory complications: Secondary | ICD-10-CM

## 2018-08-25 DIAGNOSIS — Z794 Long term (current) use of insulin: Principal | ICD-10-CM

## 2018-09-09 ENCOUNTER — Other Ambulatory Visit: Payer: Self-pay | Admitting: Family Medicine

## 2018-09-09 DIAGNOSIS — I2510 Atherosclerotic heart disease of native coronary artery without angina pectoris: Secondary | ICD-10-CM

## 2018-09-09 DIAGNOSIS — Z9861 Coronary angioplasty status: Secondary | ICD-10-CM

## 2018-09-09 DIAGNOSIS — Z720 Tobacco use: Secondary | ICD-10-CM

## 2018-09-09 DIAGNOSIS — I10 Essential (primary) hypertension: Secondary | ICD-10-CM

## 2018-09-15 ENCOUNTER — Other Ambulatory Visit: Payer: Self-pay | Admitting: Family Medicine

## 2018-09-15 DIAGNOSIS — J449 Chronic obstructive pulmonary disease, unspecified: Secondary | ICD-10-CM

## 2018-09-20 ENCOUNTER — Telehealth: Payer: Self-pay | Admitting: Pharmacist

## 2018-09-20 ENCOUNTER — Ambulatory Visit: Payer: Medicaid Other | Admitting: Pharmacist

## 2018-09-20 NOTE — Telephone Encounter (Signed)
Contacted patient to follow up on medication management/blood sugars, as we had to reschedule appointment with Dr. Valentina Lucks today.  Left HIPAA compliant message for patient to return my call at his convenience.   Catie Darnelle Maffucci, PharmD PGY2 Ambulatory Care Pharmacy Resident, [facility] Phone: [phone]

## 2018-09-21 ENCOUNTER — Ambulatory Visit: Payer: Medicaid Other | Admitting: Podiatry

## 2018-09-21 IMAGING — CR DG CHEST 2V
2 series · 2 of 2 positions shown · non-contrast
Comparison: 11/22/2014.

CLINICAL DATA: Intermittent chest pain, worse yesterday.

EXAM:
CHEST  2 VIEW

[w chest pa]
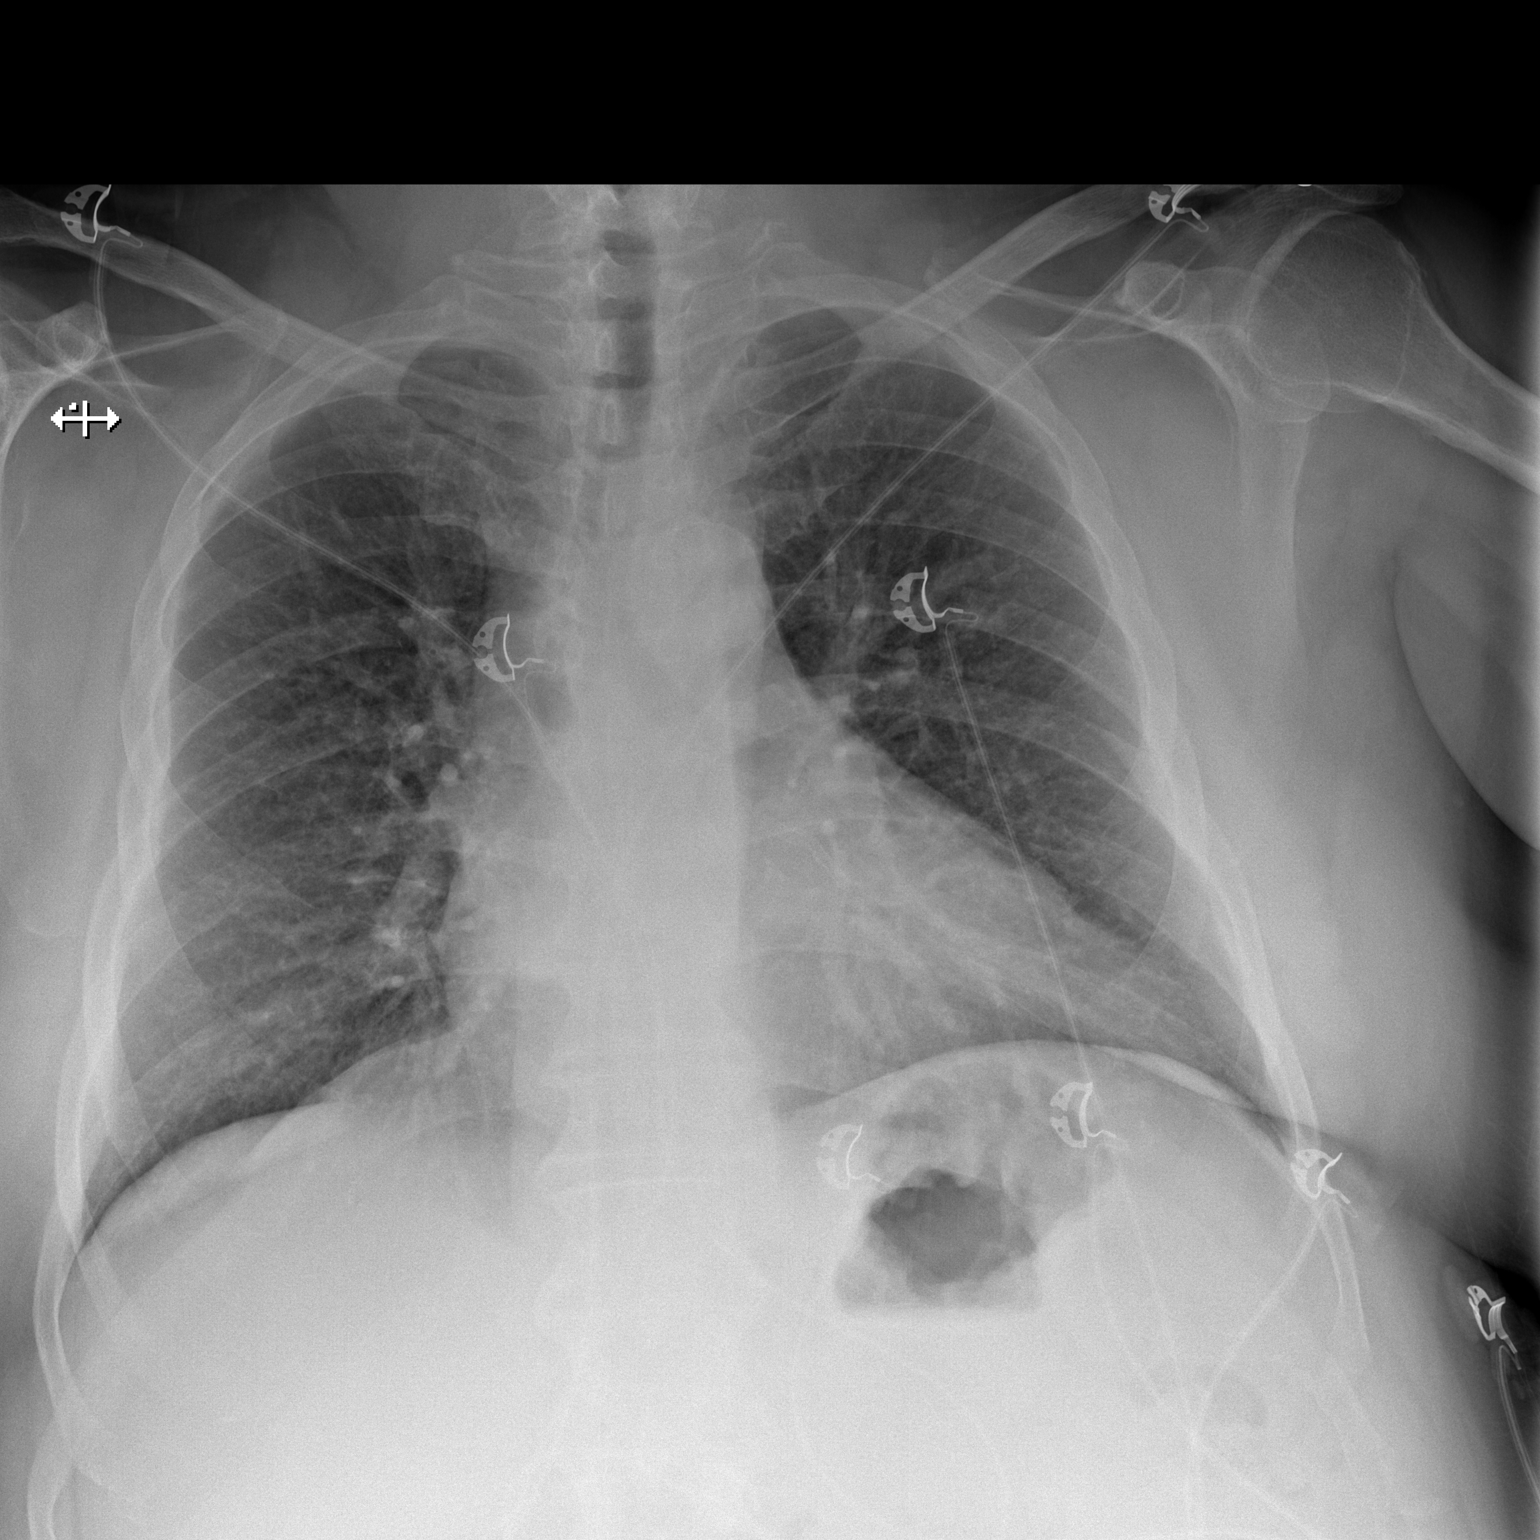

[w chest lat]
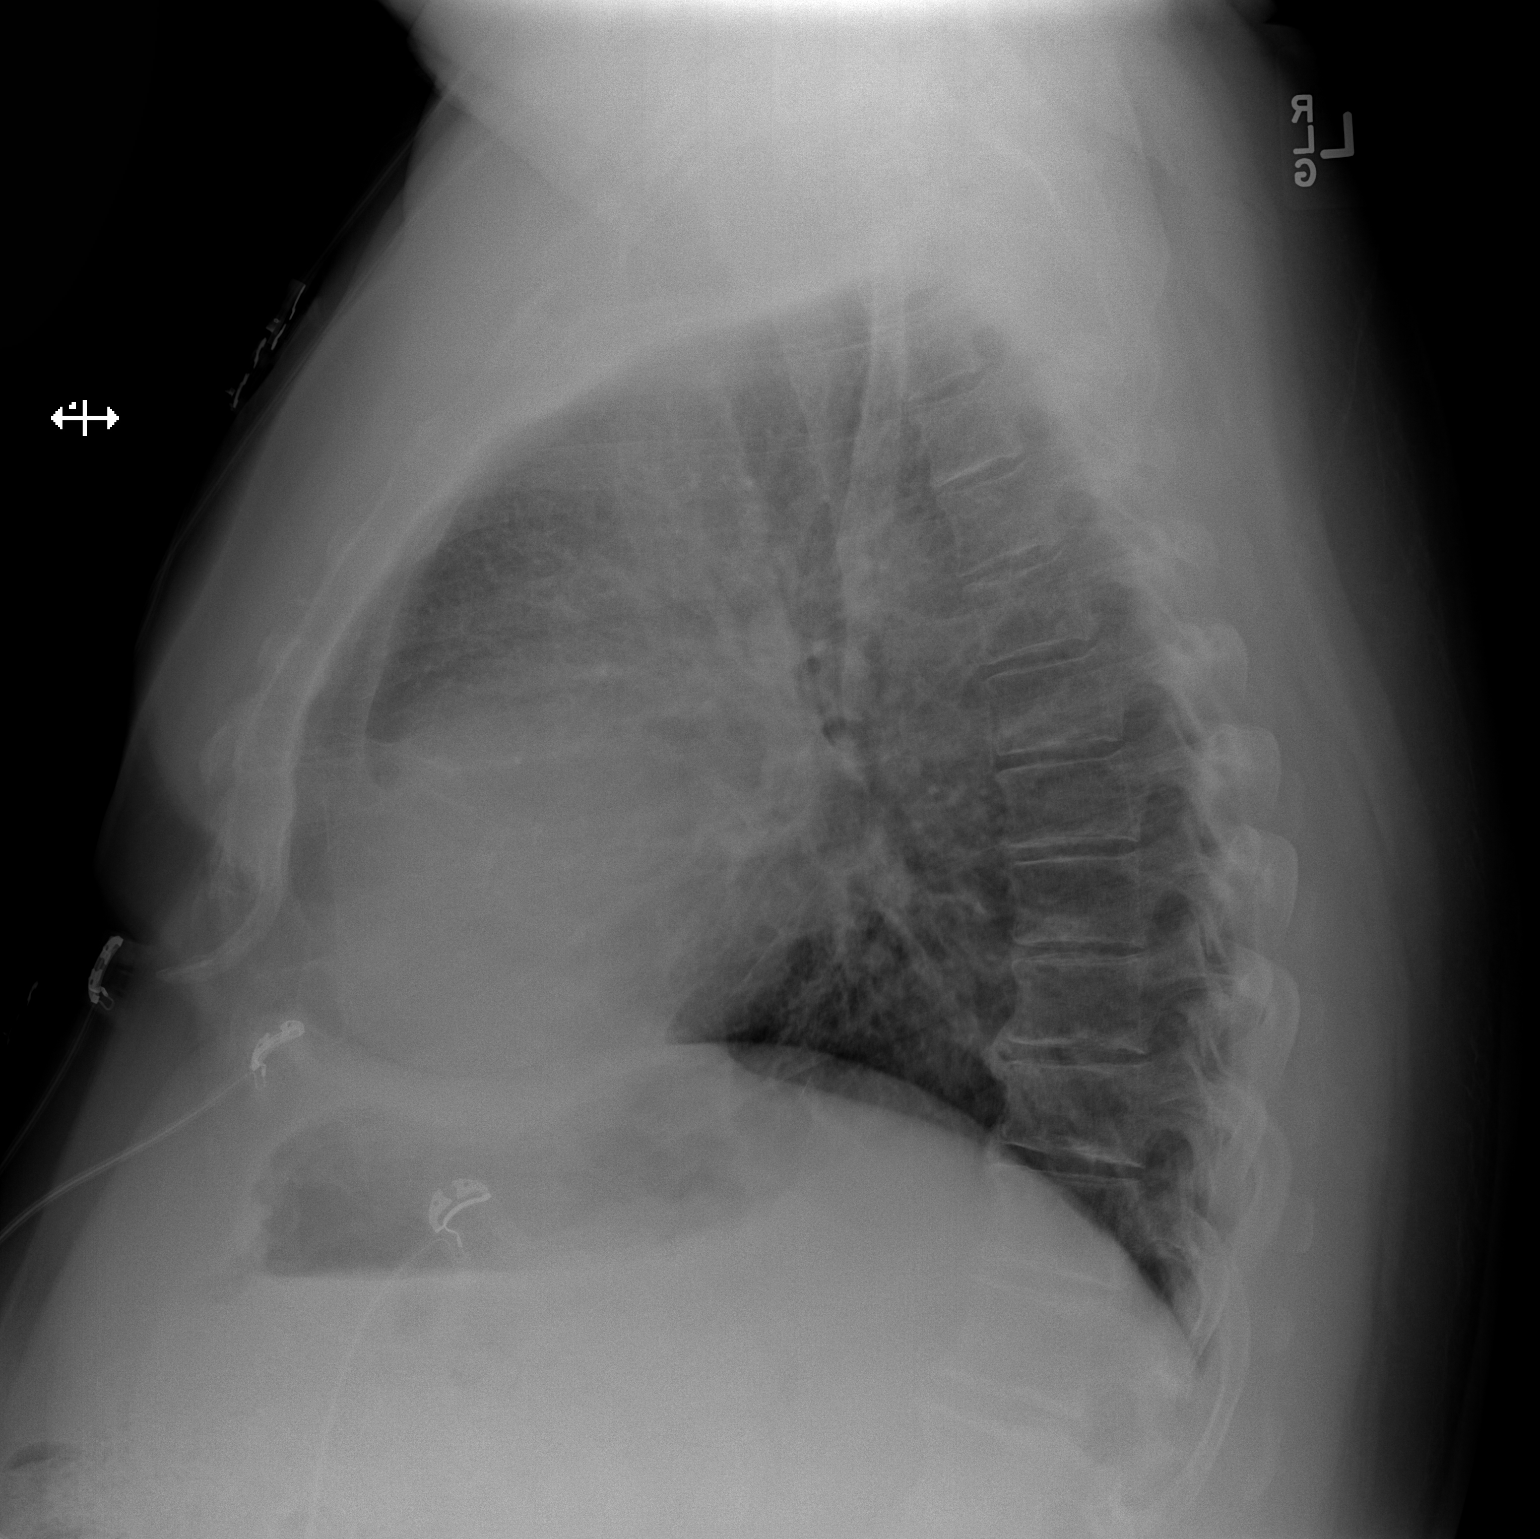

[2 of 2 positions shown; findings below may reference images not displayed]

FINDINGS: Borderline cardiac enlargement. Mild vascular congestion. No active
infiltrates or failure. No effusion or pneumothorax. Bones
unremarkable. Stable appearance from priors.
IMPRESSION: Borderline cardiomegaly. Mild vascular congestion. No active
infiltrates or failure.

## 2018-09-24 ENCOUNTER — Ambulatory Visit: Payer: Medicaid Other | Admitting: Podiatry

## 2018-10-02 ENCOUNTER — Ambulatory Visit: Payer: Medicaid Other | Admitting: Podiatry

## 2018-10-02 ENCOUNTER — Encounter: Payer: Self-pay | Admitting: Podiatry

## 2018-10-02 DIAGNOSIS — M79675 Pain in left toe(s): Secondary | ICD-10-CM | POA: Diagnosis not present

## 2018-10-02 DIAGNOSIS — B351 Tinea unguium: Secondary | ICD-10-CM | POA: Diagnosis not present

## 2018-10-02 DIAGNOSIS — M79674 Pain in right toe(s): Secondary | ICD-10-CM

## 2018-10-02 NOTE — Patient Instructions (Signed)
Diabetes Mellitus and Foot Care Foot care is an important part of your health, especially when you have diabetes. Diabetes may cause you to have problems because of poor blood flow (circulation) to your feet and legs, which can cause your skin to:  Become thinner and drier.  Break more easily.  Heal more slowly.  Peel and crack. You may also have nerve damage (neuropathy) in your legs and feet, causing decreased feeling in them. This means that you may not notice minor injuries to your feet that could lead to more serious problems. Noticing and addressing any potential problems early is the best way to prevent future foot problems. How to care for your feet Foot hygiene  Wash your feet daily with warm water and mild soap. Do not use hot water. Then, pat your feet and the areas between your toes until they are completely dry. Do not soak your feet as this can dry your skin.  Trim your toenails straight across. Do not dig under them or around the cuticle. File the edges of your nails with an emery board or nail file.  Apply a moisturizing lotion or petroleum jelly to the skin on your feet and to dry, brittle toenails. Use lotion that does not contain alcohol and is unscented. Do not apply lotion between your toes. Shoes and socks  Wear clean socks or stockings every day. Make sure they are not too tight. Do not wear knee-high stockings since they may decrease blood flow to your legs.  Wear shoes that fit properly and have enough cushioning. Always look in your shoes before you put them on to be sure there are no objects inside.  To break in new shoes, wear them for just a few hours a day. This prevents injuries on your feet. Wounds, scrapes, corns, and calluses  Check your feet daily for blisters, cuts, bruises, sores, and redness. If you cannot see the bottom of your feet, use a mirror or ask someone for help.  Do not cut corns or calluses or try to remove them with medicine.  If you  find a minor scrape, cut, or break in the skin on your feet, keep it and the skin around it clean and dry. You may clean these areas with mild soap and water. Do not clean the area with peroxide, alcohol, or iodine.  If you have a wound, scrape, corn, or callus on your foot, look at it several times a day to make sure it is healing and not infected. Check for: ? Redness, swelling, or pain. ? Fluid or blood. ? Warmth. ? Pus or a bad smell. General instructions  Do not cross your legs. This may decrease blood flow to your feet.  Do not use heating pads or hot water bottles on your feet. They may burn your skin. If you have lost feeling in your feet or legs, you may not know this is happening until it is too late.  Protect your feet from hot and cold by wearing shoes, such as at the beach or on hot pavement.  Schedule a complete foot exam at least once a year (annually) or more often if you have foot problems. If you have foot problems, report any cuts, sores, or bruises to your health care provider immediately. Contact a health care provider if:  You have a medical condition that increases your risk of infection and you have any cuts, sores, or bruises on your feet.  You have an injury that is not   healing.  You have redness on your legs or feet.  You feel burning or tingling in your legs or feet.  You have pain or cramps in your legs and feet.  Your legs or feet are numb.  Your feet always feel cold.  You have pain around a toenail. Get help right away if:  You have a wound, scrape, corn, or callus on your foot and: ? You have pain, swelling, or redness that gets worse. ? You have fluid or blood coming from the wound, scrape, corn, or callus. ? Your wound, scrape, corn, or callus feels warm to the touch. ? You have pus or a bad smell coming from the wound, scrape, corn, or callus. ? You have a fever. ? You have a red line going up your leg. Summary  Check your feet every day  for cuts, sores, red spots, swelling, and blisters.  Moisturize feet and legs daily.  Wear shoes that fit properly and have enough cushioning.  If you have foot problems, report any cuts, sores, or bruises to your health care provider immediately.  Schedule a complete foot exam at least once a year (annually) or more often if you have foot problems. This information is not intended to replace advice given to you by your health care provider. Make sure you discuss any questions you have with your health care provider. Document Released: 08/26/2000 Document Revised: 10/11/2017 Document Reviewed: 09/30/2016 Elsevier Interactive Patient Education  2019 [employer]  Onychomycosis/Fungal Toenails  WHAT IS IT? An infection that lies within the keratin of your nail plate that is caused by a fungus.  WHY ME? Fungal infections affect all ages, sexes, races, and creeds.  There may be many factors that predispose you to a fungal infection such as age, coexisting medical conditions such as diabetes, or an autoimmune disease; stress, medications, fatigue, genetics, etc.  Bottom line: fungus thrives in a warm, moist environment and your shoes offer such a location.  IS IT CONTAGIOUS? Theoretically, yes.  You do not want to share shoes, nail clippers or files with someone who has fungal toenails.  Walking around barefoot in the same room or sleeping in the same bed is unlikely to transfer the organism.  It is important to realize, however, that fungus can spread easily from one nail to the next on the same foot.  HOW DO WE TREAT THIS?  There are several ways to treat this condition.  Treatment may depend on many factors such as age, medications, pregnancy, liver and kidney conditions, etc.  It is best to ask your doctor which options are available to you.  1. No treatment.   Unlike many other medical concerns, you can live with this condition.  However for many people this can be a painful condition and  may lead to ingrown toenails or a bacterial infection.  It is recommended that you keep the nails cut short to help reduce the amount of fungal nail. 2. Topical treatment.  These range from herbal remedies to prescription strength nail lacquers.  About 40-50% effective, topicals require twice daily application for approximately 9 to 12 months or until an entirely new nail has grown out.  The most effective topicals are medical grade medications available through physicians offices. 3. Oral antifungal medications.  With an 80-90% cure rate, the most common oral medication requires 3 to 4 months of therapy and stays in your system for a year as the new nail grows out.  Oral antifungal medications do require   blood work to make sure it is a safe drug for you.  A liver function panel will be performed prior to starting the medication and after the first month of treatment.  It is important to have the blood work performed to avoid any harmful side effects.  In general, this medication safe but blood work is required. 4. Laser Therapy.  This treatment is performed by applying a specialized laser to the affected nail plate.  This therapy is noninvasive, fast, and non-painful.  It is not covered by insurance and is therefore, out of pocket.  The results have been very good with a 80-95% cure rate.  [facility] is the only practice in the area to offer this therapy. 5. Permanent Nail Avulsion.  Removing the entire nail so that a new nail will not grow back. 

## 2018-10-04 ENCOUNTER — Ambulatory Visit: Payer: Medicaid Other | Admitting: Pharmacist

## 2018-10-04 ENCOUNTER — Encounter: Payer: Self-pay | Admitting: Pharmacist

## 2018-10-04 DIAGNOSIS — I10 Essential (primary) hypertension: Secondary | ICD-10-CM

## 2018-10-04 DIAGNOSIS — I2510 Atherosclerotic heart disease of native coronary artery without angina pectoris: Secondary | ICD-10-CM

## 2018-10-04 DIAGNOSIS — Z794 Long term (current) use of insulin: Secondary | ICD-10-CM | POA: Diagnosis not present

## 2018-10-04 DIAGNOSIS — E1159 Type 2 diabetes mellitus with other circulatory complications: Secondary | ICD-10-CM

## 2018-10-04 DIAGNOSIS — Z9861 Coronary angioplasty status: Secondary | ICD-10-CM | POA: Diagnosis not present

## 2018-10-04 DIAGNOSIS — J449 Chronic obstructive pulmonary disease, unspecified: Secondary | ICD-10-CM

## 2018-10-04 MED ORDER — INSULIN GLARGINE 100 UNIT/ML ~~LOC~~ SOLN: 50.0000 [IU] | Freq: Two times a day (BID) | SUBCUTANEOUS | 5 refills | Status: DC

## 2018-10-04 MED ORDER — ALBUTEROL SULFATE HFA 108 (90 BASE) MCG/ACT IN AERS: INHALATION_SPRAY | RESPIRATORY_TRACT | 2 refills | Status: DC

## 2018-10-04 MED ORDER — CARVEDILOL 6.25 MG PO TABS: 6.2500 mg | ORAL_TABLET | Freq: Two times a day (BID) | ORAL | 1 refills | Status: DC

## 2018-10-04 NOTE — Progress Notes (Signed)
Patient ID: [name] [name], male   DOB: 10/17/1960, 58 y.o.   MRN: [id number] Reviewed: Agree with Dr. Graylin Shiver documentation and management.

## 2018-10-04 NOTE — Progress Notes (Signed)
S:     Chief Complaint  Patient presents with  . Medication Management    Diabetes    Patient arrives in good spirits and ambulates without assistance.  Presents for diabetes evaluation, education, and management at the request of Dr. Kennon Rounds . Patient was referred on 08/14/2018.  Patient was last seen by Primary Care Provider on 08/15/2019.   Sugar-free mints to help with smoking cessation.  Patient reports Diabetes was diagnosed in 2013.   Patient reports chest pain 2 weeks ago and SOB; radiates to his shoulder sometimes; using 1 NTG every 2 weeks.  Insurance coverage/medication affordability: [place] Medicaid  Patient denies adherence with medications.  Current diabetes medications include: Lantus 45-50 units BID, Novolog 50 units BID, Victoza 1.8 mg once daily, Jardiance 25 mg daily - Typically taking Lantus 50 units BID; sometimes skips morning dose if sugar "too low"; Current hypertension medications include: lisinopril 2.5 mg BID, carvedilol 3.125 mg BID  Patient denies hypoglycemic events. Did report readings >300 around the holidays and due to skipped doses of insulin.  Patient reported dietary habits: Eats 3 meals/day Drinks: Patient reports drinking 2 cans of diet soda daily. Pt inquired whether dark colored soda were harmful to kidneys. Encouraged patient to decrease soda intake to 1 can and to choose light-colored soda over dark.    Patient reports improvements in nocturia.  Patient denies neuropathy - saw podiatrist 2 days ago; no concerns  Patient reports visual changes when sugars >300s.  Patient reports self foot exams.    O:  Physical Exam Vitals signs reviewed.  Constitutional:      Appearance: Normal appearance. He is obese.  Neurological:     Mental Status: He is alert.  Psychiatric:        Mood and Affect: Mood normal.        Behavior: Behavior normal.      Review of Systems  All other systems reviewed and are negative.    Lab Results    Component Value Date   HGBA1C 7.9 (A) 08/14/2018   Vitals:   10/04/18 1412  BP: 114/62  Pulse: 76  SpO2: 95%    Lipid Panel     Component Value Date/Time   CHOL 118 04/10/2018 1507   TRIG 150 (H) 04/10/2018 1507   HDL 35 (L) 04/10/2018 1507   CHOLHDL 3.5 04/03/2017 1451   CHOLHDL 3.7 03/29/2016 1227   VLDL 53 (H) 03/29/2016 1227   LDLCALC 53 04/10/2018 1507    Home fasting CBG: 132 this morning; 150-180s  Clinical ASCVD: Yes      A/P: Diabetes longstanding, fasting blood sugar currently uncontrolled. Patient is able to verbalize appropriate hypoglycemia management plan. Patient is not adherent with medication. Patient reports skipping doses of Lantus (insulin glargine) when morning blood sugar is around 130 due to fear of hypoglycemia. Control is suboptimal due to nonadherence and progression of diabetes. -Adjusted dose of basal insulin Lantus (insulin glargine) to 50 units twice daily from 45 units twice daily.   -Counseled patient on the effect of long-acting insulin and encouraged him to take his morning dose of Lantus -Continued  rapid insulin Novolog (insulin aspart) 50 units BID.  -Continued GLP-1 Victoza (liraglutide) 1.8 mg daily.  -Continued SGLT2-I Jardiance (empagliflozin) 25mg .  -Extensively discussed pathophysiology of DM, recommended lifestyle interventions, dietary effects on glycemic control -Counseled on s/sx of and management of hypoglycemia -Next A1C anticipated 11/2018.   ASCVD risk - secondary prevention in patient with DM. Last LDL is controlled (  03/2018). -Continued aspirin 81 mg  -Continued atorvastatin 80 mg.   History of CAD with Angina using NTG SL Q2 weeks with symptom relief (1 tab only). Currently on Carvedilol 3.125mg  BID. Increase to 6.25mg  BID (use 2 pills Twice daily) and counseled on hypotension. Asked to call our office if experienced any orthostatic symptoms.  Follow up with Cardiology appointment  Hypertension longstanding  currently controlled.  BP goal = 130/80 mmHg. Patient reports adherence with medication. -Continue lisinopril 2.5 BID, reevaluate BP and orthostatic BP after Carvedilol dose increase.   Written patient instructions provided.  Total time in face to face counseling 30 minutes.   Follow up PCP Clinic Visit in March. Follow up Pharmacist Clinic Visit in April.   Patient seen with Emeline General, PharmD Candidate and Catie Darnelle Maffucci, PharmD,  PGY2 Pharmacy Resident.

## 2018-10-04 NOTE — Patient Instructions (Addendum)
It was great to see you today!    We are going to make two changes:  1) Increase Lantus to 50 units twice daily. Make sure you take this insulin twice daily every day! This will not drop your blood sugars, so you can take it, even if your morning sugar is really good.  2) Increase carvedilol to 6.25 mg twice daily - 2 tablets of what you have in the morning and 2 in the evening. When you finish this, we sent the prescription for the higher dose to the pharmacy. Let us or let the heart doctor know if you start being really lightheaded or dizzy.   Try to cut down the number of mints and drops that you use every day. This will probably help your blood sugar! Make sure you get the sugar-free mints.   Schedule follow up with [name] [name] in March as planned. Schedule an appointment with [place] in April.

## 2018-10-04 NOTE — Assessment & Plan Note (Signed)
Diabetes longstanding, fasting blood sugar currently uncontrolled. Patient is able to verbalize appropriate hypoglycemia management plan. Patient is not adherent with medication. Patient reports skipping doses of Lantus (insulin glargine) when morning blood sugar is around 130 due to fear of hypoglycemia. Control is suboptimal due to nonadherence and progression of diabetes. -Adjusted dose of basal insulin Lantus (insulin glargine) to 50 units twice daily from 45 units twice daily.   -Counseled patient on the effect of long-acting insulin and encouraged him to take his morning dose of Lantus -Continued  rapid insulin Novolog (insulin aspart) 50 units BID.  -Continued GLP-1 Victoza (liraglutide) 1.8 mg daily.  -Continued SGLT2-I Jardiance (empagliflozin) 25mg .  -Extensively discussed pathophysiology of DM, recommended lifestyle interventions, dietary effects on glycemic control -Counseled on s/sx of and management of hypoglycemia -Next A1C anticipated 11/2018.  Marland Kitchen

## 2018-10-04 NOTE — Assessment & Plan Note (Signed)
History of CAD with Angina using NTG SL Q2 weeks with symptom relief (1 tab only). Currently on Carvedilol 3.125mg  BID. Increase to 6.25mg  BID (use 2 pills Twice daily) and counseled on hypotension. Asked to call our office if experienced any orthostatic symptoms.  Follow up with Cardiology appointment  Hypertension longstanding currently controlled.  BP goal = 130/80 mmHg. Patient reports adherence with medication. -Continue lisinopril 2.5 BID, reevaluate BP and orthostatic BP after Carvedilol dose increase.

## 2018-10-07 ENCOUNTER — Other Ambulatory Visit: Payer: Self-pay | Admitting: Family Medicine

## 2018-10-07 DIAGNOSIS — Z9861 Coronary angioplasty status: Secondary | ICD-10-CM

## 2018-10-07 DIAGNOSIS — Z720 Tobacco use: Secondary | ICD-10-CM

## 2018-10-07 DIAGNOSIS — I10 Essential (primary) hypertension: Secondary | ICD-10-CM

## 2018-10-07 DIAGNOSIS — I2510 Atherosclerotic heart disease of native coronary artery without angina pectoris: Secondary | ICD-10-CM

## 2018-10-07 NOTE — Progress Notes (Signed)
10/12/2018 [name] [name]   1960/09/27  [id number]  Primary Physician [name] Bores, MD Primary Cardiologist: [name]  HPI:  [name] [name] is seen today for follow up CAD. His past medical history is significant for CAD, DM, obesity, tobacco use , hyperlipidemia and hypertension. He is status post stenting of the right coronary artery in May of 2013. He was admitted with a NSTEMI inFebruary of 2015. He was found to have severe disease in LCx and OM2 treated with BMS.  He was placed on dual antiplatelet therapy with aspirin plus Effient.   He presented with increased angina in October 2015. A Myoview study was intermediate risk.  He had repeat cardiac cath in November 2015.  He was found to have severe single-vessel disease involving the bifurcation of OM1 and OM 2 with 99% in-stent restenosis in the bare-metal stent placed in the OM 2. He underwent successful PCI spanning the proximal circumflex stent across OM 1 through the bare-metal stent placed in OM 2 with a Xience alpine DES stent. He was also noted to have a widely patent stent in the RCA and proximal circumflex with otherwise mild-moderate disease. He has preserved LVEF with an ejection fraction of 55-65%. He was instructed to continue with DAPT with ASA + Effient.   On follow up today he reports he is doing  well. He denies any chest pain or SOB. He quit smoking 7 months ago. He is on Wellbutrin. He is seeing a "COPD" doctor now.   Last A1c improved from 8.3>>7.9%. .    Current Outpatient Medications  Medication Sig Dispense Refill  . ACCU-CHEK FASTCLIX LANCETS MISC 1 Units by Percutaneous route 4 (four) times daily. 100 each 12  . albuterol (PROVENTIL HFA) 108 (90 Base) MCG/ACT inhaler INHALE 2 PUFFS INTO THE LUNGS EVERY 6 HOURS AS NEEDED FOR WHEEZING OR FOR SHORTNESS OF BREATH 6.7 each 2  . aspirin 81 MG chewable tablet Chew 1 tablet (81 mg total) by mouth daily. 180 tablet 2  . atorvastatin (LIPITOR) 80 MG tablet TAKE ONE TABLET BY  MOUTH DAILY 90 tablet 3  . BD ULTRA-FINE PEN NEEDLES 29G X 12.7MM MISC USE TO WITH VICTOZA PEN, INJECTION ONCE DAILY. 100 each 10  . Blood Glucose Monitoring Suppl (ACCU-CHEK AVIVA PLUS) w/Device KIT CHECK BLOOD GLUCOSE FASTING AND 2 HOURS AFTER BREAKFAST, LUNCH AND DINNER 1 kit 0  . buPROPion (WELLBUTRIN XL) 150 MG 24 hr tablet TAKE ONE TABLET BY MOUTH DAILY 30 tablet 0  . carvedilol (COREG) 3.125 MG tablet TAKE ONE TABLET BY MOUTH TWICE A DAY WITH MEALS 60 tablet 0  . carvedilol (COREG) 6.25 MG tablet Take 1 tablet (6.25 mg total) by mouth 2 (two) times daily with a meal. 60 tablet 1  . citalopram (CELEXA) 20 MG tablet Take 1 tablet (20 mg total) by mouth daily. (Patient taking differently: Take 20 mg by mouth every evening. ) 30 tablet 12  . fluticasone furoate-vilanterol (BREO ELLIPTA) 100-25 MCG/INH AEPB Inhale 1 puff into the lungs daily. 2 each 0  . glucose blood (ACCU-CHEK AVIVA PLUS) test strip USE ONE STRIP TO TEST FOUR TIMES A DAY 300 each 11  . hydrOXYzine (VISTARIL) 25 MG capsule Take 1 capsule (25 mg total) by mouth 3 (three) times daily as needed. 30 capsule 0  . insulin aspart (NOVOLOG) 100 UNIT/ML injection Inject 1 Units into the skin 3 (three) times daily before meals. 10 mL 11  . insulin glargine (LANTUS) 100 UNIT/ML injection Inject 0.5 mLs (50  Units total) into the skin 2 (two) times daily. 4 vial 5  . Insulin Syringe-Needle U-100 (B-D INS SYR ULTRAFINE 1CC/31G) 31G X 5/16" 1 ML MISC Use up to 5 times daily with Novolog and Lantus dosing 100 each 3  . JARDIANCE 25 MG TABS tablet TAKE ONE TABLET BY MOUTH DAILY 30 tablet 2  . liraglutide (VICTOZA) 18 MG/3ML SOPN INJECT 0.3 MLS (1.8 MG TOTAL) INTO THE SKIN DAILY 9 pen 10  . lisinopril (PRINIVIL,ZESTRIL) 2.5 MG tablet TAKE ONE TABLET BY MOUTH TWICE A DAY 180 tablet 2  . metFORMIN (GLUCOPHAGE) 1000 MG tablet TAKE ONE TABLET BY MOUTH TWICE A DAY WITH A MEAL 180 tablet 10  . mometasone-formoterol (DULERA) 200-5 MCG/ACT AERO Inhale 2  puffs into the lungs 2 (two) times daily. 1 Inhaler 11  . Multiple Vitamin (MULTIVITAMIN) tablet Take 1 tablet by mouth daily.    . nitroGLYCERIN (NITROSTAT) 0.4 MG SL tablet PLACE 1 TABLET UNDER THE TONGUE EVERY 5 MINUTES FOR 3 DOSES AS NEEDED FOR CHEST PAIN *IF NO RELIEF, CALL 911* 100 tablet 2  . NOVOLOG 100 UNIT/ML injection INJECT 50 UNITS INTO THE SKIN TWO TIMES A DAY BEFORE MEALS 30 vial 3  . Omega-3 Fatty Acids (FISH OIL PO) Take 1,000 mg by mouth 2 (two) times daily.     . prasugrel (EFFIENT) 10 MG TABS tablet Take 1 tablet (10 mg total) by mouth daily. 90 tablet 3  . traMADol (ULTRAM) 50 MG tablet Take 1 tablet (50 mg total) by mouth every 6 (six) hours as needed. 20 tablet 0  . varenicline (CHANTIX CONTINUING MONTH PAK) 1 MG tablet Take 1 tablet (1 mg total) by mouth 2 (two) times daily. 180 tablet 0   No current facility-administered medications for this visit.     Allergies  Allergen Reactions  . Shellfish Allergy Nausea And Vomiting    OYSTERS    Social History   Socioeconomic History  . Marital status: Single    Spouse name: Not on file  . Number of children: 0  . Years of education: 82  . Highest education level: Not on file  Occupational History  . Occupation: Unemployed    Comment: Used to work for [employer]  . Occupation: Consulting civil engineer, Insurance underwriter [employer]  Social Needs  . Financial resource strain: Not on file  . Food insecurity:    Worry: Not on file    Inability: Not on file  . Transportation needs:    Medical: Not on file    Non-medical: Not on file  Tobacco Use  . Smoking status: Former Smoker    Packs/day: 1.00    Years: 42.00    Pack years: 42.00    Types: Cigarettes    Start date: 09/13/1975    Last attempt to quit: 01/25/2018    Years since quitting: 0.7  . Smokeless tobacco: Never Used  . Tobacco comment: Bupropion helping  Substance and Sexual Activity  . Alcohol use: Yes    Alcohol/week: 6.0 standard drinks    Types: 6 Cans of beer  per week    Comment: pt reports "I quit 66 days ago" (05/09/16)  . Drug use: No    Comment: Crack Cocaine (denies 05/09/16)  . Sexual activity: Not Currently  Lifestyle  . Physical activity:    Days per week: Not on file    Minutes per session: Not on file  . Stress: Not on file  Relationships  . Social connections:    Talks on  phone: Not on file    Gets together: Not on file    Attends religious service: Not on file    Active member of club or organization: Not on file    Attends meetings of clubs or organizations: Not on file    Relationship status: Not on file  . Intimate partner violence:    Fear of current or ex partner: Not on file    Emotionally abused: Not on file    Physically abused: Not on file    Forced sexual activity: Not on file  Other Topics Concern  . Not on file  Social History Narrative   Single.  Lives with a roommate.  Ambulates independently.     Review of Systems: AS noted in HPI All other systems reviewed and are otherwise negative except as noted above.  Blood pressure 122/78, pulse 66, height 5\' 7"  (1.702 m), weight 291 lb 9.6 oz (132.3 kg).  GENERAL:  Well appearing obese WM in NAD HEENT:  PERRL, EOMI, sclera are clear. Oropharynx is clear. NECK:  No jugular venous distention, carotid upstroke brisk and symmetric, no bruits, no thyromegaly or adenopathy LUNGS:  Clear to auscultation bilaterally CHEST:  Unremarkable HEART:  RRR,  PMI not displaced or sustained,S1 and S2 within normal limits, no S3, no S4: no clicks, no rubs, no murmurs ABD:  Soft, nontender. BS +, no masses or bruits. No hepatomegaly, no splenomegaly EXT:  2 + pulses throughout, no edema, no cyanosis no clubbing SKIN:  Warm and dry.  No rashes NEURO:  Alert and oriented x 3. Cranial nerves II through XII intact. PSYCH:  Cognitively intact      Laboratory data:  Lab Results  Component Value Date   WBC 10.7 (H) 11/07/2016   HGB 12.3 (L) 11/07/2016   HCT 36.8 (L) 11/07/2016    PLT 243 11/07/2016   GLUCOSE 175 (H) 04/10/2018   CHOL 118 04/10/2018   TRIG 150 (H) 04/10/2018   HDL 35 (L) 04/10/2018   LDLCALC 53 04/10/2018   ALT 27 04/10/2018   AST 18 04/10/2018   NA 142 04/10/2018   K 4.7 04/10/2018   CL 101 04/10/2018   CREATININE 0.85 04/10/2018   BUN 15 04/10/2018   CO2 25 04/10/2018   TSH 1.100 01/18/2012   INR 0.96 07/28/2014   HGBA1C 7.9 (A) 08/14/2018   MICROALBUR 4.7 05/23/2016   Ecg today shows NSR with normal Ecg. Rate 66. I have personally reviewed and interpreted this study.   ASSESSMENT AND PLAN:   1. CAD: Status post repeat PCI to the proximal circumflex and OM 2 in Nov. 2015 as outlined above with DES for instent restenosis. He is asymptomatic.  Continue dual antiplatelet therapy with aspirin plus Effient indefinitely due to extensive nature of stents.  Continue beta blocker, statin and ACE inhibitor for secondary prevention.  2. Hypertension: Blood pressure is well-controlled. Continue lisinopril and Coreg  3. Hyperlipidemia: on statin. Lipids under good control.  4. Diabetes: per primary care. Encourage weight loss.  5. Tobacco abuse: Congratulated him on smoking cessation.   I will follow up in 6 months   Jakyia Gaccione [place] MD,FACC  10/12/2018 2:08 PM

## 2018-10-12 ENCOUNTER — Encounter: Payer: Self-pay | Admitting: Cardiology

## 2018-10-12 ENCOUNTER — Ambulatory Visit: Payer: Medicaid Other | Admitting: Cardiology

## 2018-10-12 VITALS — BP 122/78 | HR 66 | Ht 67.0 in | Wt 291.6 lb

## 2018-10-12 DIAGNOSIS — I2510 Atherosclerotic heart disease of native coronary artery without angina pectoris: Secondary | ICD-10-CM | POA: Diagnosis not present

## 2018-10-12 DIAGNOSIS — E785 Hyperlipidemia, unspecified: Secondary | ICD-10-CM | POA: Diagnosis not present

## 2018-10-12 DIAGNOSIS — G4733 Obstructive sleep apnea (adult) (pediatric): Secondary | ICD-10-CM

## 2018-10-12 DIAGNOSIS — Z9861 Coronary angioplasty status: Secondary | ICD-10-CM | POA: Diagnosis not present

## 2018-10-12 DIAGNOSIS — I10 Essential (primary) hypertension: Secondary | ICD-10-CM

## 2018-10-12 DIAGNOSIS — Z9989 Dependence on other enabling machines and devices: Secondary | ICD-10-CM

## 2018-10-15 ENCOUNTER — Encounter: Payer: Self-pay | Admitting: Podiatry

## 2018-10-15 NOTE — Progress Notes (Signed)
Subjective: [name] [name] presents today with painful, thick toenails 1-5 b/l that he cannot cut and which interfere with daily activities.  Pain is aggravated when wearing enclosed shoe gear.  [name], MD is his PCP.   Current Outpatient Medications:  .  ACCU-CHEK FASTCLIX LANCETS MISC, 1 Units by Percutaneous route 4 (four) times daily., Disp: 100 each, Rfl: 12 .  aspirin 81 MG chewable tablet, Chew 1 tablet (81 mg total) by mouth daily., Disp: 180 tablet, Rfl: 2 .  atorvastatin (LIPITOR) 80 MG tablet, TAKE ONE TABLET BY MOUTH DAILY, Disp: 90 tablet, Rfl: 3 .  BD ULTRA-FINE PEN NEEDLES 29G X 12.7MM MISC, USE TO WITH VICTOZA PEN, INJECTION ONCE DAILY., Disp: 100 each, Rfl: 10 .  Blood Glucose Monitoring Suppl (ACCU-CHEK AVIVA PLUS) w/Device KIT, CHECK BLOOD GLUCOSE FASTING AND 2 HOURS AFTER BREAKFAST, LUNCH AND DINNER, Disp: 1 kit, Rfl: 0 .  citalopram (CELEXA) 20 MG tablet, Take 1 tablet (20 mg total) by mouth daily. (Patient taking differently: Take 20 mg by mouth every evening. ), Disp: 30 tablet, Rfl: 12 .  fluticasone furoate-vilanterol (BREO ELLIPTA) 100-25 MCG/INH AEPB, Inhale 1 puff into the lungs daily., Disp: 2 each, Rfl: 0 .  glucose blood (ACCU-CHEK AVIVA PLUS) test strip, USE ONE STRIP TO TEST FOUR TIMES A DAY, Disp: 300 each, Rfl: 11 .  hydrOXYzine (VISTARIL) 25 MG capsule, Take 1 capsule (25 mg total) by mouth 3 (three) times daily as needed., Disp: 30 capsule, Rfl: 0 .  insulin aspart (NOVOLOG) 100 UNIT/ML injection, Inject 1 Units into the skin 3 (three) times daily before meals., Disp: 10 mL, Rfl: 11 .  Insulin Syringe-Needle U-100 (B-D INS SYR ULTRAFINE 1CC/31G) 31G X 5/16" 1 ML MISC, Use up to 5 times daily with Novolog and Lantus dosing, Disp: 100 each, Rfl: 3 .  JARDIANCE 25 MG TABS tablet, TAKE ONE TABLET BY MOUTH DAILY, Disp: 30 tablet, Rfl: 2 .  liraglutide (VICTOZA) 18 MG/3ML SOPN, INJECT 0.3 MLS (1.8 MG TOTAL) INTO THE SKIN DAILY, Disp: 9 pen, Rfl: 10 .   lisinopril (PRINIVIL,ZESTRIL) 2.5 MG tablet, TAKE ONE TABLET BY MOUTH TWICE A DAY, Disp: 180 tablet, Rfl: 2 .  metFORMIN (GLUCOPHAGE) 1000 MG tablet, TAKE ONE TABLET BY MOUTH TWICE A DAY WITH A MEAL, Disp: 180 tablet, Rfl: 10 .  mometasone-formoterol (DULERA) 200-5 MCG/ACT AERO, Inhale 2 puffs into the lungs 2 (two) times daily., Disp: 1 Inhaler, Rfl: 11 .  Multiple Vitamin (MULTIVITAMIN) tablet, Take 1 tablet by mouth daily., Disp: , Rfl:  .  nitroGLYCERIN (NITROSTAT) 0.4 MG SL tablet, PLACE 1 TABLET UNDER THE TONGUE EVERY 5 MINUTES FOR 3 DOSES AS NEEDED FOR CHEST PAIN *IF NO RELIEF, CALL 911*, Disp: 100 tablet, Rfl: 2 .  NOVOLOG 100 UNIT/ML injection, INJECT 50 UNITS INTO THE SKIN TWO TIMES A DAY BEFORE MEALS, Disp: 30 vial, Rfl: 3 .  Omega-3 Fatty Acids (FISH OIL PO), Take 1,000 mg by mouth 2 (two) times daily. , Disp: , Rfl:  .  prasugrel (EFFIENT) 10 MG TABS tablet, Take 1 tablet (10 mg total) by mouth daily., Disp: 90 tablet, Rfl: 3 .  traMADol (ULTRAM) 50 MG tablet, Take 1 tablet (50 mg total) by mouth every 6 (six) hours as needed., Disp: 20 tablet, Rfl: 0 .  varenicline (CHANTIX CONTINUING MONTH PAK) 1 MG tablet, Take 1 tablet (1 mg total) by mouth 2 (two) times daily., Disp: 180 tablet, Rfl: 0 .  albuterol (PROVENTIL HFA) 108 (90 Base) MCG/ACT inhaler,  INHALE 2 PUFFS INTO THE LUNGS EVERY 6 HOURS AS NEEDED FOR WHEEZING OR FOR SHORTNESS OF BREATH, Disp: 6.7 each, Rfl: 2 .  buPROPion (WELLBUTRIN XL) 150 MG 24 hr tablet, TAKE ONE TABLET BY MOUTH DAILY, Disp: 30 tablet, Rfl: 0 .  carvedilol (COREG) 3.125 MG tablet, TAKE ONE TABLET BY MOUTH TWICE A DAY WITH MEALS, Disp: 60 tablet, Rfl: 0 .  carvedilol (COREG) 6.25 MG tablet, Take 1 tablet (6.25 mg total) by mouth 2 (two) times daily with a meal., Disp: 60 tablet, Rfl: 1 .  insulin glargine (LANTUS) 100 UNIT/ML injection, Inject 0.5 mLs (50 Units total) into the skin 2 (two) times daily., Disp: 4 vial, Rfl: 5  Allergies  Allergen Reactions  .  Shellfish Allergy Nausea And Vomiting    OYSTERS    Objective:  Vascular Examination: Capillary refill time immediate x 10 digits  Dorsalis pedis and Posterior tibial pulses are both 2/4 b/l  Digital hair present x 10 digits  Skin temperature gradient WNL b/l  Dermatological Examination: Skin with normal turgor, texture and tone b/l  Toenails 1-5 b/l discolored, thick, dystrophic with subungual debris and pain with palpation to nailbeds due to thickness of nails.  Musculoskeletal: Muscle strength 5/5 to all LE muscle groups  No gross bony deformities b/l.  No pain, crepitus or joint limitation noted with ROM.   Neurological: Sensation intact with 10 gram monofilament. Vibratory sensation intact.  Assessment: Painful onychomycosis toenails 1-5 b/l  NIDDM  Plan: 1. Revisited diabetic foot care principles.  Literature dispensed on today. 2. Toenails 1-5 b/l were debrided in length and girth without iatrogenic bleeding. 3. Patient to continue soft, supportive shoe gear 4. Patient to report any pedal injuries to medical professional immediately. 5. Follow up 3 months.  6. Patient/POA to call should there be a concern in the interim.

## 2018-11-08 ENCOUNTER — Other Ambulatory Visit: Payer: Self-pay | Admitting: Family Medicine

## 2018-11-08 DIAGNOSIS — Z720 Tobacco use: Secondary | ICD-10-CM

## 2018-11-12 ENCOUNTER — Other Ambulatory Visit: Payer: Self-pay | Admitting: Family Medicine

## 2018-11-12 DIAGNOSIS — Z794 Long term (current) use of insulin: Principal | ICD-10-CM

## 2018-11-12 DIAGNOSIS — E1159 Type 2 diabetes mellitus with other circulatory complications: Secondary | ICD-10-CM

## 2018-11-23 ENCOUNTER — Encounter: Payer: Self-pay | Admitting: Family Medicine

## 2018-11-26 ENCOUNTER — Other Ambulatory Visit: Payer: Self-pay

## 2018-11-26 ENCOUNTER — Encounter: Payer: Self-pay | Admitting: Family Medicine

## 2018-11-26 ENCOUNTER — Ambulatory Visit: Payer: Medicaid Other | Admitting: Family Medicine

## 2018-11-26 VITALS — BP 138/70 | Temp 98.3°F | Ht 67.0 in | Wt 282.0 lb

## 2018-11-26 DIAGNOSIS — E1159 Type 2 diabetes mellitus with other circulatory complications: Secondary | ICD-10-CM

## 2018-11-26 DIAGNOSIS — I10 Essential (primary) hypertension: Secondary | ICD-10-CM | POA: Diagnosis not present

## 2018-11-26 DIAGNOSIS — M25512 Pain in left shoulder: Secondary | ICD-10-CM | POA: Diagnosis not present

## 2018-11-26 DIAGNOSIS — G8929 Other chronic pain: Secondary | ICD-10-CM

## 2018-11-26 DIAGNOSIS — Z794 Long term (current) use of insulin: Secondary | ICD-10-CM

## 2018-11-26 DIAGNOSIS — I214 Non-ST elevation (NSTEMI) myocardial infarction: Secondary | ICD-10-CM | POA: Diagnosis not present

## 2018-11-26 LAB — POCT GLYCOSYLATED HEMOGLOBIN (HGB A1C): HbA1c, POC (controlled diabetic range): 7.4 % — AB (ref 0.0–7.0)

## 2018-11-26 MED ORDER — CELECOXIB 200 MG PO CAPS: 200.0000 mg | ORAL_CAPSULE | Freq: Two times a day (BID) | ORAL | 0 refills | Status: DC

## 2018-11-26 NOTE — Assessment & Plan Note (Signed)
Continue Corge, Lipitor, ASA, NTG, Effient.

## 2018-11-26 NOTE — Patient Instructions (Addendum)
Coronavirus (COVID-19) Are you at risk?  Are you at risk for the Coronavirus (COVID-19)?  To be considered HIGH RISK for Coronavirus (COVID-19), you have to meet the following criteria:  . Traveled to [place], [place], [place], [place] or [place]; or in the [place] to [place], [place], [place], or [place]; and have fever, cough, and shortness of breath within the last 2 weeks of travel OR . Been in close contact with a person diagnosed with COVID-19 within the last 2 weeks and have fever, cough, and shortness of breath . IF YOU DO NOT MEET THESE CRITERIA, YOU ARE CONSIDERED LOW RISK FOR COVID-19.  What to do if you are HIGH RISK for COVID-19?  . If you are having a medical emergency, call 911. . Seek medical care right away. Before you go to a doctor's office, urgent care or emergency department, call ahead and tell them about your recent travel, contact with someone diagnosed with COVID-19, and your symptoms. You should receive instructions from your physician's office regarding next steps of care.  . When you arrive at healthcare provider, tell the healthcare staff immediately you have returned from visiting [place], [place], [place], [place] or [place]; or traveled in the [place] to [place], [place], [place], or [place]; in the last two weeks or you have been in close contact with a person diagnosed with COVID-19 in the last 2 weeks.   . Tell the health care staff about your symptoms: fever, cough and shortness of breath. . After you have been seen by a medical provider, you will be either: o Tested for (COVID-19) and discharged home on quarantine except to seek medical care if symptoms worsen, and asked to  - Stay home and avoid contact with others until you get your results (4-5 days)  - Avoid travel on public transportation if possible (such as bus, train, or airplane) or o Sent to the Emergency Department by EMS for evaluation, COVID-19 testing, and possible  admission depending on your condition and test results.  What to do if you are LOW RISK for COVID-19?  Reduce your risk of any infection by using the same precautions used for avoiding the common cold or flu:  . Wash your hands often with soap and warm water for at least 20 seconds.  If soap and water are not readily available, use an alcohol-based hand sanitizer with at least 60% alcohol.  . If coughing or sneezing, cover your mouth and nose by coughing or sneezing into the elbow areas of your shirt or coat, into a tissue or into your sleeve (not your hands). . Avoid shaking hands with others and consider head nods or verbal greetings only. . Avoid touching your eyes, nose, or mouth with unwashed hands.  . Avoid close contact with people who are sick. . Avoid places or events with large numbers of people in one location, like concerts or sporting events. . Carefully consider travel plans you have or are making. . If you are planning any travel outside or inside the [place], visit the CDC's Travelers' Health webpage for the latest health notices. . If you have some symptoms but not all symptoms, continue to monitor at home and seek medical attention if your symptoms worsen. . If you are having a medical emergency, call 911.   ADDITIONAL HEALTHCARE OPTIONS FOR PATIENTS  [facility] / e-Visit: [email or web address]         [facility]: [phone]  [facility]: [phone]                   [facility]: [phone]   Shoulder Range of Motion Exercises Shoulder range of motion (ROM) exercises are done to keep the shoulder moving freely or to increase movement. They are often recommended for people who have shoulder pain or stiffness or who are recovering from a shoulder surgery. Phase 1 exercises When you are able, do this exercise 1-2 times per day for 30-60 seconds in each direction, or as directed by  your health care provider. Pendulum exercise To do this exercise while sitting: 1. Sit in a chair or at the edge of your bed with your feet flat on the floor. 2. Let your affected arm hang down in front of you over the edge of the bed or chair. 3. Relax your shoulder, arm, and hand. [place] your body so your arm gently swings in small circles. You can also use your unaffected arm to start the motion. 5. Repeat changing the direction of the circles, swinging your arm left and right, and swinging your arm forward and back. To do this exercise while standing: 1. Stand next to a sturdy chair or table, and hold on to it with your hand on your unaffected side. 2. Bend forward at the waist. 3. Bend your knees slightly. 4. Relax your shoulder, arm, and hand. 5. While keeping your shoulder relaxed, use body motion to swing your arm in small circles. 6. Repeat changing the direction of the circles, swinging your arm left and right, and swinging your arm forward and back. 7. Between exercises, stand up tall and take a short break to relax your lower back.  Phase 2 exercises Do these exercises 1-2 times per day or as told by your health care provider. Hold each stretch for 30 seconds, and repeat 3 times. Do the exercises with one or both arms as instructed by your health care provider. For these exercises, sit at a table with your hand and arm supported by the table. A chair that slides easily or has wheels can be helpful. External rotation 1. Turn your chair so that your affected side is nearest to the table. 2. Place your forearm on the table to your side. Bend your elbow about 90 at the elbow (right angle) and place your hand palm facing down on the table. Your elbow should be about 6 inches away from your side. 3. Keeping your arm on the table, lean your body forward. Abduction 1. Turn your chair so that your affected side is nearest to the table. 2. Place your forearm and hand on the table so  that your thumb points toward the ceiling and your arm is straight out to your side. 3. Slide your hand out to the side and away from you, using your unaffected arm to do the work. 4. To increase the stretch, you can slide your chair away from the table. Flexion: forward stretch 1. Sit facing the table. Place your hand and elbow on the table in front of you. 2. Slide your hand forward and away from you, using your unaffected arm to do the work. 3. To increase the stretch, you can slide your chair backward. Phase 3 exercises Do these exercises 1-2 times per day or as told by your health care provider. Hold each stretch for 30 seconds, and repeat 3 times. Do the exercises with one or both arms as instructed by your health care  provider. Cross-body stretch: posterior capsule stretch 1. Lift your arm straight out in front of you. 2. Bend your arm 90 at the elbow (right angle) so your forearm moves across your body. 3. Use your other arm to gently pull the elbow across your body, toward your other shoulder. Wall climbs 1. Stand with your affected arm extended out to the side with your hand resting on a door frame. 2. Slide your hand slowly up the door frame. 3. To increase the stretch, step through the door frame. Keep your body upright and do not lean. Wand exercises You will need a cane, a piece of PVC pipe, or a sturdy wooden dowel for wand exercises. Flexion To do this exercise while standing: 1. Hold the wand with both of your hands, palms down. 2. Using the other arm to help, lift your arms up and over your head, if able. 3. Push upward with your other arm to gently increase the stretch. To do this exercise while lying down: 1. Lie on your back with your elbows resting on the floor and the wand in both your hands. Your hands will be palm down, or pointing toward your feet. 2. Lift your hands toward the ceiling, using your unaffected arm to help if needed. 3. Bring your arms overhead as  able, using your unaffected arm to help if needed. Internal rotation 1. Stand while holding the wand behind you with both hands. Your unaffected arm should be extended above your head with the arm of the affected side extended behind you at the level of your waist. The wand should be pointing straight up and down as you hold it. 2. Slowly pull the wand up behind your back by straightening the elbow of your unaffected arm and bending the elbow of your affected arm. External rotation 1. Lie on your back with your affected upper arm supported on a small pillow or rolled towel. When you first do this exercise, keep your upper arm close to your body. Over time, bring your arm up to a 90 angle out to the side. 2. Hold the wand across your stomach and with both hands palm up. Your elbow on your affected side should be bent at a 90 angle. 3. Use your unaffected side to help push your forearm away from you and toward the floor. Keep your elbow on your affected side bent at a 90 angle. Contact a health care provider if you have:  New or increasing pain.  New numbness, tingling, weakness, or discoloration in your arm or hand. This information is not intended to replace advice given to you by your health care provider. Make sure you discuss any questions you have with your health care provider. Document Released: 05/28/2003 Document Revised: 10/11/2017 Document Reviewed: 10/11/2017 Elsevier Interactive Patient Education  2019 [employer].

## 2018-11-26 NOTE — Assessment & Plan Note (Addendum)
Trial of NSAIDS + exercises given

## 2018-11-26 NOTE — Assessment & Plan Note (Signed)
Continue coreg, Lisinopril

## 2018-11-26 NOTE — Assessment & Plan Note (Signed)
Continue metformin, victoza, insulin. A1C is good today

## 2018-11-26 NOTE — Progress Notes (Signed)
   Subjective:    Patient ID: [name] [name] is a 58 y.o. male presenting with Diabetes  on 11/26/2018  HPI: Has concerns about the corona virus. Has some pain in his left shoulder. Cannot lift it high enough. Has not done any exercises. Has taken Tramadol, which is not working.  Review of Systems  Constitutional: Negative for chills and fever.  Respiratory: Negative for shortness of breath.   Cardiovascular: Negative for leg swelling.  Gastrointestinal: Negative for abdominal pain, nausea and vomiting.      Objective:    BP 138/70   Temp 98.3 F (36.8 C) (Oral)   Ht 5\' 7"  (1.702 m)   Wt 282 lb (127.9 kg)   BMI 44.17 kg/m  Physical Exam Vitals signs reviewed.  Constitutional:      General: He is not in acute distress.    Appearance: He is well-developed.  HENT:     Head: Normocephalic and atraumatic.  Eyes:     General: No scleral icterus. Neck:     Musculoskeletal: Neck supple.  Cardiovascular:     Rate and Rhythm: Normal rate.  Pulmonary:     Effort: Pulmonary effort is normal.  Abdominal:     Palpations: Abdomen is soft.  Musculoskeletal:     Comments: Limited active, normal passive ROM left shoulder. Some pain with internal and external rotation.  Skin:    General: Skin is warm.  Neurological:     Mental Status: He is alert.         Assessment & Plan:   Problem List Items Addressed This Visit      High   H/O NSTEMI (non-ST elevated myocardial infarction) (Chronic)    Continue Corge, Lipitor, ASA, NTG, Effient.        Medium   Essential hypertension (Chronic)    Continue coreg, Lisinopril      DM type 2 (diabetes mellitus, type 2) ([place]) - Primary    Continue metformin, victoza, insulin. A1C is good today      Relevant Orders   HgB A1c (Completed)     Unprioritized   Chronic left shoulder pain    Trial of NSAIDS + exercises given      Relevant Medications   celecoxib (CELEBREX) 200 MG capsule      Total face-to-face time with patient:  25 minutes. Over 50% of encounter was spent on counseling and coordination of care. Return in about 3 months (around 02/26/2019).  [name] 11/26/2018 11:58 AM

## 2018-12-07 ENCOUNTER — Other Ambulatory Visit: Payer: Self-pay | Admitting: Family Medicine

## 2018-12-07 DIAGNOSIS — G8929 Other chronic pain: Secondary | ICD-10-CM

## 2018-12-07 DIAGNOSIS — M25512 Pain in left shoulder: Principal | ICD-10-CM

## 2018-12-13 ENCOUNTER — Other Ambulatory Visit: Payer: Self-pay | Admitting: Family Medicine

## 2018-12-13 DIAGNOSIS — Z720 Tobacco use: Secondary | ICD-10-CM

## 2018-12-16 ENCOUNTER — Encounter (HOSPITAL_COMMUNITY): Payer: Self-pay | Admitting: *Deleted

## 2018-12-16 ENCOUNTER — Other Ambulatory Visit: Payer: Self-pay

## 2018-12-16 ENCOUNTER — Emergency Department (HOSPITAL_COMMUNITY)
Admission: EM | Admit: 2018-12-16 | Discharge: 2018-12-16 | Disposition: A | Discharge status: Home / Self Care | Payer: Medicaid Other | Attending: Emergency Medicine | Admitting: Emergency Medicine

## 2018-12-16 DIAGNOSIS — I2510 Atherosclerotic heart disease of native coronary artery without angina pectoris: Secondary | ICD-10-CM | POA: Diagnosis not present

## 2018-12-16 DIAGNOSIS — J45909 Unspecified asthma, uncomplicated: Secondary | ICD-10-CM | POA: Insufficient documentation

## 2018-12-16 DIAGNOSIS — E119 Type 2 diabetes mellitus without complications: Secondary | ICD-10-CM | POA: Diagnosis not present

## 2018-12-16 DIAGNOSIS — R350 Frequency of micturition: Secondary | ICD-10-CM | POA: Diagnosis not present

## 2018-12-16 DIAGNOSIS — R21 Rash and other nonspecific skin eruption: Secondary | ICD-10-CM | POA: Diagnosis present

## 2018-12-16 DIAGNOSIS — Z7982 Long term (current) use of aspirin: Secondary | ICD-10-CM | POA: Insufficient documentation

## 2018-12-16 DIAGNOSIS — Z79899 Other long term (current) drug therapy: Secondary | ICD-10-CM | POA: Insufficient documentation

## 2018-12-16 DIAGNOSIS — L259 Unspecified contact dermatitis, unspecified cause: Secondary | ICD-10-CM | POA: Diagnosis not present

## 2018-12-16 DIAGNOSIS — I10 Essential (primary) hypertension: Secondary | ICD-10-CM | POA: Diagnosis not present

## 2018-12-16 DIAGNOSIS — Z87891 Personal history of nicotine dependence: Secondary | ICD-10-CM | POA: Insufficient documentation

## 2018-12-16 DIAGNOSIS — Z794 Long term (current) use of insulin: Secondary | ICD-10-CM | POA: Diagnosis not present

## 2018-12-16 LAB — URINALYSIS, ROUTINE W REFLEX MICROSCOPIC
Bacteria, UA: NONE SEEN
Bilirubin Urine: NEGATIVE
Glucose, UA: >=500 mg/dL — AB
Hgb urine dipstick: NEGATIVE
Ketones, ur: NEGATIVE mg/dL
Leukocytes,Ua: NEGATIVE
Nitrite: NEGATIVE
Protein, ur: 30 mg/dL — AB
Specific Gravity, Urine: 1.026 (ref 1.005–1.030)
pH: 7 (ref 5.0–8.0)

## 2018-12-16 MED ORDER — HYDROCORTISONE 1 % EX CREA
TOPICAL_CREAM | Freq: Once | CUTANEOUS | Status: AC
  Administered 2018-12-16: 12:00:00 via TOPICAL
  Filled 2018-12-16: qty 28

## 2018-12-16 NOTE — ED Triage Notes (Addendum)
Pt wants to be checked for Covid 19, after he was told he had no symptoms to be tested for, he decided he  wants to be seen for a rash on back left hand/arms, and frequent urination

## 2018-12-16 NOTE — ED Provider Notes (Signed)
[place] DEPT Provider Note   CSN: [id number] Arrival date & time: 12/16/18  1025    History   Chief Complaint Chief Complaint  Patient presents with   Rash    HPI [name] [name] is a 58 y.o. male.     [name] [name] is a 58 y.o. male history of CAD, COPD, GERD, hypertension, hyperlipidemia, migraines, diabetes, anxiety, depression and alcoholism, who presents to the emergency department initially requesting COVID-19 testing.  Patient denies fevers, chills, cough, shortness of breath, chest pain or any difficulty breathing.  He reports he has had a rash over the back of his left hand and wrist for the past few days that has been itchy and he has been scratching it persistently and wonders if this could be related to the coronavirus.  He has not tried anything for the rash over his hand, does report that he has been washing his hands extremely frequently and then using hand sanitizer afterwards multiple times a day. He reports this is making him feel very anxious and panicked.  He notes that he has been having some urinary frequency at night denies any penile discharge burning or discomfort with urination, no flank pain, nausea, vomiting, fevers or chills, has not noted any hematuria.  Patient is unsure how long this is been occurring but is requesting some medicine to "make him stop eating so much".  Reports he has been taking all of his medications regularly.       Past Medical History:  Diagnosis Date   Alcoholism [facility])    Anxiety    Arthritis    "qwhere"   Asthma    CAD S/P percutaneous coronary angioplasty 01/18/12; 10/2013   a. pRCA 3.5 x 18 vision BMS - 4.2 mm; b. 2/'15: mCx 3.5 x 12  Rebel BMS (3.6-3.7 mm)   Chronic back pain    "mid/lower" (08/01/2014)   COPD (chronic obstructive pulmonary disease) ([place])    "I'm seeing COPD dr now; don't know if I've got it" (08/01/2014)   Depression    GERD (gastroesophageal reflux disease)     Hyperlipidemia    Hypertension    Migraines    "once in awhile" (08/01/2014)   Non-ST elevation myocardial infarction (NSTEMI) ([place]) 10/2013   OSA on CPAP 01/19/2012   Type II diabetes mellitus ([place])     Patient Active Problem List   Diagnosis Date Noted   Chronic left shoulder pain 11/26/2018   Tinea pedis of both feet 08/23/2017   Depression, acute 02/17/2016   Hearing difficulty 12/10/2014   COPD, moderate ([place]) 11/22/2014   Cough    Angina, class II ([place]) 08/01/2014   H/O NSTEMI (non-ST elevated myocardial infarction) 11/07/2013   SOB (shortness of breath) on exertion 10/04/2013   Pre-ulcerative corn or callous 10/12/2012   Dental caries 10/12/2012   Back pain 08/24/2012   Tobacco abuse 08/24/2012   Obesity 07/31/2012   DM type 2 (diabetes mellitus, type 2) ([place]) 06/05/2012   CAD S/P percutaneous coronary angioplasty 01/21/2012   OSA on CPAP 01/19/2012   Essential hypertension 01/18/2012   Hyperlipidemia with target LDL less than 70 01/18/2012    Past Surgical History:  Procedure Laterality Date   CARDIAC CATHETERIZATION  07/2014   Left Main: Short, large-caliber vessel. Widely patent. Bifurcates into the LAD and Circumflex. Angiographically normal.   CORONARY ANGIOPLASTY WITH STENT PLACEMENT  01/2012; 11/07/2013; 08/01/2014   "1 + 2 + 1"    CORONARY ANGIOPLASTY WITH STENT PLACEMENT  07/2014  Severe single-vessel disease involving the bifurcation of OM1 and OM 2 with 99% in-stent restenosis in the bare-metal stent placed in the OM 2.   LEFT HEART CATHETERIZATION WITH CORONARY ANGIOGRAM N/A 01/20/2012   Procedure: LEFT HEART CATHETERIZATION WITH CORONARY ANGIOGRAM;  Surgeon: Jettie Booze, MD;  Location: [facility] CATH LAB;  Service: Cardiovascular;  Laterality: N/A;   LEFT HEART CATHETERIZATION WITH CORONARY ANGIOGRAM N/A 11/07/2013   Procedure: LEFT HEART CATHETERIZATION WITH CORONARY ANGIOGRAM;  Surgeon: Jettie Booze, MD;  Location: [facility]  CATH LAB;  Service: Cardiovascular;  Laterality: N/A;   LEFT HEART CATHETERIZATION WITH CORONARY ANGIOGRAM N/A 08/01/2014   Procedure: LEFT HEART CATHETERIZATION WITH CORONARY ANGIOGRAM;  Surgeon: Leonie Man, MD;  Location: [facility] CATH LAB;  Service: Cardiovascular;  Laterality: N/A;   PERCUTANEOUS CORONARY STENT INTERVENTION (PCI-S)  01/20/2012   Procedure: PERCUTANEOUS CORONARY STENT INTERVENTION (PCI-S);  Surgeon: Jettie Booze, MD;  Location: [facility] CATH LAB;  Service: Cardiovascular;;   PERCUTANEOUS CORONARY STENT INTERVENTION (PCI-S)  11/07/2013   Procedure: PERCUTANEOUS CORONARY STENT INTERVENTION (PCI-S);  Surgeon: Jettie Booze, MD;  Location: [facility] CATH LAB;  Service: Cardiovascular;;        Home Medications    Prior to Admission medications   Medication Sig Start Date End Date Taking? Authorizing Provider  ACCU-CHEK FASTCLIX LANCETS MISC 1 Units by Percutaneous route 4 (four) times daily. 07/01/15   [name], MD  albuterol (PROVENTIL HFA) 108 (90 Base) MCG/ACT inhaler INHALE 2 PUFFS INTO THE LUNGS EVERY 6 HOURS AS NEEDED FOR WHEEZING OR FOR SHORTNESS OF BREATH 10/04/18   Zenia Resides, MD  aspirin 81 MG chewable tablet Chew 1 tablet (81 mg total) by mouth daily. 10/04/13   [name], MD  atorvastatin (LIPITOR) 80 MG tablet TAKE ONE TABLET BY MOUTH DAILY 01/30/18   [name], MD  BD ULTRA-FINE PEN NEEDLES 29G X 12.7MM MISC USE TO WITH VICTOZA PEN, INJECTION ONCE DAILY. 05/05/17   [name], MD  Blood Glucose Monitoring Suppl (ACCU-CHEK AVIVA PLUS) w/Device KIT CHECK BLOOD GLUCOSE FASTING AND 2 HOURS AFTER BREAKFAST, LUNCH AND DINNER 05/11/18   [name], MD  buPROPion (WELLBUTRIN XL) 150 MG 24 hr tablet TAKE ONE TABLET BY MOUTH DAILY 12/13/18   [name], MD  carvedilol (COREG) 3.125 MG tablet TAKE ONE TABLET BY MOUTH TWICE A DAY WITH MEALS 10/08/18   [name], MD  carvedilol (COREG) 6.25 MG tablet Take 1 tablet (6.25 mg total) by mouth 2  (two) times daily with a meal. 10/04/18   Hensel, Jamal Collin, MD  celecoxib (CELEBREX) 200 MG capsule TAKE ONE CAPSULE BY MOUTH TWICE A DAY FOR 14 DAYS 12/07/18   [name], MD  citalopram (CELEXA) 20 MG tablet Take 1 tablet (20 mg total) by mouth daily. Patient taking differently: Take 20 mg by mouth every evening.  02/17/16   [name], MD  fluticasone furoate-vilanterol (BREO ELLIPTA) 100-25 MCG/INH AEPB Inhale 1 puff into the lungs daily. 07/19/18   Zenia Resides, MD  glucose blood (ACCU-CHEK AVIVA PLUS) test strip USE ONE STRIP TO TEST FOUR TIMES A DAY 08/14/18   [name], MD  hydrOXYzine (VISTARIL) 25 MG capsule Take 1 capsule (25 mg total) by mouth 3 (three) times daily as needed. 03/01/18   [name], MD  insulin aspart (NOVOLOG) 100 UNIT/ML injection Inject 1 Units into the skin 3 (three) times daily before meals. 08/14/18   [name],  MD  insulin glargine (LANTUS) 100 UNIT/ML injection Inject 0.5 mLs (50 Units total) into the skin 2 (two) times daily. 10/04/18   Zenia Resides, MD  Insulin Syringe-Needle U-100 (B-D INS SYR ULTRAFINE 1CC/31G) 31G X 5/16" 1 ML MISC Use up to 5 times daily with Novolog and Lantus dosing 08/15/18   [name], MD  JARDIANCE 25 MG TABS tablet TAKE ONE TABLET BY MOUTH DAILY 11/12/18   [name], MD  liraglutide (VICTOZA) 18 MG/3ML SOPN INJECT 0.3 MLS (1.8 MG TOTAL) INTO THE SKIN DAILY 03/29/18   Zenia Resides, MD  lisinopril (PRINIVIL,ZESTRIL) 2.5 MG tablet TAKE ONE TABLET BY MOUTH TWICE A DAY 07/12/18   [name], MD  metFORMIN (GLUCOPHAGE) 1000 MG tablet TAKE ONE TABLET BY MOUTH TWICE A DAY WITH A MEAL 08/25/18   [name], MD  mometasone-formoterol (DULERA) 200-5 MCG/ACT AERO Inhale 2 puffs into the lungs 2 (two) times daily. 03/29/18   Zenia Resides, MD  Multiple Vitamin (MULTIVITAMIN) tablet Take 1 tablet by mouth daily.    [provider]  nitroGLYCERIN (NITROSTAT) 0.4 MG SL tablet PLACE 1 TABLET UNDER  THE TONGUE EVERY 5 MINUTES FOR 3 DOSES AS NEEDED FOR CHEST PAIN *IF NO RELIEF, CALL 911* 08/14/18   [name], MD  NOVOLOG 100 UNIT/ML injection INJECT 50 UNITS INTO THE SKIN TWO TIMES A DAY BEFORE MEALS 07/11/18   [name], MD  Omega-3 Fatty Acids (FISH OIL PO) Take 1,000 mg by mouth 2 (two) times daily.     [provider]  prasugrel (EFFIENT) 10 MG TABS tablet Take 1 tablet (10 mg total) by mouth daily. 04/10/18   Almyra Deforest, PA  traMADol (ULTRAM) 50 MG tablet Take 1 tablet (50 mg total) by mouth every 6 (six) hours as needed. 08/14/18   [name], MD  varenicline (CHANTIX CONTINUING MONTH PAK) 1 MG tablet Take 1 tablet (1 mg total) by mouth 2 (two) times daily. 03/01/18   [name], MD    Family History Family History  Problem Relation Age of Onset   Heart attack Father 53   Breast cancer Mother    Hypertension Sister    Hyperlipidemia Sister    Hyperlipidemia Sister    Hypertension Sister    Heart attack Brother    Stroke Brother    Diabetes Brother    Diabetes Sister     Social History Social History   Tobacco Use   Smoking status: Former Smoker    Packs/day: 1.00    Years: 42.00    Pack years: 42.00    Types: Cigarettes    Start date: 09/13/1975    Last attempt to quit: 01/25/2018    Years since quitting: 0.8   Smokeless tobacco: Never Used   Tobacco comment: Bupropion helping  Substance Use Topics   Alcohol use: Yes    Alcohol/week: 6.0 standard drinks    Types: 6 Cans of beer per week    Comment: pt reports "I quit 66 days ago" (05/09/16)   Drug use: No    Comment: Crack Cocaine (denies 05/09/16)     Allergies   Shellfish allergy   Review of Systems Review of Systems  Constitutional: Negative for chills and fever.  HENT: Negative for congestion, ear pain, postnasal drip, rhinorrhea, sneezing and sore throat.   Respiratory: Negative for cough and shortness of breath.   Cardiovascular: Negative for chest pain.    Gastrointestinal: Negative for abdominal pain,  nausea and vomiting.  Genitourinary: Positive for frequency. Negative for discharge, dysuria, flank pain and hematuria.  Musculoskeletal: Negative for arthralgias and myalgias.  Skin: Positive for rash.  Neurological: Negative for syncope, weakness, light-headedness, numbness and headaches.  Psychiatric/Behavioral: The patient is nervous/anxious.      Physical Exam Updated Vital Signs BP 123/77 (BP Location: Right Arm)    Pulse 68    Temp 99.1 F (37.3 C) (Oral)    Resp 18    Ht '5\' 7"'  (1.702 m)    Wt 129.3 kg    SpO2 98%    BMI 44.64 kg/m   Physical Exam Vitals signs and nursing note reviewed.  Constitutional:      General: He is not in acute distress.    Appearance: Normal appearance. He is well-developed. He is obese. He is not ill-appearing, toxic-appearing or diaphoretic.     Comments: Patient appears somewhat anxious but is overall well-appearing and in no acute distress.  HENT:     Head: Normocephalic and atraumatic.     Nose: Nose normal. No congestion or rhinorrhea.     Mouth/Throat:     Mouth: Mucous membranes are moist.     Pharynx: Oropharynx is clear. No oropharyngeal exudate or posterior oropharyngeal erythema.  Eyes:     General:        Right eye: No discharge.        Left eye: No discharge.     Pupils: Pupils are equal, round, and reactive to light.  Neck:     Musculoskeletal: Neck supple.  Cardiovascular:     Rate and Rhythm: Normal rate and regular rhythm.     Pulses: Normal pulses.     Heart sounds: Normal heart sounds. No murmur. No friction rub. No gallop.   Pulmonary:     Effort: Pulmonary effort is normal. No respiratory distress.     Breath sounds: Normal breath sounds. No wheezing or rales.     Comments: Respirations equal and unlabored, patient able to speak in full sentences, lungs clear to auscultation bilaterally Abdominal:     General: Bowel sounds are normal. There is no distension.      Palpations: Abdomen is soft. There is no mass.     Tenderness: There is no abdominal tenderness. There is no guarding.  Musculoskeletal:        General: No deformity.  Skin:    General: Skin is warm and dry.     Capillary Refill: Capillary refill takes less than 2 seconds.     Comments: Raised erythematous rash over the dorsum of the left hand with multiple excoriations through this, no pustules, vesicles or petechiae, rash is not present elsewhere, there are a few small patches on the dorsum of the right hand as well.  There is no desquamation or skin sloughing, no surrounding erythema, drainage, fluctuance or induration.  Neurological:     Mental Status: He is alert and oriented to person, place, and time.     Coordination: Coordination normal.     Comments: Speech is clear, able to follow commands Moves extremities without ataxia, coordination intact  Psychiatric:        Mood and Affect: Mood is anxious.        Behavior: Behavior normal.      ED Treatments / Results  Labs (all labs ordered are listed, but only abnormal results are displayed) Labs Reviewed  URINALYSIS, ROUTINE W REFLEX MICROSCOPIC - Abnormal; Notable for the following components:      Result  Value   Glucose, UA >=500 (*)    Protein, ur 30 (*)    All other components within normal limits    EKG None  Radiology No results found.  Procedures Procedures (including critical care time)  Medications Ordered in ED Medications  hydrocortisone cream 1 % ( Topical Given 12/16/18 1149)     Initial Impression / Assessment and Plan / ED Course  I have reviewed the triage vital signs and the nursing notes.  Pertinent labs & imaging results that were available during my care of the patient were reviewed by me and considered in my medical decision making (see chart for details).  Patient presents to the emergency department with concern for COVID-19 infection, although patient has had no fevers, cough, shortness of  breath, no body aches.  He reports that he noticed a rash over the back of his left hand a few days ago and this is his primary symptom he also reports some urinary frequency at night.  Patient has normal vitals and is well-appearing here.  Low suspicion for [id number] infection and certainly not exhibiting symptoms severe enough to warrant admission.  Patient does have a rash over the dorsum of the left hand that is consistent with contact dermatitis likely from frequent handwashing, there are excoriations but there are no signs of superimposed bacterial infection.  Will will prescribe hydrocortisone cream, I have also encourage patient to moisturize frequently to help with inflammation.  Patient did report urinary frequency without dysuria unsure how long this is been occurring.  He denies any associated belly pain or flank pain.  Urine does not show any signs of infection, just so glucose which is to be expected as patient is diabetic, no ketones to suggest DKA, patient has been compliant with medications, will have him continue with insulin and other regular medications, I suspect some of these may be causing his urinary frequency will have him follow-up with his PCP regarding this.  Provided patient with reassurance and he expresses understanding and agreement with plan.   At this time there does not appear to be any evidence of an acute emergency medical condition and the patient appears stable for discharge with appropriate outpatient follow up. Diagnosis was discussed with patient who verbalizes understanding and is agreeable to discharge.    Final Clinical Impressions(s) / ED Diagnoses   Final diagnoses:  Contact dermatitis, unspecified contact dermatitis type, unspecified trigger  Urinary frequency    ED Discharge Orders    None       Janet Berlin 12/18/18 0745    Quintella Reichert, MD 12/18/18 [phone]

## 2018-12-16 NOTE — Discharge Instructions (Signed)
Please use hydrocortisone cream over the rash on your hand twice daily, this rash is likely due to contact dermatitis from frequent handwashing.  Also make sure you are keeping your hands well moisturized, avoid using very hot water or soaps with heavy chemicals.   Please follow-up with your primary care doctor regarding urinary frequency, your urine shows no signs of infection here today.  This may be related to some of the medication you used to control your diabetes and blood pressure.

## 2018-12-19 ENCOUNTER — Other Ambulatory Visit: Payer: Self-pay | Admitting: Family Medicine

## 2018-12-19 DIAGNOSIS — W57XXXA Bitten or stung by nonvenomous insect and other nonvenomous arthropods, initial encounter: Secondary | ICD-10-CM

## 2018-12-23 ENCOUNTER — Other Ambulatory Visit: Payer: Self-pay | Admitting: Family Medicine

## 2018-12-23 DIAGNOSIS — M25512 Pain in left shoulder: Principal | ICD-10-CM

## 2018-12-23 DIAGNOSIS — G8929 Other chronic pain: Secondary | ICD-10-CM

## 2018-12-24 ENCOUNTER — Telehealth: Payer: Self-pay

## 2018-12-24 NOTE — Telephone Encounter (Signed)
Pharmacist LVM on nurse line informing PCP of the drug interaction warning between celexa and hydroxyzine. Pharmacist stated the interaction is a "prolonged t-wave." If pcp aware of this and ok, call back to ok the continuing of prescriptions.

## 2018-12-25 NOTE — Telephone Encounter (Signed)
The patient has been. Pharmacy has been advised we are aware and he has been on for a long time.

## 2018-12-31 ENCOUNTER — Ambulatory Visit: Payer: Medicaid Other | Admitting: Podiatry

## 2018-12-31 ENCOUNTER — Encounter: Payer: Self-pay | Admitting: Podiatry

## 2018-12-31 ENCOUNTER — Other Ambulatory Visit: Payer: Self-pay

## 2018-12-31 VITALS — Temp 98.1°F

## 2018-12-31 DIAGNOSIS — M79675 Pain in left toe(s): Secondary | ICD-10-CM | POA: Diagnosis not present

## 2018-12-31 DIAGNOSIS — B351 Tinea unguium: Secondary | ICD-10-CM | POA: Diagnosis not present

## 2018-12-31 DIAGNOSIS — E119 Type 2 diabetes mellitus without complications: Secondary | ICD-10-CM

## 2018-12-31 DIAGNOSIS — M79674 Pain in right toe(s): Secondary | ICD-10-CM | POA: Diagnosis not present

## 2018-12-31 NOTE — Progress Notes (Signed)
Subjective: [name] [name] presents today for preventative diabetic foot care. He presents with painful, thick toenails 1-5 b/l that he cannot cut and which interfere with daily activities.  Pain is aggravated when wearing enclosed shoe gear and relieved with periodic professional debridement.  [name], MD is his PCP and last visit was 11/26/2018.   Current Outpatient Medications:  .  ACCU-CHEK FASTCLIX LANCETS MISC, 1 Units by Percutaneous route 4 (four) times daily., Disp: 100 each, Rfl: 12 .  albuterol (PROVENTIL HFA) 108 (90 Base) MCG/ACT inhaler, INHALE 2 PUFFS INTO THE LUNGS EVERY 6 HOURS AS NEEDED FOR WHEEZING OR FOR SHORTNESS OF BREATH, Disp: 6.7 each, Rfl: 2 .  aspirin 81 MG chewable tablet, Chew 1 tablet (81 mg total) by mouth daily., Disp: 180 tablet, Rfl: 2 .  atorvastatin (LIPITOR) 80 MG tablet, TAKE ONE TABLET BY MOUTH DAILY, Disp: 90 tablet, Rfl: 3 .  BD ULTRA-FINE PEN NEEDLES 29G X 12.7MM MISC, USE TO WITH VICTOZA PEN, INJECTION ONCE DAILY., Disp: 100 each, Rfl: 10 .  Blood Glucose Monitoring Suppl (ACCU-CHEK AVIVA PLUS) w/Device KIT, CHECK BLOOD GLUCOSE FASTING AND 2 HOURS AFTER BREAKFAST, LUNCH AND DINNER, Disp: 1 kit, Rfl: 0 .  buPROPion (WELLBUTRIN XL) 150 MG 24 hr tablet, TAKE ONE TABLET BY MOUTH DAILY, Disp: 30 tablet, Rfl: 0 .  carvedilol (COREG) 3.125 MG tablet, TAKE ONE TABLET BY MOUTH TWICE A DAY WITH MEALS, Disp: 60 tablet, Rfl: 0 .  carvedilol (COREG) 6.25 MG tablet, Take 1 tablet (6.25 mg total) by mouth 2 (two) times daily with a meal., Disp: 60 tablet, Rfl: 1 .  celecoxib (CELEBREX) 200 MG capsule, TAKE ONE CAPSULE BY MOUTH TWICE A DAY FOR 14 DAYS, Disp: 30 capsule, Rfl: 0 .  citalopram (CELEXA) 20 MG tablet, Take 1 tablet (20 mg total) by mouth daily. (Patient taking differently: Take 20 mg by mouth every evening. ), Disp: 30 tablet, Rfl: 12 .  fluticasone furoate-vilanterol (BREO ELLIPTA) 100-25 MCG/INH AEPB, Inhale 1 puff into the lungs daily., Disp: 2 each,  Rfl: 0 .  glucose blood (ACCU-CHEK AVIVA PLUS) test strip, USE ONE STRIP TO TEST FOUR TIMES A DAY, Disp: 300 each, Rfl: 11 .  hydrOXYzine (VISTARIL) 25 MG capsule, TAKE 1 CAPSULE BY MOUTH THREE TIMES A DAY DAILY AS NEEDED, Disp: 30 capsule, Rfl: 0 .  insulin aspart (NOVOLOG) 100 UNIT/ML injection, Inject 1 Units into the skin 3 (three) times daily before meals., Disp: 10 mL, Rfl: 11 .  insulin glargine (LANTUS) 100 UNIT/ML injection, Inject 0.5 mLs (50 Units total) into the skin 2 (two) times daily., Disp: 4 vial, Rfl: 5 .  Insulin Syringe-Needle U-100 (B-D INS SYR ULTRAFINE 1CC/31G) 31G X 5/16" 1 ML MISC, Use up to 5 times daily with Novolog and Lantus dosing, Disp: 100 each, Rfl: 3 .  JARDIANCE 25 MG TABS tablet, TAKE ONE TABLET BY MOUTH DAILY, Disp: 30 tablet, Rfl: 2 .  liraglutide (VICTOZA) 18 MG/3ML SOPN, INJECT 0.3 MLS (1.8 MG TOTAL) INTO THE SKIN DAILY, Disp: 9 pen, Rfl: 10 .  lisinopril (PRINIVIL,ZESTRIL) 2.5 MG tablet, TAKE ONE TABLET BY MOUTH TWICE A DAY, Disp: 180 tablet, Rfl: 2 .  metFORMIN (GLUCOPHAGE) 1000 MG tablet, TAKE ONE TABLET BY MOUTH TWICE A DAY WITH A MEAL, Disp: 180 tablet, Rfl: 10 .  mometasone-formoterol (DULERA) 200-5 MCG/ACT AERO, Inhale 2 puffs into the lungs 2 (two) times daily., Disp: 1 Inhaler, Rfl: 11 .  Multiple Vitamin (MULTIVITAMIN) tablet, Take 1 tablet by mouth daily., Disp: ,  Rfl:  .  nitroGLYCERIN (NITROSTAT) 0.4 MG SL tablet, PLACE 1 TABLET UNDER THE TONGUE EVERY 5 MINUTES FOR 3 DOSES AS NEEDED FOR CHEST PAIN *IF NO RELIEF, CALL 911*, Disp: 100 tablet, Rfl: 2 .  NOVOLOG 100 UNIT/ML injection, INJECT 50 UNITS INTO THE SKIN TWO TIMES A DAY BEFORE MEALS, Disp: 30 vial, Rfl: 3 .  Omega-3 Fatty Acids (FISH OIL PO), Take 1,000 mg by mouth 2 (two) times daily. , Disp: , Rfl:  .  prasugrel (EFFIENT) 10 MG TABS tablet, Take 1 tablet (10 mg total) by mouth daily., Disp: 90 tablet, Rfl: 3 .  traMADol (ULTRAM) 50 MG tablet, Take 1 tablet (50 mg total) by mouth every 6  (six) hours as needed., Disp: 20 tablet, Rfl: 0 .  varenicline (CHANTIX CONTINUING MONTH PAK) 1 MG tablet, Take 1 tablet (1 mg total) by mouth 2 (two) times daily., Disp: 180 tablet, Rfl: 0  Allergies  Allergen Reactions  . Shellfish Allergy Nausea And Vomiting    OYSTERS    Objective:  Vascular Examination: Capillary refill time immediate x 10 digits.  Dorsalis pedis and Posterior tibial pulses palpable b/l.  Digital hair present x 10 digits.  Skin temperature gradient WNL b/l.  Dermatological Examination: Skin with normal turgor, texture and tone b/l.  Toenails 1-5 b/l discolored, thick, dystrophic with subungual debris and pain with palpation to nailbeds due to thickness of nails.  Musculoskeletal: Muscle strength 5/5 to all LE muscle groups  No gross bony deformities b/l.  No pain, crepitus or joint limitation noted with ROM.   Neurological: Sensation intact with 10 gram monofilament.  Vibratory sensation intact.  Assessment: Painful onychomycosis toenails 1-5 b/l  NIDDM  Plan: 1. Toenails 1-5 b/l were debrided in length and girth without iatrogenic bleeding. 2. Patient to continue soft, supportive shoe gear daily. 3. Patient to report any pedal injuries to medical professional immediately. 4. Follow up 3 months.  5. Patient/POA to call should there be a concern in the interim.

## 2018-12-31 NOTE — Patient Instructions (Signed)
Diabetes Mellitus and Foot Care  Foot care is an important part of your health, especially when you have diabetes. Diabetes may cause you to have problems because of poor blood flow (circulation) to your feet and legs, which can cause your skin to:   Become thinner and drier.   Break more easily.   Heal more slowly.   Peel and crack.  You may also have nerve damage (neuropathy) in your legs and feet, causing decreased feeling in them. This means that you may not notice minor injuries to your feet that could lead to more serious problems. Noticing and addressing any potential problems early is the best way to prevent future foot problems.  How to care for your feet  Foot hygiene   Wash your feet daily with warm water and mild soap. Do not use hot water. Then, pat your feet and the areas between your toes until they are completely dry. Do not soak your feet as this can dry your skin.   Trim your toenails straight across. Do not dig under them or around the cuticle. File the edges of your nails with an emery board or nail file.   Apply a moisturizing lotion or petroleum jelly to the skin on your feet and to dry, brittle toenails. Use lotion that does not contain alcohol and is unscented. Do not apply lotion between your toes.  Shoes and socks   Wear clean socks or stockings every day. Make sure they are not too tight. Do not wear knee-high stockings since they may decrease blood flow to your legs.   Wear shoes that fit properly and have enough cushioning. Always look in your shoes before you put them on to be sure there are no objects inside.   To break in new shoes, wear them for just a few hours a day. This prevents injuries on your feet.  Wounds, scrapes, corns, and calluses   Check your feet daily for blisters, cuts, bruises, sores, and redness. If you cannot see the bottom of your feet, use a mirror or ask someone for help.   Do not cut corns or calluses or try to remove them with medicine.   If you  find a minor scrape, cut, or break in the skin on your feet, keep it and the skin around it clean and dry. You may clean these areas with mild soap and water. Do not clean the area with peroxide, alcohol, or iodine.   If you have a wound, scrape, corn, or callus on your foot, look at it several times a day to make sure it is healing and not infected. Check for:  ? Redness, swelling, or pain.  ? Fluid or blood.  ? Warmth.  ? Pus or a bad smell.  General instructions   Do not cross your legs. This may decrease blood flow to your feet.   Do not use heating pads or hot water bottles on your feet. They may burn your skin. If you have lost feeling in your feet or legs, you may not know this is happening until it is too late.   Protect your feet from hot and cold by wearing shoes, such as at the beach or on hot pavement.   Schedule a complete foot exam at least once a year (annually) or more often if you have foot problems. If you have foot problems, report any cuts, sores, or bruises to your health care provider immediately.  Contact a health care provider if:     You have a medical condition that increases your risk of infection and you have any cuts, sores, or bruises on your feet.   You have an injury that is not healing.   You have redness on your legs or feet.   You feel burning or tingling in your legs or feet.   You have pain or cramps in your legs and feet.   Your legs or feet are numb.   Your feet always feel cold.   You have pain around a toenail.  Get help right away if:   You have a wound, scrape, corn, or callus on your foot and:  ? You have pain, swelling, or redness that gets worse.  ? You have fluid or blood coming from the wound, scrape, corn, or callus.  ? Your wound, scrape, corn, or callus feels warm to the touch.  ? You have pus or a bad smell coming from the wound, scrape, corn, or callus.  ? You have a fever.  ? You have a red line going up your leg.  Summary   Check your feet every day  for cuts, sores, red spots, swelling, and blisters.   Moisturize feet and legs daily.   Wear shoes that fit properly and have enough cushioning.   If you have foot problems, report any cuts, sores, or bruises to your health care provider immediately.   Schedule a complete foot exam at least once a year (annually) or more often if you have foot problems.  This information is not intended to replace advice given to you by your health care provider. Make sure you discuss any questions you have with your health care provider.  Document Released: 08/26/2000 Document Revised: 10/11/2017 Document Reviewed: 09/30/2016  Elsevier Interactive Patient Education  2019 [employer]

## 2019-01-01 ENCOUNTER — Ambulatory Visit: Payer: Medicaid Other | Admitting: Podiatry

## 2019-01-05 ENCOUNTER — Other Ambulatory Visit: Payer: Self-pay | Admitting: Family Medicine

## 2019-01-05 DIAGNOSIS — G8929 Other chronic pain: Secondary | ICD-10-CM

## 2019-01-05 DIAGNOSIS — M25512 Pain in left shoulder: Principal | ICD-10-CM

## 2019-01-08 ENCOUNTER — Other Ambulatory Visit: Payer: Self-pay | Admitting: Family Medicine

## 2019-01-08 DIAGNOSIS — E1159 Type 2 diabetes mellitus with other circulatory complications: Secondary | ICD-10-CM

## 2019-01-08 DIAGNOSIS — Z794 Long term (current) use of insulin: Principal | ICD-10-CM

## 2019-01-13 ENCOUNTER — Other Ambulatory Visit: Payer: Self-pay | Admitting: Family Medicine

## 2019-01-13 DIAGNOSIS — Z720 Tobacco use: Secondary | ICD-10-CM

## 2019-01-22 ENCOUNTER — Other Ambulatory Visit: Payer: Self-pay | Admitting: Family Medicine

## 2019-01-22 DIAGNOSIS — G8929 Other chronic pain: Secondary | ICD-10-CM

## 2019-01-22 DIAGNOSIS — M25512 Pain in left shoulder: Secondary | ICD-10-CM

## 2019-01-24 ENCOUNTER — Other Ambulatory Visit: Payer: Self-pay | Admitting: Family Medicine

## 2019-01-24 DIAGNOSIS — J449 Chronic obstructive pulmonary disease, unspecified: Secondary | ICD-10-CM

## 2019-01-31 ENCOUNTER — Other Ambulatory Visit: Payer: Self-pay

## 2019-01-31 ENCOUNTER — Telehealth: Payer: Medicaid Other | Admitting: Pharmacist

## 2019-01-31 ENCOUNTER — Telehealth: Payer: Self-pay | Admitting: Pharmacist

## 2019-01-31 DIAGNOSIS — Z87891 Personal history of nicotine dependence: Secondary | ICD-10-CM

## 2019-01-31 DIAGNOSIS — Z794 Long term (current) use of insulin: Secondary | ICD-10-CM

## 2019-01-31 DIAGNOSIS — F329 Major depressive disorder, single episode, unspecified: Secondary | ICD-10-CM

## 2019-01-31 DIAGNOSIS — E1110 Type 2 diabetes mellitus with ketoacidosis without coma: Secondary | ICD-10-CM

## 2019-01-31 DIAGNOSIS — E1159 Type 2 diabetes mellitus with other circulatory complications: Secondary | ICD-10-CM

## 2019-01-31 DIAGNOSIS — F32A Depression, unspecified: Secondary | ICD-10-CM

## 2019-01-31 MED ORDER — LIRAGLUTIDE 18 MG/3ML ~~LOC~~ SOPN: PEN_INJECTOR | SUBCUTANEOUS | 3 refills | Status: DC

## 2019-01-31 MED ORDER — EMPAGLIFLOZIN 25 MG PO TABS: 25.0000 mg | ORAL_TABLET | Freq: Every day | ORAL | 3 refills | Status: DC

## 2019-01-31 MED ORDER — CITALOPRAM HYDROBROMIDE 20 MG PO TABS: 20.0000 mg | ORAL_TABLET | Freq: Every day | ORAL | 0 refills | Status: DC

## 2019-01-31 NOTE — Assessment & Plan Note (Signed)
Former tobacco abuse, reports complete abstinence for 1 year. -Removed varenicline from med list -Continue bupropion XR 150 mg once daliy

## 2019-01-31 NOTE — Assessment & Plan Note (Signed)
Diabetes longstanding, fasting blood sugar currently uncontrolled. Patient is able to verbalize appropriate hypoglycemia management plan. Patient is adherent with medication. -Continued basal insulin Lantus (insulin glargine) at 45 units twice daily.   -Continued rapid insulin Novolog (insulin aspart) at 45 units BID.  -Continued GLP-1 Victoza (liraglutide) 1.8 mg daily.  -Continued SGLT2-I Jardiance (empagliflozin) 25 mg.  -Sent refills for Victoza and Jardiance -Next A1C anticipated 02/2019.

## 2019-01-31 NOTE — Assessment & Plan Note (Signed)
>>  ASSESSMENT AND PLAN FOR TYPE 2 DIABETES MELLITUS WITH HYPERGLYCEMIA, WITH LONG-TERM CURRENT USE OF INSULIN  ([facility]) WRITTEN ON 01/31/2019 12:57 PM BY KOVAL, PETER G, RPH-CPP  Diabetes longstanding, fasting blood sugar currently uncontrolled. Patient is able to verbalize appropriate hypoglycemia management plan. Patient is adherent with medication. -Continued basal insulin  Lantus  (insulin  glargine) at 45 units twice daily.   -Continued rapid insulin  Novolog  (insulin  aspart) at 45 units BID.  -Continued GLP-1 Victoza  (liraglutide ) 1.8 mg daily.  -Continued SGLT2-I Jardiance  (empagliflozin ) 25 mg.  -Sent refills for Victoza  and Jardiance  -Next A1C anticipated 02/2019.

## 2019-01-31 NOTE — Telephone Encounter (Addendum)
S:    Patient contacted via telephone for medication management. He reports that he has been doing well and staying home.  Patient was referred on 08/14/2018.  Patient was last seen by Primary Care Provider on 11/26/18.   Patient reports adherence with medications.  Current diabetes medications include: Lantus 45 units BID, Novolog 45 units BID, Victoza 1.8 mg once daily, Jardiance 25 mg daily, metformin 1000 mg BID Current hypertension medications include: lisinopril 2.5 mg BID, carvedilol 6.25 mg BID -He reports taking two 6.25 mg carvedilol tablets BID, likely due to confusion about finishing 3.125 mg bottle and starting 6.25 mg bottle.  Patient denies hypoglycemic events. He reports that his blood sugars have been fluctuating, with the highest reading being 209 about 2 weeks ago and the lowest reading being 91. He does not check his blood pressure at home, but reports that "it's always good," and denies any dizziness.  Patient reported weight: 290 lbs Patient reported dietary habits: eating chicken, [place], peanut butter sandwiches; drinking water, Gatorade zero, 2% milk, occasionally diet pepsi or diet Mt. Dew. Patient reported exercise: walking about 15-20 minutes every day; states he wanted to join a gym, but has been unable to due to them closing because of COVID-19.  He reports that his breathing has been doing well especially since he has been staying home. When asked about his smoking status, he reports that he continues to be tobacco free and that it has been about 1 year since quitting.   A/P: Diabetes longstanding, fasting blood sugar currently uncontrolled. Patient is able to verbalize appropriate hypoglycemia management plan. Patient is adherent with medication. -Continued basal insulin Lantus (insulin glargine) at 45 units twice daily.   -Continued rapid insulin Novolog (insulin aspart) at 45 units BID.  -Continued GLP-1 Victoza (liraglutide) 1.8 mg daily.  -Continued SGLT2-I  Jardiance (empagliflozin) 25 mg.  -Sent refills for Victoza and Jardiance -Next A1C anticipated 02/2019.   ASCVD risk - secondary prevention in patient with DM. Last LDL is controlled (03/2018). -Continued aspirin 81 mg  -Continued atorvastatin 80 mg.   History of CAD with Angina currently controlled. -Continue carvedilol 6.25 mg BID and counseled that he should only be taking 1 tablet twice daily.  Hypertension longstanding currently controlled.  BP goal = 130/80 mmHg. Patient reports adherence with medication. -Continue lisinopril 2.5 mg BID and carvedilol 6.25 mg BID.  COPD, currently controlled. Reports using albuterol once or twice a week in addition to [facility] daily. -Continue albuterol PRN and Dulera 200/5 mg 2 puffs BID  Former tobacco abuse, currently controlled. Quit smoking about 1 year ago. -Removed varenicline from med list -Continue bupropion XR 150 mg once daliy  Depression, currently controlled -Sent one month supply of Celexa (citalopram) 20 mg daily to pharmacy as patient reports he has not been able to get in contact with [facility].  Hearing difficulty, improved per patient -Added cipro-dex otic suspension to med list    Follow up PCP Clinic Visit in June. Follow up Pharmacist Clinic Visit in July.   Patient seen with Jackson Latino, PharmD, PGY1 Pharmacy Resident and Catie Darnelle Maffucci, PharmD,  PGY2 Pharmacy Resident.

## 2019-02-05 ENCOUNTER — Other Ambulatory Visit: Payer: Self-pay | Admitting: Family Medicine

## 2019-02-05 DIAGNOSIS — G8929 Other chronic pain: Secondary | ICD-10-CM

## 2019-02-10 ENCOUNTER — Other Ambulatory Visit: Payer: Self-pay | Admitting: Family Medicine

## 2019-02-10 DIAGNOSIS — Z720 Tobacco use: Secondary | ICD-10-CM

## 2019-02-13 ENCOUNTER — Other Ambulatory Visit: Payer: Self-pay | Admitting: Family Medicine

## 2019-02-13 DIAGNOSIS — W57XXXA Bitten or stung by nonvenomous insect and other nonvenomous arthropods, initial encounter: Secondary | ICD-10-CM

## 2019-02-14 ENCOUNTER — Encounter (HOSPITAL_COMMUNITY): Payer: Self-pay | Admitting: Emergency Medicine

## 2019-02-14 ENCOUNTER — Emergency Department (HOSPITAL_COMMUNITY)
Admission: EM | Admit: 2019-02-14 | Discharge: 2019-02-14 | Disposition: A | Discharge status: Home / Self Care | Payer: Medicaid Other | Attending: Emergency Medicine | Admitting: Emergency Medicine

## 2019-02-14 ENCOUNTER — Other Ambulatory Visit: Payer: Self-pay

## 2019-02-14 DIAGNOSIS — I2510 Atherosclerotic heart disease of native coronary artery without angina pectoris: Secondary | ICD-10-CM | POA: Diagnosis not present

## 2019-02-14 DIAGNOSIS — Z87891 Personal history of nicotine dependence: Secondary | ICD-10-CM | POA: Insufficient documentation

## 2019-02-14 DIAGNOSIS — Z955 Presence of coronary angioplasty implant and graft: Secondary | ICD-10-CM | POA: Diagnosis not present

## 2019-02-14 DIAGNOSIS — Z7982 Long term (current) use of aspirin: Secondary | ICD-10-CM | POA: Insufficient documentation

## 2019-02-14 DIAGNOSIS — Z79899 Other long term (current) drug therapy: Secondary | ICD-10-CM | POA: Insufficient documentation

## 2019-02-14 DIAGNOSIS — E119 Type 2 diabetes mellitus without complications: Secondary | ICD-10-CM | POA: Diagnosis not present

## 2019-02-14 DIAGNOSIS — L03311 Cellulitis of abdominal wall: Secondary | ICD-10-CM | POA: Diagnosis not present

## 2019-02-14 DIAGNOSIS — B379 Candidiasis, unspecified: Secondary | ICD-10-CM | POA: Insufficient documentation

## 2019-02-14 DIAGNOSIS — J449 Chronic obstructive pulmonary disease, unspecified: Secondary | ICD-10-CM | POA: Diagnosis not present

## 2019-02-14 DIAGNOSIS — I10 Essential (primary) hypertension: Secondary | ICD-10-CM | POA: Diagnosis not present

## 2019-02-14 DIAGNOSIS — Z794 Long term (current) use of insulin: Secondary | ICD-10-CM | POA: Diagnosis not present

## 2019-02-14 DIAGNOSIS — R21 Rash and other nonspecific skin eruption: Secondary | ICD-10-CM | POA: Diagnosis present

## 2019-02-14 MED ORDER — KETOCONAZOLE 2 % EX CREA: 1.0000 "application " | TOPICAL_CREAM | Freq: Every day | CUTANEOUS | 0 refills | Status: DC

## 2019-02-14 MED ORDER — CEPHALEXIN 500 MG PO CAPS: 500.0000 mg | ORAL_CAPSULE | Freq: Four times a day (QID) | ORAL | 0 refills | Status: DC

## 2019-02-14 NOTE — ED Triage Notes (Addendum)
Pt reports rash on lower, mid abdomen starting one week ago.  Pt reports it is itchy, has applied corn starch and ice with no relief.    Pt also reports "coughing a few times, more like clearing my throat."  Denies any sick contacts.

## 2019-02-14 NOTE — ED Notes (Signed)
Discharge paperwork and prescriptions reviewed with pt, including importance of taking entirety of antibiotic prescription.  Pt verbalized understanding, was wheeled to ED entrance via wheelchair by NT.

## 2019-02-14 NOTE — ED Provider Notes (Signed)
[facility] DEPT Provider Note   CSN: [id number] Arrival date & time: 02/14/19  1749    History   Chief Complaint No chief complaint on file.   HPI [name] [name] is a 58 y.o. male.     HPI Patient presents with rash on the abdomen.  Began around a week ago.  Itchy.  States he has tried cornstarch without relief.  States it hurts at times.  States stays wet.  He is a diabetic.  States his sugar was 180 this morning.  No fevers.  No cough.  States the redness is moved more up his abdomen now. No fevers.  Has an occasional cough.  No trauma. Past Medical History:  Diagnosis Date  . Alcoholism ([place])   . Anxiety   . Arthritis    "qwhere"  . Asthma   . CAD S/P percutaneous coronary angioplasty 01/18/12; 10/2013   a. pRCA 3.5 x 18 vision BMS - 4.2 mm; b. 2/'15: mCx 3.5 x 12  Rebel BMS (3.6-3.7 mm)  . Chronic back pain    "mid/lower" (08/01/2014)  . COPD (chronic obstructive pulmonary disease) ([place])    "I'm seeing COPD dr now; don't know if I've got it" (08/01/2014)  . Depression   . GERD (gastroesophageal reflux disease)   . Hyperlipidemia   . Hypertension   . Migraines    "once in awhile" (08/01/2014)  . Non-ST elevation myocardial infarction (NSTEMI) ([place]) 10/2013  . OSA on CPAP 01/19/2012  . Type II diabetes mellitus [facility])     Patient Active Problem List   Diagnosis Date Noted  . Chronic left shoulder pain 11/26/2018  . Tinea pedis of both feet 08/23/2017  . Depression, acute 02/17/2016  . Hearing difficulty 12/10/2014  . COPD, moderate ([place]) 11/22/2014  . Cough   . Angina, class II ([place]) 08/01/2014  . H/O NSTEMI (non-ST elevated myocardial infarction) 11/07/2013  . SOB (shortness of breath) on exertion 10/04/2013  . Pre-ulcerative corn or callous 10/12/2012  . Dental caries 10/12/2012  . Back pain 08/24/2012  . Former tobacco use 08/24/2012  . Obesity 07/31/2012  . DM type 2 (diabetes mellitus, type 2) ([place]) 06/05/2012  . CAD S/P  percutaneous coronary angioplasty 01/21/2012  . OSA on CPAP 01/19/2012  . Essential hypertension 01/18/2012  . Hyperlipidemia with target LDL less than 70 01/18/2012    Past Surgical History:  Procedure Laterality Date  . CARDIAC CATHETERIZATION  07/2014   Left Main: Short, large-caliber vessel. Widely patent. Bifurcates into the LAD and Circumflex. Angiographically normal.  . CORONARY ANGIOPLASTY WITH STENT PLACEMENT  01/2012; 11/07/2013; 08/01/2014   "1 + 2 + 1"   . CORONARY ANGIOPLASTY WITH STENT PLACEMENT  07/2014   Severe single-vessel disease involving the bifurcation of OM1 and OM 2 with 99% in-stent restenosis in the bare-metal stent placed in the OM 2.  . LEFT HEART CATHETERIZATION WITH CORONARY ANGIOGRAM N/A 01/20/2012   Procedure: LEFT HEART CATHETERIZATION WITH CORONARY ANGIOGRAM;  Surgeon: Jettie Booze, MD;  Location: [facility] CATH LAB;  Service: Cardiovascular;  Laterality: N/A;  . LEFT HEART CATHETERIZATION WITH CORONARY ANGIOGRAM N/A 11/07/2013   Procedure: LEFT HEART CATHETERIZATION WITH CORONARY ANGIOGRAM;  Surgeon: Jettie Booze, MD;  Location: [facility] CATH LAB;  Service: Cardiovascular;  Laterality: N/A;  . LEFT HEART CATHETERIZATION WITH CORONARY ANGIOGRAM N/A 08/01/2014   Procedure: LEFT HEART CATHETERIZATION WITH CORONARY ANGIOGRAM;  Surgeon: Leonie Man, MD;  Location: [facility] CATH LAB;  Service: Cardiovascular;  Laterality: N/A;  . PERCUTANEOUS  CORONARY STENT INTERVENTION (PCI-S)  01/20/2012   Procedure: PERCUTANEOUS CORONARY STENT INTERVENTION (PCI-S);  Surgeon: Jettie Booze, MD;  Location: [facility] CATH LAB;  Service: Cardiovascular;;  . PERCUTANEOUS CORONARY STENT INTERVENTION (PCI-S)  11/07/2013   Procedure: PERCUTANEOUS CORONARY STENT INTERVENTION (PCI-S);  Surgeon: Jettie Booze, MD;  Location: [facility] CATH LAB;  Service: Cardiovascular;;        Home Medications    Prior to Admission medications   Medication Sig Start Date End Date Taking? Authorizing  Provider  ACCU-CHEK FASTCLIX LANCETS MISC 1 Units by Percutaneous route 4 (four) times daily. 07/01/15   [name], MD  albuterol (PROVENTIL HFA) 108 (90 Base) MCG/ACT inhaler INHALE 2 PUFFS INTO THE LUNGS EVERY 6 HOURS AS NEEDED FOR WHEEZING OR FOR SHORTNESS OF BREATH 01/24/19   [name], MD  aspirin 81 MG chewable tablet Chew 1 tablet (81 mg total) by mouth daily. 10/04/13   [name], MD  atorvastatin (LIPITOR) 80 MG tablet TAKE ONE TABLET BY MOUTH DAILY 01/30/18   [name], MD  BD ULTRA-FINE PEN NEEDLES 29G X 12.7MM MISC USE TO WITH VICTOZA PEN, INJECTION ONCE DAILY. 05/05/17   [name], MD  Blood Glucose Monitoring Suppl (ACCU-CHEK AVIVA PLUS) w/Device KIT CHECK BLOOD GLUCOSE FASTING AND 2 HOURS AFTER BREAKFAST, LUNCH AND DINNER 05/11/18   [name], MD  buPROPion (WELLBUTRIN XL) 150 MG 24 hr tablet TAKE ONE TABLET BY MOUTH DAILY 02/10/19   [name], MD  carvedilol (COREG) 6.25 MG tablet Take 1 tablet (6.25 mg total) by mouth 2 (two) times daily with a meal. 10/04/18   Hensel, Jamal Collin, MD  celecoxib (CELEBREX) 200 MG capsule TAKE ONE CAPSULE BY MOUTH TWICE A DAY FOR 14 DAYS 02/05/19   [name], MD  cephALEXin (KEFLEX) 500 MG capsule Take 1 capsule (500 mg total) by mouth 4 (four) times daily. 02/14/19   Davonna Belling, MD  ciprofloxacin-dexamethasone (CIPRODEX) OTIC suspension Place 4 drops into the left ear 2 (two) times daily.    [provider]  citalopram (CELEXA) 20 MG tablet Take 1 tablet (20 mg total) by mouth daily. Patient not taking: Reported on 01/31/2019 01/31/19   Zenia Resides, MD  empagliflozin (JARDIANCE) 25 MG TABS tablet Take 25 mg by mouth daily. 01/31/19   Zenia Resides, MD  glucose blood (ACCU-CHEK AVIVA PLUS) test strip USE ONE STRIP TO TEST FOUR TIMES A DAY 08/14/18   [name], MD  hydrOXYzine (VISTARIL) 25 MG capsule TAKE ONE CAPSULE BY MOUTH THREE TIMES A DAY AS NEEDED 02/13/19   [name], MD  Insulin  Syringe-Needle U-100 (B-D INS SYR ULTRAFINE 1CC/31G) 31G X 5/16" 1 ML MISC Use up to 5 times daily with Novolog and Lantus dosing 08/15/18   [name], MD  ketoconazole (NIZORAL) 2 % cream Apply 1 application topically daily. Apply to affected area on abdominal wall 02/14/19   Davonna Belling, MD  LANTUS 100 UNIT/ML injection INJECT 0.45 MILLILITERS (45 UNITS TOTAL) INTO THE SKIN TWO TIMES DAILY 01/08/19   [name], MD  liraglutide (VICTOZA) 18 MG/3ML SOPN INJECT 0.3 MLS (1.8 MG TOTAL) INTO THE SKIN DAILY 01/31/19   Zenia Resides, MD  lisinopril (PRINIVIL,ZESTRIL) 2.5 MG tablet TAKE ONE TABLET BY MOUTH TWICE A DAY 07/12/18   [name], MD  metFORMIN (GLUCOPHAGE) 1000 MG tablet TAKE ONE TABLET BY MOUTH TWICE A DAY WITH A MEAL 08/25/18   Darron Doom  S, MD  mometasone-formoterol (DULERA) 200-5 MCG/ACT AERO Inhale 2 puffs into the lungs 2 (two) times daily. 03/29/18   Zenia Resides, MD  Multiple Vitamin (MULTIVITAMIN) tablet Take 1 tablet by mouth daily.    [provider]  nitroGLYCERIN (NITROSTAT) 0.4 MG SL tablet PLACE 1 TABLET UNDER THE TONGUE EVERY 5 MINUTES FOR 3 DOSES AS NEEDED FOR CHEST PAIN *IF NO RELIEF, CALL 911* 08/14/18   [name], MD  NOVOLOG 100 UNIT/ML injection INJECT 50 UNITS INTO THE SKIN TWO TIMES A DAY BEFORE MEALS Patient taking differently: Inject 45 Units into the skin 2 (two) times daily before a meal.  07/11/18   [name], MD  Omega-3 Fatty Acids (FISH OIL PO) Take 1,000 mg by mouth 2 (two) times daily.     [provider]  prasugrel (EFFIENT) 10 MG TABS tablet Take 1 tablet (10 mg total) by mouth daily. 04/10/18   Almyra Deforest, PA  traMADol (ULTRAM) 50 MG tablet Take 1 tablet (50 mg total) by mouth every 6 (six) hours as needed. 08/14/18   [name], MD    Family History Family History  Problem Relation Age of Onset  . Heart attack Father 16  . Breast cancer Mother   . Hypertension Sister   . Hyperlipidemia Sister   .  Hyperlipidemia Sister   . Hypertension Sister   . Heart attack Brother   . Stroke Brother   . Diabetes Brother   . Diabetes Sister     Social History Social History   Tobacco Use  . Smoking status: Former Smoker    Packs/day: 1.00    Years: 42.00    Pack years: 42.00    Types: Cigarettes    Start date: 09/13/1975    Last attempt to quit: 01/25/2018    Years since quitting: 1.0  . Smokeless tobacco: Never Used  . Tobacco comment: Bupropion helping  Substance Use Topics  . Alcohol use: Yes    Alcohol/week: 6.0 standard drinks    Types: 6 Cans of beer per week    Comment: pt reports "I quit 66 days ago" (05/09/16)  . Drug use: No    Comment: Crack Cocaine (denies 05/09/16)     Allergies   Shellfish allergy   Review of Systems Review of Systems  Constitutional: Negative for chills and diaphoresis.  HENT: Negative for congestion.   Respiratory: Positive for cough. Negative for shortness of breath.   Cardiovascular: Negative for chest pain.  Gastrointestinal: Negative for abdominal pain.  Genitourinary: Negative for flank pain.  Musculoskeletal: Negative for back pain.  Skin: Positive for color change and wound.  Neurological: Negative for weakness.     Physical Exam Updated Vital Signs BP 124/74 (BP Location: Left Arm)   Pulse 77   Temp 98.2 F (36.8 C) (Oral)   Resp 20   Ht 5' 7" (1.702 m)   Wt 127 kg   SpO2 96%   BMI 43.85 kg/m   Physical Exam Vitals signs and nursing note reviewed.  HENT:     Head: Atraumatic.  Cardiovascular:     Rate and Rhythm: Regular rhythm.  Pulmonary:     Breath sounds: No wheezing, rhonchi or rales.  Abdominal:     Tenderness: There is no abdominal tenderness.  Musculoskeletal:     Right lower leg: Edema present.     Left lower leg: Edema present.  Skin:    Comments: Along the fold of his lower abdomen he has erythematous  somewhat weeping areas.  Anterior and superior to this has erythema with some induration.   Neurological:     General: No focal deficit present.     Mental Status: He is alert.      ED Treatments / Results  Labs (all labs ordered are listed, but only abnormal results are displayed) Labs Reviewed - No data to display  EKG None  Radiology No results found.  Procedures Procedures (including critical care time)  Medications Ordered in ED Medications - No data to display   Initial Impression / Assessment and Plan / ED Course  I have reviewed the triage vital signs and the nursing notes.  Pertinent labs & imaging results that were available during my care of the patient were reviewed by me and considered in my medical decision making (see chart for details).        Patient with what appears to be a candidal infection on his lower abdomen.  However more proximal to the area of candidiasis there is also some induration erythema.  Could be a superficial cellulitis on top of this to.  Will add antibiotics to the antifungal cream.  Sugar of 180 at home.  Will discharge home.  Lungs are clear on exam.  Final Clinical Impressions(s) / ED Diagnoses   Final diagnoses:  Candidiasis  Cellulitis of abdominal wall    ED Discharge Orders         Ordered    cephALEXin (KEFLEX) 500 MG capsule  4 times daily     02/14/19 0949    ketoconazole (NIZORAL) 2 % cream  Daily     02/14/19 0949           Davonna Belling, MD 02/14/19 [phone]

## 2019-02-18 ENCOUNTER — Other Ambulatory Visit: Payer: Self-pay | Admitting: Family Medicine

## 2019-02-18 DIAGNOSIS — M25512 Pain in left shoulder: Secondary | ICD-10-CM

## 2019-02-18 DIAGNOSIS — G8929 Other chronic pain: Secondary | ICD-10-CM

## 2019-02-25 ENCOUNTER — Telehealth (INDEPENDENT_AMBULATORY_CARE_PROVIDER_SITE_OTHER): Payer: Medicaid Other | Admitting: Family Medicine

## 2019-02-25 ENCOUNTER — Other Ambulatory Visit: Payer: Self-pay

## 2019-02-25 DIAGNOSIS — Z794 Long term (current) use of insulin: Secondary | ICD-10-CM | POA: Diagnosis not present

## 2019-02-25 DIAGNOSIS — E1159 Type 2 diabetes mellitus with other circulatory complications: Secondary | ICD-10-CM

## 2019-02-25 DIAGNOSIS — M545 Low back pain, unspecified: Secondary | ICD-10-CM

## 2019-02-25 DIAGNOSIS — F5101 Primary insomnia: Secondary | ICD-10-CM | POA: Insufficient documentation

## 2019-02-25 DIAGNOSIS — G8929 Other chronic pain: Secondary | ICD-10-CM

## 2019-02-25 MED ORDER — TRAMADOL HCL 50 MG PO TABS: 50.0000 mg | ORAL_TABLET | Freq: Four times a day (QID) | ORAL | 0 refills | Status: DC | PRN

## 2019-02-25 MED ORDER — HYDROXYZINE PAMOATE 25 MG PO CAPS: 25.0000 mg | ORAL_CAPSULE | Freq: Every day | ORAL | 0 refills | Status: DC

## 2019-02-25 NOTE — Assessment & Plan Note (Signed)
Continue diet and current meds--metformin, Victoza and Insulin

## 2019-02-25 NOTE — Progress Notes (Signed)
TELEHEALTH VIRTUAL VISIT ENCOUNTER NOTE  I connected with [name] [name] on 02/25/19 at 11:10 AM EDT by telephone at home and verified that I am speaking with the correct person using two identifiers.   I discussed the limitations, risks, security and privacy concerns of performing an evaluation and management service by telephone and the availability of in person appointments. I also discussed with the patient that there may be a patient responsible charge related to this service. The patient expressed understanding and agreed to proceed.   History:  [name] [name] is a 58 y.o. No obstetric history on file. male being evaluated today for med refill. He denies any fever chills. He is going out of town and needs a few meds refilled. Asks for Tramadol and Vistaril.       Past Medical History:  Diagnosis Date  . Alcoholism ([place])   . Anxiety   . Arthritis    "qwhere"  . Asthma   . CAD S/P percutaneous coronary angioplasty 01/18/12; 10/2013   a. pRCA 3.5 x 18 vision BMS - 4.2 mm; b. 2/'15: mCx 3.5 x 12  Rebel BMS (3.6-3.7 mm)  . Chronic back pain    "mid/lower" (08/01/2014)  . COPD (chronic obstructive pulmonary disease) ([place])    "I'm seeing COPD dr now; don't know if I've got it" (08/01/2014)  . Depression   . GERD (gastroesophageal reflux disease)   . Hyperlipidemia   . Hypertension   . Migraines    "once in awhile" (08/01/2014)  . Non-ST elevation myocardial infarction (NSTEMI) ([place]) 10/2013  . OSA on CPAP 01/19/2012  . Type II diabetes mellitus ([place])    Past Surgical History:  Procedure Laterality Date  . CARDIAC CATHETERIZATION  07/2014   Left Main: Short, large-caliber vessel. Widely patent. Bifurcates into the LAD and Circumflex. Angiographically normal.  . CORONARY ANGIOPLASTY WITH STENT PLACEMENT  01/2012; 11/07/2013; 08/01/2014   "1 + 2 + 1"   . CORONARY ANGIOPLASTY WITH STENT PLACEMENT  07/2014   Severe single-vessel disease involving the bifurcation of OM1 and OM 2 with 99%  in-stent restenosis in the bare-metal stent placed in the OM 2.  . LEFT HEART CATHETERIZATION WITH CORONARY ANGIOGRAM N/A 01/20/2012   Procedure: LEFT HEART CATHETERIZATION WITH CORONARY ANGIOGRAM;  Surgeon: Jettie Booze, MD;  Location: [facility] CATH LAB;  Service: Cardiovascular;  Laterality: N/A;  . LEFT HEART CATHETERIZATION WITH CORONARY ANGIOGRAM N/A 11/07/2013   Procedure: LEFT HEART CATHETERIZATION WITH CORONARY ANGIOGRAM;  Surgeon: Jettie Booze, MD;  Location: [facility] CATH LAB;  Service: Cardiovascular;  Laterality: N/A;  . LEFT HEART CATHETERIZATION WITH CORONARY ANGIOGRAM N/A 08/01/2014   Procedure: LEFT HEART CATHETERIZATION WITH CORONARY ANGIOGRAM;  Surgeon: Leonie Man, MD;  Location: [facility] CATH LAB;  Service: Cardiovascular;  Laterality: N/A;  . PERCUTANEOUS CORONARY STENT INTERVENTION (PCI-S)  01/20/2012   Procedure: PERCUTANEOUS CORONARY STENT INTERVENTION (PCI-S);  Surgeon: Jettie Booze, MD;  Location: [facility] CATH LAB;  Service: Cardiovascular;;  . PERCUTANEOUS CORONARY STENT INTERVENTION (PCI-S)  11/07/2013   Procedure: PERCUTANEOUS CORONARY STENT INTERVENTION (PCI-S);  Surgeon: Jettie Booze, MD;  Location: [facility] CATH LAB;  Service: Cardiovascular;;   The following portions of the patient's history were reviewed and updated as appropriate: allergies, current medications, past family history, past medical history, past social history, past surgical history and problem list.    Review of Systems:  Pertinent items noted in HPI and remainder of comprehensive ROS otherwise negative.  Physical Exam:   General:  Alert, oriented and  cooperative.   Mental Status: Normal mood and affect perceived. Normal judgment and thought content.  Physical exam deferred due to nature of the encounter   Assessment and Plan:     Problem List Items Addressed This Visit      Medium   DM type 2 (diabetes mellitus, type 2) ([place]) - Primary    Continue diet and current meds--metformin,  Victoza and Insulin        Unprioritized   Back pain    Refilled Tramadol--take sparingly      Relevant Medications   traMADol (ULTRAM) 50 MG tablet   Primary insomnia    Uses vistaril for this, working well.      Relevant Medications   hydrOXYzine (VISTARIL) 25 MG capsule           I discussed the assessment and treatment plan with the patient. The patient was provided an opportunity to ask questions and all were answered. The patient agreed with the plan and demonstrated an understanding of the instructions.   The patient was advised to call back or seek an in-person evaluation/go to the ED if the symptoms worsen or if the condition fails to improve as anticipated.  I provided 11 minutes of non-face-to-face time during this encounter.   [name], MD 02/25/2019 10:33 AM

## 2019-02-25 NOTE — Assessment & Plan Note (Signed)
Refilled Tramadol--take sparingly

## 2019-02-25 NOTE — Assessment & Plan Note (Signed)
Uses vistaril for this, working well.

## 2019-03-04 ENCOUNTER — Other Ambulatory Visit: Payer: Self-pay | Admitting: Family Medicine

## 2019-03-04 DIAGNOSIS — Z794 Long term (current) use of insulin: Secondary | ICD-10-CM

## 2019-03-04 DIAGNOSIS — E1159 Type 2 diabetes mellitus with other circulatory complications: Secondary | ICD-10-CM

## 2019-03-06 ENCOUNTER — Other Ambulatory Visit: Payer: Self-pay | Admitting: Family Medicine

## 2019-03-06 DIAGNOSIS — G8929 Other chronic pain: Secondary | ICD-10-CM

## 2019-03-06 DIAGNOSIS — M25512 Pain in left shoulder: Secondary | ICD-10-CM

## 2019-03-12 ENCOUNTER — Other Ambulatory Visit: Payer: Self-pay | Admitting: Family Medicine

## 2019-03-12 DIAGNOSIS — Z720 Tobacco use: Secondary | ICD-10-CM

## 2019-03-15 ENCOUNTER — Other Ambulatory Visit: Payer: Self-pay | Admitting: Family Medicine

## 2019-03-15 DIAGNOSIS — I10 Essential (primary) hypertension: Secondary | ICD-10-CM

## 2019-03-15 DIAGNOSIS — Z9861 Coronary angioplasty status: Secondary | ICD-10-CM

## 2019-03-15 DIAGNOSIS — I2510 Atherosclerotic heart disease of native coronary artery without angina pectoris: Secondary | ICD-10-CM

## 2019-03-18 ENCOUNTER — Other Ambulatory Visit: Payer: Self-pay | Admitting: Family Medicine

## 2019-03-18 DIAGNOSIS — I214 Non-ST elevation (NSTEMI) myocardial infarction: Secondary | ICD-10-CM

## 2019-03-18 DIAGNOSIS — I10 Essential (primary) hypertension: Secondary | ICD-10-CM

## 2019-03-26 LAB — HM DIABETES EYE EXAM

## 2019-03-28 ENCOUNTER — Other Ambulatory Visit: Payer: Self-pay

## 2019-03-28 ENCOUNTER — Telehealth (INDEPENDENT_AMBULATORY_CARE_PROVIDER_SITE_OTHER): Payer: Medicaid Other | Admitting: Pharmacist

## 2019-03-28 DIAGNOSIS — E1159 Type 2 diabetes mellitus with other circulatory complications: Secondary | ICD-10-CM | POA: Diagnosis present

## 2019-03-28 DIAGNOSIS — Z794 Long term (current) use of insulin: Secondary | ICD-10-CM

## 2019-03-28 NOTE — Assessment & Plan Note (Signed)
Patient reports good control of blood glucose with readings of 95-160 with majority of readings.   He complained that the hot weather is making exercise difficult.   He has been focused on his diet and is trying to control portions.   Due to his fear of running "too low" and his periodic "skipping insulin doses", we decided to decrease his novolog insulin dose from 50 to 45 units twice daily.  All other DM agents unchanged.

## 2019-03-28 NOTE — Progress Notes (Signed)
Patient seen and assessed virtually by pharmacy staff.  Agree with documentation and plan.

## 2019-03-28 NOTE — Progress Notes (Signed)
Phone Call visit follow up for Diabetes and History of Tobacco Abuse  Patient reports good control of blood glucose with readings of 95-160 with majority of readings.   He complained that the hot weather is making exercise difficult.   He has been focused on his diet and is trying to control portions.   Due to his fear of running "too low" and his periodic "skipping insulin doses", we decided to decrease his novolog insulin dose from 50 to 45 units twice daily.  All other DM agents unchanged.    We talked at length regarding the complete cessation of mint and cough drops for use for "cravings" as he is now >  1 year since cessation of smoking.   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  Denied any plan for eye procedures (laser) and a simple plan for 1 year follow-up.   Total time in counseling:  14 minutes.

## 2019-03-28 NOTE — Patient Instructions (Addendum)
Reduce Novolog from 50 BID to 45 BID prior to meals.   Next follow up with PCP, Dr. Kennon Rounds, likely in September.    PRN visit with Pharmacy clinic in early fall anticipated.

## 2019-04-01 ENCOUNTER — Encounter: Payer: Self-pay | Admitting: Podiatry

## 2019-04-01 ENCOUNTER — Ambulatory Visit: Payer: Medicaid Other | Admitting: Podiatry

## 2019-04-01 ENCOUNTER — Other Ambulatory Visit: Payer: Self-pay

## 2019-04-01 DIAGNOSIS — M79674 Pain in right toe(s): Secondary | ICD-10-CM

## 2019-04-01 DIAGNOSIS — E119 Type 2 diabetes mellitus without complications: Secondary | ICD-10-CM

## 2019-04-01 DIAGNOSIS — Z794 Long term (current) use of insulin: Secondary | ICD-10-CM

## 2019-04-01 DIAGNOSIS — B351 Tinea unguium: Secondary | ICD-10-CM | POA: Diagnosis not present

## 2019-04-01 DIAGNOSIS — M79675 Pain in left toe(s): Secondary | ICD-10-CM

## 2019-04-01 NOTE — Progress Notes (Signed)
Subjective:  [name] [name] presents to clinic today with cc of  painful, thick, discolored, elongated toenails 1-5 b/l that become tender and cannot cut because of thickness. Pain is aggravated when wearing enclosed shoe gear.  He voices no new pedal concerns on today's visit.   [name], MD is his PCP.    Current Outpatient Medications:  .  ACCU-CHEK FASTCLIX LANCETS MISC, 1 Units by Percutaneous route 4 (four) times daily., Disp: 100 each, Rfl: 12 .  albuterol (PROVENTIL HFA) 108 (90 Base) MCG/ACT inhaler, INHALE 2 PUFFS INTO THE LUNGS EVERY 6 HOURS AS NEEDED FOR WHEEZING OR FOR SHORTNESS OF BREATH, Disp: 6.7 g, Rfl: 1 .  aspirin 81 MG chewable tablet, Chew 1 tablet (81 mg total) by mouth daily., Disp: 180 tablet, Rfl: 2 .  atorvastatin (LIPITOR) 80 MG tablet, TAKE ONE TABLET BY MOUTH DAILY, Disp: 90 tablet, Rfl: 3 .  BD ULTRA-FINE PEN NEEDLES 29G X 12.7MM MISC, USE TO WITH VICTOZA PEN, INJECTION ONCE DAILY., Disp: 100 each, Rfl: 9 .  Blood Glucose Monitoring Suppl (ACCU-CHEK AVIVA PLUS) w/Device KIT, CHECK BLOOD GLUCOSE FASTING AND 2 HOURS AFTER BREAKFAST, LUNCH AND DINNER, Disp: 1 kit, Rfl: 0 .  buPROPion (WELLBUTRIN XL) 150 MG 24 hr tablet, TAKE ONE TABLET BY MOUTH DAILY, Disp: 30 tablet, Rfl: 0 .  carvedilol (COREG) 3.125 MG tablet, TAKE ONE TABLET BY MOUTH TWICE A DAY WITH MEALS, Disp: 60 tablet, Rfl: 0 .  carvedilol (COREG) 6.25 MG tablet, Take 1 tablet (6.25 mg total) by mouth 2 (two) times daily with a meal., Disp: 60 tablet, Rfl: 1 .  celecoxib (CELEBREX) 200 MG capsule, TAKE 1 CAPSULE BY MOUTH TWICE DAILY, Disp: 30 capsule, Rfl: 0 .  ciprofloxacin-dexamethasone (CIPRODEX) OTIC suspension, Place 4 drops into the left ear 2 (two) times daily., Disp: , Rfl:  .  citalopram (CELEXA) 20 MG tablet, Take 1 tablet (20 mg total) by mouth daily. (Patient not taking: Reported on 01/31/2019), Disp: 30 tablet, Rfl: 0 .  empagliflozin (JARDIANCE) 25 MG TABS tablet, Take 25 mg by mouth daily.,  Disp: 90 tablet, Rfl: 3 .  glucose blood (ACCU-CHEK AVIVA PLUS) test strip, USE ONE STRIP TO TEST FOUR TIMES A DAY, Disp: 300 each, Rfl: 11 .  hydrOXYzine (VISTARIL) 25 MG capsule, Take 1 capsule (25 mg total) by mouth at bedtime., Disp: 30 capsule, Rfl: 0 .  Insulin Syringe-Needle U-100 (B-D INS SYR ULTRAFINE 1CC/31G) 31G X 5/16" 1 ML MISC, Use up to 5 times daily with Novolog and Lantus dosing, Disp: 100 each, Rfl: 3 .  ketoconazole (NIZORAL) 2 % cream, Apply 1 application topically daily. Apply to affected area on abdominal wall, Disp: 15 g, Rfl: 0 .  LANTUS 100 UNIT/ML injection, INJECT 45 UNITS INTO THE SKIN TWO TIMES A DAY, Disp: 10 mL, Rfl: 3 .  liraglutide (VICTOZA) 18 MG/3ML SOPN, INJECT 0.3 MLS (1.8 MG TOTAL) INTO THE SKIN DAILY, Disp: 9 pen, Rfl: 3 .  lisinopril (PRINIVIL,ZESTRIL) 2.5 MG tablet, TAKE ONE TABLET BY MOUTH TWICE A DAY, Disp: 180 tablet, Rfl: 2 .  metFORMIN (GLUCOPHAGE) 1000 MG tablet, TAKE ONE TABLET BY MOUTH TWICE A DAY WITH A MEAL, Disp: 180 tablet, Rfl: 10 .  mometasone-formoterol (DULERA) 200-5 MCG/ACT AERO, Inhale 2 puffs into the lungs 2 (two) times daily., Disp: 1 Inhaler, Rfl: 11 .  Multiple Vitamin (MULTIVITAMIN) tablet, Take 1 tablet by mouth daily., Disp: , Rfl:  .  nitroGLYCERIN (NITROSTAT) 0.4 MG SL tablet, PLACE 1 TABLET UNDER THE  TONGUE EVERY 5 MINUTES FOR 3 DOSES AS NEEDED FOR CHEST PAIN *IF NO RELIEF, CALL 911*, Disp: 100 tablet, Rfl: 1 .  NOVOLOG 100 UNIT/ML injection, INJECT 50 UNITS INTO THE SKIN TWO TIMES A DAY BEFORE MEALS, Disp: 10 mL, Rfl: 2 .  Omega-3 Fatty Acids (FISH OIL PO), Take 1,000 mg by mouth 2 (two) times daily. , Disp: , Rfl:  .  prasugrel (EFFIENT) 10 MG TABS tablet, Take 1 tablet (10 mg total) by mouth daily., Disp: 90 tablet, Rfl: 3 .  traMADol (ULTRAM) 50 MG tablet, Take 1 tablet (50 mg total) by mouth every 6 (six) hours as needed., Disp: 20 tablet, Rfl: 0   Allergies  Allergen Reactions  . Shellfish Allergy Nausea And Vomiting     OYSTERS     Objective:  Physical Examination:  Vascular Examination: Capillary refill time immediate x 10 digits.  Palpable DP/PT pulses b/l.  Digital hair present b/l.  No edema noted b/l.  Skin temperature gradient WNL b/l.  Dermatological Examination: Skin with normal turgor, texture and tone b/l.  No open wounds b/l.  No interdigital macerations noted b/l.  Elongated, thick, discolored brittle toenails with subungual debris and pain on dorsal palpation of nailbeds 1-5 b/l.  Musculoskeletal Examination: Muscle strength 5/5 to all muscle groups b/l.  No pain, crepitus or joint discomfort with active/passive ROM.  Neurological Examination: Sensation intact 5/5 b/l with 10 gram monofilament.  Vibratory sensation intact b/l.  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 Toenails 1-5 b/l were debrided in length and girth without iatrogenic laceration. 2.  Continue soft, supportive shoe gear daily. 3.  Report any pedal injuries to medical professional. 4.  Follow up 3 months. 5.  Patient/POA to call should there be a question/concern in there interim.

## 2019-04-01 NOTE — Patient Instructions (Signed)
Diabetes Mellitus and Foot Care Foot care is an important part of your health, especially when you have diabetes. Diabetes may cause you to have problems because of poor blood flow (circulation) to your feet and legs, which can cause your skin to:  Become thinner and drier.  Break more easily.  Heal more slowly.  Peel and crack. You may also have nerve damage (neuropathy) in your legs and feet, causing decreased feeling in them. This means that you may not notice minor injuries to your feet that could lead to more serious problems. Noticing and addressing any potential problems early is the best way to prevent future foot problems. How to care for your feet Foot hygiene  Wash your feet daily with warm water and mild soap. Do not use hot water. Then, pat your feet and the areas between your toes until they are completely dry. Do not soak your feet as this can dry your skin.  Trim your toenails straight across. Do not dig under them or around the cuticle. File the edges of your nails with an emery board or nail file.  Apply a moisturizing lotion or petroleum jelly to the skin on your feet and to dry, brittle toenails. Use lotion that does not contain alcohol and is unscented. Do not apply lotion between your toes. Shoes and socks  Wear clean socks or stockings every day. Make sure they are not too tight. Do not wear knee-high stockings since they may decrease blood flow to your legs.  Wear shoes that fit properly and have enough cushioning. Always look in your shoes before you put them on to be sure there are no objects inside.  To break in new shoes, wear them for just a few hours a day. This prevents injuries on your feet. Wounds, scrapes, corns, and calluses  Check your feet daily for blisters, cuts, bruises, sores, and redness. If you cannot see the bottom of your feet, use a mirror or ask someone for help.  Do not cut corns or calluses or try to remove them with medicine.  If you  find a minor scrape, cut, or break in the skin on your feet, keep it and the skin around it clean and dry. You may clean these areas with mild soap and water. Do not clean the area with peroxide, alcohol, or iodine.  If you have a wound, scrape, corn, or callus on your foot, look at it several times a day to make sure it is healing and not infected. Check for: ? Redness, swelling, or pain. ? Fluid or blood. ? Warmth. ? Pus or a bad smell. General instructions  Do not cross your legs. This may decrease blood flow to your feet.  Do not use heating pads or hot water bottles on your feet. They may burn your skin. If you have lost feeling in your feet or legs, you may not know this is happening until it is too late.  Protect your feet from hot and cold by wearing shoes, such as at the beach or on hot pavement.  Schedule a complete foot exam at least once a year (annually) or more often if you have foot problems. If you have foot problems, report any cuts, sores, or bruises to your health care provider immediately. Contact a health care provider if:  You have a medical condition that increases your risk of infection and you have any cuts, sores, or bruises on your feet.  You have an injury that is not   healing.  You have redness on your legs or feet.  You feel burning or tingling in your legs or feet.  You have pain or cramps in your legs and feet.  Your legs or feet are numb.  Your feet always feel cold.  You have pain around a toenail. Get help right away if:  You have a wound, scrape, corn, or callus on your foot and: ? You have pain, swelling, or redness that gets worse. ? You have fluid or blood coming from the wound, scrape, corn, or callus. ? Your wound, scrape, corn, or callus feels warm to the touch. ? You have pus or a bad smell coming from the wound, scrape, corn, or callus. ? You have a fever. ? You have a red line going up your leg. Summary  Check your feet every day  for cuts, sores, red spots, swelling, and blisters.  Moisturize feet and legs daily.  Wear shoes that fit properly and have enough cushioning.  If you have foot problems, report any cuts, sores, or bruises to your health care provider immediately.  Schedule a complete foot exam at least once a year (annually) or more often if you have foot problems. This information is not intended to replace advice given to you by your health care provider. Make sure you discuss any questions you have with your health care provider. Document Released: 08/26/2000 Document Revised: 10/11/2017 Document Reviewed: 09/30/2016 Elsevier Patient Education  2020 [employer]   Onychomycosis/Fungal Toenails  WHAT IS IT? An infection that lies within the keratin of your nail plate that is caused by a fungus.  WHY ME? Fungal infections affect all ages, sexes, races, and creeds.  There may be many factors that predispose you to a fungal infection such as age, coexisting medical conditions such as diabetes, or an autoimmune disease; stress, medications, fatigue, genetics, etc.  Bottom line: fungus thrives in a warm, moist environment and your shoes offer such a location.  IS IT CONTAGIOUS? Theoretically, yes.  You do not want to share shoes, nail clippers or files with someone who has fungal toenails.  Walking around barefoot in the same room or sleeping in the same bed is unlikely to transfer the organism.  It is important to realize, however, that fungus can spread easily from one nail to the next on the same foot.  HOW DO WE TREAT THIS?  There are several ways to treat this condition.  Treatment may depend on many factors such as age, medications, pregnancy, liver and kidney conditions, etc.  It is best to ask your doctor which options are available to you.  1. No treatment.   Unlike many other medical concerns, you can live with this condition.  However for many people this can be a painful condition and may lead to  ingrown toenails or a bacterial infection.  It is recommended that you keep the nails cut short to help reduce the amount of fungal nail. 2. Topical treatment.  These range from herbal remedies to prescription strength nail lacquers.  About 40-50% effective, topicals require twice daily application for approximately 9 to 12 months or until an entirely new nail has grown out.  The most effective topicals are medical grade medications available through physicians offices. 3. Oral antifungal medications.  With an 80-90% cure rate, the most common oral medication requires 3 to 4 months of therapy and stays in your system for a year as the new nail grows out.  Oral antifungal medications do require   blood work to make sure it is a safe drug for you.  A liver function panel will be performed prior to starting the medication and after the first month of treatment.  It is important to have the blood work performed to avoid any harmful side effects.  In general, this medication safe but blood work is required. 4. Laser Therapy.  This treatment is performed by applying a specialized laser to the affected nail plate.  This therapy is noninvasive, fast, and non-painful.  It is not covered by insurance and is therefore, out of pocket.  The results have been very good with a 80-95% cure rate.  [facility] is the only practice in the area to offer this therapy. 5. Permanent Nail Avulsion.  Removing the entire nail so that a new nail will not grow back. 

## 2019-04-07 ENCOUNTER — Other Ambulatory Visit: Payer: Self-pay | Admitting: Family Medicine

## 2019-04-07 DIAGNOSIS — E785 Hyperlipidemia, unspecified: Secondary | ICD-10-CM

## 2019-04-07 DIAGNOSIS — I2510 Atherosclerotic heart disease of native coronary artery without angina pectoris: Secondary | ICD-10-CM

## 2019-04-09 ENCOUNTER — Other Ambulatory Visit: Payer: Self-pay | Admitting: Family Medicine

## 2019-04-09 DIAGNOSIS — Z794 Long term (current) use of insulin: Secondary | ICD-10-CM

## 2019-04-09 DIAGNOSIS — E1159 Type 2 diabetes mellitus with other circulatory complications: Secondary | ICD-10-CM

## 2019-04-10 ENCOUNTER — Other Ambulatory Visit: Payer: Self-pay | Admitting: Family Medicine

## 2019-04-10 DIAGNOSIS — E1159 Type 2 diabetes mellitus with other circulatory complications: Secondary | ICD-10-CM

## 2019-04-11 ENCOUNTER — Ambulatory Visit: Payer: Medicaid Other | Admitting: Physician Assistant

## 2019-04-11 ENCOUNTER — Other Ambulatory Visit: Payer: Self-pay

## 2019-04-11 ENCOUNTER — Encounter: Payer: Self-pay | Admitting: Physician Assistant

## 2019-04-11 VITALS — BP 123/78 | HR 76 | Temp 96.1°F | Ht 67.0 in | Wt 293.0 lb

## 2019-04-11 DIAGNOSIS — I2510 Atherosclerotic heart disease of native coronary artery without angina pectoris: Secondary | ICD-10-CM

## 2019-04-11 DIAGNOSIS — I10 Essential (primary) hypertension: Secondary | ICD-10-CM

## 2019-04-11 DIAGNOSIS — E119 Type 2 diabetes mellitus without complications: Secondary | ICD-10-CM | POA: Diagnosis not present

## 2019-04-11 DIAGNOSIS — E782 Mixed hyperlipidemia: Secondary | ICD-10-CM

## 2019-04-11 NOTE — Patient Instructions (Addendum)
Medication Instructions:  Your physician recommends that you continue on your current medications as directed. Please refer to the Current Medication list given to you today.  If you need a refill on your cardiac medications before your next appointment, please call your pharmacy.   Lab work: You will need to return to the office in 1 month for labs (blood work) drawn:  Fasting Lipid Panel-DO NOT eat or drink past midnight  Liver Function Test  If you have labs (blood work) drawn today and your tests are completely normal, you will receive your results only by: Marland Kitchen MyChart Message (if you have MyChart) OR . A paper copy in the mail If you have any lab test that is abnormal or we need to change your treatment, we will call you to review the results.  Testing/Procedures: NONE ordered at this time of appointment   Follow-Up: At [facility], you and your health needs are our priority.  As part of our continuing mission to provide you with exceptional heart care, we have created designated Provider Care Teams.  These Care Teams include your primary Cardiologist (physician) and Advanced Practice Providers (APPs -  Physician Assistants and Nurse Practitioners) who all work together to provide you with the care you need, when you need it. You will need a follow up appointment in 6 months.  Please call our office 2 months in advance to schedule this appointment.  You may see Peter Martinique, MD or one of the following Advanced Practice Providers on your designated Care Team: [place], [place] . Fabian Sharp, PA-C  Any Other Special Instructions Will Be Listed Below (If Applicable).

## 2019-04-11 NOTE — Progress Notes (Signed)
Cardiology Office Note    Date:  04/13/2019   ID:  [name] [name], DOB [birth year], MRN [id number]  PCP:  [name], MD  Cardiologist:  [name]  Chief Complaint  Patient presents with  . Follow-up    seen for [name].     History of Present Illness:  [name] [name] is a 58 y.o. male with PMH of CAD, COPD, HTN, HLD, DM II and OSA on CPAP.  Patient had stenting of RCA in May 2013.  He was admitted with an STEMI in February 2015 and found to have severe disease in left circumflex and the OM 2 treated with bare-metal stent.  Myoview October 2015 was intermediate risk.  He had repeat cardiac catheter November 2015 and found to have severe single-vessel CAD involving the bifurcation of OM1 and OM 2 with 99% in-stent restenosis in the OM 2 bare-metal stent, he was treated with stenting spanning the proximal left circumflex across OM1.  Cardiac catheterization also revealed widely patent RCA and proximal left circumflex.  He was continued on aspirin and Effient.  Patient was last seen by [name] in January 2020 at which time he was doing well.  He presents today for cardiology office visit.  He is doing well from cardiology perspective, he denies any exertional chest pain or shortness of breath.  He has no obvious lower extremity edema, orthopnea or PND.  I will continue him on the current medication.   Past Medical History:  Diagnosis Date  . Alcoholism ([place])   . Anxiety   . Arthritis    "qwhere"  . Asthma   . CAD S/P percutaneous coronary angioplasty 01/18/12; 10/2013   a. pRCA 3.5 x 18 vision BMS - 4.2 mm; b. 2/'15: mCx 3.5 x 12  Rebel BMS (3.6-3.7 mm)  . Chronic back pain    "mid/lower" (08/01/2014)  . COPD (chronic obstructive pulmonary disease) ([place])    "I'm seeing COPD dr now; don't know if I've got it" (08/01/2014)  . Depression   . GERD (gastroesophageal reflux disease)   . Hyperlipidemia   . Hypertension   . Migraines    "once in awhile" (08/01/2014)  . Non-ST elevation  myocardial infarction (NSTEMI) ([place]) 10/2013  . OSA on CPAP 01/19/2012  . Type II diabetes mellitus ([name] [name])     Past Surgical History:  Procedure Laterality Date  . CARDIAC CATHETERIZATION  07/2014   Left Main: Short, large-caliber vessel. Widely patent. Bifurcates into the LAD and Circumflex. Angiographically normal.  . CORONARY ANGIOPLASTY WITH STENT PLACEMENT  01/2012; 11/07/2013; 08/01/2014   "1 + 2 + 1"   . CORONARY ANGIOPLASTY WITH STENT PLACEMENT  07/2014   Severe single-vessel disease involving the bifurcation of OM1 and OM 2 with 99% in-stent restenosis in the bare-metal stent placed in the OM 2.  . LEFT HEART CATHETERIZATION WITH CORONARY ANGIOGRAM N/A 01/20/2012   Procedure: LEFT HEART CATHETERIZATION WITH CORONARY ANGIOGRAM;  Surgeon: Jettie Booze, MD;  Location: [facility] CATH LAB;  Service: Cardiovascular;  Laterality: N/A;  . LEFT HEART CATHETERIZATION WITH CORONARY ANGIOGRAM N/A 11/07/2013   Procedure: LEFT HEART CATHETERIZATION WITH CORONARY ANGIOGRAM;  Surgeon: Jettie Booze, MD;  Location: [facility] CATH LAB;  Service: Cardiovascular;  Laterality: N/A;  . LEFT HEART CATHETERIZATION WITH CORONARY ANGIOGRAM N/A 08/01/2014   Procedure: LEFT HEART CATHETERIZATION WITH CORONARY ANGIOGRAM;  Surgeon: Leonie Man, MD;  Location: [facility] CATH LAB;  Service: Cardiovascular;  Laterality: N/A;  . PERCUTANEOUS CORONARY STENT INTERVENTION (PCI-S)  01/20/2012  Procedure: PERCUTANEOUS CORONARY STENT INTERVENTION (PCI-S);  Surgeon: Jettie Booze, MD;  Location: [facility] CATH LAB;  Service: Cardiovascular;;  . PERCUTANEOUS CORONARY STENT INTERVENTION (PCI-S)  11/07/2013   Procedure: PERCUTANEOUS CORONARY STENT INTERVENTION (PCI-S);  Surgeon: Jettie Booze, MD;  Location: [facility] CATH LAB;  Service: Cardiovascular;;    Current Medications: Outpatient Medications Prior to Visit  Medication Sig Dispense Refill  . ACCU-CHEK FASTCLIX LANCETS MISC 1 Units by Percutaneous route 4 (four) times daily. 100  each 12  . albuterol (PROVENTIL HFA) 108 (90 Base) MCG/ACT inhaler INHALE 2 PUFFS INTO THE LUNGS EVERY 6 HOURS AS NEEDED FOR WHEEZING OR FOR SHORTNESS OF BREATH 6.7 g 1  . aspirin 81 MG chewable tablet Chew 1 tablet (81 mg total) by mouth daily. 180 tablet 2  . atorvastatin (LIPITOR) 80 MG tablet TAKE ONE TABLET BY MOUTH DAILY 90 tablet 2  . BD ULTRA-FINE PEN NEEDLES 29G X 12.7MM MISC USE TO WITH VICTOZA PEN, INJECTION ONCE DAILY. 100 each 9  . Blood Glucose Monitoring Suppl (ACCU-CHEK AVIVA PLUS) w/Device KIT CHECK BLOOD GLUCOSE FASTING AND 2 HOURS AFTER BREAKFAST, LUNCH AND DINNER 1 kit 0  . carvedilol (COREG) 6.25 MG tablet Take 1 tablet (6.25 mg total) by mouth 2 (two) times daily with a meal. 60 tablet 1  . celecoxib (CELEBREX) 200 MG capsule TAKE 1 CAPSULE BY MOUTH TWICE DAILY 30 capsule 0  . ciprofloxacin-dexamethasone (CIPRODEX) OTIC suspension Place 4 drops into the left ear 2 (two) times daily.    . citalopram (CELEXA) 20 MG tablet Take 1 tablet (20 mg total) by mouth daily. 30 tablet 0  . empagliflozin (JARDIANCE) 25 MG TABS tablet Take 25 mg by mouth daily. 90 tablet 3  . glucose blood (ACCU-CHEK AVIVA PLUS) test strip USE ONE STRIP TO TEST FOUR TIMES A DAY 300 each 11  . hydrOXYzine (VISTARIL) 25 MG capsule Take 1 capsule (25 mg total) by mouth at bedtime. 30 capsule 0  . Insulin Syringe-Needle U-100 (B-D INS SYR ULTRAFINE 1CC/31G) 31G X 5/16" 1 ML MISC Use up to 5 times daily with Novolog and Lantus dosing 100 each 3  . ketoconazole (NIZORAL) 2 % cream Apply 1 application topically daily. Apply to affected area on abdominal wall 15 g 0  . LANTUS 100 UNIT/ML injection INJECT 45 UNITS INTO THE SKIN TWO TIMES A DAY 10 mL 2  . liraglutide (VICTOZA) 18 MG/3ML SOPN INJECT 0.3 MLS (1.8 MG TOTAL) INTO THE SKIN DAILY 9 pen 3  . lisinopril (PRINIVIL,ZESTRIL) 2.5 MG tablet TAKE ONE TABLET BY MOUTH TWICE A DAY 180 tablet 2  . metFORMIN (GLUCOPHAGE) 1000 MG tablet TAKE ONE TABLET BY MOUTH TWICE A  DAY WITH A MEAL 180 tablet 10  . mometasone-formoterol (DULERA) 200-5 MCG/ACT AERO Inhale 2 puffs into the lungs 2 (two) times daily. 1 Inhaler 11  . Multiple Vitamin (MULTIVITAMIN) tablet Take 1 tablet by mouth daily.    . nitroGLYCERIN (NITROSTAT) 0.4 MG SL tablet PLACE 1 TABLET UNDER THE TONGUE EVERY 5 MINUTES FOR 3 DOSES AS NEEDED FOR CHEST PAIN *IF NO RELIEF, CALL 911* 100 tablet 1  . NOVOLOG 100 UNIT/ML injection INJECT 50 UNITS INTO THE SKIN TWO TIMES A DAY BEFORE MEALS 10 mL 1  . Omega-3 Fatty Acids (FISH OIL PO) Take 1,000 mg by mouth 2 (two) times daily.     . prasugrel (EFFIENT) 10 MG TABS tablet Take 1 tablet (10 mg total) by mouth daily. 90 tablet 3  . traMADol (ULTRAM) 50  MG tablet Take 1 tablet (50 mg total) by mouth every 6 (six) hours as needed. 20 tablet 0  . buPROPion (WELLBUTRIN XL) 150 MG 24 hr tablet TAKE ONE TABLET BY MOUTH DAILY 30 tablet 0  . carvedilol (COREG) 3.125 MG tablet TAKE ONE TABLET BY MOUTH TWICE A DAY WITH MEALS 60 tablet 0   No facility-administered medications prior to visit.      Allergies:   Shellfish allergy   Social History   Socioeconomic History  . Marital status: Single    Spouse name: Not on file  . Number of children: 0  . Years of education: 9  . Highest education level: Not on file  Occupational History  . Occupation: Unemployed    Comment: Used to work for [employer]  . Occupation: Ship broker, Hotel manager [place]  . Financial resource strain: Not on file  . Food insecurity    Worry: Not on file    Inability: Not on file  . Transportation needs    Medical: Not on file    Non-medical: Not on file  Tobacco Use  . Smoking status: Former Smoker    Packs/day: 1.00    Years: 42.00    Pack years: 42.00    Types: Cigarettes    Start date: 09/13/1975    Quit date: 01/25/2018    Years since quitting: 1.2  . Smokeless tobacco: Never Used  . Tobacco comment: Bupropion helping  Substance and Sexual Activity  .  Alcohol use: Yes    Alcohol/week: 6.0 standard drinks    Types: 6 Cans of beer per week    Comment: pt reports "I quit 66 days ago" (05/09/16)  . Drug use: No    Comment: Crack Cocaine (denies 05/09/16)  . Sexual activity: Not Currently  Lifestyle  . Physical activity    Days per week: Not on file    Minutes per session: Not on file  . Stress: Not on file  Relationships  . Social Herbalist on phone: Not on file    Gets together: Not on file    Attends religious service: Not on file    Active member of club or organization: Not on file    Attends meetings of clubs or organizations: Not on file    Relationship status: Not on file  Other Topics Concern  . Not on file  Social History Narrative   Single.  Lives with a roommate.  Ambulates independently.     Family History:  The patient's family history includes Breast cancer in his mother; Diabetes in his brother and sister; Heart attack in his brother; Heart attack (age of onset: 39) in his father; Hyperlipidemia in his sister and sister; Hypertension in his sister and sister; Stroke in his brother.   ROS:   Please see the history of present illness.    ROS All other systems reviewed and are negative.   PHYSICAL EXAM:   VS:  BP 123/78   Pulse 76   Temp (!) 96.1 F (35.6 C)   Ht _0  (1.702 m)   Wt 293 lb (132.9 kg)   BMI 45.89 kg/m    GEN: Well nourished, well developed, in no acute distress  HEENT: normal  Neck: no JVD, carotid bruits, or masses Cardiac: RRR; no murmurs, rubs, or gallops,no edema  Respiratory:  clear to auscultation bilaterally, normal work of breathing GI: soft, nontender, nondistended, + BS MS: no deformity or atrophy  Skin: warm  and dry, no rash Neuro:  Alert and Oriented x 3, Strength and sensation are intact Psych: euthymic mood, full affect  Wt Readings from Last 3 Encounters:  04/11/19 293 lb (132.9 kg)  02/14/19 280 lb (127 kg)  12/16/18 285 lb (129.3 kg)      Studies/Labs  Reviewed:   EKG:  EKG is not ordered today.   Recent Labs: No results found for requested labs within last 8760 hours.   Lipid Panel    Component Value Date/Time   CHOL 118 04/10/2018 1507   TRIG 150 (H) 04/10/2018 1507   HDL 35 (L) 04/10/2018 1507   CHOLHDL 3.5 04/03/2017 1451   CHOLHDL 3.7 03/29/2016 1227   VLDL 53 (H) 03/29/2016 1227   LDLCALC 53 04/10/2018 1507    Additional studies/ records that were reviewed today include:   Cath 08/01/2014 FINDINGS:  Hemodynamics:   Central Aortic Pressure / Mean: 117/65/55 mmHg  Left Ventricular Pressure / LVEDP: 116/11/18 mmHg  Left Ventriculography:  EF: 55-65 %  Wall Motion: Normal  Coronary Anatomy:  Dominance: Right  Left Main: Short, large-caliber vessel. Widely patent. Bifurcates into the LAD and Circumflex. Angiographically normal. LAD: Large-caliber vessel that wraps the apex. Diffuse mild luminal irregularities but no significant obstruction. One major proximal diagonal branch and several smaller diagonal branches. No significant disease.  Left Circumflex: Large-caliber, nondominant vessel that gives off a small caliber AV groove branch. Widely patent stent in this segment. Just distal to this the vessel bifurcates into OM1 and OM 2. O2 is relatively free of disease but OM1 the bifurcation to the stent has roughly 60% stenosis and there is 99% in-stent restenosis of the bare-metal stent in OM 2. Distally OM 2 has relatively no significant disease beyond the stent.   RCA: Large-caliber, dominant vessel with a widely patent proximal stent that has maybe 5-10% in-stent restenosis. The vessel then has a roughly 30-40% focal stenosis just after the crux before bifurcates distally into the Right Posterior Descending Artery (RPDA) and the Right Posterior AV Groove Branch (RPAV). Otherwise mild luminal irregularities.  RPDA: Moderate caliber vessel which does not reach all the way to the apex. Mild diffuse luminal  irregularities   RPL Sysytem:The RPAV begins as a moderate caliber vessel that gives off one major and one minor posterior lateral branch.  After reviewing the initial angiography, the culprit lesion was thought to be the severe in-stent restenosis in the OM 2 bare-metal stent with progression of disease in the intervening segment between the circumflex and OM 2 stent at the OM1 bifurcation.  Preparation were made to proceed with PCI on this lesion.  Percutaneous Coronary Intervention:   Guide: 6 Fr   CLS 3.0 Guidewire: BMW for OM 2 and Prowater for OM1 Predilation Balloon: Euphora 2.5 mm x 15 mm;  ? 10 Atm x 20 Sec, 12 Atm x 25 Sec - despite multiple attempts inflating the proximal edge of the OM 2 stent, the balloon continued to watermelon seed either proximally or distally. Therefore the plan was made to use a cutting balloon. Predilation Balloon: FlexTome Cutting Balloon 2.5 mm x 10 mm;  ? 12 Atm x 40 Sec - at the bifurcation , 8 Atm x 30 Sec for in-stent restenosis Stent: Xience Alpine DES 3.25 mm x 28 mm;  stents band from the distal portion of the circumflex stent through the bare-metal stents in OM2 just distally. ? 16 Atm x 30 Sec -- distal diameter 3.45 mm Post-dilation Balloon: Earl Euphora 3.5 mm  x 15 mm;  ? 18 Atm x 30 Sec - 3 inflations from distal to proximal ? Final Diameter: ~3.7 mm    ASSESSMENT:    1. Coronary artery disease involving native coronary artery of native heart without angina pectoris   2. Mixed hyperlipidemia   3. Essential hypertension   4. Controlled type 2 diabetes mellitus without complication, without long-term current use of insulin (HCC)      PLAN:  In order of problems listed above:  1. CAD: Denies any recent anginal symptoms.  Her last PCI was in 2015.  Continue aspirin and Effient  2. Hypertension: Blood pressure stable  3. Hyperlipidemia: Continue statin therapy.  Due for fasting lipid panel   4. DM2: Managed by primary care provider.     Medication Adjustments/Labs and Tests Ordered: Current medicines are reviewed at length with the patient today.  Concerns regarding medicines are outlined above.  Medication changes, Labs and Tests ordered today are listed in the Patient Instructions below. Patient Instructions  Medication Instructions:  Your physician recommends that you continue on your current medications as directed. Please refer to the Current Medication list given to you today.  If you need a refill on your cardiac medications before your next appointment, please call your pharmacy.   Lab work: You will need to return to the office in 1 month for labs (blood work) drawn:  Fasting Lipid Panel-DO NOT eat or drink past midnight  Liver Function Test  If you have labs (blood work) drawn today and your tests are completely normal, you will receive your results only by: Marland Kitchen MyChart Message (if you have MyChart) OR . A paper copy in the mail If you have any lab test that is abnormal or we need to change your treatment, we will call you to review the results.  Testing/Procedures: NONE ordered at this time of appointment   Follow-Up: At [facility], you and your health needs are our priority.  As part of our continuing mission to provide you with exceptional heart care, we have created designated Provider Care Teams.  These Care Teams include your primary Cardiologist (physician) and Advanced Practice Providers (APPs -  Physician Assistants and Nurse Practitioners) who all work together to provide you with the care you need, when you need it. You will need a follow up appointment in 6 months.  Please call our office 2 months in advance to schedule this appointment.  You may see Peter Martinique, MD or one of the following Advanced Practice Providers on your designated Care Team: [place], [place] . Fabian Sharp, PA-C  Any Other Special Instructions Will Be Listed Below (If Applicable).       Hilbert Corrigan, [place]   04/13/2019 8:25 PM    [facility] [address], [place], [place]  [address] Phone: [phone]; Fax: [phone]

## 2019-04-13 ENCOUNTER — Encounter: Payer: Self-pay | Admitting: Physician Assistant

## 2019-04-13 ENCOUNTER — Other Ambulatory Visit: Payer: Self-pay | Admitting: Family Medicine

## 2019-04-13 DIAGNOSIS — I2510 Atherosclerotic heart disease of native coronary artery without angina pectoris: Secondary | ICD-10-CM

## 2019-04-13 DIAGNOSIS — I10 Essential (primary) hypertension: Secondary | ICD-10-CM

## 2019-04-13 DIAGNOSIS — Z9861 Coronary angioplasty status: Secondary | ICD-10-CM

## 2019-04-13 DIAGNOSIS — Z720 Tobacco use: Secondary | ICD-10-CM

## 2019-04-18 ENCOUNTER — Other Ambulatory Visit: Payer: Self-pay | Admitting: Family Medicine

## 2019-04-18 DIAGNOSIS — J449 Chronic obstructive pulmonary disease, unspecified: Secondary | ICD-10-CM

## 2019-04-30 ENCOUNTER — Other Ambulatory Visit: Payer: Self-pay

## 2019-04-30 ENCOUNTER — Telehealth (INDEPENDENT_AMBULATORY_CARE_PROVIDER_SITE_OTHER): Payer: Medicaid Other | Admitting: Family Medicine

## 2019-04-30 DIAGNOSIS — E785 Hyperlipidemia, unspecified: Secondary | ICD-10-CM

## 2019-04-30 DIAGNOSIS — I10 Essential (primary) hypertension: Secondary | ICD-10-CM

## 2019-04-30 DIAGNOSIS — E113293 Type 2 diabetes mellitus with mild nonproliferative diabetic retinopathy without macular edema, bilateral: Secondary | ICD-10-CM | POA: Insufficient documentation

## 2019-04-30 NOTE — Assessment & Plan Note (Signed)
Continue lipitor  ?

## 2019-04-30 NOTE — Patient Instructions (Signed)
Diabetic Retinopathy Diabetic retinopathy is a disease of the retina. The retina is a light-sensitive membrane at the back of the eye. Retinopathy is a complication of diabetes (diabetes mellitus) and a common cause of bad eyesight (visual impairment). It can eventually cause blindness. Early detection and treatment of diabetic retinopathy is important in keeping your eyes healthy and preventing further damage to them. What are the causes? Diabetic retinopathy is caused by blood sugar (glucose) levels that are too high for an extended period of time. High blood glucose over an extended period of time can:  Damage small blood vessels in the retina, allowing blood to leak through the vessel walls.  Cause new, abnormal blood vessels to grow on the retina. This can scar the retina in the advanced stage of diabetic retinopathy. What increases the risk? You are more likely to develop this condition if:  You have had diabetes for a long time.  You have poorly controlled blood glucose.  You have high blood pressure. What are the signs or symptoms? In the early stages of diabetic retinopathy, there are often no symptoms. As the condition gets worse, symptoms may include:  Blurred vision. This is usually caused by swelling due to abnormal blood glucose levels. The blurriness may go away when blood glucose levels return to normal.  Moving specks or dark spots (floaters) in your vision. These can be caused by a small amount of bleeding (hemorrhage) from retinal blood vessels.  Missing parts of your field of vision, such as vision at the sides of the eyes. This can be caused by larger retinal hemorrhages.  Difficulty reading.  Double vision.  Pain in one or both eyes.  Feeling pressure in one or both eyes.  Trouble seeing straight lines. Straight lines may not look straight.  Redness of the eyes that does not go away. How is this diagnosed? This condition may be diagnosed with an eye exam in  which your eye care specialist puts drops in your eyes that enlarge (dilate) your pupils. This lets your health care provider examine your retina and check for changes in your retinal blood vessels. How is this treated? This condition may be treated by:  Keeping your blood glucose and blood pressure within a target range.  Using a type of laser beam to seal your retinal blood vessels. This stops them from bleeding and decreases pressure in your eye.  Getting shots of medicine in the eye to reduce swelling of the center of the retina (macula). You may be given: ? Anti-VEGF medicine. This medicine can help slow vision loss, and may even improve vision. ? Steroid medicine. Follow these instructions at home:   Follow your diabetes management plan as directed by your health care provider. This may include exercising regularly and eating a healthy diet.  Keep your blood glucose level and your blood pressure in your target range, as directed by your health care provider.  Check your blood glucose as often as directed.  Take over the counter and prescription medicines only as told by your health care provider. This includes insulin and oral diabetes medicine.  Get your eyes checked at least once every year. An eye specialist can usually see diabetic retinopathy developing long before it starts to cause problems. In many cases, it can be treated to prevent complications from occurring.  Do not use any products that contain nicotine or tobacco, such as cigarettes and e-cigarettes. If you need help quitting, ask your health care provider.  Keep all follow-up   visits as told by your health care provider. This is important. Contact a health care provider if:  You notice gradual blurring or other changes in your vision over time.  You notice that your glasses or contact lenses do not make things look as sharp as they once did.  You have trouble reading or seeing details at a distance with either  eye.  You notice a change in your vision or notice that parts of your field of vision appear missing or hazy.  You suddenly see moving specks or dark spots in the field of vision of either eye. Get help right away if:  You have sudden pain or pressure in one or both eyes.  You suddenly lose vision or a curtain or veil seems to come across your eyes.  You have a sudden burst of floaters in your vision. Summary  Diabetic retinopathy is a disease of the retina. The retina is a light-sensitive membrane at the back of the eye. Retinopathy is a complication of diabetes.  Get your eyes checked at least once every year. An eye specialist can usually see diabetic retinopathy developing long before it starts to cause problems. In many cases, it can be treated to prevent complications from occurring.  Keep your blood glucose and your blood pressure in target range. Follow your diabetes management plan as directed by your health care provider.  Protect your eyes. Wear sunglasses and eye protection when needed. This information is not intended to replace advice given to you by your health care provider. Make sure you discuss any questions you have with your health care provider. Document Released: 08/26/2000 Document Revised: 10/11/2017 Document Reviewed: 10/03/2016 Elsevier Patient Education  2020 [employer].

## 2019-04-30 NOTE — Assessment & Plan Note (Signed)
Advised of need for tight glycemic control. Risk of blindness.

## 2019-04-30 NOTE — Progress Notes (Signed)
   Subjective:    Patient ID: [name] [name] is a 58 y.o. male presenting with No chief complaint on file.  on 04/30/2019 I connected with [name] [name] on 04/30/2019  at 13:53 by telephone at home and verified that I am speaking with the correct person using two identifiers.   I discussed the limitations, risks, security and privacy concerns of performing an evaluation and management service by telephone and the availability of in person appointments. I also discussed with the patient that there may be a patient responsible charge related to this service. The patient expressed understanding and agreed to proceed.  HPI: States he has something inside him, moving. Worried he has Parkinson's. Now feels fine. And he noted last month, he had the shakes. Noted it in the chest area. Reports some shortness of breath. Not controlling his eating very well. Saw optho and told he had diabetic retinopathy.  Review of Systems  Constitutional: Negative for chills and fever.  Respiratory: Negative for shortness of breath.   Cardiovascular: Negative for leg swelling.  Gastrointestinal: Negative for abdominal pain, nausea and vomiting.      Objective:    Physical Exam General:  Alert, oriented and cooperative.   Mental Status: Normal mood and affect perceived. Normal judgment and thought content.  Physical exam deferred due to nature of the encounter     Assessment & Plan:   Problem List Items Addressed This Visit      Medium   Essential hypertension - Primary (Chronic)    Normal BP at last office visit--continue Lisinopril and Carvedilol--Reassured patient due to lack of recurrence of symptoms that it is unlikely to be related to his lisinopril      Hyperlipidemia with target LDL less than 70 (Chronic)    Continue lipitor        Unprioritized   Mild nonproliferative diabetic retinopathy of both eyes without macular edema associated with type 2 diabetes mellitus ([place])    Advised of need for tight  glycemic control. Risk of blindness.         Total non face-to-face time with patient: 10 minutes. Over 50% of encounter was spent on counseling and coordination of care. Follow up in 3 months. [name] [name] 04/30/2019 1:49 PM

## 2019-04-30 NOTE — Assessment & Plan Note (Signed)
Normal BP at last office visit--continue Lisinopril and Carvedilol--Reassured patient due to lack of recurrence of symptoms that it is unlikely to be related to his lisinopril

## 2019-05-08 ENCOUNTER — Other Ambulatory Visit: Payer: Self-pay | Admitting: Family Medicine

## 2019-05-08 DIAGNOSIS — E1159 Type 2 diabetes mellitus with other circulatory complications: Secondary | ICD-10-CM

## 2019-05-08 DIAGNOSIS — Z794 Long term (current) use of insulin: Secondary | ICD-10-CM

## 2019-05-09 ENCOUNTER — Other Ambulatory Visit: Payer: Self-pay

## 2019-05-09 DIAGNOSIS — E782 Mixed hyperlipidemia: Secondary | ICD-10-CM

## 2019-05-10 LAB — LIPID PANEL
Chol/HDL Ratio: 2.8 ratio (ref 0.0–5.0)
Cholesterol, Total: 94 mg/dL — ABNORMAL LOW (ref 100–199)
HDL: 33 mg/dL — ABNORMAL LOW (ref >39)
LDL Calculated: 32 mg/dL (ref 0–99)
Triglycerides: 143 mg/dL (ref 0–149)
VLDL Cholesterol Cal: 29 mg/dL (ref 5–40)

## 2019-05-10 LAB — HEPATIC FUNCTION PANEL
ALT: 16 IU/L (ref 0–44)
AST: 17 IU/L (ref 0–40)
Albumin: 4.3 g/dL (ref 3.8–4.9)
Alkaline Phosphatase: 124 IU/L — ABNORMAL HIGH (ref 39–117)
Bilirubin Total: 0.3 mg/dL (ref 0.0–1.2)
Bilirubin, Direct: 0.11 mg/dL (ref 0.00–0.40)
Total Protein: 7.1 g/dL (ref 6.0–8.5)

## 2019-05-16 ENCOUNTER — Other Ambulatory Visit: Payer: Self-pay | Admitting: Family Medicine

## 2019-05-16 DIAGNOSIS — I10 Essential (primary) hypertension: Secondary | ICD-10-CM

## 2019-05-16 DIAGNOSIS — Z720 Tobacco use: Secondary | ICD-10-CM

## 2019-05-16 DIAGNOSIS — Z9861 Coronary angioplasty status: Secondary | ICD-10-CM

## 2019-05-16 DIAGNOSIS — I2510 Atherosclerotic heart disease of native coronary artery without angina pectoris: Secondary | ICD-10-CM

## 2019-05-24 ENCOUNTER — Encounter: Payer: Self-pay | Admitting: Family Medicine

## 2019-05-24 ENCOUNTER — Ambulatory Visit: Payer: Medicaid Other | Admitting: Family Medicine

## 2019-05-24 ENCOUNTER — Other Ambulatory Visit: Payer: Self-pay

## 2019-05-24 ENCOUNTER — Other Ambulatory Visit: Payer: Self-pay | Admitting: Family Medicine

## 2019-05-24 VITALS — BP 132/60 | HR 77 | Ht 67.0 in | Wt 293.0 lb

## 2019-05-24 DIAGNOSIS — Z794 Long term (current) use of insulin: Secondary | ICD-10-CM | POA: Diagnosis not present

## 2019-05-24 DIAGNOSIS — G4733 Obstructive sleep apnea (adult) (pediatric): Secondary | ICD-10-CM

## 2019-05-24 DIAGNOSIS — Z87891 Personal history of nicotine dependence: Secondary | ICD-10-CM

## 2019-05-24 DIAGNOSIS — Z6841 Body Mass Index (BMI) 40.0 and over, adult: Secondary | ICD-10-CM

## 2019-05-24 DIAGNOSIS — I10 Essential (primary) hypertension: Secondary | ICD-10-CM | POA: Diagnosis not present

## 2019-05-24 DIAGNOSIS — E1159 Type 2 diabetes mellitus with other circulatory complications: Secondary | ICD-10-CM | POA: Diagnosis not present

## 2019-05-24 DIAGNOSIS — Z9989 Dependence on other enabling machines and devices: Secondary | ICD-10-CM | POA: Diagnosis not present

## 2019-05-24 DIAGNOSIS — Z23 Encounter for immunization: Secondary | ICD-10-CM | POA: Diagnosis not present

## 2019-05-24 DIAGNOSIS — J449 Chronic obstructive pulmonary disease, unspecified: Secondary | ICD-10-CM

## 2019-05-24 DIAGNOSIS — E66813 Obesity, class 3: Secondary | ICD-10-CM

## 2019-05-24 DIAGNOSIS — E113293 Type 2 diabetes mellitus with mild nonproliferative diabetic retinopathy without macular edema, bilateral: Secondary | ICD-10-CM

## 2019-05-24 LAB — POCT GLYCOSYLATED HEMOGLOBIN (HGB A1C): HbA1c, POC (controlled diabetic range): 7.9 % — AB (ref 0.0–7.0)

## 2019-05-24 NOTE — Assessment & Plan Note (Signed)
Continue metformin, insulin victoza, jardiance. A1C is 7.9

## 2019-05-24 NOTE — Patient Instructions (Signed)
Exercising to Lose Weight Exercise is structured, repetitive physical activity to improve fitness and health. Getting regular exercise is important for everyone. It is especially important if you are overweight. Being overweight increases your risk of heart disease, stroke, diabetes, high blood pressure, and several types of cancer. Reducing your calorie intake and exercising can help you lose weight. Exercise is usually categorized as moderate or vigorous intensity. To lose weight, most people need to do a certain amount of moderate-intensity or vigorous-intensity exercise each week. Moderate-intensity exercise  Moderate-intensity exercise is any activity that gets you moving enough to burn at least three times more energy (calories) than if you were sitting. Examples of moderate exercise include:  Walking a mile in 15 minutes.  Doing light yard work.  Biking at an easy pace. Most people should get at least 150 minutes (2 hours and 30 minutes) a week of moderate-intensity exercise to maintain their body weight. Vigorous-intensity exercise Vigorous-intensity exercise is any activity that gets you moving enough to burn at least six times more calories than if you were sitting. When you exercise at this intensity, you should be working hard enough that you are not able to carry on a conversation. Examples of vigorous exercise include:  Running.  Playing a team sport, such as football, basketball, and soccer.  Jumping rope. Most people should get at least 75 minutes (1 hour and 15 minutes) a week of vigorous-intensity exercise to maintain their body weight. How can exercise affect me? When you exercise enough to burn more calories than you eat, you lose weight. Exercise also reduces body fat and builds muscle. The more muscle you have, the more calories you burn. Exercise also:  Improves mood.  Reduces stress and tension.  Improves your overall fitness, flexibility, and endurance.   Increases bone strength. The amount of exercise you need to lose weight depends on:  Your age.  The type of exercise.  Any health conditions you have.  Your overall physical ability. Talk to your health care provider about how much exercise you need and what types of activities are safe for you. What actions can I take to lose weight? Nutrition   Make changes to your diet as told by your health care provider or diet and nutrition specialist (dietitian). This may include: ? Eating fewer calories. ? Eating more protein. ? Eating less unhealthy fats. ? Eating a diet that includes fresh fruits and vegetables, whole grains, low-fat dairy products, and lean protein. ? Avoiding foods with added fat, salt, and sugar.  Drink plenty of water while you exercise to prevent dehydration or heat stroke. Activity  Choose an activity that you enjoy and set realistic goals. Your health care provider can help you make an exercise plan that works for you.  Exercise at a moderate or vigorous intensity most days of the week. ? The intensity of exercise may vary from person to person. You can tell how intense a workout is for you by paying attention to your breathing and heartbeat. Most people will notice their breathing and heartbeat get faster with more intense exercise.  Do resistance training twice each week, such as: ? Push-ups. ? Sit-ups. ? Lifting weights. ? Using resistance bands.  Getting short amounts of exercise can be just as helpful as long structured periods of exercise. If you have trouble finding time to exercise, try to include exercise in your daily routine. ? Get up, stretch, and walk around every 30 minutes throughout the day. ? Go for a   walk during your lunch break. ? Park your car farther away from your destination. ? If you take public transportation, get off one stop early and walk the rest of the way. ? Make phone calls while standing up and walking around. ? Take the  stairs instead of elevators or escalators.  Wear comfortable clothes and shoes with good support.  Do not exercise so much that you hurt yourself, feel dizzy, or get very short of breath. Where to find more information  [place] Department of Health and Human Services: [employer]  Centers for Disease Control and Prevention (CDC): http://www.wolf.info/ Contact a health care provider:  Before starting a new exercise program.  If you have questions or concerns about your weight.  If you have a medical problem that keeps you from exercising. Get help right away if you have any of the following while exercising:  Injury.  Dizziness.  Difficulty breathing or shortness of breath that does not go away when you stop exercising.  Chest pain.  Rapid heartbeat. Summary  Being overweight increases your risk of heart disease, stroke, diabetes, high blood pressure, and several types of cancer.  Losing weight happens when you burn more calories than you eat.  Reducing the amount of calories you eat in addition to getting regular moderate or vigorous exercise each week helps you lose weight. This information is not intended to replace advice given to you by your health care provider. Make sure you discuss any questions you have with your health care provider. Document Released: 10/01/2010 Document Revised: 09/11/2017 Document Reviewed: 09/11/2017 Elsevier Patient Education  2020 [employer] Calories are units of energy. Your body needs a certain amount of calories from food to keep you going throughout the day. When you eat more calories than your body needs, your body stores the extra calories as fat. When you eat fewer calories than your body needs, your body burns fat to get the energy it needs. Calorie counting means keeping track of how many calories you eat and drink each day. Calorie counting can be helpful if you need to lose weight. If you make sure to eat fewer  calories than your body needs, you should lose weight. Ask your health care provider what a healthy weight is for you. For calorie counting to work, you will need to eat the right number of calories in a day in order to lose a healthy amount of weight per week. A dietitian can help you determine how many calories you need in a day and will give you suggestions on how to reach your calorie goal.  A healthy amount of weight to lose per week is usually 1-2 lb (0.5-0.9 kg). This usually means that your daily calorie intake should be reduced by 500-750 calories.  Eating 1,200 - 1,500 calories per day can help most women lose weight.  Eating 1,500 - 1,800 calories per day can help most men lose weight. What is my plan? My goal is to have __________ calories per day. If I have this many calories per day, I should lose around __________ pounds per week. What do I need to know about calorie counting? In order to meet your daily calorie goal, you will need to:  Find out how many calories are in each food you would like to eat. Try to do this before you eat.  Decide how much of the food you plan to eat.  Write down what you ate and how many calories it  had. Doing this is called keeping a food log. To successfully lose weight, it is important to balance calorie counting with a healthy lifestyle that includes regular activity. Aim for 150 minutes of moderate exercise (such as walking) or 75 minutes of vigorous exercise (such as running) each week. Where do I find calorie information?  The number of calories in a food can be found on a Nutrition Facts label. If a food does not have a Nutrition Facts label, try to look up the calories online or ask your dietitian for help. Remember that calories are listed per serving. If you choose to have more than one serving of a food, you will have to multiply the calories per serving by the amount of servings you plan to eat. For example, the label on a package of  bread might say that a serving size is 1 slice and that there are 90 calories in a serving. If you eat 1 slice, you will have eaten 90 calories. If you eat 2 slices, you will have eaten 180 calories. How do I keep a food log? Immediately after each meal, record the following information in your food log:  What you ate. Don't forget to include toppings, sauces, and other extras on the food.  How much you ate. This can be measured in cups, ounces, or number of items.  How many calories each food and drink had.  The total number of calories in the meal. Keep your food log near you, such as in a small notebook in your pocket, or use a mobile app or website. Some programs will calculate calories for you and show you how many calories you have left for the day to meet your goal. What are some calorie counting tips?   Use your calories on foods and drinks that will fill you up and not leave you hungry: ? Some examples of foods that fill you up are nuts and nut butters, vegetables, lean proteins, and high-fiber foods like whole grains. High-fiber foods are foods with more than 5 g fiber per serving. ? Drinks such as sodas, specialty coffee drinks, alcohol, and juices have a lot of calories, yet do not fill you up.  Eat nutritious foods and avoid empty calories. Empty calories are calories you get from foods or beverages that do not have many vitamins or protein, such as candy, sweets, and soda. It is better to have a nutritious high-calorie food (such as an avocado) than a food with few nutrients (such as a bag of chips).  Know how many calories are in the foods you eat most often. This will help you calculate calorie counts faster.  Pay attention to calories in drinks. Low-calorie drinks include water and unsweetened drinks.  Pay attention to nutrition labels for "low fat" or "fat free" foods. These foods sometimes have the same amount of calories or more calories than the full fat versions. They  also often have added sugar, starch, or salt, to make up for flavor that was removed with the fat.  Find a way of tracking calories that works for you. Get creative. Try different apps or programs if writing down calories does not work for you. What are some portion control tips?  Know how many calories are in a serving. This will help you know how many servings of a certain food you can have.  Use a measuring cup to measure serving sizes. You could also try weighing out portions on a kitchen scale. With time, you  will be able to estimate serving sizes for some foods.  Take some time to put servings of different foods on your favorite plates, bowls, and cups so you know what a serving looks like.  Try not to eat straight from a bag or box. Doing this can lead to overeating. Put the amount you would like to eat in a cup or on a plate to make sure you are eating the right portion.  Use smaller plates, glasses, and bowls to prevent overeating.  Try not to multitask (for example, watch TV or use your computer) while eating. If it is time to eat, sit down at a table and enjoy your food. This will help you to know when you are full. It will also help you to be aware of what you are eating and how much you are eating. What are tips for following this plan? Reading food labels  Check the calorie count compared to the serving size. The serving size may be smaller than what you are used to eating.  Check the source of the calories. Make sure the food you are eating is high in vitamins and protein and low in saturated and trans fats. Shopping  Read nutrition labels while you shop. This will help you make healthy decisions before you decide to purchase your food.  Make a grocery list and stick to it. Cooking  Try to cook your favorite foods in a healthier way. For example, try baking instead of frying.  Use low-fat dairy products. Meal planning  Use more fruits and vegetables. Half of your  plate should be fruits and vegetables.  Include lean proteins like poultry and fish. How do I count calories when eating out?  Ask for smaller portion sizes.  Consider sharing an entree and sides instead of getting your own entree.  If you get your own entree, eat only half. Ask for a box at the beginning of your meal and put the rest of your entree in it so you are not tempted to eat it.  If calories are listed on the menu, choose the lower calorie options.  Choose dishes that include vegetables, fruits, whole grains, low-fat dairy products, and lean protein.  Choose items that are boiled, broiled, grilled, or steamed. Stay away from items that are buttered, battered, fried, or served with cream sauce. Items labeled "crispy" are usually fried, unless stated otherwise.  Choose water, low-fat milk, unsweetened iced tea, or other drinks without added sugar. If you want an alcoholic beverage, choose a lower calorie option such as a glass of wine or light beer.  Ask for dressings, sauces, and syrups on the side. These are usually high in calories, so you should limit the amount you eat.  If you want a salad, choose a garden salad and ask for grilled meats. Avoid extra toppings like bacon, cheese, or fried items. Ask for the dressing on the side, or ask for olive oil and vinegar or lemon to use as dressing.  Estimate how many servings of a food you are given. For example, a serving of cooked rice is  cup or about the size of half a baseball. Knowing serving sizes will help you be aware of how much food you are eating at restaurants. The list below tells you how big or small some common portion sizes are based on everyday objects: ? 1 oz-4 stacked dice. ? 3 oz-1 deck of cards. ? 1 tsp-1 die. ? 1 Tbsp- a ping-pong ball. ? 2  Tbsp-1 ping-pong ball. ?  cup- baseball. ? 1 cup-1 baseball. Summary  Calorie counting means keeping track of how many calories you eat and drink each day. If you  eat fewer calories than your body needs, you should lose weight.  A healthy amount of weight to lose per week is usually 1-2 lb (0.5-0.9 kg). This usually means reducing your daily calorie intake by 500-750 calories.  The number of calories in a food can be found on a Nutrition Facts label. If a food does not have a Nutrition Facts label, try to look up the calories online or ask your dietitian for help.  Use your calories on foods and drinks that will fill you up, and not on foods and drinks that will leave you hungry.  Use smaller plates, glasses, and bowls to prevent overeating. This information is not intended to replace advice given to you by your health care provider. Make sure you discuss any questions you have with your health care provider. Document Released: 08/29/2005 Document Revised: 05/18/2018 Document Reviewed: 07/29/2016 Elsevier Patient Education  2020 Elsevier Inc.  

## 2019-05-24 NOTE — Assessment & Plan Note (Signed)
Cut out flour, sugar--diet plan written given to patient. Increase exercise.

## 2019-05-24 NOTE — Progress Notes (Signed)
   Subjective:    Patient ID: Froylan Marolt is a 58 y.o. male presenting with Diabetes  on 05/24/2019  HPI: Here today for follow up. He is having heart palpitations or tremors inside his chest. Notes these are usually is at night. Doesn't last > 30 seconds.  He stopped his lisinopril twice daily BP meds, and is now only on 1 lisinopril daily. Denies SOB or associated CP.  Legs are week.  Review of Systems  Constitutional: Negative for chills and fever.  Respiratory: Negative for shortness of breath.   Cardiovascular: Negative for leg swelling.  Gastrointestinal: Negative for abdominal pain, nausea and vomiting.      Objective:    BP 132/60   Pulse 77   Ht 5\' 7"  (1.702 m)   Wt 293 lb (132.9 kg)   SpO2 92%   BMI 45.89 kg/m  Physical Exam Vitals signs reviewed.  Constitutional:      General: He is not in acute distress.    Appearance: He is well-developed.  HENT:     Head: Normocephalic and atraumatic.  Eyes:     General: No scleral icterus. Neck:     Musculoskeletal: Neck supple.  Cardiovascular:     Rate and Rhythm: Normal rate.  Pulmonary:     Effort: Pulmonary effort is normal.  Abdominal:     Palpations: Abdomen is soft.  Skin:    General: Skin is warm.  Neurological:     Mental Status: He is alert.         Assessment & Plan:   Problem List Items Addressed This Visit      Medium   Essential hypertension (Chronic)    Continue Lisinopril and carvedilol      OSA on CPAP (Chronic)    Needs new study to update equipment Continue CPAP      Relevant Orders   Ambulatory referral to Sleep Studies   DM type 2 (diabetes mellitus, type 2) ([place]) - Primary    Continue metformin, insulin victoza, jardiance. A1C is 7.9      Relevant Orders   HgB A1c (Completed)   Obesity    Cut out flour, sugar--diet plan written given to patient. Increase exercise.      Former tobacco use    Has smoked a few cigarettes of his sister's due to increased weight gain.         Unprioritized   Mild nonproliferative diabetic retinopathy of both eyes without macular edema associated with type 2 diabetes mellitus ([place])    Good glycemic control is needed--f/u with opthalmology is recommended in 6 months.       Other Visit Diagnoses    Need for immunization against influenza       Relevant Orders   Flu Vaccine QUAD 36+ mos IM (Completed)      Total face-to-face time with patient: 25 minutes. Over 50% of encounter was spent on counseling and coordination of care. Return in about 3 months (around 08/23/2019).  [name] 05/24/2019 4:59 PM

## 2019-05-24 NOTE — Assessment & Plan Note (Signed)
Continue Lisinopril and carvedilol

## 2019-05-24 NOTE — Assessment & Plan Note (Signed)
Good glycemic control is needed--f/u with opthalmology is recommended in 6 months.

## 2019-05-24 NOTE — Assessment & Plan Note (Signed)
Has smoked a few cigarettes of his sister's due to increased weight gain.

## 2019-05-24 NOTE — Assessment & Plan Note (Addendum)
Needs new study to update equipment Continue CPAP

## 2019-05-30 ENCOUNTER — Other Ambulatory Visit: Payer: Self-pay | Admitting: Family Medicine

## 2019-06-06 ENCOUNTER — Other Ambulatory Visit: Payer: Self-pay | Admitting: Family Medicine

## 2019-06-06 ENCOUNTER — Telehealth: Payer: Self-pay | Admitting: *Deleted

## 2019-06-06 DIAGNOSIS — E1159 Type 2 diabetes mellitus with other circulatory complications: Secondary | ICD-10-CM

## 2019-06-06 DIAGNOSIS — Z794 Long term (current) use of insulin: Secondary | ICD-10-CM

## 2019-06-06 NOTE — Telephone Encounter (Signed)
Recieved call from pharmacy, 21ml of novolog will only last pt 10 days.  Can we send in a script that will last 30 days.   Christen Bame, CMA

## 2019-06-07 MED ORDER — INSULIN ASPART 100 UNIT/ML ~~LOC~~ SOLN: SUBCUTANEOUS | 3 refills | Status: DC

## 2019-06-11 ENCOUNTER — Other Ambulatory Visit: Payer: Self-pay | Admitting: Family Medicine

## 2019-06-11 DIAGNOSIS — I10 Essential (primary) hypertension: Secondary | ICD-10-CM

## 2019-06-11 DIAGNOSIS — I214 Non-ST elevation (NSTEMI) myocardial infarction: Secondary | ICD-10-CM

## 2019-06-11 MED ORDER — INSULIN GLARGINE 100 UNIT/ML ~~LOC~~ SOLN: SUBCUTANEOUS | 3 refills | Status: DC

## 2019-06-11 NOTE — Addendum Note (Signed)
Addended by: Christen Bame D on: 06/11/2019 10:26 AM   Modules accepted: Orders

## 2019-06-11 NOTE — Telephone Encounter (Signed)
Pt also needed Lantus corrected for a months supply.  Done and sent. Christen Bame, CMA

## 2019-06-12 ENCOUNTER — Other Ambulatory Visit: Payer: Self-pay | Admitting: Family Medicine

## 2019-06-12 DIAGNOSIS — G8929 Other chronic pain: Secondary | ICD-10-CM

## 2019-06-13 ENCOUNTER — Other Ambulatory Visit: Payer: Self-pay | Admitting: Family Medicine

## 2019-06-13 DIAGNOSIS — I10 Essential (primary) hypertension: Secondary | ICD-10-CM

## 2019-06-13 DIAGNOSIS — I2510 Atherosclerotic heart disease of native coronary artery without angina pectoris: Secondary | ICD-10-CM

## 2019-06-14 ENCOUNTER — Encounter: Payer: Self-pay | Admitting: Family Medicine

## 2019-06-20 ENCOUNTER — Other Ambulatory Visit: Payer: Self-pay

## 2019-06-20 ENCOUNTER — Telehealth: Payer: Medicaid Other | Admitting: Pharmacist

## 2019-06-24 ENCOUNTER — Other Ambulatory Visit: Payer: Self-pay | Admitting: Physician Assistant

## 2019-06-24 DIAGNOSIS — I2510 Atherosclerotic heart disease of native coronary artery without angina pectoris: Secondary | ICD-10-CM

## 2019-06-27 ENCOUNTER — Other Ambulatory Visit: Payer: Self-pay

## 2019-06-27 ENCOUNTER — Telehealth (INDEPENDENT_AMBULATORY_CARE_PROVIDER_SITE_OTHER): Payer: Medicaid Other | Admitting: Pharmacist

## 2019-06-27 DIAGNOSIS — F32A Depression, unspecified: Secondary | ICD-10-CM

## 2019-06-27 DIAGNOSIS — Z794 Long term (current) use of insulin: Secondary | ICD-10-CM

## 2019-06-27 DIAGNOSIS — F329 Major depressive disorder, single episode, unspecified: Secondary | ICD-10-CM

## 2019-06-27 DIAGNOSIS — E1159 Type 2 diabetes mellitus with other circulatory complications: Secondary | ICD-10-CM

## 2019-06-27 MED ORDER — INSULIN GLARGINE 100 UNIT/ML ~~LOC~~ SOLN: 40.0000 [IU] | Freq: Two times a day (BID) | SUBCUTANEOUS | 3 refills | Status: DC

## 2019-06-27 MED ORDER — CITALOPRAM HYDROBROMIDE 20 MG PO TABS: 20.0000 mg | ORAL_TABLET | Freq: Every day | ORAL | 1 refills | Status: DC

## 2019-06-27 NOTE — Assessment & Plan Note (Signed)
Based on increase in exercise and decrease in blood glucose readings plan to decrease basal insulin dose.  Encouraged his exercise at "[employer]".  -DECREASED basal insulin Lantus (insulin glargine) from 45 units SQ BID to 40 units SQ BID.  -Continued  rapid insulin Novolog (insulin aspart) 45 units SQ BID with meals.  -Continued GLP-1 Victoza (generic name liraglutide) 1.8 mg SQ daily  -Continued SGLT2-I Jardiance (generic name empagliflozin) 25 mg PO daily. -Refilled citalopram

## 2019-06-27 NOTE — Progress Notes (Signed)
Patient contacted via telephone for virtual visit on 06/27/19 at 1:30PM.    Patient reports adherence with all medications. Pt reports he uses his Dulera inhaler 2 puffs prn and currently uses about 2 days per week. Also, pt uses albuterol 2 puffs prn every other day.  Pt states his BG numbers have improved. He does not have his BG readings with him, but remembers that he had one fasting blood glucose reading of 134. Patient denies hypoglycemic events. Patient reported joining [employer] last week; goes for one hour three times weekly.   Physical Exam: N/A (contacted via telephone)  ROS: N/A (contacted via telephone)  Based on increase in exercise and decrease in blood glucose readings plan to decrease basal insulin dose.  Encouraged his exercise at "[employer]".  -DECREASED basal insulin Lantus (insulin glargine) from 45 units SQ BID to 40 units SQ BID.  -Continued  rapid insulin Novolog (insulin aspart) 45 units SQ BID with meals.  -Continued GLP-1 Victoza (generic name liraglutide) 1.8 mg SQ daily  -Continued SGLT2-I Jardiance (generic name empagliflozin) 25 mg PO daily.  Total time in patient encounter - phone 20 minutes. Patient phone conversation participants included; Simonne Come, PharmD Candidate, and Drexel Iha, PharmD,  PGY2 Pharmacy Resident.    -Refilled citalopram at patient request - typically refilled by "[facility]"

## 2019-06-28 NOTE — Progress Notes (Signed)
Noted and agree. 

## 2019-07-03 ENCOUNTER — Ambulatory Visit: Payer: Medicaid Other | Admitting: Podiatry

## 2019-07-03 ENCOUNTER — Other Ambulatory Visit: Payer: Self-pay

## 2019-07-03 ENCOUNTER — Encounter: Payer: Self-pay | Admitting: Podiatry

## 2019-07-03 DIAGNOSIS — Z794 Long term (current) use of insulin: Secondary | ICD-10-CM

## 2019-07-03 DIAGNOSIS — M79674 Pain in right toe(s): Secondary | ICD-10-CM | POA: Diagnosis not present

## 2019-07-03 DIAGNOSIS — E119 Type 2 diabetes mellitus without complications: Secondary | ICD-10-CM

## 2019-07-03 DIAGNOSIS — B351 Tinea unguium: Secondary | ICD-10-CM

## 2019-07-03 DIAGNOSIS — M79675 Pain in left toe(s): Secondary | ICD-10-CM | POA: Diagnosis not present

## 2019-07-03 NOTE — Patient Instructions (Addendum)

## 2019-07-04 ENCOUNTER — Other Ambulatory Visit: Payer: Self-pay | Admitting: Family Medicine

## 2019-07-04 DIAGNOSIS — J449 Chronic obstructive pulmonary disease, unspecified: Secondary | ICD-10-CM

## 2019-07-07 NOTE — Progress Notes (Signed)
Subjective: [name] [name] is seen today for follow up painful, elongated, thickened toenails 1-5 b/l feet that he cannot cut. Pain interferes with daily activities. Aggravating factor includes wearing enclosed shoe gear and relieved with periodic debridement.  Current Outpatient Medications on File Prior to Visit  Medication Sig  . ACCU-CHEK FASTCLIX LANCETS MISC 1 Units by Percutaneous route 4 (four) times daily.  Marland Kitchen aspirin 81 MG chewable tablet Chew 1 tablet (81 mg total) by mouth daily.  Marland Kitchen atorvastatin (LIPITOR) 80 MG tablet TAKE ONE TABLET BY MOUTH DAILY  . BD ULTRA-FINE PEN NEEDLES 29G X 12.7MM MISC USE TO WITH VICTOZA PEN, INJECTION ONCE DAILY.  Marland Kitchen Blood Glucose Monitoring Suppl (ACCU-CHEK AVIVA PLUS) w/Device KIT CHECK BLOOD GLUCOSE FASTING AND 2 HOURS AFTER [facility] AND DINNER  . buPROPion (WELLBUTRIN XL) 150 MG 24 hr tablet TAKE ONE TABLET BY MOUTH DAILY  . carvedilol (COREG) 3.125 MG tablet TAKE ONE TABLET BY MOUTH TWICE A DAY WITH MEALS  . carvedilol (COREG) 6.25 MG tablet Take 1 tablet (6.25 mg total) by mouth 2 (two) times daily with a meal. (Patient not taking: Reported on 06/27/2019)  . ciprofloxacin-dexamethasone (CIPRODEX) OTIC suspension Place 4 drops into the left ear 2 (two) times daily.  . citalopram (CELEXA) 20 MG tablet Take 1 tablet (20 mg total) by mouth daily.  . DULERA 200-5 MCG/ACT AERO INHALE TWO PUFFS BY MOUTH TWICE A DAY  . empagliflozin (JARDIANCE) 25 MG TABS tablet Take 25 mg by mouth daily.  Marland Kitchen glucose blood (ACCU-CHEK AVIVA PLUS) test strip USE ONE STRIP TO TEST FOUR TIMES A DAY  . hydrOXYzine (VISTARIL) 25 MG capsule Take 1 capsule (25 mg total) by mouth at bedtime. (Patient not taking: Reported on 06/27/2019)  . insulin aspart (NOVOLOG) 100 UNIT/ML injection INJECT 50 UNITS UNDER THE SKIN TWO TIMES A DAY BEFORE A MEAL (Patient taking differently: Inject 45 Units into the skin 2 (two) times daily before a meal. INJECT 45 UNITS UNDER THE SKIN TWO TIMES A DAY  BEFORE A MEAL)  . insulin glargine (LANTUS) 100 UNIT/ML injection Inject 0.4 mLs (40 Units total) into the skin 2 (two) times daily.  . Insulin Syringe-Needle U-100 (B-D INS SYR ULTRAFINE 1CC/31G) 31G X 5/16" 1 ML MISC Use up to 5 times daily with Novolog and Lantus dosing  . liraglutide (VICTOZA) 18 MG/3ML SOPN INJECT 0.3 MLS (1.8 MG TOTAL) INTO THE SKIN DAILY  . lisinopril (PRINIVIL,ZESTRIL) 2.5 MG tablet TAKE ONE TABLET BY MOUTH TWICE A DAY (Patient taking differently: daily. Patient only taking one tab a day)  . Melatonin 2.5 MG CAPS Take 5 mg by mouth at bedtime.  . metFORMIN (GLUCOPHAGE) 1000 MG tablet TAKE ONE TABLET BY MOUTH TWICE A DAY WITH A MEAL  . Multiple Vitamin (MULTIVITAMIN) tablet Take 1 tablet by mouth daily.  . nitroGLYCERIN (NITROSTAT) 0.4 MG SL tablet PLACE 1 TABLET UNDER THE TONGUE EVERY 5 MINUTES FOR 3 DOSES AS NEEDED FOR CHEST PAIN. *IF NO RELIEF, CALL 911* (Patient not taking: Reported on 06/27/2019)  . Omega-3 Fatty Acids (FISH OIL PO) Take 1,000 mg by mouth 2 (two) times daily.   . prasugrel (EFFIENT) 10 MG TABS tablet TAKE ONE TABLET BY MOUTH DAILY  . traMADol (ULTRAM) 50 MG tablet TAKE ONE TABLET BY MOUTH EVERY 6 HOURS AS NEEDED  . [DISCONTINUED] Blood Glucose Monitoring Suppl (ACCU-CHEK AVIVA PLUS) w/Device KIT CHECK BLOOD GLUCOSE FASTING AND 2 HOURS AFTER BREAKFAST, LUNCH AND DINNER   No current facility-administered medications on file prior to  visit.      Allergies  Allergen Reactions  . Shellfish Allergy Nausea And Vomiting    OYSTERS     Objective:  Vascular Examination: Capillary refill time immediate x 10 digits.  Dorsalis pedis present b/l.  Posterior tibial pulses present b/l.  Digital hair present x 10 digits.  Skin temperature gradient WNL b/l.   Dermatological Examination: Skin with normal turgor, texture and tone b/l.  Toenails 1-5 b/l discolored, thick, dystrophic with subungual debris and pain with palpation to nailbeds due to  thickness of nails.  Musculoskeletal: Muscle strength 5/5 to all LE muscle groups  No gross bony deformities b/l.  No pain, crepitus or joint limitation noted with ROM.   Neurological Examination: Protective sensation intact 5/5 with 10 gram monofilament bilaterally.  Assessment: Painful onychomycosis toenails 1-5 b/l  NIDDM  Plan: 1. Toenails 1-5 b/l were debrided in length and girth without iatrogenic bleeding. 2. Patient to continue soft, supportive shoe gear 3. Patient to report any pedal injuries to medical professional immediately. 4. Follow up 3 months.  5. Patient/POA to call should there be a concern in the interim.

## 2019-07-10 ENCOUNTER — Other Ambulatory Visit: Payer: Self-pay | Admitting: Family Medicine

## 2019-07-10 ENCOUNTER — Encounter: Payer: Self-pay | Admitting: Family Medicine

## 2019-07-10 DIAGNOSIS — I10 Essential (primary) hypertension: Secondary | ICD-10-CM

## 2019-07-10 DIAGNOSIS — Z9861 Coronary angioplasty status: Secondary | ICD-10-CM

## 2019-07-10 DIAGNOSIS — I2510 Atherosclerotic heart disease of native coronary artery without angina pectoris: Secondary | ICD-10-CM

## 2019-07-25 ENCOUNTER — Telehealth: Payer: Self-pay | Admitting: Family Medicine

## 2019-07-25 DIAGNOSIS — G4733 Obstructive sleep apnea (adult) (pediatric): Secondary | ICD-10-CM

## 2019-07-25 NOTE — Telephone Encounter (Signed)
Will forward to MD to make her aware that an order was placed.  Jazmin Hartsell,CMA

## 2019-07-25 NOTE — Telephone Encounter (Signed)
[facility] is calling and needs to have orders put in so that they can schedule his appointment. They can not schedule off of a referral. jw

## 2019-07-26 ENCOUNTER — Other Ambulatory Visit: Payer: Self-pay | Admitting: Family Medicine

## 2019-07-26 DIAGNOSIS — B353 Tinea pedis: Secondary | ICD-10-CM

## 2019-07-26 DIAGNOSIS — G8929 Other chronic pain: Secondary | ICD-10-CM

## 2019-07-26 DIAGNOSIS — Z794 Long term (current) use of insulin: Secondary | ICD-10-CM

## 2019-07-26 DIAGNOSIS — E1159 Type 2 diabetes mellitus with other circulatory complications: Secondary | ICD-10-CM

## 2019-07-26 NOTE — Telephone Encounter (Signed)
I already put the order for the sleep study but just wanted you to know that I added it along with the referral that you had already placed.  Eugune Sine,CMA

## 2019-07-27 ENCOUNTER — Other Ambulatory Visit: Payer: Self-pay | Admitting: Family Medicine

## 2019-07-27 DIAGNOSIS — I10 Essential (primary) hypertension: Secondary | ICD-10-CM

## 2019-07-27 DIAGNOSIS — I214 Non-ST elevation (NSTEMI) myocardial infarction: Secondary | ICD-10-CM

## 2019-08-01 ENCOUNTER — Ambulatory Visit (INDEPENDENT_AMBULATORY_CARE_PROVIDER_SITE_OTHER): Payer: Medicaid Other | Admitting: Pharmacist

## 2019-08-01 ENCOUNTER — Encounter: Payer: Self-pay | Admitting: Pharmacist

## 2019-08-01 DIAGNOSIS — I10 Essential (primary) hypertension: Secondary | ICD-10-CM

## 2019-08-01 DIAGNOSIS — E1159 Type 2 diabetes mellitus with other circulatory complications: Secondary | ICD-10-CM | POA: Diagnosis not present

## 2019-08-01 DIAGNOSIS — Z87891 Personal history of nicotine dependence: Secondary | ICD-10-CM

## 2019-08-01 DIAGNOSIS — Z794 Long term (current) use of insulin: Secondary | ICD-10-CM | POA: Diagnosis not present

## 2019-08-01 MED ORDER — FISH OIL 1000 MG PO CAPS: 1000.0000 mg | ORAL_CAPSULE | Freq: Every day | ORAL | Status: DC

## 2019-08-01 MED ORDER — INSULIN GLARGINE 100 UNIT/ML ~~LOC~~ SOLN: 45.0000 [IU] | Freq: Two times a day (BID) | SUBCUTANEOUS | Status: DC

## 2019-08-01 MED ORDER — INSULIN ASPART 100 UNIT/ML ~~LOC~~ SOLN: 45.0000 [IU] | Freq: Two times a day (BID) | SUBCUTANEOUS | Status: DC

## 2019-08-01 MED ORDER — LISINOPRIL 2.5 MG PO TABS: 2.5000 mg | ORAL_TABLET | Freq: Every day | ORAL | Status: DC

## 2019-08-01 NOTE — Assessment & Plan Note (Signed)
Diabetes longstanding on multiple drug therapy and currently doing very well. Patient is able to verbalize all medications and appropriate hypoglycemia management plan. Control is suboptimal due to sedentary lifestyle and suboptimal diet.  -Continued basal insulin lantus (insulin glargine) at 45 units daily -Continued  rapid insulin Nololog  (insulin aspart) at 45 units prior to largest 1-2 meals daily.  -Continued GLP-1 Victoza (generic name liraglutide) at 1.8mg  daily.  -Continued SGLT2-I  Jardiance (generic name empagliflozin) at 25mg .  -Extensively discussed pathophysiology of diabetes, recommended lifestyle interventions, dietary effects on blood sugar control. -Counseled on s/sx of and management of hypoglycemia -Next A1C anticipated at PCP visit in December.  Next Rx clinic in January (asked patient to schedule after next PCP visit).

## 2019-08-01 NOTE — Progress Notes (Signed)
    S:     Chief Complaint  Patient presents with  . Medication Management    Diabetes    Patient visit conducted over the phone per patient request.  Patient was referred and last seen by Primary Care Provider on 05/24/2019.   Patient was last seen in Rx clinic on 06/27/2019.Marland Kitchen   Insurance coverage/medication affordability: Medicaid.  Patient reports adherence with all medications. We updated all medication doses in [facility] at this visit.  Current diabetes medications include: Novolog 40-45 units with meals ~ 2 x per day, Lantus 45 units once or sometimes twice daily (depending on blood sugar) - ###  this was a patient adjustment to frequency ###    Current hypertension medications include: Carvedilol 3.125mg  bid and Lisinopril 2.5mg  daily. Current hyperlipidemia medications include: Atorvastatin 80mg   Patient denies hypoglycemic events.  Patient reported dietary habits: Eats 2-3 meals/day.  We discussed several food label; carb, protein and fat content.  He  Patient-reported exercise habits: increased - joined [employer] and is currently exercising 3x per week using both treadmill and bike for 1.3 miles.     Patient denies nocturia (nighttime urination).    O:   Lab Results  Component Value Date   HGBA1C 7.9 (A) 05/24/2019   Lipid Panel     Component Value Date/Time   CHOL 94 (L) 05/09/2019 1537   TRIG 143 05/09/2019 1537   HDL 33 (L) 05/09/2019 1537   CHOLHDL 2.8 05/09/2019 1537   CHOLHDL 3.7 03/29/2016 1227   VLDL 53 (H) 03/29/2016 1227   LDLCALC 32 05/09/2019 1537    Home fasting blood sugars: reported as low 100s 2 hour post-meal/random blood sugars: reported to be < 180 .  Clinical Atherosclerotic Cardiovascular Disease (ASCVD): Yes    A/P: Diabetes longstanding on multiple drug therapy and currently doing very well. Patient is able to verbalize all medications and appropriate hypoglycemia management plan. Control is suboptimal due to sedentary lifestyle and  suboptimal diet.  -Continued basal insulin lantus (insulin glargine) at 45 units daily -Continued  rapid insulin Nololog  (insulin aspart) at 45 units prior to largest 1-2 meals daily.  -Continued GLP-1 Victoza (generic name liraglutide) at 1.8mg  daily.  -Continued SGLT2-I  Jardiance (generic name empagliflozin) at 25mg .  -Extensively discussed pathophysiology of diabetes, recommended lifestyle interventions, dietary effects on blood sugar control. -Counseled on s/sx of and management of hypoglycemia -Next A1C anticipated at PCP visit in December.  Next Rx clinic in January (asked patient to schedule after next PCP visit).    ASCVD risk - secondary prevention in patient with diabetes. Last LDL is controlled. Continue  high intensity statin as indicated. Taking Prasugrel antiplatelet therapy per cardiology.   Former smoker -  Past tobacco abuse.  Reports continued abstinence.  Congratulated on quitting for > 1.5 years.  Encouraged continued abstience.   Total time in telephone medication review and counseling 23 minutes.   Follow up PCP December  And Pharmacy Clinic Visit in January.   Patient interview led by Donald Prose, PharmD Candidate.

## 2019-08-01 NOTE — Progress Notes (Signed)
Reviewed: I agree with Dr. Koval's documentation and management. 

## 2019-08-01 NOTE — Assessment & Plan Note (Signed)
Former smoker -  Past tobacco abuse.  Reports continued abstinence.  Congratulated on quitting for > 1.5 years.  Encouraged continued abstience.

## 2019-08-01 NOTE — Patient Instructions (Signed)
Today we reviewed your medications.   Updated all doses.    Keep active at your gym.    Congratulations on staying quit from smoking for > 1.5 years!

## 2019-08-08 ENCOUNTER — Other Ambulatory Visit: Payer: Self-pay | Admitting: Family Medicine

## 2019-08-08 DIAGNOSIS — I10 Essential (primary) hypertension: Secondary | ICD-10-CM

## 2019-08-08 DIAGNOSIS — I2510 Atherosclerotic heart disease of native coronary artery without angina pectoris: Secondary | ICD-10-CM

## 2019-08-08 DIAGNOSIS — Z9861 Coronary angioplasty status: Secondary | ICD-10-CM

## 2019-08-12 ENCOUNTER — Other Ambulatory Visit: Payer: Self-pay | Admitting: Family Medicine

## 2019-08-12 DIAGNOSIS — Z720 Tobacco use: Secondary | ICD-10-CM

## 2019-08-15 ENCOUNTER — Other Ambulatory Visit: Payer: Self-pay | Admitting: Family Medicine

## 2019-08-15 DIAGNOSIS — J449 Chronic obstructive pulmonary disease, unspecified: Secondary | ICD-10-CM

## 2019-08-16 ENCOUNTER — Other Ambulatory Visit (HOSPITAL_COMMUNITY)
Admission: RE | Admit: 2019-08-16 | Discharge: 2019-08-16 | Disposition: A | Discharge status: Home / Self Care | Payer: Medicaid Other | Source: Ambulatory Visit | Attending: Internal Medicine | Admitting: Internal Medicine

## 2019-08-16 DIAGNOSIS — Z01812 Encounter for preprocedural laboratory examination: Secondary | ICD-10-CM | POA: Diagnosis not present

## 2019-08-16 DIAGNOSIS — Z20828 Contact with and (suspected) exposure to other viral communicable diseases: Secondary | ICD-10-CM | POA: Diagnosis not present

## 2019-08-16 LAB — SARS CORONAVIRUS 2 (TAT 6-24 HRS): SARS Coronavirus 2: NEGATIVE

## 2019-08-19 ENCOUNTER — Ambulatory Visit (HOSPITAL_BASED_OUTPATIENT_CLINIC_OR_DEPARTMENT_OTHER): Payer: Medicaid Other | Attending: Family Medicine | Admitting: Internal Medicine

## 2019-08-19 DIAGNOSIS — G4733 Obstructive sleep apnea (adult) (pediatric): Secondary | ICD-10-CM | POA: Insufficient documentation

## 2019-08-19 DIAGNOSIS — Z9989 Dependence on other enabling machines and devices: Secondary | ICD-10-CM | POA: Diagnosis not present

## 2019-08-25 DIAGNOSIS — G4733 Obstructive sleep apnea (adult) (pediatric): Secondary | ICD-10-CM

## 2019-08-25 DIAGNOSIS — Z9989 Dependence on other enabling machines and devices: Secondary | ICD-10-CM

## 2019-08-25 NOTE — Procedures (Signed)
Patient Name: [name] [name], Podolski Date: 08/19/2019 Gender: Male D.O.B: 1961/06/10 Age (years): 58 Referring Provider: [name] Height (inches): 40 Interpreting Physician: Baird Lyons MD, ABSM Weight (lbs): 290 RPSGT: Carolin Coy BMI: 46 MRN: [id number] Neck Size: 22.50  CLINICAL INFORMATION Sleep Study Type: NPSG Indication for sleep study: Diabetes, Hypertension, Morbid Obesity, Obesity, Snoring Epworth Sleepiness Score: 9  Most recent polysomnogram dated 07/21/2015 revealed an AHI of 106.6/h and RDI of 109.1/h. Most recent titration study dated 06/27/2017 was optimal at 19cm H2O with an AHI of 29.7/h.  SLEEP STUDY TECHNIQUE As per the AASM Manual for the Scoring of Sleep and Associated Events v2.3 (April 2016) with a hypopnea requiring 4% desaturations.  The channels recorded and monitored were frontal, central and occipital EEG, electrooculogram (EOG), submentalis EMG (chin), nasal and oral airflow, thoracic and abdominal wall motion, anterior tibialis EMG, snore microphone, electrocardiogram, and pulse oximetry.  MEDICATIONS Medications self-administered by patient taken the night of the study : none reported  SLEEP ARCHITECTURE The study was initiated at 10:05:21 PM and ended at 4:54:46 AM.  Sleep onset time was 96.7 minutes and the sleep efficiency was 31.3%%. The total sleep time was 128 minutes.  Stage REM latency was N/A minutes.  The patient spent 71.9%% of the night in stage N1 sleep, 28.1%% in stage N2 sleep, 0.0%% in stage N3 and 0% in REM.  Alpha intrusion was absent.  Supine sleep was 0.00%.  RESPIRATORY PARAMETERS The overall apnea/hypopnea index (AHI) was 110.6 per hour. There were 64 total apneas, including 49 obstructive, 2 central and 13 mixed apneas. There were 172 hypopneas and 2 RERAs.  The AHI during Stage REM sleep was N/A per hour.  AHI while supine was N/A per hour.  The mean oxygen saturation was 89.8%. The minimum SpO2  during sleep was 80.0%.  moderate snoring was noted during this study.  CARDIAC DATA The 2 lead EKG demonstrated sinus rhythm. The mean heart rate was 73.7 beats per minute. Other EKG findings include: PVCs.  LEG MOVEMENT DATA The total PLMS were 0 with a resulting PLMS index of 0.0. Associated arousal with leg movement index was 0.0 .  IMPRESSIONS - Severe obstructive sleep apnea occurred during this study (AHI = 110.6/h). - Significant difficulty initiating and maintaining sleep, with insufficient early sleep to meet proocol requirements for split CPAP titration. - No significant central sleep apnea occurred during this study (CAI = 0.9/h). - Moderate oxygen desaturation was noted during this study (Min O2 = 80.0%). Mean sat 89.8%. - The patient snored with moderate snoring volume. - EKG findings include PVCs. - Clinically significant periodic limb movements did not occur during sleep. No significant associated arousals.  DIAGNOSIS - Obstructive Sleep Apnea (327.23 [G47.33 ICD-10]) - Nocturnal Hypoxemia (327.26 [G47.36 ICD-10])  RECOMMENDATIONS - Suggest CPAP titration sleep study or autopap. Other options would be based on clinical judgment. - Be careful with alcohol, sedatives and other CNS depressants that may worsen sleep apnea and disrupt normal sleep architecture. - Sleep hygiene should be reviewed to assess factors that may improve sleep quality. - Weight management and regular exercise should be initiated or continued if appropriate.  [Electronically signed] 08/25/2019 11:38 AM  Baird Lyons MD, ABSM Diplomate, American Board of Sleep Medicine   NPI: [id number]                        [place], American Board of Sleep Medicine  ELECTRONICALLY SIGNED ON:  08/25/2019, 11:35 AM [facility] PH: [phone]   FX: [phone] [place]

## 2019-08-26 ENCOUNTER — Ambulatory Visit: Payer: Medicaid Other | Admitting: Family Medicine

## 2019-08-26 ENCOUNTER — Ambulatory Visit (INDEPENDENT_AMBULATORY_CARE_PROVIDER_SITE_OTHER): Payer: Medicaid Other | Admitting: Family Medicine

## 2019-08-26 ENCOUNTER — Other Ambulatory Visit: Payer: Self-pay

## 2019-08-26 ENCOUNTER — Encounter: Payer: Self-pay | Admitting: Family Medicine

## 2019-08-26 VITALS — BP 132/60 | HR 74 | Ht 67.0 in | Wt 287.0 lb

## 2019-08-26 DIAGNOSIS — I10 Essential (primary) hypertension: Secondary | ICD-10-CM

## 2019-08-26 DIAGNOSIS — Z9989 Dependence on other enabling machines and devices: Secondary | ICD-10-CM | POA: Diagnosis not present

## 2019-08-26 DIAGNOSIS — Z9861 Coronary angioplasty status: Secondary | ICD-10-CM

## 2019-08-26 DIAGNOSIS — I2510 Atherosclerotic heart disease of native coronary artery without angina pectoris: Secondary | ICD-10-CM | POA: Diagnosis not present

## 2019-08-26 DIAGNOSIS — I209 Angina pectoris, unspecified: Secondary | ICD-10-CM | POA: Diagnosis not present

## 2019-08-26 DIAGNOSIS — J449 Chronic obstructive pulmonary disease, unspecified: Secondary | ICD-10-CM

## 2019-08-26 DIAGNOSIS — G4733 Obstructive sleep apnea (adult) (pediatric): Secondary | ICD-10-CM

## 2019-08-26 DIAGNOSIS — E119 Type 2 diabetes mellitus without complications: Secondary | ICD-10-CM

## 2019-08-26 DIAGNOSIS — Z1211 Encounter for screening for malignant neoplasm of colon: Secondary | ICD-10-CM

## 2019-08-26 LAB — POCT GLYCOSYLATED HEMOGLOBIN (HGB A1C): HbA1c, POC (controlled diabetic range): 8 % — AB (ref 0.0–7.0)

## 2019-08-26 MED ORDER — DULERA 200-5 MCG/ACT IN AERO: INHALATION_SPRAY | RESPIRATORY_TRACT | 2 refills | Status: DC

## 2019-08-26 NOTE — Assessment & Plan Note (Signed)
Stable on ASA, Effient and Lipitor. Good f/u with Cards.

## 2019-08-26 NOTE — Progress Notes (Signed)
Patient ID: [name] [name], male   DOB: 1961/07/06, 58 y.o.   MRN: [id number] Patient called back and informed me that a new referral is needed for CPAP titration. Referral placed.

## 2019-08-26 NOTE — Patient Instructions (Signed)
It was nice seeing you today. As discuss with please cut back on your Novolog to 30 units twice a day with meal and change your Lantus to 45 units daily. Continue checking your glucose at home at least three times daily. I will call you to give you more information about your sleep study and the treatment plan. I will like to see you back in about 3 months.

## 2019-08-26 NOTE — Assessment & Plan Note (Signed)
Stable and optimal BP

## 2019-08-26 NOTE — Assessment & Plan Note (Signed)
[name]

## 2019-08-26 NOTE — Assessment & Plan Note (Addendum)
Poorly controlled DM. Likely due to poor meds adherence. He uses his insulin different from what was recommended. He is mostly concerned about hypoglycemia. I am seeing him today for the first time and I am not clear either what he should be on. I discussed with Dr. Valentina Lucks who also saw him today. Plan is to change Novolog to 30 units BID and Lantus to 45 units daily. Dr. Valentina Lucks will f/u with him in few weeks. Titrate insulin up as needed. He agreed with the plan.

## 2019-08-26 NOTE — Addendum Note (Signed)
Addended by: Andrena Mews T on: 08/26/2019 03:33 PM   Modules accepted: Orders

## 2019-08-26 NOTE — Progress Notes (Signed)
  Subjective:     Patient ID: [name] [name], male   DOB: 1961-02-06, 58 y.o.   MRN: [id number]  HPI DM2: He is here for follow-up. He endorsed taking Lantus 45 units daily (per record, he should take 45 units BID),Metformin 1000 mg BID, Victoza 1.8 mg qd, Jardiance 25 mg qd and Novolog 45 units dialy (record indicates he should take 45 BID). He reports frequent hypoglycemia with his regimen with his last episode 8 days abd a CBG value of 98. No other concerns today. HTN: Compliant with meds. [id number] to f/u with his result. COPD: Denies any new concerns. He need refill of his Dulera. CAD: Compliant with meds and Cards F/U. [id number] had colonoscopy.  Vitals:   08/26/19 1134  BP: 132/60  Pulse: 74  SpO2: 95%  Weight: 287 lb (130.2 kg)  Height: 5\' 7"  (1.702 m)     Review of Systems  Respiratory: Negative.   Cardiovascular: Negative.   Gastrointestinal: Negative.   Genitourinary: Negative.   Musculoskeletal: Negative.   Neurological: Negative.   All other systems reviewed and are negative.      Objective:   Physical Exam Vitals and nursing note reviewed.  Constitutional:      Appearance: He is obese.  Cardiovascular:     Rate and Rhythm: Normal rate and regular rhythm.     Heart sounds: Normal heart sounds. No murmur.  Pulmonary:     Effort: Pulmonary effort is normal. No respiratory distress.     Breath sounds: Normal breath sounds. No wheezing.  Abdominal:     General: Abdomen is flat. Bowel sounds are normal.     Palpations: Abdomen is soft. There is no mass.     Tenderness: There is no abdominal tenderness.     Comments: Obese  Musculoskeletal:     Right lower leg: No edema.     Left lower leg: No edema.  Neurological:     Mental Status: He is alert and oriented to person, place, and time.        Assessment:     DM2 HTN CAD COPD OSA Health maintenance    Plan:     Check problem list.  For HM, I discussed colonoscopy. He is ok with referral.

## 2019-08-26 NOTE — Assessment & Plan Note (Signed)
Stable

## 2019-08-26 NOTE — Assessment & Plan Note (Signed)
Sleep study reviewed and discussed with her. Weight loss plan discussed as well as sleep hygiene. Per the patient, he was asked to return in 3 months for repeat test. Per report, he need to titrate his CPAP machine. I advised him to call Dr. Janee Morn office to schedule titration appointment. Message also sent to our staff to help him with scheduling.

## 2019-08-30 ENCOUNTER — Telehealth: Payer: Self-pay | Admitting: Family Medicine

## 2019-08-30 NOTE — Chronic Care Management (AMB) (Signed)
Care Management   Note  08/30/2019 Name: [name] [name] MRN: [id number] DOB: 02/11/1961  [name] [name] is a 58 y.o. year old male who is a primary care patient of Shawnie Pons Shelbie Proctor, MD. I reached out to [name] by phone today in response to a referral sent by [name] [name] health plan.    [name] [name] was given information about care management services today including:  1. Care management services include personalized support from designated clinical staff supervised by his physician, including individualized plan of care and coordination with other care providers 2. 24/7 contact phone numbers for assistance for urgent and routine care needs. 3. The patient may stop care management services at any time by phone call to the office staff.  Patient agreed to services and verbal consent obtained.   Follow up plan: Telephone appointment with CCM team member scheduled for:09/03/2019  Elisha Ponder, LPN Health Advisor, Embedded Care Coordination [facility] Health Care Management ??[email or web address] ??[phone]

## 2019-09-03 ENCOUNTER — Other Ambulatory Visit: Payer: Self-pay

## 2019-09-03 ENCOUNTER — Telehealth: Payer: Medicaid Other

## 2019-09-05 ENCOUNTER — Other Ambulatory Visit: Payer: Self-pay | Admitting: Family Medicine

## 2019-09-05 DIAGNOSIS — I10 Essential (primary) hypertension: Secondary | ICD-10-CM

## 2019-09-05 DIAGNOSIS — I214 Non-ST elevation (NSTEMI) myocardial infarction: Secondary | ICD-10-CM

## 2019-09-09 ENCOUNTER — Telehealth: Payer: Self-pay | Admitting: Family Medicine

## 2019-09-09 NOTE — Telephone Encounter (Signed)
Pt saw Dr. Gwendlyn Deutscher on 08/26/19 and said that she was going to send a referral to [facility]. He called and spoke with them and they have not received referral.   Pt would like to know if this referral could be placed.

## 2019-09-09 NOTE — Telephone Encounter (Signed)
Referral has been placed with pulmonology. Will have referral coordinator check into this.  Kaylena Pacifico,CMA

## 2019-09-11 ENCOUNTER — Other Ambulatory Visit: Payer: Self-pay | Admitting: Family Medicine

## 2019-09-11 DIAGNOSIS — I2510 Atherosclerotic heart disease of native coronary artery without angina pectoris: Secondary | ICD-10-CM

## 2019-09-11 DIAGNOSIS — I10 Essential (primary) hypertension: Secondary | ICD-10-CM

## 2019-09-26 ENCOUNTER — Other Ambulatory Visit: Payer: Self-pay

## 2019-09-26 ENCOUNTER — Telehealth (HOSPITAL_BASED_OUTPATIENT_CLINIC_OR_DEPARTMENT_OTHER): Payer: Medicaid Other | Admitting: Pharmacist

## 2019-09-26 DIAGNOSIS — E1159 Type 2 diabetes mellitus with other circulatory complications: Secondary | ICD-10-CM

## 2019-09-26 DIAGNOSIS — Z794 Long term (current) use of insulin: Secondary | ICD-10-CM | POA: Diagnosis not present

## 2019-09-26 NOTE — Patient Instructions (Signed)
No changes  We will communicate need for sleep study to Dr. Gwendlyn Deutscher.  Several refills were sent in for you.

## 2019-09-26 NOTE — Progress Notes (Signed)
Referral was placed. I will have CMA or CCM contact him regarding his referral.

## 2019-09-26 NOTE — Assessment & Plan Note (Signed)
Diabetes longstanding currently decently controlled and close to HbA1c goal. Patient is adherent with medication. -Continue basal insulin Lantus (insulin glargine) 30 units daily. (This dose is much less than previous but patient reports good control with increase in exercise) -Continue rapid insulin Novolog (insulin aspart) to 45 units twice daily -Continued GLP-1 Victoza (generic name liraglutide) 1.8 mg daily -Continued empagliflozin 25 mg daily -Extensively discussed pathophysiology of diabetes, recommended lifestyle interventions, dietary effects on blood sugar control -Counseled on s/sx of and management of hypoglycemia -Next A1C anticipated 6-12 months

## 2019-09-26 NOTE — Progress Notes (Signed)
    S:     Chief Complaint  Patient presents with  . Medication Management    DM/smoking cessation    Patient contacted via telephone for follow up appt.  Presents for diabetes evaluation, education, and management Patient was referred and last seen by Primary Care Provider on 05/24/2019.   Patient was last seen in Rx clinic on 08/01/2019.  Insurance coverage/medication affordability: Medicaid.  Patient reports adherence with medications.  Current diabetes medications include: metformin 1 gram BID, Victoza 1.8 mg daily, Lantus 30 units once daily (taking less than previously prescribed), Novolog 45 units twice daily Current hypertension medications include: carvedilol 3.125mg  bid and lisinopril 2.5mg  daily. Current hyperlipidemia medications include: atorvastatin 80mg   Patient denies hypoglycemic events.  Patient reported dietary habits:  Drinks: gatorade zero, water, [place] dry  Patient-reported exercise habits: riding bike 2 miles and walking 1/2 mile every other day   Patient reports nocturia (nighttime urination) 3-4x per night     O:  Physical Exam  - N/A (telephone appt)   ROS - N/A (telephone appt)   Lab Results  Component Value Date   HGBA1C 8.0 (A) 08/26/2019     Lipid Panel     Component Value Date/Time   CHOL 94 (L) 05/09/2019 1537   TRIG 143 05/09/2019 1537   HDL 33 (L) 05/09/2019 1537   CHOLHDL 2.8 05/09/2019 1537   CHOLHDL 3.7 03/29/2016 1227   VLDL 53 (H) 03/29/2016 1227   LDLCALC 32 05/09/2019 1537   Checking once per day Home fasting blood sugars: 108, 150, 175, 160, 132, 148   Clinical Atherosclerotic Cardiovascular Disease (ASCVD): Yes  The ASCVD Risk score Mikey Bussing DC Jr., et al., 2013) failed to calculate for the following reasons:   The patient has a prior MI or stroke diagnosis    A/P: Diabetes longstanding currently decently controlled and close to HbA1c goal. Patient is adherent with medication. -Continue basal insulin Lantus  (insulin glargine) 30 units daily. (This dose is much less than previous but patient reports good control with increase in exercise) -Continue rapid insulin Novolog (insulin aspart) to 45 units twice daily -Continued GLP-1 Victoza (generic name liraglutide) 1.8 mg daily -Continued empagliflozin 25 mg daily -Extensively discussed pathophysiology of diabetes, recommended lifestyle interventions, dietary effects on blood sugar control -Counseled on s/sx of and management of hypoglycemia -Next A1C anticipated 6-12 months  History of chronic tobacco abuse - patient continues to abstain from smoking (quit > 1.5 years ago).  Congratulated and encouraged continued abstinence.  He appears to be QUIT - long-term.   ASCVD risk - secondary prevention in patient with diabetes. Last LDL is controlled. High intensity statin is indicated. Aspirin is indicated. -Continue aspirin 81 mg daily -Continue atorvastatin 80 mg daily  Hypertension longstanding currently controlled based on prior office vitals.  Blood pressure goal = <130/80  mmHg. Patient medication adherence is optimal.   -Continue carvedilol 3.125 mg BID  Total time in face to face counseling 30 minutes.   Follow up PCP   Clinic Visit in 1-2 weeks. I will plan to follow up with him again in 6 weeks.   Patient contacted via telephone by Drexel Iha, PharmD, PGY2 [facility] pharmacy resident and Dr. Valentina Lucks

## 2019-09-26 NOTE — Progress Notes (Signed)
Noted and agree. 

## 2019-10-01 ENCOUNTER — Other Ambulatory Visit: Payer: Self-pay | Admitting: Family Medicine

## 2019-10-01 DIAGNOSIS — Z720 Tobacco use: Secondary | ICD-10-CM

## 2019-10-08 ENCOUNTER — Other Ambulatory Visit: Payer: Self-pay | Admitting: Family Medicine

## 2019-10-08 DIAGNOSIS — Z9861 Coronary angioplasty status: Secondary | ICD-10-CM

## 2019-10-08 DIAGNOSIS — I10 Essential (primary) hypertension: Secondary | ICD-10-CM

## 2019-10-08 DIAGNOSIS — I2510 Atherosclerotic heart disease of native coronary artery without angina pectoris: Secondary | ICD-10-CM

## 2019-10-08 NOTE — Progress Notes (Signed)
10/17/2019 [name] [name]   1960/11/16  [id number]  Primary Physician Lind Covert, MD Primary Cardiologist: [name]  HPI:  [name] [name] is seen today for follow up CAD. His past medical history is significant for CAD, DM, obesity, tobacco use , hyperlipidemia and hypertension. He is status post stenting of the right coronary artery in May of 2013. He was admitted with a NSTEMI inFebruary of 2015. He was found to have severe disease in LCx and OM2 treated with BMS.  He was placed on dual antiplatelet therapy with aspirin plus Effient.   He presented with increased angina in October 2015. A Myoview study was intermediate risk.  He had repeat cardiac cath in November 2015.  He was found to have severe single-vessel disease involving the bifurcation of OM1 and OM 2 with 99% in-stent restenosis in the bare-metal stent placed in the OM 2. He underwent successful PCI spanning the proximal circumflex stent across OM 1 through the bare-metal stent placed in OM 2 with a Xience alpine DES stent. He was also noted to have a widely patent stent in the RCA and proximal circumflex with otherwise mild-moderate disease. He has preserved LVEF with an ejection fraction of 55-65%. He was instructed to continue with DAPT with ASA + Effient.   On follow up today he reports he is doing  well. He denies any chest pain or SOB. He is not smoking.  He is exercising regularly at [employer] and has lost 7 lbs. Admits he ate too much over the holidays.   Current Outpatient Medications  Medication Sig Dispense Refill  . ACCU-CHEK FASTCLIX LANCETS MISC 1 Units by Percutaneous route 4 (four) times daily. 100 each 12  . albuterol (VENTOLIN HFA) 108 (90 Base) MCG/ACT inhaler INHALE TWO PUFFS BY MOUTH EVERY 6 HOURS AS NEEDED FOR WHEEZING OR SHORTNESS OF BREATH 6.7 g 0  . amoxicillin-clavulanate (AUGMENTIN) 875-125 MG tablet Take 1 tablet by mouth 2 (two) times daily. 20 tablet 0  . aspirin 81 MG chewable tablet Chew 1  tablet (81 mg total) by mouth daily. 180 tablet 2  . atorvastatin (LIPITOR) 80 MG tablet TAKE ONE TABLET BY MOUTH DAILY 90 tablet 2  . BD INSULIN SYRINGE U/F 31G X 5/16" 1 ML MISC USE UP TO FIVE TIME DAILY WITH NOVOLOG AND LANTUS DOSING 100 each 2  . BD ULTRA-FINE PEN NEEDLES 29G X 12.7MM MISC USE TO WITH VICTOZA PEN, INJECTION ONCE DAILY. 100 each 9  . Blood Glucose Monitoring Suppl (ACCU-CHEK AVIVA PLUS) w/Device KIT CHECK BLOOD GLUCOSE FASTING AND 2 HOURS AFTER [employer] [facility] AND DINNER 1 kit 0  . buPROPion (WELLBUTRIN XL) 150 MG 24 hr tablet TAKE ONE TABLET BY MOUTH DAILY 30 tablet 0  . carvedilol (COREG) 3.125 MG tablet TAKE ONE TABLET BY MOUTH TWICE A DAY WITH MEALS 60 tablet 0  . ciprofloxacin-dexamethasone (CIPRODEX) OTIC suspension Place 4 drops into the left ear 2 (two) times daily.    . citalopram (CELEXA) 20 MG tablet Take 1 tablet (20 mg total) by mouth daily. 90 tablet 1  . empagliflozin (JARDIANCE) 25 MG TABS tablet Take 25 mg by mouth daily. 90 tablet 3  . glucose blood (ACCU-CHEK AVIVA PLUS) test strip USE ONE STRIP TO TEST FOUR TIMES A DAY 300 each 11  . insulin aspart (NOVOLOG) 100 UNIT/ML injection Inject 45 Units into the skin 2 (two) times daily with a meal.    . insulin glargine (LANTUS) 100 UNIT/ML injection Inject 0.45 mLs (45 Units total)  10/17/2019 [name] [name]   1960/11/16  [id number]  Primary Physician Lind Covert, MD Primary Cardiologist: [name]  HPI:  [name] [name] is seen today for follow up CAD. His past medical history is significant for CAD, DM, obesity, tobacco use , hyperlipidemia and hypertension. He is status post stenting of the right coronary artery in May of 2013. He was admitted with a NSTEMI inFebruary of 2015. He was found to have severe disease in LCx and OM2 treated with BMS.  He was placed on dual antiplatelet therapy with aspirin plus Effient.   He presented with increased angina in October 2015. A Myoview study was intermediate risk.  He had repeat cardiac cath in November 2015.  He was found to have severe single-vessel disease involving the bifurcation of OM1 and OM 2 with 99% in-stent restenosis in the bare-metal stent placed in the OM 2. He underwent successful PCI spanning the proximal circumflex stent across OM 1 through the bare-metal stent placed in OM 2 with a Xience alpine DES stent. He was also noted to have a widely patent stent in the RCA and proximal circumflex with otherwise mild-moderate disease. He has preserved LVEF with an ejection fraction of 55-65%. He was instructed to continue with DAPT with ASA + Effient.   On follow up today he reports he is doing  well. He denies any chest pain or SOB. He is not smoking.  He is exercising regularly at [employer] and has lost 7 lbs. Admits he ate too much over the holidays.   Current Outpatient Medications  Medication Sig Dispense Refill  . ACCU-CHEK FASTCLIX LANCETS MISC 1 Units by Percutaneous route 4 (four) times daily. 100 each 12  . albuterol (VENTOLIN HFA) 108 (90 Base) MCG/ACT inhaler INHALE TWO PUFFS BY MOUTH EVERY 6 HOURS AS NEEDED FOR WHEEZING OR SHORTNESS OF BREATH 6.7 g 0  . amoxicillin-clavulanate (AUGMENTIN) 875-125 MG tablet Take 1 tablet by mouth 2 (two) times daily. 20 tablet 0  . aspirin 81 MG chewable tablet Chew 1  tablet (81 mg total) by mouth daily. 180 tablet 2  . atorvastatin (LIPITOR) 80 MG tablet TAKE ONE TABLET BY MOUTH DAILY 90 tablet 2  . BD INSULIN SYRINGE U/F 31G X 5/16" 1 ML MISC USE UP TO FIVE TIME DAILY WITH NOVOLOG AND LANTUS DOSING 100 each 2  . BD ULTRA-FINE PEN NEEDLES 29G X 12.7MM MISC USE TO WITH VICTOZA PEN, INJECTION ONCE DAILY. 100 each 9  . Blood Glucose Monitoring Suppl (ACCU-CHEK AVIVA PLUS) w/Device KIT CHECK BLOOD GLUCOSE FASTING AND 2 HOURS AFTER [employer] [facility] AND DINNER 1 kit 0  . buPROPion (WELLBUTRIN XL) 150 MG 24 hr tablet TAKE ONE TABLET BY MOUTH DAILY 30 tablet 0  . carvedilol (COREG) 3.125 MG tablet TAKE ONE TABLET BY MOUTH TWICE A DAY WITH MEALS 60 tablet 0  . ciprofloxacin-dexamethasone (CIPRODEX) OTIC suspension Place 4 drops into the left ear 2 (two) times daily.    . citalopram (CELEXA) 20 MG tablet Take 1 tablet (20 mg total) by mouth daily. 90 tablet 1  . empagliflozin (JARDIANCE) 25 MG TABS tablet Take 25 mg by mouth daily. 90 tablet 3  . glucose blood (ACCU-CHEK AVIVA PLUS) test strip USE ONE STRIP TO TEST FOUR TIMES A DAY 300 each 11  . insulin aspart (NOVOLOG) 100 UNIT/ML injection Inject 45 Units into the skin 2 (two) times daily with a meal.    . insulin glargine (LANTUS) 100 UNIT/ML injection Inject 0.45 mLs (45 Units total)  10/17/2019 [name] [name]   1960/11/16  [id number]  Primary Physician Lind Covert, MD Primary Cardiologist: [name]  HPI:  [name] [name] is seen today for follow up CAD. His past medical history is significant for CAD, DM, obesity, tobacco use , hyperlipidemia and hypertension. He is status post stenting of the right coronary artery in May of 2013. He was admitted with a NSTEMI inFebruary of 2015. He was found to have severe disease in LCx and OM2 treated with BMS.  He was placed on dual antiplatelet therapy with aspirin plus Effient.   He presented with increased angina in October 2015. A Myoview study was intermediate risk.  He had repeat cardiac cath in November 2015.  He was found to have severe single-vessel disease involving the bifurcation of OM1 and OM 2 with 99% in-stent restenosis in the bare-metal stent placed in the OM 2. He underwent successful PCI spanning the proximal circumflex stent across OM 1 through the bare-metal stent placed in OM 2 with a Xience alpine DES stent. He was also noted to have a widely patent stent in the RCA and proximal circumflex with otherwise mild-moderate disease. He has preserved LVEF with an ejection fraction of 55-65%. He was instructed to continue with DAPT with ASA + Effient.   On follow up today he reports he is doing  well. He denies any chest pain or SOB. He is not smoking.  He is exercising regularly at [employer] and has lost 7 lbs. Admits he ate too much over the holidays.   Current Outpatient Medications  Medication Sig Dispense Refill  . ACCU-CHEK FASTCLIX LANCETS MISC 1 Units by Percutaneous route 4 (four) times daily. 100 each 12  . albuterol (VENTOLIN HFA) 108 (90 Base) MCG/ACT inhaler INHALE TWO PUFFS BY MOUTH EVERY 6 HOURS AS NEEDED FOR WHEEZING OR SHORTNESS OF BREATH 6.7 g 0  . amoxicillin-clavulanate (AUGMENTIN) 875-125 MG tablet Take 1 tablet by mouth 2 (two) times daily. 20 tablet 0  . aspirin 81 MG chewable tablet Chew 1  tablet (81 mg total) by mouth daily. 180 tablet 2  . atorvastatin (LIPITOR) 80 MG tablet TAKE ONE TABLET BY MOUTH DAILY 90 tablet 2  . BD INSULIN SYRINGE U/F 31G X 5/16" 1 ML MISC USE UP TO FIVE TIME DAILY WITH NOVOLOG AND LANTUS DOSING 100 each 2  . BD ULTRA-FINE PEN NEEDLES 29G X 12.7MM MISC USE TO WITH VICTOZA PEN, INJECTION ONCE DAILY. 100 each 9  . Blood Glucose Monitoring Suppl (ACCU-CHEK AVIVA PLUS) w/Device KIT CHECK BLOOD GLUCOSE FASTING AND 2 HOURS AFTER [employer] [facility] AND DINNER 1 kit 0  . buPROPion (WELLBUTRIN XL) 150 MG 24 hr tablet TAKE ONE TABLET BY MOUTH DAILY 30 tablet 0  . carvedilol (COREG) 3.125 MG tablet TAKE ONE TABLET BY MOUTH TWICE A DAY WITH MEALS 60 tablet 0  . ciprofloxacin-dexamethasone (CIPRODEX) OTIC suspension Place 4 drops into the left ear 2 (two) times daily.    . citalopram (CELEXA) 20 MG tablet Take 1 tablet (20 mg total) by mouth daily. 90 tablet 1  . empagliflozin (JARDIANCE) 25 MG TABS tablet Take 25 mg by mouth daily. 90 tablet 3  . glucose blood (ACCU-CHEK AVIVA PLUS) test strip USE ONE STRIP TO TEST FOUR TIMES A DAY 300 each 11  . insulin aspart (NOVOLOG) 100 UNIT/ML injection Inject 45 Units into the skin 2 (two) times daily with a meal.    . insulin glargine (LANTUS) 100 UNIT/ML injection Inject 0.45 mLs (45 Units total)

## 2019-10-09 ENCOUNTER — Other Ambulatory Visit: Payer: Self-pay | Admitting: Podiatry

## 2019-10-09 ENCOUNTER — Ambulatory Visit (INDEPENDENT_AMBULATORY_CARE_PROVIDER_SITE_OTHER): Payer: Medicaid Other

## 2019-10-09 ENCOUNTER — Encounter: Payer: Self-pay | Admitting: Podiatry

## 2019-10-09 ENCOUNTER — Other Ambulatory Visit: Payer: Self-pay

## 2019-10-09 ENCOUNTER — Ambulatory Visit: Payer: Medicaid Other | Admitting: Podiatry

## 2019-10-09 VITALS — Temp 99.2°F

## 2019-10-09 DIAGNOSIS — B351 Tinea unguium: Secondary | ICD-10-CM | POA: Diagnosis not present

## 2019-10-09 DIAGNOSIS — M79674 Pain in right toe(s): Secondary | ICD-10-CM

## 2019-10-09 DIAGNOSIS — S90851A Superficial foreign body, right foot, initial encounter: Secondary | ICD-10-CM

## 2019-10-09 DIAGNOSIS — Z794 Long term (current) use of insulin: Secondary | ICD-10-CM

## 2019-10-09 DIAGNOSIS — M79675 Pain in left toe(s): Secondary | ICD-10-CM

## 2019-10-09 DIAGNOSIS — E119 Type 2 diabetes mellitus without complications: Secondary | ICD-10-CM

## 2019-10-09 MED ORDER — MUPIROCIN 2 % EX OINT: TOPICAL_OINTMENT | CUTANEOUS | 1 refills | Status: DC

## 2019-10-09 MED ORDER — AMOXICILLIN-POT CLAVULANATE 875-125 MG PO TABS: 1.0000 | ORAL_TABLET | Freq: Two times a day (BID) | ORAL | 0 refills | Status: DC

## 2019-10-09 NOTE — Patient Instructions (Addendum)
  Call your primary care physician for an appointment for a Tetanus shot   Take Augmentin 875 mg by mouth twice a day for 10 days     DRESSING CHANGES RIGHT FOOT:    1. KEEP RIGHT  FOOT DRY AT ALL TIMES!!!!  2. CLEANSE ULCER WITH SALINE.  3. DAB DRY WITH GAUZE SPONGE.  4. APPLY A LIGHT AMOUNT OF MUPIROCIN TO RIGHT FOOT WOUND.  5. APPLY OUTER DRESSING/BAND-AID AS INSTRUCTED.  7. DO NOT WALK BAREFOOT!!!  8.  IF YOU EXPERIENCE ANY FEVER, CHILLS, NIGHTSWEATS, NAUSEA OR VOMITING, ELEVATED OR LOW BLOOD SUGARS, REPORT TO EMERGENCY ROOM.  9. IF YOU EXPERIENCE INCREASED REDNESS, PAIN, SWELLING, DISCOLORATION, ODOR, PUS, DRAINAGE OR WARMTH OF YOUR FOOT, REPORT TO EMERGENCY ROOM.

## 2019-10-10 NOTE — Progress Notes (Addendum)
Subjective: [name] [name] presents today for preventative diabetic foot care. Pt is seen for routine treatment of painful mycotic toenails.   He states he has a new problem on today. Pt states about one week ago, he stepped on a piece of wood. He admits walking barefoot indoors. His sister was able to get a piece of the wood out. He still feels there is more inside of his foot. He denies any drainage, fever, chills, nightsweats, nausea or vomiting. He has not cleaned or covered the area.   He states it has been "a long time" since he's had his tetanus updated.    Past Medical History:  Diagnosis Date  . Alcoholism ([place])   . Anxiety   . Arthritis    "qwhere"  . Asthma   . CAD S/P percutaneous coronary angioplasty 01/18/12; 10/2013   a. pRCA 3.5 x 18 vision BMS - 4.2 mm; b. 2/'15: mCx 3.5 x 12  Rebel BMS (3.6-3.7 mm)  . Chronic back pain    "mid/lower" (08/01/2014)  . COPD (chronic obstructive pulmonary disease) ([place])    "I'm seeing COPD dr now; don't know if I've got it" (08/01/2014)  . Depression   . GERD (gastroesophageal reflux disease)   . Hyperlipidemia   . Hypertension   . Migraines    "once in awhile" (08/01/2014)  . Non-ST elevation myocardial infarction (NSTEMI) ([place]) 10/2013  . OSA on CPAP 01/19/2012  . Type II diabetes mellitus [facility])      Patient Active Problem List   Diagnosis Date Noted  . Mild nonproliferative diabetic retinopathy of both eyes without macular edema associated with type 2 diabetes mellitus ([place]) 04/30/2019  . Primary insomnia 02/25/2019  . Tinea pedis of both feet 08/23/2017  . Depression, acute 02/17/2016  . Hearing difficulty 12/10/2014  . COPD, moderate ([place]) 11/22/2014  . Angina, class II ([place]) 08/01/2014  . H/O NSTEMI (non-ST elevated myocardial infarction) 11/07/2013  . Pre-ulcerative corn or callous 10/12/2012  . Dental caries 10/12/2012  . Back pain 08/24/2012  . History of tobacco use 08/24/2012  . Obesity 07/31/2012  . DM type 2 (diabetes  mellitus, type 2) ([place]) 06/05/2012  . CAD S/P percutaneous coronary angioplasty 01/21/2012  . OSA on CPAP 01/19/2012  . Essential hypertension 01/18/2012  . Hyperlipidemia with target LDL less than 70 01/18/2012     Past Surgical History:  Procedure Laterality Date  . CARDIAC CATHETERIZATION  07/2014   Left Main: Short, large-caliber vessel. Widely patent. Bifurcates into the LAD and Circumflex. Angiographically normal.  . CORONARY ANGIOPLASTY WITH STENT PLACEMENT  01/2012; 11/07/2013; 08/01/2014   "1 + 2 + 1"   . CORONARY ANGIOPLASTY WITH STENT PLACEMENT  07/2014   Severe single-vessel disease involving the bifurcation of OM1 and OM 2 with 99% in-stent restenosis in the bare-metal stent placed in the OM 2.  . LEFT HEART CATHETERIZATION WITH CORONARY ANGIOGRAM N/A 01/20/2012   Procedure: LEFT HEART CATHETERIZATION WITH CORONARY ANGIOGRAM;  Surgeon: Jettie Booze, MD;  Location: [facility] CATH LAB;  Service: Cardiovascular;  Laterality: N/A;  . LEFT HEART CATHETERIZATION WITH CORONARY ANGIOGRAM N/A 11/07/2013   Procedure: LEFT HEART CATHETERIZATION WITH CORONARY ANGIOGRAM;  Surgeon: Jettie Booze, MD;  Location: [facility] CATH LAB;  Service: Cardiovascular;  Laterality: N/A;  . LEFT HEART CATHETERIZATION WITH CORONARY ANGIOGRAM N/A 08/01/2014   Procedure: LEFT HEART CATHETERIZATION WITH CORONARY ANGIOGRAM;  Surgeon: Leonie Man, MD;  Location: [facility] CATH LAB;  Service: Cardiovascular;  Laterality: N/A;  . PERCUTANEOUS CORONARY STENT INTERVENTION (  PCI-S)  01/20/2012   Procedure: PERCUTANEOUS CORONARY STENT INTERVENTION (PCI-S);  Surgeon: Jettie Booze, MD;  Location: [facility] CATH LAB;  Service: Cardiovascular;;  . PERCUTANEOUS CORONARY STENT INTERVENTION (PCI-S)  11/07/2013   Procedure: PERCUTANEOUS CORONARY STENT INTERVENTION (PCI-S);  Surgeon: Jettie Booze, MD;  Location: [facility] CATH LAB;  Service: Cardiovascular;;    Current Outpatient Medications on File Prior to Visit  Medication Sig  Dispense Refill  . ACCU-CHEK FASTCLIX LANCETS MISC 1 Units by Percutaneous route 4 (four) times daily. 100 each 12  . albuterol (VENTOLIN HFA) 108 (90 Base) MCG/ACT inhaler INHALE TWO PUFFS BY MOUTH EVERY 6 HOURS AS NEEDED FOR WHEEZING OR SHORTNESS OF BREATH 6.7 g 0  . aspirin 81 MG chewable tablet Chew 1 tablet (81 mg total) by mouth daily. 180 tablet 2  . atorvastatin (LIPITOR) 80 MG tablet TAKE ONE TABLET BY MOUTH DAILY 90 tablet 2  . BD INSULIN SYRINGE U/F 31G X 5/16" 1 ML MISC USE UP TO FIVE TIME DAILY WITH NOVOLOG AND LANTUS DOSING 100 each 2  . BD ULTRA-FINE PEN NEEDLES 29G X 12.7MM MISC USE TO WITH VICTOZA PEN, INJECTION ONCE DAILY. 100 each 9  . Blood Glucose Monitoring Suppl (ACCU-CHEK AVIVA PLUS) w/Device KIT CHECK BLOOD GLUCOSE FASTING AND 2 HOURS AFTER [facility] AND DINNER 1 kit 0  . buPROPion (WELLBUTRIN XL) 150 MG 24 hr tablet TAKE ONE TABLET BY MOUTH DAILY 30 tablet 0  . carvedilol (COREG) 3.125 MG tablet TAKE ONE TABLET BY MOUTH TWICE A DAY WITH MEALS 60 tablet 0  . ciprofloxacin-dexamethasone (CIPRODEX) OTIC suspension Place 4 drops into the left ear 2 (two) times daily.    . citalopram (CELEXA) 20 MG tablet Take 1 tablet (20 mg total) by mouth daily. 90 tablet 1  . empagliflozin (JARDIANCE) 25 MG TABS tablet Take 25 mg by mouth daily. 90 tablet 3  . glucose blood (ACCU-CHEK AVIVA PLUS) test strip USE ONE STRIP TO TEST FOUR TIMES A DAY 300 each 11  . insulin aspart (NOVOLOG) 100 UNIT/ML injection Inject 45 Units into the skin 2 (two) times daily with a meal.    . insulin glargine (LANTUS) 100 UNIT/ML injection Inject 0.45 mLs (45 Units total) into the skin 2 (two) times daily.    Marland Kitchen liraglutide (VICTOZA) 18 MG/3ML SOPN INJECT 0.3 MLS (1.8 MG TOTAL) INTO THE SKIN DAILY 9 pen 3  . lisinopril (ZESTRIL) 2.5 MG tablet Take 1 tablet (2.5 mg total) by mouth daily.    . Melatonin 2.5 MG CAPS Take 5 mg by mouth at bedtime.    . metFORMIN (GLUCOPHAGE) 1000 MG tablet TAKE ONE TABLET BY  MOUTH TWICE A DAY WITH A MEAL 180 tablet 10  . mometasone-formoterol (DULERA) 200-5 MCG/ACT AERO INHALE TWO PUFFS BY MOUTH TWICE A DAY 39 g 2  . Multiple Vitamin (MULTIVITAMIN) tablet Take 1 tablet by mouth daily.    . nitroGLYCERIN (NITROSTAT) 0.4 MG SL tablet DISSOLVE 1 TAB UNDER TONGUE FOR CHEST PAIN - IF PAIN REMAINS AFTER 5 MIN, CALL 911 AND REPEAT DOSE. MAX 3 TABS IN 15 MINUTES 100 tablet 0  . Omega-3 Fatty Acids (FISH OIL) 1000 MG CAPS Take 1 capsule (1,000 mg total) by mouth daily.    . prasugrel (EFFIENT) 10 MG TABS tablet TAKE ONE TABLET BY MOUTH DAILY 90 tablet 2  . traMADol (ULTRAM) 50 MG tablet TAKE ONE TABLET BY MOUTH EVERY 6 HOURS AS NEEDED (Patient not taking: Reported on 08/26/2019) 20 tablet 0  . [DISCONTINUED] Blood  Glucose Monitoring Suppl (ACCU-CHEK AVIVA PLUS) w/Device KIT CHECK BLOOD GLUCOSE FASTING AND 2 HOURS AFTER BREAKFAST, LUNCH AND DINNER 1 kit 0   No current facility-administered medications on file prior to visit.    Allergies  Allergen Reactions  . Shellfish Allergy Nausea And Vomiting    OYSTERS     Social History   Occupational History  . Occupation: Unemployed    Comment: Used to work for [employer]  . Occupation: Ship broker, Human resources officer  Tobacco Use  . Smoking status: Former Smoker    Packs/day: 1.00    Years: 42.00    Pack years: 42.00    Types: Cigarettes    Start date: 09/13/1975    Quit date: 01/25/2018    Years since quitting: 1.7  . Smokeless tobacco: Never Used  . Tobacco comment: Bupropion helping  Substance and Sexual Activity  . Alcohol use: Yes    Alcohol/week: 6.0 standard drinks    Types: 6 Cans of beer per week    Comment: pt reports "I quit 66 days ago" (05/09/16)  . Drug use: No    Comment: Crack Cocaine (denies 05/09/16)  . Sexual activity: Not Currently     Family History  Problem Relation Age of Onset  . Heart attack Father 97  . Breast cancer Mother   . Hypertension Sister   . Hyperlipidemia Sister   .  Hyperlipidemia Sister   . Hypertension Sister   . Heart attack Brother   . Stroke Brother   . Diabetes Brother   . Diabetes Sister      Immunization History  Administered Date(s) Administered  . Influenza Split 08/24/2012  . Influenza,inj,Quad PF,6+ Mos 07/31/2013, 07/08/2014, 05/27/2015, 05/23/2016, 06/07/2017, 05/24/2018, 05/24/2019  . Pneumococcal Polysaccharide-23 01/19/2012, 03/01/2017  . Tdap 08/24/2012     Review of systems: Positive Findings in bold print.  Constitutional:  chills, fatigue, fever, sweats, weight change Communication: Optometrist, sign Ecologist, hand writing, iPad/Android device Head: head injury Eyes: changes in vision, eye pain, glaucoma, cataracts, macular degeneration, diplopia, glare, light sensitivity, eyeglasses or contacts, blindness Ears nose mouth throat: hearing impaired, hearing aids,  ringing in ears, deaf, sign language,  vertigo, nosebleeds,  rhinitis,  cold sores, snoring, swollen glands Cardiovascular: HTN, edema, arrhythmia, pacemaker in place, defibrillator in place, chest pain/tightness, chronic anticoagulation, blood clot, heart failure, MI Peripheral Vascular: leg cramps, varicose veins, blood clots, lymphedema, varicosities Respiratory:  asthma, difficulty breathing, denies congestion, SOB, wheezing, cough, emphysema, oxygen dependent Gastrointestinal: change in appetite or weight, abdominal pain, constipation, diarrhea, nausea, vomiting, vomiting blood, change in bowel habits, abdominal pain, jaundice, rectal bleeding, hemorrhoids, GERD Genitourinary:  nocturia,  pain on urination, polyuria,  blood in urine, Foley catheter, urinary urgency, ESRD on hemodialysis Musculoskeletal: amputation(s), cramping, stiff joints, painful joints, decreased joint motion, fractures, OA, gout, hemiplegia, paraplegia, uses cane, wheelchair bound, uses walker, uses rollator Skin: +changes in toenails, color change, dryness, itching, mole changes,   rash, wound(s) Neurological: headaches, numbness in feet, paresthesias in feet, burning in feet, fainting,  seizures, change in speech. denies headaches, memory problems/poor historian, cerebral palsy, weakness, paralysis, CVA, TIA, tremors Endocrine: diabetes, hypothyroidism, hyperthyroidism,  goiter, dry mouth, flushing, heat intolerance,  cold intolerance,  excessive thirst, denies polyuria,  nocturia Hematological:  easy bleeding, excessive bleeding, easy bruising, enlarged lymph nodes, on long term blood thinner, history of past transusions Allergy/immunological:  hives, eczema, frequent infections, multiple drug allergies, seasonal allergies, transplant recipient, multiple food allergies Psychiatric:  anxiety, depression, mood disorder, suicidal ideations,  hallucinations, insomnia  Objective: Vitals:   10/09/19 1602  Temp: 99.2 F (37.3 C)   Vascular Examination: Capillary refill time immediate x 10 digits.  Dorsalis pedis pulses present b/l.   Posterior tibial pulses present b/l.   Digital hair sparse b/l.  Skin temperature gradient WNL b/l.  Dermatological Examination: Skin with normal turgor, texture and tone b/l.  Toenails 1-5 b/l discolored, thick, dystrophic with subungual debris and pain with palpation to nailbeds due to thickness of nails.   Open portal of foreign body located plantarlateral midfoot of right lower extremity. There is tenderness to palpation of area. +Evidence of foreign body granuloma.  Scant amount of creamy white purulence expressed.  No  lymphangitis noted.  No probing to bone, no undermining, no odor was encountered.  Thin splinter of wood approximately 1.5 inches long extracted from opening.    Musculoskeletal: Muscle strength 5/5 to all LE muscle groups b/l.  No pain on palpation to plantar or lateral aspect of calcaneus.  No gross bony deformity b/l.  Pain on palpation noted plantarlateral midfoot.  Neurological: Sensation intact b/l  with 10 gram monofilament.  Vibratory sensation intact.  Xrays right foot: No gas in tissues Fragment noted lateral aspect of calcaneus, most likely old injury.   Assessment: 1. Painful onychomycosis toenails 1-5 b/l  2. Foreign body object right foot 3. NIDDM  Plan: 1. Right foot xray taken and reviewed with patient. 2. Mr. Kane instructed to contact PCP to update tetanus immunization. 3. Discussed need to attempt to remove foreign body in office, and, if unsuccessful, he will need to have it performed in [facility]. He related understanding.  4. Right foot sterile prep performed with chlorhexidine. Utilizing sterile scalpel blade and forceps, foreign body removed in toto. Culture taken of portal of entry right foot. Portal of entry irrigated with hydrogen peroxide. Triple antibiotic ointment and band-aid applied. 5. Patient given written instructions on post-procedure care of right foot. 6. Prescription written for Mupirocin Ointment. Patient is to apply to right foot wound once daily and cover with dressing.  7. Prescription sent to pharmacy for Augmentin 875 mg to be taken po bid x 10 days. 8. Toenails 1-5 b/l were debrided in length and girth without iatrogenic bleeding. 9. Patient was given instructions on daily dressing change/aftercare and was instructed to call immediately if any signs or symptoms of infection arise.  10. Patient is to follow up Dr. Amalia Hailey on Monday, October 14, 2019. 11. Patient instructed to report to emergency department with worsening appearance of foot, increased pain, foul odor, increased redness, swelling, drainage, fever, chills, nightsweats, nausea, vomiting, increased blood sugar.  12. Patient/POA to call should there be a question/concern in the interim.

## 2019-10-12 ENCOUNTER — Other Ambulatory Visit: Payer: Self-pay | Admitting: Family Medicine

## 2019-10-12 DIAGNOSIS — I10 Essential (primary) hypertension: Secondary | ICD-10-CM

## 2019-10-12 DIAGNOSIS — I214 Non-ST elevation (NSTEMI) myocardial infarction: Secondary | ICD-10-CM

## 2019-10-12 LAB — HOUSE ACCOUNT TRACKING

## 2019-10-12 LAB — WOUND CULTURE
MICRO NUMBER:: 10087791
SPECIMEN QUALITY:: ADEQUATE

## 2019-10-14 ENCOUNTER — Ambulatory Visit (INDEPENDENT_AMBULATORY_CARE_PROVIDER_SITE_OTHER): Payer: Medicaid Other | Admitting: Student in an Organized Health Care Education/Training Program

## 2019-10-14 ENCOUNTER — Other Ambulatory Visit: Payer: Self-pay

## 2019-10-14 ENCOUNTER — Ambulatory Visit: Payer: Medicaid Other | Admitting: Podiatry

## 2019-10-14 DIAGNOSIS — Z289 Immunization not carried out for unspecified reason: Secondary | ICD-10-CM | POA: Diagnosis present

## 2019-10-14 DIAGNOSIS — S90851A Superficial foreign body, right foot, initial encounter: Secondary | ICD-10-CM

## 2019-10-14 DIAGNOSIS — E119 Type 2 diabetes mellitus without complications: Secondary | ICD-10-CM

## 2019-10-14 DIAGNOSIS — Z794 Long term (current) use of insulin: Secondary | ICD-10-CM

## 2019-10-14 NOTE — Progress Notes (Signed)
   Subjective:    Patient ID: [name] [name], male    DOB: 08-Aug-1961, 59 y.o.   MRN: [id number]  CC: tetanus shot  HPI:  Patient had a large wooden sliver embedded in his foot which was removed by his podiatrist last week.  Patient states that since removal he has had improvement of his pain and has no concern for infection as he has been compliant with his antibiotic. Patient is overdue for his tetanus shot which is the reason for his presentation today. Patient also has follow-up visit with his podiatrist later today.  I have personally reviewed pertinent past medical history, surgical, family, and social history as appropriate. Objective:  BP 115/70   Pulse 73   Temp 98 F (36.7 C)   Wt 284 lb 3.2 oz (128.9 kg)   SpO2 96%   BMI 44.51 kg/m   Vitals and nursing note reviewed  General: NAD, pleasant, able to participate in exam Extremities: Right dorsal foot has healing scab on lateral mid edge. Negative for tenderness to palpation or erythema. No evidence of drainage.  Neuro: alert, no obvious focal deficits, mild hearing deficit Psych: Normal affect and mood  Assessment & Plan:  Tetanus vaccine not administered Patient is due for Tdap and also in need of booster for recent penetrating foot trauma. Unfortunately, we do not have the vaccine available today. Ordering the tetanus vaccine to arrive tomorrow in clinic and have made patient a nurse visit for the vaccine. Also offered to prescribe the vaccine to the patient to receive at his pharmacy which he declined. Foot did not appear to be acutely infected. Wrapped foot with mupirocin. Follow-up with podiatry later today.  I independently examined pertinent imaging in relation to problem.  Doristine Mango, [facility] PGY-2

## 2019-10-14 NOTE — Assessment & Plan Note (Addendum)
Patient is due for Tdap and also in need of booster for recent penetrating foot trauma. Unfortunately, we do not have the vaccine available today. Ordering the tetanus vaccine to arrive tomorrow in clinic and have made patient a nurse visit for the vaccine. Also offered to prescribe the vaccine to the patient to receive at his pharmacy which he declined. Foot did not appear to be acutely infected. Wrapped foot with mupirocin. Follow-up with podiatry later today.

## 2019-10-14 NOTE — Progress Notes (Signed)
Patient presents for a TDAP shot.  It was not in private stock.  [name] [name] will order TDAP 02/01/2021and it should be in stock 10/15/2019.  Patient scheduled for a 1200 appointment on nurse schedule and was advised to call [facility] to see if shot was available before coming in.  [name] [name], [name]

## 2019-10-14 NOTE — Patient Instructions (Signed)
It was a pleasure to see you today!  To summarize our discussion for this visit:  I apologize that we do not have the vaccine in today. We will order one to be delivered tomorrow. Please come to the clinic to receive your vaccine around 2pm tomorrow. If you do not hear from [place] ahead of time, you may want to call before your appointment to ensure that the vaccine has arrived in time for your appointment.   Some additional health maintenance measures we should update are: Health Maintenance Due  Topic Date Due  . DTAP VACCINES (1) 08/18/1961  . COLONOSCOPY  10/31/2017  .    Please return to our clinic to see [place] tomorrow.  Call the clinic at [phone] if your symptoms worsen or you have any concerns.   Thank you for allowing me to take part in your care,  Dr. Doristine Mango

## 2019-10-15 ENCOUNTER — Ambulatory Visit: Payer: Medicaid Other

## 2019-10-16 ENCOUNTER — Ambulatory Visit: Payer: Medicaid Other

## 2019-10-17 ENCOUNTER — Ambulatory Visit: Payer: Medicaid Other | Admitting: Cardiology

## 2019-10-17 ENCOUNTER — Encounter: Payer: Self-pay | Admitting: Cardiology

## 2019-10-17 ENCOUNTER — Other Ambulatory Visit: Payer: Self-pay

## 2019-10-17 VITALS — BP 129/63 | HR 77 | Temp 97.7°F | Ht 67.0 in | Wt 286.0 lb

## 2019-10-17 DIAGNOSIS — I10 Essential (primary) hypertension: Secondary | ICD-10-CM | POA: Diagnosis not present

## 2019-10-17 DIAGNOSIS — E782 Mixed hyperlipidemia: Secondary | ICD-10-CM

## 2019-10-17 DIAGNOSIS — I2510 Atherosclerotic heart disease of native coronary artery without angina pectoris: Secondary | ICD-10-CM | POA: Diagnosis not present

## 2019-10-17 NOTE — Progress Notes (Signed)
   HPI: 59 y.o. male presenting today status post foreign body removal of the right foot. He states he is doing well. He denies any pain or aggravating factors. He has been taking Augmentin and applying Mupirocin cream as directed. Patient is here for further evaluation and treatment.   Past Medical History:  Diagnosis Date  . Alcoholism ([place])   . Anxiety   . Arthritis    "qwhere"  . Asthma   . CAD S/P percutaneous coronary angioplasty 01/18/12; 10/2013   a. pRCA 3.5 x 18 vision BMS - 4.2 mm; b. 2/'15: mCx 3.5 x 12  Rebel BMS (3.6-3.7 mm)  . Chronic back pain    "mid/lower" (08/01/2014)  . COPD (chronic obstructive pulmonary disease) ([place])    "I'm seeing COPD dr now; don't know if I've got it" (08/01/2014)  . Depression   . GERD (gastroesophageal reflux disease)   . Hyperlipidemia   . Hypertension   . Migraines    "once in awhile" (08/01/2014)  . Non-ST elevation myocardial infarction (NSTEMI) ([place]) 10/2013  . OSA on CPAP 01/19/2012  . Type II diabetes mellitus ([place])      Physical Exam: General: The patient is alert and oriented x3 in no acute distress.  Dermatology: Skin is warm, dry and supple bilateral lower extremities. Negative for open lesions or macerations.  Vascular: Palpable pedal pulses bilaterally. No edema or erythema noted. Capillary refill within normal limits.  Neurological: Epicritic and protective threshold diminished bilaterally.   Musculoskeletal Exam: Range of motion within normal limits to all pedal and ankle joints bilateral. Muscle strength 5/5 in all groups bilateral.   Assessment: 1. S/p removal foreign body right foot - healed  2. Diabetes mellitus with peripheral polyneuropathy    Plan of Care:  1. Patient evaluated.  2. Finish oral antibiotics as prescribed.  3. Recommended good shoe gear.  4. Recommended not going barefoot.  5. Return to clinic in 3 months for routine care.       Edrick Kins, DPM [facility]  Dr. Edrick Kins, DPM    [address], [place] [address]                Office [phone]  Fax [phone]

## 2019-10-21 ENCOUNTER — Ambulatory Visit (INDEPENDENT_AMBULATORY_CARE_PROVIDER_SITE_OTHER): Payer: Medicaid Other | Admitting: *Deleted

## 2019-10-21 ENCOUNTER — Other Ambulatory Visit: Payer: Self-pay

## 2019-10-21 DIAGNOSIS — Z23 Encounter for immunization: Secondary | ICD-10-CM | POA: Diagnosis present

## 2019-10-21 NOTE — Progress Notes (Signed)
Pt is here for a TDAP.  Given in left deltoid, tolerated well. Christen Bame, CMA

## 2019-10-29 ENCOUNTER — Telehealth (INDEPENDENT_AMBULATORY_CARE_PROVIDER_SITE_OTHER): Payer: Medicaid Other | Admitting: Pharmacist

## 2019-10-29 ENCOUNTER — Encounter: Payer: Self-pay | Admitting: Pharmacist

## 2019-10-29 ENCOUNTER — Other Ambulatory Visit: Payer: Self-pay

## 2019-10-29 DIAGNOSIS — Z794 Long term (current) use of insulin: Secondary | ICD-10-CM

## 2019-10-29 DIAGNOSIS — F329 Major depressive disorder, single episode, unspecified: Secondary | ICD-10-CM | POA: Diagnosis not present

## 2019-10-29 DIAGNOSIS — F32A Depression, unspecified: Secondary | ICD-10-CM

## 2019-10-29 DIAGNOSIS — E1110 Type 2 diabetes mellitus with ketoacidosis without coma: Secondary | ICD-10-CM

## 2019-10-29 MED ORDER — VICTOZA 18 MG/3ML ~~LOC~~ SOPN: 1.8000 mg | PEN_INJECTOR | Freq: Every day | SUBCUTANEOUS | 3 refills | Status: DC

## 2019-10-29 MED ORDER — CITALOPRAM HYDROBROMIDE 20 MG PO TABS: 20.0000 mg | ORAL_TABLET | Freq: Every day | ORAL | 1 refills | Status: DC

## 2019-10-29 NOTE — Progress Notes (Signed)
    S:     Chief Complaint  Patient presents with  . Medication Management    Diabetes    Patient contacted over phone to discuss diabetes control and continued support of tobacco cessation ~ 2 years after quit day.     No major changes since last visit.  States he is doing well.   Patient reports adherence with medications.  Current diabetes medications include:  Victoza, Lantus, Novolog, Jardiance and metformin.  Current hypertension medications include: lisinopril - low dose Current hyperlipidemia medications include: atorvastatin  Patient denies hypoglycemic events.  Patient reported dietary habits: Eats 2-3 meals/day Breakfast: peanut butter toast  Lunch: sandwich Dinner: fixed by his sister 3-4 days per week  Snacks:  Drinks: water / Gatorade Zero / ginger ale / diet pepsi / 2% milk  Patient-reported exercise habits: goes to the gym multiple days per week, rides bike 2.2 miles   Patient reports nocturia (nighttime urination).  (drink volume of beverage prior to bedtime which may contribute.  Patient reports neuropathy (nerve pain). Patient denies visual changes. Patient reports self foot exams.  FOOT is healed per patient report.     O:  Physical Exam N/A ROS Denies any issues  Lab Results  Component Value Date   HGBA1C 8.0 (A) 08/26/2019    Lipid Panel     Component Value Date/Time   CHOL 94 (L) 05/09/2019 1537   TRIG 143 05/09/2019 1537   HDL 33 (L) 05/09/2019 1537   CHOLHDL 2.8 05/09/2019 1537   CHOLHDL 3.7 03/29/2016 1227   VLDL 53 (H) 03/29/2016 1227   LDLCALC 32 05/09/2019 1537    Home fasting blood sugars: 100-low 200s   Clinical Atherosclerotic Cardiovascular Disease (ASCVD): Yes   A/P: Diabetes longstanding, currently well controlled on multiple medications.  No changes today.   ASCVD risk - secondary prevention in patient with diabetes. Last LDL is controlled. High intensity statin indicated. Continue Atorvastatin.    History tobacco  abuse.  Continues reported abstinence from smoking almost 2 years.   Written patient instructions provided.  Total time telephone medication review and education 17 minutes.   Follow up PCP New Provider visit Clinic - March 10th.    Telephone encounter conducted with Acey Lav, PharmD Resident.

## 2019-11-05 ENCOUNTER — Other Ambulatory Visit: Payer: Self-pay | Admitting: Family Medicine

## 2019-11-05 DIAGNOSIS — I2510 Atherosclerotic heart disease of native coronary artery without angina pectoris: Secondary | ICD-10-CM

## 2019-11-05 DIAGNOSIS — I10 Essential (primary) hypertension: Secondary | ICD-10-CM

## 2019-11-05 DIAGNOSIS — Z9861 Coronary angioplasty status: Secondary | ICD-10-CM

## 2019-11-05 DIAGNOSIS — E1159 Type 2 diabetes mellitus with other circulatory complications: Secondary | ICD-10-CM

## 2019-11-06 ENCOUNTER — Other Ambulatory Visit: Payer: Self-pay | Admitting: Family Medicine

## 2019-11-06 DIAGNOSIS — J449 Chronic obstructive pulmonary disease, unspecified: Secondary | ICD-10-CM

## 2019-11-13 ENCOUNTER — Other Ambulatory Visit: Payer: Self-pay | Admitting: Family Medicine

## 2019-11-13 DIAGNOSIS — I10 Essential (primary) hypertension: Secondary | ICD-10-CM

## 2019-11-13 DIAGNOSIS — I214 Non-ST elevation (NSTEMI) myocardial infarction: Secondary | ICD-10-CM

## 2019-11-20 ENCOUNTER — Ambulatory Visit: Payer: Medicaid Other | Admitting: Family Medicine

## 2019-11-20 ENCOUNTER — Other Ambulatory Visit: Payer: Self-pay

## 2019-11-20 ENCOUNTER — Encounter: Payer: Self-pay | Admitting: Gastroenterology

## 2019-11-20 ENCOUNTER — Encounter: Payer: Self-pay | Admitting: Family Medicine

## 2019-11-20 VITALS — BP 138/72 | HR 75 | Wt 288.6 lb

## 2019-11-20 DIAGNOSIS — I10 Essential (primary) hypertension: Secondary | ICD-10-CM | POA: Diagnosis not present

## 2019-11-20 DIAGNOSIS — J449 Chronic obstructive pulmonary disease, unspecified: Secondary | ICD-10-CM

## 2019-11-20 DIAGNOSIS — Z1211 Encounter for screening for malignant neoplasm of colon: Secondary | ICD-10-CM

## 2019-11-20 DIAGNOSIS — G4733 Obstructive sleep apnea (adult) (pediatric): Secondary | ICD-10-CM | POA: Diagnosis not present

## 2019-11-20 DIAGNOSIS — E1159 Type 2 diabetes mellitus with other circulatory complications: Secondary | ICD-10-CM

## 2019-11-20 DIAGNOSIS — Z9989 Dependence on other enabling machines and devices: Secondary | ICD-10-CM

## 2019-11-20 DIAGNOSIS — F329 Major depressive disorder, single episode, unspecified: Secondary | ICD-10-CM | POA: Diagnosis not present

## 2019-11-20 DIAGNOSIS — Z794 Long term (current) use of insulin: Secondary | ICD-10-CM | POA: Diagnosis not present

## 2019-11-20 DIAGNOSIS — F32A Depression, unspecified: Secondary | ICD-10-CM

## 2019-11-20 LAB — POCT GLYCOSYLATED HEMOGLOBIN (HGB A1C): HbA1c, POC (controlled diabetic range): 7.8 % — AB (ref 0.0–7.0)

## 2019-11-20 NOTE — Patient Instructions (Signed)
Good to see you today!  Thanks for coming in.  You are doing well with your diabetes.  Come back in 3 months for a A1c  Continue to work weight loss and exercise  When it is available get the Covid vaccine  I will put in referrals to:  Sleep Medicine   [place] for a colonoscopy  I will call you if your tests are not good.  Otherwise I will send you a letter.  If you do not hear from me with in 2 weeks please call our office.

## 2019-11-20 NOTE — Progress Notes (Signed)
    SUBJECTIVE:   CHIEF COMPLAINT / HPI:  HYPERTENSION Disease Monitoring: Blood pressure range-not checking Chest pain, palpitations- rare chest pain he takes ntg resolves       Medications: Compliance- knows all his meds Lightheadedness,Syncope- no   Edema- trace  DIABETES Disease Monitoring: Blood Sugar ranges-usually around 150s.  3-4 episodes in last few months of low blood sugar in 70s with feeling symptoms  Medications: Compliance- knows his medications.  Sometimes takes his novolog and lantus once or twice a day depending on his readings Hypoglycemic symptoms- shaky sweaty  HYPERLIPIDEMIA Medications: Compliance- daily statin Right upper quadrant pain- no  Muscle aches- no   PERTINENT  PMH / PSH: OSA, Follows with cards  OBJECTIVE:   BP 138/72   Pulse 75   Wt 288 lb 9.6 oz (130.9 kg)   SpO2 95%   BMI 45.20 kg/m   Heart - Regular rate and rhythm.  No murmurs, gallops or rubs.    Lungs:  Normal respiratory effort, chest expands symmetrically. Lungs are clear to auscultation, no crackles or wheezes. Extremities:  No cyanosis, edema, or deformity noted with good range of motion of all major joints.     ASSESSMENT/PLAN:   COPD, moderate (HCC) Uses his inhalers as needed now.  In summer needs more regularly   Depression, acute Feels is well controlled  DM type 2 (diabetes mellitus, type 2) Improving control.  His as needed use of insulins (daily or twice a day) is unconventional but does not seem to have frequent lows   Essential hypertension BP Readings from Last 3 Encounters:  11/20/19 138/72  10/17/19 129/63  10/14/19 115/70   At goal continue current medications Check bmet   OSA on CPAP Asks for referral for follow up at [place], [facility] [place]

## 2019-11-20 NOTE — Assessment & Plan Note (Signed)
Feels is well controlled

## 2019-11-20 NOTE — Assessment & Plan Note (Signed)
Uses his inhalers as needed now.  In summer needs more regularly

## 2019-11-20 NOTE — Assessment & Plan Note (Signed)
Improving control.  His as needed use of insulins (daily or twice a day) is unconventional but does not seem to have frequent lows

## 2019-11-20 NOTE — Assessment & Plan Note (Signed)
Asks for referral for follow up at [facility] sleep medicine

## 2019-11-20 NOTE — Assessment & Plan Note (Signed)
BP Readings from Last 3 Encounters:  11/20/19 138/72  10/17/19 129/63  10/14/19 115/70   At goal continue current medications Check bmet

## 2019-11-21 ENCOUNTER — Encounter: Payer: Self-pay | Admitting: Family Medicine

## 2019-11-21 LAB — BASIC METABOLIC PANEL
BUN/Creatinine Ratio: 18 (ref 9–20)
BUN: 13 mg/dL (ref 6–24)
CO2: 25 mmol/L (ref 20–29)
Calcium: 9.1 mg/dL (ref 8.7–10.2)
Chloride: 100 mmol/L (ref 96–106)
Creatinine, Ser: 0.71 mg/dL — ABNORMAL LOW (ref 0.76–1.27)
GFR calc Af Amer: 120 mL/min/{1.73_m2} (ref >59)
GFR calc non Af Amer: 103 mL/min/{1.73_m2} (ref >59)
Glucose: 168 mg/dL — ABNORMAL HIGH (ref 65–99)
Potassium: 3.9 mmol/L (ref 3.5–5.2)
Sodium: 138 mmol/L (ref 134–144)

## 2019-11-30 ENCOUNTER — Other Ambulatory Visit: Payer: Self-pay | Admitting: Family Medicine

## 2019-11-30 DIAGNOSIS — E1159 Type 2 diabetes mellitus with other circulatory complications: Secondary | ICD-10-CM

## 2019-12-04 ENCOUNTER — Other Ambulatory Visit: Payer: Self-pay | Admitting: Family Medicine

## 2019-12-04 DIAGNOSIS — I2510 Atherosclerotic heart disease of native coronary artery without angina pectoris: Secondary | ICD-10-CM

## 2019-12-04 DIAGNOSIS — Z9861 Coronary angioplasty status: Secondary | ICD-10-CM

## 2019-12-04 DIAGNOSIS — I10 Essential (primary) hypertension: Secondary | ICD-10-CM

## 2019-12-10 ENCOUNTER — Telehealth: Payer: Self-pay | Admitting: *Deleted

## 2019-12-10 NOTE — Telephone Encounter (Signed)
Spoke with pt- he does take Effient everyday.  Informed him that he will seeing [name] instead of the [place] on 12-23-19 at 1:30 pm  Understanding voiced

## 2019-12-10 NOTE — Telephone Encounter (Signed)
Patient is returning your call.  

## 2019-12-10 NOTE — Telephone Encounter (Signed)
LMOM to call back

## 2019-12-10 NOTE — Telephone Encounter (Signed)
Pt called to clarify if he is still on Effient- according to AVS on is still on this medication. LMOM to call back.  Appt has been made with [name] for 12-23-19 at 1:30 (instead of seeing PV nurse, pt will see her).  Will cancel OV and keep PV if pt is off of Effient

## 2019-12-12 ENCOUNTER — Other Ambulatory Visit: Payer: Self-pay | Admitting: Family Medicine

## 2019-12-12 DIAGNOSIS — G8929 Other chronic pain: Secondary | ICD-10-CM

## 2019-12-12 DIAGNOSIS — M545 Low back pain, unspecified: Secondary | ICD-10-CM

## 2019-12-20 ENCOUNTER — Other Ambulatory Visit: Payer: Self-pay | Admitting: Family Medicine

## 2019-12-20 DIAGNOSIS — I10 Essential (primary) hypertension: Secondary | ICD-10-CM

## 2019-12-20 DIAGNOSIS — I214 Non-ST elevation (NSTEMI) myocardial infarction: Secondary | ICD-10-CM

## 2019-12-21 ENCOUNTER — Ambulatory Visit: Payer: Medicaid Other | Attending: Internal Medicine

## 2019-12-21 DIAGNOSIS — Z23 Encounter for immunization: Secondary | ICD-10-CM

## 2019-12-21 NOTE — Progress Notes (Signed)
   Covid-19 Vaccination Clinic  Name:  [name] [name]    MRN: [id number] DOB: [birth year]  12/21/2019  [name] [name] was observed post Covid-19 immunization for 15 minutes without incident. He was provided with Vaccine Information Sheet and instruction to access the V-Safe system.   [name] [name] was instructed to call 911 with any severe reactions post vaccine: Marland Kitchen Difficulty breathing  . Swelling of face and throat  . A fast heartbeat  . A bad rash all over body  . Dizziness and weakness   Immunizations Administered    Name Date Dose VIS Date Route   Pfizer COVID-19 Vaccine 12/21/2019 12:34 PM 0.3 mL 08/23/2019 Intramuscular   Manufacturer: [place]   Lot: [id number]   [place]: [id number]

## 2019-12-23 ENCOUNTER — Telehealth: Payer: Self-pay | Admitting: *Deleted

## 2019-12-23 ENCOUNTER — Ambulatory Visit: Payer: Medicaid Other | Admitting: Gastroenterology

## 2019-12-23 ENCOUNTER — Encounter: Payer: Self-pay | Admitting: Gastroenterology

## 2019-12-23 VITALS — BP 138/78 | HR 80 | Temp 98.7°F | Ht 65.5 in | Wt 288.0 lb

## 2019-12-23 DIAGNOSIS — Z794 Long term (current) use of insulin: Secondary | ICD-10-CM

## 2019-12-23 DIAGNOSIS — E119 Type 2 diabetes mellitus without complications: Secondary | ICD-10-CM

## 2019-12-23 DIAGNOSIS — Z1211 Encounter for screening for malignant neoplasm of colon: Secondary | ICD-10-CM | POA: Diagnosis not present

## 2019-12-23 DIAGNOSIS — Z7902 Long term (current) use of antithrombotics/antiplatelets: Secondary | ICD-10-CM | POA: Diagnosis not present

## 2019-12-23 MED ORDER — NA SULFATE-K SULFATE-MG SULF 17.5-3.13-1.6 GM/177ML PO SOLN: 1.0000 | Freq: Once | ORAL | 0 refills | Status: AC

## 2019-12-23 NOTE — Telephone Encounter (Signed)
[facility] Pre-operative Risk Assessment     Request for surgical clearance:     Endoscopy Procedure  What type of surgery is being performed?     colonoscopy  When is this surgery scheduled?     Wednesday 01/29/20  What type of clearance is required ?   Pharmacy  Are there any medications that need to be held prior to surgery and how long? Effient 7 days.  Practice name and name of physician performing surgery?  [place], MD - [facility]  What is your office phone and fax number?      Phone- [phone]  [phone]  Anesthesia type (None, local, MAC, general) ?       MAC

## 2019-12-23 NOTE — Telephone Encounter (Signed)
OK to hold Effient for one week prior to colonoscopy.  Liahm Grivas Martinique MD, [facility]

## 2019-12-23 NOTE — Progress Notes (Signed)
12/23/2019 [name] [name] [id number] 1960-10-06   HISTORY OF PRESENT ILLNESS:  This is a 59 year old male who was previously seen by Dr. Havery Moros for [place] screening in 09/2015 at which time he had a Cologuard performed that was negative.  He is here today to discuss CRC screening again.  He is on board with having colonoscopy this time.  He is actually already scheduled, but then staff realized that he was on Effient for history of coronary artery disease so PV was cancelled and this appt was scheduled with me.  No cardiac intervention for several years.  [name] his cardiologist.  He is diabetic on insulin.  He says that he moves his bowels well.  No diarrhea or constipation.  No rectal bleeding.  No abdominal pain.   Past Medical History:  Diagnosis Date  . Alcoholism ([place])   . Anxiety   . Arthritis    "qwhere"  . Asthma   . CAD S/P percutaneous coronary angioplasty 01/18/12; 10/2013   a. pRCA 3.5 x 18 vision BMS - 4.2 mm; b. 2/'15: mCx 3.5 x 12  Rebel BMS (3.6-3.7 mm)  . Chronic back pain    "mid/lower" (08/01/2014)  . COPD (chronic obstructive pulmonary disease) ([place])    "I'm seeing COPD dr now; don't know if I've got it" (08/01/2014)  . Depression   . GERD (gastroesophageal reflux disease)   . Hyperlipidemia   . Hypertension   . Migraines    "once in awhile" (08/01/2014)  . Non-ST elevation myocardial infarction (NSTEMI) ([place]) 10/2013  . OSA on CPAP 01/19/2012  . Type II diabetes mellitus ([place])    Past Surgical History:  Procedure Laterality Date  . CARDIAC CATHETERIZATION  07/2014   Left Main: Short, large-caliber vessel. Widely patent. Bifurcates into the LAD and Circumflex. Angiographically normal.  . CORONARY ANGIOPLASTY WITH STENT PLACEMENT  01/2012; 11/07/2013; 08/01/2014   "1 + 2 + 1"   . CORONARY ANGIOPLASTY WITH STENT PLACEMENT  07/2014   Severe single-vessel disease involving the bifurcation of OM1 and OM 2 with 99% in-stent restenosis in the bare-metal stent placed  in the OM 2.  . LEFT HEART CATHETERIZATION WITH CORONARY ANGIOGRAM N/A 01/20/2012   Procedure: LEFT HEART CATHETERIZATION WITH CORONARY ANGIOGRAM;  Surgeon: Jettie Booze, MD;  Location: [facility] CATH LAB;  Service: Cardiovascular;  Laterality: N/A;  . LEFT HEART CATHETERIZATION WITH CORONARY ANGIOGRAM N/A 11/07/2013   Procedure: LEFT HEART CATHETERIZATION WITH CORONARY ANGIOGRAM;  Surgeon: Jettie Booze, MD;  Location: [facility] CATH LAB;  Service: Cardiovascular;  Laterality: N/A;  . LEFT HEART CATHETERIZATION WITH CORONARY ANGIOGRAM N/A 08/01/2014   Procedure: LEFT HEART CATHETERIZATION WITH CORONARY ANGIOGRAM;  Surgeon: Leonie Man, MD;  Location: [facility] CATH LAB;  Service: Cardiovascular;  Laterality: N/A;  . PERCUTANEOUS CORONARY STENT INTERVENTION (PCI-S)  01/20/2012   Procedure: PERCUTANEOUS CORONARY STENT INTERVENTION (PCI-S);  Surgeon: Jettie Booze, MD;  Location: [facility] CATH LAB;  Service: Cardiovascular;;  . PERCUTANEOUS CORONARY STENT INTERVENTION (PCI-S)  11/07/2013   Procedure: PERCUTANEOUS CORONARY STENT INTERVENTION (PCI-S);  Surgeon: Jettie Booze, MD;  Location: [facility] CATH LAB;  Service: Cardiovascular;;    reports that he quit smoking about 22 months ago. His smoking use included cigarettes. He started smoking about 44 years ago. He has a 42.00 pack-year smoking history. He has never used smokeless tobacco. He reports current alcohol use of about 6.0 standard drinks of alcohol per week. He reports that he does not use drugs. family history  includes Breast cancer in his mother; Diabetes in his brother and sister; Heart attack in his brother; Heart attack (age of onset: 24) in his father; Hyperlipidemia in his sister and sister; Hypertension in his sister and sister; Stroke in his brother. Allergies  Allergen Reactions  . Shellfish Allergy Nausea And Vomiting    OYSTERS      Outpatient Encounter Medications as of 12/23/2019  Medication Sig  . ACCU-CHEK AVIVA PLUS test strip  USE ONE STRIP TO TEST FOUR TIMES A DAY  . ACCU-CHEK FASTCLIX LANCETS MISC 1 Units by Percutaneous route 4 (four) times daily.  Marland Kitchen albuterol (VENTOLIN HFA) 108 (90 Base) MCG/ACT inhaler INHALE TWO PUFFS BY MOUTH EVERY 6 HOURS AS NEEDED FOR WHEEZING OR FOR SHORTNESS OF BREATH  . aspirin 81 MG chewable tablet Chew 1 tablet (81 mg total) by mouth daily.  Marland Kitchen atorvastatin (LIPITOR) 80 MG tablet TAKE ONE TABLET BY MOUTH DAILY  . BD INSULIN SYRINGE U/F 31G X 5/16" 1 ML MISC USE UP TO FIVE TIMES A DAY WITH NOVOLOG AND LANTUS DOSING  . BD ULTRA-FINE PEN NEEDLES 29G X 12.7MM MISC USE TO WITH VICTOZA PEN, INJECTION ONCE DAILY.  Marland Kitchen Blood Glucose Monitoring Suppl (ACCU-CHEK AVIVA PLUS) w/Device KIT CHECK BLOOD GLUCOSE FASTING AND 2 HOURS AFTER [facility] AND DINNER  . buPROPion (WELLBUTRIN XL) 150 MG 24 hr tablet TAKE ONE TABLET BY MOUTH DAILY  . carvedilol (COREG) 3.125 MG tablet TAKE ONE TABLET BY MOUTH TWICE A DAY WITH MEALS  . ciprofloxacin-dexamethasone (CIPRODEX) OTIC suspension Place 4 drops into the left ear 2 (two) times daily.  . citalopram (CELEXA) 20 MG tablet Take 1 tablet (20 mg total) by mouth daily.  . empagliflozin (JARDIANCE) 25 MG TABS tablet Take 25 mg by mouth daily.  . insulin aspart (NOVOLOG) 100 UNIT/ML injection Inject 45 Units into the skin 2 (two) times daily with a meal. (Patient taking differently: Inject 30 Units into the skin 2 (two) times daily with a meal. )  . insulin glargine (LANTUS) 100 UNIT/ML injection Inject 0.45 mLs (45 Units total) into the skin 2 (two) times daily.  Marland Kitchen liraglutide (VICTOZA) 18 MG/3ML SOPN Inject 0.3 mLs (1.8 mg total) into the skin daily. INJECT 0.3 MLS (1.8 MG TOTAL) INTO THE SKIN DAILY  . lisinopril (ZESTRIL) 2.5 MG tablet Take 1 tablet (2.5 mg total) by mouth daily.  . Melatonin 2.5 MG CAPS Take 5 mg by mouth at bedtime.  . metFORMIN (GLUCOPHAGE) 1000 MG tablet TAKE ONE TABLET BY MOUTH TWICE A DAY WITH MEALS  . mometasone-formoterol (DULERA) 200-5  MCG/ACT AERO INHALE TWO PUFFS BY MOUTH TWICE A DAY  . Multiple Vitamin (MULTIVITAMIN) tablet Take 1 tablet by mouth daily.  . nitroGLYCERIN (NITROSTAT) 0.4 MG SL tablet DISSOLVE 1 TAB UNDER TONGUE FOR CHEST PAIN - IF PAIN REMAINS AFTER 5 MIN, CALL 911 AND REPEAT DOSE. MAX 3 TABS IN 15 MINUTES  . Omega-3 Fatty Acids (FISH OIL) 1000 MG CAPS Take 1 capsule (1,000 mg total) by mouth daily.  . prasugrel (EFFIENT) 10 MG TABS tablet TAKE ONE TABLET BY MOUTH DAILY  . traMADol (ULTRAM) 50 MG tablet TAKE ONE TABLET BY MOUTH EVERY 6 HOURS AS NEEDED  . [DISCONTINUED] Blood Glucose Monitoring Suppl (ACCU-CHEK AVIVA PLUS) w/Device KIT CHECK BLOOD GLUCOSE FASTING AND 2 HOURS AFTER BREAKFAST, LUNCH AND DINNER   No facility-administered encounter medications on file as of 12/23/2019.     REVIEW OF SYSTEMS  : All other systems reviewed and negative except where noted  in the History of Present Illness.   PHYSICAL EXAM: BP 138/78 (BP Location: Left Arm, Patient Position: Sitting, Cuff Size: Normal)   Pulse 80   Temp 98.7 F (37.1 C)   Ht 5' 5.5" (1.664 m) Comment: height measured without shoes  Wt 288 lb (130.6 kg)   BMI 47.20 kg/m  General: Well developed white male in no acute distress Head: Normocephalic and atraumatic Eyes:  Sclerae anicteric, conjunctiva pink. Ears: Normal auditory acuity Lungs: Clear throughout to auscultation; no increased WOB. Heart: Regular rate and rhythm; no M/R/G. Abdomen: Soft, non-distended.  BS present.  Non-tender. Rectal:  Will be done at the time of colonoscopy. Musculoskeletal: Symmetrical with no gross deformities  Skin: No lesions on visible extremities Extremities: No edema  Neurological: Alert oriented x 4, grossly non-focal Psychological:  Alert and cooperative. Normal mood and affect  ASSESSMENT AND PLAN: *Screening CRC:  Had negative Cologuard in 09/2015.  Due for screening again.  Never had colonoscopy.  Patient agreeable to do colonoscopy this  time. *IDDM:  Insulin will be adjusted prior to endoscopic procedure per protocol. Will resume normal dosing after procedure. *Chronic antiplatelet use with Effient due to history of CAD:  Hold Effient for 7 days before procedure - will instruct when and how to resume after procedure. Risks and benefits of procedure including bleeding, perforation, infection, missed lesions, medication reactions and possible hospitalization or surgery if complications occur explained. Additional rare but real risk of cardiovascular event such as heart attack or ischemia/infarct of other organs off of Effient explained and need to seek urgent help if this occurs. Will communicate by phone or EMR with patient's prescribing provider, [name], to confirm that holding Effient is reasonable in this case.    CC:  Lind Covert, *

## 2019-12-23 NOTE — Patient Instructions (Addendum)
If you are age 59 or older, your body mass index should be between 23-30. Your Body mass index is 47.2 kg/m. If this is out of the aforementioned range listed, please consider follow up with your Primary Care Provider.  If you are age 32 or younger, your body mass index should be between 19-25. Your Body mass index is 47.2 kg/m. If this is out of the aformentioned range listed, please consider follow up with your Primary Care Provider.   You have been scheduled for a colonoscopy. Please follow written instructions given to you at your visit today.  Please pick up your prep supplies at the pharmacy within the next 1-3 days. If you use inhalers (even only as needed), please bring them with you on the day of your procedure.  You will be contacted by our office prior to your procedure for directions on holding your Effient.  If you do not hear from our office 1 week prior to your scheduled procedure, please call [phone] to discuss.

## 2019-12-24 ENCOUNTER — Telehealth (INDEPENDENT_AMBULATORY_CARE_PROVIDER_SITE_OTHER): Payer: Medicaid Other | Admitting: Pharmacist

## 2019-12-24 ENCOUNTER — Other Ambulatory Visit: Payer: Self-pay

## 2019-12-24 ENCOUNTER — Encounter: Payer: Self-pay | Admitting: Pharmacist

## 2019-12-24 DIAGNOSIS — Z794 Long term (current) use of insulin: Secondary | ICD-10-CM

## 2019-12-24 DIAGNOSIS — E119 Type 2 diabetes mellitus without complications: Secondary | ICD-10-CM

## 2019-12-24 NOTE — Telephone Encounter (Signed)
   Primary Cardiologist: Peter Martinique, MD  Chart reviewed as part of pre-operative protocol coverage.   Per [name], patient can hold effient 1 week prior to his upcoming colonoscopy and should restart effient when cleared to do so by his gastroenterologist.   I will route this recommendation to the requesting party via [facility] fax function and remove from pre-op pool.  Please call with questions.  Abigail Butts, PA-C 12/24/2019, 8:13 AM

## 2019-12-24 NOTE — Progress Notes (Signed)
Agree with assessment and plan as outlined.  

## 2019-12-24 NOTE — Patient Instructions (Signed)
Patient treatment plan around colonoscopy:  Key items reviewed: Stop Diabetes meds (metformin, empagliflozin and liraglutide) for the day prior to the procedure.  Take 22 units of Lantus (1/2 of ususal dose of 45 units BID) the night prior to procedure.   IF blood glucose is > 250mg /dl - take Novolog 10 units up to 3 times during day prior and only once day of procedure.   Following colonoscopy procedure: RESTART usual regimen.  -Continue basal insulin lantus (insulin glargine).  Take one dose after the visit when you eat your first meal.  -Continue  Novolog (insulin aspart) 30 units TID AC.  -Continue GLP-1 Victoza (generic name liraglutide) at 1.8mg /dl.  -Continue SGLT2-I  Jardiance (generic name empagliflozin) at 25mg  daily.  -Continue Metformin 1000mg  BID.

## 2019-12-24 NOTE — Assessment & Plan Note (Signed)
Diabetes longstanding who is currently under good control with use of multi-drug regimen. Patient is able to verbalize appropriate hypoglycemia management plan. Patient appears adherent with medication. Patient treatment plan around colonoscopy was discussed at length, in great detail.  Key items reviewed: Stop Diabetes meds (metformin, empagliflozin and liraglutide) for the day prior to the procedure.  Take 22 units of Lantus (1/2 of ususal dose of 45 units BID) the night prior to procedure.   IF blood glucose is > 250mg /dl - take Novolog 10 units up to 3 times daily/  Following colonoscopy procedure: RESTART usual regimen.  -Continued basal insulin lantus (insulin glargine).  Take one dose after the visit when you eat your first meal.  -Continued   Novolog (insulin aspart) 30 units TID AC.  -Continued GLP-1 Victoza (generic name liraglutide) at 1.8mg /dl.  -Continued SGLT2-I  Jardiance (generic name empagliflozin) at 25mg  daily.  -Continue Metformin 1000mg  BID. -Extensively discussed pathophysiology of diabetes, recommended lifestyle interventions, dietary effects on blood sugar control -Counseled on s/sx of and management of hypoglycemia.

## 2019-12-24 NOTE — Progress Notes (Signed)
    S:     Chief Complaint  Patient presents with  . Medication Management    Diabetes - Medication Review    Patient visit conducted via telephone (patient preference).  Visit was focused on follow-up for for diabetes evaluation, education, and management including blood glucose management around GI - colonoscopy.  Patient was referred and last seen by Primary Care Provider on 11/20/2019.   Patient reports Diabetes was diagnosed in 2013.   Insurance coverage/medication affordability: Medicaid  Patient reports adherence with medications.  Current diabetes medications include: Metformin, Jaridance (emplagliflozin), Victoza (liraglutide), Lantus (insulin glargine) and Novolog (insulin aspart) Current hypertension medications include: lisinopril and carvedilol Current hyperlipidemia medications include: atorvastatin  Patient denies hypoglycemic events.  Patient reported dietary habits: Eats 3 meals/day  Patient-reported exercise habits: going to the gym 3 days per week       O:  (visit by telephone) Physical Exam Neurological:     Mental Status: He is alert.  Psychiatric:        Mood and Affect: Mood normal.        Thought Content: Thought content normal.    Review of Systems  All other systems reviewed and are negative.    Lab Results  Component Value Date   HGBA1C 7.8 (A) 11/20/2019   Lipid Panel     Component Value Date/Time   CHOL 94 (L) 05/09/2019 1537   TRIG 143 05/09/2019 1537   HDL 33 (L) 05/09/2019 1537   CHOLHDL 2.8 05/09/2019 1537   CHOLHDL 3.7 03/29/2016 1227   VLDL 53 (H) 03/29/2016 1227   LDLCALC 32 05/09/2019 1537    Home fasting blood sugars: 122, 171, 152, 183, 199, 107 2 hour post-meal/random blood sugars:    Clinical Atherosclerotic Cardiovascular Disease (ASCVD): YES   A/P: Diabetes longstanding who is currently under good control with use of multi-drug regimen. Patient is able to verbalize appropriate hypoglycemia management plan.  Patient appears adherent with medication. Patient treatment plan around colonoscopy was discussed at length, in great detail.  Key items reviewed: Stop Diabetes meds (metformin, empagliflozin and liraglutide) for the day prior to the procedure.  Take 22 units of Lantus (1/2 of ususal dose of 45 units BID) the night prior to procedure.   IF blood glucose is > 250mg /dl - take Novolog 10 units up to 3 times daily and only once the AM of the procedure (Procedure is at 2:00PM).  Following colonoscopy procedure: RESTART usual regimen.  -Continued basal insulin lantus (insulin glargine).  Take one dose after the visit when you eat your first meal.  -Continued   Novolog (insulin aspart) 30 units TID AC.  -Continued GLP-1 Victoza (generic name liraglutide) at 1.8mg /dl.  -Continued SGLT2-I  Jardiance (generic name empagliflozin) at 25mg  daily.  -Continue Metformin 1000mg  BID. -Extensively discussed pathophysiology of diabetes, recommended lifestyle interventions, dietary effects on blood sugar control -Counseled on s/sx of and management of hypoglycemia.   Written patient instructions provided.  Total time in telephone consult 35 minutes.   Follow up PCP, Dr. Erin Hearing in June.  Then follow up in Rx clinic PRN to be decided by PCP.   Patient seen with Maryan Rued, PharmD PGY-1 Resident.    **

## 2019-12-31 ENCOUNTER — Other Ambulatory Visit: Payer: Self-pay | Admitting: Family Medicine

## 2019-12-31 DIAGNOSIS — I2510 Atherosclerotic heart disease of native coronary artery without angina pectoris: Secondary | ICD-10-CM

## 2019-12-31 DIAGNOSIS — Z9861 Coronary angioplasty status: Secondary | ICD-10-CM

## 2019-12-31 DIAGNOSIS — I10 Essential (primary) hypertension: Secondary | ICD-10-CM

## 2020-01-03 NOTE — Telephone Encounter (Signed)
Patient is returning your call.  

## 2020-01-03 NOTE — Telephone Encounter (Signed)
Pt called again. He stated that he will be available all afternoon.

## 2020-01-03 NOTE — Telephone Encounter (Signed)
Left message for patient to call the office

## 2020-01-06 ENCOUNTER — Encounter: Payer: Medicaid Other | Admitting: Gastroenterology

## 2020-01-06 NOTE — Telephone Encounter (Signed)
Patient informed to hold his Effient for 1 week prior to colonoscopy.

## 2020-01-06 NOTE — Telephone Encounter (Signed)
Left message for patient to call the office

## 2020-01-13 ENCOUNTER — Encounter: Payer: Self-pay | Admitting: Podiatry

## 2020-01-13 ENCOUNTER — Ambulatory Visit: Payer: Medicaid Other | Admitting: Podiatry

## 2020-01-13 ENCOUNTER — Other Ambulatory Visit: Payer: Self-pay

## 2020-01-13 VITALS — Temp 96.4°F

## 2020-01-13 DIAGNOSIS — B351 Tinea unguium: Secondary | ICD-10-CM | POA: Diagnosis not present

## 2020-01-13 DIAGNOSIS — M79674 Pain in right toe(s): Secondary | ICD-10-CM | POA: Diagnosis not present

## 2020-01-13 DIAGNOSIS — M79675 Pain in left toe(s): Secondary | ICD-10-CM

## 2020-01-13 DIAGNOSIS — Z794 Long term (current) use of insulin: Secondary | ICD-10-CM

## 2020-01-13 DIAGNOSIS — E119 Type 2 diabetes mellitus without complications: Secondary | ICD-10-CM

## 2020-01-13 NOTE — Patient Instructions (Signed)
Diabetes Mellitus and Foot Care Foot care is an important part of your health, especially when you have diabetes. Diabetes may cause you to have problems because of poor blood flow (circulation) to your feet and legs, which can cause your skin to:  Become thinner and drier.  Break more easily.  Heal more slowly.  Peel and crack. You may also have nerve damage (neuropathy) in your legs and feet, causing decreased feeling in them. This means that you may not notice minor injuries to your feet that could lead to more serious problems. Noticing and addressing any potential problems early is the best way to prevent future foot problems. How to care for your feet Foot hygiene  Wash your feet daily with warm water and mild soap. Do not use hot water. Then, pat your feet and the areas between your toes until they are completely dry. Do not soak your feet as this can dry your skin.  Trim your toenails straight across. Do not dig under them or around the cuticle. File the edges of your nails with an emery board or nail file.  Apply a moisturizing lotion or petroleum jelly to the skin on your feet and to dry, brittle toenails. Use lotion that does not contain alcohol and is unscented. Do not apply lotion between your toes. Shoes and socks  Wear clean socks or stockings every day. Make sure they are not too tight. Do not wear knee-high stockings since they may decrease blood flow to your legs.  Wear shoes that fit properly and have enough cushioning. Always look in your shoes before you put them on to be sure there are no objects inside.  To break in new shoes, wear them for just a few hours a day. This prevents injuries on your feet. Wounds, scrapes, corns, and calluses  Check your feet daily for blisters, cuts, bruises, sores, and redness. If you cannot see the bottom of your feet, use a mirror or ask someone for help.  Do not cut corns or calluses or try to remove them with medicine.  If you  find a minor scrape, cut, or break in the skin on your feet, keep it and the skin around it clean and dry. You may clean these areas with mild soap and water. Do not clean the area with peroxide, alcohol, or iodine.  If you have a wound, scrape, corn, or callus on your foot, look at it several times a day to make sure it is healing and not infected. Check for: ? Redness, swelling, or pain. ? Fluid or blood. ? Warmth. ? Pus or a bad smell. General instructions  Do not cross your legs. This may decrease blood flow to your feet.  Do not use heating pads or hot water bottles on your feet. They may burn your skin. If you have lost feeling in your feet or legs, you may not know this is happening until it is too late.  Protect your feet from hot and cold by wearing shoes, such as at the beach or on hot pavement.  Schedule a complete foot exam at least once a year (annually) or more often if you have foot problems. If you have foot problems, report any cuts, sores, or bruises to your health care provider immediately. Contact a health care provider if:  You have a medical condition that increases your risk of infection and you have any cuts, sores, or bruises on your feet.  You have an injury that is not   healing.  You have redness on your legs or feet.  You feel burning or tingling in your legs or feet.  You have pain or cramps in your legs and feet.  Your legs or feet are numb.  Your feet always feel cold.  You have pain around a toenail. Get help right away if:  You have a wound, scrape, corn, or callus on your foot and: ? You have pain, swelling, or redness that gets worse. ? You have fluid or blood coming from the wound, scrape, corn, or callus. ? Your wound, scrape, corn, or callus feels warm to the touch. ? You have pus or a bad smell coming from the wound, scrape, corn, or callus. ? You have a fever. ? You have a red line going up your leg. Summary  Check your feet every day  for cuts, sores, red spots, swelling, and blisters.  Moisturize feet and legs daily.  Wear shoes that fit properly and have enough cushioning.  If you have foot problems, report any cuts, sores, or bruises to your health care provider immediately.  Schedule a complete foot exam at least once a year (annually) or more often if you have foot problems. This information is not intended to replace advice given to you by your health care provider. Make sure you discuss any questions you have with your health care provider. Document Revised: 05/22/2019 Document Reviewed: 09/30/2016 Elsevier Patient Education  2020 [employer] Inc.  

## 2020-01-13 NOTE — Progress Notes (Signed)
Subjective: [name] [name] is a 59 y.o. male patient seen today preventative diabetic foot care and painful mycotic nails b/l that are difficult to trim. Pain interferes with ambulation. Aggravating factors include wearing enclosed shoe gear. Pain is relieved with periodic professional debridement  Patient Active Problem List   Diagnosis Date Noted  . Antiplatelet or antithrombotic long-term use 12/23/2019  . Mild nonproliferative diabetic retinopathy of both eyes without macular edema associated with type 2 diabetes mellitus ([place]) 04/30/2019  . Primary insomnia 02/25/2019  . Tinea pedis of both feet 08/23/2017  . Depression, acute 02/17/2016  . Hearing difficulty 12/10/2014  . COPD, moderate ([place]) 11/22/2014  . Angina, class II ([place]) 08/01/2014  . H/O NSTEMI (non-ST elevated myocardial infarction) 11/07/2013  . Pre-ulcerative corn or callous 10/12/2012  . History of tobacco use 08/24/2012  . Obesity 07/31/2012  . Insulin dependent type 2 diabetes mellitus ([place]) 06/05/2012  . CAD S/P percutaneous coronary angioplasty 01/21/2012  . OSA on CPAP 01/19/2012  . Essential hypertension 01/18/2012  . Hyperlipidemia with target LDL less than 70 01/18/2012    Current Outpatient Medications on File Prior to Visit  Medication Sig Dispense Refill  . ACCU-CHEK AVIVA PLUS test strip USE ONE STRIP TO TEST FOUR TIMES A DAY 100 strip 10  . ACCU-CHEK FASTCLIX LANCETS MISC 1 Units by Percutaneous route 4 (four) times daily. 100 each 12  . albuterol (VENTOLIN HFA) 108 (90 Base) MCG/ACT inhaler INHALE TWO PUFFS BY MOUTH EVERY 6 HOURS AS NEEDED FOR WHEEZING OR FOR SHORTNESS OF BREATH 6.7 g 0  . aspirin 81 MG chewable tablet Chew 1 tablet (81 mg total) by mouth daily. 180 tablet 2  . atorvastatin (LIPITOR) 80 MG tablet TAKE ONE TABLET BY MOUTH DAILY 90 tablet 2  . BD INSULIN SYRINGE U/F 31G X 5/16" 1 ML MISC USE UP TO FIVE TIMES A DAY WITH NOVOLOG AND LANTUS DOSING 100 each 1  . BD ULTRA-FINE PEN NEEDLES 29G X  12.7MM MISC USE TO WITH VICTOZA PEN, INJECTION ONCE DAILY. 100 each 9  . Blood Glucose Monitoring Suppl (ACCU-CHEK AVIVA PLUS) w/Device KIT CHECK BLOOD GLUCOSE FASTING AND 2 HOURS AFTER [facility] AND DINNER 1 kit 0  . buPROPion (WELLBUTRIN XL) 150 MG 24 hr tablet TAKE ONE TABLET BY MOUTH DAILY 30 tablet 0  . carvedilol (COREG) 3.125 MG tablet TAKE ONE TABLET BY MOUTH TWICE A DAY WITH MEALS 60 tablet 0  . ciprofloxacin-dexamethasone (CIPRODEX) OTIC suspension Place 4 drops into the left ear 2 (two) times daily.    . citalopram (CELEXA) 20 MG tablet Take 1 tablet (20 mg total) by mouth daily. 90 tablet 1  . empagliflozin (JARDIANCE) 25 MG TABS tablet Take 25 mg by mouth daily. 90 tablet 3  . insulin aspart (NOVOLOG) 100 UNIT/ML injection Inject 45 Units into the skin 2 (two) times daily with a meal. (Patient taking differently: Inject 30 Units into the skin 2 (two) times daily with a meal. )    . insulin glargine (LANTUS) 100 UNIT/ML injection Inject 0.45 mLs (45 Units total) into the skin 2 (two) times daily.    Marland Kitchen liraglutide (VICTOZA) 18 MG/3ML SOPN Inject 0.3 mLs (1.8 mg total) into the skin daily. INJECT 0.3 MLS (1.8 MG TOTAL) INTO THE SKIN DAILY 9 pen 3  . lisinopril (ZESTRIL) 2.5 MG tablet Take 1 tablet (2.5 mg total) by mouth daily.    . Melatonin 2.5 MG CAPS Take 5 mg by mouth at bedtime.    . metFORMIN (GLUCOPHAGE) 1000  MG tablet TAKE ONE TABLET BY MOUTH TWICE A DAY WITH MEALS 180 tablet 9  . mometasone-formoterol (DULERA) 200-5 MCG/ACT AERO INHALE TWO PUFFS BY MOUTH TWICE A DAY 39 g 2  . Multiple Vitamin (MULTIVITAMIN) tablet Take 1 tablet by mouth daily.    . nitroGLYCERIN (NITROSTAT) 0.4 MG SL tablet DISSOLVE 1 TAB UNDER TONGUE FOR CHEST PAIN - IF PAIN REMAINS AFTER 5 MIN, CALL 911 AND REPEAT DOSE. MAX 3 TABS IN 15 MINUTES (Patient not taking: Reported on 12/24/2019) 100 tablet 0  . Omega-3 Fatty Acids (FISH OIL) 1000 MG CAPS Take 1 capsule (1,000 mg total) by mouth daily.    .  prasugrel (EFFIENT) 10 MG TABS tablet TAKE ONE TABLET BY MOUTH DAILY 90 tablet 2  . traMADol (ULTRAM) 50 MG tablet TAKE ONE TABLET BY MOUTH EVERY 6 HOURS AS NEEDED 20 tablet 0  . [DISCONTINUED] Blood Glucose Monitoring Suppl (ACCU-CHEK AVIVA PLUS) w/Device KIT CHECK BLOOD GLUCOSE FASTING AND 2 HOURS AFTER BREAKFAST, LUNCH AND DINNER 1 kit 0   No current facility-administered medications on file prior to visit.    Allergies  Allergen Reactions  . Shellfish Allergy Nausea And Vomiting    OYSTERS    Objective: Physical Exam  General: Well developed, nourished, no acute distress, awake, alert and oriented x 3  Vascular:  Capillary refill time to digits immediate b/l. Palpable DP pulses b/l. Palpable PT pulses b/l. Pedal hair sparse b/l. Skin temperature gradient within normal limits b/l.  Dermatological:  Pedal skin with normal turgor, texture and tone bilaterally. No open wounds bilaterally. No interdigital macerations bilaterally. Toenails 1-5 b/l elongated, dystrophic, thickened, crumbly with subungual debris and tenderness to dorsal palpation.  Musculoskeletal:  Normal muscle strength 5/5 to all lower extremity muscle groups bilaterally. No gross bony deformities bilaterally. No pain crepitus or joint limitation noted with ROM b/l.  Neurological:  Protective sensation diminished with 10g monofilament b/l. Vibratory sensation diminished b/l.  Assessment and Plan:  Problem List Items Addressed This Visit    None    Visit Diagnoses    Pain due to onychomycosis of toenails of both feet    -  Primary   Type 2 diabetes mellitus without complication, with long-term current use of insulin ([place])         -Examined patient.  -No new findings. No new orders. -Discussed treatment options for painful mycotic nails. -Mechanically debrided and reduced mycotic nails with sterile nail nipper and dremel nail file without incident. -Continue diabetic foot care principles. Literature dispensed  on today.  -Patient to continue soft, supportive shoe gear daily. -Patient to report any pedal injuries to medical professional immediately. -Patient/POA to call should there be question/concern in the interim.  Return in about 3 months (around 04/14/2020) for diabetic nail trim.  Marzetta Board, DPM

## 2020-01-15 ENCOUNTER — Ambulatory Visit: Payer: Medicaid Other | Attending: Internal Medicine

## 2020-01-15 DIAGNOSIS — Z23 Encounter for immunization: Secondary | ICD-10-CM

## 2020-01-15 NOTE — Progress Notes (Signed)
   Covid-19 Vaccination Clinic  Name:  [name] [name]    MRN: [id number] DOB: 03-Apr-1961  01/15/2020  [name] was observed post Covid-19 immunization for 15 minutes without incident. He was provided with Vaccine Information Sheet and instruction to access the V-Safe system.   [name] was instructed to call 911 with any severe reactions post vaccine: Marland Kitchen Difficulty breathing  . Swelling of face and throat  . A fast heartbeat  . A bad rash all over body  . Dizziness and weakness   Immunizations Administered    Name Date Dose VIS Date Route   Pfizer COVID-19 Vaccine 01/15/2020  4:05 PM 0.3 mL 11/06/2018 Intramuscular   Manufacturer: [place]   Lot: [id number]   [place]: [id number]

## 2020-01-19 ENCOUNTER — Other Ambulatory Visit: Payer: Self-pay | Admitting: Family Medicine

## 2020-01-19 DIAGNOSIS — I214 Non-ST elevation (NSTEMI) myocardial infarction: Secondary | ICD-10-CM

## 2020-01-19 DIAGNOSIS — I10 Essential (primary) hypertension: Secondary | ICD-10-CM

## 2020-01-21 ENCOUNTER — Other Ambulatory Visit: Payer: Self-pay | Admitting: Family Medicine

## 2020-01-21 DIAGNOSIS — J449 Chronic obstructive pulmonary disease, unspecified: Secondary | ICD-10-CM

## 2020-01-26 ENCOUNTER — Other Ambulatory Visit: Payer: Self-pay | Admitting: Family Medicine

## 2020-01-26 DIAGNOSIS — I10 Essential (primary) hypertension: Secondary | ICD-10-CM

## 2020-01-26 DIAGNOSIS — I2510 Atherosclerotic heart disease of native coronary artery without angina pectoris: Secondary | ICD-10-CM

## 2020-01-29 ENCOUNTER — Ambulatory Visit (AMBULATORY_SURGERY_CENTER): Payer: Medicaid Other | Admitting: Gastroenterology

## 2020-01-29 ENCOUNTER — Other Ambulatory Visit: Payer: Self-pay

## 2020-01-29 ENCOUNTER — Encounter (HOSPITAL_COMMUNITY): Payer: Self-pay

## 2020-01-29 ENCOUNTER — Emergency Department (HOSPITAL_COMMUNITY)
Admission: EM | Admit: 2020-01-29 | Discharge: 2020-01-29 | Disposition: A | Discharge status: Home / Self Care | Payer: Medicaid Other | Attending: Emergency Medicine | Admitting: Emergency Medicine

## 2020-01-29 ENCOUNTER — Emergency Department (HOSPITAL_COMMUNITY): Payer: Medicaid Other

## 2020-01-29 ENCOUNTER — Encounter: Payer: Self-pay | Admitting: Gastroenterology

## 2020-01-29 VITALS — BP 130/64 | HR 71 | Temp 96.9°F | Resp 24 | Ht 65.0 in | Wt 288.0 lb

## 2020-01-29 DIAGNOSIS — J441 Chronic obstructive pulmonary disease with (acute) exacerbation: Secondary | ICD-10-CM

## 2020-01-29 DIAGNOSIS — Z1211 Encounter for screening for malignant neoplasm of colon: Secondary | ICD-10-CM

## 2020-01-29 DIAGNOSIS — I10 Essential (primary) hypertension: Secondary | ICD-10-CM | POA: Insufficient documentation

## 2020-01-29 DIAGNOSIS — D123 Benign neoplasm of transverse colon: Secondary | ICD-10-CM

## 2020-01-29 DIAGNOSIS — R0602 Shortness of breath: Secondary | ICD-10-CM | POA: Diagnosis present

## 2020-01-29 DIAGNOSIS — E119 Type 2 diabetes mellitus without complications: Secondary | ICD-10-CM | POA: Diagnosis not present

## 2020-01-29 DIAGNOSIS — F17220 Nicotine dependence, chewing tobacco, uncomplicated: Secondary | ICD-10-CM | POA: Insufficient documentation

## 2020-01-29 DIAGNOSIS — Z794 Long term (current) use of insulin: Secondary | ICD-10-CM | POA: Insufficient documentation

## 2020-01-29 DIAGNOSIS — D126 Benign neoplasm of colon, unspecified: Secondary | ICD-10-CM

## 2020-01-29 DIAGNOSIS — Z955 Presence of coronary angioplasty implant and graft: Secondary | ICD-10-CM | POA: Diagnosis not present

## 2020-01-29 DIAGNOSIS — D120 Benign neoplasm of cecum: Secondary | ICD-10-CM

## 2020-01-29 DIAGNOSIS — Z79899 Other long term (current) drug therapy: Secondary | ICD-10-CM | POA: Insufficient documentation

## 2020-01-29 DIAGNOSIS — I2510 Atherosclerotic heart disease of native coronary artery without angina pectoris: Secondary | ICD-10-CM | POA: Diagnosis not present

## 2020-01-29 DIAGNOSIS — R0789 Other chest pain: Secondary | ICD-10-CM | POA: Insufficient documentation

## 2020-01-29 DIAGNOSIS — D124 Benign neoplasm of descending colon: Secondary | ICD-10-CM

## 2020-01-29 DIAGNOSIS — I252 Old myocardial infarction: Secondary | ICD-10-CM | POA: Diagnosis not present

## 2020-01-29 DIAGNOSIS — D122 Benign neoplasm of ascending colon: Secondary | ICD-10-CM

## 2020-01-29 DIAGNOSIS — D127 Benign neoplasm of rectosigmoid junction: Secondary | ICD-10-CM

## 2020-01-29 LAB — CBC WITH DIFFERENTIAL/PLATELET
Abs Immature Granulocytes: 0.03 10*3/uL (ref 0.00–0.07)
Basophils Absolute: 0.1 10*3/uL (ref 0.0–0.1)
Basophils Relative: 1 %
Eosinophils Absolute: 0.2 10*3/uL (ref 0.0–0.5)
Eosinophils Relative: 2 %
HCT: 46.5 % (ref 39.0–52.0)
Hemoglobin: 15.2 g/dL (ref 13.0–17.0)
Immature Granulocytes: 0 %
Lymphocytes Relative: 14 %
Lymphs Abs: 1.4 10*3/uL (ref 0.7–4.0)
MCH: 30.6 pg (ref 26.0–34.0)
MCHC: 32.7 g/dL (ref 30.0–36.0)
MCV: 93.8 fL (ref 80.0–100.0)
Monocytes Absolute: 1 10*3/uL (ref 0.1–1.0)
Monocytes Relative: 10 %
Neutro Abs: 7.2 10*3/uL (ref 1.7–7.7)
Neutrophils Relative %: 73 %
Platelets: 218 10*3/uL (ref 150–400)
RBC: 4.96 MIL/uL (ref 4.22–5.81)
RDW: 13.4 % (ref 11.5–15.5)
WBC: 9.8 10*3/uL (ref 4.0–10.5)
nRBC: 0 % (ref 0.0–0.2)

## 2020-01-29 LAB — BASIC METABOLIC PANEL
Anion gap: 8 (ref 5–15)
BUN: 11 mg/dL (ref 6–20)
CO2: 26 mmol/L (ref 22–32)
Calcium: 8.2 mg/dL — ABNORMAL LOW (ref 8.9–10.3)
Chloride: 104 mmol/L (ref 98–111)
Creatinine, Ser: 0.72 mg/dL (ref 0.61–1.24)
GFR calc Af Amer: >60 mL/min (ref >60)
GFR calc non Af Amer: >60 mL/min (ref >60)
Glucose, Bld: 260 mg/dL — ABNORMAL HIGH (ref 70–99)
Potassium: 4.2 mmol/L (ref 3.5–5.1)
Sodium: 138 mmol/L (ref 135–145)

## 2020-01-29 LAB — TROPONIN I (HIGH SENSITIVITY): Troponin I (High Sensitivity): 8 ng/L (ref <18)

## 2020-01-29 MED ORDER — PREDNISONE 10 MG PO TABS
40.0000 mg | ORAL_TABLET | Freq: Every day | ORAL | 0 refills | Status: AC
Start: 2020-01-29 — End: 2020-02-02

## 2020-01-29 MED ORDER — METHYLPREDNISOLONE SODIUM SUCC 125 MG IJ SOLR
125.0000 mg | Freq: Once | INTRAMUSCULAR | Status: AC
  Administered 2020-01-29: 125 mg via INTRAVENOUS
  Filled 2020-01-29: qty 2

## 2020-01-29 MED ORDER — SODIUM CHLORIDE 0.9 % IV SOLN: 500.0000 mL | INTRAVENOUS | Status: DC

## 2020-01-29 MED ORDER — ALBUTEROL SULFATE HFA 108 (90 BASE) MCG/ACT IN AERS
2.0000 | INHALATION_SPRAY | Freq: Once | RESPIRATORY_TRACT | Status: AC
  Administered 2020-01-29: 2 via RESPIRATORY_TRACT
  Filled 2020-01-29: qty 6.7

## 2020-01-29 NOTE — Progress Notes (Signed)
pt tolerated well. VSS. awake and to recovery. Report given to RN.  

## 2020-01-29 NOTE — Progress Notes (Signed)
Temp JB V/s DT  

## 2020-01-29 NOTE — ED Provider Notes (Signed)
[facility] DEPT Provider Note   CSN: [id number] Arrival date & time: 01/29/20  1726     History Chief Complaint  Patient presents with  . Shortness of Breath    [name] [name] is a 59 y.o. male with past medical history of COPD, CAD, diabetes, hypertension, alcohol abuse presenting to the ED with a chief complaint of shortness of breath.  Patient was seen and evaluated by his GI provider this morning and underwent a colonoscopy.  States that he did well after this.  He was at [facility] trying to get dinner when he began feeling short of breath and having wheezing.  He did not have his albuterol inhaler on hand as he states that he ran out several weeks ago.  He states that this feels similar to his COPD exacerbations.  He was given DuoNeb by EMS and reports significant improvement related to this.  He does not wear supplemental oxygen at home.  Reports wheezing, cough.  He denies chest pain, fever, abdominal pain, vomiting, leg swelling.  HPI     Past Medical History:  Diagnosis Date  . Alcoholism ([place])   . Anxiety   . Arthritis    "qwhere"  . Asthma   . CAD S/P percutaneous coronary angioplasty 01/18/12; 10/2013   a. pRCA 3.5 x 18 vision BMS - 4.2 mm; b. 2/'15: mCx 3.5 x 12  Rebel BMS (3.6-3.7 mm)  . Chronic back pain    "mid/lower" (08/01/2014)  . COPD (chronic obstructive pulmonary disease) ([place])    "I'm seeing COPD dr now; don't know if I've got it" (08/01/2014)  . Depression   . GERD (gastroesophageal reflux disease)   . Hyperlipidemia   . Hypertension   . Migraines    "once in awhile" (08/01/2014)  . Non-ST elevation myocardial infarction (NSTEMI) ([place]) 10/2013  . OSA on CPAP 01/19/2012  . Sleep apnea   . Type II diabetes mellitus [facility])     Patient Active Problem List   Diagnosis Date Noted  . Antiplatelet or antithrombotic long-term use 12/23/2019  . Mild nonproliferative diabetic retinopathy of both eyes without macular edema associated with  type 2 diabetes mellitus ([place]) 04/30/2019  . Primary insomnia 02/25/2019  . Tinea pedis of both feet 08/23/2017  . Depression, acute 02/17/2016  . Hearing difficulty 12/10/2014  . COPD, moderate ([place]) 11/22/2014  . Angina, class II ([place]) 08/01/2014  . H/O NSTEMI (non-ST elevated myocardial infarction) 11/07/2013  . Pre-ulcerative corn or callous 10/12/2012  . History of tobacco use 08/24/2012  . Obesity 07/31/2012  . Insulin dependent type 2 diabetes mellitus ([place]) 06/05/2012  . CAD S/P percutaneous coronary angioplasty 01/21/2012  . OSA on CPAP 01/19/2012  . Essential hypertension 01/18/2012  . Hyperlipidemia with target LDL less than 70 01/18/2012    Past Surgical History:  Procedure Laterality Date  . CARDIAC CATHETERIZATION  07/2014   Left Main: Short, large-caliber vessel. Widely patent. Bifurcates into the LAD and Circumflex. Angiographically normal.  . CORONARY ANGIOPLASTY WITH STENT PLACEMENT  01/2012; 11/07/2013; 08/01/2014   "1 + 2 + 1"   . CORONARY ANGIOPLASTY WITH STENT PLACEMENT  07/2014   Severe single-vessel disease involving the bifurcation of OM1 and OM 2 with 99% in-stent restenosis in the bare-metal stent placed in the OM 2.  . LEFT HEART CATHETERIZATION WITH CORONARY ANGIOGRAM N/A 01/20/2012   Procedure: LEFT HEART CATHETERIZATION WITH CORONARY ANGIOGRAM;  Surgeon: Jettie Booze, MD;  Location: [facility] CATH LAB;  Service: Cardiovascular;  Laterality: N/A;  .  LEFT HEART CATHETERIZATION WITH CORONARY ANGIOGRAM N/A 11/07/2013   Procedure: LEFT HEART CATHETERIZATION WITH CORONARY ANGIOGRAM;  Surgeon: Jettie Booze, MD;  Location: [facility] CATH LAB;  Service: Cardiovascular;  Laterality: N/A;  . LEFT HEART CATHETERIZATION WITH CORONARY ANGIOGRAM N/A 08/01/2014   Procedure: LEFT HEART CATHETERIZATION WITH CORONARY ANGIOGRAM;  Surgeon: Leonie Man, MD;  Location: [facility] CATH LAB;  Service: Cardiovascular;  Laterality: N/A;  . MULTIPLE TOOTH EXTRACTIONS    . PERCUTANEOUS  CORONARY STENT INTERVENTION (PCI-S)  01/20/2012   Procedure: PERCUTANEOUS CORONARY STENT INTERVENTION (PCI-S);  Surgeon: Jettie Booze, MD;  Location[facility] CATH LAB;  Service: Cardiovascular;;  . PERCUTANEOUS CORONARY STENT INTERVENTION (PCI-S)  11/07/2013   Procedure: PERCUTANEOUS CORONARY STENT INTERVENTION (PCI-S);  Surgeon: Jettie Booze, MD;  Location: [facility] CATH LAB;  Service: Cardiovascular;;       Family History  Problem Relation Age of Onset  . Heart attack Father 61  . Breast cancer Mother   . Hypertension Sister   . Hyperlipidemia Sister   . Hyperlipidemia Sister   . Hypertension Sister   . Heart attack Brother   . Stroke Brother   . Diabetes Brother   . Diabetes Sister     Social History   Tobacco Use  . Smoking status: Former Smoker    Packs/day: 1.00    Years: 42.00    Pack years: 42.00    Types: Cigarettes    Start date: 09/13/1975    Quit date: 01/25/2018    Years since quitting: 2.0  . Smokeless tobacco: Current User    Types: Chew  . Tobacco comment: Bupropion helping  Substance Use Topics  . Alcohol use: Yes    Alcohol/week: 6.0 standard drinks    Types: 6 Cans of beer per week    Comment: pt reports "I quit 66 days ago" (05/09/16)  . Drug use: No    Comment: Crack Cocaine (denies 05/09/16)    Home Medications Prior to Admission medications   Medication Sig Start Date End Date Taking? Authorizing Provider  ACCU-CHEK AVIVA PLUS test strip USE ONE STRIP TO TEST FOUR TIMES A DAY 11/05/19   [name], MD  ACCU-CHEK FASTCLIX LANCETS MISC 1 Units by Percutaneous route 4 (four) times daily. 07/01/15   [name], MD  albuterol (PROVENTIL HFA) 108 (90 Base) MCG/ACT inhaler INHALE 2 PUFFS INTO THE LUNGS EVERY 6 HOURS AS NEEDED FOR WHEEZING OR FOR SHORTNESS OF BREATH Patient not taking: Reported on 01/29/2020 01/22/20   Lind Covert, MD  aspirin 81 MG chewable tablet Chew 1 tablet (81 mg total) by mouth daily. 10/04/13   [name], MD    atorvastatin (LIPITOR) 80 MG tablet TAKE ONE TABLET BY MOUTH DAILY 04/07/19   [name], MD  BD INSULIN SYRINGE U/F 31G X 5/16" 1 ML MISC USE UP TO FIVE TIMES A DAY WITH NOVOLOG AND LANTUS DOSING 12/12/19   [name], MD  BD ULTRA-FINE PEN NEEDLES 29G X 12.7MM MISC USE TO WITH VICTOZA PEN, INJECTION ONCE DAILY. 03/08/19   [name], MD  Blood Glucose Monitoring Suppl (ACCU-CHEK AVIVA PLUS) w/Device KIT CHECK BLOOD GLUCOSE FASTING AND 2 HOURS AFTER [facility] AND DINNER 05/30/19   [name], MD  buPROPion (WELLBUTRIN XL) 150 MG 24 hr tablet TAKE ONE TABLET BY MOUTH DAILY 10/02/19   [name], MD  carvedilol (COREG) 3.125 MG tablet TAKE ONE TABLET BY MOUTH TWICE A DAY WITH MEALS 01/27/20   Kennon Rounds,  Standley Dakins, MD  ciprofloxacin-dexamethasone (CIPRODEX) OTIC suspension Place 4 drops into the left ear 2 (two) times daily.    [provider]  citalopram (CELEXA) 20 MG tablet Take 1 tablet (20 mg total) by mouth daily. 10/29/19   Zenia Resides, MD  empagliflozin (JARDIANCE) 25 MG TABS tablet Take 25 mg by mouth daily. 01/31/19   Zenia Resides, MD  insulin aspart (NOVOLOG) 100 UNIT/ML injection Inject 45 Units into the skin 2 (two) times daily with a meal. Patient taking differently: Inject 30 Units into the skin 2 (two) times daily with a meal.  08/01/19   Hensel, Jamal Collin, MD  insulin glargine (LANTUS) 100 UNIT/ML injection Inject 0.45 mLs (45 Units total) into the skin 2 (two) times daily. 08/01/19   Zenia Resides, MD  liraglutide (VICTOZA) 18 MG/3ML SOPN Inject 0.3 mLs (1.8 mg total) into the skin daily. INJECT 0.3 MLS (1.8 MG TOTAL) INTO THE SKIN DAILY 10/29/19   Zenia Resides, MD  lisinopril (ZESTRIL) 2.5 MG tablet Take 1 tablet (2.5 mg total) by mouth daily. 08/01/19   Zenia Resides, MD  Melatonin 2.5 MG CAPS Take 5 mg by mouth at bedtime.    [provider]  metFORMIN (GLUCOPHAGE) 1000 MG tablet TAKE ONE TABLET BY MOUTH TWICE A DAY WITH MEALS  12/02/19   [name], MD  mometasone-formoterol (DULERA) 200-5 MCG/ACT AERO INHALE TWO PUFFS BY MOUTH TWICE A DAY Patient not taking: Reported on 01/29/2020 08/26/19   Kinnie Feil, MD  Multiple Vitamin (MULTIVITAMIN) tablet Take 1 tablet by mouth daily.    [provider]  nitroGLYCERIN (NITROSTAT) 0.4 MG SL tablet DISSOLVE 1 TAB UNDER TONGUE FOR CHEST PAIN - IF PAIN REMAINS AFTER 5 MIN, CALL 911 AND REPEAT DOSE. MAX 3 TABS IN 15 MINUTES Patient not taking: Reported on 01/29/2020 01/21/20   [name], MD  Omega-3 Fatty Acids (FISH OIL) 1000 MG CAPS Take 1 capsule (1,000 mg total) by mouth daily. 08/01/19   Zenia Resides, MD  prasugrel (EFFIENT) 10 MG TABS tablet TAKE ONE TABLET BY MOUTH DAILY 06/25/19   Almyra Deforest, PA  predniSONE (DELTASONE) 10 MG tablet Take 4 tablets (40 mg total) by mouth daily for 4 days. 01/29/20 02/02/20  Emmani Lesueur, PA-C  traMADol (ULTRAM) 50 MG tablet TAKE ONE TABLET BY MOUTH EVERY 6 HOURS AS NEEDED Patient not taking: Reported on 01/29/2020 12/12/19   [name], MD  Blood Glucose Monitoring Suppl (ACCU-CHEK AVIVA PLUS) w/Device KIT CHECK BLOOD GLUCOSE FASTING AND 2 HOURS AFTER BREAKFAST, LUNCH AND DINNER 05/11/18   [name], MD    Allergies    Shellfish allergy  Review of Systems   Review of Systems  Constitutional: Negative for appetite change, chills and fever.  HENT: Negative for ear pain, rhinorrhea, sneezing and sore throat.   Eyes: Negative for photophobia and visual disturbance.  Respiratory: Positive for cough, chest tightness, shortness of breath and wheezing.   Cardiovascular: Negative for chest pain and palpitations.  Gastrointestinal: Negative for abdominal pain, blood in stool, constipation, diarrhea, nausea and vomiting.  Genitourinary: Negative for dysuria, hematuria and urgency.  Musculoskeletal: Negative for myalgias.  Skin: Negative for rash.  Neurological: Negative for dizziness, weakness and light-headedness.     Physical Exam Updated Vital Signs BP (!) 129/58   Pulse 84   Temp 98.5 F (36.9 C) (Oral)   Resp 13   SpO2 97%   Physical Exam Vitals and nursing note reviewed.  Constitutional:      General: He is not in acute distress.    Appearance: He is well-developed.  HENT:     Head: Normocephalic and atraumatic.     Nose: Nose normal.  Eyes:     General: No scleral icterus.       Left eye: No discharge.     Conjunctiva/sclera: Conjunctivae normal.  Cardiovascular:     Rate and Rhythm: Normal rate and regular rhythm.     Heart sounds: Normal heart sounds. No murmur. No friction rub. No gallop.   Pulmonary:     Effort: Pulmonary effort is normal. No respiratory distress.     Breath sounds: Normal breath sounds.  Abdominal:     General: Bowel sounds are normal. There is no distension.     Palpations: Abdomen is soft.     Tenderness: There is no abdominal tenderness. There is no guarding.  Musculoskeletal:        General: Normal range of motion.     Cervical back: Normal range of motion and neck supple.  Skin:    General: Skin is warm and dry.     Findings: No rash.  Neurological:     Mental Status: He is alert.     Motor: No abnormal muscle tone.     Coordination: Coordination normal.     ED Results / Procedures / Treatments   Labs (all labs ordered are listed, but only abnormal results are displayed) Labs Reviewed  BASIC METABOLIC PANEL - Abnormal; Notable for the following components:      Result Value   Glucose, Bld 260 (*)    Calcium 8.2 (*)    All other components within normal limits  CBC WITH DIFFERENTIAL/PLATELET  TROPONIN I (HIGH SENSITIVITY)    EKG ED ECG REPORT   Date: 01/29/2020  Rate: 89  Rhythm: normal sinus rhythm  QRS Axis: normal  Intervals: normal  ST/T Wave abnormalities: normal  Conduction Disutrbances:none  Narrative Interpretation:   Old EKG Reviewed: none available  I have personally reviewed the EKG tracing and agree with the  computerized printout as noted.   Radiology DG Chest 2 View  Result Date: 01/29/2020 CLINICAL DATA:  Shortness of breath. COPD. EXAM: CHEST - 2 VIEW COMPARISON:  11/07/2016 FINDINGS: Mild cardiomegaly with unchanged mediastinal contours. Peribronchial thickening. No acute airspace disease. No pneumothorax or pleural effusion, the costophrenic angles are partially excluded from the field of view on the lateral. Degenerative change in the thoracic spine. IMPRESSION: 1. Peribronchial thickening, can be seen with bronchitis, asthma, or smoking related lung disease. 2. Mild cardiomegaly. Electronically Signed   By: Keith Rake M.D.   On: 01/29/2020 18:56    Procedures Procedures (including critical care time)  Medications Ordered in ED Medications  albuterol (VENTOLIN HFA) 108 (90 Base) MCG/ACT inhaler 2 puff (2 puffs Inhalation Given 01/29/20 1851)  methylPREDNISolone sodium succinate (SOLU-MEDROL) 125 mg/2 mL injection 125 mg (125 mg Intravenous Given 01/29/20 1851)    ED Course  I have reviewed the triage vital signs and the nursing notes.  Pertinent labs & imaging results that were available during my care of the patient were reviewed by me and considered in my medical decision making (see chart for details).    MDM Rules/Calculators/A&P                      60 year old male with past medical history of COPD, CAD, diabetes, hypertension presenting to the ED with a chief complaint of  shortness of breath.  He was seen and evaluated by his GI provider this morning underwent a colonoscopy.  He felt fine after this and was discharged home.  He went to [facility] for dinner when he began feeling short of breath and having wheezing.  He ran out of his albuterol inhaler several weeks ago.  States that this feels similar to the COPD exacerbations.  He was given DuoNeb by EMS and reports significant improvement.  He does not wear supplemental oxygen at baseline.  On my exam with clear breath sounds  bilaterally.  He denies any chest pain.  He does not appear fluid overloaded.  Oxygen saturations above 95% on room air.  EKG shows sinus rhythm.  CBC, BMP and troponin unremarkable.  Chest x-ray shows findings consistent with bronchitis/asthma.  No evidence of pneumonia.  He denies any productive cough.  Patient was given refill of albuterol here as well as Solu-Medrol.  Suspect that symptoms are related to his COPD exacerbation.  Will discharge home with remainder of steroid course as well as PCP follow-up.  Patient not requiring supplemental oxygen at this time.  He is comfortable with discharge home.  All imaging, if done today, including plain films, CT scans, and ultrasounds, independently reviewed by me, and interpretations confirmed via formal radiology reads.  Patient is hemodynamically stable, in NAD. Evaluation does not show pathology that would require ongoing emergent intervention or inpatient treatment. I explained the diagnosis to the patient. Pain has been managed and has no complaints prior to discharge. Patient is comfortable with above plan and is stable for discharge at this time. All questions were answered prior to disposition. Strict return precautions for returning to the ED were discussed. Encouraged follow up with PCP.   An After Visit Summary was printed and given to the patient.   Portions of this note were generated with [employer]. Dictation errors may occur despite best attempts at proofreading.  Final Clinical Impression(s) / ED Diagnoses Final diagnoses:  COPD exacerbation ([place])    Rx / DC Orders ED Discharge Orders         Ordered    predniSONE (DELTASONE) 10 MG tablet  Daily     01/29/20 2123           Delia Heady, PA-C 01/29/20 2152    Drenda Freeze, MD 01/29/20 [phone]

## 2020-01-29 NOTE — Discharge Instructions (Signed)
Use your inhaler as needed. Begin taking the steroids tomorrow to help with your COPD flare.  You need to monitor your blood sugar as the steroids may increase it. Follow-up with your primary care provider. Return to the ER for worsening shortness of breath, wheezing, chest tightness or chest pain.

## 2020-01-29 NOTE — Progress Notes (Signed)
Called to room to assist during endoscopic procedure.  Patient ID and intended procedure confirmed with present staff. Received instructions for my participation in the procedure from the performing physician.  

## 2020-01-29 NOTE — Patient Instructions (Signed)
Thank you for allowing [place] to care for you today!  Await lab results of the polyps removed today.  We will send you a letter in 1-2 weeks telling you the results.  Resume all of your medications today except Effient.  You may re-start Effient tomorrow, Thursday May 20.  Resume your previous diet today  Return to your normal activities tomorrow.       YOU HAD AN ENDOSCOPIC PROCEDURE TODAY AT [facility]:   Refer to the procedure report that was given to you for any specific questions about what was found during the examination.  If the procedure report does not answer your questions, please call your gastroenterologist to clarify.  If you requested that your care partner not be given the details of your procedure findings, then the procedure report has been included in a sealed envelope for you to review at your convenience later.  YOU SHOULD EXPECT: Some feelings of bloating in the abdomen. Passage of more gas than usual.  Walking can help get rid of the air that was put into your GI tract during the procedure and reduce the bloating. If you had a lower endoscopy (such as a colonoscopy or flexible sigmoidoscopy) you may notice spotting of blood in your stool or on the toilet paper. If you underwent a bowel prep for your procedure, you may not have a normal bowel movement for a few days.  Please Note:  You might notice some irritation and congestion in your nose or some drainage.  This is from the oxygen used during your procedure.  There is no need for concern and it should clear up in a day or so.  SYMPTOMS TO REPORT IMMEDIATELY:   Following lower endoscopy (colonoscopy or flexible sigmoidoscopy):  Excessive amounts of blood in the stool  Significant tenderness or worsening of abdominal pains  Swelling of the abdomen that is new, acute  Fever of 100F or higher   For urgent or emergent issues, a gastroenterologist can be reached at any hour by calling [phone]  [phone]. Do not use MyChart messaging for urgent concerns.    DIET:  We do recommend a small meal at first, but then you may proceed to your regular diet.  Drink plenty of fluids but you should avoid alcoholic beverages for 24 hours.  ACTIVITY:  You should plan to take it easy for the rest of today and you should NOT DRIVE or use heavy machinery until tomorrow (because of the sedation medicines used during the test).    FOLLOW UP: Our staff will call the number listed on your records 48-72 hours following your procedure to check on you and address any questions or concerns that you may have regarding the information given to you following your procedure. If we do not reach you, we will leave a message.  We will attempt to reach you two times.  During this call, we will ask if you have developed any symptoms of COVID 19. If you develop any symptoms (ie: fever, flu-like symptoms, shortness of breath, cough etc.) before then, please call [phone].  If you test positive for Covid 19 in the 2 weeks post procedure, please call and report this information to [place].    If any biopsies were taken you will be contacted by phone or by letter within the next 1-3 weeks.  Please call us at [phone] if you have not heard about the biopsies in 3 weeks.    SIGNATURES/CONFIDENTIALITY: You and/or your care  partner have signed paperwork which will be entered into your electronic medical record.  These signatures attest to the fact that that the information above on your After Visit Summary has been reviewed and is understood.  Full responsibility of the confidentiality of this discharge information lies with you and/or your care-partner.YOU HAD AN ENDOSCOPIC PROCEDURE TODAY AT [facility]:   Refer to the procedure report that was given to you for any specific questions about what was found during the examination.  If the procedure report does not answer your questions, please call your  gastroenterologist to clarify.  If you requested that your care partner not be given the details of your procedure findings, then the procedure report has been included in a sealed envelope for you to review at your convenience later.  YOU SHOULD EXPECT: Some feelings of bloating in the abdomen. Passage of more gas than usual.  Walking can help get rid of the air that was put into your GI tract during the procedure and reduce the bloating. If you had a lower endoscopy (such as a colonoscopy or flexible sigmoidoscopy) you may notice spotting of blood in your stool or on the toilet paper. If you underwent a bowel prep for your procedure, you may not have a normal bowel movement for a few days.  Please Note:  You might notice some irritation and congestion in your nose or some drainage.  This is from the oxygen used during your procedure.  There is no need for concern and it should clear up in a day or so.  SYMPTOMS TO REPORT IMMEDIATELY:   Following lower endoscopy (colonoscopy or flexible sigmoidoscopy):  Excessive amounts of blood in the stool  Significant tenderness or worsening of abdominal pains  Swelling of the abdomen that is new, acute  Fever of 100F or higher   For urgent or emergent issues, a gastroenterologist can be reached at any hour by calling [phone]. Do not use MyChart messaging for urgent concerns.    DIET:  We do recommend a small meal at first, but then you may proceed to your regular diet.  Drink plenty of fluids but you should avoid alcoholic beverages for 24 hours.  ACTIVITY:  You should plan to take it easy for the rest of today and you should NOT DRIVE or use heavy machinery until tomorrow (because of the sedation medicines used during the test).    FOLLOW UP: Our staff will call the number listed on your records 48-72 hours following your procedure to check on you and address any questions or concerns that you may have regarding the information given to you  following your procedure. If we do not reach you, we will leave a message.  We will attempt to reach you two times.  During this call, we will ask if you have developed any symptoms of COVID 19. If you develop any symptoms (ie: fever, flu-like symptoms, shortness of breath, cough etc.) before then, please call [phone].  If you test positive for Covid 19 in the 2 weeks post procedure, please call and report this information to [place].    If any biopsies were taken you will be contacted by phone or by letter within the next 1-3 weeks.  Please call us at [phone] if you have not heard about the biopsies in 3 weeks.    SIGNATURES/CONFIDENTIALITY: You and/or your care partner have signed paperwork which will be entered into your electronic medical record.  These signatures attest to  the fact that that the information above on your After Visit Summary has been reviewed and is understood.  Full responsibility of the confidentiality of this discharge information lies with you and/or your care-partner.

## 2020-01-29 NOTE — ED Triage Notes (Signed)
Pt BIB EMS from home. Pt reports SHOB. Hx of COPD. Pt feels significantly after duoneb. Pt has a procedure soon and was told not to take any of his normal medications.  Duoneb given with EMS 3L O2 [place]   20G LAC

## 2020-01-29 NOTE — Op Note (Signed)
[facility] Patient Name: [name] [name] Procedure Date: 01/29/2020 1:57 PM MRN: [id number] Endoscopist: Viviann Spare P. Adela Lank , MD Age: 59 Referring MD:  Date of Birth: 30-May-1961 Gender: Male Account #: [id number] Procedure:                Colonoscopy Indications:              Screening for colorectal malignant neoplasm Medicines:                Monitored Anesthesia Care Procedure:                Pre-Anesthesia Assessment:                           - Prior to the procedure, a History and Physical                            was performed, and patient medications and                            allergies were reviewed. The patient's tolerance of                            previous anesthesia was also reviewed. The risks                            and benefits of the procedure and the sedation                            options and risks were discussed with the patient.                            All questions were answered, and informed consent                            was obtained. Prior Anticoagulants: The patient has                            taken antiplatelet medication, last dose was 5 days                            prior to procedure. ASA Grade Assessment: III - A                            patient with severe systemic disease. After                            reviewing the risks and benefits, the patient was                            deemed in satisfactory condition to undergo the                            procedure.  After obtaining informed consent, the colonoscope                            was passed under direct vision. Throughout the                            procedure, the patient's blood pressure, pulse, and                            oxygen saturations were monitored continuously. The                            Colonoscope was introduced through the anus and                            advanced to the the cecum, identified by                        appendiceal orifice and ileocecal valve. The                            colonoscopy was performed without difficulty. The                            patient tolerated the procedure well. The quality                            of the bowel preparation was good. The ileocecal                            valve, appendiceal orifice, and rectum were                            photographed. Scope In: 2:10:20 PM Scope Out: 2:33:52 PM Scope Withdrawal Time: 0 hours 21 minutes 9 seconds  Total Procedure Duration: 0 hours 23 minutes 32 seconds  Findings:                 The perianal and digital rectal examinations were                            normal.                           Two sessile polyps were found in the cecum. The                            polyps were 3 mm in size. These polyps were removed                            with a cold snare. Resection and retrieval were                            complete.  A 3 mm polyp was found in the ascending colon. The                            polyp was sessile. The polyp was removed with a                            cold snare. Resection and retrieval were complete.                           Two sessile polyps were found in the hepatic                            flexure. The polyps were 2 to 3 mm in size. These                            polyps were removed with a cold snare. Resection                            and retrieval were complete.                           Three sessile polyps were found in the transverse                            colon. The polyps were 3 to 5 mm in size. These                            polyps were removed with a cold snare. Resection                            and retrieval were complete.                           Two sessile polyps were found in the descending                            colon. The polyps were 2 to 3 mm in size. These                            polyps were  removed with a cold snare. Resection                            and retrieval were complete.                           A 3 mm polyp was found in the rectum. The polyp was                            sessile. The polyp was removed with a cold snare.                            Resection and retrieval  were complete.                           The exam was otherwise without abnormality. Complications:            No immediate complications. Estimated blood loss:                            Minimal. Estimated Blood Loss:     Estimated blood loss was minimal. Impression:               - Two 3 mm polyps in the cecum, removed with a cold                            snare. Resected and retrieved.                           - One 3 mm polyp in the ascending colon, removed                            with a cold snare. Resected and retrieved.                           - Two 2 to 3 mm polyps at the hepatic flexure,                            removed with a cold snare. Resected and retrieved.                           - Three 3 to 5 mm polyps in the transverse colon,                            removed with a cold snare. Resected and retrieved.                           - Two 2 to 3 mm polyps in the descending colon,                            removed with a cold snare. Resected and retrieved.                           - One 3 mm polyp in the rectum, removed with a cold                            snare. Resected and retrieved.                           - The examination was otherwise normal. Recommendation:           - Patient has a contact number available for                            emergencies. The signs and symptoms of potential  delayed complications were discussed with the                            patient. Return to normal activities tomorrow.                            Written discharge instructions were provided to the                            patient.                            - Resume previous diet.                           - Continue present medications.                           - Resume Effient tomorrow                           - Await pathology results. Viviann Spare P. Tichina Koebel, MD 01/29/2020 2:40:29 PM This report has been signed electronically.

## 2020-01-31 ENCOUNTER — Telehealth: Payer: Self-pay

## 2020-01-31 NOTE — Telephone Encounter (Signed)
2nd follow up call made.  NALM 

## 2020-01-31 NOTE — Telephone Encounter (Signed)
1st follow up call made.  NALM 

## 2020-02-05 ENCOUNTER — Encounter: Payer: Self-pay | Admitting: Gastroenterology

## 2020-02-05 ENCOUNTER — Other Ambulatory Visit: Payer: Self-pay | Admitting: Family Medicine

## 2020-02-05 DIAGNOSIS — Z720 Tobacco use: Secondary | ICD-10-CM

## 2020-02-05 DIAGNOSIS — I10 Essential (primary) hypertension: Secondary | ICD-10-CM

## 2020-02-05 DIAGNOSIS — F5101 Primary insomnia: Secondary | ICD-10-CM

## 2020-02-18 ENCOUNTER — Encounter: Payer: Self-pay | Admitting: Family Medicine

## 2020-02-18 ENCOUNTER — Other Ambulatory Visit: Payer: Self-pay

## 2020-02-18 ENCOUNTER — Other Ambulatory Visit: Payer: Self-pay | Admitting: Family Medicine

## 2020-02-18 ENCOUNTER — Ambulatory Visit (INDEPENDENT_AMBULATORY_CARE_PROVIDER_SITE_OTHER): Payer: Medicaid Other | Admitting: Family Medicine

## 2020-02-18 VITALS — BP 128/72 | HR 81 | Wt 295.2 lb

## 2020-02-18 DIAGNOSIS — K635 Polyp of colon: Secondary | ICD-10-CM

## 2020-02-18 DIAGNOSIS — Z9989 Dependence on other enabling machines and devices: Secondary | ICD-10-CM | POA: Diagnosis not present

## 2020-02-18 DIAGNOSIS — Z794 Long term (current) use of insulin: Secondary | ICD-10-CM | POA: Diagnosis not present

## 2020-02-18 DIAGNOSIS — I10 Essential (primary) hypertension: Secondary | ICD-10-CM | POA: Diagnosis not present

## 2020-02-18 DIAGNOSIS — E785 Hyperlipidemia, unspecified: Secondary | ICD-10-CM

## 2020-02-18 DIAGNOSIS — E119 Type 2 diabetes mellitus without complications: Secondary | ICD-10-CM

## 2020-02-18 DIAGNOSIS — G4733 Obstructive sleep apnea (adult) (pediatric): Secondary | ICD-10-CM | POA: Diagnosis not present

## 2020-02-18 HISTORY — DX: Polyp of colon: K63.5

## 2020-02-18 LAB — POCT GLYCOSYLATED HEMOGLOBIN (HGB A1C): HbA1c, POC (controlled diabetic range): 7.5 % — AB (ref 0.0–7.0)

## 2020-02-18 MED ORDER — HYDROCORTISONE 2.5 % EX OINT: TOPICAL_OINTMENT | Freq: Two times a day (BID) | CUTANEOUS | 0 refills | Status: DC

## 2020-02-18 NOTE — Assessment & Plan Note (Signed)
A1c at goal On metformin, insulin, jardiance and liraglutide

## 2020-02-18 NOTE — Progress Notes (Signed)
    SUBJECTIVE:   CHIEF COMPLAINT / HPI:   HYPERTENSION  Disease Monitoring  Blood pressure range-not checking Chest pain- no      Dyspnea- no Medications  Compliance- remembers meds Lightheadedness- no   Edema- no      DIABETES  Disease Monitoring  Blood Sugar ranges-not checking Polyuria- no New Visual problems- no  Medications  Compliance- can say his doses Hypoglycemic symptoms- no lowest 90s     HYPERLIPIDEMIA  Disease Monitoring  See symptoms for Hypertension  Medications  Compliance- lipitor daily RUQ pain- no  Muscle aches- no     PERTINENT  PMH / PSH: hearing difficculty  OBJECTIVE:   BP 128/72   Pulse 81   Wt 295 lb 3.2 oz (133.9 kg)   SpO2 96%   BMI 49.12 kg/m   Heart - Regular rate and rhythm.  No murmurs, gallops or rubs.    Lungs:  Normal respiratory effort, chest expands symmetrically. Lungs are clear to auscultation, no crackles or wheezes. Obese - trace lower extremity edema   ASSESSMENT/PLAN:   Essential hypertension BP Readings from Last 3 Encounters:  02/18/20 128/72  01/29/20 (!) 129/58  01/29/20 130/64   At goal today continue medications   Hyperlipidemia with target LDL less than 70 Lab Results  Component Value Date   LDLCALC 32 05/09/2019   At goal   Insulin dependent type 2 diabetes mellitus ([place]) A1c at goal On metformin, insulin, jardiance and liraglutide      Lind Covert, MD [place]

## 2020-02-18 NOTE — Patient Instructions (Addendum)
Good to see you today!  Thanks for coming in.  Your diabetes is great  Your weight is up.  Try to eat smaller portions and increase your exercise  I will contact the sleep study referral   You should make an appointment with [facility] for a repeat colonscopy   Come back in 3 months  BRING ALL YOUR MEDICATION BOTTLES

## 2020-02-18 NOTE — Assessment & Plan Note (Signed)
Lab Results  Component Value Date   LDLCALC 32 05/09/2019   At goal

## 2020-02-18 NOTE — Assessment & Plan Note (Signed)
BP Readings from Last 3 Encounters:  02/18/20 128/72  01/29/20 (!) 129/58  01/29/20 130/64   At goal today continue medications

## 2020-02-21 ENCOUNTER — Telehealth: Payer: Self-pay

## 2020-02-21 ENCOUNTER — Other Ambulatory Visit: Payer: Self-pay | Admitting: Family Medicine

## 2020-02-21 DIAGNOSIS — I214 Non-ST elevation (NSTEMI) myocardial infarction: Secondary | ICD-10-CM

## 2020-02-21 DIAGNOSIS — Z794 Long term (current) use of insulin: Secondary | ICD-10-CM

## 2020-02-21 DIAGNOSIS — E1159 Type 2 diabetes mellitus with other circulatory complications: Secondary | ICD-10-CM

## 2020-02-21 DIAGNOSIS — I10 Essential (primary) hypertension: Secondary | ICD-10-CM

## 2020-02-21 MED ORDER — INSULIN ASPART 100 UNIT/ML ~~LOC~~ SOLN: 30.0000 [IU] | Freq: Two times a day (BID) | SUBCUTANEOUS | 5 refills | Status: DC

## 2020-02-21 MED ORDER — NITROGLYCERIN 0.4 MG SL SUBL: SUBLINGUAL_TABLET | SUBLINGUAL | 0 refills | Status: DC

## 2020-02-21 NOTE — Telephone Encounter (Signed)
[name] [name], from [place], calls to verify insulin rx. States that rx patient picked up this morning was for 50 units injected into skin, twice daily, before meals. However, new rx is for 30 units into the skin, twice daily, with meals. Verified with Dr. Erin Hearing that patient is to receive 30 units twice daily. Pharmacy informed.   Talbot Grumbling, RN

## 2020-03-06 ENCOUNTER — Other Ambulatory Visit: Payer: Self-pay | Admitting: Family Medicine

## 2020-03-06 DIAGNOSIS — E785 Hyperlipidemia, unspecified: Secondary | ICD-10-CM

## 2020-03-06 DIAGNOSIS — Z9861 Coronary angioplasty status: Secondary | ICD-10-CM

## 2020-03-15 ENCOUNTER — Other Ambulatory Visit: Payer: Self-pay | Admitting: Family Medicine

## 2020-03-15 DIAGNOSIS — F5101 Primary insomnia: Secondary | ICD-10-CM

## 2020-03-28 ENCOUNTER — Other Ambulatory Visit: Payer: Self-pay | Admitting: Physician Assistant

## 2020-03-28 DIAGNOSIS — I2510 Atherosclerotic heart disease of native coronary artery without angina pectoris: Secondary | ICD-10-CM

## 2020-03-30 ENCOUNTER — Other Ambulatory Visit (HOSPITAL_COMMUNITY): Payer: Medicaid Other

## 2020-03-30 NOTE — Telephone Encounter (Signed)
Rx(s) sent to pharmacy electronically.  

## 2020-03-31 ENCOUNTER — Ambulatory Visit (HOSPITAL_BASED_OUTPATIENT_CLINIC_OR_DEPARTMENT_OTHER): Payer: Medicaid Other | Attending: Family Medicine | Admitting: Internal Medicine

## 2020-03-31 ENCOUNTER — Other Ambulatory Visit: Payer: Self-pay

## 2020-03-31 VITALS — Ht 67.0 in | Wt 285.0 lb

## 2020-03-31 DIAGNOSIS — Z9989 Dependence on other enabling machines and devices: Secondary | ICD-10-CM | POA: Insufficient documentation

## 2020-03-31 DIAGNOSIS — G4733 Obstructive sleep apnea (adult) (pediatric): Secondary | ICD-10-CM | POA: Insufficient documentation

## 2020-04-01 ENCOUNTER — Telehealth: Payer: Self-pay | Admitting: Family Medicine

## 2020-04-01 ENCOUNTER — Encounter (HOSPITAL_BASED_OUTPATIENT_CLINIC_OR_DEPARTMENT_OTHER): Payer: Medicaid Other | Admitting: Internal Medicine

## 2020-04-01 DIAGNOSIS — G4733 Obstructive sleep apnea (adult) (pediatric): Secondary | ICD-10-CM

## 2020-04-01 NOTE — Telephone Encounter (Signed)
He had repeat sleep study. I will order new cpap equipment once results are back

## 2020-04-01 NOTE — Telephone Encounter (Signed)
Sleep apnea machine isn't working, needs doctor to order another one. He says he has been without it now for 17 days. Please call patient with questions. Thanks

## 2020-04-04 NOTE — Progress Notes (Signed)
04/08/2020 [name] [name]   1961/04/14  [id number]  Primary Physician Carney Living, MD Primary Cardiologist: [name]  HPI:  [name] [name] is seen today for follow up CAD. His past medical history is significant for CAD, DM, obesity, tobacco use , hyperlipidemia and hypertension. He is status post stenting of the right coronary artery in May of 2013. He was admitted with a NSTEMI inFebruary of 2015. He was found to have severe disease in LCx and OM2 treated with BMS.  He was placed on dual antiplatelet therapy with aspirin plus Effient.   He presented with increased angina in October 2015. A Myoview study was intermediate risk.  He had repeat cardiac cath in November 2015.  He was found to have severe single-vessel disease involving the bifurcation of OM1 and OM 2 with 99% in-stent restenosis in the bare-metal stent placed in the OM 2. He underwent successful PCI spanning the proximal circumflex stent across OM 1 through the bare-metal stent placed in OM 2 with a Xience alpine DES stent. He was also noted to have a widely patent stent in the RCA and proximal circumflex with otherwise mild-moderate disease. He has preserved LVEF with an ejection fraction of 55-65%. He was instructed to continue with DAPT with ASA + Effient.   On follow up today he reports he is doing OK. He denies any chest pain or SOB. He states he is back smoking off and on. He has gained weight. Notes his CPAP is broken and he is waiting for a new machine. Seen by Dr Maple Hudson recently. Still exercises 2-3 times a week at [employer]..   Current Outpatient Medications  Medication Sig Dispense Refill  . ACCU-CHEK AVIVA PLUS test strip USE ONE STRIP TO TEST FOUR TIMES A DAY 100 strip 10  . ACCU-CHEK FASTCLIX LANCETS MISC 1 Units by Percutaneous route 4 (four) times daily. 100 each 12  . albuterol (PROAIR HFA) 108 (90 Base) MCG/ACT inhaler Inhale 2 puffs into the lungs every 6 (six) hours as needed for wheezing or shortness of  breath. 18 g 2  . aspirin 81 MG chewable tablet Chew 1 tablet (81 mg total) by mouth daily. 180 tablet 2  . atorvastatin (LIPITOR) 80 MG tablet TAKE ONE TABLET BY MOUTH DAILY 90 tablet 1  . BD INSULIN SYRINGE U/F 31G X 5/16" 1 ML MISC USE UP TO FIVE TIMES A DAY WITH NOVOLOG AND LANTUS DOSING 100 each 1  . BD ULTRA-FINE PEN NEEDLES 29G X 12.7MM MISC USE TO WITH VICTOZA PEN, INJECTION ONCE DAILY. 100 each 9  . Blood Glucose Monitoring Suppl (ACCU-CHEK AVIVA PLUS) w/Device KIT CHECK BLOOD GLUCOSE FASTING AND 2 HOURS AFTER [facility] AND DINNER 1 kit 0  . buPROPion (WELLBUTRIN XL) 150 MG 24 hr tablet TAKE ONE TABLET BY MOUTH DAILY 30 tablet 0  . carvedilol (COREG) 3.125 MG tablet TAKE ONE TABLET BY MOUTH TWICE A DAY WITH MEALS 60 tablet 0  . ciprofloxacin-dexamethasone (CIPRODEX) OTIC suspension Place 4 drops into the left ear 2 (two) times daily.    . citalopram (CELEXA) 20 MG tablet Take 1 tablet (20 mg total) by mouth daily. 90 tablet 1  . hydrocortisone 2.5 % ointment Apply topically 2 (two) times daily. 30 g 0  . hydrOXYzine (VISTARIL) 25 MG capsule TAKE ONE CAPSULE BY MOUTH EVERY NIGHT AT BEDTIME 30 capsule 2  . insulin aspart (NOVOLOG) 100 UNIT/ML injection Inject 30 Units into the skin 2 (two) times daily with a meal. 30 mL 5  .  insulin glargine (LANTUS) 100 UNIT/ML injection Inject 0.45 mLs (45 Units total) into the skin 2 (two) times daily.    Marland Kitchen JARDIANCE 25 MG TABS tablet TAKE ONE TABLET BY MOUTH DAILY 90 tablet 3  . liraglutide (VICTOZA) 18 MG/3ML SOPN Inject 0.3 mLs (1.8 mg total) into the skin daily. INJECT 0.3 MLS (1.8 MG TOTAL) INTO THE SKIN DAILY 9 pen 3  . lisinopril (ZESTRIL) 2.5 MG tablet TAKE ONE TABLET BY MOUTH TWICE A DAY 180 tablet 1  . Melatonin 2.5 MG CAPS Take 5 mg by mouth at bedtime.    . metFORMIN (GLUCOPHAGE) 1000 MG tablet TAKE ONE TABLET BY MOUTH TWICE A DAY WITH MEALS 180 tablet 9  . mometasone-formoterol (DULERA) 200-5 MCG/ACT AERO INHALE TWO PUFFS BY MOUTH  TWICE A DAY 39 g 2  . Multiple Vitamin (MULTIVITAMIN) tablet Take 1 tablet by mouth daily.    . nitroGLYCERIN (NITROSTAT) 0.4 MG SL tablet DISSOLVE 1 TAB UNDER TONGUE FOR CHEST PAIN - IF PAIN REMAINS AFTER 5 MIN, CALL 911 AND REPEAT DOSE. MAX 3 TABS IN 15 MINUTES 100 tablet 0  . nitroGLYCERIN (NITROSTAT) 0.4 MG SL tablet Use one under tongue for chest pain as needed 100 tablet 0  . Omega-3 Fatty Acids (FISH OIL) 1000 MG CAPS Take 1 capsule (1,000 mg total) by mouth daily.    . prasugrel (EFFIENT) 10 MG TABS tablet TAKE ONE TABLET BY MOUTH DAILY 90 tablet 1  . traMADol (ULTRAM) 50 MG tablet TAKE ONE TABLET BY MOUTH EVERY 6 HOURS AS NEEDED 20 tablet 0   No current facility-administered medications for this visit.    Allergies  Allergen Reactions  . Shellfish Allergy Nausea And Vomiting    OYSTERS    Social History   Socioeconomic History  . Marital status: Single    Spouse name: Not on file  . Number of children: 0  . Years of education: 68  . Highest education level: Not on file  Occupational History  . Occupation: Unemployed    Comment: Used to work for [employer]  . Occupation: Consulting civil engineer, Museum/gallery curator  Tobacco Use  . Smoking status: Former Smoker    Packs/day: 1.00    Years: 42.00    Pack years: 42.00    Types: Cigarettes    Start date: 09/13/1975    Quit date: 01/25/2018    Years since quitting: 2.2  . Smokeless tobacco: Current User    Types: Chew  . Tobacco comment: Bupropion helping  Vaping Use  . Vaping Use: Never used  Substance and Sexual Activity  . Alcohol use: Yes    Alcohol/week: 6.0 standard drinks    Types: 6 Cans of beer per week    Comment: pt reports "I quit 66 days ago" (05/09/16)  . Drug use: No    Comment: Crack Cocaine (denies 05/09/16)  . Sexual activity: Not Currently  Other Topics Concern  . Not on file  Social History Narrative   Single.  Lives with a roommate.  Ambulates independently.   Social Determinants of Health    Financial Resource Strain:   . Difficulty of Paying Living Expenses:   Food Insecurity:   . Worried About Programme researcher, broadcasting/film/video in the Last Year:   . Barista in the Last Year:   Transportation Needs:   . Freight forwarder (Medical):   Marland Kitchen Lack of Transportation (Non-Medical):   Physical Activity:   . Days of Exercise per Week:   .  Minutes of Exercise per Session:   Stress:   . Feeling of Stress :   Social Connections:   . Frequency of Communication with Friends and Family:   . Frequency of Social Gatherings with Friends and Family:   . Attends Religious Services:   . Active Member of Clubs or Organizations:   . Attends Banker Meetings:   Marland Kitchen Marital Status:   Intimate Partner Violence:   . Fear of Current or Ex-Partner:   . Emotionally Abused:   Marland Kitchen Physically Abused:   . Sexually Abused:      Review of Systems: AS noted in HPI All other systems reviewed and are otherwise negative except as noted above.  Blood pressure 128/68, pulse 84, temperature (!) 96.9 F (36.1 C), height 5\' 7"  (1.702 m), weight (!) 300 lb (136.1 kg), SpO2 94 %.  GENERAL:  Well appearing obese WM in NAD HEENT:  PERRL, EOMI, sclera are clear. Oropharynx is clear. NECK:  No jugular venous distention, carotid upstroke brisk and symmetric, no bruits, no thyromegaly or adenopathy LUNGS:  Clear to auscultation bilaterally CHEST:  Unremarkable HEART:  RRR,  PMI not displaced or sustained,S1 and S2 within normal limits, no S3, no S4: no clicks, no rubs, no murmurs ABD:  Soft, nontender. BS +, no masses or bruits. No hepatomegaly, no splenomegaly EXT:  2 + pulses throughout, no edema, no cyanosis no clubbing SKIN:  Warm and dry.  No rashes NEURO:  Alert and oriented x 3. Cranial nerves II through XII intact. PSYCH:  Cognitively intact      Laboratory data:  Lab Results  Component Value Date   WBC 9.8 01/29/2020   HGB 15.2 01/29/2020   HCT 46.5 01/29/2020   PLT 218 01/29/2020    GLUCOSE 260 (H) 01/29/2020   CHOL 94 (L) 05/09/2019   TRIG 143 05/09/2019   HDL 33 (L) 05/09/2019   LDLCALC 32 05/09/2019   ALT 16 05/09/2019   AST 17 05/09/2019   NA 138 01/29/2020   K 4.2 01/29/2020   CL 104 01/29/2020   CREATININE 0.72 01/29/2020   BUN 11 01/29/2020   CO2 26 01/29/2020   TSH 1.100 01/18/2012   INR 0.96 07/28/2014   HGBA1C 7.5 (A) 02/18/2020   MICROALBUR 4.7 05/23/2016      ASSESSMENT AND PLAN:   1. CAD: Status post repeat PCI to the proximal circumflex and OM 2 in Nov. 2015 as outlined above with DES for instent restenosis. He is asymptomatic.  Continue dual antiplatelet therapy with aspirin plus Effient indefinitely due to extensive nature of stents.  Continue beta blocker, statin and ACE inhibitor for secondary prevention.  2. Hypertension: Blood pressure is well-controlled. Continue lisinopril and Coreg  3. Hyperlipidemia: on statin. Lipids in August were excellent.   4. Diabetes: per primary care. Encourage weight loss and continued physical exercise.  5. Tobacco abuse: tried to reinforce complete cessation.  I will follow up in 6 months   Sedric Guia [place]il[place] MD,FACC  04/08/2020 4:18 PM

## 2020-04-05 DIAGNOSIS — G4733 Obstructive sleep apnea (adult) (pediatric): Secondary | ICD-10-CM

## 2020-04-05 DIAGNOSIS — Z9989 Dependence on other enabling machines and devices: Secondary | ICD-10-CM

## 2020-04-05 NOTE — Procedures (Signed)
   Patient Name: [name] [name], [name] [name] Date: 03/31/2020 Gender: Male D.O.B: 1960-11-14 Age (years): 58 Referring Provider: Talbert Cage Height (inches): 33 Interpreting Physician: Baird Lyons MD, ABSM Weight (lbs): 285 RPSGT: Carolin Coy BMI: 45 MRN: [id number] Neck Size: 22.50  CLINICAL INFORMATION The patient is referred for a BiPAP titration to treat sleep apnea.  Date of NPSG, Split Night or HST:  NPSG 08/19/2019  AHI 110.6/ hr, desaturation to 80%, AHI 290 lbs  SLEEP STUDY TECHNIQUE As per the AASM Manual for the Scoring of Sleep and Associated Events v2.3 (April 2016) with a hypopnea requiring 4% desaturations.  The channels recorded and monitored were frontal, central and occipital EEG, electrooculogram (EOG), submentalis EMG (chin), nasal and oral airflow, thoracic and abdominal wall motion, anterior tibialis EMG, snore microphone, electrocardiogram, and pulse oximetry. Bilevel positive airway pressure (BPAP) was initiated at the beginning of the study and titrated to treat sleep-disordered breathing.  MEDICATIONS Medications self-administered by patient taken the night of the study : none reported  RESPIRATORY PARAMETERS Optimal IPAP Pressure (cm): 25 AHI at Optimal Pressure (/hr) 0.0 Optimal EPAP Pressure (cm): 21   Overall Minimal O2 (%): 75.0 Minimal O2 at Optimal Pressure (%): 89.0 SLEEP ARCHITECTURE Start Time: 10:54:13 PM Stop Time: 5:10:10 AM Total Time (min): 375.9 Total Sleep Time (min): 295 Sleep Latency (min): 8.9 Sleep Efficiency (%): 78.5% REM Latency (min): 117.5 WASO (min): 72.0 Stage N1 (%): 18.0% Stage N2 (%): 41.7% Stage N3 (%): 0.0% Stage R (%): 40.3 Supine (%): 66.52 Arousal Index (/hr): 30.9   CARDIAC DATA The 2 lead EKG demonstrated sinus rhythm. The mean heart rate was 75.6 beats per minute. Other EKG findings include: None.  LEG MOVEMENT DATA The total Periodic Limb Movements of Sleep (PLMS) were 0. The PLMS index was 0.0. A PLMS index  of <15 is considered normal in adults.  IMPRESSIONS - CPAP did not provide adequate control at tolerated pressures and was changed to BIPAP titration. - An optimal BiPAP pressure was selected for this patient ( 25 / 21 cm of water) - Central sleep apnea was not noted during this titration (CAI = 0.4/h). - Severe oxygen desaturations were observed during this titration (min O2 = 75.0%). - The patient snored with soft snoring volume. - No cardiac abnormalities were observed during this study. - Clinically significant periodic limb movements were not noted during this study. Arousals associated with PLMs were rare.  DIAGNOSIS - Obstructive Sleep Apnea (G47.33)  RECOMMENDATIONS - Trial of BiPAP therapy on 25/21 cm H2O with a Large size Resmed Full Face Mask AirFit F10 mask and heated humidification. - Be careful with alcohol, sedatives and other CNS depressants that may worsen sleep apnea and disrupt normal sleep architecture. - Sleep hygiene should be reviewed to assess factors that may improve sleep quality. - Weight management and regular exercise should be initiated or continued.  [Electronically signed] 04/05/2020 10:32 AM  Baird Lyons MD, ABSM Diplomate, American Board of Sleep Medicine   NPI: [id number]                          [place] [place] [facility]  ELECTRONICALLY SIGNED ON:  04/05/2020, 10:33 AM [place] PH: [phone]   FX: [phone] [place]

## 2020-04-06 NOTE — Telephone Encounter (Signed)
DME order placed as per sleep study recommendations

## 2020-04-07 ENCOUNTER — Other Ambulatory Visit: Payer: Self-pay | Admitting: Family Medicine

## 2020-04-07 ENCOUNTER — Other Ambulatory Visit: Payer: Self-pay

## 2020-04-07 DIAGNOSIS — F5101 Primary insomnia: Secondary | ICD-10-CM

## 2020-04-07 DIAGNOSIS — J449 Chronic obstructive pulmonary disease, unspecified: Secondary | ICD-10-CM

## 2020-04-07 MED ORDER — ALBUTEROL SULFATE HFA 108 (90 BASE) MCG/ACT IN AERS: INHALATION_SPRAY | RESPIRATORY_TRACT | 4 refills | Status: DC

## 2020-04-08 ENCOUNTER — Encounter: Payer: Self-pay | Admitting: Cardiology

## 2020-04-08 ENCOUNTER — Ambulatory Visit: Payer: Medicaid Other | Admitting: Cardiology

## 2020-04-08 ENCOUNTER — Other Ambulatory Visit: Payer: Self-pay | Admitting: Family Medicine

## 2020-04-08 ENCOUNTER — Telehealth: Payer: Self-pay

## 2020-04-08 ENCOUNTER — Other Ambulatory Visit: Payer: Self-pay

## 2020-04-08 VITALS — BP 128/68 | HR 84 | Temp 96.9°F | Ht 67.0 in | Wt 300.0 lb

## 2020-04-08 DIAGNOSIS — Z720 Tobacco use: Secondary | ICD-10-CM

## 2020-04-08 DIAGNOSIS — G4733 Obstructive sleep apnea (adult) (pediatric): Secondary | ICD-10-CM

## 2020-04-08 DIAGNOSIS — E119 Type 2 diabetes mellitus without complications: Secondary | ICD-10-CM

## 2020-04-08 DIAGNOSIS — E782 Mixed hyperlipidemia: Secondary | ICD-10-CM | POA: Diagnosis not present

## 2020-04-08 DIAGNOSIS — I10 Essential (primary) hypertension: Secondary | ICD-10-CM | POA: Diagnosis not present

## 2020-04-08 DIAGNOSIS — I2510 Atherosclerotic heart disease of native coronary artery without angina pectoris: Secondary | ICD-10-CM | POA: Diagnosis not present

## 2020-04-08 DIAGNOSIS — Z9989 Dependence on other enabling machines and devices: Secondary | ICD-10-CM

## 2020-04-08 MED ORDER — ALBUTEROL SULFATE HFA 108 (90 BASE) MCG/ACT IN AERS: 2.0000 | INHALATION_SPRAY | Freq: Four times a day (QID) | RESPIRATORY_TRACT | 2 refills | Status: DC | PRN

## 2020-04-08 NOTE — Telephone Encounter (Signed)
See below. Medicaid only pays for ProAir.

## 2020-04-13 ENCOUNTER — Other Ambulatory Visit: Payer: Self-pay

## 2020-04-13 ENCOUNTER — Encounter: Payer: Self-pay | Admitting: Podiatry

## 2020-04-13 ENCOUNTER — Ambulatory Visit: Payer: Medicaid Other | Admitting: Podiatry

## 2020-04-13 DIAGNOSIS — Z794 Long term (current) use of insulin: Secondary | ICD-10-CM

## 2020-04-13 DIAGNOSIS — M79674 Pain in right toe(s): Secondary | ICD-10-CM

## 2020-04-13 DIAGNOSIS — M79675 Pain in left toe(s): Secondary | ICD-10-CM | POA: Diagnosis not present

## 2020-04-13 DIAGNOSIS — B351 Tinea unguium: Secondary | ICD-10-CM

## 2020-04-13 DIAGNOSIS — E119 Type 2 diabetes mellitus without complications: Secondary | ICD-10-CM

## 2020-04-13 DIAGNOSIS — Z720 Tobacco use: Secondary | ICD-10-CM

## 2020-04-13 MED ORDER — BUPROPION HCL ER (XL) 150 MG PO TB24: 150.0000 mg | ORAL_TABLET | Freq: Every day | ORAL | 1 refills | Status: DC

## 2020-04-14 ENCOUNTER — Other Ambulatory Visit: Payer: Self-pay | Admitting: Family Medicine

## 2020-04-14 DIAGNOSIS — M545 Low back pain, unspecified: Secondary | ICD-10-CM

## 2020-04-14 DIAGNOSIS — G8929 Other chronic pain: Secondary | ICD-10-CM

## 2020-04-14 NOTE — Progress Notes (Signed)
Subjective: [name] [name] is a 59 y.o. male patient seen today preventative diabetic foot care and painful mycotic nails b/l that are difficult to trim. Pain interferes with ambulation. Aggravating factors include wearing enclosed shoe gear. Pain is relieved with periodic professional debridement.  He voices no new pedal problems on today's visit.  Patient Active Problem List   Diagnosis Date Noted  . Colon polyps 02/18/2020  . Antiplatelet or antithrombotic long-term use 12/23/2019  . Mild nonproliferative diabetic retinopathy of both eyes without macular edema associated with type 2 diabetes mellitus ([place]) 04/30/2019  . Depression, acute 02/17/2016  . Hearing difficulty 12/10/2014  . COPD, moderate ([place]) 11/22/2014  . Angina, class II ([place]) 08/01/2014  . H/O NSTEMI (non-ST elevated myocardial infarction) 11/07/2013  . Pre-ulcerative corn or callous 10/12/2012  . History of tobacco use 08/24/2012  . Obesity 07/31/2012  . Insulin dependent type 2 diabetes mellitus ([place]) 06/05/2012  . CAD S/P percutaneous coronary angioplasty 01/21/2012  . OSA on CPAP 01/19/2012  . Essential hypertension 01/18/2012  . Hyperlipidemia with target LDL less than 70 01/18/2012    Current Outpatient Medications on File Prior to Visit  Medication Sig Dispense Refill  . ACCU-CHEK AVIVA PLUS test strip USE ONE STRIP TO TEST FOUR TIMES A DAY 100 strip 10  . ACCU-CHEK FASTCLIX LANCETS MISC 1 Units by Percutaneous route 4 (four) times daily. 100 each 12  . albuterol (PROAIR HFA) 108 (90 Base) MCG/ACT inhaler Inhale 2 puffs into the lungs every 6 (six) hours as needed for wheezing or shortness of breath. 18 g 2  . aspirin 81 MG chewable tablet Chew 1 tablet (81 mg total) by mouth daily. 180 tablet 2  . atorvastatin (LIPITOR) 80 MG tablet TAKE ONE TABLET BY MOUTH DAILY 90 tablet 1  . BD INSULIN SYRINGE U/F 31G X 5/16" 1 ML MISC USE UP TO FIVE TIMES A DAY WITH NOVOLOG AND LANTUS DOSING 100 each 1  . BD ULTRA-FINE PEN  NEEDLES 29G X 12.7MM MISC USE TO WITH VICTOZA PEN, INJECTION ONCE DAILY. 100 each 9  . Blood Glucose Monitoring Suppl (ACCU-CHEK AVIVA PLUS) w/Device KIT CHECK BLOOD GLUCOSE FASTING AND 2 HOURS AFTER [facility] AND DINNER 1 kit 0  . carvedilol (COREG) 3.125 MG tablet TAKE ONE TABLET BY MOUTH TWICE A DAY WITH MEALS 60 tablet 0  . ciprofloxacin-dexamethasone (CIPRODEX) OTIC suspension Place 4 drops into the left ear 2 (two) times daily.    . citalopram (CELEXA) 20 MG tablet Take 1 tablet (20 mg total) by mouth daily. 90 tablet 1  . hydrocortisone 2.5 % ointment Apply topically 2 (two) times daily. 30 g 0  . hydrOXYzine (VISTARIL) 25 MG capsule TAKE ONE CAPSULE BY MOUTH EVERY NIGHT AT BEDTIME 30 capsule 2  . insulin aspart (NOVOLOG) 100 UNIT/ML injection Inject 30 Units into the skin 2 (two) times daily with a meal. 30 mL 5  . insulin glargine (LANTUS) 100 UNIT/ML injection Inject 0.45 mLs (45 Units total) into the skin 2 (two) times daily.    Marland Kitchen JARDIANCE 25 MG TABS tablet TAKE ONE TABLET BY MOUTH DAILY 90 tablet 3  . liraglutide (VICTOZA) 18 MG/3ML SOPN Inject 0.3 mLs (1.8 mg total) into the skin daily. INJECT 0.3 MLS (1.8 MG TOTAL) INTO THE SKIN DAILY 9 pen 3  . lisinopril (ZESTRIL) 2.5 MG tablet TAKE ONE TABLET BY MOUTH TWICE A DAY 180 tablet 1  . Melatonin 2.5 MG CAPS Take 5 mg by mouth at bedtime.    . metFORMIN (GLUCOPHAGE)  1000 MG tablet TAKE ONE TABLET BY MOUTH TWICE A DAY WITH MEALS 180 tablet 9  . mometasone-formoterol (DULERA) 200-5 MCG/ACT AERO INHALE TWO PUFFS BY MOUTH TWICE A DAY 39 g 2  . Multiple Vitamin (MULTIVITAMIN) tablet Take 1 tablet by mouth daily.    . nitroGLYCERIN (NITROSTAT) 0.4 MG SL tablet DISSOLVE 1 TAB UNDER TONGUE FOR CHEST PAIN - IF PAIN REMAINS AFTER 5 MIN, CALL 911 AND REPEAT DOSE. MAX 3 TABS IN 15 MINUTES 100 tablet 0  . nitroGLYCERIN (NITROSTAT) 0.4 MG SL tablet Use one under tongue for chest pain as needed 100 tablet 0  . Omega-3 Fatty Acids (FISH OIL) 1000  MG CAPS Take 1 capsule (1,000 mg total) by mouth daily.    . prasugrel (EFFIENT) 10 MG TABS tablet TAKE ONE TABLET BY MOUTH DAILY 90 tablet 1  . traMADol (ULTRAM) 50 MG tablet TAKE ONE TABLET BY MOUTH EVERY 6 HOURS AS NEEDED 20 tablet 0  . [DISCONTINUED] Blood Glucose Monitoring Suppl (ACCU-CHEK AVIVA PLUS) w/Device KIT CHECK BLOOD GLUCOSE FASTING AND 2 HOURS AFTER BREAKFAST, LUNCH AND DINNER 1 kit 0   No current facility-administered medications on file prior to visit.    Allergies  Allergen Reactions  . Shellfish Allergy Nausea And Vomiting    OYSTERS    Objective: Physical Exam  General: Well developed, nourished, no acute distress, awake, alert and oriented x 3  Vascular:  Capillary refill time to digits immediate b/l. Palpable DP pulses b/l. Palpable PT pulses b/l. Pedal hair sparse b/l. Skin temperature gradient within normal limits b/l.  Dermatological:  Pedal skin with normal turgor, texture and tone bilaterally. No open wounds bilaterally. No interdigital macerations bilaterally. Toenails 1-5 b/l elongated, dystrophic, thickened, crumbly with subungual debris and tenderness to dorsal palpation.  Musculoskeletal:  Normal muscle strength 5/5 to all lower extremity muscle groups bilaterally. No gross bony deformities bilaterally. No pain crepitus or joint limitation noted with ROM b/l.  Neurological:  Protective sensation diminished with 10g monofilament b/l. Vibratory sensation diminished b/l.  Assessment and Plan:  Problem List Items Addressed This Visit    None    Visit Diagnoses    Pain due to onychomycosis of toenails of both feet    -  Primary   Type 2 diabetes mellitus without complication, with long-term current use of insulin ([place])         -Examined patient.  -No new findings. No new orders. -Discussed treatment options for painful mycotic nails. -Mechanically debrided and reduced mycotic nails with sterile nail nipper and dremel nail file without  incident. -Continue diabetic foot care principles. -Patient to report any pedal injuries to medical professional immediately. -Patient to continue soft, supportive shoe gear daily. -Patient/POA to call should there be question/concern in the interim.  Return in about 3 months (around 07/14/2020) for diabetic nail trim.  Marzetta Board, DPM

## 2020-04-21 ENCOUNTER — Other Ambulatory Visit: Payer: Self-pay | Admitting: Family Medicine

## 2020-04-21 ENCOUNTER — Other Ambulatory Visit: Payer: Self-pay | Admitting: *Deleted

## 2020-04-21 DIAGNOSIS — G8929 Other chronic pain: Secondary | ICD-10-CM

## 2020-04-21 DIAGNOSIS — M545 Low back pain, unspecified: Secondary | ICD-10-CM

## 2020-04-21 MED ORDER — TRAMADOL HCL 50 MG PO TABS: 50.0000 mg | ORAL_TABLET | Freq: Four times a day (QID) | ORAL | 0 refills | Status: DC | PRN

## 2020-04-25 ENCOUNTER — Other Ambulatory Visit: Payer: Self-pay | Admitting: Family Medicine

## 2020-04-25 DIAGNOSIS — I10 Essential (primary) hypertension: Secondary | ICD-10-CM

## 2020-04-25 DIAGNOSIS — I214 Non-ST elevation (NSTEMI) myocardial infarction: Secondary | ICD-10-CM

## 2020-04-30 ENCOUNTER — Other Ambulatory Visit: Payer: Self-pay | Admitting: *Deleted

## 2020-04-30 DIAGNOSIS — I10 Essential (primary) hypertension: Secondary | ICD-10-CM

## 2020-04-30 DIAGNOSIS — E1159 Type 2 diabetes mellitus with other circulatory complications: Secondary | ICD-10-CM

## 2020-04-30 DIAGNOSIS — Z794 Long term (current) use of insulin: Secondary | ICD-10-CM

## 2020-04-30 DIAGNOSIS — I214 Non-ST elevation (NSTEMI) myocardial infarction: Secondary | ICD-10-CM

## 2020-04-30 MED ORDER — INSULIN GLARGINE 100 UNIT/ML ~~LOC~~ SOLN: 45.0000 [IU] | Freq: Two times a day (BID) | SUBCUTANEOUS | 11 refills | Status: DC

## 2020-04-30 MED ORDER — NITROGLYCERIN 0.4 MG SL SUBL: SUBLINGUAL_TABLET | SUBLINGUAL | 0 refills | Status: DC

## 2020-04-30 NOTE — Telephone Encounter (Signed)
Received phone call from patient that was very concerned that he had not received lantus refill. Upon chart review, lantus was set to "no print", resent rx electronically

## 2020-04-30 NOTE — Addendum Note (Signed)
Addended by: Talbot Grumbling on: 04/30/2020 04:47 PM   Modules accepted: Orders

## 2020-05-04 ENCOUNTER — Other Ambulatory Visit: Payer: Self-pay | Admitting: *Deleted

## 2020-05-04 DIAGNOSIS — E1159 Type 2 diabetes mellitus with other circulatory complications: Secondary | ICD-10-CM

## 2020-05-05 MED ORDER — INSULIN GLARGINE 100 UNIT/ML ~~LOC~~ SOLN: 45.0000 [IU] | Freq: Two times a day (BID) | SUBCUTANEOUS | 1 refills | Status: DC

## 2020-05-07 ENCOUNTER — Other Ambulatory Visit: Payer: Self-pay | Admitting: Family Medicine

## 2020-05-07 DIAGNOSIS — Z9861 Coronary angioplasty status: Secondary | ICD-10-CM

## 2020-05-07 DIAGNOSIS — I10 Essential (primary) hypertension: Secondary | ICD-10-CM

## 2020-05-11 ENCOUNTER — Other Ambulatory Visit: Payer: Self-pay | Admitting: Family Medicine

## 2020-05-11 DIAGNOSIS — M545 Low back pain, unspecified: Secondary | ICD-10-CM

## 2020-05-12 NOTE — Telephone Encounter (Signed)
Covering for Dr. Erin Hearing. Patient just had this filled earlier in the month, last fill prior to that was in April Will defer to PCP who will be back in a few days  Leeanne Rio, MD

## 2020-05-19 ENCOUNTER — Other Ambulatory Visit: Payer: Self-pay

## 2020-05-19 ENCOUNTER — Encounter: Payer: Self-pay | Admitting: Family Medicine

## 2020-05-19 ENCOUNTER — Ambulatory Visit (INDEPENDENT_AMBULATORY_CARE_PROVIDER_SITE_OTHER): Payer: Medicaid Other | Admitting: Family Medicine

## 2020-05-19 VITALS — BP 124/72 | HR 80 | Wt 301.6 lb

## 2020-05-19 DIAGNOSIS — E119 Type 2 diabetes mellitus without complications: Secondary | ICD-10-CM | POA: Diagnosis not present

## 2020-05-19 DIAGNOSIS — I2510 Atherosclerotic heart disease of native coronary artery without angina pectoris: Secondary | ICD-10-CM

## 2020-05-19 DIAGNOSIS — M545 Low back pain: Secondary | ICD-10-CM

## 2020-05-19 DIAGNOSIS — Z23 Encounter for immunization: Secondary | ICD-10-CM

## 2020-05-19 DIAGNOSIS — Z794 Long term (current) use of insulin: Secondary | ICD-10-CM | POA: Diagnosis not present

## 2020-05-19 DIAGNOSIS — Z9861 Coronary angioplasty status: Secondary | ICD-10-CM | POA: Diagnosis not present

## 2020-05-19 DIAGNOSIS — G8929 Other chronic pain: Secondary | ICD-10-CM

## 2020-05-19 DIAGNOSIS — Z720 Tobacco use: Secondary | ICD-10-CM

## 2020-05-19 DIAGNOSIS — I10 Essential (primary) hypertension: Secondary | ICD-10-CM | POA: Diagnosis not present

## 2020-05-19 DIAGNOSIS — Z6841 Body Mass Index (BMI) 40.0 and over, adult: Secondary | ICD-10-CM | POA: Diagnosis not present

## 2020-05-19 DIAGNOSIS — I214 Non-ST elevation (NSTEMI) myocardial infarction: Secondary | ICD-10-CM | POA: Diagnosis not present

## 2020-05-19 LAB — POCT GLYCOSYLATED HEMOGLOBIN (HGB A1C): HbA1c, POC (controlled diabetic range): 7.5 % — AB (ref 0.0–7.0)

## 2020-05-19 MED ORDER — CARVEDILOL 3.125 MG PO TABS: 3.1250 mg | ORAL_TABLET | Freq: Two times a day (BID) | ORAL | 3 refills | Status: DC

## 2020-05-19 MED ORDER — NITROGLYCERIN 0.4 MG SL SUBL: SUBLINGUAL_TABLET | SUBLINGUAL | 0 refills | Status: DC

## 2020-05-19 NOTE — Assessment & Plan Note (Signed)
Worsening.  See after visit summary for plans for exercise and diet changes

## 2020-05-19 NOTE — Progress Notes (Signed)
    SUBJECTIVE:   CHIEF COMPLAINT / HPI:   DIABETES Disease Monitoring: Blood Sugar range-no low blood sugars   Medications Adherence knows his medications and brings in his bottles.  Apparently ran out of Novolog and substituted lantus Hypoglycemic symptoms- no    TOBACCO USE Smokes irregularly several cigs per week Reasons they smoke - feels like he needs one When do they smoke varied times sometimes buys them sometimes friends give them to him Ideas about stopping: agrees with not buying   BACK PAIN Intermittently for years.  Takes tramadol every few months.  No weakness or incontinence.  Tries tylenol first  PERTINENT  PMH / PSH: hard of hearing. History of CAD  OBJECTIVE:   BP 124/72   Pulse 80   Wt (!) 301 lb 9.6 oz (136.8 kg)   SpO2 94%   BMI 47.24 kg/m   Heart - Regular rate and rhythm.  No murmurs, gallops or rubs.    Lungs:  Normal respiratory effort, chest expands symmetrically. Lungs are clear to auscultation, no crackles or wheezes. Extremities:  No cyanosis, edema, or deformity noted with good range of motion of all major joints.     ASSESSMENT/PLAN:   Insulin dependent type 2 diabetes mellitus (HCC) Well controlled on current slightly unusual regimen.  Continue   Tobacco abuse Smoking several cigs a day to a week.  See after visit summary for discussion   Back pain Intermittent treated with tramadol for years.  No red flags.  Should only need refills sparingly.     Obesity Worsening.  See after visit summary for plans for exercise and diet changes      Lind Covert, [place]

## 2020-05-19 NOTE — Assessment & Plan Note (Signed)
Smoking several cigs a day to a week.  See after visit summary for discussion

## 2020-05-19 NOTE — Progress Notes (Signed)
1 

## 2020-05-19 NOTE — Patient Instructions (Addendum)
Good to see you today!  Thanks for coming in.  For the weight  Increase exercise to 2 miles a day - every day  Cut back some on your food - try smaller sandwiches  Continue to work on stop smoking - Don't buy any   Come back in 3 months for a diabetes check

## 2020-05-19 NOTE — Assessment & Plan Note (Signed)
Intermittent treated with tramadol for years.  No red flags.  Should only need refills sparingly.

## 2020-05-19 NOTE — Assessment & Plan Note (Signed)
Well controlled on current slightly unusual regimen.  Continue

## 2020-06-22 ENCOUNTER — Other Ambulatory Visit: Payer: Self-pay | Admitting: Family Medicine

## 2020-07-14 ENCOUNTER — Ambulatory Visit: Payer: Medicaid Other | Admitting: Podiatry

## 2020-07-14 ENCOUNTER — Encounter: Payer: Self-pay | Admitting: Podiatry

## 2020-07-14 ENCOUNTER — Other Ambulatory Visit: Payer: Self-pay

## 2020-07-14 DIAGNOSIS — B351 Tinea unguium: Secondary | ICD-10-CM | POA: Diagnosis not present

## 2020-07-14 DIAGNOSIS — M79675 Pain in left toe(s): Secondary | ICD-10-CM

## 2020-07-14 DIAGNOSIS — M79674 Pain in right toe(s): Secondary | ICD-10-CM

## 2020-07-14 DIAGNOSIS — M25571 Pain in right ankle and joints of right foot: Secondary | ICD-10-CM

## 2020-07-14 DIAGNOSIS — E119 Type 2 diabetes mellitus without complications: Secondary | ICD-10-CM

## 2020-07-14 DIAGNOSIS — Z794 Long term (current) use of insulin: Secondary | ICD-10-CM

## 2020-07-18 NOTE — Progress Notes (Signed)
Subjective: [name] [name] is a 59 y.o. male patient seen today preventative diabetic foot care and painful mycotic nails b/l that are difficult to trim. Pain interferes with ambulation. Aggravating factors include wearing enclosed shoe gear. Pain is relieved with periodic professional debridement.  Today, he states he has had right ankle pain for the past 2 weeks. Denies any episodes of trauma. He has been working out at the gym 3-5 times per week walking 1-4-2 miles on the treadmill.  He voices no new pedal problems on today's visit.  Patient Active Problem List   Diagnosis Date Noted  . Colon polyps 02/18/2020  . Antiplatelet or antithrombotic long-term use 12/23/2019  . 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 Depression, acute 02/17/2016  . Hearing difficulty 12/10/2014  . COPD, moderate ([place]) 11/22/2014  . Angina, class II ([place]) 08/01/2014  . H/O NSTEMI (non-ST elevated myocardial infarction) 11/07/2013  . Pre-ulcerative corn or callous 10/12/2012  . Back pain 08/24/2012  . Tobacco abuse 08/24/2012  . Obesity 07/31/2012  . Insulin dependent type 2 diabetes mellitus ([place]) 06/05/2012  . CAD S/P percutaneous coronary angioplasty 01/21/2012  . OSA on CPAP 01/19/2012  . Essential hypertension 01/18/2012  . Hyperlipidemia with target LDL less than 70 01/18/2012    Current Outpatient Medications on File Prior to Visit  Medication Sig Dispense Refill  . ACCU-CHEK AVIVA PLUS test strip USE ONE STRIP TO TEST FOUR TIMES A DAY 100 strip 10  . ACCU-CHEK FASTCLIX LANCETS MISC 1 Units by Percutaneous route 4 (four) times daily. 100 each 12  . albuterol (PROAIR HFA) 108 (90 Base) MCG/ACT inhaler Inhale 2 puffs into the lungs every 6 (six) hours as needed for wheezing or shortness of breath. 18 g 2  . aspirin 81 MG chewable tablet Chew 1 tablet (81 mg total) by mouth daily. 180 tablet 2  .  atorvastatin (LIPITOR) 80 MG tablet TAKE ONE TABLET BY MOUTH DAILY 90 tablet 1  . BD INSULIN SYRINGE U/F 31G X 5/16" 1 ML MISC USE UP TO FIVE TIMES A DAY WITH NOVOLOG AND LANTUS DOSING 100 each 1  . BD ULTRA-FINE PEN NEEDLES 29G X 12.7MM MISC USE TO WITH VICTOZA PEN, INJECTION ONCE DAILY. 100 each 9  . Blood Glucose Monitoring Suppl (ACCU-CHEK AVIVA PLUS) w/Device KIT CHECK BLOOD GLUCOSE FASTING AND 2 HOURS AFTER [facility] AND DINNER 1 kit 0  . buPROPion (WELLBUTRIN XL) 150 MG 24 hr tablet Take 1 tablet (150 mg total) by mouth daily. 90 tablet 1  . carvedilol (COREG) 3.125 MG tablet Take 1 tablet (3.125 mg total) by mouth 2 (two) times daily with a meal. 180 tablet 3  . ciprofloxacin-dexamethasone (CIPRODEX) OTIC suspension Place 4 drops into the left ear 2 (two) times daily.    . citalopram (CELEXA) 20 MG tablet Take 1 tablet (20 mg total) by mouth daily. 90 tablet 1  . hydrocortisone 2.5 % ointment Apply topically 2 (two) times daily. 30 g 0  . hydrOXYzine (VISTARIL) 25 MG capsule TAKE ONE CAPSULE BY MOUTH EVERY NIGHT AT BEDTIME 30 capsule 2  . insulin aspart (NOVOLOG) 100 UNIT/ML injection Inject 30 Units into the skin 2 (two) times daily with a meal. 30 mL 5  . insulin glargine (LANTUS) 100 UNIT/ML injection Inject 0.45 mLs (45 Units total) into the skin 2 (two) times daily. 10 mL 1  . JARDIANCE 25 MG TABS tablet TAKE ONE TABLET BY MOUTH DAILY 90  tablet 3  . liraglutide (VICTOZA) 18 MG/3ML SOPN Inject 0.3 mLs (1.8 mg total) into the skin daily. INJECT 0.3 MLS (1.8 MG TOTAL) INTO THE SKIN DAILY 9 pen 3  . lisinopril (ZESTRIL) 2.5 MG tablet TAKE ONE TABLET BY MOUTH TWICE A DAY 180 tablet 1  . Melatonin 2.5 MG CAPS Take 5 mg by mouth at bedtime.    . metFORMIN (GLUCOPHAGE) 1000 MG tablet TAKE ONE TABLET BY MOUTH TWICE A DAY WITH MEALS 180 tablet 9  . mometasone-formoterol (DULERA) 200-5 MCG/ACT AERO INHALE TWO PUFFS BY MOUTH TWICE A DAY 39 g 2  . Multiple Vitamin (MULTIVITAMIN) tablet Take 1  tablet by mouth daily.    . nitroGLYCERIN (NITROSTAT) 0.4 MG SL tablet Use one under tongue for chest pain as needed 100 tablet 0  . Omega-3 Fatty Acids (FISH OIL) 1000 MG CAPS Take 1 capsule (1,000 mg total) by mouth daily.    . prasugrel (EFFIENT) 10 MG TABS tablet TAKE ONE TABLET BY MOUTH DAILY 90 tablet 1  . traMADol (ULTRAM) 50 MG tablet TAKE ONE TABLET BY MOUTH EVERY 6 HOURS AS NEEDED 30 tablet 2  . [DISCONTINUED] Blood Glucose Monitoring Suppl (ACCU-CHEK AVIVA PLUS) w/Device KIT CHECK BLOOD GLUCOSE FASTING AND 2 HOURS AFTER BREAKFAST, LUNCH AND DINNER 1 kit 0   No current facility-administered medications on file prior to visit.    Allergies  Allergen Reactions  . Shellfish Allergy Nausea And Vomiting    OYSTERS    Objective: Physical Exam  General: Well developed, nourished, no acute distress, awake, alert and oriented x 3  Vascular:  Capillary refill time to digits immediate b/l. Palpable DP pulses b/l. Palpable PT pulses b/l. Pedal hair sparse b/l. Skin temperature gradient within normal limits b/l.  Dermatological:  Pedal skin with normal turgor, texture and tone bilaterally. No open wounds bilaterally. No interdigital macerations bilaterally. Toenails 1-5 b/l elongated, dystrophic, thickened, crumbly with subungual debris and tenderness to dorsal palpation.  Musculoskeletal:  Normal muscle strength 5/5 to all lower extremity muscle groups bilaterally. No gross bony deformities bilaterally. No pain crepitus or joint limitation noted with ROM b/l.   Evaluation of shoes reveal extreme wear on plantarlateral aspect of both heel areas.  Neurological:  Protective sensation diminished with 10g monofilament b/l. Vibratory sensation diminished b/l.  Assessment and Plan:  Problem List Items Addressed This Visit    None    Visit Diagnoses    Pain due to onychomycosis of toenails of both feet    -  Primary   Type 2 diabetes mellitus without complication, with long-term  current use of insulin (HCC)       Acute right ankle pain         -Examined patient.  -Advised patient to purchase new shoes as I suspect the worn shoes are contributing to his right ankle pain.  -Patient referred to Dr. Sherryle Lis for right ankle pain.  -Discussed treatment options for painful mycotic nails. -Mechanically debrided and reduced mycotic nails with sterile nail nipper and dremel nail file without incident. -Continue diabetic foot care principles. -Patient to report any pedal injuries to medical professional immediately. -Patient to continue soft, supportive shoe gear daily. -Patient/POA to call should there be question/concern in the interim.  Return in about 3 months (around 10/14/2020).  Marzetta Board, DPM

## 2020-07-21 ENCOUNTER — Other Ambulatory Visit: Payer: Self-pay | Admitting: *Deleted

## 2020-07-21 ENCOUNTER — Other Ambulatory Visit: Payer: Self-pay | Admitting: Family Medicine

## 2020-07-21 DIAGNOSIS — I10 Essential (primary) hypertension: Secondary | ICD-10-CM

## 2020-07-21 DIAGNOSIS — I214 Non-ST elevation (NSTEMI) myocardial infarction: Secondary | ICD-10-CM

## 2020-07-22 MED ORDER — ACCU-CHEK AVIVA PLUS W/DEVICE KIT
PACK | 0 refills | Status: DC
Start: 2020-07-22 — End: 2020-07-24

## 2020-07-23 ENCOUNTER — Telehealth: Payer: Self-pay | Admitting: *Deleted

## 2020-07-23 ENCOUNTER — Other Ambulatory Visit: Payer: Self-pay

## 2020-07-23 ENCOUNTER — Ambulatory Visit: Payer: Medicaid Other | Admitting: Podiatry

## 2020-07-23 ENCOUNTER — Ambulatory Visit (INDEPENDENT_AMBULATORY_CARE_PROVIDER_SITE_OTHER): Payer: Medicaid Other

## 2020-07-23 DIAGNOSIS — M25571 Pain in right ankle and joints of right foot: Secondary | ICD-10-CM

## 2020-07-23 DIAGNOSIS — M216X1 Other acquired deformities of right foot: Secondary | ICD-10-CM

## 2020-07-23 NOTE — Telephone Encounter (Signed)
Per pharmacy accu-chek aviva plus is no longer available. Please send in accu-chek guide meter and test strips. Deleon Passe Kennon Holter, CMA

## 2020-07-24 MED ORDER — ACCU-CHEK GUIDE W/DEVICE KIT: PACK | 0 refills | Status: DC

## 2020-07-24 NOTE — Telephone Encounter (Signed)
Sent in

## 2020-07-26 ENCOUNTER — Encounter: Payer: Self-pay | Admitting: Podiatry

## 2020-07-26 NOTE — Progress Notes (Signed)
  Subjective:  Patient ID: [name] [name], male    DOB: Dec 31, 1960,  MRN: [id number]  Chief Complaint  Patient presents with  . Ankle Pain    right ankle pain  for about 2 weeks. Pt stated that it feels a little better since he got new shoes     59 y.o. male presents with the above complaint. History confirmed with patient. Referred for evaluation by Dr Elisha Ponder. He states most of the pain has resolved since it started and he switched shoes.  Objective:  Physical Exam: warm, good capillary refill, no trophic changes or ulcerative lesions and normal DP and PT pulses.ROM intact to ankle joint and is smooth bilaterally.  Radiographs: X-ray of right ankle: no fracture, dislocation, swelling or degenerative changes noted Assessment:   1. Acute right ankle pain      Plan:  Patient was evaluated and treated and all questions answered.  Appears to have resolved with shoe gear changes. Continue current course, f/u as scheduled with Dr Elisha Ponder, if it returns will re-evaluate  Return if symptoms worsen or fail to improve.

## 2020-07-30 ENCOUNTER — Telehealth: Payer: Self-pay | Admitting: *Deleted

## 2020-07-30 MED ORDER — ACCU-CHEK GUIDE VI STRP: ORAL_STRIP | 4 refills | Status: DC

## 2020-07-30 NOTE — Telephone Encounter (Signed)
rx request for accu-chek guide test strips. Jazsmin Couse Kennon Holter, CMA

## 2020-08-03 ENCOUNTER — Telehealth: Payer: Self-pay

## 2020-08-03 NOTE — Telephone Encounter (Signed)
Levada Dy, with [employer], LVM on nurse line requesting CPAP order, sleep study, and recent office notes stating the machine is working and improving sleep. IN order to get supplies to patient we need a office visit discussing use and benefits. Patient is scheduled already for 12/22. I LVM for patient asking to call me back to discuss more.   Once we have an updated office note I will fax to [phone].

## 2020-08-04 NOTE — Telephone Encounter (Signed)
Patient returns my call. Patient reports he is fine with waiting until 12/22 to discuss CPAP. Will forward to [place].  In 12/22 encounter notes please note use and benefits of CPAP.

## 2020-08-14 ENCOUNTER — Other Ambulatory Visit: Payer: Self-pay | Admitting: Family Medicine

## 2020-08-14 DIAGNOSIS — F32A Depression, unspecified: Secondary | ICD-10-CM

## 2020-08-28 ENCOUNTER — Other Ambulatory Visit: Payer: Self-pay | Admitting: Family Medicine

## 2020-08-28 DIAGNOSIS — J449 Chronic obstructive pulmonary disease, unspecified: Secondary | ICD-10-CM

## 2020-09-02 ENCOUNTER — Encounter: Payer: Self-pay | Admitting: Family Medicine

## 2020-09-02 ENCOUNTER — Other Ambulatory Visit: Payer: Self-pay

## 2020-09-02 ENCOUNTER — Ambulatory Visit (INDEPENDENT_AMBULATORY_CARE_PROVIDER_SITE_OTHER): Payer: Medicaid Other | Admitting: Family Medicine

## 2020-09-02 VITALS — BP 132/72 | HR 84 | Ht 67.0 in | Wt 293.2 lb

## 2020-09-02 DIAGNOSIS — G4733 Obstructive sleep apnea (adult) (pediatric): Secondary | ICD-10-CM | POA: Diagnosis not present

## 2020-09-02 DIAGNOSIS — Z720 Tobacco use: Secondary | ICD-10-CM | POA: Diagnosis not present

## 2020-09-02 DIAGNOSIS — Z794 Long term (current) use of insulin: Secondary | ICD-10-CM

## 2020-09-02 DIAGNOSIS — E785 Hyperlipidemia, unspecified: Secondary | ICD-10-CM | POA: Diagnosis not present

## 2020-09-02 DIAGNOSIS — Z9989 Dependence on other enabling machines and devices: Secondary | ICD-10-CM | POA: Diagnosis not present

## 2020-09-02 DIAGNOSIS — E119 Type 2 diabetes mellitus without complications: Secondary | ICD-10-CM | POA: Diagnosis not present

## 2020-09-02 LAB — POCT GLYCOSYLATED HEMOGLOBIN (HGB A1C): Hemoglobin A1C: 8 % — AB (ref 4.0–5.6)

## 2020-09-02 NOTE — Progress Notes (Signed)
    SUBJECTIVE:   CHIEF COMPLAINT / HPI:   SLEEP APNEA  Using machine nightly.  Helps with day time sleepiness and improves insomnia.  Reports  settings are 11.  He is unsure of exactly the type of machine he is using.  According to pulmonary noted was changed to Bipap in July 2021 by Dr Annamaria Boots  DIABETES Has been skipping lantus dose, every so often, when his blood sugar is low.  Lowest was 60 about 10 days ago.  Reports taking other medications as perscribed (knows the names)   TOBACCO Has not smoked for 8 days.  Intends to stop for good.  Using substitutes   PERTINENT  PMH / PSH: sees eye doctor every 6 months   OBJECTIVE:   BP 132/72   Pulse 84   Ht 5\' 7"  (1.702 m)   Wt 293 lb 3.2 oz (133 kg)   SpO2 95%   BMI 45.92 kg/m   Heart - Regular rate and rhythm.  No murmurs, gallops or rubs.    Lungs:  Normal respiratory effort, chest expands symmetrically. Lungs are clear to auscultation, no crackles or wheezes. Extremities:  No cyanosis, edema, or deformity noted with good range of motion of all major joints.     ASSESSMENT/PLAN:   Tobacco abuse Congratulated him on recent cessation.  Discussed ways to continue   Insulin dependent type 2 diabetes mellitus ([place]) Lab Results  Component Value Date   HGBA1C 8.0 (A) 09/02/2020   Increased.   Discussed not skipping lantus but decreasing novolog if his blood sugars are low and what is a low blood sugar <60 He will also continue exercise   Hyperlipidemia with target LDL less than 70 Taking medications regularly.  Check lipids   OSA on CPAP Stable on current regimen.  Ok to replace      Lind Covert, MD [place]

## 2020-09-02 NOTE — Patient Instructions (Addendum)
Good to see you today  For the DIABETES  Take lantus 45 units twice a day every day  Use the novolog twice a day - if you blood sugar is low then decrease to 25 u once  For the SMOKING  Really good that you have stopped  Keep using the mints and gum as a substitute  I will call you if your tests are not good.  Otherwise, I will send you a message on MyChart (if it is active) or a letter in the mail..  If you do not hear from me with in 2 weeks please call our office.     Great you are working on your weight  I will put the information about CPAP BIPAP in your records  You need an diabetes eye exam every year.  Please see your eye doctor.  Ask them to fax [place] a report of your exam  Come back in 3 months

## 2020-09-02 NOTE — Assessment & Plan Note (Signed)
Stable on current regimen.  Ok to replace

## 2020-09-02 NOTE — Assessment & Plan Note (Signed)
Lab Results  Component Value Date   HGBA1C 8.0 (A) 09/02/2020   Increased.   Discussed not skipping lantus but decreasing novolog if his blood sugars are low and what is a low blood sugar <60 He will also continue exercise

## 2020-09-02 NOTE — Assessment & Plan Note (Signed)
Taking medications regularly.  Check lipids

## 2020-09-02 NOTE — Assessment & Plan Note (Signed)
Congratulated him on recent cessation.  Discussed ways to continue

## 2020-09-03 ENCOUNTER — Encounter: Payer: Self-pay | Admitting: Family Medicine

## 2020-09-03 LAB — LDL CHOLESTEROL, DIRECT: LDL Direct: 70 mg/dL (ref 0–99)

## 2020-09-08 ENCOUNTER — Other Ambulatory Visit: Payer: Self-pay | Admitting: Family Medicine

## 2020-09-08 DIAGNOSIS — E1110 Type 2 diabetes mellitus with ketoacidosis without coma: Secondary | ICD-10-CM

## 2020-09-09 ENCOUNTER — Other Ambulatory Visit: Payer: Self-pay | Admitting: *Deleted

## 2020-09-09 DIAGNOSIS — J449 Chronic obstructive pulmonary disease, unspecified: Secondary | ICD-10-CM

## 2020-09-09 DIAGNOSIS — Z794 Long term (current) use of insulin: Secondary | ICD-10-CM

## 2020-09-11 MED ORDER — VICTOZA 18 MG/3ML ~~LOC~~ SOPN: 1.8000 mg | PEN_INJECTOR | Freq: Every day | SUBCUTANEOUS | 1 refills | Status: DC

## 2020-09-11 MED ORDER — DULERA 200-5 MCG/ACT IN AERO: INHALATION_SPRAY | RESPIRATORY_TRACT | 1 refills | Status: DC

## 2020-09-19 ENCOUNTER — Ambulatory Visit: Payer: Medicaid Other | Attending: Internal Medicine

## 2020-09-19 DIAGNOSIS — Z23 Encounter for immunization: Secondary | ICD-10-CM

## 2020-09-19 NOTE — Progress Notes (Signed)
   Covid-19 Vaccination Clinic  Name:  [name] [name]    MRN: [id number] DOB: [birth year]  09/19/2020  [name] was observed post Covid-19 immunization for 15 minutes without incident. He was provided with Vaccine Information Sheet and instruction to access the V-Safe system.   [name] was instructed to call 911 with any severe reactions post vaccine: Marland Kitchen Difficulty breathing  . Swelling of face and throat  . A fast heartbeat  . A bad rash all over body  . Dizziness and weakness   Immunizations Administered    Name Date Dose VIS Date Route   Pfizer COVID-19 Vaccine 09/19/2020 11:11 AM 0.3 mL 07/01/2020 Intramuscular   Manufacturer: [place]   Lot: [id number]   NDC: [address]-2

## 2020-09-23 ENCOUNTER — Telehealth: Payer: Self-pay

## 2020-09-23 NOTE — Telephone Encounter (Signed)
Patient calls reporting he has not received his Cpap supplies yet. I have reached out to [employer] for more information.

## 2020-09-29 NOTE — Telephone Encounter (Signed)
Patient is calling to check on status of receiving new Cpap machine. I informed him that a nurse reached out to check on supplies. He states that [employer] told him they have not received order. They asked the order be faxed to [phone]

## 2020-10-05 NOTE — Progress Notes (Signed)
10/08/2020 [name] [name]   20-Oct-1960  [id number]  Primary Physician Carney Living, MD Primary Cardiologist: [name]  HPI:  [name] [name] is seen today for follow up CAD. His past medical history is significant for CAD, DM, obesity, tobacco use , hyperlipidemia and hypertension. He is status post stenting of the right coronary artery in May of 2013. He was admitted with a NSTEMI inFebruary of 2015. He was found to have severe disease in LCx and OM2 treated with BMS.  He was placed on dual antiplatelet therapy with aspirin plus Effient.   He presented with increased angina in October 2015. A Myoview study was intermediate risk.  He had repeat cardiac cath in November 2015.  He was found to have severe single-vessel disease involving the bifurcation of OM1 and OM 2 with 99% in-stent restenosis in the bare-metal stent placed in the OM 2. He underwent successful PCI spanning the proximal circumflex stent across OM 1 through the bare-metal stent placed in OM 2 with a Xience alpine DES stent. He was also noted to have a widely patent stent in the RCA and proximal circumflex with otherwise mild-moderate disease. He has preserved LVEF with an ejection fraction of 55-65%. He was instructed to continue with DAPT with ASA + Effient.   On follow up today he reports he is doing well. He denies any chest pain or SOB. He did quit smoking at the new year.  Weight is stable.  Still exercises 2-3 times a week at [employer]..   Current Outpatient Medications  Medication Sig Dispense Refill  . ACCU-CHEK AVIVA PLUS test strip USE ONE STRIP TO TEST FOUR TIMES A DAY 100 strip 10  . ACCU-CHEK FASTCLIX LANCETS MISC 1 Units by Percutaneous route 4 (four) times daily. 100 each 12  . albuterol (PROAIR HFA) 108 (90 Base) MCG/ACT inhaler Inhale 2 puffs into the lungs every 6 (six) hours as needed for wheezing or shortness of breath. 18 g 2  . aspirin 81 MG chewable tablet Chew 1 tablet (81 mg total) by mouth daily.  180 tablet 2  . atorvastatin (LIPITOR) 80 MG tablet TAKE ONE TABLET BY MOUTH DAILY 90 tablet 1  . BD INSULIN SYRINGE U/F 31G X 5/16" 1 ML MISC USE UP TO FIVE TIMES A DAY WITH NOVOLOG AND LANTUS DOSING 100 each 1  . BD ULTRA-FINE PEN NEEDLES 29G X 12.7MM MISC USE TO WITH VICTOZA PEN, INJECTION ONCE DAILY. 100 each 9  . Blood Glucose Monitoring Suppl (ACCU-CHEK GUIDE) w/Device KIT Check blood sugar once each morning 1 kit 0  . buPROPion (WELLBUTRIN XL) 150 MG 24 hr tablet Take 1 tablet (150 mg total) by mouth daily. 90 tablet 1  . carvedilol (COREG) 3.125 MG tablet Take 1 tablet (3.125 mg total) by mouth 2 (two) times daily with a meal. 180 tablet 3  . ciprofloxacin-dexamethasone (CIPRODEX) OTIC suspension Place 4 drops into the left ear 2 (two) times daily.    . citalopram (CELEXA) 20 MG tablet TAKE ONE TABLET BY MOUTH DAILY 90 tablet 0  . glucose blood (ACCU-CHEK GUIDE) test strip Use as instructed 100 each 4  . hydrocortisone 2.5 % ointment Apply topically 2 (two) times daily. 30 g 0  . hydrOXYzine (VISTARIL) 25 MG capsule TAKE ONE CAPSULE BY MOUTH EVERY NIGHT AT BEDTIME 30 capsule 2  . insulin aspart (NOVOLOG) 100 UNIT/ML injection Inject 30 Units into the skin 2 (two) times daily with a meal. 30 mL 5  . insulin glargine (LANTUS)  100 UNIT/ML injection Inject 0.45 mLs (45 Units total) into the skin 2 (two) times daily. 10 mL 1  . JARDIANCE 25 MG TABS tablet TAKE ONE TABLET BY MOUTH DAILY 90 tablet 3  . liraglutide (VICTOZA) 18 MG/3ML SOPN Inject 1.8 mg into the skin daily. 27 mL 1  . lisinopril (ZESTRIL) 2.5 MG tablet TAKE ONE TABLET BY MOUTH TWICE A DAY 180 tablet 1  . Melatonin 2.5 MG CAPS Take 5 mg by mouth at bedtime.    . metFORMIN (GLUCOPHAGE) 1000 MG tablet TAKE ONE TABLET BY MOUTH TWICE A DAY WITH MEALS 180 tablet 9  . mometasone-formoterol (DULERA) 200-5 MCG/ACT AERO INHALE TWO PUFFS BY MOUTH TWICE A DAY 39 g 1  . Multiple Vitamin (MULTIVITAMIN) tablet Take 1 tablet by mouth daily.     . nitroGLYCERIN (NITROSTAT) 0.4 MG SL tablet DISSOLVE 1 TAB UNDER TONGUE FOR CHEST PAIN - IF PAIN REMAINS AFTER 5 MIN, CALL 911 AND REPEAT DOSE. MAX 3 TABS IN 15 MINUTES 100 tablet 0  . Omega-3 Fatty Acids (FISH OIL) 1000 MG CAPS Take 1 capsule (1,000 mg total) by mouth daily.    . traMADol (ULTRAM) 50 MG tablet TAKE ONE TABLET BY MOUTH EVERY 6 HOURS AS NEEDED 30 tablet 2  . prasugrel (EFFIENT) 10 MG TABS tablet Take 1 tablet (10 mg total) by mouth daily. 90 tablet 3   No current facility-administered medications for this visit.    Allergies  Allergen Reactions  . Shellfish Allergy Nausea And Vomiting    OYSTERS    Social History   Socioeconomic History  . Marital status: Single    Spouse name: Not on file  . Number of children: 0  . Years of education: 10  . Highest education level: Not on file  Occupational History  . Occupation: Unemployed    Comment: Used to work for [employer]  . Occupation: Consulting civil engineer, Museum/gallery curator  Tobacco Use  . Smoking status: Former Smoker    Packs/day: 1.00    Years: 42.00    Pack years: 42.00    Types: Cigarettes    Start date: 09/13/1975    Quit date: 01/25/2018    Years since quitting: 2.7  . Smokeless tobacco: Current User    Types: Chew  . Tobacco comment: Bupropion helping  Vaping Use  . Vaping Use: Never used  Substance and Sexual Activity  . Alcohol use: Yes    Alcohol/week: 6.0 standard drinks    Types: 6 Cans of beer per week    Comment: pt reports "I quit 66 days ago" (05/09/16)  . Drug use: No    Comment: Crack Cocaine (denies 05/09/16)  . Sexual activity: Not Currently  Other Topics Concern  . Not on file  Social History Narrative   Single.  Lives with a roommate.  Ambulates independently.   Social Determinants of Health   Financial Resource Strain: Not on file  Food Insecurity: Not on file  Transportation Needs: Not on file  Physical Activity: Not on file  Stress: Not on file  Social Connections: Not on  file  Intimate Partner Violence: Not on file     Review of Systems: AS noted in HPI All other systems reviewed and are otherwise negative except as noted above.  Blood pressure (!) 110/50, pulse 69, height 5\' 7"  (1.702 m), weight 296 lb 6.4 oz (134.4 kg), SpO2 93 %.  GENERAL:  Well appearing obese WM in NAD HEENT:  PERRL, EOMI, sclera are clear. Oropharynx  is clear. NECK:  No jugular venous distention, carotid upstroke brisk and symmetric, no bruits, no thyromegaly or adenopathy LUNGS:  Clear to auscultation bilaterally CHEST:  Unremarkable HEART:  RRR,  PMI not displaced or sustained,S1 and S2 within normal limits, no S3, no S4: no clicks, no rubs, no murmurs ABD:  Soft, nontender. BS +, no masses or bruits. No hepatomegaly, no splenomegaly EXT:  2 + pulses throughout, no edema, no cyanosis no clubbing SKIN:  Warm and dry.  No rashes NEURO:  Alert and oriented x 3. Cranial nerves II through XII intact. PSYCH:  Cognitively intact      Laboratory data:  Lab Results  Component Value Date   WBC 9.8 01/29/2020   HGB 15.2 01/29/2020   HCT 46.5 01/29/2020   PLT 218 01/29/2020   GLUCOSE 260 (H) 01/29/2020   CHOL 94 (L) 05/09/2019   TRIG 143 05/09/2019   HDL 33 (L) 05/09/2019   LDLDIRECT 70 09/02/2020   LDLCALC 32 05/09/2019   ALT 16 05/09/2019   AST 17 05/09/2019   NA 138 01/29/2020   K 4.2 01/29/2020   CL 104 01/29/2020   CREATININE 0.72 01/29/2020   BUN 11 01/29/2020   CO2 26 01/29/2020   TSH 1.100 01/18/2012   INR 0.96 07/28/2014   HGBA1C 8.0 (A) 09/02/2020   MICROALBUR 4.7 05/23/2016      ASSESSMENT AND PLAN:   1. CAD: Status post repeat PCI to the proximal circumflex and OM 2 in Nov. 2015 as outlined above with DES for instent restenosis. He is asymptomatic.  Continue dual antiplatelet therapy with aspirin plus Effient indefinitely due to extensive nature of stents.  Continue beta blocker, statin and ACE inhibitor for secondary prevention.  2. Hypertension:  Blood pressure is well-controlled. Continue lisinopril and Coreg  3. Hyperlipidemia: on statin. LDL 70 last month.  4. Diabetes: per primary care. Encourage weight loss and continued physical exercise. A1c 8%.   5. Tobacco abuse: patient has quit. Congratulated.   I will follow up in 6 months   Danai Gotto [place] MD,FACC  10/08/2020 2:41 PM

## 2020-10-06 NOTE — Telephone Encounter (Signed)
Spoke with [name] [name] New from [place]. He reports some confusion on what the patient is wanting. [name] [name] states the patient has been calling [employer], however they are unsure of what he needs. There is an order from July 2021 and face to face notes from December 2021. I spoke with patient and he reports his machine is broken and needs a new one. I have sent another message to [place] him of this and waiting to see what is exactly needed from [place] at this time.

## 2020-10-07 ENCOUNTER — Other Ambulatory Visit: Payer: Self-pay | Admitting: Family Medicine

## 2020-10-07 DIAGNOSIS — Z9861 Coronary angioplasty status: Secondary | ICD-10-CM

## 2020-10-07 DIAGNOSIS — E785 Hyperlipidemia, unspecified: Secondary | ICD-10-CM

## 2020-10-07 DIAGNOSIS — I2510 Atherosclerotic heart disease of native coronary artery without angina pectoris: Secondary | ICD-10-CM

## 2020-10-08 ENCOUNTER — Encounter: Payer: Self-pay | Admitting: Cardiology

## 2020-10-08 ENCOUNTER — Other Ambulatory Visit: Payer: Self-pay

## 2020-10-08 ENCOUNTER — Ambulatory Visit (INDEPENDENT_AMBULATORY_CARE_PROVIDER_SITE_OTHER): Payer: Medicaid Other | Admitting: Cardiology

## 2020-10-08 VITALS — BP 110/50 | HR 69 | Ht 67.0 in | Wt 296.4 lb

## 2020-10-08 DIAGNOSIS — E782 Mixed hyperlipidemia: Secondary | ICD-10-CM

## 2020-10-08 DIAGNOSIS — I2510 Atherosclerotic heart disease of native coronary artery without angina pectoris: Secondary | ICD-10-CM

## 2020-10-08 DIAGNOSIS — Z9861 Coronary angioplasty status: Secondary | ICD-10-CM

## 2020-10-08 DIAGNOSIS — Z720 Tobacco use: Secondary | ICD-10-CM | POA: Diagnosis not present

## 2020-10-08 DIAGNOSIS — I10 Essential (primary) hypertension: Secondary | ICD-10-CM | POA: Diagnosis not present

## 2020-10-08 MED ORDER — PRASUGREL HCL 10 MG PO TABS: 10.0000 mg | ORAL_TABLET | Freq: Every day | ORAL | 3 refills | Status: DC

## 2020-10-12 NOTE — Telephone Encounter (Signed)
Patient is needing this refill badly he keeps telling pharmacy it is no longer Dr. Kennon Rounds, that Dr. Has changed to [facility].  Please advise Thanks!

## 2020-10-12 NOTE — Telephone Encounter (Signed)
Received message from [employer] stating they received what they needed from [place]. Advised [employer] to reach out via community message if anything else is needed.

## 2020-10-13 ENCOUNTER — Other Ambulatory Visit: Payer: Self-pay | Admitting: *Deleted

## 2020-10-13 DIAGNOSIS — E785 Hyperlipidemia, unspecified: Secondary | ICD-10-CM

## 2020-10-13 DIAGNOSIS — Z9861 Coronary angioplasty status: Secondary | ICD-10-CM

## 2020-10-13 DIAGNOSIS — I2510 Atherosclerotic heart disease of native coronary artery without angina pectoris: Secondary | ICD-10-CM

## 2020-10-13 MED ORDER — ATORVASTATIN CALCIUM 80 MG PO TABS: 80.0000 mg | ORAL_TABLET | Freq: Every day | ORAL | 1 refills | Status: DC

## 2020-10-26 ENCOUNTER — Ambulatory Visit: Payer: Medicaid Other | Admitting: Podiatry

## 2020-10-26 ENCOUNTER — Other Ambulatory Visit: Payer: Self-pay

## 2020-10-26 DIAGNOSIS — E119 Type 2 diabetes mellitus without complications: Secondary | ICD-10-CM

## 2020-10-26 DIAGNOSIS — M79674 Pain in right toe(s): Secondary | ICD-10-CM | POA: Diagnosis not present

## 2020-10-26 DIAGNOSIS — B351 Tinea unguium: Secondary | ICD-10-CM

## 2020-10-26 DIAGNOSIS — M79675 Pain in left toe(s): Secondary | ICD-10-CM | POA: Diagnosis not present

## 2020-10-26 DIAGNOSIS — Z794 Long term (current) use of insulin: Secondary | ICD-10-CM

## 2020-10-26 DIAGNOSIS — B353 Tinea pedis: Secondary | ICD-10-CM

## 2020-10-26 DIAGNOSIS — R21 Rash and other nonspecific skin eruption: Secondary | ICD-10-CM | POA: Diagnosis not present

## 2020-10-26 MED ORDER — MICONAZOLE NITRATE 2 % EX CREA: TOPICAL_CREAM | CUTANEOUS | 0 refills | Status: DC

## 2020-10-26 MED ORDER — MUPIROCIN 2 % EX OINT: TOPICAL_OINTMENT | CUTANEOUS | 1 refills | Status: DC

## 2020-10-27 ENCOUNTER — Other Ambulatory Visit: Payer: Self-pay | Admitting: Family Medicine

## 2020-10-27 DIAGNOSIS — I10 Essential (primary) hypertension: Secondary | ICD-10-CM

## 2020-10-27 DIAGNOSIS — I214 Non-ST elevation (NSTEMI) myocardial infarction: Secondary | ICD-10-CM

## 2020-11-01 ENCOUNTER — Encounter: Payer: Self-pay | Admitting: Podiatry

## 2020-11-01 NOTE — Progress Notes (Signed)
Subjective: [name] [name] is a 60 y.o. male patient seen today preventative diabetic foot care and painful mycotic nails b/l that are difficult to trim. Pain interferes with ambulation. Aggravating factors include wearing enclosed shoe gear. Pain is relieved with periodic professional debridement.  He states he has a new problem on today. He has skin eruption on left foot between great toe and 2nd toe.    Allergies  Allergen Reactions  . Shellfish Allergy Nausea And Vomiting    OYSTERS    Objective: Physical Exam  General: Well developed, nourished, no acute distress, awake, alert and oriented x 3  Vascular:  Capillary refill time to digits immediate b/l. Palpable DP pulses b/l. Palpable PT pulses b/l. Pedal hair sparse b/l. Skin temperature gradient within normal limits b/l.  Dermatological:  Pedal skin with normal turgor, texture and tone bilaterally. No open wounds bilaterally. No interdigital macerations bilaterally. Toenails 1-5 b/l elongated, dystrophic, thickened, crumbly with subungual debris and tenderness to dorsal palpation.   Erythematous skin eruption with dorsal aspect 1st webspace. Interdigital webspace with mild maceration. No erythema, no edema, no drainage, no fluctuance.  Musculoskeletal:  Normal muscle strength 5/5 to all lower extremity muscle groups bilaterally. No gross bony deformities bilaterally. No pain crepitus or joint limitation noted with ROM b/l.   Neurological:  Protective sensation diminished with 10g monofilament b/l. Vibratory sensation diminished b/l.  Assessment and Plan:   1. Pain due to onychomycosis of toenails of both feet   2. Localized skin eruption   3. Tinea pedis of both feet   4. Type 2 diabetes mellitus without complication, with long-term current use of insulin ([place])    -Examined patient.  -Rx sent to pharmacy for Mupirocin Ointment to be applied to left great toe and 2nd toe every evening. -Rx sent to pharmacy for Miconazole  Cream 2% to be applied between toes every morning. -Discussed treatment options for painful mycotic nails. -Mechanically debrided and reduced mycotic nails with sterile nail nipper and dremel nail file without incident. -Continue diabetic foot care principles. -Patient to report any pedal injuries to medical professional immediately. -Patient to continue soft, supportive shoe gear daily. -Patient/POA to call should there be question/concern in the interim.  Return in about 3 months (around 01/23/2021).  Marzetta Board, DPM

## 2020-11-06 ENCOUNTER — Other Ambulatory Visit: Payer: Self-pay | Admitting: Family Medicine

## 2020-11-06 DIAGNOSIS — Z794 Long term (current) use of insulin: Secondary | ICD-10-CM

## 2020-11-07 ENCOUNTER — Other Ambulatory Visit: Payer: Self-pay | Admitting: Family Medicine

## 2020-11-07 DIAGNOSIS — E1159 Type 2 diabetes mellitus with other circulatory complications: Secondary | ICD-10-CM

## 2020-11-10 ENCOUNTER — Other Ambulatory Visit (INDEPENDENT_AMBULATORY_CARE_PROVIDER_SITE_OTHER): Payer: Medicaid Other | Admitting: Podiatry

## 2020-11-10 MED ORDER — MICONAZOLE NITRATE 2 % EX AERO: INHALATION_SPRAY | CUTANEOUS | 2 refills | Status: DC

## 2020-11-10 NOTE — Progress Notes (Signed)
Sent Rx for Miconazole Spray Powder to spray between toes once daily.

## 2020-11-16 ENCOUNTER — Other Ambulatory Visit: Payer: Self-pay

## 2020-11-16 DIAGNOSIS — E1159 Type 2 diabetes mellitus with other circulatory complications: Secondary | ICD-10-CM

## 2020-11-16 DIAGNOSIS — Z794 Long term (current) use of insulin: Secondary | ICD-10-CM

## 2020-11-16 MED ORDER — ACCU-CHEK AVIVA PLUS VI STRP: ORAL_STRIP | 3 refills | Status: DC

## 2020-11-23 ENCOUNTER — Other Ambulatory Visit: Payer: Self-pay

## 2020-11-23 DIAGNOSIS — F32A Depression, unspecified: Secondary | ICD-10-CM

## 2020-11-23 MED ORDER — CITALOPRAM HYDROBROMIDE 20 MG PO TABS: 20.0000 mg | ORAL_TABLET | Freq: Every day | ORAL | 1 refills | Status: DC

## 2020-11-24 ENCOUNTER — Encounter: Payer: Self-pay | Admitting: Family Medicine

## 2020-11-24 ENCOUNTER — Other Ambulatory Visit: Payer: Self-pay

## 2020-11-24 ENCOUNTER — Ambulatory Visit (INDEPENDENT_AMBULATORY_CARE_PROVIDER_SITE_OTHER): Payer: Medicaid Other | Admitting: Family Medicine

## 2020-11-24 VITALS — BP 140/88 | HR 85 | Wt 294.0 lb

## 2020-11-24 DIAGNOSIS — E119 Type 2 diabetes mellitus without complications: Secondary | ICD-10-CM | POA: Diagnosis present

## 2020-11-24 DIAGNOSIS — Z720 Tobacco use: Secondary | ICD-10-CM

## 2020-11-24 DIAGNOSIS — Z794 Long term (current) use of insulin: Secondary | ICD-10-CM | POA: Diagnosis not present

## 2020-11-24 DIAGNOSIS — I10 Essential (primary) hypertension: Secondary | ICD-10-CM | POA: Diagnosis not present

## 2020-11-24 LAB — POCT GLYCOSYLATED HEMOGLOBIN (HGB A1C): HbA1c, POC (controlled diabetic range): 7.4 % — AB (ref 0.0–7.0)

## 2020-11-24 NOTE — Patient Instructions (Signed)
Good to see you today - Thank you for coming in  Things we discussed today:  Blood pressure - a little high - take lisinopril twice a day every day  Your diabetes is doing well  Congrats on not smoking  Try to continue to work on weight   Please always bring your medication bottles  Come back to see me in 3 months

## 2020-11-24 NOTE — Assessment & Plan Note (Signed)
Continues to not smoke.  Congratulated him.  Discussed weight management

## 2020-11-24 NOTE — Progress Notes (Signed)
    SUBJECTIVE:   CHIEF COMPLAINT / HPI:   Diabetes No low blood sugars.  Did not bring medications but remembers most of them   Tobacco Abuse No smoking for more than 3 months.  Using nicorette gum and taking wellbutrin.  Hypertension Had been taking lisinopril only once a day.  Agrees to go back to taking twice a day.   No chest pain or shortness of breath or edema   PERTINENT  PMH / PSH: saw Dr Martinique in Jan 2022  OBJECTIVE:   BP 140/88   Pulse 85   Wt 294 lb (133.4 kg)   SpO2 95%   BMI 46.05 kg/m   Good spirits Heart - Regular rate and rhythm.  No murmurs, gallops or rubs.    Lungs:  Normal respiratory effort, chest expands symmetrically. Lungs are clear to auscultation, no crackles or wheezes. Extremities:  No cyanosis, edema, or deformity noted with good range of motion of all major joints.      ASSESSMENT/PLAN:   Insulin dependent type 2 diabetes mellitus (HCC) Good control on multiple medications.  Continue  Tobacco abuse Continues to not smoke.  Congratulated him.  Discussed weight management   Essential hypertension Not quite at goal.  Go back to twice a day low dose lisinopril.  Check labs next visit     Lind Covert, MD [place]

## 2020-11-24 NOTE — Assessment & Plan Note (Signed)
Good control on multiple medications.  Continue

## 2020-11-24 NOTE — Assessment & Plan Note (Addendum)
Not quite at goal.  Go back to twice a day low dose lisinopril.  Check labs next visit

## 2020-12-14 ENCOUNTER — Telehealth: Payer: Self-pay | Admitting: *Deleted

## 2020-12-14 MED ORDER — KETOCONAZOLE 2 % EX CREA: 1.0000 "application " | TOPICAL_CREAM | Freq: Every day | CUTANEOUS | 1 refills | Status: DC

## 2021-01-01 ENCOUNTER — Other Ambulatory Visit: Payer: Self-pay | Admitting: Family Medicine

## 2021-01-01 DIAGNOSIS — E1159 Type 2 diabetes mellitus with other circulatory complications: Secondary | ICD-10-CM

## 2021-01-01 DIAGNOSIS — Z794 Long term (current) use of insulin: Secondary | ICD-10-CM

## 2021-01-08 ENCOUNTER — Other Ambulatory Visit: Payer: Self-pay | Admitting: Family Medicine

## 2021-01-08 DIAGNOSIS — Z794 Long term (current) use of insulin: Secondary | ICD-10-CM

## 2021-01-12 ENCOUNTER — Other Ambulatory Visit: Payer: Self-pay

## 2021-01-12 DIAGNOSIS — Z794 Long term (current) use of insulin: Secondary | ICD-10-CM

## 2021-01-12 MED ORDER — METFORMIN HCL 1000 MG PO TABS: 1.0000 | ORAL_TABLET | Freq: Two times a day (BID) | ORAL | 3 refills | Status: DC

## 2021-01-14 ENCOUNTER — Encounter: Payer: Self-pay | Admitting: Gastroenterology

## 2021-01-14 ENCOUNTER — Ambulatory Visit: Payer: Medicaid Other | Admitting: Gastroenterology

## 2021-01-14 ENCOUNTER — Telehealth: Payer: Self-pay

## 2021-01-14 VITALS — BP 134/64 | HR 69 | Ht 67.0 in | Wt 292.4 lb

## 2021-01-14 DIAGNOSIS — Z86010 Personal history of colonic polyps: Secondary | ICD-10-CM | POA: Diagnosis not present

## 2021-01-14 DIAGNOSIS — Z7902 Long term (current) use of antithrombotics/antiplatelets: Secondary | ICD-10-CM | POA: Diagnosis not present

## 2021-01-14 MED ORDER — SUTAB 1479-225-188 MG PO TABS: 1.0000 | ORAL_TABLET | Freq: Once | ORAL | 0 refills | Status: AC

## 2021-01-14 NOTE — Telephone Encounter (Signed)
[facility] Pre-operative Risk Assessment     Request for surgical clearance:     Endoscopy Procedure  What type of surgery is being performed?     Colonoscopy   When is this surgery scheduled?     02-25-21  What type of clearance is required ?   Pharmacy  Are there any medications that need to be held prior to surgery and how long? EFFIENT 5 DAYS  Practice name and name of physician performing surgery?  [facility]  What is your office phone and fax number?      Phone- [phone]  Fax- [phone] ATTN: Tia Alert, CMA  Anesthesia type (None, local, MAC, general) ?   MAC  Thank you!

## 2021-01-14 NOTE — Telephone Encounter (Signed)
Called and spoke to patient.  He understands to hold Effient starting on 02-20-21 for procedure.  He understands he can continue baby aspirin those day.

## 2021-01-14 NOTE — Patient Instructions (Addendum)
If you are age 60 or older, your body mass index should be between 23-30. Your Body mass index is 45.79 kg/m. If this is out of the aforementioned range listed, please consider follow up with your Primary Care Provider.  If you are age 65 or younger, your body mass index should be between 19-25. Your Body mass index is 45.79 kg/m. If this is out of the aformentioned range listed, please consider follow up with your Primary Care Provider.   You have been scheduled for a colonoscopy. Please follow written instructions given to you at your visit today.  Please pick up your prep supplies at the pharmacy within the next 1-3 days. If you use inhalers (even only as needed), please bring them with you on the day of your procedure.  WE are giving you a SUTAB sample today to use for your colonoscopy prep. DO NOT FOLLOW THE DIRECTION ON THE BOX.  Please use the directions given to you at your appointment.  You will be contacted by our office prior to your procedure for directions on holding your Effient.  If you do not hear from our office 1 week prior to your scheduled procedure, please call [phone] to discuss.   Thank you for entrusting me with your care and for choosing [facility], Dr. [place] Cellar

## 2021-01-14 NOTE — Telephone Encounter (Signed)
    [name] [name] DOB:  Aug 04, 1961  MRN:  [id number]   Primary Cardiologist: Peter Martinique, MD  Chart reviewed as part of pre-operative protocol coverage.   Per previous recommendations by [name] and given lack of interval change in cardiac history since that time, patient can hold effient 1 week prior to his upcoming colonoscopy with plans to restart as soon as he is cleared to do so by his gastroenterologist.   I will route this recommendation to the requesting party via [place] fax function and remove from pre-op pool.  Please call with questions.  Abigail Butts, PA-C 01/14/2021, 4:42 PM

## 2021-01-14 NOTE — Progress Notes (Signed)
HPI :  60 year old male with a history of CAD, history of coronary stents in place, on Effient and aspirin, history of colon polyps, here for follow-up visit to discuss surveillance colonoscopy.  He had a colonoscopy with me 1 year ago, May 2021.  He had 11 small adenomas removed at that time.  I had recommended a follow-up colonoscopy in 1 year.  He is doing well without complaints today.  He has no bowel problems that bother him.  No blood in his stool.  No abdominal pain.  He denies any cardiopulmonary symptoms of bother him, he was recently seen by cardiology in January.  He follows with [name].  He states after his colonoscopy, he felt well when he got home.  He went out to [facility] for dinner that night and states he developed shortness of breath while eating dinner and went to the emergency room for 4 hours to be evaluated and his symptoms abated.  He is not sure what caused it and asks if this would happen again.  We discussed how he wanted to proceed moving forward.  Colonoscopy 01/29/20 - The perianal and digital rectal examinations were normal. - Two sessile polyps were found in the cecum. The polyps were 3 mm in size. These polyps were removed with a cold snare. Resection and retrieval were complete. - A 3 mm polyp was found in the ascending colon. The polyp was sessile. The polyp was removed with a cold snare. Resection and retrieval were complete. - Two sessile polyps were found in the hepatic flexure. The polyps were 2 to 3 mm in size. These polyps were removed with a cold snare. Resection and retrieval were complete. - Three sessile polyps were found in the transverse colon. The polyps were 3 to 5 mm in size. These polyps were removed with a cold snare. Resection and retrieval were complete. - Two sessile polyps were found in the descending colon. The polyps were 2 to 3 mm in size. These polyps were removed with a cold snare. Resection and retrieval were complete. - A 3 mm polyp was  found in the rectum. The polyp was sessile. The polyp was removed with a cold snare. Resection and retrieval were complete. - The exam was otherwise without abnormality.  Surgical [P], colon, cecum, ascending, hepatic flexure, transverse, descending, rectosigmoid, polyp (10) - MULTIPLE FRAGMENTS OF TUBULAR ADENOMA. - NO HIGH GRADE DYSPLASIA OR MALIGNANCY      Past Medical History:  Diagnosis Date  . Alcoholism ([place])   . Anxiety   . Arthritis    "qwhere"  . Asthma   . CAD S/P percutaneous coronary angioplasty 01/18/12; 10/2013   a. pRCA 3.5 x 18 vision BMS - 4.2 mm; b. 2/'15: mCx 3.5 x 12  Rebel BMS (3.6-3.7 mm)  . Chronic back pain    "mid/lower" (08/01/2014)  . COPD (chronic obstructive pulmonary disease) ([place])    "I'm seeing COPD dr now; don't know if I've got it" (08/01/2014)  . Depression   . GERD (gastroesophageal reflux disease)   . Hyperlipidemia   . Hypertension   . Migraines    "once in awhile" (08/01/2014)  . Non-ST elevation myocardial infarction (NSTEMI) ([place]) 10/2013  . OSA on CPAP 01/19/2012  . Sleep apnea   . Type II diabetes mellitus ([place])      Past Surgical History:  Procedure Laterality Date  . CARDIAC CATHETERIZATION  07/2014   Left Main: Short, large-caliber vessel. Widely patent. Bifurcates into the LAD and Circumflex.  Angiographically normal.  . CORONARY ANGIOPLASTY WITH STENT PLACEMENT  01/2012; 11/07/2013; 08/01/2014   "1 + 2 + 1"   . CORONARY ANGIOPLASTY WITH STENT PLACEMENT  07/2014   Severe single-vessel disease involving the bifurcation of OM1 and OM 2 with 99% in-stent restenosis in the bare-metal stent placed in the OM 2.  . LEFT HEART CATHETERIZATION WITH CORONARY ANGIOGRAM N/A 01/20/2012   Procedure: LEFT HEART CATHETERIZATION WITH CORONARY ANGIOGRAM;  Surgeon: Jettie Booze, MD;  Location: [facility] CATH LAB;  Service: Cardiovascular;  Laterality: N/A;  . LEFT HEART CATHETERIZATION WITH CORONARY ANGIOGRAM N/A 11/07/2013   Procedure: LEFT HEART  CATHETERIZATION WITH CORONARY ANGIOGRAM;  Surgeon: Jettie Booze, MD;  Location: [facility] CATH LAB;  Service: Cardiovascular;  Laterality: N/A;  . LEFT HEART CATHETERIZATION WITH CORONARY ANGIOGRAM N/A 08/01/2014   Procedure: LEFT HEART CATHETERIZATION WITH CORONARY ANGIOGRAM;  Surgeon: Leonie Man, MD;  Location: [facility] CATH LAB;  Service: Cardiovascular;  Laterality: N/A;  . MULTIPLE TOOTH EXTRACTIONS    . PERCUTANEOUS CORONARY STENT INTERVENTION (PCI-S)  01/20/2012   Procedure: PERCUTANEOUS CORONARY STENT INTERVENTION (PCI-S);  Surgeon: Jettie Booze, MD;  Location: [facility] CATH LAB;  Service: Cardiovascular;;  . PERCUTANEOUS CORONARY STENT INTERVENTION (PCI-S)  11/07/2013   Procedure: PERCUTANEOUS CORONARY STENT INTERVENTION (PCI-S);  Surgeon: Jettie Booze, MD;  Location: [facility] CATH LAB;  Service: Cardiovascular;;   Family History  Problem Relation Age of Onset  . Heart attack Father 65  . Breast cancer Mother   . Hypertension Sister   . Hyperlipidemia Sister   . Hyperlipidemia Sister   . Hypertension Sister   . Heart attack Brother   . Stroke Brother   . Diabetes Brother   . Diabetes Sister    Social History   Tobacco Use  . Smoking status: Former Smoker    Packs/day: 1.00    Years: 42.00    Pack years: 42.00    Types: Cigarettes    Start date: 09/13/1975    Quit date: 01/25/2018    Years since quitting: 2.9  . Smokeless tobacco: Current User    Types: Chew  . Tobacco comment: Bupropion helping  Vaping Use  . Vaping Use: Never used  Substance Use Topics  . Alcohol use: Yes    Alcohol/week: 6.0 standard drinks    Types: 6 Cans of beer per week    Comment: pt reports "I quit 66 days ago" (05/09/16)  . Drug use: No    Comment: Crack Cocaine (denies 05/09/16)   Current Outpatient Medications  Medication Sig Dispense Refill  . ACCU-CHEK FASTCLIX LANCETS MISC 1 Units by Percutaneous route 4 (four) times daily. 100 each 12  . albuterol (PROAIR HFA) 108 (90 Base) MCG/ACT  inhaler Inhale 2 puffs into the lungs every 6 (six) hours as needed for wheezing or shortness of breath. 18 g 2  . aspirin 81 MG chewable tablet Chew 1 tablet (81 mg total) by mouth daily. 180 tablet 2  . atorvastatin (LIPITOR) 80 MG tablet Take 1 tablet (80 mg total) by mouth daily. 90 tablet 1  . BD INSULIN SYRINGE U/F 31G X 5/16" 1 ML MISC USE UP TO FIVE TIMES A DAY WITH NOVOLOG AND LANTUS DOSING 100 each 1  . BD ULTRA-FINE PEN NEEDLES 29G X 12.7MM MISC USE TO WITH VICTOZA PEN, INJECTION ONCE DAILY. 100 each 9  . Blood Glucose Monitoring Suppl (ACCU-CHEK GUIDE) w/Device KIT Check blood sugar once each morning 1 kit 0  . buPROPion (WELLBUTRIN XL) 150 MG  24 hr tablet Take 1 tablet (150 mg total) by mouth daily. 90 tablet 1  . carvedilol (COREG) 3.125 MG tablet Take 1 tablet (3.125 mg total) by mouth 2 (two) times daily with a meal. 180 tablet 3  . ciprofloxacin-dexamethasone (CIPRODEX) OTIC suspension Place 4 drops into the left ear 2 (two) times daily.    . citalopram (CELEXA) 20 MG tablet Take 1 tablet (20 mg total) by mouth daily. 90 tablet 1  . glucose blood (ACCU-CHEK AVIVA PLUS) test strip Use as instructed 100 strip 3  . glucose blood (ACCU-CHEK GUIDE) test strip Use as instructed 100 each 4  . JARDIANCE 25 MG TABS tablet TAKE ONE TABLET BY MOUTH DAILY 90 tablet 3  . ketoconazole (NIZORAL) 2 % cream Apply 1 application topically daily. (Patient taking differently: Apply 1 application topically as needed.) 15 g 1  . LANTUS 100 UNIT/ML injection INJECT 45 UNITS UNDER THE SKIN TWICE A DAY 30 mL 6  . liraglutide (VICTOZA) 18 MG/3ML SOPN Inject 1.8 mg into the skin daily. 27 mL 1  . lisinopril (ZESTRIL) 2.5 MG tablet TAKE ONE TABLET BY MOUTH TWICE A DAY 180 tablet 1  . Melatonin 2.5 MG CAPS Take 5 mg by mouth at bedtime.    . metFORMIN (GLUCOPHAGE) 1000 MG tablet Take 1 tablet (1,000 mg total) by mouth 2 (two) times daily with a meal. 180 tablet 3  . Miconazole Nitrate 2 % AERO Spray between  toes every morning. (Patient taking differently: as needed. Spray between toes every morning.) 133 g 2  . mometasone-formoterol (DULERA) 200-5 MCG/ACT AERO INHALE TWO PUFFS BY MOUTH TWICE A DAY 39 g 1  . Multiple Vitamin (MULTIVITAMIN) tablet Take 1 tablet by mouth daily.    . mupirocin ointment (BACTROBAN) 2 % Apply to eft great toe and 2nd toe every evening. (Patient taking differently: as needed. Apply to eft great toe and 2nd toe every evening.) 30 g 1  . nitroGLYCERIN (NITROSTAT) 0.4 MG SL tablet DISSOLVE 1 TAB UNDER TONGUE FOR CHEST PAIN - IF PAIN REMAINS AFTER 5 MIN, CALL 911 AND REPEAT DOSE. MAX 3 TABS IN 15 MINUTES 100 tablet 2  . NOVOLOG 100 UNIT/ML injection INJECT 30 UNITS INTO THE SKIN 2 (TWO) TIMES DAILY WITH A MEAL 30 mL 11  . Omega-3 Fatty Acids (FISH OIL) 1000 MG CAPS Take 1 capsule (1,000 mg total) by mouth daily.    . prasugrel (EFFIENT) 10 MG TABS tablet Take 1 tablet (10 mg total) by mouth daily. 90 tablet 3  . traMADol (ULTRAM) 50 MG tablet TAKE ONE TABLET BY MOUTH EVERY 6 HOURS AS NEEDED 30 tablet 2   No current facility-administered medications for this visit.   Allergies  Allergen Reactions  . Shellfish Allergy Nausea And Vomiting    OYSTERS     Review of Systems: All systems reviewed and negative except where noted in HPI.   Lab Results  Component Value Date   WBC 9.8 01/29/2020   HGB 15.2 01/29/2020   HCT 46.5 01/29/2020   MCV 93.8 01/29/2020   PLT 218 01/29/2020    Lab Results  Component Value Date   CREATININE 0.72 01/29/2020   BUN 11 01/29/2020   NA 138 01/29/2020   K 4.2 01/29/2020   CL 104 01/29/2020   CO2 26 01/29/2020     Physical Exam: BP 134/64   Pulse 69   Ht _0  (1.702 m)   Wt 292 lb 6 oz (132.6 kg)   BMI 45.79  kg/m  Constitutional: Pleasant,well-developed, male in no acute distress. Neurological: Alert and oriented to person place and time. Psychiatric: Normal mood and affect. Behavior is normal.   ASSESSMENT AND  PLAN: 60 year old male here for reassessment of the following:  History of colon polyps Antiplatelet use  Patient had 11 small adenomas on colonoscopy last year.  We discussed what polyps are, risks for recurrent polyps and colon cancer moving forward.  Based on national guidelines he is due for surveillance colonoscopy at this time given the number of polyps he had on his last exam.  I discussed colonoscopy with him, as well as anesthesia, risks and benefits of that.  He describes an episode of shortness of breath after eating dinner the night of his colonoscopy.  He felt well up until that point after the procedure.  I think it would be very unlikely related to anesthesia in regards to his questions.  Not sure if he over ate at dinner or something else caused his reaction.  Recommend if he does this to make sure he eats a light meal after the procedure and monitor for symptoms.  Given his antiplatelet use he will need to hold Effient for 5 days precolonoscopy but can continue baby aspirin.  We will reach out to his cardiologist to ensure that is okay to do.  Hopefully if no significant polyp burden on this exam we can space out his next colonoscopy for 3 years.  He agreed and wishes to proceed.  Plan: - colonoscopy to be scheduled at the [facility] - will seek clearance to hold Effient for 5 days pre-procedure - reassured the patient I don't think he had a reaction to anesthesia per his report of post procedure incident as above  Cainsville Cellar, MD [facility]

## 2021-01-20 ENCOUNTER — Other Ambulatory Visit: Payer: Self-pay | Admitting: Family Medicine

## 2021-01-20 DIAGNOSIS — G8929 Other chronic pain: Secondary | ICD-10-CM

## 2021-01-20 DIAGNOSIS — M545 Low back pain, unspecified: Secondary | ICD-10-CM

## 2021-02-01 ENCOUNTER — Other Ambulatory Visit: Payer: Self-pay | Admitting: Family Medicine

## 2021-02-01 DIAGNOSIS — J449 Chronic obstructive pulmonary disease, unspecified: Secondary | ICD-10-CM

## 2021-02-03 ENCOUNTER — Encounter: Payer: Self-pay | Admitting: Podiatry

## 2021-02-03 ENCOUNTER — Other Ambulatory Visit: Payer: Self-pay

## 2021-02-03 ENCOUNTER — Ambulatory Visit (INDEPENDENT_AMBULATORY_CARE_PROVIDER_SITE_OTHER): Payer: Medicaid Other | Admitting: Podiatry

## 2021-02-03 DIAGNOSIS — Z794 Long term (current) use of insulin: Secondary | ICD-10-CM

## 2021-02-03 DIAGNOSIS — B351 Tinea unguium: Secondary | ICD-10-CM

## 2021-02-03 DIAGNOSIS — M79674 Pain in right toe(s): Secondary | ICD-10-CM

## 2021-02-03 DIAGNOSIS — M79675 Pain in left toe(s): Secondary | ICD-10-CM

## 2021-02-03 DIAGNOSIS — E119 Type 2 diabetes mellitus without complications: Secondary | ICD-10-CM

## 2021-02-07 NOTE — Progress Notes (Signed)
Subjective: [name] [name] is a pleasant 60 y.o. male patient seen for diabetic foot care today painful thick toenails that are difficult to trim. Pain interferes with ambulation. Aggravating factors include wearing enclosed shoe gear. Pain is relieved with periodic professional debridement.  PCP is Lind Covert, MD. Last visit was: 09/02/2020.  Patient states he has been applying Miconazone Spray Powder between toes once daily.   Allergies  Allergen Reactions  . Shellfish Allergy Nausea And Vomiting    OYSTERS    Objective: Physical Exam  General: [name] [name] is a pleasant 60 y.o. Caucasian male, morbidly obese in NAD. AAO x 3.   Vascular:  Capillary refill time to digits immediate b/l. Palpable pedal pulses b/l LE. Pedal hair sparse. Lower extremity skin temperature gradient within normal limits. No pain with calf compression b/l.  Dermatological:  Pedal skin with normal turgor, texture and tone bilaterally. No open wounds bilaterally. No interdigital macerations bilaterally. Toenails 1-5 b/l elongated, discolored, dystrophic, thickened, crumbly with subungual debris and tenderness to dorsal palpation.  Musculoskeletal:  Normal muscle strength 5/5 to all lower extremity muscle groups bilaterally. No pain crepitus or joint limitation noted with ROM b/l. No gross bony deformities bilaterally.  Neurological:  Protective sensation diminished with 10g monofilament b/l.  Assessment and Plan:  1. Pain due to onychomycosis of toenails of both feet   2. Type 2 diabetes mellitus without complication, with long-term current use of insulin ([place])    -Examined patient. -Continue diabetic foot care principles. -Patient to continue soft, supportive shoe gear daily. -Toenails 1-5 b/l were debrided in length and girth with sterile nail nippers and dremel without iatrogenic bleeding.  -Patient to report any pedal injuries to medical professional immediately. -Patient/POA to call should  there be question/concern in the interim.  Return in about 3 months (around 05/06/2021).  Marzetta Board, DPM

## 2021-02-14 ENCOUNTER — Other Ambulatory Visit: Payer: Self-pay | Admitting: Family Medicine

## 2021-02-14 DIAGNOSIS — Z720 Tobacco use: Secondary | ICD-10-CM

## 2021-02-17 ENCOUNTER — Other Ambulatory Visit: Payer: Self-pay

## 2021-02-17 ENCOUNTER — Ambulatory Visit (INDEPENDENT_AMBULATORY_CARE_PROVIDER_SITE_OTHER): Payer: Medicaid Other | Admitting: Family Medicine

## 2021-02-17 VITALS — BP 133/68 | HR 76 | Wt 297.2 lb

## 2021-02-17 DIAGNOSIS — E1110 Type 2 diabetes mellitus with ketoacidosis without coma: Secondary | ICD-10-CM | POA: Diagnosis not present

## 2021-02-17 DIAGNOSIS — I10 Essential (primary) hypertension: Secondary | ICD-10-CM | POA: Diagnosis not present

## 2021-02-17 DIAGNOSIS — E119 Type 2 diabetes mellitus without complications: Secondary | ICD-10-CM

## 2021-02-17 DIAGNOSIS — Z794 Long term (current) use of insulin: Secondary | ICD-10-CM

## 2021-02-17 DIAGNOSIS — Z720 Tobacco use: Secondary | ICD-10-CM

## 2021-02-17 LAB — POCT GLYCOSYLATED HEMOGLOBIN (HGB A1C): HbA1c, POC (controlled diabetic range): 7.7 % — AB (ref 0.0–7.0)

## 2021-02-17 MED ORDER — LISINOPRIL 2.5 MG PO TABS: 2.5000 mg | ORAL_TABLET | Freq: Two times a day (BID) | ORAL | 3 refills | Status: DC

## 2021-02-17 MED ORDER — VICTOZA 18 MG/3ML ~~LOC~~ SOPN
1.8000 mg | PEN_INJECTOR | Freq: Every day | SUBCUTANEOUS | 11 refills | Status: DC
Start: 2021-02-17 — End: 2021-09-15

## 2021-02-17 NOTE — Assessment & Plan Note (Signed)
Worsening.  Discussed restarting his exercise and watching his diet.  Follow up in 3 months

## 2021-02-17 NOTE — Assessment & Plan Note (Signed)
BP Readings from Last 3 Encounters:  02/17/21 133/68  01/14/21 134/64  11/24/20 140/88   At goal continue current medications

## 2021-02-17 NOTE — Patient Instructions (Addendum)
Good to see you today - Thank you for coming in  Things we discussed today:  You need an diabetes eye exam every year.  Please see your eye doctor.  Ask them to fax [place] a report of your exam  Go to the Gym more often to lose weight  Keep your colonoscopy appointment   I will call you if your tests are not good.  Otherwise, I will send you a message on MyChart (if it is active) or a letter in the mail..  If you do not hear from me with in 2 weeks please call our office.     Please always bring your medication bottles  Come back to see me in 3 month

## 2021-02-17 NOTE — Assessment & Plan Note (Signed)
Continues not to smoke.   Taking buproprion regularly

## 2021-02-17 NOTE — Progress Notes (Signed)
    SUBJECTIVE:   CHIEF COMPLAINT / HPI:   Insulin dependent type 2 diabetes mellitus ([place]) Has not been exercising as much and he feels this explains his weight gain.  No low blood sugar.  Brings in all his medications and knows the doses  Tobacco abuse Continues to not smoke.   Essential hypertension Brings in his medications.  No edema or chest pain    PERTINENT  PMH / PSH: Is moving and can't bring his dog who is living with his sister  OBJECTIVE:   BP 133/68   Pulse 76   Wt 297 lb 3.2 oz (134.8 kg)   SpO2 96%   BMI 46.55 kg/m   Conversant and engaged  ASSESSMENT/PLAN:   Essential hypertension BP Readings from Last 3 Encounters:  02/17/21 133/68  01/14/21 134/64  11/24/20 140/88   At goal continue current medications   Insulin dependent type 2 diabetes mellitus ([facility]) Worsening.  Discussed restarting his exercise and watching his diet.  Follow up in 3 months   Tobacco abuse Continues not to smoke.   Taking buproprion regularly     Lind Covert, [place]

## 2021-02-18 ENCOUNTER — Encounter: Payer: Self-pay | Admitting: Family Medicine

## 2021-02-18 LAB — CMP14+EGFR
ALT: 21 IU/L (ref 0–44)
AST: 19 IU/L (ref 0–40)
Albumin/Globulin Ratio: 1.7 (ref 1.2–2.2)
Albumin: 4.3 g/dL (ref 3.8–4.9)
Alkaline Phosphatase: 123 IU/L — ABNORMAL HIGH (ref 44–121)
BUN/Creatinine Ratio: 16 (ref 9–20)
BUN: 11 mg/dL (ref 6–24)
Bilirubin Total: 0.3 mg/dL (ref 0.0–1.2)
CO2: 24 mmol/L (ref 20–29)
Calcium: 9.3 mg/dL (ref 8.7–10.2)
Chloride: 101 mmol/L (ref 96–106)
Creatinine, Ser: 0.67 mg/dL — ABNORMAL LOW (ref 0.76–1.27)
Globulin, Total: 2.5 g/dL (ref 1.5–4.5)
Glucose: 63 mg/dL — ABNORMAL LOW (ref 65–99)
Potassium: 3.9 mmol/L (ref 3.5–5.2)
Sodium: 138 mmol/L (ref 134–144)
Total Protein: 6.8 g/dL (ref 6.0–8.5)
eGFR: 108 mL/min/{1.73_m2} (ref >59)

## 2021-02-22 ENCOUNTER — Other Ambulatory Visit: Payer: Self-pay | Admitting: Family Medicine

## 2021-02-22 DIAGNOSIS — Z794 Long term (current) use of insulin: Secondary | ICD-10-CM

## 2021-02-25 ENCOUNTER — Ambulatory Visit (AMBULATORY_SURGERY_CENTER): Payer: Medicaid Other | Admitting: Gastroenterology

## 2021-02-25 ENCOUNTER — Other Ambulatory Visit: Payer: Self-pay

## 2021-02-25 ENCOUNTER — Encounter: Payer: Self-pay | Admitting: Gastroenterology

## 2021-02-25 VITALS — BP 134/68 | HR 75 | Temp 99.3°F | Resp 14 | Ht 67.0 in | Wt 292.0 lb

## 2021-02-25 DIAGNOSIS — D125 Benign neoplasm of sigmoid colon: Secondary | ICD-10-CM

## 2021-02-25 DIAGNOSIS — Z86010 Personal history of colonic polyps: Secondary | ICD-10-CM

## 2021-02-25 DIAGNOSIS — D123 Benign neoplasm of transverse colon: Secondary | ICD-10-CM

## 2021-02-25 MED ORDER — SODIUM CHLORIDE 0.9 % IV SOLN: 500.0000 mL | Freq: Once | INTRAVENOUS | Status: DC

## 2021-02-25 NOTE — Progress Notes (Signed)
PT taken to PACU. Monitors in place. VSS. Report given to RN. 

## 2021-02-25 NOTE — Progress Notes (Signed)
Called to room to assist during endoscopic procedure.  Patient ID and intended procedure confirmed with present staff. Received instructions for my participation in the procedure from the performing physician.  

## 2021-02-25 NOTE — Op Note (Signed)
[facility] Patient Name: Aboubakar Ebbs Procedure Date: 02/25/2021 4:16 PM MRN: [id number] Endoscopist: Viviann Spare P. Adela Lank , MD Age: 60 Referring MD:  Date of Birth: 04/06/1961 Gender: Male Account #: [id number] Procedure:                Colonoscopy Indications:              High risk colon cancer surveillance: Personal                            history of colonic polyps - 11 adenomas removed                            01/2020 Medicines:                Monitored Anesthesia Care Procedure:                Pre-Anesthesia Assessment:                           - Prior to the procedure, a History and Physical                            was performed, and patient medications and                            allergies were reviewed. The patient's tolerance of                            previous anesthesia was also reviewed. The risks                            and benefits of the procedure and the sedation                            options and risks were discussed with the patient.                            All questions were answered, and informed consent                            was obtained. Prior Anticoagulants: The patient has                            taken Effient (prasugrel), last dose was 5 days                            prior to procedure. ASA Grade Assessment: III - A                            patient with severe systemic disease. After                            reviewing the risks and benefits, the patient was  deemed in satisfactory condition to undergo the                            procedure.                           After obtaining informed consent, the colonoscope                            was passed under direct vision. Throughout the                            procedure, the patient's blood pressure, pulse, and                            oxygen saturations were monitored continuously. The                            Olympus  CF-HQ190L (Serial# [id number]) Colonoscope was                            introduced through the anus and advanced to the the                            cecum, identified by appendiceal orifice and                            ileocecal valve. The colonoscopy was performed                            without difficulty. The patient tolerated the                            procedure well. The quality of the bowel                            preparation was adequate. The ileocecal valve,                            appendiceal orifice, and rectum were photographed. Scope In: 4:21:48 PM Scope Out: 4:41:10 PM Scope Withdrawal Time: 0 hours 16 minutes 30 seconds  Total Procedure Duration: 0 hours 19 minutes 22 seconds  Findings:                 The perianal and digital rectal examinations were                            normal other than some perianal irritation.                           A diminutive polyp was found in the transverse                            colon. The polyp was sessile. The polyp was removed  with a cold snare. Resection and retrieval were                            complete.                           A 3 to 4 mm polyp was found in the sigmoid colon.                            The polyp was sessile. The polyp was removed with a                            cold snare. Resection and retrieval were complete.                           Internal hemorrhoids were found during retroflexion.                           The exam was otherwise without abnormality. Complications:            No immediate complications. Estimated blood loss:                            Minimal. Estimated Blood Loss:     Estimated blood loss was minimal. Impression:               - One diminutive polyp in the transverse colon,                            removed with a cold snare. Resected and retrieved.                           - One 3 to 4 mm polyp in the sigmoid colon, removed                             with a cold snare. Resected and retrieved.                           - Internal hemorrhoids.                           - The examination was otherwise normal. Recommendation:           - Patient has a contact number available for                            emergencies. The signs and symptoms of potential                            delayed complications were discussed with the                            patient. Return to normal activities tomorrow.  Written discharge instructions were provided to the                            patient.                           - Resume previous diet.                           - Continue present medications.                           - Resume Effient tomorrow                           - Await pathology results. Anticipate repeat                            colonoscopy in 3 years given burden of polyps on                            exam last year Willaim Rayas. Atzel Mccambridge, MD 02/25/2021 4:46:12 PM This report has been signed electronically.

## 2021-02-25 NOTE — Patient Instructions (Signed)
YOU HAD AN ENDOSCOPIC PROCEDURE TODAY AT THE [facility]:   Refer to the procedure report that was given to you for any specific questions about what was found during the examination.  If the procedure report does not answer your questions, please call your gastroenterologist to clarify.  If you requested that your care partner not be given the details of your procedure findings, then the procedure report has been included in a sealed envelope for you to review at your convenience later.  YOU SHOULD EXPECT: Some feelings of bloating in the abdomen. Passage of more gas than usual.  Walking can help get rid of the air that was put into your GI tract during the procedure and reduce the bloating. If you had a lower endoscopy (such as a colonoscopy or flexible sigmoidoscopy) you may notice spotting of blood in your stool or on the toilet paper. If you underwent a bowel prep for your procedure, you may not have a normal bowel movement for a few days.  Please Note:  You might notice some irritation and congestion in your nose or some drainage.  This is from the oxygen used during your procedure.  There is no need for concern and it should clear up in a day or so.  SYMPTOMS TO REPORT IMMEDIATELY:   Following lower endoscopy (colonoscopy or flexible sigmoidoscopy):  Excessive amounts of blood in the stool  Significant tenderness or worsening of abdominal pains  Swelling of the abdomen that is new, acute  Fever of 100F or higher   Following upper endoscopy (EGD)  Vomiting of blood or coffee ground material  New chest pain or pain under the shoulder blades  Painful or persistently difficult swallowing  New shortness of breath  Fever of 100F or higher  Black, tarry-looking stools  For urgent or emergent issues, a gastroenterologist can be reached at any hour by calling [phone]. Do not use MyChart messaging for urgent concerns.    DIET:  We do recommend a small meal at first, but  then you may proceed to your regular diet.  Drink plenty of fluids but you should avoid alcoholic beverages for 24 hours.  ACTIVITY:  You should plan to take it easy for the rest of today and you should NOT DRIVE or use heavy machinery until tomorrow (because of the sedation medicines used during the test).    FOLLOW UP: Our staff will call the number listed on your records 48-72 hours following your procedure to check on you and address any questions or concerns that you may have regarding the information given to you following your procedure. If we do not reach you, we will leave a message.  We will attempt to reach you two times.  During this call, we will ask if you have developed any symptoms of COVID 19. If you develop any symptoms (ie: fever, flu-like symptoms, shortness of breath, cough etc.) before then, please call [phone].  If you test positive for Covid 19 in the 2 weeks post procedure, please call and report this information to us.    If any biopsies were taken you will be contacted by phone or by letter within the next 1-3 weeks.  Please call us at [phone] if you have not heard about the biopsies in 3 weeks.    SIGNATURES/CONFIDENTIALITY: You and/or your care partner have signed paperwork which will be entered into your electronic medical record.  These signatures attest to the fact that that the information above on   your After Visit Summary has been reviewed and is understood.  Full responsibility of the confidentiality of this discharge information lies with you and/or your care-partner. 

## 2021-03-01 ENCOUNTER — Telehealth: Payer: Self-pay | Admitting: *Deleted

## 2021-03-01 NOTE — Telephone Encounter (Signed)
Left message on f/u call 

## 2021-03-01 NOTE — Telephone Encounter (Signed)
  Follow up Call-  Call back number 02/25/2021 01/29/2020  Post procedure Call Back phone  # [phone]  Permission to leave phone message Yes Yes  Some recent data might be hidden     No answer at 2nd attempt follow up phone call.  Left message on voicemail.

## 2021-03-01 NOTE — Telephone Encounter (Signed)
Patient returned the call and said he is well no questions at this time.

## 2021-03-05 ENCOUNTER — Encounter: Payer: Self-pay | Admitting: Gastroenterology

## 2021-03-11 ENCOUNTER — Other Ambulatory Visit: Payer: Self-pay

## 2021-03-12 MED ORDER — BD PEN NEEDLE ORIGINAL U/F 29G X 12.7MM MISC: 6 refills | Status: DC

## 2021-04-05 ENCOUNTER — Telehealth: Payer: Self-pay

## 2021-04-05 NOTE — Telephone Encounter (Signed)
Patient needs to sign and complete release.  Reviewed, completed, and signed form.  Note routed to RN team inbasket and placed completed form in Clinic RN's office (wall pocket above desk).  Martyn Malay, MD

## 2021-04-05 NOTE — Telephone Encounter (Signed)
Called patient to clarify need for form. Patient has chronic back pain and cardiac disease limiting walking distance.  Will complete form with this information  [name] Singh, MD  [facility] Medicine Teaching Service

## 2021-04-05 NOTE — Telephone Encounter (Signed)
Reviewed form and placed in PCP's box for completion.  .Isador Castille R Cortne Amara, CMA  

## 2021-04-05 NOTE — Telephone Encounter (Signed)
Access [place]  form dropped off for at front desk for completion.  Verified that patient section of form has been completed.  Last DOS/WCC with PCP was 02/17/2021.  Placed form in team folder to be completed by clinical staff.  [name] [name]   Pt has also requested after the doctor has completed the form to mail him a copy of it please.

## 2021-04-05 NOTE — Progress Notes (Signed)
04/06/2021 [name]   [birth year]  [id number]  Primary Physician Carney Living, MD Primary Cardiologist: [name]  HPI:  Jeshawn is seen today for follow up CAD. His past medical history is significant for CAD, DM, obesity, tobacco use , hyperlipidemia and hypertension. He  is status post stenting of the right coronary artery in May of 2013. He was admitted with a NSTEMI in February of 2015. He was found to have severe disease in LCx and OM2 treated with BMS.  He was placed on dual antiplatelet therapy with aspirin plus Effient.   He presented with increased angina in October 2015. A Myoview study was intermediate risk.  He had repeat cardiac cath in November 2015.  He was found to have severe single-vessel disease involving the bifurcation of OM1 and OM 2 with 99% in-stent restenosis in the bare-metal stent placed in the OM 2. He underwent successful PCI spanning the proximal circumflex stent across OM 1 through the bare-metal stent placed in OM 2 with a Xience alpine DES stent. He was also noted to have a widely patent stent in the RCA and proximal circumflex with otherwise mild-moderate disease. He has preserved LVEF with an ejection fraction of 55-65%. He was instructed to continue with DAPT with ASA + Effient.   On follow up today he reports he is doing well. He denies any chest pain or SOB. He did quit smoking at the new year. He uses Nicorette gum about 3 a day.  Weight is stable.  Still exercises 2-3 times a week. Now living in a town home with his support dog.    Current Outpatient Medications  Medication Sig Dispense Refill   ACCU-CHEK FASTCLIX LANCETS MISC 1 Units by Percutaneous route 4 (four) times daily. 100 each 12   albuterol (PROAIR HFA) 108 (90 Base) MCG/ACT inhaler Inhale 2 puffs into the lungs every 6 (six) hours as needed for wheezing or shortness of breath. 18 g 2   aspirin 81 MG chewable tablet Chew 1 tablet (81 mg total) by mouth daily. 180 tablet 2    atorvastatin (LIPITOR) 80 MG tablet Take 1 tablet (80 mg total) by mouth daily. 90 tablet 1   BD INSULIN SYRINGE U/F 31G X 5/16" 1 ML MISC USE UP TO FIVE TIMES A DAY WITH NOVOLOG AND LANTUS DOSING 100 each 1   Blood Glucose Monitoring Suppl (ACCU-CHEK GUIDE) w/Device KIT Check blood sugar once each morning 1 kit 0   buPROPion (WELLBUTRIN XL) 150 MG 24 hr tablet TAKE ONE TABLET BY MOUTH DAILY 90 tablet 2   carvedilol (COREG) 3.125 MG tablet Take 1 tablet (3.125 mg total) by mouth 2 (two) times daily with a meal. 180 tablet 3   citalopram (CELEXA) 20 MG tablet Take 1 tablet (20 mg total) by mouth daily. 90 tablet 1   DULERA 200-5 MCG/ACT AERO INHALE TWO PUFFS BY MOUTH TWICE A DAY 39 g 1   glucose blood (ACCU-CHEK AVIVA PLUS) test strip Use as instructed 100 strip 3   glucose blood (ACCU-CHEK GUIDE) test strip Use as instructed 100 each 4   Insulin Pen Needle (BD ULTRA-FINE PEN NEEDLES) 29G X 12.7MM MISC USE TO WITH VICTOZA PEN, INJECTION ONCE DAILY. 100 each 6   JARDIANCE 25 MG TABS tablet TAKE ONE TABLET BY MOUTH DAILY 90 tablet 3   ketoconazole (NIZORAL) 2 % cream Apply 1 application topically daily. 15 g 1   LANTUS 100 UNIT/ML injection INJECT 45 UNITS UNDER THE SKIN TWICE A  DAY 30 mL 6   liraglutide (VICTOZA) 18 MG/3ML SOPN Inject 1.8 mg into the skin daily. 27 mL 11   lisinopril (ZESTRIL) 2.5 MG tablet Take 1 tablet (2.5 mg total) by mouth 2 (two) times daily. 180 tablet 3   Melatonin 2.5 MG CAPS Take 5 mg by mouth at bedtime.     metFORMIN (GLUCOPHAGE) 1000 MG tablet Take 1 tablet (1,000 mg total) by mouth 2 (two) times daily with a meal. 180 tablet 3   Miconazole Nitrate 2 % AERO Spray between toes every morning. 133 g 2   Multiple Vitamin (MULTIVITAMIN) tablet Take 1 tablet by mouth daily.     mupirocin ointment (BACTROBAN) 2 % Apply to eft great toe and 2nd toe every evening. 30 g 1   nitroGLYCERIN (NITROSTAT) 0.4 MG SL tablet DISSOLVE 1 TAB UNDER TONGUE FOR CHEST PAIN - IF PAIN REMAINS  AFTER 5 MIN, CALL 911 AND REPEAT DOSE. MAX 3 TABS IN 15 MINUTES 100 tablet 2   NOVOLOG 100 UNIT/ML injection INJECT 30 UNITS INTO THE SKIN 2 (TWO) TIMES DAILY WITH A MEAL 30 mL 11   Omega-3 Fatty Acids (FISH OIL) 1000 MG CAPS Take 1 capsule (1,000 mg total) by mouth daily.     traMADol (ULTRAM) 50 MG tablet TAKE ONE TABLET BY MOUTH EVERY 6 HOURS AS NEEDED 30 tablet 2   prasugrel (EFFIENT) 10 MG TABS tablet Take 1 tablet (10 mg total) by mouth daily. 90 tablet 3   No current facility-administered medications for this visit.    Allergies  Allergen Reactions   Shellfish Allergy Nausea And Vomiting    OYSTERS    Social History   Socioeconomic History   Marital status: Single    Spouse name: Not on file   Number of children: 0   Years of education: 15   Highest education level: Not on file  Occupational History   Occupation: Unemployed    Comment: Used to work for [employer]   Occupation: Consulting civil engineer, Insurance underwriter [employer]  Tobacco Use   Smoking status: Former    Packs/day: 1.00    Years: 42.00    Pack years: 42.00    Types: Cigarettes    Start date: 09/13/1975    Quit date: 01/25/2018    Years since quitting: 3.1   Smokeless tobacco: Former    Types: Chew   Tobacco comments:    Bupropion helping  Vaping Use   Vaping Use: Never used  Substance and Sexual Activity   Alcohol use: Not Currently    Alcohol/week: 6.0 standard drinks    Types: 6 Cans of beer per week    Comment: pt reports "I quit 66 days ago" (05/09/16)   Drug use: No    Comment: Crack Cocaine (denies 05/09/16)   Sexual activity: Not Currently  Other Topics Concern   Not on file  Social History Narrative   Single.  Lives with a roommate.  Ambulates independently.   Social Determinants of Health   Financial Resource Strain: Not on file  Food Insecurity: Not on file  Transportation Needs: Not on file  Physical Activity: Not on file  Stress: Not on file  Social Connections: Not on file  Intimate  Partner Violence: Not on file     Review of Systems: AS noted in HPI All other systems reviewed and are otherwise negative except as noted above.  Blood pressure 118/60, pulse 70, height 5\' 7"  (1.702 m), weight 290 lb (131.5 kg), SpO2 97 %.  GENERAL:  Well appearing obese WM in NAD HEENT:  PERRL, EOMI, sclera are clear. Oropharynx is clear. NECK:  No jugular venous distention, carotid upstroke brisk and symmetric, no bruits, no thyromegaly or adenopathy LUNGS:  Clear to auscultation bilaterally CHEST:  Unremarkable HEART:  RRR,  PMI not displaced or sustained,S1 and S2 within normal limits, no S3, no S4: no clicks, no rubs, no murmurs ABD:  Soft, nontender. BS +, no masses or bruits. No hepatomegaly, no splenomegaly EXT:  2 + pulses throughout, no edema, no cyanosis no clubbing SKIN:  Warm and dry.  No rashes NEURO:  Alert and oriented x 3. Cranial nerves II through XII intact. PSYCH:  Cognitively intact      Laboratory data:  Lab Results  Component Value Date   WBC 9.8 01/29/2020   HGB 15.2 01/29/2020   HCT 46.5 01/29/2020   PLT 218 01/29/2020   GLUCOSE 63 (L) 02/17/2021   CHOL 94 (L) 05/09/2019   TRIG 143 05/09/2019   HDL 33 (L) 05/09/2019   LDLDIRECT 70 09/02/2020   LDLCALC 32 05/09/2019   ALT 21 02/17/2021   AST 19 02/17/2021   NA 138 02/17/2021   K 3.9 02/17/2021   CL 101 02/17/2021   CREATININE 0.67 (L) 02/17/2021   BUN 11 02/17/2021   CO2 24 02/17/2021   TSH 1.100 01/18/2012   INR 0.96 07/28/2014   HGBA1C 7.7 (A) 02/17/2021   MICROALBUR 4.7 05/23/2016    Ecg today shows NSR with rate 70. Normal I have personally reviewed and interpreted this study.   ASSESSMENT AND PLAN:   1. CAD: Status post repeat PCI to the proximal circumflex and OM 2 in Nov. 2015 as outlined above with DES for instent restenosis. He is asymptomatic.  Continue dual antiplatelet therapy with aspirin plus Effient indefinitely due to extensive nature of stents.  Continue beta blocker,  statin and ACE inhibitor for secondary prevention.  2. Hypertension: Blood pressure is well-controlled. Continue lisinopril and Coreg  3. Hyperlipidemia: on statin. LDL 70  4. Diabetes: per primary care. Encourage weight loss and continued physical exercise. A1c 7.7%  5. Tobacco abuse: patient has quit. Congratulated.   I will follow up in 6 months   Racquel Arkin [place] MD,FACC  04/06/2021 3:14 PM

## 2021-04-05 NOTE — Telephone Encounter (Signed)
Patient called stating that he received a call from [place] around 10:30 and was calling back.  I informed him that I didn't see any missed calls but I would make a note in case someone did call and hasn't documented it yet.  Celia Gibbons,CMA

## 2021-04-06 ENCOUNTER — Other Ambulatory Visit: Payer: Self-pay

## 2021-04-06 ENCOUNTER — Ambulatory Visit (INDEPENDENT_AMBULATORY_CARE_PROVIDER_SITE_OTHER): Payer: Medicaid Other | Admitting: Cardiology

## 2021-04-06 ENCOUNTER — Encounter: Payer: Self-pay | Admitting: Cardiology

## 2021-04-06 VITALS — BP 118/60 | HR 70 | Ht 67.0 in | Wt 290.0 lb

## 2021-04-06 DIAGNOSIS — G4733 Obstructive sleep apnea (adult) (pediatric): Secondary | ICD-10-CM | POA: Diagnosis not present

## 2021-04-06 DIAGNOSIS — Z720 Tobacco use: Secondary | ICD-10-CM | POA: Diagnosis not present

## 2021-04-06 DIAGNOSIS — Z9989 Dependence on other enabling machines and devices: Secondary | ICD-10-CM

## 2021-04-06 DIAGNOSIS — I10 Essential (primary) hypertension: Secondary | ICD-10-CM

## 2021-04-06 DIAGNOSIS — E782 Mixed hyperlipidemia: Secondary | ICD-10-CM

## 2021-04-06 DIAGNOSIS — Z9861 Coronary angioplasty status: Secondary | ICD-10-CM | POA: Diagnosis not present

## 2021-04-06 DIAGNOSIS — I2510 Atherosclerotic heart disease of native coronary artery without angina pectoris: Secondary | ICD-10-CM | POA: Diagnosis not present

## 2021-04-06 MED ORDER — PRASUGREL HCL 10 MG PO TABS: 10.0000 mg | ORAL_TABLET | Freq: Every day | ORAL | 3 refills | Status: DC

## 2021-04-07 NOTE — Telephone Encounter (Signed)
Form mailed to patient per request.  Copy made for batch scanning.

## 2021-04-20 LAB — HM DIABETES EYE EXAM

## 2021-05-06 ENCOUNTER — Other Ambulatory Visit: Payer: Self-pay | Admitting: Family Medicine

## 2021-05-07 ENCOUNTER — Other Ambulatory Visit: Payer: Self-pay

## 2021-05-07 ENCOUNTER — Ambulatory Visit (INDEPENDENT_AMBULATORY_CARE_PROVIDER_SITE_OTHER): Payer: Medicaid Other | Admitting: Podiatry

## 2021-05-07 DIAGNOSIS — W57XXXA Bitten or stung by nonvenomous insect and other nonvenomous arthropods, initial encounter: Secondary | ICD-10-CM

## 2021-05-07 DIAGNOSIS — M79675 Pain in left toe(s): Secondary | ICD-10-CM

## 2021-05-07 DIAGNOSIS — S90869A Insect bite (nonvenomous), unspecified foot, initial encounter: Secondary | ICD-10-CM

## 2021-05-07 DIAGNOSIS — S90862S Insect bite (nonvenomous), left foot, sequela: Secondary | ICD-10-CM

## 2021-05-07 DIAGNOSIS — B351 Tinea unguium: Secondary | ICD-10-CM | POA: Diagnosis not present

## 2021-05-07 DIAGNOSIS — M79674 Pain in right toe(s): Secondary | ICD-10-CM | POA: Diagnosis not present

## 2021-05-09 ENCOUNTER — Encounter (HOSPITAL_COMMUNITY): Payer: Self-pay

## 2021-05-09 ENCOUNTER — Emergency Department (HOSPITAL_COMMUNITY): Payer: Medicaid Other

## 2021-05-09 ENCOUNTER — Other Ambulatory Visit: Payer: Self-pay

## 2021-05-09 ENCOUNTER — Inpatient Hospital Stay (HOSPITAL_COMMUNITY)
Admission: EM | Admit: 2021-05-09 | Discharge: 2021-05-14 | DRG: 190 | Disposition: A | Discharge status: Home / Self Care | Payer: Medicaid Other | Attending: Family Medicine | Admitting: Family Medicine

## 2021-05-09 DIAGNOSIS — Z87891 Personal history of nicotine dependence: Secondary | ICD-10-CM

## 2021-05-09 DIAGNOSIS — Z8249 Family history of ischemic heart disease and other diseases of the circulatory system: Secondary | ICD-10-CM | POA: Diagnosis not present

## 2021-05-09 DIAGNOSIS — Z91013 Allergy to seafood: Secondary | ICD-10-CM | POA: Diagnosis not present

## 2021-05-09 DIAGNOSIS — M549 Dorsalgia, unspecified: Secondary | ICD-10-CM | POA: Diagnosis present

## 2021-05-09 DIAGNOSIS — J440 Chronic obstructive pulmonary disease with acute lower respiratory infection: Secondary | ICD-10-CM | POA: Diagnosis present

## 2021-05-09 DIAGNOSIS — M1990 Unspecified osteoarthritis, unspecified site: Secondary | ICD-10-CM | POA: Diagnosis present

## 2021-05-09 DIAGNOSIS — J441 Chronic obstructive pulmonary disease with (acute) exacerbation: Principal | ICD-10-CM | POA: Diagnosis present

## 2021-05-09 DIAGNOSIS — Z955 Presence of coronary angioplasty implant and graft: Secondary | ICD-10-CM

## 2021-05-09 DIAGNOSIS — R0902 Hypoxemia: Secondary | ICD-10-CM

## 2021-05-09 DIAGNOSIS — G4733 Obstructive sleep apnea (adult) (pediatric): Secondary | ICD-10-CM | POA: Diagnosis present

## 2021-05-09 DIAGNOSIS — E119 Type 2 diabetes mellitus without complications: Secondary | ICD-10-CM

## 2021-05-09 DIAGNOSIS — G8929 Other chronic pain: Secondary | ICD-10-CM | POA: Diagnosis present

## 2021-05-09 DIAGNOSIS — E785 Hyperlipidemia, unspecified: Secondary | ICD-10-CM | POA: Diagnosis present

## 2021-05-09 DIAGNOSIS — Z833 Family history of diabetes mellitus: Secondary | ICD-10-CM | POA: Diagnosis not present

## 2021-05-09 DIAGNOSIS — Z803 Family history of malignant neoplasm of breast: Secondary | ICD-10-CM

## 2021-05-09 DIAGNOSIS — J9601 Acute respiratory failure with hypoxia: Secondary | ICD-10-CM | POA: Diagnosis present

## 2021-05-09 DIAGNOSIS — E118 Type 2 diabetes mellitus with unspecified complications: Secondary | ICD-10-CM

## 2021-05-09 DIAGNOSIS — Z823 Family history of stroke: Secondary | ICD-10-CM

## 2021-05-09 DIAGNOSIS — I252 Old myocardial infarction: Secondary | ICD-10-CM | POA: Diagnosis not present

## 2021-05-09 DIAGNOSIS — Z20822 Contact with and (suspected) exposure to covid-19: Secondary | ICD-10-CM | POA: Diagnosis present

## 2021-05-09 DIAGNOSIS — K219 Gastro-esophageal reflux disease without esophagitis: Secondary | ICD-10-CM | POA: Diagnosis present

## 2021-05-09 DIAGNOSIS — E1165 Type 2 diabetes mellitus with hyperglycemia: Secondary | ICD-10-CM

## 2021-05-09 DIAGNOSIS — Z7982 Long term (current) use of aspirin: Secondary | ICD-10-CM

## 2021-05-09 DIAGNOSIS — I10 Essential (primary) hypertension: Secondary | ICD-10-CM | POA: Diagnosis present

## 2021-05-09 DIAGNOSIS — Z6841 Body Mass Index (BMI) 40.0 and over, adult: Secondary | ICD-10-CM

## 2021-05-09 DIAGNOSIS — Z83438 Family history of other disorder of lipoprotein metabolism and other lipidemia: Secondary | ICD-10-CM

## 2021-05-09 DIAGNOSIS — F32A Depression, unspecified: Secondary | ICD-10-CM | POA: Diagnosis present

## 2021-05-09 DIAGNOSIS — Z794 Long term (current) use of insulin: Secondary | ICD-10-CM

## 2021-05-09 DIAGNOSIS — E113293 Type 2 diabetes mellitus with mild nonproliferative diabetic retinopathy without macular edema, bilateral: Secondary | ICD-10-CM | POA: Diagnosis present

## 2021-05-09 DIAGNOSIS — J189 Pneumonia, unspecified organism: Secondary | ICD-10-CM

## 2021-05-09 DIAGNOSIS — J449 Chronic obstructive pulmonary disease, unspecified: Secondary | ICD-10-CM | POA: Diagnosis present

## 2021-05-09 DIAGNOSIS — I2510 Atherosclerotic heart disease of native coronary artery without angina pectoris: Secondary | ICD-10-CM | POA: Diagnosis present

## 2021-05-09 DIAGNOSIS — Z8719 Personal history of other diseases of the digestive system: Secondary | ICD-10-CM | POA: Diagnosis not present

## 2021-05-09 DIAGNOSIS — R0602 Shortness of breath: Secondary | ICD-10-CM | POA: Diagnosis not present

## 2021-05-09 DIAGNOSIS — F419 Anxiety disorder, unspecified: Secondary | ICD-10-CM | POA: Diagnosis present

## 2021-05-09 DIAGNOSIS — Z79899 Other long term (current) drug therapy: Secondary | ICD-10-CM

## 2021-05-09 LAB — CBC
HCT: 49.7 % (ref 39.0–52.0)
Hemoglobin: 15.9 g/dL (ref 13.0–17.0)
MCH: 29 pg (ref 26.0–34.0)
MCHC: 32 g/dL (ref 30.0–36.0)
MCV: 90.7 fL (ref 80.0–100.0)
Platelets: 261 10*3/uL (ref 150–400)
RBC: 5.48 MIL/uL (ref 4.22–5.81)
RDW: 13.7 % (ref 11.5–15.5)
WBC: 13.5 10*3/uL — ABNORMAL HIGH (ref 4.0–10.5)
nRBC: 0 % (ref 0.0–0.2)

## 2021-05-09 LAB — RESP PANEL BY RT-PCR (FLU A&B, COVID) ARPGX2
Influenza A by PCR: NEGATIVE
Influenza B by PCR: NEGATIVE
SARS Coronavirus 2 by RT PCR: NEGATIVE

## 2021-05-09 LAB — BASIC METABOLIC PANEL
Anion gap: 9 (ref 5–15)
BUN: 10 mg/dL (ref 6–20)
CO2: 24 mmol/L (ref 22–32)
Calcium: 8.9 mg/dL (ref 8.9–10.3)
Chloride: 103 mmol/L (ref 98–111)
Creatinine, Ser: 0.68 mg/dL (ref 0.61–1.24)
GFR, Estimated: >60 mL/min (ref >60)
Glucose, Bld: 159 mg/dL — ABNORMAL HIGH (ref 70–99)
Potassium: 4 mmol/L (ref 3.5–5.1)
Sodium: 136 mmol/L (ref 135–145)

## 2021-05-09 LAB — TROPONIN I (HIGH SENSITIVITY): Troponin I (High Sensitivity): 8 ng/L (ref <18)

## 2021-05-09 MED ORDER — ALBUTEROL SULFATE (2.5 MG/3ML) 0.083% IN NEBU
5.0000 mg | INHALATION_SOLUTION | Freq: Once | RESPIRATORY_TRACT | Status: AC
  Administered 2021-05-09: 5 mg via RESPIRATORY_TRACT
  Filled 2021-05-09: qty 6

## 2021-05-09 MED ORDER — FENTANYL CITRATE PF 50 MCG/ML IJ SOSY
50.0000 ug | PREFILLED_SYRINGE | Freq: Once | INTRAMUSCULAR | Status: AC
  Administered 2021-05-09: 50 ug via INTRAVENOUS
  Filled 2021-05-09: qty 1

## 2021-05-09 NOTE — H&P (Addendum)
[facility] Admission History and Physical Service Pager: [phone]  Patient name: [name] [name] Medical record number: [id number] Date of birth: 07-07-1961 Age: 60 y.o. Gender: male  Primary Care Provider: Lind Covert, MD Consultants: None Code Status: FULL CODE  Preferred Emergency Contact:  Contact Information     Name Relation Home Work [name] Sister [phone]  [phone]   [name],[name] Mother   [phone]   [name],[name] Relative   [phone]        Chief Complaint: Shortness of breath, cough with sputum  Assessment and Plan: [name] [name] is a 60 y.o. male presenting with dyspnea. PMH is significant for COPD, HLD, HTN, CAD, depression, T2DM, CAD with history of NSTEMI, history of tobacco use disorder  Acute hypoxic respiratory failure secondary to COPD exacerbation [name] [name] is a 60 yr old male who presents with dyspnea, cough with sputum production, rhinorrhea, sore throat for the past 4 days with with subjective fevers and chills since yesterday, with worsening of symptoms this morning. Took NyQuil, cough drops, home albuterol and CPAP at home without any relief. Also had 3 episodes of emesis, likely posttussive.  No chest pain.  He has diagnosis of COPD, 40-pack-year smoking history. Last PFTs on 03/29/2018 with [name] [name]. Arrived via EMS, with SPO2 on EMS arrival noted to be 80% with RR 28, s/p albuterol x1, Atrovent, magnesium, Solu-Medrol, and CPAP placement. Patient hypoxic to the 80s upon arrival to the ED, started on BiPAP with improvement of hypoxia with oxygen greater than 88%.  After trial off BiPAP, oxygen saturations dropped to 83 to 84% with patient complaining of dyspnea, increasing distress.  Patient placed back on BiPAP on previous settings, 50% FiO2.  Patient says he feels more comfortable with the BiPAP on this evening.  Labs remarkable: WBC 13.5, no electrolyte abnormalities, glucose 159, creatinine 0.68,  troponin 8 and 12, COVID and flu A&B negative.  Chest x-ray unremarkable with impression of no active disease, but upon my inspection of the chest x-ray the right lower and middle lung fields appear to have increased haziness.  Considering patient's COPD diagnosis, the etiology of his dyspnea is likely an acute COPD exacerbation.  Unlikely that he is having an acute MI given troponins flat, no reported chest pain, EKG with normal sinus rhythm and no concerning ST or T wave changes. This is also unlikely to be a pulmonary embolism, given he has no pleuritic chest pain, tachycardia, hemoptysis and his calculated Wells Score is 0.  Dyspnea less likely to be an acute exacerbation of heart failure, as he does not appear fluid overloaded with no JVD, pulmonary crackles, lower extremity edema, but he does have chronic issues with having to stop and rest due to exertional dyspnea. There is likely an infectious component along with COPD exacerbation as he has an elevated WBC count, and has had systemic symptoms of acute infection of subjective fevers and chills and increasing sputum production along with his dyspnea, so we will start antibiotics. Patient does not meet SIRS criteria, with low concern for acute sepsis at this time, so blood cultures were not obtained. -Admit to progressive, attending [name]. Dorris [name] -Carb modified diet  -VS per floor routine  -Cardiac monitoring -Pulse oximetry -BiPAP per RT, wean as able  -AM CBC, BMP -F/u BNP -Wean oxygen as tolerated, goal O2 saturations greater than 88% -IV Solu-Medrol 60 mg every 6 hours, switch to PO prednisone as bale  -Duo nebs scheduled every 6 hours -Dulera 2  puffs twice daily -Azithromycin 500 mg once daily (start 8/29) -PT/OT eval and treat -Lovenox for DVT ppx   Insulin dependent T2DM Last A1c 02/17/2021: 7.7. CBG 159 on arrival.  Home meds: Lantus 45 units BID, Jardiance 25m, novolog 30U BID , metformin 10075mBID, Victoza 1.76m17mnjection once  weekly (last dose unknown) -Resistant SSI  -Semglee 20 units 2 times daily, increase as necessary  -Hold Metformin, Victoza and Jardiance  -CBG 4 times daily, before meals and at bedtime  CAD, NSTEMI s/p PCI stent x3 Pt denies chest pain but does endorse recent hx of chest tightness with recent illness. Low suspicion for ACS. Symptoms likely related to COPD exacebation. Trop 8>12. EKG: normal sinus rhythm.  Extensive cardiac history. RCA stent in May 2013.  NSTEMI in 2015, stent in left circumflex.  Repeat angina in October 2015, repeat cath with stent x2 placement.  Has preserved LVEF with EF of 55 to 65%.  Was instructed to continue with DAPT with ASA plus Effient. Follows [name]. Peter [name] 6 months, last visit 04/06/2021.  Home meds: Coreg 3.125m53mD, aspirin, 81mg74mly, Effient 10mg 39my  -Continue DAPT due to extensive cardiac history -Cardiac monitoring -Consider repeat echocardiogram as last echocardiogram was in 2013 -Pending BNP -Trend troponins  HTN  BP 131/69 Home meds: Lisinopril 2.5 mg twice daily -Continue home meds -Monitor BP  HLD Last LDL 70 on 09/02/20, at goal. Home meds: Atorvastatin 40 mg daily -Continue home meds  Back pain Chronic, stable.  Home meds: Tramadol 50mg Q776mPDMP reviewed, last refill was 04/04/21 for 30 x 50mg ta15ms. Pt reports he has not needed to take Tramadol in several weeks. -Can give Tylenol if needed for back pain  H/O Tobacco use disorder 40-year pack history.  Quit smoking cigarettes 1 year ago. [name] Wellbutrin 150mg dai75mContinue home meds  Depression  Chronic, stable. Home meds: Celexa 20 mg daily -Continue home meds  OSA Pt uses CPAP at night. -Continue Bipap for now given COPD exacerbation -Switch to CPAP when more stable   FEN/GI: Carb modified Prophylaxis: Lovenox  Disposition: progressive   History of Present Illness:  [name] [name] [name].o. m77e presenting with shortness of  breath and cough for 4 days with worsening dyspnea since yesterday.   Started having subjective fevers and chills since yesterday afternoon, but did not take a temperature at home. He used his inhalers at home. He has been coughing up dark sputum. It got a little better yesterday but got worse today. He had 3 episodes of vomiting which was possibly post tussive. Pt also reports chest tightness, rhinorrhea, sore throat etc. He is able to walk a few blocks before he gets breathless. Uses 3 pillows at night. No new peripheral edema. No hx of COPD hospitalizations or intubations.   Hx of tobacco use. Smoked for 40 years, 10-20 a [name]. Quit smoking a year ago. Denies EOTH intake or illicit drug use. Lives with sister. Does not have HH aids. Aguangadependent.   He manages his medications himself. Took his medications today.Covid vaccinations x 3.  Review Of Systems: Per HPI with the following additions:   Review of Systems  Constitutional:  Positive for chills and fever.  HENT:  Positive for rhinorrhea and sore throat.   Respiratory:  Positive for cough, chest tightness and shortness of breath.   Cardiovascular:  Negative for chest pain.  Gastrointestinal:  Positive for nausea and vomiting. Negative for abdominal pain, constipation  and diarrhea.    Patient Active Problem List   Diagnosis Date Noted   Colon polyps 02/18/2020   Antiplatelet or antithrombotic long-term use 12/23/2019   Mild nonproliferative diabetic retinopathy of both eyes without macular edema associated with type 2 diabetes mellitus ([place]) 04/30/2019   Depression, acute 02/17/2016   Hearing difficulty 12/10/2014   COPD, moderate ([place]) 11/22/2014   Angina, class II ([place]) 08/01/2014   H/O NSTEMI (non-ST elevated myocardial infarction) 11/07/2013   Tobacco abuse 08/24/2012   Obesity 07/31/2012   Insulin dependent type 2 diabetes mellitus ([place]) 06/05/2012   CAD S/P percutaneous coronary angioplasty 01/21/2012   OSA on CPAP 01/19/2012    Essential hypertension 01/18/2012   Hyperlipidemia with target LDL less than 70 01/18/2012    Past Medical History: Past Medical History:  Diagnosis Date   Alcoholism ([place])    Anxiety    Arthritis    "qwhere"   Asthma    CAD S/P percutaneous coronary angioplasty 01/18/12; 10/2013   a. pRCA 3.5 x 18 vision BMS - 4.2 mm; b. 2/'15: mCx 3.5 x 12  Rebel BMS (3.6-3.7 mm)   Chronic back pain    "mid/lower" (08/01/2014)   COPD (chronic obstructive pulmonary disease) ([place])    "I'm seeing COPD [name] now; don't know if I've got it" (08/01/2014)   Depression    GERD (gastroesophageal reflux disease)    Hyperlipidemia    Hypertension    Migraines    "once in awhile" (08/01/2014)   Non-ST elevation myocardial infarction (NSTEMI) ([place]) 10/2013   OSA on CPAP 01/19/2012   Sleep apnea    Type II diabetes mellitus ([place])     Past Surgical History: Past Surgical History:  Procedure Laterality Date   CARDIAC CATHETERIZATION  07/2014   Left Main: Short, large-caliber vessel. Widely patent. Bifurcates into the LAD and Circumflex. Angiographically normal.   CORONARY ANGIOPLASTY WITH STENT PLACEMENT  01/2012; 11/07/2013; 08/01/2014   "1 + 2 + 1"    CORONARY ANGIOPLASTY WITH STENT PLACEMENT  07/2014   Severe single-vessel disease involving the bifurcation of OM1 and OM 2 with 99% in-stent restenosis in the bare-metal stent placed in the OM 2.   LEFT HEART CATHETERIZATION WITH CORONARY ANGIOGRAM N/A 01/20/2012   Procedure: LEFT HEART CATHETERIZATION WITH CORONARY ANGIOGRAM;  Surgeon: Jettie Booze, MD;  Location: [facility] CATH LAB;  Service: Cardiovascular;  Laterality: N/A;   LEFT HEART CATHETERIZATION WITH CORONARY ANGIOGRAM N/A 11/07/2013   Procedure: LEFT HEART CATHETERIZATION WITH CORONARY ANGIOGRAM;  Surgeon: Jettie Booze, MD;  Location: [facility] CATH LAB;  Service: Cardiovascular;  Laterality: N/A;   LEFT HEART CATHETERIZATION WITH CORONARY ANGIOGRAM N/A 08/01/2014   Procedure: LEFT HEART  CATHETERIZATION WITH CORONARY ANGIOGRAM;  Surgeon: Leonie Man, MD;  Location: [facility] CATH LAB;  Service: Cardiovascular;  Laterality: N/A;   MULTIPLE TOOTH EXTRACTIONS     PERCUTANEOUS CORONARY STENT INTERVENTION (PCI-S)  01/20/2012   Procedure: PERCUTANEOUS CORONARY STENT INTERVENTION (PCI-S);  Surgeon: Jettie Booze, MD;  Location: [facility] CATH LAB;  Service: Cardiovascular;;   PERCUTANEOUS CORONARY STENT INTERVENTION (PCI-S)  11/07/2013   Procedure: PERCUTANEOUS CORONARY STENT INTERVENTION (PCI-S);  Surgeon: Jettie Booze, MD;  Location: [facility] CATH LAB;  Service: Cardiovascular;;    Social History: Social History   Tobacco Use   Smoking status: Former    Packs/[name]: 1.00    Years: 42.00    Pack years: 42.00    Types: Cigarettes    Start date: 09/13/1975    Quit date: 01/25/2018  Years since quitting: 3.2   Smokeless tobacco: Former    Types: Chew   Tobacco comments:    Bupropion helping  Vaping Use   Vaping Use: Never used  Substance Use Topics   Alcohol use: Not Currently    Alcohol/week: 6.0 standard drinks    Types: 6 Cans of beer per week    Comment: pt reports "I quit 66 days ago" (05/09/16)   Drug use: No    Comment: Crack Cocaine (denies 05/09/16)    Family History: Family History  Problem Relation Age of Onset   Breast cancer Mother    Heart attack Father 71   Hypertension Sister    Hyperlipidemia Sister    Hyperlipidemia Sister    Hypertension Sister    Diabetes Sister    Heart attack Brother    Stroke Brother    Diabetes Brother    Colon cancer Neg Hx    Esophageal cancer Neg Hx    Rectal cancer Neg Hx    Stomach cancer Neg Hx     Allergies and Medications: Allergies  Allergen Reactions   Shellfish Allergy Nausea And Vomiting    OYSTERS   No current facility-administered medications on file prior to encounter.   Current Outpatient Medications on File Prior to Encounter  Medication Sig Dispense Refill   ACCU-CHEK FASTCLIX LANCETS MISC 1 Units  by Percutaneous route 4 (four) times daily. 100 each 12   albuterol (PROAIR HFA) 108 (90 Base) MCG/ACT inhaler INHALE TWO PUFFS BY MOUTH EVERY 6 HOURS AS NEEDED FOR WHEEZING OR FOR SHORTNESS OF BREATH 8.5 g 6   aspirin 81 MG chewable tablet Chew 1 tablet (81 mg total) by mouth daily. 180 tablet 2   atorvastatin (LIPITOR) 80 MG tablet Take 1 tablet (80 mg total) by mouth daily. 90 tablet 1   BD INSULIN SYRINGE U/F 31G X 5/16" 1 ML MISC USE UP TO FIVE TIMES A [name] WITH NOVOLOG AND LANTUS DOSING 100 each 1   Blood Glucose Monitoring Suppl (ACCU-CHEK GUIDE) w/Device KIT Check blood sugar once each morning 1 kit 0   buPROPion (WELLBUTRIN XL) 150 MG 24 hr tablet TAKE ONE TABLET BY MOUTH DAILY 90 tablet 2   carvedilol (COREG) 3.125 MG tablet Take 1 tablet (3.125 mg total) by mouth 2 (two) times daily with a meal. 180 tablet 3   citalopram (CELEXA) 20 MG tablet Take 1 tablet (20 mg total) by mouth daily. 90 tablet 1   DULERA 200-5 MCG/ACT AERO INHALE TWO PUFFS BY MOUTH TWICE A [name] 39 g 1   glucose blood (ACCU-CHEK AVIVA PLUS) test strip Use as instructed 100 strip 3   glucose blood (ACCU-CHEK GUIDE) test strip Use as instructed 100 each 4   Insulin Pen Needle (BD ULTRA-FINE PEN NEEDLES) 29G X 12.7MM MISC USE TO WITH VICTOZA PEN, INJECTION ONCE DAILY. 100 each 6   JARDIANCE 25 MG TABS tablet TAKE ONE TABLET BY MOUTH DAILY 90 tablet 3   ketoconazole (NIZORAL) 2 % cream Apply 1 application topically daily. 15 g 1   LANTUS 100 UNIT/ML injection INJECT 45 UNITS UNDER THE SKIN TWICE A [name] 30 mL 6   liraglutide (VICTOZA) 18 MG/3ML SOPN Inject 1.8 mg into the skin daily. 27 mL 11   lisinopril (ZESTRIL) 2.5 MG tablet Take 1 tablet (2.5 mg total) by mouth 2 (two) times daily. 180 tablet 3   Melatonin 2.5 MG CAPS Take 5 mg by mouth at bedtime.     metFORMIN (GLUCOPHAGE) 1000 MG  tablet Take 1 tablet (1,000 mg total) by mouth 2 (two) times daily with a meal. 180 tablet 3   Miconazole Nitrate 2 % AERO Spray between  toes every morning. 133 g 2   Multiple Vitamin (MULTIVITAMIN) tablet Take 1 tablet by mouth daily.     mupirocin ointment (BACTROBAN) 2 % Apply to eft great toe and 2nd toe every evening. 30 g 1   nitroGLYCERIN (NITROSTAT) 0.4 MG SL tablet DISSOLVE 1 TAB UNDER TONGUE FOR CHEST PAIN - IF PAIN REMAINS AFTER 5 MIN, CALL 911 AND REPEAT DOSE. MAX 3 TABS IN 15 MINUTES 100 tablet 2   NOVOLOG 100 UNIT/ML injection INJECT 30 UNITS INTO THE SKIN 2 (TWO) TIMES DAILY WITH A MEAL 30 mL 11   Omega-3 Fatty Acids (FISH OIL) 1000 MG CAPS Take 1 capsule (1,000 mg total) by mouth daily.     prasugrel (EFFIENT) 10 MG TABS tablet Take 1 tablet (10 mg total) by mouth daily. 90 tablet 3   traMADol (ULTRAM) 50 MG tablet TAKE ONE TABLET BY MOUTH EVERY 6 HOURS AS NEEDED 30 tablet 2    Objective: BP (!) 130/108   Pulse 85   Temp 98.1 F (36.7 C) (Axillary)   Resp (!) 26   Ht '5\' 7"'  (1.702 m)   Wt 131.5 kg   SpO2 94%   BMI 45.42 kg/m  Exam: General: Obese, in no acute distress, on BiPAP, able to speak in full sentences  Eyes: Pupils PERRL. EOMI. Bilateral eye discharge, no scleral icterus  Neck: Large, supple.  No JVD.  No cervical lymphadenopathy. Cardiovascular: Regular rate and rhythm, no murmurs appreciated Respiratory: Inspiratory and expiratory wheezing R>L, no crackles.  No increased work of breathing.  On BiPAP at FiO2 50%. Gastrointestinal: Round, nontender, normoactive bowel sounds MSK: No joint deformities.  Distal extremities warm, well perfused. Derm: Multiple scabs from what appears to be bug bites on bilateral anterior shins.  No cyanosis. Neuro: Awake, alert, oriented.  Able to answer questions in short sentences. Psych: Normal mood and affect  Labs and Imaging: CBC BMET  Recent Labs  Lab 05/09/21 2021  WBC 13.5*  HGB 15.9  HCT 49.7  PLT 261   Recent Labs  Lab 05/09/21 2021  NA 136  K 4.0  CL 103  CO2 24  BUN 10  CREATININE 0.68  GLUCOSE 159*  CALCIUM 8.9     EKG: Normal  sinus rhythm at 71 bpm, no acute ST/T wave changes  [name] [name]  05/09/2021, 11:02 PM PGY-1, [place] Intern pager: [phone], text pages welcome   FPTS Upper-Level Resident Addendum   I have independently interviewed and examined the patient. I have discussed the above with the original author and agree with their documentation. My edits for correction/addition/clarification are in black. Please see also any attending notes.   Lattie Haw MD PGY-3, [facility] 05/10/2021 2:03 AM  FPTS Service pager: [phone] (text pages welcome through [facility])

## 2021-05-09 NOTE — ED Notes (Signed)
Bipap removed per MD. Patient initially at 90% but then within 1-2 minutes dropped to 83-84% with c/o dyspnea, feeling hot and jittery. Patient placed back on BiPAP on previous settings. MD notified.

## 2021-05-09 NOTE — ED Provider Notes (Addendum)
[facility] EMERGENCY DEPARTMENT Provider Note   CSN: [id number] Arrival date & time: 05/09/21  2016     History Chief Complaint  Patient presents with   Shortness of Breath    [name] [name] is a 60 y.o. male.   Shortness of Breath Associated symptoms: cough   Associated symptoms: no abdominal pain and no chest pain   Patient presents with shortness of breath.  Is had for the last few days.  Worse the last few hours.  Has a cough.  Reported negative COVID test at home.  Has been vaccinated 3 times.  Uses CPAP at night.  7 sputum production with it.  Sats in the 80s for EMS.  Improved with albuterol Atrovent magnesium Solu-Medrol and CPAP.  States he is feeling somewhat better now.  No swelling in his legs.  No real chest pain.  Has apparent history of COPD.    Past Medical History:  Diagnosis Date   Alcoholism [facility])    Anxiety    Arthritis    "qwhere"   Asthma    CAD S/P percutaneous coronary angioplasty 01/18/12; 10/2013   a. pRCA 3.5 x 18 vision BMS - 4.2 mm; b. 2/'15: mCx 3.5 x 12  Rebel BMS (3.6-3.7 mm)   Chronic back pain    "mid/lower" (08/01/2014)   COPD (chronic obstructive pulmonary disease) ([place])    "I'm seeing COPD dr now; don't know if I've got it" (08/01/2014)   Depression    GERD (gastroesophageal reflux disease)    Hyperlipidemia    Hypertension    Migraines    "once in awhile" (08/01/2014)   Non-ST elevation myocardial infarction (NSTEMI) ([place]) 10/2013   OSA on CPAP 01/19/2012   Sleep apnea    Type II diabetes mellitus ([place])     Patient Active Problem List   Diagnosis Date Noted   Colon polyps 02/18/2020   Antiplatelet or antithrombotic long-term use 12/23/2019   Mild nonproliferative diabetic retinopathy of both eyes without macular edema associated with type 2 diabetes mellitus ([place]) 04/30/2019   Depression, acute 02/17/2016   Hearing difficulty 12/10/2014   COPD, moderate ([place]) 11/22/2014   Angina, class II ([place]) 08/01/2014   H/O  NSTEMI (non-ST elevated myocardial infarction) 11/07/2013   Tobacco abuse 08/24/2012   Obesity 07/31/2012   Insulin dependent type 2 diabetes mellitus ([place]) 06/05/2012   CAD S/P percutaneous coronary angioplasty 01/21/2012   OSA on CPAP 01/19/2012   Essential hypertension 01/18/2012   Hyperlipidemia with target LDL less than 70 01/18/2012    Past Surgical History:  Procedure Laterality Date   CARDIAC CATHETERIZATION  07/2014   Left Main: Short, large-caliber vessel. Widely patent. Bifurcates into the LAD and Circumflex. Angiographically normal.   CORONARY ANGIOPLASTY WITH STENT PLACEMENT  01/2012; 11/07/2013; 08/01/2014   "1 + 2 + 1"    CORONARY ANGIOPLASTY WITH STENT PLACEMENT  07/2014   Severe single-vessel disease involving the bifurcation of OM1 and OM 2 with 99% in-stent restenosis in the bare-metal stent placed in the OM 2.   LEFT HEART CATHETERIZATION WITH CORONARY ANGIOGRAM N/A 01/20/2012   Procedure: LEFT HEART CATHETERIZATION WITH CORONARY ANGIOGRAM;  Surgeon: Jettie Booze, MD;  Location: [facility] CATH LAB;  Service: Cardiovascular;  Laterality: N/A;   LEFT HEART CATHETERIZATION WITH CORONARY ANGIOGRAM N/A 11/07/2013   Procedure: LEFT HEART CATHETERIZATION WITH CORONARY ANGIOGRAM;  Surgeon: Jettie Booze, MD;  Location: [facility] CATH LAB;  Service: Cardiovascular;  Laterality: N/A;   LEFT HEART CATHETERIZATION WITH CORONARY ANGIOGRAM N/A  08/01/2014   Procedure: LEFT HEART CATHETERIZATION WITH CORONARY ANGIOGRAM;  Surgeon: Leonie Man, MD;  Location: [facility] CATH LAB;  Service: Cardiovascular;  Laterality: N/A;   MULTIPLE TOOTH EXTRACTIONS     PERCUTANEOUS CORONARY STENT INTERVENTION (PCI-S)  01/20/2012   Procedure: PERCUTANEOUS CORONARY STENT INTERVENTION (PCI-S);  Surgeon: Jettie Booze, MD;  Location: [facility] CATH LAB;  Service: Cardiovascular;;   PERCUTANEOUS CORONARY STENT INTERVENTION (PCI-S)  11/07/2013   Procedure: PERCUTANEOUS CORONARY STENT INTERVENTION (PCI-S);  Surgeon:  Jettie Booze, MD;  Location: [facility] CATH LAB;  Service: Cardiovascular;;       Family History  Problem Relation Age of Onset   Breast cancer Mother    Heart attack Father 57   Hypertension Sister    Hyperlipidemia Sister    Hyperlipidemia Sister    Hypertension Sister    Diabetes Sister    Heart attack Brother    Stroke Brother    Diabetes Brother    Colon cancer Neg Hx    Esophageal cancer Neg Hx    Rectal cancer Neg Hx    Stomach cancer Neg Hx     Social History   Tobacco Use   Smoking status: Former    Packs/day: 1.00    Years: 42.00    Pack years: 42.00    Types: Cigarettes    Start date: 09/13/1975    Quit date: 01/25/2018    Years since quitting: 3.2   Smokeless tobacco: Former    Types: Chew   Tobacco comments:    Bupropion helping  Vaping Use   Vaping Use: Never used  Substance Use Topics   Alcohol use: Not Currently    Alcohol/week: 6.0 standard drinks    Types: 6 Cans of beer per week    Comment: pt reports "I quit 66 days ago" (05/09/16)   Drug use: No    Comment: Crack Cocaine (denies 05/09/16)    Home Medications Prior to Admission medications   Medication Sig Start Date End Date Taking? Authorizing Provider  ACCU-CHEK FASTCLIX LANCETS MISC 1 Units by Percutaneous route 4 (four) times daily. 07/01/15   [name], MD  albuterol [facility] HFA) 108 (90 Base) MCG/ACT inhaler INHALE TWO PUFFS BY MOUTH EVERY 6 HOURS AS NEEDED FOR WHEEZING OR FOR SHORTNESS OF BREATH 05/06/21   Lind Covert, MD  aspirin 81 MG chewable tablet Chew 1 tablet (81 mg total) by mouth daily. 10/04/13   [name], MD  atorvastatin (LIPITOR) 80 MG tablet Take 1 tablet (80 mg total) by mouth daily. 10/13/20   Lind Covert, MD  BD INSULIN SYRINGE U/F 31G X 5/16" 1 ML MISC USE UP TO FIVE TIMES A DAY WITH NOVOLOG AND LANTUS DOSING 06/22/20   [name], MD  Blood Glucose Monitoring Suppl (ACCU-CHEK GUIDE) w/Device KIT Check blood sugar once each morning  07/24/20   Lind Covert, MD  buPROPion (WELLBUTRIN XL) 150 MG 24 hr tablet TAKE ONE TABLET BY MOUTH DAILY 02/15/21   Lind Covert, MD  carvedilol (COREG) 3.125 MG tablet Take 1 tablet (3.125 mg total) by mouth 2 (two) times daily with a meal. 05/19/20   Chambliss, Jeb Levering, MD  citalopram (CELEXA) 20 MG tablet Take 1 tablet (20 mg total) by mouth daily. 11/23/20   Lind Covert, MD  DULERA 200-5 MCG/ACT AERO INHALE TWO PUFFS BY MOUTH TWICE A DAY 02/03/21   Chambliss, Jeb Levering, MD  glucose blood (ACCU-CHEK AVIVA PLUS) test strip Use as instructed  11/16/20   Lind Covert, MD  glucose blood (ACCU-CHEK GUIDE) test strip Use as instructed 07/30/20   Lind Covert, MD  Insulin Pen Needle (BD ULTRA-FINE PEN NEEDLES) 29G X 12.7MM MISC USE TO WITH VICTOZA PEN, INJECTION ONCE DAILY. 03/12/21   Lind Covert, MD  JARDIANCE 25 MG TABS tablet TAKE ONE TABLET BY MOUTH DAILY 02/22/21   Lind Covert, MD  ketoconazole (NIZORAL) 2 % cream Apply 1 application topically daily. 12/14/20   Lind Covert, MD  LANTUS 100 UNIT/ML injection INJECT 45 UNITS UNDER THE SKIN TWICE A DAY 11/09/20   Chambliss, Jeb Levering, MD  liraglutide (VICTOZA) 18 MG/3ML SOPN Inject 1.8 mg into the skin daily. 02/17/21   Lind Covert, MD  lisinopril (ZESTRIL) 2.5 MG tablet Take 1 tablet (2.5 mg total) by mouth 2 (two) times daily. 02/17/21   Lind Covert, MD  Melatonin 2.5 MG CAPS Take 5 mg by mouth at bedtime.    [provider]  metFORMIN (GLUCOPHAGE) 1000 MG tablet Take 1 tablet (1,000 mg total) by mouth 2 (two) times daily with a meal. 01/12/21   Chambliss, Jeb Levering, MD  Miconazole Nitrate 2 % AERO Spray between toes every morning. 11/10/20   Marzetta Board, DPM  Multiple Vitamin (MULTIVITAMIN) tablet Take 1 tablet by mouth daily.    [provider]  mupirocin ointment (BACTROBAN) 2 % Apply to eft great toe and 2nd toe every evening. 10/26/20    Marzetta Board, DPM  nitroGLYCERIN (NITROSTAT) 0.4 MG SL tablet DISSOLVE 1 TAB UNDER TONGUE FOR CHEST PAIN - IF PAIN REMAINS AFTER 5 MIN, CALL 911 AND REPEAT DOSE. MAX 3 TABS IN 15 MINUTES 10/27/20   Chambliss, Jeb Levering, MD  NOVOLOG 100 UNIT/ML injection INJECT 30 UNITS INTO THE SKIN 2 (TWO) TIMES DAILY WITH A MEAL 01/01/21   Chambliss, Jeb Levering, MD  Omega-3 Fatty Acids (FISH OIL) 1000 MG CAPS Take 1 capsule (1,000 mg total) by mouth daily. 08/01/19   Zenia Resides, MD  prasugrel (EFFIENT) 10 MG TABS tablet Take 1 tablet (10 mg total) by mouth daily. 04/06/21   [place], Peter M, MD  traMADol Veatrice [place]bon) 50 MG tablet TAKE ONE TABLET BY MOUTH EVERY 6 HOURS AS NEEDED 01/20/21   Lind Covert, MD    Allergies    Shellfish allergy  Review of Systems   Review of Systems  Constitutional:  Negative for appetite change.  HENT:  Negative for congestion.   Respiratory:  Positive for cough and shortness of breath.   Cardiovascular:  Negative for chest pain and leg swelling.  Gastrointestinal:  Negative for abdominal pain.  Genitourinary:  Negative for flank pain.  Musculoskeletal:  Negative for back pain.  Neurological:  Negative for weakness.   Physical Exam Updated Vital Signs BP (!) 130/108   Pulse 85   Temp 98.1 F (36.7 C) (Axillary)   Resp (!) 26   Ht '5\' 7"'  (1.702 m)   Wt 131.5 kg   SpO2 94%   BMI 45.42 kg/m   Physical Exam Vitals reviewed.  HENT:     Head: Normocephalic.  Cardiovascular:     Rate and Rhythm: Normal rate and regular rhythm.  Pulmonary:     Comments: Mildly harsh breath sounds without focal rales or rhonchi.  Patient is on BiPAP. Chest:     Chest wall: No tenderness.  Abdominal:     Tenderness: There is no abdominal tenderness.  Musculoskeletal:     Cervical  back: Neck supple.     Right lower leg: No edema.     Left lower leg: No edema.  Skin:    General: Skin is warm.     Capillary Refill: Capillary refill takes less than 2 seconds.   Neurological:     Mental Status: He is alert and oriented to person, place, and time.    ED Results / Procedures / Treatments   Labs (all labs ordered are listed, but only abnormal results are displayed) Labs Reviewed  BASIC METABOLIC PANEL - Abnormal; Notable for the following components:      Result Value   Glucose, Bld 159 (*)    All other components within normal limits  CBC - Abnormal; Notable for the following components:   WBC 13.5 (*)    All other components within normal limits  RESP PANEL BY RT-PCR (FLU A&B, COVID) [id number]  TROPONIN I (HIGH SENSITIVITY)  TROPONIN I (HIGH SENSITIVITY)    EKG EKG Interpretation  Date/Time:  Sunday May 09 2021 20:21:39 EDT Ventricular Rate:  71 PR Interval:  200 QRS Duration: 89 QT Interval:  394 QTC Calculation: 429 R Axis:   -9 Text Interpretation: Sinus rhythm Confirmed by Davonna Belling [phone]) on 05/09/2021 8:43:14 PM  Radiology DG Chest Portable 1 View  Result Date: 05/09/2021 CLINICAL DATA:  Shortness of breath EXAM: PORTABLE CHEST 1 VIEW COMPARISON:  Jan 29, 2020 FINDINGS: Stable cardiomegaly. The hila and mediastinum are normal. No pneumothorax. No nodules or masses. No focal infiltrates. IMPRESSION: No active disease. Electronically Signed   By: Dorise Bullion III M.D.   On: 05/09/2021 21:35    Procedures Procedures   Medications Ordered in ED Medications  albuterol (PROVENTIL) (2.5 MG/3ML) 0.083% nebulizer solution 5 mg (has no administration in time range)    ED Course  I have reviewed the triage vital signs and the nursing notes.  Pertinent labs & imaging results that were available during my care of the patient were reviewed by me and considered in my medical decision making (see chart for details).    MDM Rules/Calculators/A&P                           Patient presents with shortness of breath.  Hypoxia.  Reported is been coughing for the last few days with some sputum production.  Hypoxic upon  arrival for EMS.  Hypoxic here without BiPAP.  However does well on BiPAP.  More shortness of breath today but is had cough nasal congestion for the last few days.  Negative COVID test here and at home.  Chest x-ray does not show infiltrate.  With continued hypoxia will admit to family practice.  Also sore throat, however unable to visualize due to BiPAP at this time  CRITICAL CARE Performed by: Davonna Belling Total critical care time: 30 minutes Critical care time was exclusive of separately billable procedures and treating other patients. Critical care was necessary to treat or prevent imminent or life-threatening deterioration. Critical care was time spent personally by me on the following activities: development of treatment plan with patient and/or surrogate as well as nursing, discussions with consultants, evaluation of patient's response to treatment, examination of patient, obtaining history from patient or surrogate, ordering and performing treatments and interventions, ordering and review of laboratory studies, ordering and review of radiographic studies, pulse oximetry and re-evaluation of patient's condition.  Final Clinical Impression(s) / ED Diagnoses Final diagnoses:  COPD exacerbation ([place])  Hypoxia  Rx / DC Orders ED Discharge Orders     None        Davonna Belling, MD 05/09/21 2241    Davonna Belling, MD 05/09/21 [phone]

## 2021-05-09 NOTE — ED Notes (Signed)
ED Provider at bedside. 

## 2021-05-09 NOTE — ED Triage Notes (Signed)
60 y/o male with PMH of OSA on CPAP, COPD, former heavy smoker who presents via [facility] with c/o severe dyspnea that began 2 hours prior to calling 911. He reports over the past 4 days of chills, cough, nasal congestion. Tested negative on home covid. Reports taking NyQuil, cough drops, home albuterol, and using home CPAP without benefit. Symptoms significantly worsened today and upon EMS arrival he was noted to be in respiratory distress in tripod position. SpO2 on their arrival was noted to be 80% and RR 28. He improved in route after administration of Albuterol, Atrovent, Magnesium, Solumedrol, and CPAP placement.

## 2021-05-09 NOTE — Progress Notes (Signed)
RT set up BIPAP and placed on patient. Patient is tolerating BIPAP settings well at this time. RT will continue to monitor as needed.

## 2021-05-10 ENCOUNTER — Inpatient Hospital Stay (HOSPITAL_COMMUNITY): Payer: Medicaid Other

## 2021-05-10 DIAGNOSIS — R0602 Shortness of breath: Secondary | ICD-10-CM | POA: Diagnosis not present

## 2021-05-10 DIAGNOSIS — J441 Chronic obstructive pulmonary disease with (acute) exacerbation: Secondary | ICD-10-CM | POA: Diagnosis not present

## 2021-05-10 DIAGNOSIS — R0902 Hypoxemia: Secondary | ICD-10-CM | POA: Insufficient documentation

## 2021-05-10 LAB — I-STAT ARTERIAL BLOOD GAS, ED
Acid-base deficit: 1 mmol/L (ref 0.0–2.0)
Bicarbonate: 25.6 mmol/L (ref 20.0–28.0)
Calcium, Ion: 1.25 mmol/L (ref 1.15–1.40)
HCT: 45 % (ref 39.0–52.0)
Hemoglobin: 15.3 g/dL (ref 13.0–17.0)
O2 Saturation: 99 %
Patient temperature: 97.9
Potassium: 4 mmol/L (ref 3.5–5.1)
Sodium: 136 mmol/L (ref 135–145)
TCO2: 27 mmol/L (ref 22–32)
pCO2 arterial: 47.3 mmHg (ref 32.0–48.0)
pH, Arterial: 7.34 — ABNORMAL LOW (ref 7.350–7.450)
pO2, Arterial: 141 mmHg — ABNORMAL HIGH (ref 83.0–108.0)

## 2021-05-10 LAB — ECHOCARDIOGRAM COMPLETE
AR max vel: 2.37 cm2
AV Area VTI: 2.22 cm2
AV Area mean vel: 2.2 cm2
AV Mean grad: 8 mmHg
AV Peak grad: 15.8 mmHg
Ao pk vel: 1.99 m/s
Area-P 1/2: 2.56 cm2
Height: 67 in
S' Lateral: 3.1 cm
Single Plane A4C EF: 66.1 %
Weight: 4640 oz

## 2021-05-10 LAB — BASIC METABOLIC PANEL
Anion gap: 10 (ref 5–15)
BUN: 12 mg/dL (ref 6–20)
CO2: 24 mmol/L (ref 22–32)
Calcium: 9 mg/dL (ref 8.9–10.3)
Chloride: 103 mmol/L (ref 98–111)
Creatinine, Ser: 0.69 mg/dL (ref 0.61–1.24)
GFR, Estimated: >60 mL/min (ref >60)
Glucose, Bld: 210 mg/dL — ABNORMAL HIGH (ref 70–99)
Potassium: 4.3 mmol/L (ref 3.5–5.1)
Sodium: 137 mmol/L (ref 135–145)

## 2021-05-10 LAB — CBG MONITORING, ED
Glucose-Capillary: 217 mg/dL — ABNORMAL HIGH (ref 70–99)
Glucose-Capillary: 260 mg/dL — ABNORMAL HIGH (ref 70–99)
Glucose-Capillary: 195 mg/dL — ABNORMAL HIGH (ref 70–99)
Glucose-Capillary: 183 mg/dL — ABNORMAL HIGH (ref 70–99)

## 2021-05-10 LAB — CBC
HCT: 49.7 % (ref 39.0–52.0)
Hemoglobin: 16.1 g/dL (ref 13.0–17.0)
MCH: 29.8 pg (ref 26.0–34.0)
MCHC: 32.4 g/dL (ref 30.0–36.0)
MCV: 91.9 fL (ref 80.0–100.0)
Platelets: 239 10*3/uL (ref 150–400)
RBC: 5.41 MIL/uL (ref 4.22–5.81)
RDW: 13.9 % (ref 11.5–15.5)
WBC: 13.7 10*3/uL — ABNORMAL HIGH (ref 4.0–10.5)
nRBC: 0 % (ref 0.0–0.2)

## 2021-05-10 LAB — D-DIMER, QUANTITATIVE: D-Dimer, Quant: 1.59 ug/mL-FEU — ABNORMAL HIGH (ref 0.00–0.50)

## 2021-05-10 LAB — TROPONIN I (HIGH SENSITIVITY)
Troponin I (High Sensitivity): 12 ng/L
Troponin I (High Sensitivity): 12 ng/L (ref <18)
Troponin I (High Sensitivity): 8 ng/L (ref <18)

## 2021-05-10 LAB — BRAIN NATRIURETIC PEPTIDE: B Natriuretic Peptide: 145.9 pg/mL — ABNORMAL HIGH (ref 0.0–100.0)

## 2021-05-10 LAB — HIV ANTIBODY (ROUTINE TESTING W REFLEX): HIV Screen 4th Generation wRfx: NONREACTIVE

## 2021-05-10 MED ORDER — ATORVASTATIN CALCIUM 80 MG PO TABS
80.0000 mg | ORAL_TABLET | Freq: Every day | ORAL | Status: DC
  Administered 2021-05-10 – 2021-05-14 (×5): 80 mg via ORAL
  Filled 2021-05-10: qty 1
  Filled 2021-05-10: qty 2
  Filled 2021-05-10 (×3): qty 1

## 2021-05-10 MED ORDER — ASPIRIN 81 MG PO CHEW
81.0000 mg | CHEWABLE_TABLET | Freq: Every day | ORAL | Status: DC
  Administered 2021-05-10 – 2021-05-14 (×5): 81 mg via ORAL
  Filled 2021-05-10 (×5): qty 1

## 2021-05-10 MED ORDER — AZITHROMYCIN 500 MG PO TABS
500.0000 mg | ORAL_TABLET | Freq: Every day | ORAL | Status: AC
  Administered 2021-05-10 – 2021-05-12 (×3): 500 mg via ORAL
  Filled 2021-05-10: qty 2
  Filled 2021-05-10 (×2): qty 1

## 2021-05-10 MED ORDER — INSULIN ASPART 100 UNIT/ML IJ SOLN
0.0000 [IU] | Freq: Three times a day (TID) | INTRAMUSCULAR | Status: DC
  Administered 2021-05-10 (×2): 4 [IU] via SUBCUTANEOUS
  Administered 2021-05-10: 11 [IU] via SUBCUTANEOUS
  Administered 2021-05-11 (×3): 7 [IU] via SUBCUTANEOUS
  Administered 2021-05-12: 11 [IU] via SUBCUTANEOUS
  Administered 2021-05-12: 4 [IU] via SUBCUTANEOUS
  Administered 2021-05-12 – 2021-05-13 (×2): 7 [IU] via SUBCUTANEOUS
  Administered 2021-05-13 – 2021-05-14 (×2): 4 [IU] via SUBCUTANEOUS

## 2021-05-10 MED ORDER — IPRATROPIUM-ALBUTEROL 0.5-2.5 (3) MG/3ML IN SOLN
3.0000 mL | Freq: Four times a day (QID) | RESPIRATORY_TRACT | Status: DC
  Administered 2021-05-10 (×4): 3 mL via RESPIRATORY_TRACT
  Filled 2021-05-10 (×4): qty 3

## 2021-05-10 MED ORDER — INSULIN ASPART 100 UNIT/ML IJ SOLN: 0.0000 [IU] | Freq: Three times a day (TID) | INTRAMUSCULAR | Status: DC

## 2021-05-10 MED ORDER — INSULIN ASPART 100 UNIT/ML IJ SOLN
6.0000 [IU] | Freq: Three times a day (TID) | INTRAMUSCULAR | Status: DC
  Administered 2021-05-10 – 2021-05-14 (×11): 6 [IU] via SUBCUTANEOUS

## 2021-05-10 MED ORDER — BUPROPION HCL ER (XL) 150 MG PO TB24
150.0000 mg | ORAL_TABLET | Freq: Every day | ORAL | Status: DC
  Administered 2021-05-10 – 2021-05-14 (×5): 150 mg via ORAL
  Filled 2021-05-10 (×5): qty 1

## 2021-05-10 MED ORDER — ALBUTEROL SULFATE (2.5 MG/3ML) 0.083% IN NEBU
2.5000 mg | INHALATION_SOLUTION | RESPIRATORY_TRACT | Status: DC | PRN
  Administered 2021-05-11: 2.5 mg via RESPIRATORY_TRACT
  Filled 2021-05-10 (×2): qty 3

## 2021-05-10 MED ORDER — IOHEXOL 350 MG/ML SOLN
73.0000 mL | Freq: Once | INTRAVENOUS | Status: AC | PRN
  Administered 2021-05-10: 73 mL via INTRAVENOUS

## 2021-05-10 MED ORDER — ENOXAPARIN SODIUM 40 MG/0.4ML IJ SOSY: 40.0000 mg | PREFILLED_SYRINGE | Freq: Every day | INTRAMUSCULAR | Status: DC

## 2021-05-10 MED ORDER — METHYLPREDNISOLONE SODIUM SUCC 125 MG IJ SOLR
120.0000 mg | Freq: Two times a day (BID) | INTRAMUSCULAR | Status: DC
  Administered 2021-05-10: 120 mg via INTRAVENOUS
  Filled 2021-05-10: qty 2

## 2021-05-10 MED ORDER — PRASUGREL HCL 10 MG PO TABS
10.0000 mg | ORAL_TABLET | Freq: Every day | ORAL | Status: DC
  Administered 2021-05-10 – 2021-05-14 (×5): 10 mg via ORAL
  Filled 2021-05-10 (×6): qty 1

## 2021-05-10 MED ORDER — CITALOPRAM HYDROBROMIDE 20 MG PO TABS
20.0000 mg | ORAL_TABLET | Freq: Every day | ORAL | Status: DC
  Administered 2021-05-10 – 2021-05-14 (×5): 20 mg via ORAL
  Filled 2021-05-10 (×2): qty 1
  Filled 2021-05-10: qty 2
  Filled 2021-05-10 (×2): qty 1

## 2021-05-10 MED ORDER — MOMETASONE FURO-FORMOTEROL FUM 200-5 MCG/ACT IN AERO
2.0000 | INHALATION_SPRAY | Freq: Two times a day (BID) | RESPIRATORY_TRACT | Status: DC
  Administered 2021-05-10: 2 via RESPIRATORY_TRACT
  Filled 2021-05-10: qty 8.8

## 2021-05-10 MED ORDER — ENOXAPARIN SODIUM 60 MG/0.6ML IJ SOSY
60.0000 mg | PREFILLED_SYRINGE | Freq: Every day | INTRAMUSCULAR | Status: DC
  Administered 2021-05-10 – 2021-05-14 (×5): 60 mg via SUBCUTANEOUS
  Filled 2021-05-10 (×5): qty 0.6

## 2021-05-10 MED ORDER — PREDNISONE 20 MG PO TABS
40.0000 mg | ORAL_TABLET | Freq: Every day | ORAL | Status: DC
  Administered 2021-05-11 – 2021-05-14 (×4): 40 mg via ORAL
  Filled 2021-05-10 (×4): qty 2

## 2021-05-10 MED ORDER — LISINOPRIL 5 MG PO TABS
2.5000 mg | ORAL_TABLET | Freq: Two times a day (BID) | ORAL | Status: DC
  Administered 2021-05-10 (×3): 2.5 mg via ORAL
  Filled 2021-05-10 (×3): qty 1

## 2021-05-10 MED ORDER — MELATONIN 5 MG PO TABS
5.0000 mg | ORAL_TABLET | Freq: Every day | ORAL | Status: DC
  Administered 2021-05-10 – 2021-05-13 (×5): 5 mg via ORAL
  Filled 2021-05-10 (×5): qty 1

## 2021-05-10 MED ORDER — INSULIN GLARGINE-YFGN 100 UNIT/ML ~~LOC~~ SOLN
20.0000 [IU] | Freq: Two times a day (BID) | SUBCUTANEOUS | Status: DC
  Administered 2021-05-10 – 2021-05-12 (×6): 20 [IU] via SUBCUTANEOUS
  Filled 2021-05-10 (×7): qty 0.2

## 2021-05-10 MED ORDER — CARVEDILOL 3.125 MG PO TABS
3.1250 mg | ORAL_TABLET | Freq: Two times a day (BID) | ORAL | Status: DC
  Administered 2021-05-10 – 2021-05-14 (×9): 3.125 mg via ORAL
  Filled 2021-05-10 (×9): qty 1

## 2021-05-10 NOTE — Progress Notes (Signed)
FPTS Brief Progress Note  S Saw patient at bedside this evening. Patient was on nasal cannula 5L while he was eating dinner. Would like to go back on BiPAP afterwards. Dyspnea is improved and speaking in full sentences. No other concerns.   O: BP 130/76   Pulse 71   Temp 97.9 F (36.6 C) (Axillary)   Resp 17   Ht '5\' 7"'$  (1.702 m)   Wt 131.5 kg   SpO2 96%   BMI 45.42 kg/m    General: Alert, no acute distress, pleasant, nasal cannula  Cardio: Normal S1 and S2, RRR, no r/m/g Pulm: CTAB, normal work of breathing Neuro: Cranial nerves grossly intact   A/P: Plan per day team  -Monitor respiratory status -Wean off BiPAP as able - Orders reviewed. Labs for AM ordered, which was adjusted as needed.  - If condition changes, plan includes repeat ABG, CXR etc.   Lattie Haw, MD 05/10/2021, 8:04 PM PGY-3, [facility] Night Resident  Please page [phone] with questions.

## 2021-05-10 NOTE — ED Notes (Signed)
Placed back on Bipap. No issues while off of Bipap to eat.

## 2021-05-10 NOTE — ED Notes (Signed)
Echo at bedside

## 2021-05-10 NOTE — Hospital Course (Addendum)
[name] [name] is a 60 y.o. male who presented with SOB found to be in acute hypoxic respiratory failure that was likely 2/2 to COPD exacerbation.   Acute Hypoxic Respiratory Failure 2/2 COPD Exacerbation  Patient intially was hypoxic to 80s and required bipap shortly after admission. Admission labs were notable for BNP of 145 and troponin levels that trended flat (8, 12 and 12). He had a normal CXR and RVP on admission.  Patient had no signs of a pulmonary embolism with lack of tachycardia hemoptysis and a well score 0.  CHF unlikely because patient was not fluid overloaded on exam.  Patient was given IV steroids, duo nebs, was placed on azithromycin. Echocardiogram was completed and showed left ventricular ejection fraction 60 to 65% with normal left ventricular function and mild left ventricular hypertrophy.  Presence of grade 1 diastolic dysfunction. CTA showed no evidence of pulmonary embolism but did show evidence of consolidation/collapse of lateral right middle lobe.  He was started on azithromycin and also started on ceftriaxone. He completed a five day course of prednisone 40 mg. He was eventually weaned off of BiPAP on hospital day 3 to nasal cannula. On hospital day 4, he was off oxygen for a period of several hours with sats above 92%. On hospital day 5, he was off of O2 and passed an ambulatory O2 test. He consistently required BiPAP/CPAP at night for OSA.  He was switched from ceftriaxone to Cefdinir in anticipation of D/C. He was discharged with three doses of cefdinir to be finished outpatient.   DM2 Home meds: Lantus 45 units BID, Jardiance '25mg'$ , novolog 30U BID , metformin '1000mg'$  BID, Victoza 1.'8mg'$  injection once weekly.  Patient has been on NovoLog 6 units 3 times a day, resistant sliding scale insulin, and Semglee 30 units 2 times daily and CBGs have been elevated. With restart of home MF and Jardiance, his CBGs decreased significantly. His Semglee was reduced from 30 units BID to 26 units  BID. He was discharged on his home diabetic regimen and instructed to follow up with PCP.    HTN  Blood pressure range has been wide from 130s to 180s/80s.  He was on home medicine of lisinopril 2.5 mg twice daily.  Due to soft blood pressures, antihypertensive regimen discontinued then coreg restarted. Discharged on only home home coreg. Lisinopril was not continued, needs reevaluation at follow up with PCP.     Issues for PCP follow up  Follow-up for blood pressure, home meds include lisinopril and coreg. Discharged on home coreg. Lisinopril was discontinued prior to discharge. Please check BP and adjust as appropriate.  Repeat glucose, discharged home on home diabetic regimen. Ensure patient completes antibiotic course.

## 2021-05-10 NOTE — ED Notes (Signed)
Admitting provider at bedside.

## 2021-05-10 NOTE — Progress Notes (Signed)
Seen pt at bedside. He is on the BIPAP, 8L HFNC. Doing well, had some bouts of coughing. Wants to stay on the BiPAP overnight. Continue plan per H&P.  [name] [name]  PGY-3, [place]

## 2021-05-10 NOTE — ED Notes (Signed)
Patient was taken off BiPAP in order to eat. He was placed on 8L and SpO2 remains around 90% and he is tolerating it well without getting into respiratory discomfort. Time off BiPAP thus far is roughly 15 minutes. Will place back on BiPAP after completion of his meal.

## 2021-05-10 NOTE — Progress Notes (Addendum)
Family Medicine Teaching Service Daily Progress Note Intern Pager: [phone]  Patient name: [name] [name] Medical record number: [id number] Date of birth: 02/04/1961 Age: 60 y.o. Gender: male  Primary Care Provider: Lind Covert, MD Consultants: None  Code Status: Full  Pt Overview and Major Events to Date:  8/29 admitted to [facility] for COPD exacerbation  8/30 CT showed RML consolidation   Assessment and Plan:  [name] [name] is a 60 year old male presenting with dyspnea.  Past medical history significant for COPD, hyperlipidemia, hypertension, CAD, depression, DM 2, CAD with history of NSTEMI, history of alcohol use disorder.  Acute hypoxic respiratory failure secondary to COPD exacerbation vs PNA COPD exacerbation has seemingly improved, however not to the extent that we would expect in this timeframe. When I saw the pt this morning, he was still on BiPaP but was transitioned to 5L [name] [name]. Patient SPO2 is 93%. WBC 13.5 >22.5 and has remained afebrile.  Shortness of breath has improved significantly. Echo showed EF 60-65% mild LVH with G1DD.  CTA right middle lobe showed collapse/consolidation, negative for PE. Due to CTA findings, we will continue with treatment for PNA.  -Vital signs per floor routine -Carb modified diet -Continuous cardiac and pulse ox monitoring -Azithromycin '500mg'$  day 2/3 (8/29-8/31) -Ceftriaxone started 2g day 1/5-PNA coverage  (8/30-9/5)  -PT/OT to evaluate and treat -Scheduled DuoNebs every 6 hours -P.O. prednisone (8/29-9/4)  -Lovenox for DVT prophylaxis -Will update pt's mother with status   DM2 insulin-dependent CBGs: [phone].  Home meds: Lantus 45 units twice daily, Jardiance 25 mg, NovoLog 30 units twice daily, metformin 1000 mg twice daily, Victoza 1.8 mg injection once weekly (last dose unknown) -Currently on resistant SSI -Semglee 20 units 2 times daily can likely increase to 25 units  -NovoLog 6 units 3 times daily with meals -Holding other home  medications -CBG every 4 hours daily before meals and at bedtime  CAD, NSTEMI status post PCI stent x3 Patient denies chest pain today.  Patient was instructed to continue on dual antiplatelet therapy with ASA plus Effient.  All meds include Coreg 3.125 mg twice daily, ASA 81 mg daily, Effient 10 mg daily. -Continue DAPT -Cardiac monitoring -Echo shows: EF 60-65% mild LVH with G1DD.    Hypertension Blood pressure currently: 104/46, 95/48.  Home medication lisinopril 2.5 mg twice daily. - Hold lisinopril given BP on BiPap   HLD LDL 70 and at goal from 2021.  Patient is taking atorvastatin 40 mg daily and will continue this medication here.  Back pain chronic and stable Chronic and stable.  Patient takes tramadol 50 mg every 6 hours for back pain. However, he has not taken it in several weeks, so we will not restart it here.  Currently Tylenol is taking care of the pain. Tylenol as needed for back pain  History of tobacco use disorder Quit smoking 1 year ago but used to have a 40-pack-year history.  Home medication includes: Wellbutrin 150 mg daily  Depression  On home medication: Celexa 20 mg daily  OSA  Uses CPAP at night -Can continue with CPAP now the patient is now off of BiPaP if he can successfully remain off of BiPaP  FEN/GI: Carb modified PPx: Lovenox Dispo: Home pending clinical improvement  Subjective:  Patient reports that he still has difficulty breathing with the cough and sputum production.  He has also been experiencing nasal congestion.  He would like for me to call his mother to update her.   Objective: Temp:  [97.6 F (36.4  C)-98.1 F (36.7 C)] 97.6 F (36.4 C) (08/30 1158) Pulse Rate:  [53-125] 80 (08/30 1311) Resp:  [9-20] 20 (08/30 1311) BP: (93-152)/(45-114) 93/53 (08/30 1158) SpO2:  [91 %-100 %] 94 % (08/30 1311) FiO2 (%):  [40 %-50 %] 40 % (08/30 1311) Weight:  [130.4 kg] 130.4 kg (08/30 0430) Physical Exam Vitals reviewed.  Constitutional:       Appearance: He is well-developed.     Comments: BiPaP in place   Cardiovascular:     Rate and Rhythm: Normal rate and regular rhythm.  Pulmonary:     Comments: Some wheezing present. No increased work of breathing  Abdominal:     General: Bowel sounds are normal.     Palpations: Abdomen is soft.     Tenderness: There is no abdominal tenderness.  Skin:    General: Skin is warm and dry.  Neurological:     Mental Status: He is alert.  Psychiatric:        Mood and Affect: Mood normal.        Behavior: Behavior normal.    Laboratory: Recent Labs  Lab 05/09/21 2021 05/10/21 0209 05/10/21 1532 05/11/21 0056  WBC 13.5* 13.7*  --  22.5*  HGB 15.9 16.1 15.3 14.9  HCT 49.7 49.7 45.0 44.5  PLT 261 239  --  251   Recent Labs  Lab 05/09/21 2021 05/10/21 0209 05/10/21 1532  NA 136 137 136  K 4.0 4.3 4.0  CL 103 103  --   CO2 24 24  --   BUN 10 12  --   CREATININE 0.68 0.69  --   CALCIUM 8.9 9.0  --   GLUCOSE 159* 210*  --     CTA right middle lobe showed collapse/consolidation, negative for PE Echo LVH EF 60-65% G1DD  Erskine Emery, MD 05/11/2021, 1:31 PM PGY-1, [place] Intern pager: [phone], text pages welcome

## 2021-05-10 NOTE — ED Notes (Signed)
Patient removed from Bi-Pap and placed on 6L [place] to eat.

## 2021-05-10 NOTE — ED Notes (Signed)
Patient transported to CT 

## 2021-05-10 NOTE — Progress Notes (Signed)
RT removed patient from Servo I BIPAP and placed patient on V60 BIPAP at this time. Patient is tolerating BIPAP settings well. RT will monitor as needed.

## 2021-05-10 NOTE — Progress Notes (Addendum)
Family Medicine Teaching Service Daily Progress Note Intern Pager: [phone]  Patient name: [name] [name] Medical record number: [id number] Date of birth: 1960-11-30 Age: 60 y.o. Gender: male  Primary Care Provider: Lind Covert, MD Consultants: None   Code Status: Full  Pt Overview and Major Events to Date:  8/29 Admitted to FPTS  Assessment and Plan:  [name] [name] is a 60 yo male presenting with dyspnea. PMH significant for COPD, HLD, HTN, CAD, depression, DM2, CAD w/ h/o of NSTEMI, h/o tobacco use disorder   Acute hypoxic respiratory failure secondary to COPD exacerbation Currently has chronic COPD, 40 pack-year smoking history. Likely cause to be COPD exacerbation. WBC 13.5, has remained afebrile. Pt was initially started on BiPaP with improvement of hypoxia. Trialed off BiPaP and was not tolerated with sats dropping to 83-84%. Pt was restarted on BiPaP 50% to maintain O2 >88%.  Currently on [place]  and satting 95%.  Unlikely the cause to be pulmonary embolism given lack of tachycardia, hemoptysis, and well score of 0.  CHF is also not likely.  He does not appear to be fluid overloaded on exam and BNP is 145.9.  No signs of MI at this time troponins remain flat and EKG unremarkable.  -Vitals signs per floor routine -Carb modified diet  -Continuous cardiac monitoring and pulse ox -On 2L [place] -If started back on BiPaP will continue with ABG -O2 sat greater than 88% -S/P IV Solu-Medrol 60 mg every 6 hours, will switch to p.o. prednisone -Scheduled DuoNebs every 6 hours -Azithromycin 100 mg once daily started on 8/29 -PT/OT evaluate and treat -Age adjusted d dimer 0.59-VTE possible; D-dimer 1.59  -Will continue with CTA  -We will get echo once off BiPAP -Lovenox for DVT prophylaxis  DM2 insulin-dependent CBG 159>217>260.  Home meds: Lantus 45 units BID, Jardiance '25mg'$ , novolog 30U BID , metformin '1000mg'$  BID, Victoza 1.'8mg'$  injection once weekly (last dose unknown) -Resistant  SSI -Semglee 20 units 2 times daily -Adding novolog 6U 3xdaily with meals -Holding metformin, Victoza, and Jardiance -CBG every 4 hours daily before meals and at bedtime  CAD, NSTEMI status post PCI stent x3 Patient still denies chest pain but endorses chest tightness with recent hypoxia.  Low suspicion for ACS troponins have been flat 8 > 12 > 12 with an EKG that is normal sinus rhythm.  Patient has extensive cardiac history: RCA stent in May 2013.  NSTEMI in 2015, stent in left circumflex.  Repeat angina in October 2015, repeat cath with stent x2 placement.  Has preserved LVEF with EF of 55 to 65%.  He follows Dr. Peter Martinique every 6 months with last visit 04/06/2021 and was instructed to continue DAPT with ASA plus Effient.  Home meds: Coreg 3.125 mg twice daily, aspirin 81 mg daily, Effient 10 mg daily. -We will continue DAPT -Cardiac monitoring  -We will get an updated echo once patient is off of BiPAP -BNP 145.9 -Troponins have been flat we will no longer trend  HTN BP: 180/94 with range 130s-160s/80s.  Home meds: Lisinopril 2.5 mg twice daily -Continue home medication -Monitor   HLD LDL 09/02/20 70 and at goal. Home med: Atorvastatin '40mg'$  daily  -Continue home meds   Back Pain  Chronic and stable. Home meds: Tramadol '50mg'$  Q6hr-has not taken in several weeks.  -Tylenol PRN for back pain   H/o tobacco use disorder  Quit smoking 1 year ago. Home meds: Wellbutrin '150mg'$  daily   Depression  Chronic, stable. Home meds: Celexa 20 mg daily -  Continue home meds   OSA Pt uses CPAP at night. -Continue Bipap for now given COPD exacerbation -We will switch to CPAP when stable    FEN/GI: Carb modified  PPx: lovenox  Dispo:Home pending clinical improvement . Barriers include clinical improvement.   Subjective:  Pt reports that he is better this morning but still SOB with a cough. Denies any chest pain at this time. No other complaints.   Objective: Temp:  [97.9 F (36.6 C)-98.1  F (36.7 C)] 97.9 F (36.6 C) (08/29 0714) Pulse Rate:  [68-109] 78 (08/29 1330) Resp:  [12-26] 22 (08/29 1330) BP: (110-180)/(52-108) 130/68 (08/29 1330) SpO2:  [80 %-100 %] 97 % (08/29 1330) FiO2 (%):  [40 %-50 %] 50 % (08/29 0740) Weight:  [131.5 kg] 131.5 kg (08/28 2029)  Physical Exam Constitutional:      Appearance: He is well-developed.     Comments: On BiPaP while I was in the room this morning (has transitioned to [place] since)  Cardiovascular:     Rate and Rhythm: Normal rate and regular rhythm.     Heart sounds: No murmur heard. Pulmonary:     Effort: Pulmonary effort is normal.     Breath sounds: Wheezing present.  Abdominal:     General: Bowel sounds are normal.     Palpations: Abdomen is soft.     Tenderness: There is no abdominal tenderness.  Skin:    General: Skin is warm and dry.  Neurological:     Mental Status: He is alert.  Psychiatric:        Mood and Affect: Mood normal.        Behavior: Behavior normal.     Laboratory: Recent Labs  Lab 05/09/21 2021 05/10/21 0209  WBC 13.5* 13.7*  HGB 15.9 16.1  HCT 49.7 49.7  PLT 261 239   Recent Labs  Lab 05/09/21 2021 05/10/21 0209  NA 136 137  K 4.0 4.3  CL 103 103  CO2 24 24  BUN 10 12  CREATININE 0.68 0.69  CALCIUM 8.9 9.0  GLUCOSE 159* 210*    D-dimer 1.59, age adjusted .[place], MD 05/10/2021, 1:52 PM PGY-1, [facility] Intern pager: [phone], text pages welcome

## 2021-05-10 NOTE — ED Notes (Signed)
Pt placed on BiPAP by RT

## 2021-05-11 DIAGNOSIS — E1165 Type 2 diabetes mellitus with hyperglycemia: Secondary | ICD-10-CM | POA: Diagnosis not present

## 2021-05-11 DIAGNOSIS — J9601 Acute respiratory failure with hypoxia: Secondary | ICD-10-CM | POA: Diagnosis not present

## 2021-05-11 DIAGNOSIS — Z794 Long term (current) use of insulin: Secondary | ICD-10-CM | POA: Diagnosis not present

## 2021-05-11 DIAGNOSIS — J441 Chronic obstructive pulmonary disease with (acute) exacerbation: Secondary | ICD-10-CM | POA: Diagnosis not present

## 2021-05-11 LAB — GLUCOSE, CAPILLARY
Glucose-Capillary: 221 mg/dL — ABNORMAL HIGH (ref 70–99)
Glucose-Capillary: 227 mg/dL — ABNORMAL HIGH (ref 70–99)
Glucose-Capillary: 211 mg/dL — ABNORMAL HIGH (ref 70–99)

## 2021-05-11 LAB — CBC
HCT: 44.5 % (ref 39.0–52.0)
Hemoglobin: 14.9 g/dL (ref 13.0–17.0)
MCH: 29.8 pg (ref 26.0–34.0)
MCHC: 33.5 g/dL (ref 30.0–36.0)
MCV: 89 fL (ref 80.0–100.0)
Platelets: 251 10*3/uL (ref 150–400)
RBC: 5 MIL/uL (ref 4.22–5.81)
RDW: 13.8 % (ref 11.5–15.5)
WBC: 22.5 10*3/uL — ABNORMAL HIGH (ref 4.0–10.5)
nRBC: 0 % (ref 0.0–0.2)

## 2021-05-11 MED ORDER — PHENOL 1.4 % MT LIQD
1.0000 | OROMUCOSAL | Status: DC | PRN
  Administered 2021-05-11 – 2021-05-12 (×2): 1 via OROMUCOSAL
  Filled 2021-05-11: qty 177

## 2021-05-11 MED ORDER — IPRATROPIUM-ALBUTEROL 0.5-2.5 (3) MG/3ML IN SOLN
3.0000 mL | Freq: Three times a day (TID) | RESPIRATORY_TRACT | Status: DC
  Administered 2021-05-11 (×3): 3 mL via RESPIRATORY_TRACT
  Filled 2021-05-11 (×3): qty 3

## 2021-05-11 MED ORDER — DM-GUAIFENESIN ER 30-600 MG PO TB12
1.0000 | ORAL_TABLET | Freq: Two times a day (BID) | ORAL | Status: DC | PRN
  Administered 2021-05-11 – 2021-05-12 (×2): 1 via ORAL
  Filled 2021-05-11 (×2): qty 1

## 2021-05-11 MED ORDER — SODIUM CHLORIDE 0.9 % IV SOLN
2.0000 g | INTRAVENOUS | Status: DC
  Administered 2021-05-11 – 2021-05-12 (×2): 2 g via INTRAVENOUS
  Filled 2021-05-11 (×2): qty 20

## 2021-05-11 NOTE — Progress Notes (Signed)
Pt admitted to 5W09 from ED. Transported via stretcher. Report received from Judson Roch, [place]. Pt is AAOx4; on [place]; PIV SL; skin assessment completed with Dejah, RN. No c/o pain. Pt oriented to room and call bell instruction given. Bed is low and brakes are locked. RT called for assistance with bipap. Call bell and personal items within reach. Will continue to monitor.

## 2021-05-11 NOTE — Progress Notes (Addendum)
FPTS Brief Progress Note  S Saw patient at bedside this evening. Patient sitting upright in chair in no distress. He tells me he has "the 'monia" to which I told him that we are treating him with antibiotics. He says he is still coughing up sputum and having a sore throat,so I ordered chloraseptic spray and told him how to use it. He feels his breathing is improving and has no other complaints. I rounded with primary night RN Gerald Stabs who voiced no concerns.  O: BP 131/82 (BP Location: Right Arm)   Pulse 61   Temp 98.5 F (36.9 C) (Oral)   Resp 17   Ht '5\' 7"'$  (1.702 m)   Wt 130.4 kg   SpO2 94%   BMI 45.03 kg/m    General: Alert, no acute distress Cardio: RRR Pulm: No increased work of breathing, on 5L [place] Extremities: No peripheral edema.  Neuro: Cranial nerves grossly intact   A/P: Plan per day team  BiPAP overnight, monitor respiratory status Chloraseptic spray PRN for sore throat  - Orders reviewed. Labs for AM ordered, which was adjusted as needed.    Orvis Brill, DO 05/11/2021, 8:07 PM PGY-1, [facility] Night Resident  Please page [phone] with questions.

## 2021-05-11 NOTE — Evaluation (Signed)
Occupational Therapy Evaluation Patient Details Name: [name] [name] MRN: [id number] DOB: 02-01-1961 Today's Date: 05/11/2021    History of Present Illness 60 y.o. male presenting to ED 8/28 with dyspnea, runny nose, cough, fever and chills x4 days. Patient hypoxic with SpO2 in 80's upon arrival. Patient admitted with COPD exacerbation. PMHx significant for COPD, OSA on CPAP, HLD, HTN, CAD, depression, DMII, tobacco use disorder, CAD and Hx of NSTEMI.   Clinical Impression   PTA patient was living with his sister in a townhouse with 5 STE and 12 steps to bedroom/bathroom on 2nd floor and was grossly I with ADLs/IADLs without AD. Patient currently requiring Min guard for stand-pivot transfers, set-up assist for LB dressing and is limited by deficits listed below including decreased cardiopulmonary endurance (5L O2 via [place]), decreased balance and decreased activity tolerance and would benefit from continued acute OT services in prep for safe d/c home. OT will continue to follow acutely.      Follow Up Recommendations  No OT follow up    Equipment Recommendations  Other (comment) (Rolling walker)    Recommendations for Other Services       Precautions / Restrictions Precautions Precautions: Fall Precaution Comments: Monitor BP and SpO2 Restrictions Weight Bearing Restrictions: No      Mobility Bed Mobility Overal bed mobility: Modified Independent                  Transfers Overall transfer level: Needs assistance Equipment used: None Transfers: Sit to/from [employer] Transfers Sit to Stand: Supervision Stand pivot transfers: Min guard       General transfer comment: Supervision A for sit to stand from EOB positioned in lowest setting and Min guard for stand-pivot to recliner without AD. May benefit from use of RW.    Balance Overall balance assessment: Needs assistance Sitting-balance support: No upper extremity supported;Feet supported Sitting balance-Leahy  Scale: Good Sitting balance - Comments: No LOB with reaching outside of BOS.   Standing balance support: Single extremity supported;During functional activity Standing balance-Leahy Scale: Fair Standing balance comment: Furniture surfs, may benefit from use of RW.                           ADL either performed or assessed with clinical judgement   ADL Overall ADL's : Needs assistance/impaired Eating/Feeding: Independent                   Lower Body Dressing: Set up Lower Body Dressing Details (indicate cue type and reason): Set-up assist to don footwear seated EOB. Toilet Transfer: Hydrographic surveyor Details (indicate cue type and reason): Simulated with stand-pivot to recliner. Min guard for steadying.                 Vision Baseline Vision/History: 1 Wears glasses Ability to See in Adequate Light: 0 Adequate Patient Visual Report: No change from baseline       Perception     Praxis      Pertinent Vitals/Pain Pain Assessment: No/denies pain     Hand Dominance Right   Extremity/Trunk Assessment Upper Extremity Assessment Upper Extremity Assessment: Generalized weakness   Lower Extremity Assessment Lower Extremity Assessment: Defer to PT evaluation   Cervical / Trunk Assessment Cervical / Trunk Assessment: Other exceptions Cervical / Trunk Exceptions: Large body habitus   Communication Communication Communication: No difficulties   Cognition Arousal/Alertness: Awake/alert Behavior During Therapy: WFL for tasks assessed/performed Overall Cognitive Status: Within Functional Limits  for tasks assessed                                     General Comments  Soft BP throughout. On 5L O2 via [place] upon entry with SpO2 >94% throughout.    Exercises     Shoulder Instructions      Home Living Family/patient expects to be discharged to:: Private residence Living Arrangements: Other relatives (Sister) Available Help at  Discharge: Family;Available PRN/intermittently (Sister works 12hr nights several times weekly) Type of Home: Other(Comment) (Townhouse) Home Access: Stairs to enter [employer] of Steps: 5 Entrance Stairs-Rails: Right;Left Home Layout: Two level;Able to live on main level with bedroom/bathroom;1/2 bath on main level (No spare bedroom on main level but reports he can sleep on the couch) Alternate Level Stairs-Number of Steps: 12 Alternate Level Stairs-Rails: Right;Left Bathroom Shower/Tub: Producer, television/film/video: Standard     Home Equipment: Grab bars - tub/shower          Prior Functioning/Environment Level of Independence: Independent        Comments: I with ADLs/IADLs; has not worked since 2008, takes public transportation        OT Problem List: Decreased strength;Decreased activity tolerance;Impaired balance (sitting and/or standing);Decreased knowledge of use of DME or AE;Cardiopulmonary status limiting activity      OT Treatment/Interventions: Self-care/ADL training;Therapeutic exercise;Energy conservation;DME and/or AE instruction;Therapeutic activities;Patient/family education;Balance training    OT Goals(Current goals can be found in the care plan section) Acute Rehab OT Goals Patient Stated Goal: To get stronger OT Goal Formulation: With patient Time For Goal Achievement: 05/25/21 Potential to Achieve Goals: Good ADL Goals Pt Will Perform Grooming: with modified independence;standing Pt Will Perform Upper Body Dressing: Independently Pt Will Perform Lower Body Dressing: with modified independence;sit to/from stand Pt Will Transfer to Toilet: with modified independence;ambulating Pt Will Perform Toileting - Clothing Manipulation and hygiene: with modified independence;sit to/from stand Additional ADL Goal #1: Patient will tolerate 15 minutes of therapeutic activity indicating increased activity tolerance in prep for ADLs.  OT Frequency: Min  3X/week   Barriers to D/C: Inaccessible home environment  5 STE and 12 steps to bedroom/bathroom on 2nd level       Co-evaluation              AM-PAC OT "6 Clicks" Daily Activity     Outcome Measure Help from another person eating meals?: None Help from another person taking care of personal grooming?: A Little Help from another person toileting, which includes using toliet, bedpan, or urinal?: A Little Help from another person bathing (including washing, rinsing, drying)?: A Little Help from another person to put on and taking off regular upper body clothing?: A Little Help from another person to put on and taking off regular lower body clothing?: A Little 6 Click Score: 19   End of Session Equipment Utilized During Treatment: Gait belt;Oxygen (5L via [place]) Nurse Communication: Mobility status  Activity Tolerance: Patient tolerated treatment well Patient left: in chair;with call bell/phone within reach;with chair alarm set  OT Visit Diagnosis: Unsteadiness on feet (R26.81);Muscle weakness (generalized) (M62.81)                Time: 4782-9562 OT Time Calculation (min): 25 min Charges:  OT General Charges $OT Visit: 1 Visit OT Evaluation $OT Eval Low Complexity: 1 Low OT Treatments $Self Care/Home Management : 8-22 mins  Jim Philemon H. OTR/L Supplemental OT,  Department of rehab services [phone]  Annastacia Duba R H. 05/11/2021, 11:38 AM

## 2021-05-11 NOTE — Evaluation (Signed)
Physical Therapy Evaluation Patient Details Name: [name] [name] MRN: [id number] DOB: Aug 09, 1961 Today's Date: 05/11/2021   History of Present Illness  60 y.o. male presenting to ED 8/28 with dyspnea, runny nose, cough, fever and chills x4 days. Patient hypoxic with SpO2 in 80's upon arrival. Patient admitted with COPD exacerbation. PMHx significant for COPD, OSA on CPAP, HLD, HTN, CAD, depression, DMII, tobacco use disorder, CAD and Hx of NSTEMI.  Clinical Impression   Pt admitted secondary to problem above with deficits below. PTA patient was independent with all mobility. Pt currently requires supervision for lines and cues for slowing breathing/less talking while ambulating on 6L of oxygen (sats >90%).  Anticipate patient will benefit from PT to address problems listed below.Will continue to follow acutely to maximize functional mobility independence and safety.       Follow Up Recommendations No PT follow up    Equipment Recommendations  None recommended by PT    Recommendations for Other Services       Precautions / Restrictions Precautions Precautions: Fall Precaution Comments: Monitor BP and SpO2      Mobility  Bed Mobility               General bed mobility comments: up in recliner    Transfers Overall transfer level: Needs assistance Equipment used: None Transfers: Sit to/from Stand Sit to Stand: Supervision         General transfer comment: Supervision A for lines; no imbalance  Ambulation/Gait Ambulation/Gait assistance: Supervision Gait Distance (Feet): 120 Feet Assistive device: None Gait Pattern/deviations: WFL(Within Functional Limits)     General Gait Details: incr dyspnea as walking and talking; vc for limiting talking and pursed lip breathing  Stairs            Wheelchair Mobility    Modified Rankin (Stroke Patients Only)       Balance Overall balance assessment: Needs assistance Sitting-balance support: No upper extremity  supported;Feet supported Sitting balance-Leahy Scale: Good     Standing balance support: During functional activity;No upper extremity supported Standing balance-Leahy Scale: Fair                               Pertinent Vitals/Pain Pain Assessment: No/denies pain    Home Living Family/patient expects to be discharged to:: Private residence Living Arrangements: Other relatives (Sister) Available Help at Discharge: Family;Available PRN/intermittently (Sister works 12hr nights several times weekly) Type of Home: Other(Comment) (Townhouse) Home Access: Stairs to enter Entrance Stairs-Rails: Psychiatric nurse of Steps: 5 Home Layout: Two level;Able to live on main level with bedroom/bathroom;1/2 bath on main level (No spare bedroom on main level but reports he can sleep on the couch) Home Equipment: Grab bars - tub/shower      Prior Function Level of Independence: Independent         Comments: I with ADLs/IADLs; has not worked since [id number], takes Arts administrator Dominance   Dominant Hand: Right    Extremity/Trunk Assessment   Upper Extremity Assessment Upper Extremity Assessment: Defer to OT evaluation    Lower Extremity Assessment Lower Extremity Assessment: Overall WFL for tasks assessed    Cervical / Trunk Assessment Cervical / Trunk Assessment: Other exceptions Cervical / Trunk Exceptions: Large body habitus  Communication   Communication: No difficulties  Cognition Arousal/Alertness: Awake/alert Behavior During Therapy: WFL for tasks assessed/performed Overall Cognitive Status: Within Functional Limits for tasks assessed  General Comments General comments (skin integrity, edema, etc.): BP stable 138/74; sats >90% on 6L during ambulation    Exercises     Assessment/Plan    PT Assessment Patient needs continued PT services  PT Problem List Decreased activity  tolerance;Decreased mobility;Decreased knowledge of use of DME;Cardiopulmonary status limiting activity;Obesity       PT Treatment Interventions DME instruction;Gait training;Functional mobility training;Therapeutic activities;Therapeutic exercise;Balance training;Patient/family education    PT Goals (Current goals can be found in the Care Plan section)  Acute Rehab PT Goals Patient Stated Goal: To get stronger PT Goal Formulation: With patient Time For Goal Achievement: 05/25/21 Potential to Achieve Goals: Good    Frequency Min 3X/week   Barriers to discharge        Co-evaluation               AM-PAC PT "6 Clicks" Mobility  Outcome Measure Help needed turning from your back to your side while in a flat bed without using bedrails?: None Help needed moving from lying on your back to sitting on the side of a flat bed without using bedrails?: None Help needed moving to and from a bed to a chair (including a wheelchair)?: A Little Help needed standing up from a chair using your arms (e.g., wheelchair or bedside chair)?: A Little Help needed to walk in hospital room?: A Little Help needed climbing 3-5 steps with a railing? : A Little 6 Click Score: 20    End of Session Equipment Utilized During Treatment: Oxygen Activity Tolerance: Patient tolerated treatment well Patient left: in chair;with call bell/phone within reach;with chair alarm set Nurse Communication: Mobility status;Other (comment) (pt requesting inhaler/breathing treatment due to coughing) PT Visit Diagnosis: Difficulty in walking, not elsewhere classified (R26.2)    Time: [id number] PT Time Calculation (min) (ACUTE ONLY): 21 min   Charges:   PT Evaluation $PT Eval Low Complexity: 1 Low           [name] [name], PT Pager [phone]   [name] [name] 05/11/2021, 4:08 PM

## 2021-05-11 NOTE — Progress Notes (Signed)
PT Cancellation Note  Patient Details Name: Siyon Baggott MRN: [id number] DOB: [birth year]   Cancelled Treatment:    Reason Eval/Treat Not Completed: Patient not medically ready  Patient currently on BiPAP.   Arby Barrette, PT Pager [phone]   Jeanie Cooks Brant Peets 05/11/2021, 8:14 AM

## 2021-05-11 NOTE — Progress Notes (Signed)
RT called to give pt a PRN breathing tx.  Pt found off bipap on 6L [place] Sp02 86%.  Pt stated he felt short of breath. Pt placed back on BIPAP and PRN albuterol give. Pt encourage to leave Bipap on for the rest of the morning. Pt agreed.

## 2021-05-12 ENCOUNTER — Encounter: Payer: Self-pay | Admitting: Podiatry

## 2021-05-12 DIAGNOSIS — J9601 Acute respiratory failure with hypoxia: Secondary | ICD-10-CM | POA: Diagnosis not present

## 2021-05-12 DIAGNOSIS — J189 Pneumonia, unspecified organism: Secondary | ICD-10-CM

## 2021-05-12 DIAGNOSIS — J441 Chronic obstructive pulmonary disease with (acute) exacerbation: Secondary | ICD-10-CM | POA: Diagnosis not present

## 2021-05-12 LAB — BASIC METABOLIC PANEL
Anion gap: 8 (ref 5–15)
BUN: 15 mg/dL (ref 6–20)
CO2: 26 mmol/L (ref 22–32)
Calcium: 8.5 mg/dL — ABNORMAL LOW (ref 8.9–10.3)
Chloride: 100 mmol/L (ref 98–111)
Creatinine, Ser: 0.79 mg/dL (ref 0.61–1.24)
GFR, Estimated: >60 mL/min (ref >60)
Glucose, Bld: 300 mg/dL — ABNORMAL HIGH (ref 70–99)
Potassium: 4.1 mmol/L (ref 3.5–5.1)
Sodium: 134 mmol/L — ABNORMAL LOW (ref 135–145)

## 2021-05-12 LAB — GLUCOSE, CAPILLARY
Glucose-Capillary: 164 mg/dL — ABNORMAL HIGH (ref 70–99)
Glucose-Capillary: 235 mg/dL — ABNORMAL HIGH (ref 70–99)
Glucose-Capillary: 267 mg/dL — ABNORMAL HIGH (ref 70–99)
Glucose-Capillary: 118 mg/dL — ABNORMAL HIGH (ref 70–99)

## 2021-05-12 MED ORDER — INSULIN GLARGINE-YFGN 100 UNIT/ML ~~LOC~~ SOLN
30.0000 [IU] | Freq: Two times a day (BID) | SUBCUTANEOUS | Status: DC
  Administered 2021-05-12: 30 [IU] via SUBCUTANEOUS
  Filled 2021-05-12 (×3): qty 0.3

## 2021-05-12 MED ORDER — IPRATROPIUM-ALBUTEROL 0.5-2.5 (3) MG/3ML IN SOLN
3.0000 mL | Freq: Two times a day (BID) | RESPIRATORY_TRACT | Status: DC
  Administered 2021-05-12 – 2021-05-14 (×5): 3 mL via RESPIRATORY_TRACT
  Filled 2021-05-12 (×5): qty 3

## 2021-05-12 MED ORDER — METFORMIN HCL 500 MG PO TABS
1000.0000 mg | ORAL_TABLET | Freq: Two times a day (BID) | ORAL | Status: DC
  Administered 2021-05-12 – 2021-05-14 (×4): 1000 mg via ORAL
  Filled 2021-05-12 (×4): qty 2

## 2021-05-12 MED ORDER — EMPAGLIFLOZIN 25 MG PO TABS
25.0000 mg | ORAL_TABLET | Freq: Every day | ORAL | Status: DC
  Administered 2021-05-12 – 2021-05-14 (×3): 25 mg via ORAL
  Filled 2021-05-12 (×4): qty 1

## 2021-05-12 NOTE — Progress Notes (Signed)
Occupational Therapy Treatment Patient Details Name: [name] [name] MRN: [id number] DOB: 19-Apr-1961 Today's Date: 05/12/2021    History of present illness 60 y.o. male presenting to ED 8/28 with dyspnea, runny nose, cough, fever and chills x4 days. Patient hypoxic with SpO2 in 80's upon arrival. Patient admitted with COPD exacerbation. PMHx significant for COPD, OSA on CPAP, HLD, HTN, CAD, depression, DMII, tobacco use disorder, CAD and Hx of NSTEMI.   OT comments  Patient met seated EOB at conclusion of breakfast meal. Reports  cough and mild soreness in throat. RN aware. Patient completed 3/3 grooming tasks standing at sink level with supervision A for line management and functional mobility up to 235ft in hallway without AD and supervision A. SpO2 100% on 6L O2 via [place] on portable tank, titrated down to 4L with SpO2 remaining above 95%. Session concluded with patient seated in recliner with call bell within reach and all needs met. OT will continue to follow acutely.    Follow Up Recommendations  No OT follow up    Equipment Recommendations  None recommended by OT    Recommendations for Other Services      Precautions / Restrictions Precautions Precautions: Fall Precaution Comments: Monitor BP and SpO2 Restrictions Weight Bearing Restrictions: No       Mobility Bed Mobility               General bed mobility comments: Seated EOB upon entry.    Transfers Overall transfer level: Needs assistance Equipment used: None Transfers: Sit to/from Stand Sit to Stand: Supervision         General transfer comment: Supervision A for line management    Balance Overall balance assessment: Needs assistance Sitting-balance support: No upper extremity supported;Feet supported Sitting balance-Leahy Scale: Good     Standing balance support: During functional activity;No upper extremity supported Standing balance-Leahy Scale: Fair                             ADL  either performed or assessed with clinical judgement   ADL Overall ADL's : Needs assistance/impaired     Grooming: Supervision/safety;Standing Grooming Details (indicate cue type and reason): Supervision A for line management             Lower Body Dressing: Supervision/safety Lower Body Dressing Details (indicate cue type and reason): Supervision A to don footwear seated EOB.                     Vision       Perception     Praxis      Cognition Arousal/Alertness: Awake/alert Behavior During Therapy: WFL for tasks assessed/performed Overall Cognitive Status: Within Functional Limits for tasks assessed                                          Exercises     Shoulder Instructions       General Comments      Pertinent Vitals/ Pain       Pain Assessment: 0-10 Pain Score: 2  Pain Location: Sore throat Pain Descriptors / Indicators: Sore Pain Intervention(s): Monitored during session  Home Living  Prior Functioning/Environment              Frequency  Min 3X/week        Progress Toward Goals  OT Goals(current goals can now be found in the care plan section)     Acute Rehab OT Goals Patient Stated Goal: To get stronger OT Goal Formulation: With patient Time For Goal Achievement: 05/25/21 Potential to Achieve Goals: Good ADL Goals Pt Will Perform Grooming: with modified independence;standing Pt Will Perform Upper Body Dressing: Independently Pt Will Perform Lower Body Dressing: with modified independence;sit to/from stand Pt Will Transfer to Toilet: with modified independence;ambulating Pt Will Perform Toileting - Clothing Manipulation and hygiene: with modified independence;sit to/from stand Additional ADL Goal #1: Patient will tolerate 15 minutes of therapeutic activity indicating increased activity tolerance in prep for ADLs.  Plan Discharge plan remains  appropriate;Frequency remains appropriate    Co-evaluation                 AM-PAC OT "6 Clicks" Daily Activity     Outcome Measure   Help from another person eating meals?: None Help from another person taking care of personal grooming?: A Little Help from another person toileting, which includes using toliet, bedpan, or urinal?: A Little Help from another person bathing (including washing, rinsing, drying)?: A Little Help from another person to put on and taking off regular upper body clothing?: A Little Help from another person to put on and taking off regular lower body clothing?: A Little 6 Click Score: 19    End of Session Equipment Utilized During Treatment: Gait belt;Oxygen (5-6L via [place])  OT Visit Diagnosis: Unsteadiness on feet (R26.81);Muscle weakness (generalized) (M62.81)   Activity Tolerance Patient tolerated treatment well   Patient Left in bed;with call bell/phone within reach;with bed alarm set;Other (comment) (Seated EOB)   Nurse Communication Mobility status        Time: 1029-1050 OT Time Calculation (min): 21 min  Charges: OT General Charges $OT Visit: 1 Visit OT Treatments $Self Care/Home Management : 8-22 mins  Kathrin Folden H. OTR/L Supplemental OT, Department of rehab services [phone]   Eldo Umanzor R H. 05/12/2021, 10:53 AM

## 2021-05-12 NOTE — Progress Notes (Signed)
Subjective: [name] [name] is a pleasant 60 y.o. male patient seen for diabetic foot care today painful thick toenails that are difficult to trim. Pain interferes with ambulation. Aggravating factors include wearing enclosed shoe gear. Pain is relieved with periodic professional debridement.  PCP is Lind Covert, MD. Last visit was:02/17/2021  Patient states he has some insect bites on his legs and would like to know what to apply to them. He voices no new pedal problems on today's visit.  Allergies  Allergen Reactions   Shellfish Allergy Nausea And Vomiting    OYSTERS    Objective: Physical Exam  General: [name] [name] is a pleasant 60 y.o. Caucasian male, morbidly obese in NAD. AAO x 3.   Vascular:  Capillary refill time to digits immediate b/l. Palpable pedal pulses b/l LE. Pedal hair sparse. Lower extremity skin temperature gradient within normal limits. No pain with calf compression b/l.  Dermatological:  Pedal skin with normal turgor, texture and tone bilaterally. No open wounds bilaterally. No interdigital macerations bilaterally. Toenails 1-5 b/l elongated, discolored, dystrophic, thickened, crumbly with subungual debris and tenderness to dorsal palpation.  Multiple areas of insect bites on both feet and legs Scab intact. No erythema, no edema, no drainage, no fluctuance.  Musculoskeletal:  Normal muscle strength 5/5 to all lower extremity muscle groups bilaterally. No pain crepitus or joint limitation noted with ROM b/l. No gross bony deformities bilaterally.  Neurological:  Protective sensation diminished with 10g monofilament b/l.  Assessment and Plan:  1. Pain due to onychomycosis of toenails of both feet   2. Insect bite of foot, unspecified laterality, initial encounter     -Examined patient. -He was advised to apply triple antibiotic ointment to the insect bites.Marland Kitchen He related understanding. -Continue diabetic foot care principles. -Patient to continue soft,  supportive shoe gear daily. -Toenails 1-5 b/l were debrided in length and girth with sterile nail nippers and dremel without iatrogenic bleeding.  -Patient to report any pedal injuries to medical professional immediately. -Patient/POA to call should there be question/concern in the interim.  Return in about 3 months (around 08/07/2021).  Marzetta Board, DPM

## 2021-05-12 NOTE — Progress Notes (Signed)
Family Medicine Teaching Service Daily Progress Note Intern Pager: [phone]  Patient name: [name] [name] Medical record number: [id number] Date of birth: 1961/02/03 Age: 60 y.o. Gender: male  Primary Care Provider: Lind Covert, MD Consultants: None  Code Status: Full  Pt Overview and Major Events to Date:  8/29 admitted to [facility] E[facility] for COPD exacerbation 8/30 sensation RML consolidation  Assessment and Plan:  [name] [name] is a 60 year old male presenting with dyspnea. Past medical history significant for COPD, hyperlipidemia, hypertension, CAD, depression, DM 2, CAD with history of NSTEMI, history of alcohol use disorder.   Acute hypoxic respiratory failure secondary to COPD exacerbation versus PNA Patient was placed back on BiPAP 40% from nasal cannula.  He is currently satting 94% however I cannot find a period in time when he was satting below 88% and I am unsure why he was placed back on BiPAP.  He still has a sore throat and a cough but denies any worsening of shortness of breath. -As per floor routine -Carb modified diet -Continuous cardiac and pulse ox monitoring -Azithromycin 500 mg day 3 of 3-completed today -Ceftriaxone 2g day 2 of 5 (complete on 9/5) continue to try to wean off -BiPAP, goal O2 saturation >88% -Duonebs every 6 hours - PO prednisone (8/29-9/2) -lovenox for ppx   DM2 Insulin dependent  Current CBGs: 227 > 211 > 164. Home meds: Lantus 45U 2x daily, Jardiance '25mg'$ , NovoLog 30 units twice daily, metformin 1000 mg twice daily, Victoza 1.8 mg injection once weekly -Jardiance and metformin can be restarted after BMP -Semglee increased to 30 U 2 times daily -NovoLog 6 units 3 times daily with meals -Resistant sliding scale insulin -Monitor CBGs  CAD, NSTEMI status post PCI stent x3 Patient denies chest pain again today.  Currently he is on dual antiplatelet therapy.  Echo shows EF 60 to 65% with mild left ventricular hypertrophy and grade 1 diastolic  dysfunction -Continue DAPT -Cardiac monitoring  Hypertension Blood pressure currently: 106/55.  Cynril for soft blood pressures.  Continue to hold.  HLD Atorvastatin 40 mg daily  Back pain chronic and stable Tylenol as needed for back pain  History of tobacco use disorder Home medication: Wellbutrin 150 mg daily  Depression Home medication: Celexa 20 mg daily  OSA Uses CPAP at night at home Can continue with BiPAP if patient is still on nasal cannula and he needs more oxygen at night  FEN/GI: carb modified  PPx: lovenox  Dispo:Home in 1-2 days pending clinical improvement.   Subjective:  Patient reports that he still coughing and has a sore throat.  He has shortness of breath but is not as bad as when he first arrived.  Denies any other complaints.  Objective: Temp:  [97.1 F (36.2 C)-98.5 F (36.9 C)] 98.4 F (36.9 C) (08/31 1139) Pulse Rate:  [53-89] 80 (08/31 1139) Resp:  [15-20] 18 (08/31 1139) BP: (106-146)/(54-82) 106/55 (08/31 1139) SpO2:  [93 %-97 %] 94 % (08/31 1139) FiO2 (%):  [40 %] 40 % (08/31 0817) Physical Exam Vitals reviewed.  Constitutional:      Appearance: He is well-developed. He is obese.  Cardiovascular:     Rate and Rhythm: Normal rate and regular rhythm.     Heart sounds: No murmur heard. Pulmonary:     Effort: Pulmonary effort is normal. No tachypnea.     Comments: BiPaP in place when I saw him this morning  Abdominal:     General: Bowel sounds are normal.     Palpations:  Abdomen is soft.     Tenderness: There is no abdominal tenderness.  Skin:    General: Skin is warm and dry.  Neurological:     Mental Status: He is alert.  Psychiatric:        Mood and Affect: Mood normal.        Behavior: Behavior normal.     Laboratory: Recent Labs  Lab 05/09/21 2021 05/10/21 0209 05/10/21 1532 05/11/21 0056  WBC 13.5* 13.7*  --  22.5*  HGB 15.9 16.1 15.3 14.9  HCT 49.7 49.7 45.0 44.5  PLT 261 239  --  251   Recent Labs  Lab  05/09/21 2021 05/10/21 0209 05/10/21 1532 05/12/21 1108  NA 136 137 136 134*  K 4.0 4.3 4.0 4.1  CL 103 103  --  100  CO2 24 24  --  26  BUN 10 12  --  15  CREATININE 0.68 0.69  --  0.79  CALCIUM 8.9 9.0  --  8.5*  GLUCOSE 159* 210*  --  300Erskine Emery, MD 05/12/2021, 1:47 PM PGY-1, [facility] Intern pager: [phone], text pages welcome

## 2021-05-12 NOTE — Progress Notes (Addendum)
FPTS Brief Progress Note  S Saw patient at bedside this evening. Patient snoring was sleeping comfortably, with BiPap in place.  Rounded with primary night RN- no concerns.    O: BP 137/74 (BP Location: Right Wrist)   Pulse 65   Temp 98 F (36.7 C) (Oral)   Resp (!) 21   Ht '5\' 7"'$  (1.702 m)   Wt 130.4 kg   SpO2 96%   BMI 45.03 kg/m    General: Sleeping in bed Pulm: Normal work of breathing, snoring   A/P: Plan per day team  Bipap overnight - Orders reviewed. Labs for AM ordered, which was adjusted as needed.   Orvis Brill, DO 05/12/2021, 7:55 PM PGY-1, [facility] Night Resident  Please page [phone] with questions.

## 2021-05-12 NOTE — TOC Initial Note (Signed)
Transition of Care [facility]) - Initial/Assessment Note    Patient Details  Name: [name] [name] MRN: [id number] Date of Birth: Dec 22, 1960  [facility] CM/SW Contact:    Carles Collet, RN Phone Number: 05/12/2021, 1:51 PM  Clinical Narrative:         Patient admitted w COPD exacerbation.  Patient verified he had no home oxygen requirement prior to admission. Currently weaning oxygen. CM will continue to follow for DC needs.            Expected Discharge Plan: Home/Self Care Barriers to Discharge: Continued Medical Work up   Patient Goals and CMS Choice        Expected Discharge Plan and Services Expected Discharge Plan: Home/Self Care   Discharge Planning Services: CM Consult   Living arrangements for the past 2 months: Apartment                                      Prior Living Arrangements/Services Living arrangements for the past 2 months: Apartment Lives with:: Siblings                   Activities of Daily Living Home Assistive Devices/Equipment: CPAP, Eyeglasses ADL Screening (condition at time of admission) Patient's cognitive ability adequate to safely complete daily activities?: Yes Is the patient deaf or have difficulty hearing?: No Does the patient have difficulty seeing, even when wearing glasses/contacts?: No Does the patient have difficulty concentrating, remembering, or making decisions?: No Patient able to express need for assistance with ADLs?: Yes Does the patient have difficulty dressing or bathing?: No Independently performs ADLs?: Yes (appropriate for developmental age) Does the patient have difficulty walking or climbing stairs?: No Weakness of Legs: None Weakness of Arms/Hands: None  Permission Sought/Granted                  Emotional Assessment              Admission diagnosis:  COPD (chronic obstructive pulmonary disease) ([place]) [J44.9] Hypoxia [R09.02] COPD exacerbation ([place]) [J44.1] Patient Active  Problem List   Diagnosis Date Noted   Hypoxia    COPD exacerbation ([place]) 05/09/2021   Colon polyps 02/18/2020   Antiplatelet or antithrombotic long-term use 12/23/2019   Mild nonproliferative diabetic retinopathy of both eyes without macular edema associated with type 2 diabetes mellitus ([place]) 04/30/2019   Depression, acute 02/17/2016   Hearing difficulty 12/10/2014   COPD, moderate ([place]) 11/22/2014   Acute hypoxemic respiratory failure ([place]) 11/22/2014   Angina, class II ([place]) 08/01/2014   H/O NSTEMI (non-ST elevated myocardial infarction) 11/07/2013   Tobacco abuse 08/24/2012   Obesity 07/31/2012   Type 2 diabetes mellitus with hyperglycemia, with long-term current use of insulin ([place]) 06/05/2012   CAD S/P percutaneous coronary angioplasty 01/21/2012   OSA on CPAP 01/19/2012   Essential hypertension 01/18/2012   Hyperlipidemia with target LDL less than 70 01/18/2012   PCP:  Lind Covert, MD Pharmacy:   [facility] [id number] - [place], [place] - [address] [place] [address] Phone: [phone] Fax: [phone]     Social Determinants of Health (SDOH) Interventions    Readmission Risk Interventions No flowsheet data found.

## 2021-05-13 DIAGNOSIS — J189 Pneumonia, unspecified organism: Secondary | ICD-10-CM

## 2021-05-13 LAB — CBC
HCT: 45.4 % (ref 39.0–52.0)
Hemoglobin: 15.1 g/dL (ref 13.0–17.0)
MCH: 30 pg (ref 26.0–34.0)
MCHC: 33.3 g/dL (ref 30.0–36.0)
MCV: 90.1 fL (ref 80.0–100.0)
Platelets: 198 10*3/uL (ref 150–400)
RBC: 5.04 MIL/uL (ref 4.22–5.81)
RDW: 13.8 % (ref 11.5–15.5)
WBC: 15.4 10*3/uL — ABNORMAL HIGH (ref 4.0–10.5)
nRBC: 0 % (ref 0.0–0.2)

## 2021-05-13 LAB — BASIC METABOLIC PANEL
Anion gap: 8 (ref 5–15)
BUN: 15 mg/dL (ref 6–20)
CO2: 25 mmol/L (ref 22–32)
Calcium: 8.8 mg/dL — ABNORMAL LOW (ref 8.9–10.3)
Chloride: 104 mmol/L (ref 98–111)
Creatinine, Ser: 0.61 mg/dL (ref 0.61–1.24)
GFR, Estimated: >60 mL/min (ref >60)
Glucose, Bld: 109 mg/dL — ABNORMAL HIGH (ref 70–99)
Potassium: 3.6 mmol/L (ref 3.5–5.1)
Sodium: 137 mmol/L (ref 135–145)

## 2021-05-13 LAB — GLUCOSE, CAPILLARY
Glucose-Capillary: 98 mg/dL (ref 70–99)
Glucose-Capillary: 164 mg/dL — ABNORMAL HIGH (ref 70–99)
Glucose-Capillary: 248 mg/dL — ABNORMAL HIGH (ref 70–99)
Glucose-Capillary: 169 mg/dL — ABNORMAL HIGH (ref 70–99)

## 2021-05-13 LAB — MRSA NEXT GEN BY PCR, NASAL: MRSA by PCR Next Gen: NOT DETECTED

## 2021-05-13 MED ORDER — ORAL CARE MOUTH RINSE
15.0000 mL | Freq: Two times a day (BID) | OROMUCOSAL | Status: DC
  Administered 2021-05-13 – 2021-05-14 (×3): 15 mL via OROMUCOSAL

## 2021-05-13 MED ORDER — INSULIN GLARGINE-YFGN 100 UNIT/ML ~~LOC~~ SOLN
26.0000 [IU] | Freq: Two times a day (BID) | SUBCUTANEOUS | Status: DC
  Administered 2021-05-13 (×2): 26 [IU] via SUBCUTANEOUS
  Filled 2021-05-13 (×5): qty 0.26

## 2021-05-13 MED ORDER — DM-GUAIFENESIN ER 30-600 MG PO TB12
1.0000 | ORAL_TABLET | Freq: Two times a day (BID) | ORAL | Status: DC
  Administered 2021-05-13 – 2021-05-14 (×3): 1 via ORAL
  Filled 2021-05-13 (×3): qty 1

## 2021-05-13 MED ORDER — DM-GUAIFENESIN ER 30-600 MG PO TB12: 1.0000 | ORAL_TABLET | Freq: Two times a day (BID) | ORAL | Status: DC

## 2021-05-13 MED ORDER — CEFDINIR 300 MG PO CAPS
300.0000 mg | ORAL_CAPSULE | Freq: Two times a day (BID) | ORAL | Status: DC
  Administered 2021-05-13 – 2021-05-14 (×3): 300 mg via ORAL
  Filled 2021-05-13 (×3): qty 1

## 2021-05-13 NOTE — Progress Notes (Signed)
Physical Therapy Treatment and Discharge Patient Details Name: [name] [name] MRN: [id number] DOB: Sep 12, 1961 Today's Date: 05/13/2021    History of Present Illness 60 y.o. male presenting to ED 8/28 with dyspnea, runny nose, cough, fever and chills x4 days. Patient hypoxic with SpO2 in 80's upon arrival. Patient admitted with COPD exacerbation. PMHx significant for COPD, OSA on CPAP, HLD, HTN, CAD, depression, DMII, tobacco use disorder, CAD and Hx of NSTEMI.    PT Comments    Patient mobilizing independently in room and hallway. On room air with sats 92-100% (higher when walking). All goals met and discharge from PT.     Follow Up Recommendations  No PT follow up     Equipment Recommendations  None recommended by PT    Recommendations for Other Services       Precautions / Restrictions Precautions Precautions: Fall Precaution Comments: Monitor BP and SpO2    Mobility  Bed Mobility               General bed mobility comments: up in recliner    Transfers Overall transfer level: Needs assistance Equipment used: None Transfers: Sit to/from Stand Sit to Stand: Independent            Ambulation/Gait Ambulation/Gait assistance: Independent Gait Distance (Feet): 440 Feet Assistive device: None Gait Pattern/deviations: WFL(Within Functional Limits)     General Gait Details: no imbalance; sats 100% on room air   Stairs             Wheelchair Mobility    Modified Rankin (Stroke Patients Only)       Balance Overall balance assessment: Needs assistance Sitting-balance support: No upper extremity supported;Feet supported Sitting balance-Leahy Scale: Good     Standing balance support: During functional activity;No upper extremity supported Standing balance-Leahy Scale: Good                              Cognition Arousal/Alertness: Awake/alert Behavior During Therapy: WFL for tasks assessed/performed Overall Cognitive Status: Within  Functional Limits for tasks assessed                                        Exercises      General Comments General comments (skin integrity, edema, etc.): on room air at rest 92%      Pertinent Vitals/Pain Pain Assessment: No/denies pain    Home Living                      Prior Function            PT Goals (current goals can now be found in the care plan section) Acute Rehab PT Goals Patient Stated Goal: To get stronger Time For Goal Achievement: 05/25/21 Potential to Achieve Goals: Good Progress towards PT goals: Goals met and updated - see care plan    Frequency    Min 3X/week      PT Plan Current plan remains appropriate    Co-evaluation              AM-PAC PT "6 Clicks" Mobility   Outcome Measure  Help needed turning from your back to your side while in a flat bed without using bedrails?: None Help needed moving from lying on your back to sitting on the side of a flat bed without using bedrails?: None Help needed moving  to and from a bed to a chair (including a wheelchair)?: None Help needed standing up from a chair using your arms (e.g., wheelchair or bedside chair)?: None Help needed to walk in hospital room?: None Help needed climbing 3-5 steps with a railing? : None 6 Click Score: 24    End of Session   Activity Tolerance: Patient tolerated treatment well Patient left: in chair;with call bell/phone within reach Nurse Communication: Mobility status;Other (comment) (ambulatory sats and note completed) PT Visit Diagnosis: Difficulty in walking, not elsewhere classified (R26.2)   PT Discharge Note  Patient is being discharged from PT services secondary to:  Goals met and no further therapy needs identified.  Please see latest Therapy Progress Note for current level of functioning and progress toward goals.  Progress and discharge plan and discussed with patient/caregiver and they  Agree   Time: 4132-4401 PT  Time Calculation (min) (ACUTE ONLY): 11 min  Charges:  $Gait Training: 8-22 mins                      Arby Barrette, PT Pager [phone]    Rexanne Mano 05/13/2021, 3:43 PM

## 2021-05-13 NOTE — Progress Notes (Signed)
FPTS Interim Progress Note  S: Went to see patient and started night rounds.  Patient with no concerns or complaints.  Doing well on room air.  O: BP (!) 148/62 (BP Location: Left Arm)   Pulse 74   Temp 99.1 F (37.3 C) (Oral)   Resp (!) 22   Ht '5\' 7"'$  (1.702 m)   Wt 130.4 kg   SpO2 99%   BMI 45.03 kg/m   General: Alert and oriented in no apparent distress Heart: Regular rate and rhythm with no murmurs appreciated Lungs: No respiratory distress on room air, some fine crackles present in bilateral bases. Extremities: No lower extremity edema  A/P: 60 year old male that originally came in for acute respiratory distress and is now back on room air which is his baseline.  His ceftriaxone has been transitioned to cefdinir.  He has no concerns or complaints tonight.  For additional details see day team note.  Lurline Del, DO 05/13/2021, 9:28 PM PGY-3, [facility] Service pager [phone]

## 2021-05-13 NOTE — Progress Notes (Signed)
SATURATION QUALIFICATIONS: (This note is used to comply with regulatory documentation for home oxygen)  Patient Saturations on Room Air at Rest = 92%  Patient Saturations on Room Air while Ambulating = 100%  Patient Saturations on -- Liters of oxygen while Ambulating = --%    Patient did not require oxygen to maintain sats >87% during functional mobility/ambulation   Arby Barrette, PT Pager [phone]

## 2021-05-13 NOTE — Progress Notes (Signed)
Family Medicine Teaching Service Daily Progress Note Intern Pager: [phone]  Patient name: [name] [name] Medical record number: [id number] Date of birth: 1960/12/08 Age: 60 y.o. Gender: male  Primary Care Provider: Lind Covert, MD Consultants: None Code Status: Full  Pt Overview and Major Events to Date:  8/29 admitted to [facility] for COPD exacerbation 8/29 CTA showing RML consolidation  Assessment and Plan: [name] [name] is a 60 year old male presenting with dyspnea.  Past medical history significant for COPD, hyperlipidemia, hypertension, CAD, depression, type 2 diabetes, CAD with a history of NSTEMI, history of alcohol use disorder.  Acute hypoxic respiratory failure secondary to CAP vs COPD exacerbation CXR negative with CTPE showing RML consolidation. Started and completed Azithromycin (8/29-8/31). On CTX (8/30-) as well as Prednisone 40 mg (8/30-). Patient's SoB is continuing to improve, though he is complaining of persistent cough. Sating 92% of 3L [place] yesterday, 95% on BiPAP overnight.  -Continue to wean O2 -Goal O2 sat greater than 88% -Switch CTX to Cefdinir (Stop after 9/3 dose), 5 d total -Continue prednisone 40 mg (stop after 9/2 dose), 5 d total -Duonebs q6hrs -Schedule Mucinex BID given persistent cough -DVT prophylaxis with Lovenox  Type 2 diabetes, insulin-dependent Current CBGs: 235->267->118->98 fasting Home meds: Lantus 45 units 2 times daily, Jardiance 25 mg, NovoLog 30 units twice daily, metformin 1000 mg twice daily, Victoza 1.8 mg injection once weekly. -Jardiance and metformin restarted --Decrease Semglee from 30 to 26 units 2 times daily, given low fasting CBG and likely decrease in CBG following cessation of prednisone --NovoLog 6 units 3 times daily with meals - Resistant sliding scale insulin - Monitor CBGs  CAD, NSTEMI status post PCI stent x3 Patient continues to deny chest pain.  Currently he is on dual antiplatelet therapy.  Echo shows EF 60 to 65%  with mild left ventricular hypertrophy and grade 1 diastolic dysfunction. - Continue DAPT --Discontinue cardiac monitoring  Hypertension Continue to hold home antihypertensives  Hyperlipidemia Atorvastatin 40 mg daily  Back pain chronic and stable Tylenol as needed for back pain  History of tobacco use disorder Home medication: Wellbutrin 150 mg daily  Depression  Home medication: Celexa 20 mg daily  Obstructive sleep apnea Uses CPAP at night at home - Continue with BiPAP overnight as needed  FEN/GI: Carb modified PPx: Lovenox Dispo: home in 1-2 days pending continued improvement  Subjective:  Patient reports continuing improvement in SoB but complains of persistent cough. He is able to speak in full sentences. Denies CP, N/V, diarrhea.    Objective: Temp:  [97.6 F (36.4 C)-98.5 F (36.9 C)] 97.7 F (36.5 C) (09/01 0732) Pulse Rate:  [55-80] 55 (09/01 0732) Resp:  [15-25] 18 (09/01 0732) BP: (106-146)/(48-90) 111/90 (09/01 0732) SpO2:  [91 %-99 %] 94 % (09/01 0732) Physical Exam Vitals reviewed.  HENT:     Head: Normocephalic and atraumatic.  Cardiovascular:     Rate and Rhythm: Regular rhythm.     Heart sounds: No murmur heard. Pulmonary:     Comments: Notable increased work of breathing, reportedly improved from previous days. Lungs with scattered crackles and wheezes.   Abdominal:     General: Bowel sounds are normal.     Palpations: Abdomen is soft.     Tenderness: There is no abdominal tenderness.  Neurological:     Mental Status: He is alert.   Laboratory: Recent Labs  Lab 05/10/21 0209 05/10/21 1532 05/11/21 0056 05/13/21 0329  WBC 13.7*  --  22.5* 15.4*  HGB 16.1 15.3 14.9  15.1  HCT 49.7 45.0 44.5 45.4  PLT 239  --  251 198   Recent Labs  Lab 05/10/21 0209 05/10/21 1532 05/12/21 1108 05/13/21 0329  NA 137 136 134* 137  K 4.3 4.0 4.1 3.6  CL 103  --  100 104  CO2 24  --  26 25  BUN 12  --  15 15  CREATININE 0.69  --  0.79 0.61   CALCIUM 9.0  --  8.5* 8.8*  GLUCOSE 210*  --  300* 109*    Imaging/Diagnostic Tests: No new imaging or tests.   Corky Sox PGY-1, Psychiatry [place] Intern pager: [phone], text pages welcome

## 2021-05-13 NOTE — Progress Notes (Signed)
Patient was up after breakfast and in chair, on room air at 92% until he started becoming tired in afternoon, saturation at 87%.  He returned to bed for a nap and was palced back on [place] at 2L.

## 2021-05-14 ENCOUNTER — Other Ambulatory Visit: Payer: Self-pay | Admitting: Family Medicine

## 2021-05-14 ENCOUNTER — Other Ambulatory Visit (HOSPITAL_COMMUNITY): Payer: Self-pay

## 2021-05-14 DIAGNOSIS — I2510 Atherosclerotic heart disease of native coronary artery without angina pectoris: Secondary | ICD-10-CM

## 2021-05-14 DIAGNOSIS — Z9861 Coronary angioplasty status: Secondary | ICD-10-CM

## 2021-05-14 DIAGNOSIS — I10 Essential (primary) hypertension: Secondary | ICD-10-CM

## 2021-05-14 LAB — BASIC METABOLIC PANEL
Anion gap: 8 (ref 5–15)
BUN: 19 mg/dL (ref 6–20)
CO2: 28 mmol/L (ref 22–32)
Calcium: 9.1 mg/dL (ref 8.9–10.3)
Chloride: 99 mmol/L (ref 98–111)
Creatinine, Ser: 0.78 mg/dL (ref 0.61–1.24)
GFR, Estimated: >60 mL/min (ref >60)
Glucose, Bld: 148 mg/dL — ABNORMAL HIGH (ref 70–99)
Potassium: 3.6 mmol/L (ref 3.5–5.1)
Sodium: 135 mmol/L (ref 135–145)

## 2021-05-14 LAB — GLUCOSE, CAPILLARY
Glucose-Capillary: 102 mg/dL — ABNORMAL HIGH (ref 70–99)
Glucose-Capillary: 176 mg/dL — ABNORMAL HIGH (ref 70–99)

## 2021-05-14 MED ORDER — CEFDINIR 300 MG PO CAPS
300.0000 mg | ORAL_CAPSULE | Freq: Two times a day (BID) | ORAL | 0 refills | Status: DC
  Filled 2021-05-14: qty 3, 2d supply, fill #0

## 2021-05-14 MED ORDER — MUCUS RELIEF DM 30-600 MG PO TB12
1.0000 | ORAL_TABLET | Freq: Two times a day (BID) | ORAL | 0 refills | Status: DC | PRN
Start: 2021-05-14 — End: 2021-05-18
  Filled 2021-05-14: qty 30, 15d supply, fill #0

## 2021-05-14 NOTE — Progress Notes (Signed)
Occupational Therapy Treatment Patient Details Name: [name] [name] MRN: [id number] DOB: 11-16-1960 Today's Date: 05/14/2021    History of present illness 60 y.o. male presenting to ED 8/28 with dyspnea, runny nose, cough, fever and chills x4 days. Patient hypoxic with SpO2 in 80's upon arrival. Patient admitted with COPD exacerbation. PMHx significant for COPD, OSA on CPAP, HLD, HTN, CAD, depression, DMII, tobacco use disorder, CAD and Hx of NSTEMI.   OT comments  Pt progressing towards acute OT goals. Pt reports he is awaiting d/c home today. Focus of session was energy conservation education as related into transitioning back into ADL routine at home. D/c plan remains appropriate.   Follow Up Recommendations  No OT follow up    Equipment Recommendations  None recommended by OT    Recommendations for Other Services      Precautions / Restrictions Precautions Precautions: Fall Precaution Comments: Monitor BP and SpO2 Restrictions Weight Bearing Restrictions: No       Mobility Bed Mobility               General bed mobility comments: up in recliner    Transfers Overall transfer level: Needs assistance Equipment used: None             General transfer comment: Pt reports he has been up to the bathroom and back today with no mobility issies.    Balance Overall balance assessment: Needs assistance Sitting-balance support: No upper extremity supported;Feet supported Sitting balance-Leahy Scale: Good                                     ADL either performed or assessed with clinical judgement   ADL Overall ADL's : Needs assistance/impaired                                       General ADL Comments: Session focused on energy conservation as related to transistioning bakc into ADL routine at home. Pt on RA, VSS. Pt in recliner at start and end of session.     Vision       Perception     Praxis      Cognition  Arousal/Alertness: Awake/alert Behavior During Therapy: WFL for tasks assessed/performed Overall Cognitive Status: Within Functional Limits for tasks assessed                                          Exercises     Shoulder Instructions       General Comments      Pertinent Vitals/ Pain       Pain Assessment: Faces Faces Pain Scale: Hurts a little bit Pain Location: Sore throat Pain Descriptors / Indicators: Sore Pain Intervention(s): Monitored during session  Home Living                                          Prior Functioning/Environment              Frequency  Min 3X/week        Progress Toward Goals  OT Goals(current goals can now be found in the care plan section)  Acute Rehab OT Goals Patient Stated Goal: To get stronger OT Goal Formulation: With patient Time For Goal Achievement: 05/25/21 Potential to Achieve Goals: Good ADL Goals Pt Will Perform Grooming: with modified independence;standing Pt Will Perform Upper Body Dressing: Independently Pt Will Perform Lower Body Dressing: with modified independence;sit to/from stand Pt Will Transfer to Toilet: with modified independence;ambulating Pt Will Perform Toileting - Clothing Manipulation and hygiene: with modified independence;sit to/from stand Additional ADL Goal #1: Patient will tolerate 15 minutes of therapeutic activity indicating increased activity tolerance in prep for ADLs.  Plan Discharge plan remains appropriate;Frequency remains appropriate    Co-evaluation                 AM-PAC OT "6 Clicks" Daily Activity     Outcome Measure   Help from another person eating meals?: None Help from another person taking care of personal grooming?: None Help from another person toileting, which includes using toliet, bedpan, or urinal?: A Little Help from another person bathing (including washing, rinsing, drying)?: A Little Help from another person to put  on and taking off regular upper body clothing?: None Help from another person to put on and taking off regular lower body clothing?: A Little 6 Click Score: 21    End of Session    OT Visit Diagnosis: Unsteadiness on feet (R26.81);Muscle weakness (generalized) (M62.81)   Activity Tolerance Patient tolerated treatment well   Patient Left in chair;with call bell/phone within reach   Nurse Communication          Time: 1150-1201 OT Time Calculation (min): 11 min  Charges: OT General Charges $OT Visit: 1 Visit OT Treatments $Self Care/Home Management : 8-22 mins  Tyrone Schimke, OT Acute Rehabilitation Services Pager: [phone] Office: [phone]    Hortencia Pilar 05/14/2021, 12:11 PM

## 2021-05-14 NOTE — TOC Transition Note (Signed)
Transition of Care [facility]) - CM/SW Discharge Note   Patient Details  Name: [name] [name] MRN: [id number] Date of Birth: February 13, 1961  [facility] CM/SW Contact:  Benard Halsted, LCSW Phone Number: 05/14/2021, 3:04 PM   Clinical Narrative:    CSW scheduled ride home for patient through [facility] transport.   Final next level of care: Home/Self Care Barriers to Discharge: Barriers Resolved   Patient Goals and CMS Choice        Discharge Placement                       Discharge Plan and Services   Discharge Planning Services: CM Consult                                 Social Determinants of Health (SDOH) Interventions     Readmission Risk Interventions No flowsheet data found.

## 2021-05-14 NOTE — Discharge Summary (Addendum)
[facility] Discharge Summary  Patient name: [name] [name] Medical record number: [id number] Date of birth: June 12, 1961 Age: 60 y.o. Gender: male Date of Admission: 05/09/2021  Date of Discharge: 05/14/2021 Admitting Physician: Martyn Malay, MD  Primary Care Provider: Lind Covert, MD Consultants: None  Indication for Hospitalization: community acquired pneumonia/COPD exacerbation  Discharge Diagnoses/Problem List:  COPD, HLD, HTN, CAD, depression, T2DM with insulin dependence, CAD with history of NSTEMI, history of tobacco use disorder  Disposition: home  Discharge Condition: stable, improved  Discharge Exam:  Physical Exam Vitals reviewed.  HENT:     Head: Normocephalic and atraumatic.  Cardiovascular:     Rate and Rhythm: Normal rate and regular rhythm.     Heart sounds: No murmur heard. Pulmonary:     Effort: Pulmonary effort is normal.     Breath sounds: Normal breath sounds.  Abdominal:     Palpations: Abdomen is soft.     Tenderness: There is no abdominal tenderness.  Neurological:     Mental Status: He is alert.      Brief Hospital Course:  [name] [name] is a 60 y.o. male who presented with SOB found to be in acute hypoxic respiratory failure that was likely 2/2 to COPD exacerbation.   Acute Hypoxic Respiratory Failure 2/2 COPD Exacerbation  Patient intially was hypoxic to 80s and required bipap shortly after admission. Admission labs were notable for BNP of 145 and troponin levels that trended flat (8, 12 and 12). He had a normal CXR and RVP on admission.  Patient had no signs of a pulmonary embolism with lack of tachycardia hemoptysis and a well score 0.  CHF unlikely because patient was not fluid overloaded on exam.  Patient was given IV steroids, duo nebs, was placed on azithromycin. Echocardiogram was completed and showed left ventricular ejection fraction 60 to 65% with normal left ventricular function and mild left ventricular  hypertrophy.  Presence of grade 1 diastolic dysfunction. CTA showed no evidence of pulmonary embolism but did show evidence of consolidation/collapse of lateral right middle lobe.  He was started on azithromycin and also started on ceftriaxone. He completed a five day course of prednisone 40 mg. He was eventually weaned off of BiPAP on hospital day 3 to nasal cannula. On hospital day 4, he was off oxygen for a period of several hours with sats above 92%. On hospital day 5, he was off of O2 and passed an ambulatory O2 test. He consistently required BiPAP/CPAP at night for OSA.  He was switched from ceftriaxone to Cefdinir in anticipation of D/C. He was discharged with three doses of cefdinir to be finished outpatient.   DM2 Home meds: Lantus 45 units BID, Jardiance 29m, novolog 30U BID , metformin 10032mBID, Victoza 1.54m34mnjection once weekly.  Patient has been on NovoLog 6 units 3 times a day, resistant sliding scale insulin, and Semglee 30 units 2 times daily and CBGs have been elevated. With restart of home MF and Jardiance, his CBGs decreased significantly. His Semglee was reduced from 30 units BID to 26 units BID. He was discharged on his home diabetic regimen and instructed to follow up with PCP.    HTN  Blood pressure range has been wide from 130s to 180s/80s.  He was on home medicine of lisinopril 2.5 mg twice daily.  Due to soft blood pressures, antihypertensive regimen discontinued then coreg restarted. Discharged on only home home coreg. Lisinopril was not continued, needs reevaluation at follow up with PCP.  Issues for PCP follow up  Follow-up for blood pressure, home meds include lisinopril and coreg. Discharged on home coreg. Lisinopril was discontinued prior to discharge. Please check BP and adjust as appropriate.  Repeat glucose, discharged home on home diabetic regimen. Ensure patient completes antibiotic course.  Significant Procedures:  CTA of the chest showing RML  consolidation, indicating pneumonia  Significant Labs and Imaging:  Recent Labs  Lab 05/10/21 0209 05/10/21 1532 05/11/21 0056 05/13/21 0329  WBC 13.7*  --  22.5* 15.4*  HGB 16.1 15.3 14.9 15.1  HCT 49.7 45.0 44.5 45.4  PLT 239  --  251 198   Recent Labs  Lab 05/09/21 2021 05/10/21 0209 05/10/21 1532 05/12/21 1108 05/13/21 0329 05/14/21 0145  NA 136 137 136 134* 137 135  K 4.0 4.3 4.0 4.1 3.6 3.6  CL 103 103  --  100 104 99  CO2 24 24  --  '26 25 28  ' GLUCOSE 159* 210*  --  300* 109* 148*  BUN 10 12  --  '15 15 19  ' CREATININE 0.68 0.69  --  0.79 0.61 0.78  CALCIUM 8.9 9.0  --  8.5* 8.8* 9.1     Results/Tests Pending at Time of Discharge:  None  Discharge Medications:  Allergies as of 05/14/2021       Reactions   Shellfish Allergy Nausea And Vomiting   OYSTERS        Medication List     STOP taking these medications    ciprofloxacin-dexamethasone OTIC suspension Commonly known as: CIPRODEX   ketoconazole 2 % cream Commonly known as: NIZORAL   lisinopril 2.5 MG tablet Commonly known as: ZESTRIL       TAKE these medications    Accu-Chek FastClix Lancets Misc 1 Units by Percutaneous route 4 (four) times daily.   Accu-Chek Guide test strip Generic drug: glucose blood Use as instructed   Accu-Chek Aviva Plus test strip Generic drug: glucose blood Use as instructed   Accu-Chek Guide w/Device Kit Check blood sugar once each morning   aspirin 81 MG chewable tablet Chew 1 tablet (81 mg total) by mouth daily.   atorvastatin 80 MG tablet Commonly known as: LIPITOR Take 1 tablet (80 mg total) by mouth daily. What changed: when to take this   BD Insulin Syringe U/F 31G X 5/16" 1 ML Misc Generic drug: Insulin Syringe-Needle U-100 USE UP TO FIVE TIMES A DAY WITH NOVOLOG AND LANTUS DOSING   BD ULTRA-FINE PEN NEEDLES 29G X 12.7MM Misc Generic drug: Insulin Pen Needle USE TO WITH VICTOZA PEN, INJECTION ONCE DAILY.   buPROPion 150 MG 24 hr  tablet Commonly known as: WELLBUTRIN XL TAKE ONE TABLET BY MOUTH DAILY   carvedilol 3.125 MG tablet Commonly known as: COREG Take 1 tablet (3.125 mg total) by mouth 2 (two) times daily with a meal.   cefdinir 300 MG capsule Commonly known as: OMNICEF Take 1 capsule (300 mg total) by mouth every 12 (twelve) hours.   citalopram 20 MG tablet Commonly known as: CELEXA Take 1 tablet (20 mg total) by mouth daily.   Dulera 200-5 MCG/ACT Aero Generic drug: mometasone-formoterol INHALE TWO PUFFS BY MOUTH TWICE A DAY   Fish Oil 1000 MG Caps Take 1 capsule (1,000 mg total) by mouth daily.   Jardiance 25 MG Tabs tablet Generic drug: empagliflozin TAKE ONE TABLET BY MOUTH DAILY What changed: how much to take   Lantus 100 UNIT/ML injection Generic drug: insulin glargine INJECT 45 UNITS UNDER THE SKIN TWICE  A DAY   Melatonin 2.5 MG Caps Take 5 mg by mouth at bedtime.   metFORMIN 1000 MG tablet Commonly known as: GLUCOPHAGE Take 1 tablet (1,000 mg total) by mouth 2 (two) times daily with a meal.   Miconazole Nitrate 2 % Aero Spray between toes every morning. What changed:  how much to take how to take this when to take this   Mucus Relief DM 30-600 MG Tb12 Take 1 tablet by mouth 2 (two) times daily as needed for cough.   multivitamin tablet Take 1 tablet by mouth daily.   mupirocin ointment 2 % Commonly known as: BACTROBAN Apply to eft great toe and 2nd toe every evening.   nitroGLYCERIN 0.4 MG SL tablet Commonly known as: NITROSTAT DISSOLVE 1 TAB UNDER TONGUE FOR CHEST PAIN - IF PAIN REMAINS AFTER 5 MIN, CALL 911 AND REPEAT DOSE. MAX 3 TABS IN 15 MINUTES   NovoLOG 100 UNIT/ML injection Generic drug: insulin aspart INJECT 30 UNITS INTO THE SKIN 2 (TWO) TIMES DAILY WITH A MEAL What changed: See the new instructions.   prasugrel 10 MG Tabs tablet Commonly known as: EFFIENT Take 1 tablet (10 mg total) by mouth daily.   ProAir HFA 108 (90 Base) MCG/ACT  inhaler Generic drug: albuterol INHALE TWO PUFFS BY MOUTH EVERY 6 HOURS AS NEEDED FOR WHEEZING OR FOR SHORTNESS OF BREATH   traMADol 50 MG tablet Commonly known as: ULTRAM TAKE ONE TABLET BY MOUTH EVERY 6 HOURS AS NEEDED What changed: reasons to take this   Victoza 18 MG/3ML Sopn Generic drug: liraglutide Inject 1.8 mg into the skin daily.        Discharge Instructions: Please refer to Patient Instructions section of EMR for full details.  Patient was counseled important signs and symptoms that should prompt return to medical care, changes in medications, dietary instructions, activity restrictions, and follow up appointments.   Follow-Up Appointments:  Follow-up Information     Lind Covert, MD. Go on 05/18/2021.   Specialty: Family Medicine Why: Please go to your appointment at 11:10 am, please arrive 15 minutes earlier than your scheduled appointment time. Contact information: [place] [address] [phone]         [place], Peter M, MD Follow up.   Specialty: Cardiology Contact information: [address] [place] [address] [place] PGY-1, Psychiatry

## 2021-05-14 NOTE — Discharge Instructions (Addendum)
You were hospitalized at [facility] due to difficulty breathing.  We expect this is from COPD along with pneumonia which improved after steroid treatment and antibiotics. We are so glad you are feeling better.  Be sure to follow-up with your regularly scheduled appointments.  Please also be sure to follow-up with our clinic/PCP on 9/6 with Dr. Erin Hearing at 11:10 am, please arrive 15 minutes early.  Please complete the course of your cefdinir. Thank you for allowing [place] to be a part of your medical care.  Take care, [facility] family medicine team

## 2021-05-18 ENCOUNTER — Encounter: Payer: Self-pay | Admitting: Family Medicine

## 2021-05-18 ENCOUNTER — Other Ambulatory Visit: Payer: Self-pay

## 2021-05-18 ENCOUNTER — Ambulatory Visit (INDEPENDENT_AMBULATORY_CARE_PROVIDER_SITE_OTHER): Payer: Medicaid Other | Admitting: Family Medicine

## 2021-05-18 VITALS — BP 122/60 | HR 74 | Wt 286.0 lb

## 2021-05-18 DIAGNOSIS — E1165 Type 2 diabetes mellitus with hyperglycemia: Secondary | ICD-10-CM

## 2021-05-18 DIAGNOSIS — Z23 Encounter for immunization: Secondary | ICD-10-CM

## 2021-05-18 DIAGNOSIS — E1159 Type 2 diabetes mellitus with other circulatory complications: Secondary | ICD-10-CM | POA: Diagnosis not present

## 2021-05-18 DIAGNOSIS — Z794 Long term (current) use of insulin: Secondary | ICD-10-CM

## 2021-05-18 DIAGNOSIS — J9601 Acute respiratory failure with hypoxia: Secondary | ICD-10-CM

## 2021-05-18 LAB — POCT GLYCOSYLATED HEMOGLOBIN (HGB A1C): HbA1c, POC (controlled diabetic range): 7.8 % — AB (ref 0.0–7.0)

## 2021-05-18 MED ORDER — ZOSTER VAC RECOMB ADJUVANTED 50 MCG/0.5ML IM SUSR: INTRAMUSCULAR | 1 refills | Status: DC

## 2021-05-18 MED ORDER — EMPAGLIFLOZIN 25 MG PO TABS: 25.0000 mg | ORAL_TABLET | Freq: Every day | ORAL | 3 refills | Status: DC

## 2021-05-18 NOTE — Assessment & Plan Note (Signed)
Reasonable control.  Continue low dose lisinopril for renal protection as is tolerating well

## 2021-05-18 NOTE — Assessment & Plan Note (Signed)
Much improved.  Back on maintenance therapy.  Need to continue smoking discussion next visit

## 2021-05-18 NOTE — Progress Notes (Signed)
    SUBJECTIVE:   CHIEF COMPLAINT / HPI:   COPD EXACERBATION He feels like he is back to normal breathing.  No fever or sputum  Cough mostly gone.  Brings in all his medications and knows them  DIABETES No low blood sugars.  Has all his medications.  Has been taking his lisinopril 2.5 mg twice a day since discharge.  No lightheadness or swelling  PERTINENT  PMH / PSH: has a therapy dog  OBJECTIVE:   BP 122/60   Pulse 74   Wt 286 lb (129.7 kg)   SpO2 95%   BMI 44.79 kg/m   Heart - Regular rate and rhythm.  No murmurs, gallops or rubs.    Lungs:  Normal respiratory effort, chest expands symmetrically. Lungs are clear to auscultation, no crackles or wheezes. No edema   ASSESSMENT/PLAN:   Acute hypoxemic respiratory failure (HCC) Much improved.  Back on maintenance therapy.  Need to continue smoking discussion next visit   Type 2 diabetes mellitus with hyperglycemia, with long-term current use of insulin (HCC) Reasonable control.  Continue low dose lisinopril for renal protection as is tolerating well      Lind Covert, MD [place]

## 2021-05-18 NOTE — Patient Instructions (Signed)
Good to see you today - Thank you for coming in  Things we discussed today:  I sent a prescription to your pharmacy for your Zoster vaccines to help prevent Shingles.  The shot may cause a sore arm and mild flu like symptoms for a few days.  You will need a second shot 2 months after your first.    Please always bring your medication bottles  Come back to see me in 3 months

## 2021-05-19 ENCOUNTER — Inpatient Hospital Stay: Payer: Medicaid Other | Admitting: Family Medicine

## 2021-05-31 ENCOUNTER — Telehealth: Payer: Self-pay | Admitting: Family Medicine

## 2021-05-31 NOTE — Telephone Encounter (Signed)
Patient is leaving letter from [employer]. They state that the application originally filled out was incomplete. Please call patient with any questions.   I have placed letter in Dr. Margaretha Sheffield box. Please advise.

## 2021-06-02 ENCOUNTER — Other Ambulatory Visit: Payer: Self-pay | Admitting: Family Medicine

## 2021-06-02 DIAGNOSIS — I10 Essential (primary) hypertension: Secondary | ICD-10-CM

## 2021-06-02 DIAGNOSIS — I214 Non-ST elevation (NSTEMI) myocardial infarction: Secondary | ICD-10-CM

## 2021-06-03 ENCOUNTER — Encounter: Payer: Self-pay | Admitting: Family Medicine

## 2021-06-03 NOTE — Telephone Encounter (Signed)
Printed letter to be faxed to them

## 2021-06-16 ENCOUNTER — Other Ambulatory Visit: Payer: Self-pay | Admitting: Family Medicine

## 2021-06-16 DIAGNOSIS — F32A Depression, unspecified: Secondary | ICD-10-CM

## 2021-07-07 ENCOUNTER — Other Ambulatory Visit: Payer: Self-pay | Admitting: Family Medicine

## 2021-08-13 ENCOUNTER — Other Ambulatory Visit: Payer: Self-pay

## 2021-08-13 ENCOUNTER — Ambulatory Visit: Payer: Medicaid Other | Admitting: Podiatry

## 2021-08-13 ENCOUNTER — Encounter: Payer: Self-pay | Admitting: Podiatry

## 2021-08-13 DIAGNOSIS — M79675 Pain in left toe(s): Secondary | ICD-10-CM

## 2021-08-13 DIAGNOSIS — J449 Chronic obstructive pulmonary disease, unspecified: Secondary | ICD-10-CM

## 2021-08-13 DIAGNOSIS — M79674 Pain in right toe(s): Secondary | ICD-10-CM | POA: Diagnosis not present

## 2021-08-13 DIAGNOSIS — E119 Type 2 diabetes mellitus without complications: Secondary | ICD-10-CM

## 2021-08-13 DIAGNOSIS — B351 Tinea unguium: Secondary | ICD-10-CM

## 2021-08-13 DIAGNOSIS — Z794 Long term (current) use of insulin: Secondary | ICD-10-CM

## 2021-08-13 MED ORDER — DULERA 200-5 MCG/ACT IN AERO: 2.0000 | INHALATION_SPRAY | Freq: Two times a day (BID) | RESPIRATORY_TRACT | 6 refills | Status: DC

## 2021-08-18 ENCOUNTER — Encounter: Payer: Self-pay | Admitting: Family Medicine

## 2021-08-18 ENCOUNTER — Ambulatory Visit (INDEPENDENT_AMBULATORY_CARE_PROVIDER_SITE_OTHER): Payer: Medicaid Other | Admitting: Family Medicine

## 2021-08-18 ENCOUNTER — Other Ambulatory Visit: Payer: Self-pay

## 2021-08-18 VITALS — BP 133/64 | HR 70 | Ht 67.0 in | Wt 301.6 lb

## 2021-08-18 DIAGNOSIS — E1165 Type 2 diabetes mellitus with hyperglycemia: Secondary | ICD-10-CM

## 2021-08-18 DIAGNOSIS — I10 Essential (primary) hypertension: Secondary | ICD-10-CM

## 2021-08-18 DIAGNOSIS — J449 Chronic obstructive pulmonary disease, unspecified: Secondary | ICD-10-CM | POA: Diagnosis not present

## 2021-08-18 DIAGNOSIS — Z794 Long term (current) use of insulin: Secondary | ICD-10-CM | POA: Diagnosis not present

## 2021-08-18 DIAGNOSIS — Z720 Tobacco use: Secondary | ICD-10-CM | POA: Diagnosis not present

## 2021-08-18 LAB — POCT GLYCOSYLATED HEMOGLOBIN (HGB A1C): HbA1c, POC (controlled diabetic range): 8.2 % — AB (ref 0.0–7.0)

## 2021-08-18 MED ORDER — ZOSTER VAC RECOMB ADJUVANTED 50 MCG/0.5ML IM SUSR: INTRAMUSCULAR | 1 refills | Status: DC

## 2021-08-18 MED ORDER — DULERA 200-5 MCG/ACT IN AERO: 2.0000 | INHALATION_SPRAY | Freq: Two times a day (BID) | RESPIRATORY_TRACT | 6 refills | Status: DC

## 2021-08-18 NOTE — Progress Notes (Signed)
ANNUAL DIABETIC FOOT EXAM  Subjective: [name] [name] presents today for for annual diabetic foot examination and painful elongated mycotic toenails 1-5 bilaterally which are tender when wearing enclosed shoe gear. Pain is relieved with periodic professional debridement..  Patient relates 9 year h/o diabetes.  Patient denies any h/o foot wounds.  Patient denies any numbness, tingling, burning, or pins/needle sensation in feet.  Patient's blood sugar was 138 mg/dl today.  Lind Covert, MD is patient's PCP. Last visit was 05/18/2021.  Past Medical History:  Diagnosis Date   Alcoholism [facility])    Anxiety    Arthritis    "qwhere"   Asthma    CAD S/P percutaneous coronary angioplasty 01/18/12; 10/2013   a. pRCA 3.5 x 18 vision BMS - 4.2 mm; b. 2/'15: mCx 3.5 x 12  Rebel BMS (3.6-3.7 mm)   Chronic back pain    "mid/lower" (08/01/2014)   COPD (chronic obstructive pulmonary disease) ([place])    "I'm seeing COPD dr now; don't know if I've got it" (08/01/2014)   Depression    GERD (gastroesophageal reflux disease)    Hyperlipidemia    Hypertension    Migraines    "once in awhile" (08/01/2014)   Non-ST elevation myocardial infarction (NSTEMI) ([place]) 10/2013   OSA on CPAP 01/19/2012   Sleep apnea    Type II diabetes mellitus ([place])    Patient Active Problem List   Diagnosis Date Noted   Colon polyps 02/18/2020   Antiplatelet or antithrombotic long-term use 12/23/2019   Mild nonproliferative diabetic retinopathy of both eyes without macular edema associated with type 2 diabetes mellitus ([place]) 04/30/2019   Depression, acute 02/17/2016   Hearing difficulty 12/10/2014   COPD, moderate ([place]) 11/22/2014   Acute hypoxemic respiratory failure ([place]) 11/22/2014   Angina, class II ([place]) 08/01/2014   H/O NSTEMI (non-ST elevated myocardial infarction) 11/07/2013   Tobacco abuse 08/24/2012   Obesity 07/31/2012   Type 2 diabetes mellitus with hyperglycemia, with long-term current use of insulin  ([place]) 06/05/2012   CAD S/P percutaneous coronary angioplasty 01/21/2012   OSA on CPAP 01/19/2012   Essential hypertension 01/18/2012   Hyperlipidemia with target LDL less than 70 01/18/2012   Past Surgical History:  Procedure Laterality Date   CARDIAC CATHETERIZATION  07/2014   Left Main: Short, large-caliber vessel. Widely patent. Bifurcates into the LAD and Circumflex. Angiographically normal.   CORONARY ANGIOPLASTY WITH STENT PLACEMENT  01/2012; 11/07/2013; 08/01/2014   "1 + 2 + 1"    CORONARY ANGIOPLASTY WITH STENT PLACEMENT  07/2014   Severe single-vessel disease involving the bifurcation of OM1 and OM 2 with 99% in-stent restenosis in the bare-metal stent placed in the OM 2.   LEFT HEART CATHETERIZATION WITH CORONARY ANGIOGRAM N/A 01/20/2012   Procedure: LEFT HEART CATHETERIZATION WITH CORONARY ANGIOGRAM;  Surgeon: Jettie Booze, MD;  Location: [facility] CATH LAB;  Service: Cardiovascular;  Laterality: N/A;   LEFT HEART CATHETERIZATION WITH CORONARY ANGIOGRAM N/A 11/07/2013   Procedure: LEFT HEART CATHETERIZATION WITH CORONARY ANGIOGRAM;  Surgeon: Jettie Booze, MD;  Location: [facility] CATH LAB;  Service: Cardiovascular;  Laterality: N/A;   LEFT HEART CATHETERIZATION WITH CORONARY ANGIOGRAM N/A 08/01/2014   Procedure: LEFT HEART CATHETERIZATION WITH CORONARY ANGIOGRAM;  Surgeon: Leonie Man, MD;  Location: [facility] CATH LAB;  Service: Cardiovascular;  Laterality: N/A;   MULTIPLE TOOTH EXTRACTIONS     PERCUTANEOUS CORONARY STENT INTERVENTION (PCI-S)  01/20/2012   Procedure: PERCUTANEOUS CORONARY STENT INTERVENTION (PCI-S);  Surgeon: Jettie Booze, MD;  Location: [facility] CATH  LAB;  Service: Cardiovascular;;   PERCUTANEOUS CORONARY STENT INTERVENTION (PCI-S)  11/07/2013   Procedure: PERCUTANEOUS CORONARY STENT INTERVENTION (PCI-S);  Surgeon: Jettie Booze, MD;  Location: [facility] CATH LAB;  Service: Cardiovascular;;   Current Outpatient Medications on File Prior to Visit  Medication Sig  Dispense Refill   ACCU-CHEK FASTCLIX LANCETS MISC 1 Units by Percutaneous route 4 (four) times daily. 100 each 12   albuterol (PROAIR HFA) 108 (90 Base) MCG/ACT inhaler INHALE TWO PUFFS BY MOUTH EVERY 6 HOURS AS NEEDED FOR WHEEZING OR FOR SHORTNESS OF BREATH 8.5 g 6   aspirin 81 MG chewable tablet Chew 1 tablet (81 mg total) by mouth daily. 180 tablet 2   atorvastatin (LIPITOR) 80 MG tablet Take 1 tablet (80 mg total) by mouth daily. (Patient taking differently: Take 80 mg by mouth daily with supper.) 90 tablet 1   BD INSULIN SYRINGE U/F 31G X 5/16" 1 ML MISC USE UP TO FIVE TIMES A DAY WITH NOVOLOG AND LANTUS DOSING 100 each 1   Blood Glucose Monitoring Suppl (ACCU-CHEK GUIDE) w/Device KIT Check blood sugar once each morning 1 kit 0   buPROPion (WELLBUTRIN XL) 150 MG 24 hr tablet TAKE ONE TABLET BY MOUTH DAILY 90 tablet 2   carvedilol (COREG) 3.125 MG tablet TAKE ONE TABLET BY MOUTH TWICE A DAY WITH MEALS 180 tablet 3   citalopram (CELEXA) 20 MG tablet TAKE ONE TABLET BY MOUTH DAILY 90 tablet 1   empagliflozin (JARDIANCE) 25 MG TABS tablet Take 1 tablet (25 mg total) by mouth daily. 90 tablet 3   glucose blood (ACCU-CHEK AVIVA PLUS) test strip Use as instructed 100 strip 3   glucose blood (ACCU-CHEK GUIDE) test strip Use as instructed 100 each 4   Insulin Pen Needle (BD ULTRA-FINE PEN NEEDLES) 29G X 12.7MM MISC USE TO WITH VICTOZA PEN, INJECTION ONCE DAILY. 100 each 6   LANTUS 100 UNIT/ML injection INJECT 45 UNITS UNDER THE SKIN TWICE A DAY 30 mL 6   liraglutide (VICTOZA) 18 MG/3ML SOPN Inject 1.8 mg into the skin daily. 27 mL 11   lisinopril (ZESTRIL) 2.5 MG tablet Take 1 tablet (2.5 mg total) by mouth in the morning and at bedtime. 90 tablet 3   Melatonin 2.5 MG CAPS Take 5 mg by mouth at bedtime.     metFORMIN (GLUCOPHAGE) 1000 MG tablet Take 1 tablet (1,000 mg total) by mouth 2 (two) times daily with a meal. 180 tablet 3   Miconazole Nitrate 2 % AERO Spray between toes every morning. (Patient  taking differently: Apply 1 spray topically daily. Spray between toes every morning.) 133 g 2   Multiple Vitamin (MULTIVITAMIN) tablet Take 1 tablet by mouth daily.     nitroGLYCERIN (NITROSTAT) 0.4 MG SL tablet DISSOLVE 1 TAB UNDER TONGUE FOR CHEST PAIN - IF PAIN REMAINS AFTER 5 MIN, CALL 911 AND REPEAT DOSE. MAX 3 TABS IN 15 MINUTES 100 tablet 0   NOVOLOG 100 UNIT/ML injection INJECT 30 UNITS INTO THE SKIN 2 (TWO) TIMES DAILY WITH A MEAL (Patient taking differently: Inject 30 Units into the skin 2 (two) times daily with a meal.) 30 mL 11   Omega-3 Fatty Acids (FISH OIL) 1000 MG CAPS Take 1 capsule (1,000 mg total) by mouth daily.     prasugrel (EFFIENT) 10 MG TABS tablet Take 1 tablet (10 mg total) by mouth daily. 90 tablet 3   traMADol (ULTRAM) 50 MG tablet TAKE ONE TABLET BY MOUTH EVERY 6 HOURS AS NEEDED (Patient not taking:  Reported on 08/18/2021) 30 tablet 0   No current facility-administered medications on file prior to visit.    Allergies  Allergen Reactions   Shellfish Allergy Nausea And Vomiting    OYSTERS   Social History   Occupational History   Occupation: Unemployed    Comment: Used to work for [employer]   Occupation: Ship broker, Human resources officer  Tobacco Use   Smoking status: Former    Packs/day: 1.00    Years: 42.00    Pack years: 42.00    Types: Cigarettes    Start date: 09/13/1975    Quit date: 01/25/2018    Years since quitting: 3.5   Smokeless tobacco: Former    Types: Chew   Tobacco comments:    Bupropion helping  Vaping Use   Vaping Use: Never used  Substance and Sexual Activity   Alcohol use: Not Currently    Alcohol/week: 6.0 standard drinks    Types: 6 Cans of beer per week    Comment: pt reports "I quit 66 days ago" (05/09/16)   Drug use: No    Comment: Crack Cocaine (denies 05/09/16)   Sexual activity: Not Currently   Family History  Problem Relation Age of Onset   Breast cancer Mother    Heart attack Father 80   Hypertension Sister     Hyperlipidemia Sister    Hyperlipidemia Sister    Hypertension Sister    Diabetes Sister    Heart attack Brother    Stroke Brother    Diabetes Brother    Colon cancer Neg Hx    Esophageal cancer Neg Hx    Rectal cancer Neg Hx    Stomach cancer Neg Hx    Immunization History  Administered Date(s) Administered   Influenza Split 08/24/2012   Influenza,inj,Quad PF,6+ Mos 07/31/2013, 07/08/2014, 05/27/2015, 05/23/2016, 06/07/2017, 05/24/2018, 05/24/2019, 05/19/2020, 05/18/2021   PFIZER(Purple Top)SARS-COV-2 Vaccination 12/21/2019, 01/15/2020, 09/19/2020   Pneumococcal Polysaccharide-23 01/19/2012, 03/01/2017   Tdap 08/24/2012, 10/21/2019     Review of Systems: Negative except as noted in the HPI.   Objective: There were no vitals filed for this visit.  Renly Roots is a pleasant 60 y.o. male in NAD. AAO X 3.  Vascular Examination: CFT immediate b/l LE. Palpable DP/PT pulses b/l LE. Digital hair sparse b/l. Skin temperature gradient WNL b/l. No pain with calf compression b/l. No edema noted b/l. No cyanosis or clubbing noted b/l LE.  Dermatological Examination: Pedal skin warm and supple b/l.  No open wounds b/l. No interdigital macerations. Toenails 1-5 b/l elongated, thickened, discolored with subungual debris. +Tenderness with dorsal palpation of nailplates.No hyperkeratotic nor porokeratotic lesions noted b/l.  Musculoskeletal Examination: Normal muscle strength 5/5 to all lower extremity muscle groups bilaterally. No pain, crepitus or joint limitation noted with ROM b/l LE. No gross bony pedal deformities b/l. Patient ambulates independently without assistive aids.  Footwear Assessment: Does the patient wear appropriate shoes? Yes. Does the patient need inserts/orthotics? No.  Neurological Examination: Protective sensation diminished with 10g monofilament b/l.  Hemoglobin A1C Latest Ref Rng & Units 08/18/2021 05/18/2021 02/17/2021 11/24/2020 09/02/2020  HGBA1C 0.0 - 7.0 % 8.2(A)  7.8(A) 7.7(A) 7.4(A) 8.0(A)  Some recent data might be hidden   Assessment: 1. Pain due to onychomycosis of toenails of both feet   2. Type 2 diabetes mellitus without complication, with long-term current use of insulin ([place])   3. Encounter for diabetic foot exam ([place])     ADA Risk Categorization: Low Risk :  Patient has all  of the following: Intact protective sensation No prior foot ulcer  No severe deformity Pedal pulses present  Plan: -Diabetic foot examination performed today. -Continue foot and shoe inspections daily. Monitor blood glucose per PCP/Endocrinologist's recommendations. -Patient to continue soft, supportive shoe gear daily. -Mycotic toenails 1-5 bilaterally were debrided in length and girth with sterile nail nippers and dremel without incident. -Patient/POA to call should there be question/concern in the interim.  Return in about 3 months (around 11/11/2021).  Marzetta Board, DPM

## 2021-08-18 NOTE — Assessment & Plan Note (Signed)
A1c is not bad but he is on a large amount of insulin and is gaining weight.  Will refer to pharmacy for substituting another GLP1 that may have more weight loss and hopefully reduction of his insulin and education around a more appropriate regimen.  He is in agreement.

## 2021-08-18 NOTE — Progress Notes (Signed)
    SUBJECTIVE:   CHIEF COMPLAINT / HPI:   Diabetes He brings in all his medications.  Takes his Novolog 30 units twice a day and his Lantus 45 units sometimes once sometimes twice a day. Takes jardiance, metformin and victoza regularly.  Has had blood sugar in the 70s at lowest.  Is willing to try a different regimen.  Tobacco Still tobacco free.  Using a variety of mints when has a craving  Hypertension Brings in his medications.  No lightheadness or chest pain or edema   PERTINENT  PMH / PSH: is concerned that he is gaining weight   OBJECTIVE:   BP 133/64   Pulse 70   Ht 5\' 7"  (1.702 m)   Wt (!) 301 lb 9.6 oz (136.8 kg)   SpO2 94%   BMI 47.24 kg/m   Good spirits Heart - Regular rate and rhythm.  No murmurs, gallops or rubs.    Lungs:  Normal respiratory effort, chest expands symmetrically. Lungs are clear to auscultation, no crackles or wheezes.  Has gained 14 lbs since last visit  ASSESSMENT/PLAN:   Essential hypertension BP Readings from Last 3 Encounters:  08/18/21 133/64  05/18/21 122/60  05/14/21 140/72   At goal.  Continue current medications   Type 2 diabetes mellitus with hyperglycemia, with long-term current use of insulin (H[facility]) A1c is not bad but he is on a large amount of insulin and is gaining weight.  Will refer to pharmacy for substituting another GLP1 that may have more weight loss and hopefully reduction of his insulin and education around a more appropriate regimen.  He is in agreement.    Tobacco abuse Remains tobacco free!     Lind Covert, MD [place]

## 2021-08-18 NOTE — Patient Instructions (Addendum)
Good to see you today - Thank you for coming in  Things we discussed today:   I sent a prescription to your pharmacy for your Zoster vaccines to help prevent Shingles.  The shot may cause a sore arm and mild flu like symptoms for a few days.  You will need a second shot 2 months after your first.    Cut back on your food over the holidays   Please always bring your medication bottles  Come back to see me in 3 months  Make an appointment with Jacqlyn Krauss to see if she can have you take less insulin and a better medication to lose weight

## 2021-08-18 NOTE — Assessment & Plan Note (Signed)
BP Readings from Last 3 Encounters:  08/18/21 133/64  05/18/21 122/60  05/14/21 140/72   At goal.  Continue current medications

## 2021-08-18 NOTE — Assessment & Plan Note (Signed)
Remains tobacco free.   

## 2021-09-07 ENCOUNTER — Other Ambulatory Visit: Payer: Self-pay | Admitting: Family Medicine

## 2021-09-11 ENCOUNTER — Other Ambulatory Visit: Payer: Self-pay | Admitting: Family Medicine

## 2021-09-11 DIAGNOSIS — I10 Essential (primary) hypertension: Secondary | ICD-10-CM

## 2021-09-11 DIAGNOSIS — I214 Non-ST elevation (NSTEMI) myocardial infarction: Secondary | ICD-10-CM

## 2021-09-15 ENCOUNTER — Ambulatory Visit (INDEPENDENT_AMBULATORY_CARE_PROVIDER_SITE_OTHER): Payer: Medicaid Other | Admitting: Pharmacist

## 2021-09-15 ENCOUNTER — Other Ambulatory Visit: Payer: Self-pay

## 2021-09-15 DIAGNOSIS — E1165 Type 2 diabetes mellitus with hyperglycemia: Secondary | ICD-10-CM | POA: Diagnosis not present

## 2021-09-15 DIAGNOSIS — Z794 Long term (current) use of insulin: Secondary | ICD-10-CM | POA: Diagnosis not present

## 2021-09-15 MED ORDER — LANTUS SOLOSTAR 100 UNIT/ML ~~LOC~~ SOPN: 50.0000 [IU] | PEN_INJECTOR | Freq: Every day | SUBCUTANEOUS | 2 refills | Status: DC

## 2021-09-15 MED ORDER — FREESTYLE LIBRE 2 READER DEVI: 0 refills | Status: DC

## 2021-09-15 MED ORDER — FREESTYLE LIBRE 2 SENSOR MISC: 2 refills | Status: DC

## 2021-09-15 MED ORDER — NOVOLOG FLEXPEN 100 UNIT/ML ~~LOC~~ SOPN: 20.0000 [IU] | PEN_INJECTOR | Freq: Three times a day (TID) | SUBCUTANEOUS | 4 refills | Status: DC

## 2021-09-15 MED ORDER — OZEMPIC (1 MG/DOSE) 4 MG/3ML ~~LOC~~ SOPN: 1.0000 mg | PEN_INJECTOR | SUBCUTANEOUS | 0 refills | Status: DC

## 2021-09-15 MED ORDER — PEN NEEDLES 32G X 4 MM MISC: 12 refills | Status: DC

## 2021-09-15 NOTE — Progress Notes (Signed)
Subjective:    Patient ID: [name] [name], male    DOB: Jan 02, 1961, 61 y.o.   MRN: [id number]  HPI Patient is a 61 y.o. male who presents for diabetes management. He is in good spirits and presents without assistance. Patient was referred and last seen by Primary Care Provider on 08/18/21.  Patient reports diabetes was diagnosed around 10 years ago.   Insurance coverage/medication affordability: U[employer] Medicaid  Family/Social history: lives with sister  Current diabetes medications include: Novolog 30 units twice a day, Lantus 45 units sometimes once sometimes twice a day, jardiance 25mg , metformin 1000mg  twice daily, and victoza 1.8mg  regularly Current hypertension medications include: lisinopril 2.5mg , carvedilol 3.125mg  twice daily Current hyperlipidemia medications include: atorvastatin 80mg  Patient states that He is taking his medications as prescribed. Patient reports adherence with medications.   Do you feel that your medications are working for you?  yes  Have you been experiencing any side effects to the medications prescribed? no  Do you have any problems obtaining medications due to transportation or finances?  no    Patient reported dietary habits:  Eats 3 meals/day and 2 snacks/day; [place] with 2 meals/day and 0 snacks/day Breakfast: 2 PB&J sandwich + 2% milk Lunch: ham or [place] and cheese sandwich Dinner: st[employer] TV dinner Snacks: sandwiches, swiss roll, ice cream Drinks:diet gingerale, water, on occasion will have coffee or hot tea  Patient-reported exercise habits: denies since moving   Patient reports hypoglycemic events. Patient reports polyuria (increased urination).  Patient reports polyphagia (increased appetite).  Patient reports polydipsia (increased thirst).  Patient denies neuropathy (nerve pain). Patient denies visual changes. Patient reports self foot exams.   Home fasting blood sugars: 132, 86  2 hour post-meal/random blood sugars:  150-200's  Objective:   Labs:   Physical Exam Neurological:     Mental Status: He is alert and oriented to person, place, and time.    Review of Systems  Gastrointestinal:  Negative for nausea and vomiting.   Lab Results  Component Value Date   HGBA1C 8.2 (A) 08/18/2021   HGBA1C 7.8 (A) 05/18/2021   HGBA1C 7.7 (A) 02/17/2021   Lab Results  Component Value Date   MICRALBCREAT 25 05/23/2016    Lipid Panel     Component Value Date/Time   CHOL 94 (L) 05/09/2019 1537   TRIG 143 05/09/2019 1537   HDL 33 (L) 05/09/2019 1537   CHOLHDL 2.8 05/09/2019 1537   CHOLHDL 3.7 03/29/2016 1227   VLDL 53 (H) 03/29/2016 1227   LDLCALC 32 05/09/2019 1537   LDLDIRECT 70 09/02/2020 1057    Clinical Atherosclerotic Cardiovascular Disease (ASCVD): Yes  The ASCVD Risk score (Arnett DK, et al., 2019) failed to calculate for the following reasons:   The patient has a prior MI or stroke diagnosis   Assessment/Plan:   T2DM is not controlled likely due to sub-optimal lifestyle and patient needing adjustments to regimen. Medication adherence appears optimal. Will switch patient from max dose Victoza to Ozempic 1mg  and titrate up towards max dose of 2mg . Will also have patient stop taking Lantus 45 units one to two times a day and consistently take 50 units of Lantus once daily in the morning. As patient appears to regularly eat 3 meals a day will split up Novolog dose over 3 meals rather than two and patient will now take 20 units three times daily as opposed to 30 units twice daily. After discussion patient interested in CGM. Following instruction patient verbalized understanding of treatment plan.  Decreased dose of basal insulin to Lantus 50 units once daily in the morning Continued  rapid insulin Novolog but split dose to 20 units three times daily Discontinued GLP-1 Victoza 1.8mg  once daily and started Ozempic 1mg  once weekly Continued SGLT2-I Jardiance 25mg  once daily Continued metformin  1000mg  twice daily Sent in prescription for Freestyle Libre 2.0 Extensively discussed pathophysiology of diabetes, dietary effects on blood sugar control, and recommended lifestyle interventions. Counseled on s/sx of and management of hypoglycemia Next A1C anticipated March 2023.   Follow-up appointment 3 weeks to review sugar readings. Written patient instructions provided.  This appointment required 35 minutes of patient care (this includes precharting, chart review, review of results, and face-to-face care).  Thank you for involving pharmacy to assist in providing this patient's care.

## 2021-09-15 NOTE — Patient Instructions (Signed)
[name] [name] it was a pleasure seeing you today.   Please do the following:  Stop Victoza once daily when you receive Ozempic and when you start Ozempic this will only be ONCE A WEEK on the same day each week Stop taking Lantus at night and only take 50 units in the morning Stop taking Novolog 30 units twice daily and take Novolog 20 units before each meal up to three times a day Continue checking blood sugars at home. It's really important that you record these and bring these in to your next doctor's appointment.  Continue making the lifestyle changes we've discussed together during our visit. Diet and exercise play a significant role in improving your blood sugars.  Follow-up with me in three weeks.    Hypoglycemia or low blood sugar:   Low blood sugar can happen quickly and may become an emergency if not treated right away.   While this shouldn't happen often, it can be brought upon if you skip a meal or do not eat enough. Also, if your insulin or other diabetes medications are dosed too high, this can cause your blood sugar to go to low.   Warning signs of low blood sugar include: Feeling shaky or dizzy Feeling weak or tired  Excessive hunger Feeling anxious or upset  Sweating even when you aren't exercising  What to do if I experience low blood sugar? Follow the Rule of 15 Check your blood sugar with your meter. If lower than 70, proceed to step 2.  Treat with 15 grams of fast acting carbs which is found in 3-4 glucose tablets. If none are available you can try hard candy, 1 tablespoon of sugar or honey,4 ounces of fruit juice, or 6 ounces of REGULAR soda.  Re-check your sugar in 15 minutes. If it is still below 70, do what you did in step 2 again. If your blood sugar has come back up, go ahead and eat a snack or small meal made up of complex carbs (ex. Whole grains) and protein at this time to avoid recurrence of low blood sugar.

## 2021-09-15 NOTE — Assessment & Plan Note (Signed)
T2DM is not controlled likely due to sub-optimal lifestyle and patient needing adjustments to regimen. Medication adherence appears optimal. Will switch patient from max dose Victoza to Ozempic 1mg  and titrate up towards max dose of 2mg . Will also have patient sto taking Lantus 45 units one to two times a day and consistently take 50 units of Lantus once daily in the morning. As patient appears to regularly eat 3 meals a day will split up Novolog dose over 3 meals rather than two and patient will now take 20 units three times daily as opposed to 30 units twice daily. Following instruction patient verbalized understanding of treatment plan.    1. Decreased dose of basal insulin to Lantus 50 units once daily in the morning 2. Continued  rapid insulin Novolog but split dose to 20 units three times daily 3. Discontinued GLP-1 Victoza 1.8mg  once daily and started Ozempic 1mg  once weekly 4. Continued SGLT2-I Jardiance 25mg  once daily 5. Continued metformin 1000mg  twice daily 6. Extensively discussed pathophysiology of diabetes, dietary effects on blood sugar control, and recommended lifestyle interventions. 7. Counseled on s/sx of and management of hypoglycemia 8. Next A1C anticipated March 2023.   Follow-up appointment 3 weeks to review sugar readings.

## 2021-09-20 ENCOUNTER — Ambulatory Visit: Payer: Medicaid Other | Admitting: Pharmacist

## 2021-09-21 NOTE — Progress Notes (Signed)
10/05/2021 [name] [name]   1961-02-19  [id number]  Primary Physician Carney Living, MD Primary Cardiologist: [name]  HPI:  [name] [name] is seen today for follow up CAD. His past medical history is significant for CAD, DM, obesity, tobacco use , hyperlipidemia and hypertension. He  is status post stenting of the right coronary artery in May of 2013. He was admitted with a NSTEMI in February of 2015. He was found to have severe disease in LCx and OM2 treated with BMS.  He was placed on dual antiplatelet therapy with aspirin plus Effient.   He presented with increased angina in October 2015. A Myoview study was intermediate risk.  He had repeat cardiac cath in November 2015.  He was found to have severe single-vessel disease involving the bifurcation of OM1 and OM 2 with 99% in-stent restenosis in the bare-metal stent placed in the OM 2. He underwent successful PCI spanning the proximal circumflex stent across OM 1 through the bare-metal stent placed in OM 2 with a Xience alpine DES stent. He was also noted to have a widely patent stent in the RCA and proximal circumflex with otherwise mild-moderate disease. He has preserved LVEF with an ejection fraction of 55-65%. He was instructed to continue with DAPT with ASA + Effient.   He was admitted in September with acute respiratory failure and hypoxia. Patient intially was hypoxic to 80s and required bipap shortly after admission. Admission labs were notable for BNP of 145 and troponin levels that trended flat (8, 12 and 12). He had a normal CXR and RVP on admission.  Patient had no signs of a pulmonary embolism with lack of tachycardia hemoptysis and a well score 0.  CHF unlikely because patient was not fluid overloaded on exam.  Patient was given IV steroids, duo nebs, was placed on azithromycin. Echocardiogram was completed and showed left ventricular ejection fraction 60 to 65% with normal left ventricular function and mild left ventricular  hypertrophy.  Presence of grade 1 diastolic dysfunction. CTA showed no evidence of pulmonary embolism but did show evidence of consolidation/collapse of lateral right middle lobe. He was treated with antibiotics and steroids with improvement. Felt to be primarily a COPD exacerbation.   On follow up today he reports he is doing well. He denies any chest pain or SOB. He did quit smoking this year.   Weight is stable.  Still exercises 2-3 times a week. Now living in a town home with his support dog. He reports he is now on Ozempic along with a novolog and lantus pen.   Current Outpatient Medications  Medication Sig Dispense Refill   ACCU-CHEK FASTCLIX LANCETS MISC 1 Units by Percutaneous route 4 (four) times daily. 100 each 12   albuterol (PROAIR HFA) 108 (90 Base) MCG/ACT inhaler INHALE TWO PUFFS BY MOUTH EVERY 6 HOURS AS NEEDED FOR WHEEZING OR FOR SHORTNESS OF BREATH (Patient not taking: Reported on 09/15/2021) 8.5 g 6   aspirin 81 MG chewable tablet Chew 1 tablet (81 mg total) by mouth daily. 180 tablet 2   atorvastatin (LIPITOR) 80 MG tablet Take 1 tablet (80 mg total) by mouth daily. (Patient taking differently: Take 80 mg by mouth daily with supper.) 90 tablet 1   BD INSULIN SYRINGE U/F 31G X 5/16" 1 ML MISC USE UP TO FIVE TIMES A DAY WITH NOVOLOG AND LANTUS DOSING 100 each 1   Blood Glucose Monitoring Suppl (ACCU-CHEK GUIDE) w/Device KIT Check blood sugar once each morning 1 kit 0  buPROPion (WELLBUTRIN XL) 150 MG 24 hr tablet TAKE ONE TABLET BY MOUTH DAILY 90 tablet 2   carvedilol (COREG) 3.125 MG tablet TAKE ONE TABLET BY MOUTH TWICE A DAY WITH MEALS 180 tablet 3   citalopram (CELEXA) 20 MG tablet TAKE ONE TABLET BY MOUTH DAILY 90 tablet 1   Continuous Blood Gluc Receiver (FREESTYLE LIBRE 2 READER) DEVI Use as directed 1 each 0   Continuous Blood Gluc Sensor (FREESTYLE LIBRE 2 SENSOR) MISC Use as directed and replace every 14 days 2 each 2   empagliflozin (JARDIANCE) 25 MG TABS tablet Take 1  tablet (25 mg total) by mouth daily. 90 tablet 3   glucose blood (ACCU-CHEK AVIVA PLUS) test strip USE AS INSTRUCTED 100 strip 1   insulin aspart (NOVOLOG FLEXPEN) 100 UNIT/ML FlexPen Inject 20 Units into the skin 3 (three) times daily with meals. 15 mL 4   insulin glargine (LANTUS SOLOSTAR) 100 UNIT/ML Solostar Pen Inject 50 Units into the skin daily. 15 mL 2   Insulin Pen Needle (PEN NEEDLES) 32G X 4 MM MISC Use as directed with insulin four times daily 100 each 12   lisinopril (ZESTRIL) 2.5 MG tablet Take 1 tablet (2.5 mg total) by mouth in the morning and at bedtime. 90 tablet 3   Melatonin 2.5 MG CAPS Take 5 mg by mouth at bedtime.     metFORMIN (GLUCOPHAGE) 1000 MG tablet Take 1 tablet (1,000 mg total) by mouth 2 (two) times daily with a meal. 180 tablet 3   Miconazole Nitrate 2 % AERO Spray between toes every morning. (Patient taking differently: Apply 1 spray topically daily. Spray between toes every morning.) 133 g 2   mometasone-formoterol (DULERA) 200-5 MCG/ACT AERO Inhale 2 puffs into the lungs in the morning and at bedtime. 39 g 6   Multiple Vitamin (MULTIVITAMIN) tablet Take 1 tablet by mouth daily.     nitroGLYCERIN (NITROSTAT) 0.4 MG SL tablet DISSOLVE 1 TAB UNDER TONGUE FOR CHEST PAIN - IF PAIN REMAINS AFTER 5 MIN, CALL 911 AND REPEAT DOSE. MAX 3 TABS IN 15 MINUTES (Patient not taking: Reported on 09/15/2021) 100 tablet 0   Omega-3 Fatty Acids (FISH OIL) 1000 MG CAPS Take 1 capsule (1,000 mg total) by mouth daily.     prasugrel (EFFIENT) 10 MG TABS tablet Take 1 tablet (10 mg total) by mouth daily. 90 tablet 3   Semaglutide, 1 MG/DOSE, (OZEMPIC, 1 MG/DOSE,) 4 MG/3ML SOPN Inject 1 mg into the skin once a week. 3 mL 0   traMADol (ULTRAM) 50 MG tablet TAKE ONE TABLET BY MOUTH EVERY 6 HOURS AS NEEDED 30 tablet 0   Zoster Vaccine Adjuvanted [facility]) injection Inject 0.5 ml IM and Repeat in 2 months (Patient not taking: Reported on 09/15/2021) 0.5 mL 1   No current facility-administered  medications for this visit.    Allergies  Allergen Reactions   Shellfish Allergy Nausea And Vomiting    OYSTERS    Social History   Socioeconomic History   Marital status: Single    Spouse name: Not on file   Number of children: 0   Years of education: 15   Highest education level: Not on file  Occupational History   Occupation: Unemployed    Comment: Used to work for [employer]   Occupation: Consulting civil engineer, Insurance underwriter [employer]  Tobacco Use   Smoking status: Former    Packs/day: 1.00    Years: 42.00    Pack years: 42.00    Types: Cigarettes  Start date: 09/13/1975    Quit date: 01/25/2018    Years since quitting: 3.6   Smokeless tobacco: Former    Types: Chew   Tobacco comments:    Bupropion helping  Vaping Use   Vaping Use: Never used  Substance and Sexual Activity   Alcohol use: Not Currently    Alcohol/week: 6.0 standard drinks    Types: 6 Cans of beer per week    Comment: pt reports "I quit 66 days ago" (05/09/16)   Drug use: No    Comment: Crack Cocaine (denies 05/09/16)   Sexual activity: Not Currently  Other Topics Concern   Not on file  Social History Narrative   Single.  Lives with a roommate.  Ambulates independently.   Social Determinants of Health   Financial Resource Strain: Not on file  Food Insecurity: Not on file  Transportation Needs: Not on file  Physical Activity: Not on file  Stress: Not on file  Social Connections: Not on file  Intimate Partner Violence: Not on file     Review of Systems: AS noted in HPI All other systems reviewed and are otherwise negative except as noted above.  Blood pressure 131/70, pulse 76, height 5\' 7"  (1.702 m), weight 295 lb 6.4 oz (134 kg), SpO2 98 %.  GENERAL:  Well appearing obese WM in NAD HEENT:  PERRL, EOMI, sclera are clear. Oropharynx is clear. NECK:  No jugular venous distention, carotid upstroke brisk and symmetric, no bruits, no thyromegaly or adenopathy LUNGS:  Clear to auscultation  bilaterally CHEST:  Unremarkable HEART:  RRR,  PMI not displaced or sustained,S1 and S2 within normal limits, no S3, no S4: no clicks, no rubs, no murmurs ABD:  Soft, nontender. BS +, no masses or bruits. No hepatomegaly, no splenomegaly EXT:  2 + pulses throughout, no edema, no cyanosis no clubbing SKIN:  Warm and dry.  No rashes NEURO:  Alert and oriented x 3. Cranial nerves II through XII intact. PSYCH:  Cognitively intact      Laboratory data:  Lab Results  Component Value Date   WBC 15.4 (H) 05/13/2021   HGB 15.1 05/13/2021   HCT 45.4 05/13/2021   PLT 198 05/13/2021   GLUCOSE 148 (H) 05/14/2021   CHOL 94 (L) 05/09/2019   TRIG 143 05/09/2019   HDL 33 (L) 05/09/2019   LDLDIRECT 70 09/02/2020   LDLCALC 32 05/09/2019   ALT 21 02/17/2021   AST 19 02/17/2021   NA 135 05/14/2021   K 3.6 05/14/2021   CL 99 05/14/2021   CREATININE 0.78 05/14/2021   BUN 19 05/14/2021   CO2 28 05/14/2021   TSH 1.100 01/18/2012   INR 0.96 07/28/2014   HGBA1C 8.2 (A) 08/18/2021   MICROALBUR 4.7 05/23/2016     Echo 05/10/21: IMPRESSIONS     1. Left ventricular ejection fraction, by estimation, is 60 to 65%. The  left ventricle has normal function. The left ventricle has no regional  wall motion abnormalities. There is mild left ventricular hypertrophy.  Left ventricular diastolic parameters  are consistent with Grade I diastolic dysfunction (impaired relaxation).   2. Right ventricular systolic function is normal. The right ventricular  size is normal. Tricuspid regurgitation signal is inadequate for assessing  PA pressure.   3. The mitral valve is normal in structure. No evidence of mitral valve  regurgitation. No evidence of mitral stenosis.   4. The aortic valve is tricuspid. Aortic valve regurgitation is not  visualized. Mild aortic valve sclerosis is present,  with no evidence of  aortic valve stenosis.   5. The inferior vena cava is normal in size with <50% respiratory   variability, suggesting right atrial pressure of 8 mmHg.    ASSESSMENT AND PLAN:   1. CAD: Status post repeat PCI to the proximal circumflex and OM 2 in Nov. 2015 as outlined above with DES for instent restenosis. He is asymptomatic.  Continue dual antiplatelet therapy with aspirin plus Effient indefinitely due to extensive nature of stents.  Continue beta blocker, statin and ACE inhibitor for secondary prevention.  2. Hypertension: Blood pressure is well-controlled. Continue lisinopril and Coreg  3. Hyperlipidemia: on statin. Needs updated lab work.  4. Diabetes: per primary care. Encourage weight loss and continued physical exercise.   5. Tobacco abuse: patient has quit. Congratulated.   I will follow up in 6 months   Kosta Schnitzler [place] MD,FACC  10/05/2021 3:21 PM

## 2021-09-23 ENCOUNTER — Other Ambulatory Visit: Payer: Self-pay

## 2021-09-23 MED ORDER — ACCU-CHEK AVIVA PLUS VI STRP: ORAL_STRIP | 1 refills | Status: DC

## 2021-10-05 ENCOUNTER — Encounter: Payer: Self-pay | Admitting: Cardiology

## 2021-10-05 ENCOUNTER — Ambulatory Visit (INDEPENDENT_AMBULATORY_CARE_PROVIDER_SITE_OTHER): Payer: Medicaid Other | Admitting: Cardiology

## 2021-10-05 ENCOUNTER — Other Ambulatory Visit: Payer: Self-pay

## 2021-10-05 VITALS — BP 131/70 | HR 76 | Ht 67.0 in | Wt 295.4 lb

## 2021-10-05 DIAGNOSIS — Z9861 Coronary angioplasty status: Secondary | ICD-10-CM

## 2021-10-05 DIAGNOSIS — I10 Essential (primary) hypertension: Secondary | ICD-10-CM | POA: Diagnosis not present

## 2021-10-05 DIAGNOSIS — E782 Mixed hyperlipidemia: Secondary | ICD-10-CM

## 2021-10-05 DIAGNOSIS — I2510 Atherosclerotic heart disease of native coronary artery without angina pectoris: Secondary | ICD-10-CM

## 2021-10-05 DIAGNOSIS — E1165 Type 2 diabetes mellitus with hyperglycemia: Secondary | ICD-10-CM

## 2021-10-05 DIAGNOSIS — Z794 Long term (current) use of insulin: Secondary | ICD-10-CM

## 2021-10-05 DIAGNOSIS — Z720 Tobacco use: Secondary | ICD-10-CM | POA: Diagnosis not present

## 2021-10-05 MED ORDER — PRASUGREL HCL 10 MG PO TABS: 10.0000 mg | ORAL_TABLET | Freq: Every day | ORAL | 3 refills | Status: DC

## 2021-10-06 ENCOUNTER — Other Ambulatory Visit: Payer: Self-pay | Admitting: Family Medicine

## 2021-10-06 DIAGNOSIS — Z9861 Coronary angioplasty status: Secondary | ICD-10-CM

## 2021-10-06 DIAGNOSIS — H906 Mixed conductive and sensorineural hearing loss, bilateral: Secondary | ICD-10-CM | POA: Insufficient documentation

## 2021-10-06 DIAGNOSIS — E785 Hyperlipidemia, unspecified: Secondary | ICD-10-CM

## 2021-10-06 LAB — BASIC METABOLIC PANEL
BUN/Creatinine Ratio: 16 (ref 10–24)
BUN: 12 mg/dL (ref 8–27)
CO2: 24 mmol/L (ref 20–29)
Calcium: 9.3 mg/dL (ref 8.6–10.2)
Chloride: 105 mmol/L (ref 96–106)
Creatinine, Ser: 0.74 mg/dL — ABNORMAL LOW (ref 0.76–1.27)
Glucose: 113 mg/dL — ABNORMAL HIGH (ref 70–99)
Potassium: 4.6 mmol/L (ref 3.5–5.2)
Sodium: 143 mmol/L (ref 134–144)
eGFR: 104 mL/min/{1.73_m2} (ref >59)

## 2021-10-06 LAB — LIPID PANEL
Chol/HDL Ratio: 2.5 ratio (ref 0.0–5.0)
Cholesterol, Total: 91 mg/dL — ABNORMAL LOW (ref 100–199)
HDL: 37 mg/dL — ABNORMAL LOW (ref >39)
LDL Chol Calc (NIH): 40 mg/dL (ref 0–99)
Triglycerides: 58 mg/dL (ref 0–149)
VLDL Cholesterol Cal: 14 mg/dL (ref 5–40)

## 2021-10-06 LAB — HEPATIC FUNCTION PANEL
ALT: 19 IU/L (ref 0–44)
AST: 18 IU/L (ref 0–40)
Albumin: 4.5 g/dL (ref 3.8–4.9)
Alkaline Phosphatase: 126 IU/L — ABNORMAL HIGH (ref 44–121)
Bilirubin Total: 0.4 mg/dL (ref 0.0–1.2)
Bilirubin, Direct: 0.12 mg/dL (ref 0.00–0.40)
Total Protein: 7 g/dL (ref 6.0–8.5)

## 2021-10-06 LAB — HEMOGLOBIN A1C
Est. average glucose Bld gHb Est-mCnc: 180 mg/dL
Hgb A1c MFr Bld: 7.9 % — ABNORMAL HIGH (ref 4.8–5.6)

## 2021-10-11 ENCOUNTER — Ambulatory Visit (INDEPENDENT_AMBULATORY_CARE_PROVIDER_SITE_OTHER): Payer: Medicaid Other | Admitting: Pharmacist

## 2021-10-11 ENCOUNTER — Other Ambulatory Visit: Payer: Self-pay

## 2021-10-11 VITALS — Wt 297.0 lb

## 2021-10-11 DIAGNOSIS — E1110 Type 2 diabetes mellitus with ketoacidosis without coma: Secondary | ICD-10-CM | POA: Diagnosis not present

## 2021-10-11 DIAGNOSIS — Z794 Long term (current) use of insulin: Secondary | ICD-10-CM

## 2021-10-11 DIAGNOSIS — E1165 Type 2 diabetes mellitus with hyperglycemia: Secondary | ICD-10-CM

## 2021-10-11 MED ORDER — ACCU-CHEK SOFTCLIX LANCETS MISC: 12 refills | Status: DC

## 2021-10-11 MED ORDER — ACCU-CHEK GUIDE VI STRP: ORAL_STRIP | 12 refills | Status: DC

## 2021-10-11 MED ORDER — OZEMPIC (2 MG/DOSE) 8 MG/3ML ~~LOC~~ SOPN: 2.0000 mg | PEN_INJECTOR | SUBCUTANEOUS | 3 refills | Status: DC

## 2021-10-11 NOTE — Patient Instructions (Addendum)
[name] [name] it was a pleasure seeing you today.   Please do the following:  Take your last dose of Ozempic on 2/6 and then start the new Ozempic 2mg  pen on 2/13 as directed today during your appointment. If you have any questions or if you believe something has occurred because of this change, call me or your doctor to let one of [place] know.  Continue checking blood sugars at home. It's really important that you record these and bring these in to your next doctor's appointment.  Continue making the lifestyle changes we've discussed together during our visit. Diet and exercise play a significant role in improving your blood sugars.  Speak to your pharmacy about sending the doctor's office a prior authorization for your continuous glucose monitor that sits on your arm Follow-up with on 2/15/   Hypoglycemia or low blood sugar:   Low blood sugar can happen quickly and may become an emergency if not treated right away.   While this shouldn't happen often, it can be brought upon if you skip a meal or do not eat enough. Also, if your insulin or other diabetes medications are dosed too high, this can cause your blood sugar to go to low.   Warning signs of low blood sugar include: Feeling shaky or dizzy Feeling weak or tired  Excessive hunger Feeling anxious or upset  Sweating even when you aren't exercising  What to do if I experience low blood sugar? Follow the Rule of 15 Check your blood sugar with your meter. If lower than 70, proceed to step 2.  Treat with 15 grams of fast acting carbs which is found in 3-4 glucose tablets. If none are available you can try hard candy, 1 tablespoon of sugar or honey,4 ounces of fruit juice, or 6 ounces of REGULAR soda.  Re-check your sugar in 15 minutes. If it is still below 70, do what you did in step 2 again. If your blood sugar has come back up, go ahead and eat a snack or small meal made up of complex carbs (ex. Whole grains) and protein at this time to avoid  recurrence of low blood sugar.

## 2021-10-11 NOTE — Progress Notes (Signed)
Subjective:    Patient ID: [name] [name], male    DOB: 11/03/1960, 61 y.o.   MRN: [id number]  HPI Patient is a 61 y.o. male who presents for diabetes management. He is in good spirits and presents without assistance. Patient was referred and last seen by Primary Care Provider on 08/18/21.  Patient reports diabetes was diagnosed around 10 years ago.   Insurance coverage/medication affordability: [employer] Medicaid  Family/Social history: lives with sister  Current diabetes medications include: Novolog 20 units three times a day, Lantus 50 units once daily, jardiance 25mg , metformin 1000mg  twice daily, and Ozempic 1mg  once weekly on Mondays (completed 3 doses) Current hypertension medications include: lisinopril 2.5mg , carvedilol 3.125mg  twice daily Current hyperlipidemia medications include: atorvastatin 80mg  Patient states that He is taking his medications as prescribed. Patient reports adherence with medications.   Do you feel that your medications are working for you?  yes  Have you been experiencing any side effects to the medications prescribed? no  Do you have any problems obtaining medications due to transportation or finances?  no    Patient reported dietary habits:  Eats 3 meals/day and 2 snacks/day; Boluses with 3 meals/day and 0 snacks/day Breakfast: 2 PB&J sandwich + 2% milk Lunch: ham or [place] and cheese sandwich Dinner: st[employer] TV dinner Snacks: sandwiches, swiss roll, ice cream Drinks:diet gingerale, water, on occasion will have coffee or hot tea  Patient-reported exercise habits: denies since moving   Patient reports hypoglycemic event of 59 that occurred at 3:30 AM likely due to waking up and having a snack and taking insulin Patient reports polyuria (increased urination).  Patient reports polyphagia (increased appetite).  Patient reports polydipsia (increased thirst).  Patient denies neuropathy (nerve pain). Patient denies visual changes. Patient reports self foot  exams.   Objective:   Home fasting blood sugars: 187, 135, 129, 139, 117, 93, 128, 123, 162 2 hour post-meal/random blood sugars: 140, 145, 163, 111, 105, 104, 166, 94, 152, 159  Labs:   Physical Exam Neurological:     Mental Status: He is alert and oriented to person, place, and time.    Review of Systems  Gastrointestinal:  Negative for nausea and vomiting.   Lab Results  Component Value Date   HGBA1C 7.9 (H) 10/05/2021   HGBA1C 8.2 (A) 08/18/2021   HGBA1C 7.8 (A) 05/18/2021   Lab Results  Component Value Date   MICRALBCREAT 25 05/23/2016    Lipid Panel     Component Value Date/Time   CHOL 91 (L) 10/05/2021 1531   TRIG 58 10/05/2021 1531   HDL 37 (L) 10/05/2021 1531   CHOLHDL 2.5 10/05/2021 1531   CHOLHDL 3.7 03/29/2016 1227   VLDL 53 (H) 03/29/2016 1227   LDLCALC 40 10/05/2021 1531   LDLDIRECT 70 09/02/2020 1057    Clinical Atherosclerotic Cardiovascular Disease (ASCVD): Yes  The ASCVD Risk score (Arnett DK, et al., 2019) failed to calculate for the following reasons:   The patient has a prior MI or stroke diagnosis   Assessment/Plan:   T2DM is not controlled likely due to sub-optimal lifestyle, but is improving. Medication adherence appears optimal. Patient tolerating Ozempic 1mg  well therefore will have him complete his last dose and then start Ozempic 2mg  once weekly the following Monday. Discussed with patient importance of not taking additional Novolog as this was the cause of his hypoglycemia. Waiting on PA information from pharmacy regarding CGM. Patient stated he will discuss with them. Following instruction patient verbalized understanding of treatment plan.  Continued basal insulin to Lantus 50 units once daily in the morning Continued  rapid insulin Novolog 20 units three times daily Continued GLP-1 Ozempic 1mg  once weekly x1 more dose then increase to 2mg  once weekly Continued SGLT2-I Jardiance 25mg  once daily Continued metformin 1000mg  twice  daily Extensively discussed pathophysiology of diabetes, dietary effects on blood sugar control, and recommended lifestyle interventions. Counseled on s/sx of and management of hypoglycemia Next A1C anticipated March 2023.   Follow-up appointment 2 weeks to review sugar readings. Written patient instructions provided.  This appointment required 30 minutes of direct patient care.  Thank you for involving pharmacy to assist in providing this patient's care.

## 2021-10-11 NOTE — Assessment & Plan Note (Signed)
T2DM is not controlled likely due to sub-optimal lifestyle, but is improving. Medication adherence appears optimal. Patient tolerating Ozempic 1mg  well therefore will have him complete his last dose and then start Ozempic 2mg  once weekly the following Monday. Discussed with patient importance of not taking additional Novolog as this was the cause of his hypoglycemia. Waiting on PA information from pharmacy regarding CGM. Patient stated he will discuss with them. Following instruction patient verbalized understanding of treatment plan.    1. Continued basal insulin to Lantus 50 units once daily in the morning 2. Continued  rapid insulin Novolog 20 units three times daily 3. Continued GLP-1 Ozempic 1mg  once weekly x1 more dose then increase to 2mg  once weekly 4. Continued SGLT2-I Jardiance 25mg  once daily 5. Continued metformin 1000mg  twice daily 6. Extensively discussed pathophysiology of diabetes, dietary effects on blood sugar control, and recommended lifestyle interventions. 7. Counseled on s/sx of and management of hypoglycemia 8. Next A1C anticipated March 2023.   Follow-up appointment 2 weeks to review sugar readings.

## 2021-10-12 ENCOUNTER — Other Ambulatory Visit: Payer: Self-pay | Admitting: Family Medicine

## 2021-10-12 ENCOUNTER — Telehealth: Payer: Self-pay

## 2021-10-12 ENCOUNTER — Other Ambulatory Visit (HOSPITAL_COMMUNITY): Payer: Self-pay

## 2021-10-12 NOTE — Telephone Encounter (Signed)
A Prior Authorization was initiated for this patients FREESTYLE LIBRE 2 SENSOR through CoverMyMeds.   Key: [id number]

## 2021-10-13 NOTE — Telephone Encounter (Signed)
Prior Auth for patients medication FREESTYLE LIBRE 2 SENSOR KIT approved by [place] (MEDICAID) from 10/12/21 to 04/11/22.   CoverMyMeds Key:  [id number]

## 2021-10-26 LAB — HM DIABETES EYE EXAM

## 2021-10-27 ENCOUNTER — Ambulatory Visit (INDEPENDENT_AMBULATORY_CARE_PROVIDER_SITE_OTHER): Payer: Medicaid Other | Admitting: Pharmacist

## 2021-10-27 ENCOUNTER — Other Ambulatory Visit: Payer: Self-pay

## 2021-10-27 DIAGNOSIS — Z794 Long term (current) use of insulin: Secondary | ICD-10-CM | POA: Diagnosis not present

## 2021-10-27 DIAGNOSIS — E1165 Type 2 diabetes mellitus with hyperglycemia: Secondary | ICD-10-CM

## 2021-10-27 NOTE — Patient Instructions (Addendum)
[name] it was a pleasure seeing you today.    The sensor is small waterproof disc that is placed on the back of the upper arm.  There is a very thin filament that is inserted under the surface of the skin and measures the amount of glucose in the interstitial fluid.  This system collects your sugar levels for up to 14 days and it automatically records the glucose level every 15 minutes. This will show your provider any patterns in your glucose levels.   Please remember... 1. Sensor will last 14 days 2. Sensor should be applied to area away from scarring, tattoos, irritation, and bones. 3. Starting the sensor: 1 hour warm up before BG readings available   4. Scan the sensor at least every 8 hours 5. Hold reader within 1.5 inches of sensor to scan 6. When the blood drop and magnifying glass symbol appears, test fingerstick blood glucose prior to making treatment decisions 7. Do a fingerstick blood glucose test if the sensor readings do not match how you feel 8. Remove sensor prior to magnetic resonance imaging (MRI), computed tomography (CT) scan, or high-frequency electrical heat (diathermy) treatment. 9. Freestyle Libre may be worn through a Environmental education officer. It may not be exposed to an advanced Imaging Technology (AIT) body scanner (also called a millimeter wave scanner) or the baggage x-ray machine. Instead, ask for hand-wanding or full-body pat-down and visual inspection.  10. Doses of vitamin C (ascorbic acid) >500 mg every day may cause false high readings. 11. Do not submerge more than 3 feet or keep underwater longer than 30 minutes at a time. Gently pat to dry.  12. Store sensor kit between 39 and 77 degrees Farenheit. Can be refrigerated within this temperature range.   Problems with Freestyle Libre sticking? 1. Order Tegaderm I.V. films to place directly over [facility] sensor on arm. 2. May also order Skin Tac from [facility]. Alcohol swab area you plan to administer  Freestyle Libre then let dry. Once dry, apply Skin Tac in a circular motion (with a spot in the middle for sensor without skin tac) and let dry. Once dry you can apply Freestyle Libre    Problems taking off Freestyle [place]? 1. Remember to try to shower before removing Freestyle Libre 2. Order Tac Away to help remove any extra adhesive left on your skin once you remove Freestyle Libre 3. May also try baby oil to loosen adhesive   [place] Phone number: [phone] Available 7 days a week; excluding holidays 8 AM to 8PM EST  [email or web address]   Please do the following:   Decrease your Novolog to 16 units three times a day with your meals and continue all your other medications as is. If you have any questions or if you believe something has occurred because of this change, call me or your doctor to let one of [place] know.  Continue checking blood sugars at home. It's really important that you record these and bring these in to your next doctor's appointment.  Continue making the lifestyle changes we've discussed together during our visit. Diet and exercise play a significant role in improving your blood sugars.  Follow-up with me in three weeks.    Hypoglycemia or low blood sugar:    Low blood sugar can happen quickly and may become an emergency if not treated right away.    While this shouldn't happen often, it can be brought upon if you skip a meal or do not  eat enough. Also, if your insulin or other diabetes medications are dosed too high, this can cause your blood sugar to go to low.    Warning signs of low blood sugar include: Feeling shaky or dizzy Feeling weak or tired  Excessive hunger Feeling anxious or upset  Sweating even when you aren't exercising   What to do if I experience low blood sugar? Follow the Rule of 15 Check your blood sugar. If lower than 70, proceed to step 2.  Treat with 15 grams of fast acting carbs which is found in 3-4 glucose  tablets. If none are available you can try hard candy, 1 tablespoon of sugar or honey,4 ounces of fruit juice, or 6 ounces of REGULAR soda.  Re-check your sugar in 15 minutes. If it is still below 70, do what you did in step 2 again. If your blood sugar has come back up, go ahead and eat a snack or small meal made up of complex carbs (ex. Whole grains) and protein at this time to avoid recurrence of low blood sugar.

## 2021-10-27 NOTE — Assessment & Plan Note (Signed)
T2DM is not controlled likely due to sub-optimal lifestyle, but is greatly improving based on CGM data. Medication adherence appears optimal. Will continue patient on max dose Ozempic 2mg  as he is tolerating well. Will decrease his Novolog due to a few episodes of hypoglycemia which may be caused from too much bolus insulin. Will decrease from 20 units three times a day to 16 units three times day. Following instruction patient verbalized understanding of treatment plan.    1. Continued basal insulin to Lantus 50 units once daily in the morning 2. Decreased dose of  rapid insulin Novolog to 16 units three times daily 3. Continued GLP-1 Ozempic 2mg  once weekly 4. Continued SGLT2-I Jardiance 25mg  once daily 5. Continued metformin 1000mg  twice daily 6. Extensively discussed pathophysiology of diabetes, dietary effects on blood sugar control, and recommended lifestyle interventions. 7. Counseled on s/sx of and management of hypoglycemia 8. Next A1C anticipated March 2023.   Follow-up appointment 3 weeks to review sugar readings.

## 2021-10-27 NOTE — Progress Notes (Signed)
Subjective:    Patient ID: [name] [name], male    DOB: [birth year], 61 y.o.   MRN: [id number]  HPI Patient is a 61 y.o. male who presents for diabetes management. He is in good spirits and presents without assistance. Patient was referred and last seen by Primary Care Provider on 08/18/21. Last seen in pharmacy clinic on 10/11/21.  Patient reports diabetes was diagnosed around 10 years ago.   Insurance coverage/medication affordability: [employer] Medicaid  Family/Social history: lives with sister  Current diabetes medications include: Novolog 20 units three times a day, Lantus 50 units once daily, jardiance 25mg , metformin 1000mg  twice daily, and Ozempic 2mg  once weekly on Mondays (completed 3 doses) Current hypertension medications include: lisinopril 2.5mg , carvedilol 3.125mg  twice daily Current hyperlipidemia medications include: atorvastatin 80mg  Patient states that He is taking his medications as prescribed. Patient reports adherence with medications.   Do you feel that your medications are working for you?  yes  Have you been experiencing any side effects to the medications prescribed? no  Do you have any problems obtaining medications due to transportation or finances?  no    Patient reported dietary habits:  Eats 3 meals/day and 2 snacks/day; [place] with 3 meals/day and 0 snacks/day Breakfast: 2 PB&J sandwich + 2% milk Lunch: ham or [place] and cheese sandwich Dinner: [employer] TV dinner Snacks: sandwiches, swiss roll, ice cream Drinks:diet gingerale, water, on occasion will have coffee or hot tea  Patient-reported exercise habits: denies since moving   Patient reports hypoglycemic events during daytime/evening. Patient reports polyuria (increased urination).  Patient reports polyphagia (increased appetite).  Patient reports polydipsia (increased thirst).  Patient denies neuropathy (nerve pain). Patient denies visual changes. Patient reports self foot exams.   Objective:    14 Day [employer]:   Physical Exam Neurological:     Mental Status: He is alert and oriented to person, place, and time.    Review of Systems  Gastrointestinal:  Negative for nausea and vomiting.   Lab Results  Component Value Date   HGBA1C 7.9 (H) 10/05/2021   HGBA1C 8.2 (A) 08/18/2021   HGBA1C 7.8 (A) 05/18/2021   Lab Results  Component Value Date   MICRALBCREAT 25 05/23/2016    Lipid Panel     Component Value Date/Time   CHOL 91 (L) 10/05/2021 1531   TRIG 58 10/05/2021 1531   HDL 37 (L) 10/05/2021 1531   CHOLHDL 2.5 10/05/2021 1531   CHOLHDL 3.7 03/29/2016 1227   VLDL 53 (H) 03/29/2016 1227   LDLCALC 40 10/05/2021 1531   LDLDIRECT 70 09/02/2020 1057    Clinical Atherosclerotic Cardiovascular Disease (ASCVD): Yes  The ASCVD Risk score (Arnett DK, et al., 2019) failed to calculate for the following reasons:   The patient has a prior MI or stroke diagnosis   Assessment/Plan:   T2DM is not controlled likely due to sub-optimal lifestyle, but is greatly improving based on CGM data. Medication adherence appears optimal. Will continue patient on max dose Ozempic 2mg  as he is tolerating well. Will decrease his Novolog due to a few episodes of hypoglycemia which may be caused from too much bolus insulin. Will decrease from 20 units three times a day to 16 units three times day. Following instruction patient verbalized understanding of treatment plan.    Continued basal insulin to Lantus 50 units once daily in the morning Decreased dose of  rapid insulin Novolog to 16 units three times daily Continued GLP-1 Ozempic 2mg  once weekly Continued SGLT2-I Jardiance  25mg  once daily Continued metformin 1000mg  twice daily Extensively discussed pathophysiology of diabetes, dietary effects on blood sugar control, and recommended lifestyle interventions. Counseled on s/sx of and management of hypoglycemia Next A1C anticipated March 2023.   Follow-up appointment 3 weeks to  review sugar readings. Written patient instructions provided.  This appointment required 30 minutes of direct patient care.  Thank you for involving pharmacy to assist in providing this patient's care.

## 2021-11-01 ENCOUNTER — Other Ambulatory Visit: Payer: Self-pay | Admitting: Family Medicine

## 2021-11-01 DIAGNOSIS — I214 Non-ST elevation (NSTEMI) myocardial infarction: Secondary | ICD-10-CM

## 2021-11-01 DIAGNOSIS — I10 Essential (primary) hypertension: Secondary | ICD-10-CM

## 2021-11-08 ENCOUNTER — Other Ambulatory Visit (HOSPITAL_COMMUNITY): Payer: Self-pay

## 2021-11-09 ENCOUNTER — Other Ambulatory Visit: Payer: Self-pay | Admitting: Family Medicine

## 2021-11-09 DIAGNOSIS — Z720 Tobacco use: Secondary | ICD-10-CM

## 2021-11-12 ENCOUNTER — Ambulatory Visit: Payer: Medicaid Other | Admitting: Podiatry

## 2021-11-12 ENCOUNTER — Other Ambulatory Visit: Payer: Self-pay | Admitting: Family Medicine

## 2021-11-15 ENCOUNTER — Ambulatory Visit (INDEPENDENT_AMBULATORY_CARE_PROVIDER_SITE_OTHER): Payer: Medicaid Other | Admitting: Family Medicine

## 2021-11-15 ENCOUNTER — Encounter: Payer: Self-pay | Admitting: Family Medicine

## 2021-11-15 ENCOUNTER — Other Ambulatory Visit: Payer: Self-pay

## 2021-11-15 VITALS — BP 119/60 | HR 64 | Ht 67.0 in | Wt 282.8 lb

## 2021-11-15 DIAGNOSIS — Z6841 Body Mass Index (BMI) 40.0 and over, adult: Secondary | ICD-10-CM

## 2021-11-15 DIAGNOSIS — I10 Essential (primary) hypertension: Secondary | ICD-10-CM

## 2021-11-15 DIAGNOSIS — Z794 Long term (current) use of insulin: Secondary | ICD-10-CM

## 2021-11-15 DIAGNOSIS — J449 Chronic obstructive pulmonary disease, unspecified: Secondary | ICD-10-CM

## 2021-11-15 DIAGNOSIS — F32A Depression, unspecified: Secondary | ICD-10-CM

## 2021-11-15 DIAGNOSIS — E1165 Type 2 diabetes mellitus with hyperglycemia: Secondary | ICD-10-CM

## 2021-11-15 LAB — POCT GLYCOSYLATED HEMOGLOBIN (HGB A1C): HbA1c, POC (controlled diabetic range): 6.9 % (ref 0.0–7.0)

## 2021-11-15 MED ORDER — ZOSTER VAC RECOMB ADJUVANTED 50 MCG/0.5ML IM SUSR: INTRAMUSCULAR | 1 refills | Status: DC

## 2021-11-15 MED ORDER — DULERA 200-5 MCG/ACT IN AERO: 2.0000 | INHALATION_SPRAY | Freq: Two times a day (BID) | RESPIRATORY_TRACT | 6 refills | Status: DC

## 2021-11-15 NOTE — Progress Notes (Signed)
? ? ?  SUBJECTIVE:  ? ?CHIEF COMPLAINT / HPI:  ? ? ? ? ?SUBJECTIVE:  ? ?CHIEF COMPLAINT / HPI:  ? ?Diabetes ?Taking Lantus 50 units once daily in the morning, Novolog to 16 units three times daily, GLP-1 Ozempic '2mg'$  once weekly, SGLT2-I Jardiance '25mg'$  once daily,  metformin '1000mg'$  twice daily ?Lowest blood sugar is 70.   Sometimes does not take his novolog if he feels is too low ? ?Hypertension ?Takes lisinopril daily.   Brought in all his medications  ? ?Cholesterol ?Lab Results  ?Component Value Date  ? CHOL 91 (L) 10/05/2021  ? HDL 37 (L) 10/05/2021  ? [place] 40 10/05/2021  ? LDLDIRECT 70 09/02/2020  ? TRIG 58 10/05/2021  ? CHOLHDL 2.5 10/05/2021  ?  ?Depression  ?No longer sees [facility].  Feels he is doing well  ? ? ? ?OBJECTIVE:  ? ?BP 119/60   Pulse 64   Ht '5\' 7"'$  (1.702 m)   Wt 282 lb 12.8 oz (128.3 kg)   SpO2 95%   BMI 44.29 kg/m?   ?Heart - Regular rate and rhythm.  No murmurs, gallops or rubs.    ?Lungs:  Normal respiratory effort, chest expands symmetrically. Lungs are clear to auscultation, no crackles or wheezes. ?Extremities:  No cyanosis, edema, or deformity noted with good range of motion of all major joints.   ? ? ?ASSESSMENT/PLAN:  ? ?Essential hypertension ?Stable continue current medications ? ? ?Type 2 diabetes mellitus with hyperglycemia, with long-term current use of insulin ([place]) ?Improved on ozempic with weight loss and lowering blood sugar.  Continue to lower his novolog dose with goal to eventually stop  ? ?Obesity ?Improving on Ozempic.  Has lost 15 lbs  ? ?Depression, acute ?Stable.  Consider weaning off wellbutrin starting next visit  ?  ? ? ?Lind Covert, MD ?[place]  ?

## 2021-11-15 NOTE — Assessment & Plan Note (Signed)
Stable continue current medications

## 2021-11-15 NOTE — Assessment & Plan Note (Signed)
Improved on ozempic with weight loss and lowering blood sugar.  Continue to lower his novolog dose with goal to eventually stop  ?

## 2021-11-15 NOTE — Patient Instructions (Addendum)
Good to see you today - Thank you for coming in ? ?Things we discussed today: ? ?Diabetes ?A1c is 6.9 ?Drop the Novolog to 14 u three times a day  ?Can continue to lower as long as your blood sugar are controlled ?The goal is to stop the novolog completely  ? ?I sent a prescription to your [facility] pharmacy for your Zoster vaccines to help prevent Shingles.  The shot may cause a sore arm and mild flu like symptoms for a few days.  You will need a second shot 2 months after your first.   ? ?Please always bring your medication bottles ? ?Come back to see me in 3 months ?

## 2021-11-15 NOTE — Assessment & Plan Note (Signed)
Stable.  Consider weaning off wellbutrin starting next visit  ?

## 2021-11-15 NOTE — Assessment & Plan Note (Signed)
Improving on Ozempic.  Has lost 15 lbs  ?

## 2021-11-23 ENCOUNTER — Encounter: Payer: Self-pay | Admitting: Podiatry

## 2021-11-23 ENCOUNTER — Ambulatory Visit (INDEPENDENT_AMBULATORY_CARE_PROVIDER_SITE_OTHER): Payer: Medicaid Other | Admitting: Podiatry

## 2021-11-23 ENCOUNTER — Other Ambulatory Visit: Payer: Self-pay

## 2021-11-23 DIAGNOSIS — M79675 Pain in left toe(s): Secondary | ICD-10-CM | POA: Diagnosis not present

## 2021-11-23 DIAGNOSIS — E119 Type 2 diabetes mellitus without complications: Secondary | ICD-10-CM

## 2021-11-23 DIAGNOSIS — B351 Tinea unguium: Secondary | ICD-10-CM

## 2021-11-23 DIAGNOSIS — Z794 Long term (current) use of insulin: Secondary | ICD-10-CM | POA: Diagnosis not present

## 2021-11-23 DIAGNOSIS — M79674 Pain in right toe(s): Secondary | ICD-10-CM

## 2021-11-29 NOTE — Progress Notes (Signed)
?  Subjective:  ?Patient ID: [name] [name], male    DOB: 08-Feb-1961,  MRN: [id number] ? ?Mike Hamre presents to clinic today for preventative diabetic foot care and painful elongated mycotic toenails 1-5 bilaterally which are tender when wearing enclosed shoe gear. Pain is relieved with periodic professional debridement. ? ?Patient states blood glucose was 136 mg/dl today.  ? ?New problem(s): None.  ? ?Patient states he is now going to the gym. He enjoys working out. ? ?PCP is Lind Covert, MD , and last visit was November 15, 2021. ? ?Allergies  ?Allergen Reactions  ? Shellfish Allergy Nausea And Vomiting  ?  OYSTERS  ? ? ?Review of Systems: Negative except as noted in the HPI. ? ?Objective: No changes noted in today's physical examination. ?[name] [name] is a pleasant 61 y.o. male, morbidly obese, in NAD. AAO X 3. ? ?Vascular Examination: ?CFT immediate b/l LE. Palpable DP/PT pulses b/l LE. Digital hair sparse b/l. Skin temperature gradient WNL b/l. No pain with calf compression b/l. No edema noted b/l. No cyanosis or clubbing noted b/l LE. ? ?Dermatological Examination: ?Pedal skin warm and supple b/l.  No open wounds b/l. No interdigital macerations. Toenails 1-5 b/l elongated, thickened, discolored with subungual debris. +Tenderness with dorsal palpation of nailplates.No hyperkeratotic nor porokeratotic lesions noted b/l. ? ?Musculoskeletal Examination: ?Normal muscle strength 5/5 to all lower extremity muscle groups bilaterally. No pain, crepitus or joint limitation noted with ROM b/l LE. No gross bony pedal deformities b/l. Patient ambulates independently without assistive aids. ? ?Neurological Examination: ?Protective sensation diminished with 10g monofilament b/l. ?Hemoglobin A1C Latest Ref Rng & Units 11/15/2021 10/05/2021 08/18/2021 05/18/2021 02/17/2021  ?HGBA1C 0.0 - 7.0 % 6.9 7.9(H) 8.2(A) 7.8(A) 7.7(A)  ?Some recent data might be hidden  ? ?Assessment/Plan: ?1. Pain due to onychomycosis of toenails of both  feet   ?2. Type 2 diabetes mellitus without complication, with long-term current use of insulin ([place])   ?  ?-Consent given for treatment as described below: ?-Toenails 1-5 b/l were debrided in length and girth with sterile nail nippers and dremel without iatrogenic bleeding.  ?-Patient/POA to call should there be question/concern in the interim.  ? ?Return in about 3 months (around 02/23/2022). ? ?Marzetta Board, DPM  ?

## 2021-12-13 ENCOUNTER — Other Ambulatory Visit: Payer: Self-pay | Admitting: Family Medicine

## 2021-12-13 DIAGNOSIS — F32A Depression, unspecified: Secondary | ICD-10-CM

## 2022-01-02 ENCOUNTER — Other Ambulatory Visit: Payer: Self-pay | Admitting: Family Medicine

## 2022-01-06 ENCOUNTER — Other Ambulatory Visit: Payer: Self-pay | Admitting: Family Medicine

## 2022-01-06 DIAGNOSIS — E1159 Type 2 diabetes mellitus with other circulatory complications: Secondary | ICD-10-CM

## 2022-01-27 ENCOUNTER — Other Ambulatory Visit: Payer: Self-pay | Admitting: Family Medicine

## 2022-02-14 ENCOUNTER — Other Ambulatory Visit: Payer: Self-pay | Admitting: Family Medicine

## 2022-02-16 ENCOUNTER — Ambulatory Visit (INDEPENDENT_AMBULATORY_CARE_PROVIDER_SITE_OTHER): Payer: Medicaid Other | Admitting: Family Medicine

## 2022-02-16 VITALS — BP 128/52 | HR 66 | Wt 280.6 lb

## 2022-02-16 DIAGNOSIS — I10 Essential (primary) hypertension: Secondary | ICD-10-CM

## 2022-02-16 DIAGNOSIS — E1165 Type 2 diabetes mellitus with hyperglycemia: Secondary | ICD-10-CM | POA: Diagnosis present

## 2022-02-16 DIAGNOSIS — J449 Chronic obstructive pulmonary disease, unspecified: Secondary | ICD-10-CM

## 2022-02-16 DIAGNOSIS — Z720 Tobacco use: Secondary | ICD-10-CM | POA: Diagnosis not present

## 2022-02-16 DIAGNOSIS — M159 Polyosteoarthritis, unspecified: Secondary | ICD-10-CM | POA: Diagnosis not present

## 2022-02-16 DIAGNOSIS — M1990 Unspecified osteoarthritis, unspecified site: Secondary | ICD-10-CM | POA: Insufficient documentation

## 2022-02-16 DIAGNOSIS — Z794 Long term (current) use of insulin: Secondary | ICD-10-CM | POA: Diagnosis not present

## 2022-02-16 LAB — POCT GLYCOSYLATED HEMOGLOBIN (HGB A1C): HbA1c, POC (controlled diabetic range): 8 % — AB (ref 0.0–7.0)

## 2022-02-16 MED ORDER — DULERA 200-5 MCG/ACT IN AERO: 2.0000 | INHALATION_SPRAY | Freq: Two times a day (BID) | RESPIRATORY_TRACT | 6 refills | Status: DC

## 2022-02-16 MED ORDER — ALBUTEROL SULFATE HFA 108 (90 BASE) MCG/ACT IN AERS: INHALATION_SPRAY | RESPIRATORY_TRACT | 6 refills | Status: DC

## 2022-02-16 MED ORDER — TRAMADOL HCL 50 MG PO TABS: 50.0000 mg | ORAL_TABLET | Freq: Four times a day (QID) | ORAL | 1 refills | Status: DC | PRN

## 2022-02-16 NOTE — Assessment & Plan Note (Signed)
Still smoke free.  Will continue wellbutrin per his wishes

## 2022-02-16 NOTE — Assessment & Plan Note (Signed)
Stable. He is slowly loosing weight and use tramadol as needed no more than 20 per month.   Will continue

## 2022-02-16 NOTE — Assessment & Plan Note (Signed)
Stable continue lisinopril

## 2022-02-16 NOTE — Progress Notes (Signed)
    SUBJECTIVE:   CHIEF COMPLAINT / HPI:   Diabetes Taking Lantus 50 units once daily in the morning, Has stopped Novolog completely.   Some days skips Lantus completely when he feels his blood sugar is Also takes GLP-1 Ozempic '2mg'$  once weekly, SGLT2-I Jardiance '25mg'$  once daily,  metformin '1000mg'$  twice daily Know his medications and brings in bottles  Hypertension Takes lisinopril daily.   No lightheadness or edema   Tobacco Completely smoke free.  Feels that wellbutrin helps and wishes to continue  PERTINENT  PMH / PSH: visiting his mom in [place] next month  OBJECTIVE:   BP (!) 128/52   Pulse 66   Wt 280 lb 9.6 oz (127.3 kg)   SpO2 96%   BMI 43.95 kg/m   Psych:  Cognition and judgment appear intact. Alert, communicative  and cooperative with normal attention span and concentration. No apparent delusions, illusions, hallucinations   ASSESSMENT/PLAN:   Degenerative joint disease Stable. He is slowly loosing weight and use tramadol as needed no more than 20 per month.   Will continue   Essential hypertension Stable continue lisinopril   Type 2 diabetes mellitus with hyperglycemia, with long-term current use of insulin ([facility]) A1c up a little likely from skipping lantus doses.  See after visit summary.  Has continued weight loss with ozempic   Tobacco abuse Still smoke free.  Will continue wellbutrin per his wishes    Patient Instructions  Good to see you today - Thank you for coming in  Things we discussed today:  Diabetes Take the lantus 50 u every day. If low blood sugar then call me and will lower it to 45 u   Have a good trip to [place]  Please always bring your medication bottles  Come back to see me in 3 months    Lind Covert, [place]

## 2022-02-16 NOTE — Patient Instructions (Signed)
Good to see you today - Thank you for coming in  Things we discussed today:  Diabetes Take the lantus 50 u every day. If low blood sugar then call me and will lower it to 45 u   Have a good trip to [place]  Please always bring your medication bottles  Come back to see me in 3 months

## 2022-02-16 NOTE — Assessment & Plan Note (Signed)
A1c up a little likely from skipping lantus doses.  See after visit summary.  Has continued weight loss with ozempic

## 2022-02-23 ENCOUNTER — Encounter: Payer: Self-pay | Admitting: Podiatry

## 2022-02-23 ENCOUNTER — Ambulatory Visit: Payer: Medicaid Other | Admitting: Podiatry

## 2022-02-23 DIAGNOSIS — Z794 Long term (current) use of insulin: Secondary | ICD-10-CM | POA: Diagnosis not present

## 2022-02-23 DIAGNOSIS — B351 Tinea unguium: Secondary | ICD-10-CM | POA: Diagnosis not present

## 2022-02-23 DIAGNOSIS — E119 Type 2 diabetes mellitus without complications: Secondary | ICD-10-CM | POA: Diagnosis not present

## 2022-02-23 DIAGNOSIS — M79674 Pain in right toe(s): Secondary | ICD-10-CM

## 2022-02-23 DIAGNOSIS — M79675 Pain in left toe(s): Secondary | ICD-10-CM

## 2022-02-28 NOTE — Progress Notes (Signed)
  Subjective:  Patient ID: [name] [name], male    DOB: 1961-03-13,  MRN: [id number]  61 y.o. male presents preventative diabetic foot care and painful thick toenails that are difficult to trim. Pain interferes with ambulation. Aggravating factors include wearing enclosed shoe gear. Pain is relieved with periodic professional debridement.  Patient states blood glucose was 119 mg/dl today.    New problem(s): None   Patient states he continues to work out at [employer].  PCP is Lind Covert, MD , and last visit was February 16, 2022.  Allergies  Allergen Reactions   Shellfish Allergy Nausea And Vomiting    OYSTERS   Review of Systems: Negative except as noted in the HPI.   Objective:  Vascular Examination: Vascular status intact b/l with palpable pedal pulses. CFT immediate b/l. No edema. No pain with calf compression b/l. Skin temperature gradient WNL b/l.   Neurological Examination:  Protective sensation diminished with 10g monofilament b/l.  Dermatological Examination: Pedal skin with normal turgor, texture and tone b/l. Toenails 1-5 b/l thick, discolored, elongated with subungual debris and pain on dorsal palpation. No hyperkeratotic lesions noted b/l.   Musculoskeletal Examination: Normal muscle strength 5/5 to all lower extremity muscle groups bilaterally. No pain, crepitus or joint limitation noted with ROM b/l LE. No gross bony pedal deformities b/l. Patient ambulates independently without assistive aids.  Radiographs: None  Last A1c:      Latest Ref Rng & Units 02/16/2022   10:58 AM 11/15/2021   10:44 AM 10/05/2021    3:31 PM 08/18/2021   11:13 AM 05/18/2021   11:03 AM  Hemoglobin A1C  Hemoglobin-A1c 0.0 - 7.0 % 8.0  6.9  7.9  8.2  7.8      Assessment:   1. Pain due to onychomycosis of toenails of both feet   2. Type 2 diabetes mellitus without complication, with long-term current use of insulin ([place])    Plan:  -Patient was evaluated and treated. All patient's and/or  POA's questions/concerns answered on today's visit. -Patient to continue soft, supportive shoe gear daily. -Mycotic toenails 1-5 bilaterally were debrided in length and girth with sterile nail nippers and dremel without incident. -Patient/POA to call should there be question/concern in the interim.  Return in about 3 months (around 05/26/2022).  Marzetta Board, DPM

## 2022-03-01 ENCOUNTER — Telehealth: Payer: Self-pay

## 2022-03-01 ENCOUNTER — Other Ambulatory Visit: Payer: Self-pay

## 2022-03-01 MED ORDER — FREESTYLE LIBRE 2 SENSOR MISC: 3 refills | Status: DC

## 2022-03-01 NOTE — Telephone Encounter (Signed)
Patient calls nurse line requesting returned phone call from Dr. Erin Hearing to discuss blood sugar levels. Patient reports that levels are consistently running below 100. Reports ranging from 55-98. Patient is asymptomatic.  Patient has not taken lantus yet this AM. Reports that AM his blood sugar level was 75 (fasting). Patient is currently at [employer] and reports that he has not yet ate breakfast.   Due to lower blood sugar level, recommended that patient consume 15 g carbohydrate (snack or juice)  Provided with precautions if patient were to become symptomatic.   Per note it appears that Lantus should be decreased to 45 units daily if blood sugar levels were low, however, I will route to PCP for clarification and any further recommendations.   Talbot Grumbling, RN

## 2022-03-01 NOTE — Telephone Encounter (Signed)
Please contact patient and let him know he should decrease he current lantus dose by 10 units every morning going forward  Thanks  [place]

## 2022-03-02 NOTE — Telephone Encounter (Signed)
Pt called back and informed. Lashunda Greis Kennon Holter, CMA

## 2022-03-02 NOTE — Telephone Encounter (Signed)
Attempted to call pt on both numbers and was unsuccessful. Will try again later. Roseanna Koplin Kennon Holter, CMA

## 2022-03-13 ENCOUNTER — Other Ambulatory Visit: Payer: Self-pay | Admitting: Family Medicine

## 2022-03-14 ENCOUNTER — Telehealth: Payer: Self-pay

## 2022-03-14 NOTE — Telephone Encounter (Signed)
Prior Auth for patients medication FREESTYLE LIBRE 2 SENSOR approved by [employer] from 03/14/22 to 03/15/23.  Key: [id number]

## 2022-03-14 NOTE — Telephone Encounter (Signed)
A Prior Authorization REAUTHORIZATION was initiated for this patients FREESTYLE LIBRE 2 SENOR through CoverMyMeds.   Key:  [id number]

## 2022-04-06 NOTE — Progress Notes (Signed)
04/11/2022 [name] [name]   Feb 04, 1961  [id number]  Primary Physician Lind Covert, MD Primary Cardiologist: [name]  HPI:  [name] [name] is seen today for follow up CAD. His past medical history is significant for CAD, DM, obesity, tobacco use , hyperlipidemia and hypertension. He  is status post stenting of the right coronary artery in May of 2013. He was admitted with a NSTEMI in February of 2015. He was found to have severe disease in LCx and OM2 treated with BMS.  He was placed on dual antiplatelet therapy with aspirin plus Effient.   He presented with increased angina in October 2015. A Myoview study was intermediate risk.  He had repeat cardiac cath in November 2015.  He was found to have severe single-vessel disease involving the bifurcation of OM1 and OM 2 with 99% in-stent restenosis in the bare-metal stent placed in the OM 2. He underwent successful PCI spanning the proximal circumflex stent across OM 1 through the bare-metal stent placed in OM 2 with a Xience alpine DES stent. He was also noted to have a widely patent stent in the RCA and proximal circumflex with otherwise mild-moderate disease. He has preserved LVEF with an ejection fraction of 55-65%. He was instructed to continue with DAPT with ASA + Effient.   He was admitted in September with acute respiratory failure and hypoxia. Patient intially was hypoxic to 80s and required bipap shortly after admission. Admission labs were notable for BNP of 145 and troponin levels that trended flat (8, 12 and 12). He had a normal CXR and RVP on admission.  Patient had no signs of a pulmonary embolism with lack of tachycardia hemoptysis and a well score 0.  CHF unlikely because patient was not fluid overloaded on exam.  Patient was given IV steroids, duo nebs, was placed on azithromycin. Echocardiogram was completed and showed left ventricular ejection fraction 60 to 65% with normal left ventricular function and mild left ventricular  hypertrophy.  Presence of grade 1 diastolic dysfunction. CTA showed no evidence of pulmonary embolism but did show evidence of consolidation/collapse of lateral right middle lobe. He was treated with antibiotics and steroids with improvement. Felt to be primarily a COPD exacerbation.   On follow up today he reports he is doing well. He denies any chest pain or SOB. He is no longer smoking.   Now living in a town home with his support dog. He reports he is now on Ozempic along with a novolog and lantus pen but states Ozempic is on back order. He reports he lost 25 lbs with this.    Current Outpatient Medications  Medication Sig Dispense Refill   Accu-Chek Softclix Lancets lancets Use as instructed to check three times daily 100 each 12   albuterol (PROAIR HFA) 108 (90 Base) MCG/ACT inhaler INHALE TWO PUFFS BY MOUTH EVERY 6 HOURS AS NEEDED FOR WHEEZING OR FOR SHORTNESS OF BREATH 8.5 g 6   aspirin 81 MG chewable tablet Chew 1 tablet (81 mg total) by mouth daily. 180 tablet 2   atorvastatin (LIPITOR) 80 MG tablet TAKE ONE TABLET BY MOUTH DAILY 90 tablet 1   Blood Glucose Monitoring Suppl (ACCU-CHEK GUIDE) w/Device KIT Check blood sugar once each morning 1 kit 0   buPROPion (WELLBUTRIN XL) 150 MG 24 hr tablet TAKE ONE TABLET BY MOUTH DAILY 90 tablet 2   carvedilol (COREG) 3.125 MG tablet TAKE ONE TABLET BY MOUTH TWICE A DAY WITH MEALS 180 tablet 3   CIPRODEX OTIC suspension  04/11/2022 [name] [name]   [birth year]  [id number]  Primary Physician Lind Covert, MD Primary Cardiologist: [name]  HPI:  [name] [name] is seen today for follow up CAD. His past medical history is significant for CAD, DM, obesity, tobacco use , hyperlipidemia and hypertension. He  is status post stenting of the right coronary artery in May of 2013. He was admitted with a NSTEMI in February of 2015. He was found to have severe disease in LCx and OM2 treated with BMS.  He was placed on dual antiplatelet therapy with aspirin plus Effient.   He presented with increased angina in October 2015. A Myoview study was intermediate risk.  He had repeat cardiac cath in November 2015.  He was found to have severe single-vessel disease involving the bifurcation of OM1 and OM 2 with 99% in-stent restenosis in the bare-metal stent placed in the OM 2. He underwent successful PCI spanning the proximal circumflex stent across OM 1 through the bare-metal stent placed in OM 2 with a Xience alpine DES stent. He was also noted to have a widely patent stent in the RCA and proximal circumflex with otherwise mild-moderate disease. He has preserved LVEF with an ejection fraction of 55-65%. He was instructed to continue with DAPT with ASA + Effient.   He was admitted in September with acute respiratory failure and hypoxia. Patient intially was hypoxic to 80s and required bipap shortly after admission. Admission labs were notable for BNP of 145 and troponin levels that trended flat (8, 12 and 12). He had a normal CXR and RVP on admission.  Patient had no signs of a pulmonary embolism with lack of tachycardia hemoptysis and a well score 0.  CHF unlikely because patient was not fluid overloaded on exam.  Patient was given IV steroids, duo nebs, was placed on azithromycin. Echocardiogram was completed and showed left ventricular ejection fraction 60 to 65% with normal left ventricular function and mild left ventricular  hypertrophy.  Presence of grade 1 diastolic dysfunction. CTA showed no evidence of pulmonary embolism but did show evidence of consolidation/collapse of lateral right middle lobe. He was treated with antibiotics and steroids with improvement. Felt to be primarily a COPD exacerbation.   On follow up today he reports he is doing well. He denies any chest pain or SOB. He is no longer smoking.   Now living in a town home with his support dog. He reports he is now on Ozempic along with a novolog and lantus pen but states Ozempic is on back order. He reports he lost 25 lbs with this.    Current Outpatient Medications  Medication Sig Dispense Refill   Accu-Chek Softclix Lancets lancets Use as instructed to check three times daily 100 each 12   albuterol (PROAIR HFA) 108 (90 Base) MCG/ACT inhaler INHALE TWO PUFFS BY MOUTH EVERY 6 HOURS AS NEEDED FOR WHEEZING OR FOR SHORTNESS OF BREATH 8.5 g 6   aspirin 81 MG chewable tablet Chew 1 tablet (81 mg total) by mouth daily. 180 tablet 2   atorvastatin (LIPITOR) 80 MG tablet TAKE ONE TABLET BY MOUTH DAILY 90 tablet 1   Blood Glucose Monitoring Suppl (ACCU-CHEK GUIDE) w/Device KIT Check blood sugar once each morning 1 kit 0   buPROPion (WELLBUTRIN XL) 150 MG 24 hr tablet TAKE ONE TABLET BY MOUTH DAILY 90 tablet 2   carvedilol (COREG) 3.125 MG tablet TAKE ONE TABLET BY MOUTH TWICE A DAY WITH MEALS 180 tablet 3   CIPRODEX OTIC suspension  04/11/2022 [name] [name]   [birth year]  [id number]  Primary Physician Lind Covert, MD Primary Cardiologist: [name]  HPI:  [name] [name] is seen today for follow up CAD. His past medical history is significant for CAD, DM, obesity, tobacco use , hyperlipidemia and hypertension. He  is status post stenting of the right coronary artery in May of 2013. He was admitted with a NSTEMI in February of 2015. He was found to have severe disease in LCx and OM2 treated with BMS.  He was placed on dual antiplatelet therapy with aspirin plus Effient.   He presented with increased angina in October 2015. A Myoview study was intermediate risk.  He had repeat cardiac cath in November 2015.  He was found to have severe single-vessel disease involving the bifurcation of OM1 and OM 2 with 99% in-stent restenosis in the bare-metal stent placed in the OM 2. He underwent successful PCI spanning the proximal circumflex stent across OM 1 through the bare-metal stent placed in OM 2 with a Xience alpine DES stent. He was also noted to have a widely patent stent in the RCA and proximal circumflex with otherwise mild-moderate disease. He has preserved LVEF with an ejection fraction of 55-65%. He was instructed to continue with DAPT with ASA + Effient.   He was admitted in September with acute respiratory failure and hypoxia. Patient intially was hypoxic to 80s and required bipap shortly after admission. Admission labs were notable for BNP of 145 and troponin levels that trended flat (8, 12 and 12). He had a normal CXR and RVP on admission.  Patient had no signs of a pulmonary embolism with lack of tachycardia hemoptysis and a well score 0.  CHF unlikely because patient was not fluid overloaded on exam.  Patient was given IV steroids, duo nebs, was placed on azithromycin. Echocardiogram was completed and showed left ventricular ejection fraction 60 to 65% with normal left ventricular function and mild left ventricular  hypertrophy.  Presence of grade 1 diastolic dysfunction. CTA showed no evidence of pulmonary embolism but did show evidence of consolidation/collapse of lateral right middle lobe. He was treated with antibiotics and steroids with improvement. Felt to be primarily a COPD exacerbation.   On follow up today he reports he is doing well. He denies any chest pain or SOB. He is no longer smoking.   Now living in a town home with his support dog. He reports he is now on Ozempic along with a novolog and lantus pen but states Ozempic is on back order. He reports he lost 25 lbs with this.    Current Outpatient Medications  Medication Sig Dispense Refill   Accu-Chek Softclix Lancets lancets Use as instructed to check three times daily 100 each 12   albuterol (PROAIR HFA) 108 (90 Base) MCG/ACT inhaler INHALE TWO PUFFS BY MOUTH EVERY 6 HOURS AS NEEDED FOR WHEEZING OR FOR SHORTNESS OF BREATH 8.5 g 6   aspirin 81 MG chewable tablet Chew 1 tablet (81 mg total) by mouth daily. 180 tablet 2   atorvastatin (LIPITOR) 80 MG tablet TAKE ONE TABLET BY MOUTH DAILY 90 tablet 1   Blood Glucose Monitoring Suppl (ACCU-CHEK GUIDE) w/Device KIT Check blood sugar once each morning 1 kit 0   buPROPion (WELLBUTRIN XL) 150 MG 24 hr tablet TAKE ONE TABLET BY MOUTH DAILY 90 tablet 2   carvedilol (COREG) 3.125 MG tablet TAKE ONE TABLET BY MOUTH TWICE A DAY WITH MEALS 180 tablet 3   CIPRODEX OTIC suspension  04/11/2022 [name] [name]   Feb 04, 1961  [id number]  Primary Physician Lind Covert, MD Primary Cardiologist: [name]  HPI:  [name] [name] is seen today for follow up CAD. His past medical history is significant for CAD, DM, obesity, tobacco use , hyperlipidemia and hypertension. He  is status post stenting of the right coronary artery in May of 2013. He was admitted with a NSTEMI in February of 2015. He was found to have severe disease in LCx and OM2 treated with BMS.  He was placed on dual antiplatelet therapy with aspirin plus Effient.   He presented with increased angina in October 2015. A Myoview study was intermediate risk.  He had repeat cardiac cath in November 2015.  He was found to have severe single-vessel disease involving the bifurcation of OM1 and OM 2 with 99% in-stent restenosis in the bare-metal stent placed in the OM 2. He underwent successful PCI spanning the proximal circumflex stent across OM 1 through the bare-metal stent placed in OM 2 with a Xience alpine DES stent. He was also noted to have a widely patent stent in the RCA and proximal circumflex with otherwise mild-moderate disease. He has preserved LVEF with an ejection fraction of 55-65%. He was instructed to continue with DAPT with ASA + Effient.   He was admitted in September with acute respiratory failure and hypoxia. Patient intially was hypoxic to 80s and required bipap shortly after admission. Admission labs were notable for BNP of 145 and troponin levels that trended flat (8, 12 and 12). He had a normal CXR and RVP on admission.  Patient had no signs of a pulmonary embolism with lack of tachycardia hemoptysis and a well score 0.  CHF unlikely because patient was not fluid overloaded on exam.  Patient was given IV steroids, duo nebs, was placed on azithromycin. Echocardiogram was completed and showed left ventricular ejection fraction 60 to 65% with normal left ventricular function and mild left ventricular  hypertrophy.  Presence of grade 1 diastolic dysfunction. CTA showed no evidence of pulmonary embolism but did show evidence of consolidation/collapse of lateral right middle lobe. He was treated with antibiotics and steroids with improvement. Felt to be primarily a COPD exacerbation.   On follow up today he reports he is doing well. He denies any chest pain or SOB. He is no longer smoking.   Now living in a town home with his support dog. He reports he is now on Ozempic along with a novolog and lantus pen but states Ozempic is on back order. He reports he lost 25 lbs with this.    Current Outpatient Medications  Medication Sig Dispense Refill   Accu-Chek Softclix Lancets lancets Use as instructed to check three times daily 100 each 12   albuterol (PROAIR HFA) 108 (90 Base) MCG/ACT inhaler INHALE TWO PUFFS BY MOUTH EVERY 6 HOURS AS NEEDED FOR WHEEZING OR FOR SHORTNESS OF BREATH 8.5 g 6   aspirin 81 MG chewable tablet Chew 1 tablet (81 mg total) by mouth daily. 180 tablet 2   atorvastatin (LIPITOR) 80 MG tablet TAKE ONE TABLET BY MOUTH DAILY 90 tablet 1   Blood Glucose Monitoring Suppl (ACCU-CHEK GUIDE) w/Device KIT Check blood sugar once each morning 1 kit 0   buPROPion (WELLBUTRIN XL) 150 MG 24 hr tablet TAKE ONE TABLET BY MOUTH DAILY 90 tablet 2   carvedilol (COREG) 3.125 MG tablet TAKE ONE TABLET BY MOUTH TWICE A DAY WITH MEALS 180 tablet 3   CIPRODEX OTIC suspension

## 2022-04-11 ENCOUNTER — Encounter: Payer: Self-pay | Admitting: Cardiology

## 2022-04-11 ENCOUNTER — Ambulatory Visit (INDEPENDENT_AMBULATORY_CARE_PROVIDER_SITE_OTHER): Payer: Medicaid Other | Admitting: Cardiology

## 2022-04-11 VITALS — BP 122/56 | HR 67 | Ht 67.0 in | Wt 283.4 lb

## 2022-04-11 DIAGNOSIS — E782 Mixed hyperlipidemia: Secondary | ICD-10-CM | POA: Diagnosis not present

## 2022-04-11 DIAGNOSIS — I2510 Atherosclerotic heart disease of native coronary artery without angina pectoris: Secondary | ICD-10-CM

## 2022-04-11 DIAGNOSIS — Z9861 Coronary angioplasty status: Secondary | ICD-10-CM | POA: Diagnosis not present

## 2022-04-11 DIAGNOSIS — E1165 Type 2 diabetes mellitus with hyperglycemia: Secondary | ICD-10-CM

## 2022-04-11 DIAGNOSIS — Z794 Long term (current) use of insulin: Secondary | ICD-10-CM

## 2022-04-12 ENCOUNTER — Telehealth: Payer: Self-pay

## 2022-04-12 NOTE — Telephone Encounter (Signed)
Patient calls nurse line in regards to [place].   Patient reports the medication has been on back order for over 2 weeks now. Patients last dose was ~ 3 weeks ago. Patient is on '2mg'$  weekly.   Patient advised he can call around to other pharmacies near him to see if they might have medication in stock.   Will forward to PCP.

## 2022-04-15 ENCOUNTER — Other Ambulatory Visit: Payer: Self-pay | Admitting: Family Medicine

## 2022-04-15 DIAGNOSIS — E785 Hyperlipidemia, unspecified: Secondary | ICD-10-CM

## 2022-04-15 DIAGNOSIS — I2510 Atherosclerotic heart disease of native coronary artery without angina pectoris: Secondary | ICD-10-CM

## 2022-05-23 ENCOUNTER — Other Ambulatory Visit: Payer: Self-pay | Admitting: Family Medicine

## 2022-05-23 DIAGNOSIS — I2510 Atherosclerotic heart disease of native coronary artery without angina pectoris: Secondary | ICD-10-CM

## 2022-05-23 DIAGNOSIS — I10 Essential (primary) hypertension: Secondary | ICD-10-CM

## 2022-05-24 ENCOUNTER — Encounter: Payer: Self-pay | Admitting: Family Medicine

## 2022-05-24 ENCOUNTER — Ambulatory Visit (INDEPENDENT_AMBULATORY_CARE_PROVIDER_SITE_OTHER): Payer: Medicaid Other | Admitting: Family Medicine

## 2022-05-24 VITALS — BP 131/64 | HR 66 | Wt 281.2 lb

## 2022-05-24 DIAGNOSIS — Z720 Tobacco use: Secondary | ICD-10-CM | POA: Diagnosis not present

## 2022-05-24 DIAGNOSIS — E1165 Type 2 diabetes mellitus with hyperglycemia: Secondary | ICD-10-CM

## 2022-05-24 DIAGNOSIS — E1159 Type 2 diabetes mellitus with other circulatory complications: Secondary | ICD-10-CM

## 2022-05-24 DIAGNOSIS — I10 Essential (primary) hypertension: Secondary | ICD-10-CM | POA: Diagnosis not present

## 2022-05-24 DIAGNOSIS — Z794 Long term (current) use of insulin: Secondary | ICD-10-CM

## 2022-05-24 LAB — POCT GLYCOSYLATED HEMOGLOBIN (HGB A1C): HbA1c, POC (controlled diabetic range): 7.5 % — AB (ref 0.0–7.0)

## 2022-05-24 MED ORDER — EMPAGLIFLOZIN 25 MG PO TABS: 25.0000 mg | ORAL_TABLET | Freq: Every day | ORAL | 3 refills | Status: DC

## 2022-05-24 MED ORDER — BUPROPION HCL ER (XL) 150 MG PO TB24: 150.0000 mg | ORAL_TABLET | Freq: Every day | ORAL | 2 refills | Status: DC

## 2022-05-24 MED ORDER — ALBUTEROL SULFATE HFA 108 (90 BASE) MCG/ACT IN AERS: INHALATION_SPRAY | RESPIRATORY_TRACT | 6 refills | Status: DC

## 2022-05-24 NOTE — Assessment & Plan Note (Signed)
Stable blood sugar and A1c.  Continue all his current medications

## 2022-05-24 NOTE — Assessment & Plan Note (Signed)
BP Readings from Last 3 Encounters:  05/24/22 131/64  04/11/22 (!) 122/56  02/16/22 (!) 128/52   At goal today.  Continue current medications

## 2022-05-24 NOTE — Progress Notes (Signed)
    SUBJECTIVE:   CHIEF COMPLAINT / HPI:    Essential hypertension Brings in all his medications.  No falls or lightheadness    Type 2 diabetes mellitus with hyperglycemia, with long-term current use of insulin (HCC) Wearing his CBG.  His readings are all most all in range.  He sometimes adjusts his lantus downward when he feels he may get low    PERTINENT  PMH / PSH: trying to exercise more  OBJECTIVE:   BP 131/64   Pulse 66   Wt 281 lb 3.2 oz (127.6 kg)   SpO2 98%   BMI 44.04 kg/m   Heart - Regular rate and rhythm.  No murmurs, gallops or rubs.    Lungs:  Normal respiratory effort, chest expands symmetrically. Lungs are clear to auscultation, no crackles or wheezes. Extremities:  No cyanosis, edema, or deformity noted with good range of motion of all major joints.     ASSESSMENT/PLAN:   Essential hypertension BP Readings from Last 3 Encounters:  05/24/22 131/64  04/11/22 (!) 122/56  02/16/22 (!) 128/52   At goal today.  Continue current medications   Type 2 diabetes mellitus with hyperglycemia, with long-term current use of insulin (HCC) Stable blood sugar and A1c.  Continue all his current medications    Patient Instructions  Good to see you today - Thank you for coming in  Things we discussed today:  Diabetes Keep doing what you are  Get your Covid and Flu shot in October  - call to set up  Please always bring your medication bottles  Come back to see me in 3 months    Lind Covert, [place]

## 2022-05-24 NOTE — Patient Instructions (Signed)
Good to see you today - Thank you for coming in  Things we discussed today:  Diabetes Keep doing what you are  Get your Covid and Flu shot in October  - call to set up  Please always bring your medication bottles  Come back to see me in 3 months

## 2022-05-31 ENCOUNTER — Encounter: Payer: Self-pay | Admitting: Podiatry

## 2022-05-31 ENCOUNTER — Ambulatory Visit (INDEPENDENT_AMBULATORY_CARE_PROVIDER_SITE_OTHER): Payer: Medicaid Other | Admitting: Podiatry

## 2022-05-31 DIAGNOSIS — E1142 Type 2 diabetes mellitus with diabetic polyneuropathy: Secondary | ICD-10-CM

## 2022-05-31 DIAGNOSIS — M79674 Pain in right toe(s): Secondary | ICD-10-CM

## 2022-05-31 DIAGNOSIS — M79675 Pain in left toe(s): Secondary | ICD-10-CM | POA: Diagnosis not present

## 2022-05-31 DIAGNOSIS — B351 Tinea unguium: Secondary | ICD-10-CM | POA: Diagnosis not present

## 2022-06-05 NOTE — Progress Notes (Signed)
  Subjective:  Patient ID: [name] [name], male    DOB: 08/31/1961,  MRN: [id number]  [name] [name] presents to clinic today for at risk foot care with history of diabetic neuropathy and painful thick toenails that are difficult to trim. Pain interferes with ambulation. Aggravating factors include wearing enclosed shoe gear. Pain is relieved with periodic professional debridement.  Patient states blood glucose was 99 mg/dl today. Last known  HgA1c was 7.5%.    New problem(s): None.   PCP is Lind Covert, MD , and last visit was  May 24, 2022.  Allergies  Allergen Reactions   Shellfish Allergy Nausea And Vomiting    OYSTERS   Review of Systems: Negative except as noted in the HPI.  Objective: No changes noted in today's physical examination.  [name] [name] is a pleasant 61 y.o. male in NAD. AAO x 3.  Vascular Examination: Vascular status intact b/l with palpable pedal pulses. CFT immediate b/l. No edema. No pain with calf compression b/l. Skin temperature gradient WNL b/l.   Neurological Examination:  Protective sensation diminished with 10g monofilament b/l.  Dermatological Examination: Pedal skin with normal turgor, texture and tone b/l. Toenails 1-5 b/l thick, discolored, elongated with subungual debris and pain on dorsal palpation. No hyperkeratotic lesions noted b/l.   Musculoskeletal Examination: Normal muscle strength 5/5 to all lower extremity muscle groups bilaterally. No pain, crepitus or joint limitation noted with ROM b/l LE. No gross bony pedal deformities b/l. Patient ambulates independently without assistive aids.  Radiographs: None  Assessment/Plan: 1. Pain due to onychomycosis of toenails of both feet   2. Diabetic peripheral neuropathy associated with type 2 diabetes mellitus ([place])   -Consent given for treatment as described below: -Examined patient. -Continue diabetic foot care principles: inspect feet daily, monitor glucose as recommended by PCP  and/or Endocrinologist, and follow prescribed diet per PCP, Endocrinologist and/or dietician. -Toenails 1-5 b/l were debrided in length and girth with sterile nail nippers and dremel without iatrogenic bleeding.  -Patient/POA to call should there be question/concern in the interim.   Return in about 3 months (around 08/30/2022).  Marzetta Board, DPM

## 2022-06-14 ENCOUNTER — Telehealth: Payer: Self-pay

## 2022-06-14 NOTE — Telephone Encounter (Signed)
Attempted to call pt  but no answer. Will try another time. [name] [name] Kennon Holter, CMA

## 2022-06-14 NOTE — Telephone Encounter (Signed)
Patient calls nurse line requesting advice regarding flu vaccine.   He reports he is scheduled for his flu shot this Thursday. However, he reports he is supposed to be giving blood tomorrow.   Patient is unsure if giving blood will have any impact on flu shot.   Patient is ok with waiting to give blood vs get flu vaccine.   Will forward to PCP for advisement.

## 2022-06-14 NOTE — Telephone Encounter (Signed)
Pls let him know if ok toget both on same day  And tell him Hppy Bday  thanks

## 2022-06-16 ENCOUNTER — Ambulatory Visit (INDEPENDENT_AMBULATORY_CARE_PROVIDER_SITE_OTHER): Payer: Medicaid Other

## 2022-06-16 DIAGNOSIS — Z23 Encounter for immunization: Secondary | ICD-10-CM

## 2022-07-12 ENCOUNTER — Other Ambulatory Visit: Payer: Self-pay | Admitting: Family Medicine

## 2022-07-12 DIAGNOSIS — F32A Depression, unspecified: Secondary | ICD-10-CM

## 2022-07-13 ENCOUNTER — Telehealth: Payer: Self-pay | Admitting: Family Medicine

## 2022-07-13 DIAGNOSIS — Z720 Tobacco use: Secondary | ICD-10-CM

## 2022-07-13 DIAGNOSIS — F32A Depression, unspecified: Secondary | ICD-10-CM

## 2022-07-13 MED ORDER — CITALOPRAM HYDROBROMIDE 20 MG PO TABS: 20.0000 mg | ORAL_TABLET | Freq: Every day | ORAL | 1 refills | Status: DC

## 2022-07-13 NOTE — Telephone Encounter (Signed)
Spoke w him regarding possible interactions with wellbutrin and celexa Decided to decrease buproprion to 75 mg daily Continue other medications as are  He agrees Will contact me if any questions

## 2022-08-13 ENCOUNTER — Other Ambulatory Visit: Payer: Self-pay | Admitting: Family Medicine

## 2022-08-17 ENCOUNTER — Other Ambulatory Visit: Payer: Self-pay | Admitting: Family Medicine

## 2022-08-30 ENCOUNTER — Ambulatory Visit (INDEPENDENT_AMBULATORY_CARE_PROVIDER_SITE_OTHER): Payer: Medicaid Other | Admitting: Podiatry

## 2022-08-30 VITALS — BP 131/77

## 2022-08-30 DIAGNOSIS — E1142 Type 2 diabetes mellitus with diabetic polyneuropathy: Secondary | ICD-10-CM

## 2022-08-30 DIAGNOSIS — M79675 Pain in left toe(s): Secondary | ICD-10-CM

## 2022-08-30 DIAGNOSIS — B351 Tinea unguium: Secondary | ICD-10-CM | POA: Diagnosis not present

## 2022-08-30 DIAGNOSIS — E119 Type 2 diabetes mellitus without complications: Secondary | ICD-10-CM

## 2022-08-30 DIAGNOSIS — M79674 Pain in right toe(s): Secondary | ICD-10-CM | POA: Diagnosis not present

## 2022-08-30 DIAGNOSIS — R21 Rash and other nonspecific skin eruption: Secondary | ICD-10-CM

## 2022-08-30 MED ORDER — GENTAMICIN SULFATE 0.1 % EX CREA: TOPICAL_CREAM | CUTANEOUS | 0 refills | Status: DC

## 2022-08-30 NOTE — Patient Instructions (Signed)
Apply Gentamicin Cream to right foot once daily.

## 2022-08-30 NOTE — Progress Notes (Unsigned)
ANNUAL DIABETIC FOOT EXAM  Subjective: [name] [name] presents today {jgcomplaint:23593}.  No chief complaint on file.  Patient confirms h/o diabetes.  Patient relates {Numbers; 0-100:15068} year h/o diabetes.  Patient denies any h/o foot wounds.  Patient has h/o foot ulcer of {jgPodToeLocator:23637}, which healed via help of ***.  Patient has h/o amputation(s): {jgamp:23617}.  Patient admits symptoms of foot numbness.   Patient admits symptoms of foot tingling.  Patient admits symptoms of burning in feet.  Patient admits symptoms of pins/needles sensation in feet.  Patient denies any numbness, tingling, burning, or pins/needle sensation in feet.  Patient has been diagnosed with neuropathy and it is managed with {JGNEUROPATHYMEDS:27053}.  Patient's blood sugar was *** mg/dl {Time; today/yesterday/ 2 days ago:19188}. Last known  HgA1c was ***%   Patient did not check blood glucose this morning.  Patient does not monitor blood glucose daily.  Risk factors: {jgriskfactors:24044}.  Lind Covert, MD is patient's PCP. Last visit was {Time; dates multiple:15870}***.  Past Medical History:  Diagnosis Date   Alcoholism [facility])    Anxiety    Arthritis    "qwhere"   Asthma    CAD S/P percutaneous coronary angioplasty 01/18/12; 10/2013   a. pRCA 3.5 x 18 vision BMS - 4.2 mm; b. 2/'15: mCx 3.5 x 12  Rebel BMS (3.6-3.7 mm)   Chronic back pain    "mid/lower" (08/01/2014)   COPD (chronic obstructive pulmonary disease) ([place])    "I'm seeing COPD dr now; don't know if I've got it" (08/01/2014)   Depression    GERD (gastroesophageal reflux disease)    Hyperlipidemia    Hypertension    Migraines    "once in awhile" (08/01/2014)   Non-ST elevation myocardial infarction (NSTEMI) ([place]) 10/2013   OSA on CPAP 01/19/2012   Sleep apnea    Type II diabetes mellitus ([place])    Patient Active Problem List   Diagnosis Date Noted   Degenerative joint disease 02/16/2022   Mixed conductive  and sensorineural hearing loss of both ears 10/06/2021   Colon polyps 02/18/2020   Antiplatelet or antithrombotic long-term use 12/23/2019   Mild nonproliferative diabetic retinopathy of both eyes without macular edema associated with type 2 diabetes mellitus ([place]) 04/30/2019   Depression, recurrent ([place]) 02/17/2016   Hearing difficulty 12/10/2014   COPD, moderate ([place]) 11/22/2014   Angina, class II ([place]) 08/01/2014   H/O NSTEMI (non-ST elevated myocardial infarction) 11/07/2013   Tobacco abuse 08/24/2012   Obesity 07/31/2012   Type 2 diabetes mellitus with hyperglycemia, with long-term current use of insulin ([place]) 06/05/2012   CAD S/P percutaneous coronary angioplasty 01/21/2012   OSA on CPAP 01/19/2012   Essential hypertension 01/18/2012   Hyperlipidemia with target LDL less than 70 01/18/2012   Past Surgical History:  Procedure Laterality Date   CARDIAC CATHETERIZATION  07/2014   Left Main: Short, large-caliber vessel. Widely patent. Bifurcates into the LAD and Circumflex. Angiographically normal.   CORONARY ANGIOPLASTY WITH STENT PLACEMENT  01/2012; 11/07/2013; 08/01/2014   "1 + 2 + 1"    CORONARY ANGIOPLASTY WITH STENT PLACEMENT  07/2014   Severe single-vessel disease involving the bifurcation of OM1 and OM 2 with 99% in-stent restenosis in the bare-metal stent placed in the OM 2.   LEFT HEART CATHETERIZATION WITH CORONARY ANGIOGRAM N/A 01/20/2012   Procedure: LEFT HEART CATHETERIZATION WITH CORONARY ANGIOGRAM;  Surgeon: Jettie Booze, MD;  Location[facility] CATH LAB;  Service: Cardiovascular;  Laterality: N/A;   LEFT HEART CATHETERIZATION WITH CORONARY ANGIOGRAM N/A 11/07/2013  Procedure: LEFT HEART CATHETERIZATION WITH CORONARY ANGIOGRAM;  Surgeon: Jettie Booze, MD;  Location[facility] CATH LAB;  Service: Cardiovascular;  Laterality: N/A;   LEFT HEART CATHETERIZATION WITH CORONARY ANGIOGRAM N/A 08/01/2014   Procedure: LEFT HEART CATHETERIZATION WITH CORONARY ANGIOGRAM;  Surgeon:  Leonie Man, MD;  Location: [facility] CATH LAB;  Service: Cardiovascular;  Laterality: N/A;   MULTIPLE TOOTH EXTRACTIONS     PERCUTANEOUS CORONARY STENT INTERVENTION (PCI-S)  01/20/2012   Procedure: PERCUTANEOUS CORONARY STENT INTERVENTION (PCI-S);  Surgeon: Jettie Booze, MD;  Location: [facility] CATH LAB;  Service: Cardiovascular;;   PERCUTANEOUS CORONARY STENT INTERVENTION (PCI-S)  11/07/2013   Procedure: PERCUTANEOUS CORONARY STENT INTERVENTION (PCI-S);  Surgeon: Jettie Booze, MD;  Location: [facility] CATH LAB;  Service: Cardiovascular;;   Current Outpatient Medications on File Prior to Visit  Medication Sig Dispense Refill   Accu-Chek Softclix Lancets lancets Use as instructed to check three times daily 100 each 12   albuterol (PROAIR HFA) 108 (90 Base) MCG/ACT inhaler INHALE TWO PUFFS BY MOUTH EVERY 6 HOURS AS NEEDED FOR WHEEZING OR FOR SHORTNESS OF BREATH 8.5 g 6   aspirin 81 MG chewable tablet Chew 1 tablet (81 mg total) by mouth daily. 180 tablet 2   atorvastatin (LIPITOR) 80 MG tablet TAKE ONE TABLET BY MOUTH DAILY 90 tablet 3   Blood Glucose Monitoring Suppl (ACCU-CHEK GUIDE) w/Device KIT Check blood sugar once each morning 1 kit 0   buPROPion (WELLBUTRIN XL) 150 MG 24 hr tablet Take 1/2 tablet daily  2   carvedilol (COREG) 3.125 MG tablet TAKE ONE TABLET BY MOUTH TWICE A DAY WITH MEALS 180 tablet 3   CIPRODEX OTIC suspension      citalopram (CELEXA) 20 MG tablet Take 1 tablet (20 mg total) by mouth daily. 90 tablet 1   Continuous Blood Gluc Receiver (FREESTYLE LIBRE 2 READER) DEVI USE AS DIRECTED 1 each 0   Continuous Blood Gluc Sensor (FREESTYLE LIBRE 2 SENSOR) MISC As directed 6 each 3   empagliflozin (JARDIANCE) 25 MG TABS tablet Take 1 tablet (25 mg total) by mouth daily. 90 tablet 3   glucose blood (ACCU-CHEK GUIDE) test strip Use as instructed 100 each 12   Insulin Pen Needle (DROPLET PEN NEEDLES) 29G X 12MM MISC USE ONCE DAILY WITH VICTOZA PEN 100 each 1   Insulin Pen Needle (PEN  NEEDLES) 32G X 4 MM MISC Use as directed with insulin four times daily 100 each 12   LANTUS SOLOSTAR 100 UNIT/ML Solostar Pen INJECT 50 UNITS UNDER THE SKIN DAILY 15 mL 2   lisinopril (ZESTRIL) 2.5 MG tablet TAKE 1 TABLET BY MOUTH TWICE A DAY 180 tablet 3   Melatonin 2.5 MG CAPS Take 5 mg by mouth at bedtime.     metFORMIN (GLUCOPHAGE) 1000 MG tablet TAKE ONE TABLET BY MOUTH TWICE A DAY WITH A MEAL 180 tablet 3   Miconazole Nitrate 2 % AERO Spray between toes every morning. (Patient taking differently: Apply 1 spray topically daily. Spray between toes every morning.) 133 g 2   mometasone-formoterol (DULERA) 200-5 MCG/ACT AERO Inhale 2 puffs into the lungs in the morning and at bedtime. 39 g 6   Multiple Vitamin (MULTIVITAMIN) tablet Take 1 tablet by mouth daily.     nitroGLYCERIN (NITROSTAT) 0.4 MG SL tablet DISSOLVE 1 TABLET UNDER THE TONGUE FOR CHEST PAIN. IF PAIN REMAINS AFTER 5 MINUTES, CALL 911 AND REPEAT DOSE. MAX 3 TABLETS IN 15 MINUTES 100 tablet 0   Omega-3 Fatty Acids (FISH  OIL) 1000 MG CAPS Take 1 capsule (1,000 mg total) by mouth daily.     prasugrel (EFFIENT) 10 MG TABS tablet Take 1 tablet (10 mg total) by mouth daily. 90 tablet 3   Semaglutide, 2 MG/DOSE, (OZEMPIC, 2 MG/DOSE,) 8 MG/3ML SOPN Inject 2 mg into the skin once a week. 9 mL 3   traMADol (ULTRAM) 50 MG tablet Take 1 tablet (50 mg total) by mouth every 6 (six) hours as needed. (Patient not taking: Reported on 05/24/2022) 20 tablet 1   No current facility-administered medications on file prior to visit.    Allergies  Allergen Reactions   Shellfish Allergy Nausea And Vomiting    OYSTERS   Social History   Occupational History   Occupation: Unemployed    Comment: Used to work for [employer]   Occupation: Ship broker, Human resources officer  Tobacco Use   Smoking status: Former    Packs/day: 1.00    Years: 42.00    Total pack years: 42.00    Types: Cigarettes    Start date: 09/13/1975    Quit date: 01/25/2018     Years since quitting: 4.5   Smokeless tobacco: Former    Types: Chew   Tobacco comments:    Bupropion helping  Vaping Use   Vaping Use: Never used  Substance and Sexual Activity   Alcohol use: Not Currently    Alcohol/week: 6.0 standard drinks of alcohol    Types: 6 Cans of beer per week    Comment: pt reports "I quit 66 days ago" (05/09/16)   Drug use: No    Comment: Crack Cocaine (denies 05/09/16)   Sexual activity: Not Currently   Family History  Problem Relation Age of Onset   Breast cancer Mother    Heart attack Father 75   Hypertension Sister    Hyperlipidemia Sister    Hyperlipidemia Sister    Hypertension Sister    Diabetes Sister    Heart attack Brother    Stroke Brother    Diabetes Brother    Colon cancer Neg Hx    Esophageal cancer Neg Hx    Rectal cancer Neg Hx    Stomach cancer Neg Hx    Immunization History  Administered Date(s) Administered   Influenza Split 08/24/2012   Influenza,inj,Quad PF,6+ Mos 07/31/2013, 07/08/2014, 05/27/2015, 05/23/2016, 06/07/2017, 05/24/2018, 05/24/2019, 05/19/2020, 05/18/2021, 06/16/2022   PFIZER(Purple Top)SARS-COV-2 Vaccination 12/21/2019, 01/15/2020, 09/19/2020   Pneumococcal Polysaccharide-23 01/19/2012, 03/01/2017   Tdap 08/24/2012, 10/21/2019     Review of Systems: Negative except as noted in the HPI.   Objective: There were no vitals filed for this visit.  [name] [name] is a pleasant 61 y.o. male in NAD. AAO X 3.  Vascular Examination: {jgvascular:23595}  Dermatological Examination: {jgderm:23598}  Neurological Examination: {jgneuro:23601::"Protective sensation intact 5/5 intact bilaterally with 10g monofilament b/l.","Vibratory sensation intact b/l.","Proprioception intact bilaterally."}  Musculoskeletal Examination: {jgmsk:23600}  Footwear Assessment: Does the patient wear appropriate shoes? {Yes,No}. Does the patient need inserts/orthotics? {Yes,No}.  ADA Risk Categorization: Low Risk :  Patient has  all of the following: Intact protective sensation No prior foot ulcer  No severe deformity Pedal pulses present  High Risk  Patient has one or more of the following: Loss of protective sensation Absent pedal pulses Severe Foot deformity History of foot ulcer  Assessment: No diagnosis found.   Plan: No orders of the defined types were placed in this encounter.   No orders of the defined types were placed in this encounter.   None  {  jgplan:23602::"-Patient/POA to call should there be question/concern in the interim."} Return in about 3 months (around 11/29/2022).  Marzetta Board, DPM

## 2022-09-01 ENCOUNTER — Encounter: Payer: Self-pay | Admitting: Podiatry

## 2022-09-07 ENCOUNTER — Ambulatory Visit (INDEPENDENT_AMBULATORY_CARE_PROVIDER_SITE_OTHER): Payer: Medicaid Other | Admitting: Family Medicine

## 2022-09-07 ENCOUNTER — Encounter: Payer: Self-pay | Admitting: Family Medicine

## 2022-09-07 ENCOUNTER — Other Ambulatory Visit: Payer: Self-pay

## 2022-09-07 VITALS — BP 129/60 | HR 78 | Wt 273.4 lb

## 2022-09-07 DIAGNOSIS — F339 Major depressive disorder, recurrent, unspecified: Secondary | ICD-10-CM | POA: Diagnosis not present

## 2022-09-07 DIAGNOSIS — R748 Abnormal levels of other serum enzymes: Secondary | ICD-10-CM | POA: Diagnosis present

## 2022-09-07 DIAGNOSIS — Z122 Encounter for screening for malignant neoplasm of respiratory organs: Secondary | ICD-10-CM | POA: Diagnosis not present

## 2022-09-07 DIAGNOSIS — E1110 Type 2 diabetes mellitus with ketoacidosis without coma: Secondary | ICD-10-CM

## 2022-09-07 DIAGNOSIS — Z720 Tobacco use: Secondary | ICD-10-CM

## 2022-09-07 DIAGNOSIS — E1165 Type 2 diabetes mellitus with hyperglycemia: Secondary | ICD-10-CM

## 2022-09-07 DIAGNOSIS — Z794 Long term (current) use of insulin: Secondary | ICD-10-CM | POA: Diagnosis not present

## 2022-09-07 DIAGNOSIS — I10 Essential (primary) hypertension: Secondary | ICD-10-CM | POA: Diagnosis not present

## 2022-09-07 LAB — POCT GLYCOSYLATED HEMOGLOBIN (HGB A1C): HbA1c, POC (controlled diabetic range): 8.3 % — AB (ref 0.0–7.0)

## 2022-09-07 MED ORDER — OZEMPIC (2 MG/DOSE) 8 MG/3ML ~~LOC~~ SOPN: 2.0000 mg | PEN_INJECTOR | SUBCUTANEOUS | 3 refills | Status: DC

## 2022-09-07 NOTE — Assessment & Plan Note (Signed)
Good control continue current medications  

## 2022-09-07 NOTE — Progress Notes (Signed)
    SUBJECTIVE:   CHIEF COMPLAINT / HPI:   Essential hypertension Brings in all his medications.  No falls or lightheadness    Type 2 diabetes mellitus with hyperglycemia, with long-term current use of insulin (HCC) Sometimes has low blood sugar and does not take lantus.  Estimates takes about 3 times a week.  Has all his medications with him  Depression Feels well No craving for tobacco.  Wishes to try off wellbutrin OBJECTIVE:   BP 129/60   Pulse 78   Wt 273 lb 6.4 oz (124 kg)   SpO2 95%   BMI 42.82 kg/m   Alert cooperative  Has all his medications   ASSESSMENT/PLAN:   Alkaline phosphatase elevation  Type 2 diabetes mellitus with hyperglycemia, with long-term current use of insulin (HCC) Assessment & Plan: Worsened control.  Likely from skipping lantus.  Will decrease dose from 45 to 40 units and asked him to take every day.  Let us know if getting low  Orders: -     POCT glycosylated hemoglobin (Hb A1C)  Uncontrolled type 2 diabetes mellitus with ketoacidosis without coma, with long-term current use of insulin (HCC) -     Ozempic (2 MG/DOSE); Inject 2 mg into the skin once a week.  Dispense: 18 mL; Refill: 3  Screening for lung cancer -     CT CHEST LUNG CANCER SCREENING LOW DOSE WO CONTRAST; Future  Tobacco abuse Assessment & Plan: Started at age 40 and smoked 1ppd therefore 40 pack years    Essential hypertension Assessment & Plan: Good control continue current medications    Depression, recurrent (HCC) Assessment & Plan: Stable on celexa.  Wean off wellbutrin       Patient Instructions  Good to see you today - Thank you for coming in  Things we discussed today:  Take Lantus 40 units every day - let me know if your blood sugar are running low and decrease to 35 units every day   Take the buproprion every other day until finished then stop  Will order a Lung CT    Please always bring your medication bottles  Come back to see me in 3  months    Lind Covert, [place]

## 2022-09-07 NOTE — Assessment & Plan Note (Signed)
Started at age 61 and smoked 1ppd therefore 40 pack years

## 2022-09-07 NOTE — Patient Instructions (Signed)
Good to see you today - Thank you for coming in  Things we discussed today:  Take Lantus 40 units every day - let me know if your blood sugar are running low and decrease to 35 units every day   Take the buproprion every other day until finished then stop  Will order a Lung CT    Please always bring your medication bottles  Come back to see me in 3 months

## 2022-09-07 NOTE — Assessment & Plan Note (Signed)
Stable on celexa.  Wean off wellbutrin

## 2022-09-07 NOTE — Assessment & Plan Note (Signed)
Worsened control.  Likely from skipping lantus.  Will decrease dose from 45 to 40 units and asked him to take every day.  Let us know if getting low

## 2022-09-19 ENCOUNTER — Ambulatory Visit (HOSPITAL_COMMUNITY)
Admission: RE | Admit: 2022-09-19 | Discharge: 2022-09-19 | Disposition: A | Discharge status: Home / Self Care | Payer: Medicaid Other | Source: Ambulatory Visit | Attending: Family Medicine | Admitting: Family Medicine

## 2022-09-19 DIAGNOSIS — Z122 Encounter for screening for malignant neoplasm of respiratory organs: Secondary | ICD-10-CM | POA: Diagnosis not present

## 2022-09-20 ENCOUNTER — Ambulatory Visit: Payer: Medicaid Other | Admitting: Podiatry

## 2022-09-29 ENCOUNTER — Ambulatory Visit (INDEPENDENT_AMBULATORY_CARE_PROVIDER_SITE_OTHER): Payer: Medicaid Other | Admitting: Podiatry

## 2022-09-29 DIAGNOSIS — B353 Tinea pedis: Secondary | ICD-10-CM | POA: Diagnosis not present

## 2022-09-29 NOTE — Progress Notes (Signed)
  Subjective:  Patient ID: [name] [name], male    DOB: 06-Sep-1961,  MRN: [id number]  Chief Complaint  Patient presents with   Foot Ulcer    right foot 4th webspace check - Dr. Elisha Ponder referral    62 y.o. male presents with the above complaint. History confirmed with patient.  He states it is doing much better it is not having any drainage it is scabbed over and peeled off he is been using gentamicin ointment  Objective:  Physical Exam: warm, good capillary refill, no trophic changes or ulcerative lesions, normal DP and PT pulses, normal sensory exam, and healed webspace lesion right fourth.  Assessment:   1. Tinea pedis of right foot      Plan:  Patient was evaluated and treated and all questions answered.  Doing much better and has resolved.  He may continue to use the ointment until it fully has completed and will return to see me as needed.  We discussed foot hygiene and proper drying and cleansing of the interspace to prevent recurrence of tinea pedis infection  Return if symptoms worsen or fail to improve.

## 2022-10-11 NOTE — Progress Notes (Signed)
10/17/2022 [name] [name]   1961/06/09  [id number]  Primary Physician [name] Covert, MD Primary Cardiologist: [name]  HPI:  [name] [name] is seen today for follow up CAD. His past medical history is significant for CAD, DM, obesity, tobacco use , hyperlipidemia and hypertension. He  is status post stenting of the right coronary artery in May of 2013. He was admitted with a NSTEMI in February of 2015. He was found to have severe disease in LCx and OM2 treated with BMS.  He was placed on dual antiplatelet therapy with aspirin plus Effient.   He presented with increased angina in October 2015. A Myoview study was intermediate risk.  He had repeat cardiac cath in November 2015.  He was found to have severe single-vessel disease involving the bifurcation of OM1 and OM 2 with 99% in-stent restenosis in the bare-metal stent placed in the OM 2. He underwent successful PCI spanning the proximal circumflex stent across OM 1 through the bare-metal stent placed in OM 2 with a Xience alpine DES stent. He was also noted to have a widely patent stent in the RCA and proximal circumflex with otherwise mild-moderate disease. He has preserved LVEF with an ejection fraction of 55-65%. He was instructed to continue with DAPT with ASA + Effient.   He was admitted in September with acute respiratory failure and hypoxia. Patient intially was hypoxic to 80s and required bipap shortly after admission. Admission labs were notable for BNP of 145 and troponin levels that trended flat (8, 12 and 12). He had a normal CXR and RVP on admission.  Patient had no signs of a pulmonary embolism with lack of tachycardia hemoptysis and a well score 0.  CHF unlikely because patient was not fluid overloaded on exam.  Patient was given IV steroids, duo nebs, was placed on azithromycin. Echocardiogram was completed and showed left ventricular ejection fraction 60 to 65% with normal left ventricular function and mild left ventricular  hypertrophy.  Presence of grade 1 diastolic dysfunction. CTA showed no evidence of pulmonary embolism but did show evidence of consolidation/collapse of lateral right middle lobe. He was treated with antibiotics and steroids with improvement. Felt to be primarily a COPD exacerbation.   On follow up today he reports he is doing well. He denies any chest pain or SOB. He is no longer smoking.   Now living in a town home with his support dog. His weight has been stable. Needs refills on meds.    Current Outpatient Medications  Medication Sig Dispense Refill   Accu-Chek Softclix Lancets lancets Use as instructed to check three times daily 100 each 12   albuterol (PROAIR HFA) 108 (90 Base) MCG/ACT inhaler INHALE TWO PUFFS BY MOUTH EVERY 6 HOURS AS NEEDED FOR WHEEZING OR FOR SHORTNESS OF BREATH 8.5 g 6   aspirin 81 MG chewable tablet Chew 1 tablet (81 mg total) by mouth daily. 180 tablet 2   atorvastatin (LIPITOR) 80 MG tablet TAKE ONE TABLET BY MOUTH DAILY 90 tablet 3   Blood Glucose Monitoring Suppl (ACCU-CHEK GUIDE) w/Device KIT Check blood sugar once each morning 1 kit 0   carvedilol (COREG) 3.125 MG tablet TAKE ONE TABLET BY MOUTH TWICE A DAY WITH MEALS 180 tablet 3   CIPRODEX OTIC suspension      citalopram (CELEXA) 20 MG tablet Take 1 tablet (20 mg total) by mouth daily. 90 tablet 1   Continuous Blood Gluc Receiver (FREESTYLE LIBRE 2 READER) DEVI USE AS DIRECTED 1 each 0  Continuous Blood Gluc Sensor (FREESTYLE LIBRE 2 SENSOR) MISC As directed 6 each 3   empagliflozin (JARDIANCE) 25 MG TABS tablet Take 1 tablet (25 mg total) by mouth daily. 90 tablet 3   gentamicin cream (GARAMYCIN) 0.1 % Apply to right foot once daily. 30 g 0   glucose blood (ACCU-CHEK GUIDE) test strip Use as instructed 100 each 12   Insulin Pen Needle (DROPLET PEN NEEDLES) 29G X 12MM MISC USE ONCE DAILY WITH VICTOZA PEN 100 each 1   Insulin Pen Needle (PEN NEEDLES) 32G X 4 MM MISC Use as directed with insulin four times  daily 100 each 12   LANTUS SOLOSTAR 100 UNIT/ML Solostar Pen 40 Units daily. 15 mL 2   lisinopril (ZESTRIL) 2.5 MG tablet TAKE 1 TABLET BY MOUTH TWICE A DAY 180 tablet 3   Melatonin 2.5 MG CAPS Take 5 mg by mouth at bedtime.     metFORMIN (GLUCOPHAGE) 1000 MG tablet TAKE ONE TABLET BY MOUTH TWICE A DAY WITH A MEAL 180 tablet 3   Miconazole Nitrate 2 % AERO Spray between toes every morning. (Patient taking differently: Apply 1 spray topically daily. Spray between toes every morning.) 133 g 2   mometasone-formoterol (DULERA) 200-5 MCG/ACT AERO Inhale 2 puffs into the lungs in the morning and at bedtime. 39 g 6   Multiple Vitamin (MULTIVITAMIN) tablet Take 1 tablet by mouth daily.     nitroGLYCERIN (NITROSTAT) 0.4 MG SL tablet DISSOLVE 1 TABLET UNDER THE TONGUE FOR CHEST PAIN. IF PAIN REMAINS AFTER 5 MINUTES, CALL 911 AND REPEAT DOSE. MAX 3 TABLETS IN 15 MINUTES 100 tablet 0   Omega-3 Fatty Acids (FISH OIL) 1000 MG CAPS Take 1 capsule (1,000 mg total) by mouth daily.     prasugrel (EFFIENT) 10 MG TABS tablet Take 1 tablet (10 mg total) by mouth daily. 90 tablet 3   Semaglutide, 2 MG/DOSE, (OZEMPIC, 2 MG/DOSE,) 8 MG/3ML SOPN Inject 2 mg into the skin once a week. 18 mL 3   traMADol (ULTRAM) 50 MG tablet Take 1 tablet (50 mg total) by mouth every 6 (six) hours as needed. 20 tablet 1   No current facility-administered medications for this visit.    Allergies  Allergen Reactions   Shellfish Allergy Nausea And Vomiting    OYSTERS    Social History   Socioeconomic History   Marital status: Single    Spouse name: Not on file   Number of children: 0   Years of education: 15   Highest education level: Not on file  Occupational History   Occupation: Unemployed    Comment: Used to work for [employer]   Occupation: Ship broker, Hotel manager [employer]  Tobacco Use   Smoking status: Former    Packs/day: 1.00    Years: 42.00    Total pack years: 42.00    Types: Cigarettes    Start date:  09/13/1975    Quit date: 01/25/2018    Years since quitting: 4.7   Smokeless tobacco: Former    Types: Chew   Tobacco comments:    Bupropion helping  Vaping Use   Vaping Use: Never used  Substance and Sexual Activity   Alcohol use: Not Currently    Alcohol/week: 6.0 standard drinks of alcohol    Types: 6 Cans of beer per week    Comment: pt reports "I quit 66 days ago" (05/09/16)   Drug use: No    Comment: Crack Cocaine (denies 05/09/16)   Sexual activity: Not Currently  Other Topics Concern   Not on file  Social History Narrative   Single.  Lives with a roommate.  Ambulates independently.   Social Determinants of Health   Financial Resource Strain: Not on file  Food Insecurity: Not on file  Transportation Needs: Not on file  Physical Activity: Not on file  Stress: Not on file  Social Connections: Not on file  Intimate Partner Violence: Not on file     Review of Systems: AS noted in HPI All other systems reviewed and are otherwise negative except as noted above.  Blood pressure 130/70, pulse 76, height '5\' 7"'$  (1.702 m), weight 273 lb (123.8 kg), SpO2 90 %.  GENERAL:  Well appearing obese WM in NAD HEENT:  PERRL, EOMI, sclera are clear. Oropharynx is clear. NECK:  No jugular venous distention, carotid upstroke brisk and symmetric, no bruits, no thyromegaly or adenopathy LUNGS:  Clear to auscultation bilaterally CHEST:  Unremarkable HEART:  RRR,  PMI not displaced or sustained,S1 and S2 within normal limits, no S3, no S4: no clicks, no rubs, no murmurs ABD:  Soft, nontender. BS +, no masses or bruits. No hepatomegaly, no splenomegaly EXT:  2 + pulses throughout, no edema, no cyanosis no clubbing SKIN:  Warm and dry.  No rashes NEURO:  Alert and oriented x 3. Cranial nerves II through XII intact. PSYCH:  Cognitively intact      Laboratory data:  Lab Results  Component Value Date   WBC 15.4 (H) 05/13/2021   HGB 15.1 05/13/2021   HCT 45.4 05/13/2021   PLT 198  05/13/2021   GLUCOSE 113 (H) 10/05/2021   CHOL 91 (L) 10/05/2021   TRIG 58 10/05/2021   HDL 37 (L) 10/05/2021   LDLDIRECT 70 09/02/2020   LDLCALC 40 10/05/2021   ALT 19 10/05/2021   AST 18 10/05/2021   NA 143 10/05/2021   K 4.6 10/05/2021   CL 105 10/05/2021   CREATININE 0.74 (L) 10/05/2021   BUN 12 10/05/2021   CO2 24 10/05/2021   TSH 1.100 01/18/2012   INR 0.96 07/28/2014   HGBA1C 8.3 (A) 09/07/2022   MICROALBUR 4.7 05/23/2016   Ecg today shows NSR rate 76. LAD, Normal. I have personally reviewed and interpreted this study.   Echo 05/10/21: IMPRESSIONS     1. Left ventricular ejection fraction, by estimation, is 60 to 65%. The  left ventricle has normal function. The left ventricle has no regional  wall motion abnormalities. There is mild left ventricular hypertrophy.  Left ventricular diastolic parameters  are consistent with Grade I diastolic dysfunction (impaired relaxation).   2. Right ventricular systolic function is normal. The right ventricular  size is normal. Tricuspid regurgitation signal is inadequate for assessing  PA pressure.   3. The mitral valve is normal in structure. No evidence of mitral valve  regurgitation. No evidence of mitral stenosis.   4. The aortic valve is tricuspid. Aortic valve regurgitation is not  visualized. Mild aortic valve sclerosis is present, with no evidence of  aortic valve stenosis.   5. The inferior vena cava is normal in size with <50% respiratory  variability, suggesting right atrial pressure of 8 mmHg.    ASSESSMENT AND PLAN:   1. CAD: Status post repeat PCI to the proximal circumflex and OM 2 in Nov. 2015 as outlined above with DES for instent restenosis. He is asymptomatic.  Continue dual antiplatelet therapy with aspirin plus Effient indefinitely due to extensive nature of stents.  Continue beta blocker, statin and ACE  inhibitor for secondary prevention.  2. Hypertension: Blood pressure is well-controlled. Continue  lisinopril and Coreg  3. Hyperlipidemia: on statin. Needs updated labs - will order.   4. Diabetes: per primary care. Encourage weight loss and continued physical exercise.   5. Tobacco abuse: patient has quit. Congratulated.   I will follow up in 6 months   Dixie Jafri Martinique MD,FACC  10/17/2022 4:02 PM

## 2022-10-17 ENCOUNTER — Ambulatory Visit: Payer: Medicaid Other | Attending: Cardiology | Admitting: Cardiology

## 2022-10-17 ENCOUNTER — Encounter: Payer: Self-pay | Admitting: Cardiology

## 2022-10-17 VITALS — BP 130/70 | HR 76 | Ht 67.0 in | Wt 273.0 lb

## 2022-10-17 DIAGNOSIS — E1165 Type 2 diabetes mellitus with hyperglycemia: Secondary | ICD-10-CM

## 2022-10-17 DIAGNOSIS — E782 Mixed hyperlipidemia: Secondary | ICD-10-CM | POA: Diagnosis not present

## 2022-10-17 DIAGNOSIS — I2510 Atherosclerotic heart disease of native coronary artery without angina pectoris: Secondary | ICD-10-CM | POA: Diagnosis not present

## 2022-10-17 DIAGNOSIS — Z9861 Coronary angioplasty status: Secondary | ICD-10-CM

## 2022-10-17 DIAGNOSIS — I10 Essential (primary) hypertension: Secondary | ICD-10-CM

## 2022-10-17 DIAGNOSIS — Z794 Long term (current) use of insulin: Secondary | ICD-10-CM

## 2022-10-17 MED ORDER — PRASUGREL HCL 10 MG PO TABS: 10.0000 mg | ORAL_TABLET | Freq: Every day | ORAL | 3 refills | Status: DC

## 2022-10-17 NOTE — Patient Instructions (Signed)
Medication Instructions:   Your physician recommends that you continue on your current medications as directed. Please refer to the Current Medication list given to you today.  *If you need a refill on your cardiac medications before your next appointment, please call your pharmacy*  Lab Work: Your physician recommends that you return for lab work at your earliest convenience:  CMP Fasting Lipid Panel-DO NOT eat or drink past midnight. Okay to have water and/or black coffee only the morning of  If you have labs (blood work) drawn today and your tests are completely normal, you will receive your results only by: Diar Berkel (if you have MyChart) OR A paper copy in the mail If you have any lab test that is abnormal or we need to change your treatment, we will call you to review the results.  Testing/Procedures: NONE ordered at this time of appointment   Follow-Up: At [facility], you and your health needs are our priority.  As part of our continuing mission to provide you with exceptional heart care, we have created designated Provider Care Teams.  These Care Teams include your primary Cardiologist (physician) and Advanced Practice Providers (APPs -  Physician Assistants and Nurse Practitioners) who all work together to provide you with the care you need, when you need it.  We recommend signing up for the patient portal called "MyChart".  Sign up information is provided on this After Visit Summary.  MyChart is used to connect with patients for Virtual Visits (Telemedicine).  Patients are able to view lab/test results, encounter notes, upcoming appointments, etc.  Non-urgent messages can be sent to your provider as well.   To learn more about what you can do with MyChart, go to [email or web address].    Your next appointment:   6 month(s)  Provider:   Peter Martinique, MD     Other Instructions

## 2022-10-17 NOTE — Addendum Note (Signed)
Addended by: Jacqulynn Cadet on: 10/17/2022 04:10 PM   Modules accepted: Orders

## 2022-10-25 ENCOUNTER — Telehealth: Payer: Self-pay | Admitting: *Deleted

## 2022-10-25 LAB — COMPREHENSIVE METABOLIC PANEL
ALT: 15 IU/L (ref 0–44)
AST: 15 IU/L (ref 0–40)
Albumin/Globulin Ratio: 1.8 (ref 1.2–2.2)
Albumin: 4.6 g/dL (ref 3.9–4.9)
Alkaline Phosphatase: 114 IU/L (ref 44–121)
BUN/Creatinine Ratio: 13 (ref 10–24)
BUN: 10 mg/dL (ref 8–27)
Bilirubin Total: 0.4 mg/dL (ref 0.0–1.2)
CO2: 25 mmol/L (ref 20–29)
Calcium: 9.2 mg/dL (ref 8.6–10.2)
Chloride: 102 mmol/L (ref 96–106)
Creatinine, Ser: 0.77 mg/dL (ref 0.76–1.27)
Globulin, Total: 2.6 g/dL (ref 1.5–4.5)
Glucose: 146 mg/dL — ABNORMAL HIGH (ref 70–99)
Potassium: 4.6 mmol/L (ref 3.5–5.2)
Sodium: 141 mmol/L (ref 134–144)
Total Protein: 7.2 g/dL (ref 6.0–8.5)
eGFR: 102 mL/min/{1.73_m2} (ref >59)

## 2022-10-25 LAB — LIPID PANEL
Chol/HDL Ratio: 2.2 ratio (ref 0.0–5.0)
Cholesterol, Total: 89 mg/dL — ABNORMAL LOW (ref 100–199)
HDL: 41 mg/dL (ref >39)
LDL Chol Calc (NIH): 32 mg/dL (ref 0–99)
Triglycerides: 76 mg/dL (ref 0–149)
VLDL Cholesterol Cal: 16 mg/dL (ref 5–40)

## 2022-10-25 NOTE — Telephone Encounter (Signed)
Pt calls to report that his sugars have dropped in the 60's about 5-6 times int he past week.  He denies lightheadedness or diaphoresis.  He reports that PCP told him to call if this happens.  To PCP.  PCP has openings tomorrow, but pt did not want to come in unless PCP said that he needs to. Christen Bame, CMA

## 2022-10-26 ENCOUNTER — Telehealth: Payer: Self-pay | Admitting: Cardiology

## 2022-10-26 NOTE — Telephone Encounter (Signed)
Called No answer left vm to call back.  If he does would tell him:  He should decrease his insulin to 30 units daily

## 2022-10-26 NOTE — Telephone Encounter (Signed)
Pt returning nurses call regarding lab results. Please advise

## 2022-10-26 NOTE — Telephone Encounter (Signed)
Raiford Simmonds, RN 10/26/2022  2:26 PM EST Back to Top    Left detail message of lab results on patient voicemail per DPR. Any question may call back.   Peter M Martinique, MD 10/26/2022  8:09 AM EST     All values are normal or within acceptable limits.   Medication changes / Follow up labs / Other changes or recommendations:   Looks great   Peter Martinique, MD 10/26/2022 8:09 AM    Patient called w/results

## 2022-10-27 NOTE — Telephone Encounter (Signed)
Error. [name] [name], CMA

## 2022-10-31 NOTE — Telephone Encounter (Signed)
Called again Told him to decrease to 30 units of insulin He agrees

## 2022-11-21 ENCOUNTER — Telehealth: Payer: Self-pay

## 2022-11-21 NOTE — Patient Instructions (Signed)
Good to see you today - Thank you for coming in  Things we discussed today:  You need an diabetes eye exam every year.  Please see your eye doctor.  Ask them to fax [place] a report of your exam   Take the lantus every day.  Call me if you have low blood sugar   Please always bring your medication bottles  Come back to see me in 3 months

## 2022-11-21 NOTE — Progress Notes (Unsigned)
    SUBJECTIVE:   CHIEF COMPLAINT / HPI:   Dm Taking Lantus 30 units every day after decreasing due to low blood sugar.  No further low blood sugar but every so often skips a day if he feels it might be low   Mood Taking the buproprion every other day with 4 doses left.  No change in mood.   OBJECTIVE:   There were no vitals taken for this visit.  Good spirits Has all his medication bottles   ASSESSMENT/PLAN:   Type 2 diabetes mellitus with hyperglycemia, with long-term current use of insulin (HCC) Assessment & Plan: Stable A1c.  Encouraged to take all his medication every day.  Call if low blood sugar   Orders: -     Microalbumin / creatinine urine ratio -     POCT glycosylated hemoglobin (Hb A1C)  Uncontrolled type 2 diabetes mellitus with ketoacidosis without coma, with long-term current use of insulin (HCC) -     Ozempic (2 MG/DOSE); Inject 2 mg into the skin once a week.  Dispense: 18 mL; Refill: 3  Essential hypertension Assessment & Plan: Good control    Depression, recurrent (HCC) Assessment & Plan: Stable weaning off buproprion.  Continue to monitor      Patient Instructions  Good to see you today - Thank you for coming in  Things we discussed today:  You need an diabetes eye exam every year.  Please see your eye doctor.  Ask them to fax [place] a report of your exam   Take the lantus every day.  Call me if you have low blood sugar   Please always bring your medication bottles  Come back to see me in 3 months    Lind Covert, [place]

## 2022-11-21 NOTE — Telephone Encounter (Signed)
A Prior Authorization was initiated for this patients OZEMPIC '2MG'$  DOSE PENS through CoverMyMeds.   Key: [id number]

## 2022-11-22 ENCOUNTER — Ambulatory Visit (INDEPENDENT_AMBULATORY_CARE_PROVIDER_SITE_OTHER): Payer: Medicaid Other | Admitting: Family Medicine

## 2022-11-22 VITALS — BP 130/80 | HR 63 | Wt 274.2 lb

## 2022-11-22 DIAGNOSIS — Z794 Long term (current) use of insulin: Secondary | ICD-10-CM | POA: Diagnosis not present

## 2022-11-22 DIAGNOSIS — E1165 Type 2 diabetes mellitus with hyperglycemia: Secondary | ICD-10-CM | POA: Diagnosis present

## 2022-11-22 DIAGNOSIS — F339 Major depressive disorder, recurrent, unspecified: Secondary | ICD-10-CM

## 2022-11-22 DIAGNOSIS — E1110 Type 2 diabetes mellitus with ketoacidosis without coma: Secondary | ICD-10-CM | POA: Diagnosis not present

## 2022-11-22 DIAGNOSIS — I10 Essential (primary) hypertension: Secondary | ICD-10-CM | POA: Diagnosis not present

## 2022-11-22 LAB — POCT GLYCOSYLATED HEMOGLOBIN (HGB A1C): HbA1c, POC (controlled diabetic range): 7.9 % — AB (ref 0.0–7.0)

## 2022-11-22 MED ORDER — OZEMPIC (2 MG/DOSE) 8 MG/3ML ~~LOC~~ SOPN: 2.0000 mg | PEN_INJECTOR | SUBCUTANEOUS | 3 refills | Status: DC

## 2022-11-22 NOTE — Assessment & Plan Note (Signed)
Stable weaning off buproprion.  Continue to monitor

## 2022-11-22 NOTE — Assessment & Plan Note (Signed)
Stable A1c.  Encouraged to take all his medication every day.  Call if low blood sugar

## 2022-11-22 NOTE — Assessment & Plan Note (Signed)
Good control

## 2022-11-23 LAB — MICROALBUMIN / CREATININE URINE RATIO
Creatinine, Urine: 47.7 mg/dL
Microalb/Creat Ratio: 264 mg/g creat — ABNORMAL HIGH (ref 0–29)
Microalbumin, Urine: 126.1 ug/mL

## 2022-11-23 NOTE — Telephone Encounter (Signed)
Prior Auth for patients medication OZEMPIC '2MG'$  DOSE PENS approved by [employer] from 11/21/22 to 11/21/23.  CoverMyMeds Key: [id number]

## 2022-11-25 ENCOUNTER — Telehealth: Payer: Self-pay

## 2022-11-25 NOTE — Telephone Encounter (Signed)
A Prior Authorization was initiated for this patients JARDIANCE through CoverMyMeds.   Key: [id number]

## 2022-11-28 ENCOUNTER — Ambulatory Visit (INDEPENDENT_AMBULATORY_CARE_PROVIDER_SITE_OTHER): Payer: Medicaid Other | Admitting: Podiatry

## 2022-11-28 DIAGNOSIS — B351 Tinea unguium: Secondary | ICD-10-CM

## 2022-11-28 DIAGNOSIS — M79674 Pain in right toe(s): Secondary | ICD-10-CM

## 2022-11-28 DIAGNOSIS — R234 Changes in skin texture: Secondary | ICD-10-CM

## 2022-11-28 DIAGNOSIS — M79675 Pain in left toe(s): Secondary | ICD-10-CM | POA: Diagnosis not present

## 2022-11-28 DIAGNOSIS — E1142 Type 2 diabetes mellitus with diabetic polyneuropathy: Secondary | ICD-10-CM

## 2022-11-28 NOTE — Progress Notes (Signed)
  Subjective:  Patient ID: [name] [name], male    DOB: 22-Sep-1960,  MRN: [id number]  [name] [name] presents to clinic today for {jgcomplaint:23593}  Chief Complaint  Patient presents with   Nail Problem    [facility] BS-132 A1C-7.9 PCP-[facility] PCP VST-11/23/2022   New problem(s): None.   PCP is Lind Covert, MD.  Allergies  Allergen Reactions   Shellfish Allergy Nausea And Vomiting    OYSTERS    Review of Systems: Negative except as noted in the HPI.  Objective: No changes noted in today's physical examination. There were no vitals filed for this visit. [name] [name] is a pleasant 62 y.o. male {jgbodyhabitus:24098} AAO x 3.  Vascular Examination: Capillary refill time immediate b/l. Vascular status intact b/l with palpable pedal pulses. Pedal hair present b/l. No edema. No pain with calf compression b/l. Skin temperature gradient WNL b/l. No ischemia or gangrene noted b/l LE. No cyanosis or clubbing noted b/l LE.  Neurological Examination: Sensation grossly intact b/l with 10 gram monofilament. Vibratory sensation intact b/l.   Dermatological Examination: Pedal skin with normal turgor, texture and tone b/l. Toenails 1-5 b/l thick, discolored, elongated with subungual debris and pain on dorsal palpation.   Dry skin right 4th webspace resolved. No penetration into deep tissues. No purulence, no odor. No interdigital macerations noted b/l LE.  Skin crack left 4th dorsal webspace.   Musculoskeletal Examination: Normal muscle strength 5/5 to all lower extremity muscle groups bilaterally. No pain, crepitus or joint limitation noted with ROM b/l LE. No gross bony pedal deformities b/l. Patient ambulates independently without assistive aids.  Radiographs: None Assessment/Plan: 1. Pain due to onychomycosis of toenails of both feet   2. Cracked skin on feet   3. Diabetic peripheral neuropathy associated with type 2 diabetes mellitus (HCC)    {Jgplan:23602::"-Patient/POA to call  should there be question/concern in the interim."}   Return in about 3 months (around 02/28/2023).  Marzetta Board, DPM

## 2022-11-30 ENCOUNTER — Encounter: Payer: Self-pay | Admitting: Podiatry

## 2022-11-30 NOTE — Telephone Encounter (Signed)
Prior Auth for patients medication JARDIANCE approved by [employer] from 11/25/22 to 11/25/23.  CoverMyMeds Key: [id number] PA Case ID #: [id number]

## 2022-12-06 ENCOUNTER — Other Ambulatory Visit: Payer: Self-pay | Admitting: Family Medicine

## 2022-12-22 ENCOUNTER — Other Ambulatory Visit: Payer: Self-pay | Admitting: Family Medicine

## 2023-01-13 ENCOUNTER — Other Ambulatory Visit: Payer: Self-pay | Admitting: Family Medicine

## 2023-01-18 ENCOUNTER — Other Ambulatory Visit: Payer: Self-pay | Admitting: Family Medicine

## 2023-01-18 DIAGNOSIS — F32A Depression, unspecified: Secondary | ICD-10-CM

## 2023-01-25 ENCOUNTER — Other Ambulatory Visit: Payer: Self-pay | Admitting: Family Medicine

## 2023-01-25 DIAGNOSIS — Z794 Long term (current) use of insulin: Secondary | ICD-10-CM

## 2023-01-26 ENCOUNTER — Other Ambulatory Visit: Payer: Self-pay | Admitting: Family Medicine

## 2023-02-14 ENCOUNTER — Other Ambulatory Visit: Payer: Self-pay

## 2023-02-14 ENCOUNTER — Ambulatory Visit (INDEPENDENT_AMBULATORY_CARE_PROVIDER_SITE_OTHER): Payer: Medicaid Other | Admitting: Family Medicine

## 2023-02-14 ENCOUNTER — Encounter: Payer: Self-pay | Admitting: Family Medicine

## 2023-02-14 VITALS — BP 128/53 | HR 61 | Ht 67.0 in | Wt 281.6 lb

## 2023-02-14 DIAGNOSIS — I10 Essential (primary) hypertension: Secondary | ICD-10-CM

## 2023-02-14 DIAGNOSIS — E1165 Type 2 diabetes mellitus with hyperglycemia: Secondary | ICD-10-CM

## 2023-02-14 DIAGNOSIS — J449 Chronic obstructive pulmonary disease, unspecified: Secondary | ICD-10-CM

## 2023-02-14 DIAGNOSIS — R809 Proteinuria, unspecified: Secondary | ICD-10-CM

## 2023-02-14 DIAGNOSIS — Z794 Long term (current) use of insulin: Secondary | ICD-10-CM | POA: Diagnosis not present

## 2023-02-14 LAB — POCT GLYCOSYLATED HEMOGLOBIN (HGB A1C): HbA1c, POC (controlled diabetic range): 8.1 % — AB (ref 0.0–7.0)

## 2023-02-14 MED ORDER — ALBUTEROL SULFATE HFA 108 (90 BASE) MCG/ACT IN AERS: INHALATION_SPRAY | RESPIRATORY_TRACT | 6 refills | Status: DC

## 2023-02-14 MED ORDER — DULERA 200-5 MCG/ACT IN AERO: 2.0000 | INHALATION_SPRAY | Freq: Two times a day (BID) | RESPIRATORY_TRACT | 6 refills | Status: DC

## 2023-02-14 MED ORDER — FREESTYLE LIBRE 2 SENSOR MISC
3 refills | Status: DC
Start: 2023-02-14 — End: 2023-05-29

## 2023-02-14 NOTE — Progress Notes (Signed)
    SUBJECTIVE:   CHIEF COMPLAINT / HPI:   Diabetes Brings in all his medications.  He feels he is eating more than before.   Plans to cut back.  No low blood sugars  Hypertension No lightheadness or edema.  Taking all medications  COPD He feels his breathing is good.  No recent exacerbations.   :   BP (!) 128/53   Pulse 61   Ht 5\' 7"  (1.702 m)   Wt 281 lb 9.6 oz (127.7 kg)   SpO2 97%   BMI 44.10 kg/m   Good spirits   ASSESSMENT/PLAN:   Type 2 diabetes mellitus with hyperglycemia, with long-term current use of insulin (HCC) Assessment & Plan: Not quite at goal.  Likely due to diet.  We discussed reducing portion size.  If he does not improve consider more potent GLP1 for weight control  Orders: -     POCT glycosylated hemoglobin (Hb A1C) -     FreeStyle Libre 2 Sensor; USE AS DIRECTED AND REPLACE EVERY 14 DAYS  Dispense: 1 each; Refill: 3  COPD, moderate (HCC) Assessment & Plan: Stable Continue Dulera and as needed albuterol   Orders: Elwin Sleight; Inhale 2 puffs into the lungs in the morning and at bedtime.  Dispense: 39 g; Refill: 6 -     Albuterol Sulfate HFA; INHALE TWO PUFFS BY MOUTH EVERY 6 HOURS AS NEEDED FOR WHEEZING OR FOR SHORTNESS OF BREATH  Dispense: 8.5 g; Refill: 6  Essential hypertension Assessment & Plan: BP Readings from Last 3 Encounters:  02/14/23 (!) 128/53  11/22/22 130/80  10/17/22 130/70   At goal continue current low dose BB and ACE   Microalbuminuria Assessment & Plan: But stable creatinine.  He is on max dose empagliflozin and I dont think his blood pressure would tolerate increasing ACE or adding spironolactone.  Will monitor and continue to work on his weight as a risk factor       Patient Instructions  Good to see you today - Thank you for coming in  Things we discussed today:  Diabetes Need to limit your diet to lose weight Smaller amounts Let me know if your blood sugar are regularly > 150  You need an diabetes eye  exam every year.  Please see your eye doctor.  Ask them to fax [place] a report of your exam   Please always bring your medication bottles  Come back to see me in 3 months    Carney Living, MD [facility]

## 2023-02-14 NOTE — Assessment & Plan Note (Addendum)
Not quite at goal.  Likely due to diet.  We discussed reducing portion size.  If he does not improve consider more potent GLP1 for weight control

## 2023-02-14 NOTE — Assessment & Plan Note (Signed)
BP Readings from Last 3 Encounters:  02/14/23 (!) 128/53  11/22/22 130/80  10/17/22 130/70   At goal continue current low dose BB and ACE

## 2023-02-14 NOTE — Patient Instructions (Addendum)
Good to see you today - Thank you for coming in  Things we discussed today:  Diabetes Need to limit your diet to lose weight Smaller amounts Let me know if your blood sugar are regularly > 150  You need an diabetes eye exam every year.  Please see your eye doctor.  Ask them to fax [place] a report of your exam   Please always bring your medication bottles  Come back to see me in 3 months

## 2023-02-14 NOTE — Assessment & Plan Note (Signed)
But stable creatinine.  He is on max dose empagliflozin and I dont think his blood pressure would tolerate increasing ACE or adding spironolactone.  Will monitor and continue to work on his weight as a risk factor

## 2023-02-14 NOTE — Assessment & Plan Note (Signed)
Stable Continue Dulera and as needed albuterol

## 2023-03-06 ENCOUNTER — Ambulatory Visit (INDEPENDENT_AMBULATORY_CARE_PROVIDER_SITE_OTHER): Payer: Medicaid Other | Admitting: Podiatry

## 2023-03-06 DIAGNOSIS — B351 Tinea unguium: Secondary | ICD-10-CM | POA: Diagnosis not present

## 2023-03-06 DIAGNOSIS — M79675 Pain in left toe(s): Secondary | ICD-10-CM

## 2023-03-06 DIAGNOSIS — M79674 Pain in right toe(s): Secondary | ICD-10-CM | POA: Diagnosis not present

## 2023-03-06 NOTE — Progress Notes (Signed)
    Subjective:  Patient ID: [name] [name], male    DOB: 02/15/1961,  MRN: [id number]   [name] [name] presents to clinic today for:  Chief Complaint  Patient presents with   Nail Problem     Routine foot care  . Patient notes nails are thick, discolored, elongated and painful in shoegear when trying to ambulate.    PCP is Carney Living, MD.  Allergies  Allergen Reactions   Shellfish Allergy Nausea And Vomiting    OYSTERS   Review of Systems: Negative except as noted in the HPI.  Objective:  There were no vitals filed for this visit.  [name] [name] is a pleasant 62 y.o. male in NAD. AAO x 3.  Vascular Examination: Capillary refill time is 3-5 seconds to toes bilateral. Palpable pedal pulses b/l LE. Digital hair present b/l. No pedal edema b/l. Skin temperature gradient WNL b/l. No varicosities b/l. No cyanosis or clubbing noted b/l.   Dermatological Examination: Pedal skin with normal turgor, texture and tone b/l. No open wounds. No interdigital macerations b/l. Toenails x10 are 3mm thick, discolored, dystrophic with subungual debris. There is pain with compression of the nail plates.  They are elongated x10      Latest Ref Rng & Units 02/14/2023   11:33 AM 11/22/2022    9:50 AM 09/07/2022   10:45 AM 05/24/2022   11:09 AM  Hemoglobin A1C  Hemoglobin-A1c 0.0 - 7.0 % 8.1  7.9  8.3  7.5    Assessment/Plan: 1. Pain due to onychomycosis of toenails of both feet     The mycotic toenails were sharply debrided x10 with sterile nail nippers and a power debriding burr to decrease bulk/thickness and length.    Return in about 3 months (around 06/06/2023) for RFC.   Clerance Lav, DPM, FACFAS [facility]     [address] [place], [place] [address]                Office [phone]  Fax [phone]

## 2023-03-17 ENCOUNTER — Telehealth: Payer: Self-pay

## 2023-03-17 NOTE — Telephone Encounter (Signed)
Patient calls nurse line requesting to schedule an apt with PCP.  Patient reports he was in the [place] in [place] due to difficulty breathing. He reports he was released and staying with his sister. He reports he will be back in [place] in a few weeks.   Patient scheduled with PCP for 7/24.  Patient reports if you wish to call him please call him at [phone].

## 2023-03-18 ENCOUNTER — Other Ambulatory Visit: Payer: Self-pay | Admitting: Family Medicine

## 2023-03-18 DIAGNOSIS — E1110 Type 2 diabetes mellitus with ketoacidosis without coma: Secondary | ICD-10-CM

## 2023-03-20 ENCOUNTER — Telehealth: Payer: Self-pay

## 2023-03-20 NOTE — Telephone Encounter (Signed)
A Prior Authorization was initiated for this patients FREESTYLE LIBRE 2 SENSOR through CoverMyMeds.   Key: [id number]

## 2023-03-21 ENCOUNTER — Other Ambulatory Visit: Payer: Self-pay | Admitting: Family Medicine

## 2023-03-23 NOTE — Telephone Encounter (Signed)
Prior Auth for patients medication FREESTYLE LIBRE 2 SENSORS approved by [facility] [employer] from 03/20/23 to 03/19/24.  CoverMyMeds Key: [id number] PA Case ID #:  [id number]

## 2023-03-28 ENCOUNTER — Other Ambulatory Visit: Payer: Self-pay | Admitting: Family Medicine

## 2023-04-05 ENCOUNTER — Other Ambulatory Visit: Payer: Self-pay

## 2023-04-05 ENCOUNTER — Ambulatory Visit: Payer: Medicaid Other | Admitting: Family Medicine

## 2023-04-05 ENCOUNTER — Encounter: Payer: Self-pay | Admitting: Family Medicine

## 2023-04-05 VITALS — BP 130/62 | HR 69 | Ht 67.0 in | Wt 275.4 lb

## 2023-04-05 DIAGNOSIS — J189 Pneumonia, unspecified organism: Secondary | ICD-10-CM

## 2023-04-05 NOTE — Assessment & Plan Note (Signed)
He was admitted COPD exacerbation and Pneumonia hospitalized in [place] end of June 2024.  Was intubated and in ICU with a 5 day total stay.  Currently nearly back to normal.  Using his inhalers regularly.  Working on weight loss.  Will monitor his exercise recovery.  If not doing well consider pulmonary referral

## 2023-04-05 NOTE — Patient Instructions (Signed)
Good to see you today - Thank you for coming in  Things we discussed today:  Keep taking the [facility] twice a day  Keep eating healthy - smaller portions   If not able to exercise normally in one month let me know   Please always bring your medication bottles  Come back to see me in in Early September

## 2023-04-05 NOTE — Progress Notes (Signed)
    SUBJECTIVE:   CHIEF COMPLAINT / HPI:   Pneumonia  He was admitted COPD exacerbation and Pneumonia hospitalized in [place] end of June 2024.  Was intubated and in ICU with a 5 day total stay.  Today feeling much better except not able to exercise quite as well as before. Finished all his antibiotics. Using [facility] twice a day every day.  Not smoking.  Using albuterol about once a day.   No fever or sputum. Brings all his medications      OBJECTIVE:   BP 130/62   Pulse 69   Ht 5\' 7"  (1.702 m)   Wt 275 lb 6.4 oz (124.9 kg)   SpO2 100%   BMI 43.13 kg/m   Alert no distress Has his pulse ox and incentive spirometer Lungs - clear without wheeze or dullness.   Sounds decreased by body habitus Heart - Regular rate and rhythm.  No murmurs, gallops or rubs.    No edema   ASSESSMENT/PLAN:   There are no diagnoses linked to this encounter.   Patient Instructions  Good to see you today - Thank you for coming in  Things we discussed today:  Keep taking the [facility] twice a day  Keep eating healthy - smaller portions   If not able to exercise normally in one month let me know   Please always bring your medication bottles  Come back to see me in in Early September   Carney Living, MD [facility]

## 2023-04-11 ENCOUNTER — Inpatient Hospital Stay (HOSPITAL_COMMUNITY): Payer: Medicaid Other

## 2023-04-11 ENCOUNTER — Telehealth: Payer: Self-pay | Admitting: Family Medicine

## 2023-04-11 ENCOUNTER — Other Ambulatory Visit: Payer: Self-pay

## 2023-04-11 ENCOUNTER — Emergency Department (HOSPITAL_COMMUNITY): Payer: Medicaid Other

## 2023-04-11 ENCOUNTER — Encounter (HOSPITAL_COMMUNITY): Payer: Self-pay | Admitting: Internal Medicine

## 2023-04-11 ENCOUNTER — Inpatient Hospital Stay (HOSPITAL_COMMUNITY)
Admission: EM | Admit: 2023-04-11 | Discharge: 2023-04-14 | DRG: 190 | Disposition: A | Discharge status: Home / Self Care | Payer: Medicaid Other | Attending: Internal Medicine | Admitting: Internal Medicine

## 2023-04-11 DIAGNOSIS — I10 Essential (primary) hypertension: Secondary | ICD-10-CM | POA: Diagnosis present

## 2023-04-11 DIAGNOSIS — Z7982 Long term (current) use of aspirin: Secondary | ICD-10-CM

## 2023-04-11 DIAGNOSIS — Z7902 Long term (current) use of antithrombotics/antiplatelets: Secondary | ICD-10-CM

## 2023-04-11 DIAGNOSIS — Z955 Presence of coronary angioplasty implant and graft: Secondary | ICD-10-CM

## 2023-04-11 DIAGNOSIS — Y92239 Unspecified place in hospital as the place of occurrence of the external cause: Secondary | ICD-10-CM | POA: Diagnosis present

## 2023-04-11 DIAGNOSIS — Z7984 Long term (current) use of oral hypoglycemic drugs: Secondary | ICD-10-CM

## 2023-04-11 DIAGNOSIS — M1990 Unspecified osteoarthritis, unspecified site: Secondary | ICD-10-CM | POA: Diagnosis present

## 2023-04-11 DIAGNOSIS — Z83438 Family history of other disorder of lipoprotein metabolism and other lipidemia: Secondary | ICD-10-CM | POA: Diagnosis not present

## 2023-04-11 DIAGNOSIS — I2510 Atherosclerotic heart disease of native coronary artery without angina pectoris: Secondary | ICD-10-CM

## 2023-04-11 DIAGNOSIS — I700 Atherosclerosis of aorta: Secondary | ICD-10-CM | POA: Diagnosis present

## 2023-04-11 DIAGNOSIS — G4733 Obstructive sleep apnea (adult) (pediatric): Secondary | ICD-10-CM | POA: Diagnosis present

## 2023-04-11 DIAGNOSIS — Z823 Family history of stroke: Secondary | ICD-10-CM

## 2023-04-11 DIAGNOSIS — J45909 Unspecified asthma, uncomplicated: Secondary | ICD-10-CM | POA: Diagnosis present

## 2023-04-11 DIAGNOSIS — Z803 Family history of malignant neoplasm of breast: Secondary | ICD-10-CM | POA: Diagnosis not present

## 2023-04-11 DIAGNOSIS — Z713 Dietary counseling and surveillance: Secondary | ICD-10-CM

## 2023-04-11 DIAGNOSIS — K219 Gastro-esophageal reflux disease without esophagitis: Secondary | ICD-10-CM | POA: Diagnosis present

## 2023-04-11 DIAGNOSIS — J9601 Acute respiratory failure with hypoxia: Secondary | ICD-10-CM | POA: Diagnosis present

## 2023-04-11 DIAGNOSIS — Z7985 Long-term (current) use of injectable non-insulin antidiabetic drugs: Secondary | ICD-10-CM

## 2023-04-11 DIAGNOSIS — T380X5A Adverse effect of glucocorticoids and synthetic analogues, initial encounter: Secondary | ICD-10-CM | POA: Diagnosis present

## 2023-04-11 DIAGNOSIS — I252 Old myocardial infarction: Secondary | ICD-10-CM | POA: Diagnosis not present

## 2023-04-11 DIAGNOSIS — E119 Type 2 diabetes mellitus without complications: Secondary | ICD-10-CM

## 2023-04-11 DIAGNOSIS — Z79899 Other long term (current) drug therapy: Secondary | ICD-10-CM

## 2023-04-11 DIAGNOSIS — E785 Hyperlipidemia, unspecified: Secondary | ICD-10-CM | POA: Diagnosis present

## 2023-04-11 DIAGNOSIS — Z7951 Long term (current) use of inhaled steroids: Secondary | ICD-10-CM

## 2023-04-11 DIAGNOSIS — J441 Chronic obstructive pulmonary disease with (acute) exacerbation: Secondary | ICD-10-CM | POA: Diagnosis not present

## 2023-04-11 DIAGNOSIS — Z87891 Personal history of nicotine dependence: Secondary | ICD-10-CM | POA: Diagnosis not present

## 2023-04-11 DIAGNOSIS — Z833 Family history of diabetes mellitus: Secondary | ICD-10-CM | POA: Diagnosis not present

## 2023-04-11 DIAGNOSIS — Z6841 Body Mass Index (BMI) 40.0 and over, adult: Secondary | ICD-10-CM | POA: Diagnosis not present

## 2023-04-11 DIAGNOSIS — E1165 Type 2 diabetes mellitus with hyperglycemia: Secondary | ICD-10-CM | POA: Diagnosis present

## 2023-04-11 DIAGNOSIS — F339 Major depressive disorder, recurrent, unspecified: Secondary | ICD-10-CM | POA: Diagnosis present

## 2023-04-11 DIAGNOSIS — Z8249 Family history of ischemic heart disease and other diseases of the circulatory system: Secondary | ICD-10-CM | POA: Diagnosis not present

## 2023-04-11 DIAGNOSIS — G8929 Other chronic pain: Secondary | ICD-10-CM | POA: Diagnosis present

## 2023-04-11 DIAGNOSIS — Z794 Long term (current) use of insulin: Secondary | ICD-10-CM

## 2023-04-11 DIAGNOSIS — M549 Dorsalgia, unspecified: Secondary | ICD-10-CM | POA: Diagnosis present

## 2023-04-11 DIAGNOSIS — F419 Anxiety disorder, unspecified: Secondary | ICD-10-CM | POA: Diagnosis present

## 2023-04-11 DIAGNOSIS — Z91013 Allergy to seafood: Secondary | ICD-10-CM

## 2023-04-11 DIAGNOSIS — E118 Type 2 diabetes mellitus with unspecified complications: Secondary | ICD-10-CM

## 2023-04-11 LAB — CBC WITH DIFFERENTIAL/PLATELET
Abs Immature Granulocytes: 0.03 10*3/uL (ref 0.00–0.07)
Basophils Absolute: 0.1 10*3/uL (ref 0.0–0.1)
Basophils Relative: 1 %
Eosinophils Absolute: 1 10*3/uL — ABNORMAL HIGH (ref 0.0–0.5)
Eosinophils Relative: 8 %
HCT: 44.9 % (ref 39.0–52.0)
Hemoglobin: 14.7 g/dL (ref 13.0–17.0)
Immature Granulocytes: 0 %
Lymphocytes Relative: 25 %
Lymphs Abs: 2.9 10*3/uL (ref 0.7–4.0)
MCH: 29.2 pg (ref 26.0–34.0)
MCHC: 32.7 g/dL (ref 30.0–36.0)
MCV: 89.1 fL (ref 80.0–100.0)
Monocytes Absolute: 0.9 10*3/uL (ref 0.1–1.0)
Monocytes Relative: 7 %
Neutro Abs: 6.9 10*3/uL (ref 1.7–7.7)
Neutrophils Relative %: 59 %
Platelets: 265 10*3/uL (ref 150–400)
RBC: 5.04 MIL/uL (ref 4.22–5.81)
RDW: 14.7 % (ref 11.5–15.5)
WBC: 11.6 10*3/uL — ABNORMAL HIGH (ref 4.0–10.5)
nRBC: 0 % (ref 0.0–0.2)

## 2023-04-11 LAB — BLOOD GAS, VENOUS
Acid-Base Excess: 2.5 mmol/L — ABNORMAL HIGH (ref 0.0–2.0)
Bicarbonate: 28.4 mmol/L — ABNORMAL HIGH (ref 20.0–28.0)
O2 Saturation: 90 %
Patient temperature: 37
pCO2, Ven: 48 mmHg (ref 44–60)
pH, Ven: 7.38 (ref 7.25–7.43)
pO2, Ven: 59 mmHg — ABNORMAL HIGH (ref 32–45)

## 2023-04-11 LAB — HEPATIC FUNCTION PANEL
ALT: 20 U/L (ref 0–44)
AST: 19 U/L (ref 15–41)
Albumin: 4 g/dL (ref 3.5–5.0)
Alkaline Phosphatase: 84 U/L (ref 38–126)
Bilirubin, Direct: <0.1 mg/dL (ref 0.0–0.2)
Total Bilirubin: 0.5 mg/dL (ref 0.3–1.2)
Total Protein: 7.5 g/dL (ref 6.5–8.1)

## 2023-04-11 LAB — BASIC METABOLIC PANEL
Anion gap: 12 (ref 5–15)
BUN: 17 mg/dL (ref 8–23)
CO2: 23 mmol/L (ref 22–32)
Calcium: 9.3 mg/dL (ref 8.9–10.3)
Chloride: 100 mmol/L (ref 98–111)
Creatinine, Ser: 0.61 mg/dL (ref 0.61–1.24)
GFR, Estimated: >60 mL/min (ref >60)
Glucose, Bld: 207 mg/dL — ABNORMAL HIGH (ref 70–99)
Potassium: 3.7 mmol/L (ref 3.5–5.1)
Sodium: 135 mmol/L (ref 135–145)

## 2023-04-11 LAB — BRAIN NATRIURETIC PEPTIDE: B Natriuretic Peptide: 34.6 pg/mL (ref 0.0–100.0)

## 2023-04-11 LAB — TROPONIN I (HIGH SENSITIVITY)
Troponin I (High Sensitivity): 5 ng/L (ref <18)
Troponin I (High Sensitivity): 9 ng/L (ref <18)

## 2023-04-11 LAB — PHOSPHORUS: Phosphorus: 4.5 mg/dL (ref 2.5–4.6)

## 2023-04-11 LAB — HIV ANTIBODY (ROUTINE TESTING W REFLEX): HIV Screen 4th Generation wRfx: NONREACTIVE

## 2023-04-11 LAB — GLUCOSE, CAPILLARY
Glucose-Capillary: 269 mg/dL — ABNORMAL HIGH (ref 70–99)
Glucose-Capillary: 228 mg/dL — ABNORMAL HIGH (ref 70–99)
Glucose-Capillary: 179 mg/dL — ABNORMAL HIGH (ref 70–99)

## 2023-04-11 LAB — MAGNESIUM: Magnesium: 2.4 mg/dL (ref 1.7–2.4)

## 2023-04-11 LAB — MRSA NEXT GEN BY PCR, NASAL: MRSA by PCR Next Gen: NOT DETECTED

## 2023-04-11 LAB — D-DIMER, QUANTITATIVE: D-Dimer, Quant: 0.87 ug/mL-FEU — ABNORMAL HIGH (ref 0.00–0.50)

## 2023-04-11 MED ORDER — PRASUGREL HCL 10 MG PO TABS
10.0000 mg | ORAL_TABLET | Freq: Every day | ORAL | Status: DC
  Administered 2023-04-11 – 2023-04-14 (×4): 10 mg via ORAL
  Filled 2023-04-11 (×4): qty 1

## 2023-04-11 MED ORDER — POTASSIUM CHLORIDE CRYS ER 20 MEQ PO TBCR
40.0000 meq | EXTENDED_RELEASE_TABLET | Freq: Once | ORAL | Status: AC
  Administered 2023-04-11: 40 meq via ORAL
  Filled 2023-04-11: qty 2

## 2023-04-11 MED ORDER — ONDANSETRON HCL 4 MG/2ML IJ SOLN: 4.0000 mg | Freq: Four times a day (QID) | INTRAMUSCULAR | Status: DC | PRN

## 2023-04-11 MED ORDER — GUAIFENESIN 100 MG/5ML PO LIQD
5.0000 mL | Freq: Once | ORAL | Status: AC
  Administered 2023-04-11: 5 mL via ORAL
  Filled 2023-04-11: qty 10

## 2023-04-11 MED ORDER — CITALOPRAM HYDROBROMIDE 20 MG PO TABS
20.0000 mg | ORAL_TABLET | Freq: Every day | ORAL | Status: DC
  Administered 2023-04-11 – 2023-04-14 (×4): 20 mg via ORAL
  Filled 2023-04-11 (×4): qty 1

## 2023-04-11 MED ORDER — ALBUTEROL SULFATE (2.5 MG/3ML) 0.083% IN NEBU
2.5000 mg | INHALATION_SOLUTION | RESPIRATORY_TRACT | Status: DC | PRN
  Administered 2023-04-12: 2.5 mg via RESPIRATORY_TRACT
  Filled 2023-04-11: qty 3

## 2023-04-11 MED ORDER — ALBUTEROL SULFATE (2.5 MG/3ML) 0.083% IN NEBU
INHALATION_SOLUTION | RESPIRATORY_TRACT | Status: AC
  Administered 2023-04-11: 10 mg via RESPIRATORY_TRACT
  Filled 2023-04-11: qty 12

## 2023-04-11 MED ORDER — ACETAMINOPHEN 650 MG RE SUPP: 650.0000 mg | Freq: Four times a day (QID) | RECTAL | Status: DC | PRN

## 2023-04-11 MED ORDER — INSULIN ASPART 100 UNIT/ML IJ SOLN
0.0000 [IU] | Freq: Three times a day (TID) | INTRAMUSCULAR | Status: DC
  Administered 2023-04-11: 11 [IU] via SUBCUTANEOUS
  Administered 2023-04-11 – 2023-04-12 (×2): 7 [IU] via SUBCUTANEOUS
  Administered 2023-04-12: 4 [IU] via SUBCUTANEOUS
  Administered 2023-04-12: 7 [IU] via SUBCUTANEOUS
  Administered 2023-04-13: 4 [IU] via SUBCUTANEOUS
  Administered 2023-04-13 (×2): 11 [IU] via SUBCUTANEOUS
  Administered 2023-04-14: 4 [IU] via SUBCUTANEOUS
  Filled 2023-04-11: qty 0.2

## 2023-04-11 MED ORDER — METFORMIN HCL 500 MG PO TABS
1000.0000 mg | ORAL_TABLET | Freq: Two times a day (BID) | ORAL | Status: DC
  Administered 2023-04-13 – 2023-04-14 (×3): 1000 mg via ORAL
  Filled 2023-04-11 (×4): qty 2

## 2023-04-11 MED ORDER — ORAL CARE MOUTH RINSE
15.0000 mL | OROMUCOSAL | Status: DC
  Administered 2023-04-11 – 2023-04-13 (×7): 15 mL via OROMUCOSAL

## 2023-04-11 MED ORDER — ONDANSETRON HCL 4 MG PO TABS: 4.0000 mg | ORAL_TABLET | Freq: Four times a day (QID) | ORAL | Status: DC | PRN

## 2023-04-11 MED ORDER — ACETAMINOPHEN 325 MG PO TABS: 650.0000 mg | ORAL_TABLET | Freq: Four times a day (QID) | ORAL | Status: DC | PRN

## 2023-04-11 MED ORDER — IPRATROPIUM-ALBUTEROL 0.5-2.5 (3) MG/3ML IN SOLN
3.0000 mL | Freq: Four times a day (QID) | RESPIRATORY_TRACT | Status: DC
  Administered 2023-04-11 – 2023-04-12 (×4): 3 mL via RESPIRATORY_TRACT
  Filled 2023-04-11 (×3): qty 3

## 2023-04-11 MED ORDER — IPRATROPIUM BROMIDE 0.02 % IN SOLN
0.5000 mg | Freq: Once | RESPIRATORY_TRACT | Status: AC
  Administered 2023-04-11: 0.5 mg via RESPIRATORY_TRACT
  Filled 2023-04-11: qty 2.5

## 2023-04-11 MED ORDER — ALBUTEROL SULFATE (2.5 MG/3ML) 0.083% IN NEBU: 2.5000 mg | INHALATION_SOLUTION | Freq: Once | RESPIRATORY_TRACT | Status: AC

## 2023-04-11 MED ORDER — MAGNESIUM SULFATE 2 GM/50ML IV SOLN
2.0000 g | Freq: Once | INTRAVENOUS | Status: AC
  Administered 2023-04-11: 2 g via INTRAVENOUS
  Filled 2023-04-11: qty 50

## 2023-04-11 MED ORDER — IPRATROPIUM-ALBUTEROL 0.5-2.5 (3) MG/3ML IN SOLN
RESPIRATORY_TRACT | Status: AC
  Administered 2023-04-11: 3 mL
  Filled 2023-04-11: qty 3

## 2023-04-11 MED ORDER — IOHEXOL 350 MG/ML SOLN
100.0000 mL | Freq: Once | INTRAVENOUS | Status: AC | PRN
  Administered 2023-04-11: 100 mL via INTRAVENOUS

## 2023-04-11 MED ORDER — ALBUTEROL SULFATE (2.5 MG/3ML) 0.083% IN NEBU: 10.0000 mg/h | INHALATION_SOLUTION | Freq: Once | RESPIRATORY_TRACT | Status: AC

## 2023-04-11 MED ORDER — ATORVASTATIN CALCIUM 40 MG PO TABS
80.0000 mg | ORAL_TABLET | Freq: Every day | ORAL | Status: DC
  Administered 2023-04-11 – 2023-04-14 (×4): 80 mg via ORAL
  Filled 2023-04-11 (×4): qty 2

## 2023-04-11 MED ORDER — SODIUM CHLORIDE (PF) 0.9 % IJ SOLN
INTRAMUSCULAR | Status: AC
  Filled 2023-04-11: qty 50

## 2023-04-11 MED ORDER — MAGNESIUM OXIDE -MG SUPPLEMENT 400 (240 MG) MG PO TABS
400.0000 mg | ORAL_TABLET | Freq: Once | ORAL | Status: AC
  Administered 2023-04-11: 400 mg via ORAL
  Filled 2023-04-11: qty 1

## 2023-04-11 MED ORDER — IPRATROPIUM-ALBUTEROL 0.5-2.5 (3) MG/3ML IN SOLN
3.0000 mL | Freq: Once | RESPIRATORY_TRACT | Status: DC
  Filled 2023-04-11: qty 3

## 2023-04-11 MED ORDER — ASPIRIN 81 MG PO CHEW
81.0000 mg | CHEWABLE_TABLET | Freq: Every day | ORAL | Status: DC
  Administered 2023-04-11 – 2023-04-14 (×4): 81 mg via ORAL
  Filled 2023-04-11 (×4): qty 1

## 2023-04-11 MED ORDER — TRAMADOL HCL 50 MG PO TABS
50.0000 mg | ORAL_TABLET | Freq: Four times a day (QID) | ORAL | Status: DC | PRN
  Administered 2023-04-11 – 2023-04-13 (×4): 50 mg via ORAL
  Filled 2023-04-11 (×4): qty 1

## 2023-04-11 MED ORDER — INSULIN GLARGINE-YFGN 100 UNIT/ML ~~LOC~~ SOLN
30.0000 [IU] | Freq: Every day | SUBCUTANEOUS | Status: DC
  Administered 2023-04-11 – 2023-04-14 (×4): 30 [IU] via SUBCUTANEOUS
  Filled 2023-04-11 (×4): qty 0.3

## 2023-04-11 MED ORDER — EMPAGLIFLOZIN 25 MG PO TABS
25.0000 mg | ORAL_TABLET | Freq: Every day | ORAL | Status: DC
  Administered 2023-04-11 – 2023-04-14 (×4): 25 mg via ORAL
  Filled 2023-04-11 (×4): qty 1

## 2023-04-11 MED ORDER — PREDNISONE 20 MG PO TABS
40.0000 mg | ORAL_TABLET | Freq: Every day | ORAL | Status: DC
  Administered 2023-04-12: 40 mg via ORAL
  Filled 2023-04-11: qty 2

## 2023-04-11 MED ORDER — CHLORHEXIDINE GLUCONATE CLOTH 2 % EX PADS
6.0000 | MEDICATED_PAD | Freq: Every day | CUTANEOUS | Status: DC
  Administered 2023-04-11 – 2023-04-12 (×2): 6 via TOPICAL

## 2023-04-11 MED ORDER — ALBUTEROL SULFATE (2.5 MG/3ML) 0.083% IN NEBU
INHALATION_SOLUTION | RESPIRATORY_TRACT | Status: AC
  Administered 2023-04-11: 2.5 mg via RESPIRATORY_TRACT
  Filled 2023-04-11: qty 3

## 2023-04-11 MED ORDER — ORAL CARE MOUTH RINSE: 15.0000 mL | OROMUCOSAL | Status: DC | PRN

## 2023-04-11 MED ORDER — CARVEDILOL 3.125 MG PO TABS
3.1250 mg | ORAL_TABLET | Freq: Two times a day (BID) | ORAL | Status: DC
  Administered 2023-04-11 – 2023-04-14 (×6): 3.125 mg via ORAL
  Filled 2023-04-11 (×6): qty 1

## 2023-04-11 MED ORDER — NITROGLYCERIN 0.4 MG SL SUBL: 0.4000 mg | SUBLINGUAL_TABLET | SUBLINGUAL | Status: DC | PRN

## 2023-04-11 MED ORDER — LISINOPRIL 5 MG PO TABS
2.5000 mg | ORAL_TABLET | Freq: Two times a day (BID) | ORAL | Status: DC
  Administered 2023-04-11 – 2023-04-14 (×5): 2.5 mg via ORAL
  Filled 2023-04-11 (×5): qty 1

## 2023-04-11 MED ORDER — METOPROLOL TARTRATE 5 MG/5ML IV SOLN
5.0000 mg | Freq: Once | INTRAVENOUS | Status: AC
  Administered 2023-04-11: 5 mg via INTRAVENOUS
  Filled 2023-04-11: qty 5

## 2023-04-11 MED ORDER — ENOXAPARIN SODIUM 60 MG/0.6ML IJ SOSY
0.5000 mg/kg | PREFILLED_SYRINGE | INTRAMUSCULAR | Status: DC
  Administered 2023-04-11 – 2023-04-13 (×3): 60 mg via SUBCUTANEOUS
  Filled 2023-04-11 (×3): qty 0.6

## 2023-04-11 NOTE — Progress Notes (Signed)
   04/11/23 2352  BiPAP/CPAP/SIPAP  BiPAP/CPAP/SIPAP Pt Type Adult  BiPAP/CPAP/SIPAP V60  Mask Type Full face mask  Mask Size Large  Set Rate 10 breaths/min  Respiratory Rate 25 breaths/min  IPAP 12 cmH20  EPAP 6 cmH2O  FiO2 (%) 30 %  Minute Ventilation 17.4  Leak 26  Peak Inspiratory Pressure (PIP) 12  Tidal Volume (Vt) 788  Patient Home Equipment No  Auto Titrate No  Press High Alarm 25 cmH2O  Press Low Alarm 5 cmH2O  CPAP/SIPAP surface wiped down Yes  BiPAP/CPAP /SiPAP Vitals  Pulse Rate 83  Resp (!) 25  SpO2 97 %  Bilateral Breath Sounds Clear;Diminished

## 2023-04-11 NOTE — Progress Notes (Signed)
   04/11/23 0300  BiPAP/CPAP/SIPAP  $ Non-Invasive Ventilator  Non-Invasive Vent Set Up;Non-Invasive Vent Initial  $ Face Mask Large  Yes  BiPAP/CPAP/SIPAP Pt Type Adult  BiPAP/CPAP/SIPAP V60  Mask Type Full face mask  Mask Size Large  Set Rate 14 breaths/min  Respiratory Rate 19 breaths/min  IPAP 14 cmH20  EPAP 7 cmH2O  FiO2 (%) 30 %  Minute Ventilation 15  Leak 16  Peak Inspiratory Pressure (PIP) 14  Tidal Volume (Vt) 878  Patient Home Equipment No  Press High Alarm 25 cmH2O  Press Low Alarm 5 cmH2O  CPAP/SIPAP surface wiped down Yes   Pt. wears CPAP at home, currently tolerating BiPAP very well, Dr. Manus Gunning, PARAMEDIC/RN aware.

## 2023-04-11 NOTE — H&P (Signed)
History and Physical    Patient: [name] [name] HYQ:[id number] DOB: [birth year] DOA: 04/11/2023 DOS: the patient was seen and examined on 04/11/2023 PCP: Carney Living, MD  Patient coming from: Home  Chief Complaint:  Chief Complaint  Patient presents with   Respiratory Distress   HPI: [name] [name] is a 62 y.o. male with medical history significant of alcoholism, anxiety, depression, osteoarthritis, COPD, CAD, history of NSTEMI, chronic back pain, GERD, hyperlipidemia, hypertension, migraine headaches, OSA on CPAP, type 2 diabetes, class III obesity who presented to the emergency department via EMS due to dyspnea associated with nonproductive cough and wheezing that started around 2100 last evening.  No travel history, sick contacts, allergens or fumes exposure. He denied fever, chills, rhinorrhea, sore throat or hemoptysis.  No chest pain, palpitations, diaphoresis, PND, orthopnea or pitting edema of the lower extremities.  No abdominal pain, nausea, emesis, diarrhea, constipation, melena or hematochezia.  No flank pain, dysuria, frequency or hematuria.  No polyuria, polydipsia, polyphagia or blurred vision.   Lab work: His CBCs are white count 11.6, hemoglobin 14.7 g/dL platelets 952.  Venous blood gas showed normal pH, normal pCO2, pO2 increased to 59 mmHg, bicarbonate 28.4 and acid-base Axis was 2.5 mmol/L.  Normal O2 saturation.  Troponin x 2 and BNP were normal.  BMP showed a glucose of 207 mg/dL, but was otherwise unremarkable.  Magnesium 2.4 and phosphorus 4.5 mg/dL.  Imaging: Portable 1 view chest radiograph with no active disease.  CTA chest with no PE, central airway thickening with increased soft tissue in the peribronchovascular interstitium findings a specific but can be seen in bronchitis.  Unchanged [place] on the right middle lobe.  Fissural nodules and subpleural nodule in the left lower lobe.  Attention to these nodules on the next lung cancer screening CT.  Multivessel  coronary artery calcifications.  Aortic atherosclerosis.   ED course: Initial vital signs were temperature 97.7 F, pulse 98, respirations 26, BP 152/84 mmHg O2 sat 86%.  The O2 sat increased to 95% after a few minutes on aerosol mask on 9 L/min, but subsequently had to be placed on BiPAP ventilation for a few hours.  He is most recent O2 sat is 89% on nasal cannula oxygen at 3 LPM.  He received a DuoNeb and Solu-Medrol with EMS.  He was given an albuterol 10 mg plus ipratropium 0.5 mg, albuterol 2.5 mg neb x 1, magnesium sulfate 2 g IVPB guaifenesin 5 mL p.o. x 1 and I added K-Lor 40 mEq p.o. x 1.  He received 5 mg of metoprolol IVP shortly after arriving to the stepdown unit with a BP of 180/103 mmHg.  His most recent BP 134/98 mmHg.  Review of Systems: As mentioned in the history of present illness. All other systems reviewed and are negative. Past Medical History:  Diagnosis Date   Alcoholism [facility])    Anxiety    Arthritis    "qwhere"   Asthma    CAD S/P percutaneous coronary angioplasty 01/18/12; 10/2013   a. pRCA 3.5 x 18 vision BMS - 4.2 mm; b. 2/'15: mCx 3.5 x 12  Rebel BMS (3.6-3.7 mm)   Chronic back pain    "mid/lower" (08/01/2014)   COPD (chronic obstructive pulmonary disease) (HCC)    "I'm seeing COPD dr now; don't know if I've got it" (08/01/2014)   Depression    GERD (gastroesophageal reflux disease)    Hyperlipidemia    Hypertension    Migraines    "once in awhile" (08/01/2014)  Non-ST elevation myocardial infarction (NSTEMI) (HCC) 10/2013   OSA on CPAP 01/19/2012   Sleep apnea    Type II diabetes mellitus [facility])    Past Surgical History:  Procedure Laterality Date   CARDIAC CATHETERIZATION  07/2014   Left Main: Short, large-caliber vessel. Widely patent. Bifurcates into the LAD and Circumflex. Angiographically normal.   CORONARY ANGIOPLASTY WITH STENT PLACEMENT  01/2012; 11/07/2013; 08/01/2014   "1 + 2 + 1"    CORONARY ANGIOPLASTY WITH STENT PLACEMENT  07/2014   Severe  single-vessel disease involving the bifurcation of OM1 and OM 2 with 99% in-stent restenosis in the bare-metal stent placed in the OM 2.   LEFT HEART CATHETERIZATION WITH CORONARY ANGIOGRAM N/A 01/20/2012   Procedure: LEFT HEART CATHETERIZATION WITH CORONARY ANGIOGRAM;  Surgeon: Corky Crafts, MD;  Location: [facility] CATH LAB;  Service: Cardiovascular;  Laterality: N/A;   LEFT HEART CATHETERIZATION WITH CORONARY ANGIOGRAM N/A 11/07/2013   Procedure: LEFT HEART CATHETERIZATION WITH CORONARY ANGIOGRAM;  Surgeon: Corky Crafts, MD;  Location: [facility] CATH LAB;  Service: Cardiovascular;  Laterality: N/A;   LEFT HEART CATHETERIZATION WITH CORONARY ANGIOGRAM N/A 08/01/2014   Procedure: LEFT HEART CATHETERIZATION WITH CORONARY ANGIOGRAM;  Surgeon: Marykay Lex, MD;  Location: [facility] CATH LAB;  Service: Cardiovascular;  Laterality: N/A;   MULTIPLE TOOTH EXTRACTIONS     PERCUTANEOUS CORONARY STENT INTERVENTION (PCI-S)  01/20/2012   Procedure: PERCUTANEOUS CORONARY STENT INTERVENTION (PCI-S);  Surgeon: Corky Crafts, MD;  Location: [facility] CATH LAB;  Service: Cardiovascular;;   PERCUTANEOUS CORONARY STENT INTERVENTION (PCI-S)  11/07/2013   Procedure: PERCUTANEOUS CORONARY STENT INTERVENTION (PCI-S);  Surgeon: Corky Crafts, MD;  Location: [facility] CATH LAB;  Service: Cardiovascular;;   Social History:  reports that he quit smoking about 5 years ago. His smoking use included cigarettes. He started smoking about 47 years ago. He has a 42.4 pack-year smoking history. He has quit using smokeless tobacco.  His smokeless tobacco use included chew. He reports that he does not currently use alcohol after a past usage of about 6.0 standard drinks of alcohol per week. He reports that he does not use drugs.  Allergies  Allergen Reactions   Shellfish Allergy Nausea And Vomiting    OYSTERS    Family History  Problem Relation Age of Onset   Breast cancer Mother    Heart attack Father 70   Hypertension Sister     Hyperlipidemia Sister    Hyperlipidemia Sister    Hypertension Sister    Diabetes Sister    Heart attack Brother    Stroke Brother    Diabetes Brother    Colon cancer Neg Hx    Esophageal cancer Neg Hx    Rectal cancer Neg Hx    Stomach cancer Neg Hx     Prior to Admission medications   Medication Sig Start Date End Date Taking? Authorizing Provider  Accu-Chek Softclix Lancets lancets USE AS INSTRUCTED TO CHECK once a day 03/20/23   Carney Living, MD  albuterol (PROAIR HFA) 108 (90 Base) MCG/ACT inhaler INHALE TWO PUFFS BY MOUTH EVERY 6 HOURS AS NEEDED FOR WHEEZING OR FOR SHORTNESS OF BREATH 02/14/23   Carney Living, MD  aspirin 81 MG chewable tablet Chew 1 tablet (81 mg total) by mouth daily. 10/04/13   [name], MD  atorvastatin (LIPITOR) 80 MG tablet TAKE ONE TABLET BY MOUTH DAILY 04/18/22   Carney Living, MD  Blood Glucose Monitoring Suppl (ACCU-CHEK GUIDE) w/Device KIT Check blood sugar once  each morning 07/24/20   Carney Living, MD  carvedilol (COREG) 3.125 MG tablet TAKE ONE TABLET BY MOUTH TWICE A DAY WITH MEALS 05/23/22   Carney Living, MD  CIPRODEX OTIC suspension  10/07/21   [provider]  citalopram (CELEXA) 20 MG tablet TAKE 1 TABLET BY MOUTH DAILY 01/18/23   Carney Living, MD  Continuous Blood Gluc Receiver (FREESTYLE LIBRE 2 READER) DEVI USE AS DIRECTED 11/15/21   Chambliss, Estill Batten, MD  Continuous Glucose Sensor (FREESTYLE LIBRE 2 SENSOR) MISC USE AS DIRECTED AND REPLACE EVERY 14 DAYS 02/14/23   Carney Living, MD  DROPLET PEN NEEDLES 29G X MISC USE ONCE DAILY WITH VICTOZA PEN 03/28/23   Westley Chandler, MD  empagliflozin (JARDIANCE) 25 MG TABS tablet Take 1 tablet (25 mg total) by mouth daily. 05/24/22   Carney Living, MD  gentamicin cream (GARAMYCIN) 0.1 % Apply to right foot once daily. 08/30/22   Freddie Breech, DPM  glucose blood (ACCU-CHEK GUIDE) test strip Use as instructed 10/11/21    Carney Living, MD  insulin glargine (LANTUS SOLOSTAR) 100 UNIT/ML Solostar Pen Inject 30 u under the skin daily 12/22/22   Carney Living, MD  Insulin Pen Needle (PEN NEEDLES) 32G X 4 MM MISC Use as directed with insulin four times daily 09/15/21   Carney Living, MD  lisinopril (ZESTRIL) 2.5 MG tablet TAKE 1 TABLET BY MOUTH TWICE A DAY 08/17/22   Carney Living, MD  Melatonin 2.5 MG CAPS Take 5 mg by mouth at bedtime.    [provider]  metFORMIN (GLUCOPHAGE) 1000 MG tablet TAKE ONE TABLET BY MOUTH TWICE A DAY WITH MEALS 01/25/23   Chambliss, Estill Batten, MD  Miconazole Nitrate 2 % AERO Spray between toes every morning. Patient taking differently: Apply 1 spray topically daily. Spray between toes every morning. 11/10/20   Freddie Breech, DPM  mometasone-formoterol (DULERA) 200-5 MCG/ACT AERO Inhale 2 puffs into the lungs in the morning and at bedtime. 02/14/23   Carney Living, MD  Multiple Vitamin (MULTIVITAMIN) tablet Take 1 tablet by mouth daily.    [provider]  nitroGLYCERIN (NITROSTAT) 0.4 MG SL tablet DISSOLVE 1 TABLET UNDER THE TONGUE FOR CHEST PAIN. IF PAIN REMAINS AFTER 5 MINUTES, CALL 911 AND REPEAT DOSE. MAX 3 TABLETS IN 15 MINUTES 11/01/21   Chambliss, Estill Batten, MD  Omega-3 Fatty Acids (FISH OIL) 1000 MG CAPS Take 1 capsule (1,000 mg total) by mouth daily. 08/01/19   Moses Manners, MD  prasugrel (EFFIENT) 10 MG TABS tablet Take 1 tablet (10 mg total) by mouth daily. 10/17/22   [place], Peter M, MD  Semaglutide, 2 MG/DOSE, (OZEMPIC, 2 MG/DOSE,) 8 MG/3ML SOPN Inject 2 mg into the skin once a week. 11/22/22   Carney Living, MD  traMADol (ULTRAM) 50 MG tablet TAKE 1 TABLET BY MOUTH EVERY 6 HOURS AS NEEDED 01/26/23   Carney Living, MD    Physical Exam: Vitals:   04/11/23 0118 04/11/23 0119 04/11/23 0124 04/11/23 0330  BP:  (!) 152/84  (!) 174/61  Pulse:  98  96  Resp:  (!) 26  (!) 22  Temp:  97.7 F (36.5 C)     TempSrc:  Oral    SpO2: 92% 95%  91%  Weight:   121.6 kg   Height:   5\' 7"  (1.702 m)    Physical Exam Vitals and nursing note reviewed.  Constitutional:  General: He is awake. He is not in acute distress.    Appearance: Normal appearance. He is morbidly obese.     Interventions: Face mask in place.  HENT:     Head: Normocephalic.     Nose: No rhinorrhea.     Mouth/Throat:     Mouth: Mucous membranes are dry.  Eyes:     General: No scleral icterus.    Pupils: Pupils are equal, round, and reactive to light.  Neck:     Vascular: No JVD.  Cardiovascular:     Rate and Rhythm: Normal rate and regular rhythm.     Heart sounds: S1 normal and S2 normal.  Pulmonary:     Effort: Tachypnea present.     Breath sounds: Decreased air movement present. Wheezing present.  Abdominal:     General: Abdomen is protuberant. Bowel sounds are normal. There is no distension.     Palpations: Abdomen is soft.     Tenderness: There is no abdominal tenderness.  Musculoskeletal:     Cervical back: Neck supple.  Skin:    General: Skin is warm.  Neurological:     General: No focal deficit present.     Mental Status: He is alert and oriented to person, place, and time.  Psychiatric:        Mood and Affect: Mood normal.        Behavior: Behavior normal. Behavior is cooperative.    Data Reviewed:  Results are pending, will review when available. Echo 05/10/2021 IMPRESSIONS:   1. Left ventricular ejection fraction, by estimation, is 60 to 65%. The  left ventricle has normal function. The left ventricle has no regional  wall motion abnormalities. There is mild left ventricular hypertrophy.  Left ventricular diastolic parameters  are consistent with Grade I diastolic dysfunction (impaired relaxation).   2. Right ventricular systolic function is normal. The right ventricular  size is normal. Tricuspid regurgitation signal is inadequate for assessing  PA pressure.   3. The mitral valve is  normal in structure. No evidence of mitral valve  regurgitation. No evidence of mitral stenosis.   4. The aortic valve is tricuspid. Aortic valve regurgitation is not  visualized. Mild aortic valve sclerosis is present, with no evidence of  aortic valve stenosis.   5. The inferior vena cava is normal in size with <50% respiratory  variability, suggesting right atrial pressure of 8 mmHg.   Assessment and Plan: Principal Problem:   COPD with acute exacerbation (HCC) Observation/telemetry Continue supplemental oxygen. Methylprednisolone 125 mg IVP x1. Followed by prednisone 40 mg p.o. daily in a.m. Scheduled and as needed bronchodilators. Follow-up CBC and chemistry in the morning.   Active Problems:   Essential hypertension Continue carvedilol 3.125 mg p.o. twice daily. Continue lisinopril 2.5 mg p.o. twice daily. Monitor BP, HR, GFR and electrolytes.    Hyperlipidemia with target LDL less than 70 Continue atorvastatin 80 mg p.o. daily.    OSA on CPAP Continue CPAP at bedtime.    CAD S/P percutaneous coronary angioplasty Continue aspirin, atorvastatin, carvedilol and prasugrel.    Grade I diastolic dysfunction Euvolemic with a normal BNP.   Continue beta-blocker and ACE inhibitor as above.    Type 2 diabetes mellitus with hyperglycemia,  with long-term current use of insulin (HCC) Continue Lantus 30 units SQ daily or formulary equivalent. Carbohydrate modified diet. CBG monitoring with RI SS.    Depression, recurrent (HCC) Continue citalopram 20 mg p.o. daily.    Advance Care Planning:   Code  Status: Full Code   Consults:   Family Communication:   Severity of Illness: The appropriate patient status for this patient is INPATIENT. Inpatient status is judged to be reasonable and necessary in order to provide the required intensity of service to ensure the patient's safety. The patient's presenting symptoms, physical exam findings, and initial radiographic and  laboratory data in the context of their chronic comorbidities is felt to place them at high risk for further clinical deterioration. Furthermore, it is not anticipated that the patient will be medically stable for discharge from the hospital within 2 midnights of admission.   * I certify that at the point of admission it is my clinical judgment that the patient will require inpatient hospital care spanning beyond 2 midnights from the point of admission due to high intensity of service, high risk for further deterioration and high frequency of surveillance required.*  Author: Bobette Mo, MD 04/11/2023 6:58 AM  For on call review [email or web address].   This document was prepared using Dragon voice recognition software and may contain some unintended transcription errors.

## 2023-04-11 NOTE — ED Triage Notes (Signed)
Pt BIBA w/ respiratory distress after being called out to his house x2 tonight. Received Neb x3; 125 Solumedrol; 2g Mag.

## 2023-04-11 NOTE — Telephone Encounter (Signed)
Called number given - rang and rang Will follow hospitalization via Epic

## 2023-04-11 NOTE — Progress Notes (Addendum)
RT took pt off BIPAP and placed on 2 LPM [place]. Pt transferred to ICU with no complications.

## 2023-04-11 NOTE — ED Provider Notes (Signed)
[place] EMERGENCY DEPARTMENT AT [facility] Provider Note   CSN: [id number] Arrival date & time: 04/11/23  0103     History  Chief Complaint  Patient presents with   Respiratory Distress    [name] [name] is a 61 y.o. male.  Level 5 caveat for respiratory distress.  Patient brought in by EMS with difficulty breathing that onset acutely about 8 PM.  He was watching television when he began to feel short of breath.  Does have a history of COPD and uses albuterol at home.  EMS reports diminished breath sounds throughout with O2 saturation of 86%.  He received nebulizer x 3, Solu-Medrol and magnesium prior to arrival.  He is feeling improved but requiring 6 L of oxygen.  He denies chest pain.  Does have a nonproductive cough.  Denies fever, chills, nausea, vomiting, leg pain or leg swelling.  States he takes albuterol for his breathing but no other medications.  Has chronic back pain which is unchanged.  The history is provided by the patient and the EMS personnel. The history is limited by the condition of the patient.       Home Medications Prior to Admission medications   Medication Sig Start Date End Date Taking? Authorizing Provider  Accu-Chek Softclix Lancets lancets USE AS INSTRUCTED TO CHECK once a day 03/20/23   Carney Living, MD  albuterol (PROAIR HFA) 108 (90 Base) MCG/ACT inhaler INHALE TWO PUFFS BY MOUTH EVERY 6 HOURS AS NEEDED FOR WHEEZING OR FOR SHORTNESS OF BREATH 02/14/23   Carney Living, MD  aspirin 81 MG chewable tablet Chew 1 tablet (81 mg total) by mouth daily. 10/04/13   [name], MD  atorvastatin (LIPITOR) 80 MG tablet TAKE ONE TABLET BY MOUTH DAILY 04/18/22   Carney Living, MD  Blood Glucose Monitoring Suppl (ACCU-CHEK GUIDE) w/Device KIT Check blood sugar once each morning 07/24/20   Carney Living, MD  carvedilol (COREG) 3.125 MG tablet TAKE ONE TABLET BY MOUTH TWICE A DAY WITH MEALS 05/23/22   Carney Living, MD   CIPRODEX OTIC suspension  10/07/21   [provider]  citalopram (CELEXA) 20 MG tablet TAKE 1 TABLET BY MOUTH DAILY 01/18/23   Carney Living, MD  Continuous Blood Gluc Receiver (FREESTYLE LIBRE 2 READER) DEVI USE AS DIRECTED 11/15/21   Carney Living, MD  Continuous Glucose Sensor (FREESTYLE LIBRE 2 SENSOR) MISC USE AS DIRECTED AND REPLACE EVERY 14 DAYS 02/14/23   Carney Living, MD  DROPLET PEN NEEDLES 29G X MISC USE ONCE DAILY WITH VICTOZA PEN 03/28/23   Westley Chandler, MD  empagliflozin (JARDIANCE) 25 MG TABS tablet Take 1 tablet (25 mg total) by mouth daily. 05/24/22   Carney Living, MD  gentamicin cream (GARAMYCIN) 0.1 % Apply to right foot once daily. 08/30/22   Freddie Breech, DPM  glucose blood (ACCU-CHEK GUIDE) test strip Use as instructed 10/11/21   Carney Living, MD  insulin glargine (LANTUS SOLOSTAR) 100 UNIT/ML Solostar Pen Inject 30 u under the skin daily 12/22/22   Carney Living, MD  Insulin Pen Needle (PEN NEEDLES) 32G X 4 MM MISC Use as directed with insulin four times daily 09/15/21   Carney Living, MD  lisinopril (ZESTRIL) 2.5 MG tablet TAKE 1 TABLET BY MOUTH TWICE A DAY 08/17/22   Carney Living, MD  Melatonin 2.5 MG CAPS Take 5 mg by mouth at bedtime.    [provider]  metFORMIN (  GLUCOPHAGE) 1000 MG tablet TAKE ONE TABLET BY MOUTH TWICE A DAY WITH MEALS 01/25/23   Chambliss, Estill Batten, MD  Miconazole Nitrate 2 % AERO Spray between toes every morning. Patient taking differently: Apply 1 spray topically daily. Spray between toes every morning. 11/10/20   Freddie Breech, DPM  mometasone-formoterol (DULERA) 200-5 MCG/ACT AERO Inhale 2 puffs into the lungs in the morning and at bedtime. 02/14/23   Carney Living, MD  Multiple Vitamin (MULTIVITAMIN) tablet Take 1 tablet by mouth daily.    [provider]  nitroGLYCERIN (NITROSTAT) 0.4 MG SL tablet DISSOLVE 1 TABLET UNDER THE TONGUE FOR  CHEST PAIN. IF PAIN REMAINS AFTER 5 MINUTES, CALL 911 AND REPEAT DOSE. MAX 3 TABLETS IN 15 MINUTES 11/01/21   Chambliss, Estill Batten, MD  Omega-3 Fatty Acids (FISH OIL) 1000 MG CAPS Take 1 capsule (1,000 mg total) by mouth daily. 08/01/19   Moses Manners, MD  prasugrel (EFFIENT) 10 MG TABS tablet Take 1 tablet (10 mg total) by mouth daily. 10/17/22   [place], Peter M, MD  Semaglutide, 2 MG/DOSE, (OZEMPIC, 2 MG/DOSE,) 8 MG/3ML SOPN Inject 2 mg into the skin once a week. 11/22/22   Carney Living, MD  traMADol (ULTRAM) 50 MG tablet TAKE 1 TABLET BY MOUTH EVERY 6 HOURS AS NEEDED 01/26/23   Carney Living, MD      Allergies    Shellfish allergy    Review of Systems   Review of Systems  Constitutional:  Negative for activity change, appetite change and fever.  HENT:  Negative for congestion and rhinorrhea.   Respiratory:  Positive for cough and shortness of breath.   Cardiovascular:  Negative for chest pain.  Gastrointestinal:  Negative for abdominal pain and nausea.  Genitourinary:  Negative for dysuria and hematuria.  Musculoskeletal:  Negative for arthralgias and myalgias.  Skin:  Negative for rash.  Neurological:  Negative for dizziness, weakness and headaches.   all other systems are negative except as noted in the HPI and PMH.    Physical Exam Updated Vital Signs BP (!) 152/84 (BP Location: Right Arm)   Pulse 98   Temp 97.7 F (36.5 C) (Oral)   Resp (!) 26   Ht 5\' 7"  (1.702 m)   Wt 121.6 kg   SpO2 95%   BMI 41.97 kg/m  Physical Exam Vitals and nursing note reviewed.  Constitutional:      General: He is in acute distress.     Appearance: He is well-developed. He is obese. He is ill-appearing.     Comments: Respiratory distress, tachypnea to the 30s, speaking in short phrases  HENT:     Head: Normocephalic and atraumatic.     Mouth/Throat:     Pharynx: No oropharyngeal exudate.  Eyes:     Conjunctiva/sclera: Conjunctivae normal.     Pupils: Pupils are equal,  round, and reactive to light.  Neck:     Comments: No meningismus. Cardiovascular:     Rate and Rhythm: Normal rate and regular rhythm.     Heart sounds: Normal heart sounds. No murmur heard. Pulmonary:     Breath sounds: Wheezing present.  Abdominal:     Palpations: Abdomen is soft.     Tenderness: There is no abdominal tenderness. There is no guarding or rebound.  Musculoskeletal:        General: No tenderness. Normal range of motion.     Cervical back: Normal range of motion and neck supple.  Skin:    General:  Skin is warm.  Neurological:     Mental Status: He is alert and oriented to person, place, and time.     Cranial Nerves: No cranial nerve deficit.     Motor: No abnormal muscle tone.     Coordination: Coordination normal.     Comments:  5/5 strength throughout. CN 2-12 intact.Equal grip strength.   Psychiatric:        Behavior: Behavior normal.     ED Results / Procedures / Treatments   Labs (all labs ordered are listed, but only abnormal results are displayed) Labs Reviewed  CBC WITH DIFFERENTIAL/PLATELET - Abnormal; Notable for the following components:      Result Value   WBC 11.6 (*)    Eosinophils Absolute 1.0 (*)    All other components within normal limits  BASIC METABOLIC PANEL - Abnormal; Notable for the following components:   Glucose, Bld 207 (*)    All other components within normal limits  BLOOD GAS, VENOUS - Abnormal; Notable for the following components:   pO2, Ven 59 (*)    Bicarbonate 28.4 (*)    Acid-Base Excess 2.5 (*)    All other components within normal limits  D-DIMER, QUANTITATIVE - Abnormal; Notable for the following components:   D-Dimer, Quant 0.87 (*)    All other components within normal limits  BRAIN NATRIURETIC PEPTIDE  TROPONIN I (HIGH SENSITIVITY)  TROPONIN I (HIGH SENSITIVITY)    EKG EKG Interpretation Date/Time:  Tuesday April 11 2023 01:10:56 EDT Ventricular Rate:  96 PR Interval:  191 QRS Duration:  88 QT  Interval:  359 QTC Calculation: 454 R Axis:   -24  Text Interpretation: Sinus rhythm Borderline left axis deviation No significant change was found Confirmed by Glynn Octave [phone]) on 04/11/2023 1:17:07 AM  Radiology DG Chest Portable 1 View  Result Date: 04/11/2023 CLINICAL DATA:  sob EXAM: PORTABLE CHEST 1 VIEW COMPARISON:  Chest x-ray 05/09/2021, CT chest 09/19/2022. FINDINGS: The heart and mediastinal contours are within normal limits. No focal consolidation. No pulmonary edema. No pleural effusion. No pneumothorax. No acute osseous abnormality. IMPRESSION: No active disease. Electronically Signed   By: Tish Frederickson M.D.   On: 04/11/2023 01:41    Procedures .Critical Care  Performed by: Glynn Octave, MD Authorized by: Glynn Octave, MD   Critical care provider statement:    Critical care time (minutes):  45   Critical care time was exclusive of:  Separately billable procedures and treating other patients   Critical care was necessary to treat or prevent imminent or life-threatening deterioration of the following conditions:  Respiratory failure   Critical care was time spent personally by me on the following activities:  Development of treatment plan with patient or surrogate, discussions with consultants, evaluation of patient's response to treatment, examination of patient, ordering and review of laboratory studies, ordering and review of radiographic studies, ordering and performing treatments and interventions, pulse oximetry, re-evaluation of patient's condition, review of old charts, blood draw for specimens and obtaining history from patient or surrogate   I assumed direction of critical care for this patient from another provider in my specialty: no     Care discussed with: admitting provider       Medications Ordered in ED Medications  albuterol (PROVENTIL) (2.5 MG/3ML) 0.083% nebulizer solution (has no administration in time range)  albuterol (PROVENTIL) (2.5  MG/3ML) 0.083% nebulizer solution (10 mg  Given 04/11/23 0124)  ipratropium (ATROVENT) nebulizer solution 0.5 mg (0.5 mg Nebulization Given 04/11/23 0124)  ED Course/ Medical Decision Making/ A&P                             Medical Decision Making Amount and/or Complexity of Data Reviewed Independent Historian: EMS Labs: ordered. Decision-making details documented in ED Course. Radiology: ordered and independent interpretation performed. Decision-making details documented in ED Course. ECG/medicine tests: ordered and independent interpretation performed. Decision-making details documented in ED Course.  Risk OTC drugs. Prescription drug management. Decision regarding hospitalization.   Respiratory distress, likely COPD exacerbation.  Tachypneic and hypoxic for EMS.  He received bronchodilators, steroids and magnesium.  EKG shows no acute ischemia.  Will obtain labs, chest x-ray, continuous nebulizer.  Work of breathing and wheezing have improved with nebulizers and steroids.  Chest x-ray negative for infiltrate.  Results reviewed interpreted by me.  No significant CO2 retention on ABG.  Suspect COPD exacerbation.  Will need admission given his hypoxia at rest down to the mid 80s.  He is placed on BiPAP on his home settings.  Will need admission for his hypoxia and ongoing respiratory distress in setting of COPD exacerbation.  Screening D-dimer to be sent.  Admit discussed with Dr. Margo Aye.       Final Clinical Impression(s) / ED Diagnoses Final diagnoses:  COPD exacerbation [facility])    Rx / DC Orders ED Discharge Orders     None         Elzada Pytel, Jeannett Senior, MD 04/11/23 [phone]

## 2023-04-11 NOTE — Telephone Encounter (Signed)
Patient called stating he is in the hospital at [facility] in ICU for his breathing he has been there since 12:30AM. He is asking Dr. Deirdre Priest to call him. He would like for doctor to call on hospital phone.   Room phone number: [phone].  Please Advise.   Thanks!

## 2023-04-11 NOTE — ED Notes (Signed)
Per Glynn Octave, MD we will not ambulate Pt d/t him still having increased work of breathing. Pt remains on monitor.

## 2023-04-12 ENCOUNTER — Encounter (HOSPITAL_COMMUNITY): Payer: Self-pay | Admitting: Internal Medicine

## 2023-04-12 DIAGNOSIS — J441 Chronic obstructive pulmonary disease with (acute) exacerbation: Secondary | ICD-10-CM | POA: Diagnosis not present

## 2023-04-12 LAB — GLUCOSE, CAPILLARY
Glucose-Capillary: 175 mg/dL — ABNORMAL HIGH (ref 70–99)
Glucose-Capillary: 212 mg/dL — ABNORMAL HIGH (ref 70–99)
Glucose-Capillary: 242 mg/dL — ABNORMAL HIGH (ref 70–99)
Glucose-Capillary: 239 mg/dL — ABNORMAL HIGH (ref 70–99)

## 2023-04-12 LAB — PROCALCITONIN: Procalcitonin: <0.1 ng/mL

## 2023-04-12 MED ORDER — MELATONIN 5 MG PO TABS
5.0000 mg | ORAL_TABLET | Freq: Every evening | ORAL | Status: DC | PRN
  Administered 2023-04-12 – 2023-04-13 (×2): 5 mg via ORAL
  Filled 2023-04-12 (×2): qty 1

## 2023-04-12 MED ORDER — HYDRALAZINE HCL 20 MG/ML IJ SOLN
10.0000 mg | Freq: Four times a day (QID) | INTRAMUSCULAR | Status: DC | PRN
  Filled 2023-04-12: qty 1

## 2023-04-12 MED ORDER — DM-GUAIFENESIN ER 30-600 MG PO TB12
1.0000 | ORAL_TABLET | Freq: Two times a day (BID) | ORAL | Status: DC
  Administered 2023-04-12 – 2023-04-14 (×5): 1 via ORAL
  Filled 2023-04-12 (×5): qty 1

## 2023-04-12 MED ORDER — MELATONIN 5 MG PO TABS
5.0000 mg | ORAL_TABLET | Freq: Once | ORAL | Status: AC
  Administered 2023-04-12: 5 mg via ORAL
  Filled 2023-04-12: qty 1

## 2023-04-12 MED ORDER — METHYLPREDNISOLONE SODIUM SUCC 40 MG IJ SOLR
40.0000 mg | Freq: Two times a day (BID) | INTRAMUSCULAR | Status: DC
  Administered 2023-04-12 – 2023-04-13 (×4): 40 mg via INTRAVENOUS
  Filled 2023-04-12 (×4): qty 1

## 2023-04-12 MED ORDER — IPRATROPIUM-ALBUTEROL 0.5-2.5 (3) MG/3ML IN SOLN
3.0000 mL | Freq: Three times a day (TID) | RESPIRATORY_TRACT | Status: DC
  Administered 2023-04-12 – 2023-04-14 (×6): 3 mL via RESPIRATORY_TRACT
  Filled 2023-04-12 (×6): qty 3

## 2023-04-12 MED ORDER — CARBAMIDE PEROXIDE 6.5 % OT SOLN
4.0000 [drp] | Freq: Two times a day (BID) | OTIC | Status: DC
  Administered 2023-04-12 – 2023-04-13 (×3): 4 [drp] via OTIC
  Filled 2023-04-12: qty 15

## 2023-04-12 NOTE — TOC Initial Note (Signed)
Transition of Care [facility]) - Initial/Assessment Note   Patient Details  Name: [name] [name] MRN: [id number] Date of Birth: 04-Mar-1961  Transition of [facility]) CM/SW Contact:    Ewing Schlein, LCSW Phone Number: 04/12/2023, 1:28 PM  Clinical Narrative: TOC following for possible discharge needs.              Expected Discharge Plan:  (TBD) Barriers to Discharge: Continued Medical Work up  Expected Discharge Plan and Services In-house Referral: Clinical Social Work Living arrangements for the past 2 months: Apartment  Prior Living Arrangements/Services Living arrangements for the past 2 months: Apartment Patient language and need for interpreter reviewed:: Yes Need for Family Participation in Patient Care: No (Comment) Care giver support system in place?: Yes (comment) Criminal Activity/Legal Involvement Pertinent to Current Situation/Hospitalization: No - Comment as needed  Activities of Daily Living Home Assistive Devices/Equipment: None ADL Screening (condition at time of admission) Patient's cognitive ability adequate to safely complete daily activities?: Yes Is the patient deaf or have difficulty hearing?: No Does the patient have difficulty seeing, even when wearing glasses/contacts?: No Does the patient have difficulty concentrating, remembering, or making decisions?: No Patient able to express need for assistance with ADLs?: Yes Does the patient have difficulty dressing or bathing?: No Independently performs ADLs?: Yes (appropriate for developmental age) Does the patient have difficulty walking or climbing stairs?: No Weakness of Legs: None Weakness of Arms/Hands: None  Emotional Assessment Orientation: : Oriented to Self, Oriented to Place, Oriented to  Time, Oriented to Situation Psych Involvement: No (comment)  Admission diagnosis:  COPD exacerbation (HCC) [J44.1] COPD with acute exacerbation (HCC) [J44.1] Patient Active Problem List   Diagnosis Date Noted   COPD  with acute exacerbation (HCC) 04/11/2023   Microalbuminuria 02/14/2023   Alkaline phosphatase elevation 09/07/2022   Degenerative joint disease 02/16/2022   Pneumonia    Colon polyps 02/18/2020   Mild nonproliferative diabetic retinopathy of both eyes without macular edema associated with type 2 diabetes mellitus (HCC) 04/30/2019   Depression, recurrent (HCC) 02/17/2016   Hearing difficulty 12/10/2014   COPD, moderate (HCC) 11/22/2014   Angina, class II (HCC) 08/01/2014   H/O NSTEMI (non-ST elevated myocardial infarction) 11/07/2013   Tobacco abuse 08/24/2012   Obesity 07/31/2012   Type 2 diabetes mellitus with hyperglycemia, with long-term current use of insulin (HCC) 06/05/2012   CAD S/P percutaneous coronary angioplasty 01/21/2012   OSA on CPAP 01/19/2012   Essential hypertension 01/18/2012   Hyperlipidemia with target LDL less than 70 01/18/2012   PCP:  Carney Living, MD Pharmacy:   [facility] [id number] - [place], [place] - [address] [place] [address] Phone: [phone] Fax: [phone]  Social Determinants of Health (SDOH) Social History: SDOH Screenings   Food Insecurity: No Food Insecurity (04/11/2023)  Housing: Low Risk  (04/11/2023)  Transportation Needs: No Transportation Needs (04/11/2023)  Utilities: Not At Risk (04/11/2023)  Depression (PHQ2-9): Medium Risk (04/05/2023)  Tobacco Use: Medium Risk (04/05/2023)   Received from [facility]   SDOH Interventions:    Readmission Risk Interventions     No data to display

## 2023-04-12 NOTE — Progress Notes (Signed)
   04/12/23 0315  BiPAP/CPAP/SIPAP  BiPAP/CPAP/SIPAP Pt Type Adult  BiPAP/CPAP/SIPAP V60  Mask Type Full face mask  Mask Size Large  Set Rate 10 breaths/min  Respiratory Rate 23 breaths/min  IPAP 12 cmH20  EPAP 6 cmH2O  FiO2 (%) 30 %  Minute Ventilation 13.9  Leak 22  Peak Inspiratory Pressure (PIP) 13  Tidal Volume (Vt) 631  Patient Home Equipment No  Auto Titrate No  Press High Alarm 25 cmH2O  Press Low Alarm 5 cmH2O  BiPAP/CPAP /SiPAP Vitals  Resp (!) 23  Bilateral Breath Sounds Clear;Diminished  MEWS Score/Color  MEWS Score 1  MEWS Score Color Green

## 2023-04-12 NOTE — Progress Notes (Signed)
   04/12/23 2323  BiPAP/CPAP/SIPAP  BiPAP/CPAP/SIPAP Pt Type Adult  BiPAP/CPAP/SIPAP V60  Mask Type Full face mask  Mask Size Large  Set Rate 10 breaths/min  Respiratory Rate 21 breaths/min  IPAP 12 cmH20  EPAP 6 cmH2O  FiO2 (%) 30 %  Minute Ventilation 12.8  Leak 20  Peak Inspiratory Pressure (PIP) 12  Tidal Volume (Vt) 617  Patient Home Equipment No  Auto Titrate No  Press High Alarm 25 cmH2O  Press Low Alarm 5 cmH2O  CPAP/SIPAP surface wiped down Yes  BiPAP/CPAP /SiPAP Vitals  Pulse Rate 82  Resp (!) 21  Bilateral Breath Sounds Clear;Diminished  MEWS Score/Color  MEWS Score 1  MEWS Score Color Green

## 2023-04-12 NOTE — Progress Notes (Signed)
PROGRESS NOTE  [name] [name]  DOB: 03/09/1961  PCP: Carney Living, MD UYQ:[id number]  DOA: 04/11/2023  LOS: 1 day  Hospital Day: 2  Brief narrative: [name] [name] is a 62 y.o. male with PMH significant for morbid obesity, OSA on CPAP, DM2, HTN, HLD, h/o alcoholism, h/o 40-pack-year smoking, CAD/stent, arthritis, chronic back pain, GERD, anxiety/depression, migraine  7/30, patient was at home, watching TV and suddenly started to have shortness of breath with nonproductive cough, wheezing.  EMS was called, noted diminished breath sound, O2 sat low at 86%, give nebulizer, Solu-Medrol, magnesium and brought to ED   Most recent hospitalization a month ago and [place] where he was visiting his mom.  He got short of breath and needed to be intubated for 2 days, discharged home after 5 days.  Reports compliance to inhalers at home.  Quit smoking 2 years ago.  Does not have outpatient pulmonologist.  In the ED, patient was afebrile, heart rate in 90s, breathing in 20s, blood pressure 152/84, required up to 9 L of oxygen to maintain saturation more than 90% Initial blood gas with pH 7.38, pCO2 48, bicarb 28 WBC count 11.6, rhythm function normal, glucose 207 D-dimer slightly elevated to 0.87 CT angio of chest did not show any evidence of pulm embolism.  But it showed central airway thickening with increased soft tissue in the peribronchovascular interstitium - findings suggestive of possible bronchitis.    Patient was given IV Solu-Medrol, bronchodilators Admitted to [facility]  Subjective: Patient was seen and examined this morning.  Pleasant elderly Caucasian male.  Propped up in bed.  On 4 L oxygen by nasal cannula. Not on supplemental oxygen at home. Overnight, no fever, heart rate in 80s, blood pressure mostly elevated up to 170s and 180s, was on BiPAP overnight, on 4 L oxygen this morning  Assessment and plan: Acute exacerbation of COPD Acute respiratory failure with hypoxia Presented with  sudden onset shortness of breath, wheezing, nonproductive cough, hypoxia Chest imaging without evidence of pneumonia but bronchitis.  Viral versus atypical bacterial. WBC count mildly elevated.  But procalcitonin level not elevated.  Currently not on antibiotics. Received IV Solu-Medrol in the ED. Continue to look sick and has wheezing.  I would schedule on IV Solu-Medrol 40 mg twice daily for now Start Mucinex DM. Continue bronchodilators Encourage incentive spirometry, ambulation On 4 L oxygen by nasal cannula this morning.  Wean down as tolerated.  Check ambulatory oxygen requirement. Recent Labs  Lab 04/11/23 0122 04/12/23 0319  WBC 11.6* 15.8*  PROCALCITON  --  <0.10   Type 2 diabetes mellitus uncontrolled with hyperglycemia A1c 8.2 on 7/30 PTA meds-Ozempic weekly, Lantus 30 units daily, Jardiance 25 mg daily, metformin 1000 mg twice daily Continued on Lantus, metformin and Jardiance.   Blood sugar elevated partly due to steroids Continue SSI/Accu-Cheks Recent Labs  Lab 04/11/23 1146 04/11/23 1537 04/11/23 2200 04/12/23 0809 04/12/23 1215  GLUCAP 269* 228* 179* 175* 212*   Essential hypertension Grade 1 diastolic dysfunction Volume status stable. Blood pressure elevated to 170s. PTA meds- carvedilol 3.125 mg p.o. twice daily, lisinopril 2.5 mg p.o. twice daily. Currently continued on all.  Blood pressure running elevated overnight and 170s.  May need additional meds.  Hydralazine IV as needed for now.  CAD/stent Aortic atherosclerosis HLD Continue aspirin, Effient, statin, Coreg   Depression Continue citalopram 20 mg p.o. daily.   Morbid Obesity  Body mass index is 41.3 kg/m. Patient has been advised to make an attempt to improve diet  and exercise patterns to aid in weight loss.  OSA on CPAP Continue nightly CPAP   Chronic back pain Arthritis Tramadol as needed  Pulmonary nodules CT chest showed unchanged appearance of right middle lobe perifissural  nodules and subpleural nodule in the left lower lobe. Attention to these nodules on the next lung cancer screening CT.   Mobility: Independent at home.  Encourage ambulation  Goals of care   Code Status: Full Code     DVT prophylaxis: Lovenox subcu    Antimicrobials: None currently Fluid: None currently Consultants: None Family Communication: None at bedside  Status: Inpatient Level of care:  Stepdown   Patient from: Home Anticipated d/c to: Likely home in 1 to 2 days Needs to continue in-hospital care:  Continues to have wheezing, oxygen requirement.  Not ready for discharge    Diet:  Diet Order             Diet heart healthy/carb modified Room service appropriate? Yes; Fluid consistency: Thin  Diet effective now                   Scheduled Meds:  aspirin  81 mg Oral Daily   atorvastatin  80 mg Oral Daily   carvedilol  3.125 mg Oral BID WC   Chlorhexidine Gluconate Cloth  6 each Topical Q0600   citalopram  20 mg Oral Daily   dextromethorphan-guaiFENesin  1 tablet Oral BID   empagliflozin  25 mg Oral Daily   enoxaparin (LOVENOX) injection  0.5 mg/kg Subcutaneous Q24H   insulin aspart  0-20 Units Subcutaneous TID WC   insulin glargine-yfgn  30 Units Subcutaneous Daily   ipratropium-albuterol  3 mL Nebulization Once   ipratropium-albuterol  3 mL Nebulization TID   lisinopril  2.5 mg Oral BID   [START ON 04/13/2023] metFORMIN  1,000 mg Oral BID WC   methylPREDNISolone (SOLU-MEDROL) injection  40 mg Intravenous Q12H   mouth rinse  15 mL Mouth Rinse 4 times per day   prasugrel  10 mg Oral Daily    PRN meds: acetaminophen **OR** acetaminophen, albuterol, hydrALAZINE, nitroGLYCERIN, ondansetron **OR** ondansetron (ZOFRAN) IV, mouth rinse, traMADol   Infusions:    Antimicrobials: Anti-infectives (From admission, onward)    None       Nutritional status:  Body mass index is 41.3 kg/m.          Objective: Vitals:   04/12/23 1250 04/12/23 1300   BP:  (!) 152/126  Pulse: 79 92  Resp: 16 20  Temp:    SpO2: 96% 95%    Intake/Output Summary (Last 24 hours) at 04/12/2023 1305 Last data filed at 04/12/2023 1229 Gross per 24 hour  Intake 1353 ml  Output 3100 ml  Net -1747 ml   Filed Weights   04/11/23 0124 04/11/23 0900  Weight: 121.6 kg 119.6 kg   Weight change: -1.964 kg Body mass index is 41.3 kg/m.   Physical Exam: General exam: Morbidly obese middle-aged Caucasian male. Skin: No rashes, lesions or ulcers. HEENT: Atraumatic, normocephalic, no obvious bleeding Lungs: Mild to moderate scattered bilateral wheezing, coughs on deep breathing CVS: Regular rate and rhythm, no murmur GI/Abd soft, nontender, distended from obesity, bowel sound present CNS: Alert, awake, oriented x 3 Psychiatry: Mood appropriate Extremities: No pedal edema, no calf tenderness  Data Review: I have personally reviewed the laboratory data and studies available.  F/u labs ordered Unresulted Labs (From admission, onward)     Start     Ordered   04/13/23 0500  Basic metabolic panel  Tomorrow morning,   R       Question:  Specimen collection method  Answer:  Lab=Lab collect   04/12/23 1006   04/13/23 0500  CBC with Differential/Platelet  Tomorrow morning,   R       Question:  Specimen collection method  Answer:  Lab=Lab collect   04/12/23 1006            Total time spent in review of labs and imaging, patient evaluation, formulation of plan, documentation and communication with family: 55 minutes  Signed, Lorin Glass, MD [facility] 04/12/2023

## 2023-04-13 DIAGNOSIS — J441 Chronic obstructive pulmonary disease with (acute) exacerbation: Secondary | ICD-10-CM | POA: Diagnosis not present

## 2023-04-13 LAB — GLUCOSE, CAPILLARY
Glucose-Capillary: 265 mg/dL — ABNORMAL HIGH (ref 70–99)
Glucose-Capillary: 187 mg/dL — ABNORMAL HIGH (ref 70–99)
Glucose-Capillary: 270 mg/dL — ABNORMAL HIGH (ref 70–99)
Glucose-Capillary: 194 mg/dL — ABNORMAL HIGH (ref 70–99)

## 2023-04-13 MED ORDER — CHLORHEXIDINE GLUCONATE CLOTH 2 % EX PADS
6.0000 | MEDICATED_PAD | Freq: Every day | CUTANEOUS | Status: DC
  Administered 2023-04-13: 6 via TOPICAL

## 2023-04-13 NOTE — Progress Notes (Signed)
   04/13/23 0308  BiPAP/CPAP/SIPAP  BiPAP/CPAP/SIPAP Pt Type Adult  BiPAP/CPAP/SIPAP V60  Mask Type Full face mask  Mask Size Large  Set Rate 10 breaths/min  Respiratory Rate 17 breaths/min  IPAP 12 cmH20  EPAP 6 cmH2O  FiO2 (%) 30 %  Minute Ventilation 11.1  Leak 20  Peak Inspiratory Pressure (PIP) 13  Tidal Volume (Vt) 601  Patient Home Equipment No  Auto Titrate No  Press High Alarm 25 cmH2O  Press Low Alarm 5 cmH2O  BiPAP/CPAP /SiPAP Vitals  Resp (!) 21  MEWS Score/Color  MEWS Score 1  MEWS Score Color [place] Si

## 2023-04-13 NOTE — Progress Notes (Signed)
PROGRESS NOTE    [name] [name]  WUJ:[id number] DOB: 18-Sep-1960 DOA: 04/11/2023 PCP: Carney Living, MD    Brief Narrative:  [name] [name] is a 62 y.o. male with PMH significant for morbid obesity, OSA on CPAP, DM2, HTN, HLD, h/o alcoholism, h/o 40-pack-year smoking, CAD/stent, arthritis, chronic back pain, GERD, anxiety/depression, migraine presented with sudden onset of shortness of breath and nonproductive cough as well as wheezing.  EMS was called at home and found 86% on room air.  Admitted with COPD exacerbation.  Initially on BiPAP and now weaning off to the nasal cannula oxygen.  CT angio of chest did not show any evidence of pulm embolism.  But it showed central airway thickening with increased soft tissue in the peribronchovascular interstitium - findings suggestive of possible bronchitis.     Patient was given IV Solu-Medrol, bronchodilators Admitted to [facility]   Assessment & Plan:   COPD with acute exacerbation, acute hypoxemic respiratory failure.  Presented with oxygen Sarson is 86%, initially needed BiPAP and nasal, oxygen.  Now improving. Continue with bronchodilator therapy, IV steroids, inhalational steroids, scheduled and as needed bronchodilators, deep breathing exercises, incentive spirometry, chest physiotherapy. Mobilize with PT OT.  Clinically stabilizing, no indication for antibiotics.  Keep on oxygen to keep saturation more than 90%.  Mobilize in the hallway and monitor for symptoms.  Use CPAP at night for his sleep apnea.   Type 2 diabetes, uncontrolled with hyperglycemia: Currently stable on insulin Essential hypertension, stable Grade 1 diastolic dysfunction, stable Coronary artery disease, hyperlipidemia, on aspirin, Effient, statin and Coreg. Depression, on citalopram Morbid obesity, counseled Sleep apnea, on CPAP at night    DVT prophylaxis: Lovenox subcu   Code Status: Full code Family Communication: None at bedside Disposition Plan: Status is:  Inpatient Remains inpatient appropriate because: Weaning off oxygen, COPD exacerbation  Patient with some clinical improvement today.  Transferred to MedSurg bed.  Mobilize in the hallway with PT OT.  Anticipate home tomorrow.  He will need a cab voucher.     Consultants:  None  Procedures:  None  Antimicrobials:  None   Subjective: Patient seen and examined.  No overnight events.  Patient tells me that he is feeling much better and almost without oxygen but he still has some wheezing and dry cough.  Afebrile overnight. "I need a taxi to go home tomorrow"  Objective: Vitals:   04/13/23 0308 04/13/23 0400 04/13/23 0415 04/13/23 0800  BP:  112/63    Pulse:  70    Resp: (!) 21 15    Temp:   (!) 97.5 F (36.4 C) 98 F (36.7 C)  TempSrc:   Axillary Oral  SpO2:  94%    Weight:      Height:        Intake/Output Summary (Last 24 hours) at 04/13/2023 1058 Last data filed at 04/13/2023 1043 Gross per 24 hour  Intake 240 ml  Output 5075 ml  Net -4835 ml   Filed Weights   04/11/23 0124 04/11/23 0900  Weight: 121.6 kg 119.6 kg    Examination:  General exam: Appears calm and comfortable  Sitting in chair.  On room air. Respiratory system: No added sounds.  On room air now. Cardiovascular system: S1 & S2 heard, RRR.  Gastrointestinal system: Obese.  Pendulous.  Nontender.   Central nervous system: Alert and oriented. No focal neurological deficits.   Data Reviewed: I have personally reviewed following labs and imaging studies  CBC: Recent Labs  Lab 04/11/23 0122 04/12/23  0319 04/13/23 0318  WBC 11.6* 15.8* 13.3*  NEUTROABS 6.9  --  11.8*  HGB 14.7 13.9 14.1  HCT 44.9 43.6 43.4  MCV 89.1 90.6 90.0  PLT 265 261 269   Basic Metabolic Panel: Recent Labs  Lab 04/11/23 0122 04/11/23 0309 04/12/23 0319 04/13/23 0318  NA 135  --  136 136  K 3.7  --  4.1 4.2  CL 100  --  103 101  CO2 23  --  21* 23  GLUCOSE 207*  --  170* 179*  BUN 17  --  21 23  CREATININE  0.61  --  0.62 0.56*  CALCIUM 9.3  --  8.6* 9.1  MG  --  2.4  --   --   PHOS  --  4.5  --   --    GFR: Estimated Creatinine Clearance: 120 mL/min (A) (by C-G formula based on SCr of 0.56 mg/dL (L)). Liver Function Tests: Recent Labs  Lab 04/11/23 0309 04/12/23 0319  AST 19 18  ALT 20 17  ALKPHOS 84 84  BILITOT 0.5 0.7  PROT 7.5 7.2  ALBUMIN 4.0 3.8   No results for input(s): "LIPASE", "AMYLASE" in the last 168 hours. No results for input(s): "AMMONIA" in the last 168 hours. Coagulation Profile: No results for input(s): "INR", "PROTIME" in the last 168 hours. Cardiac Enzymes: No results for input(s): "CKTOTAL", "CKMB", "CKMBINDEX", "TROPONINI" in the last 168 hours. BNP (last 3 results) No results for input(s): "PROBNP" in the last 8760 hours. HbA1C: Recent Labs    04/11/23 0745  HGBA1C 8.2*   CBG: Recent Labs  Lab 04/11/23 2200 04/12/23 0809 04/12/23 1215 04/12/23 1627 04/12/23 2138  GLUCAP 179* 175* 212* 242* 239*   Lipid Profile: No results for input(s): "CHOL", "HDL", "LDLCALC", "TRIG", "CHOLHDL", "LDLDIRECT" in the last 72 hours. Thyroid Function Tests: No results for input(s): "TSH", "T4TOTAL", "FREET4", "T3FREE", "THYROIDAB" in the last 72 hours. Anemia Panel: No results for input(s): "VITAMINB12", "FOLATE", "FERRITIN", "TIBC", "IRON", "RETICCTPCT" in the last 72 hours. Sepsis Labs: Recent Labs  Lab 04/12/23 0319  PROCALCITON <0.10    Recent Results (from the past 240 hour(s))  MRSA Next Gen by PCR, Nasal     Status: None   Collection Time: 04/11/23  8:42 AM   Specimen: Nasal Mucosa; Nasal Swab  Result Value Ref Range Status   MRSA by PCR Next Gen NOT DETECTED NOT DETECTED Final    Comment: (NOTE) The GeneXpert MRSA Assay (FDA approved for NASAL specimens only), is one component of a comprehensive MRSA colonization surveillance program. It is not intended to diagnose MRSA infection nor to guide or monitor treatment for MRSA infections. Test  performance is not FDA approved in patients less than 69 years old. Performed at [facility], [address] [place], [place] [address]          Radiology Studies: No results found.      Scheduled Meds:  aspirin  81 mg Oral Daily   atorvastatin  80 mg Oral Daily   carbamide peroxide  4 drop Both EARS BID   carvedilol  3.125 mg Oral BID WC   Chlorhexidine Gluconate Cloth  6 each Topical Q0600   citalopram  20 mg Oral Daily   dextromethorphan-guaiFENesin  1 tablet Oral BID   empagliflozin  25 mg Oral Daily   enoxaparin (LOVENOX) injection  0.5 mg/kg Subcutaneous Q24H   insulin aspart  0-20 Units Subcutaneous TID WC   insulin glargine-yfgn  30 Units  Subcutaneous Daily   ipratropium-albuterol  3 mL Nebulization Once   ipratropium-albuterol  3 mL Nebulization TID   lisinopril  2.5 mg Oral BID   metFORMIN  1,000 mg Oral BID WC   methylPREDNISolone (SOLU-MEDROL) injection  40 mg Intravenous Q12H   mouth rinse  15 mL Mouth Rinse 4 times per day   prasugrel  10 mg Oral Daily   Continuous Infusions:   LOS: 2 days    Time spent: 35 minutes    Dorcas Carrow, MD [facility]

## 2023-04-13 NOTE — Progress Notes (Signed)
   04/13/23 2328  BiPAP/CPAP/SIPAP  BiPAP/CPAP/SIPAP Pt Type Adult  BiPAP/CPAP/SIPAP Resmed  Mask Type Full face mask  Mask Size Large  Respiratory Rate 18 breaths/min  Flow Rate 2 lpm (2Lpm O2 bled into circuit)  Patient Home Equipment No  Auto Titrate Yes (Auto 6-20 cm H2O)

## 2023-04-13 NOTE — Plan of Care (Signed)
  Problem: Coping: Goal: Ability to adjust to condition or change in health will improve Outcome: Progressing   Problem: Health Behavior/Discharge Planning: Goal: Ability to manage health-related needs will improve Outcome: Progressing   Problem: Tissue Perfusion: Goal: Adequacy of tissue perfusion will improve Outcome: Progressing   Problem: Clinical Measurements: Goal: Will remain free from infection Outcome: Progressing Goal: Diagnostic test results will improve Outcome: Progressing

## 2023-04-14 DIAGNOSIS — J441 Chronic obstructive pulmonary disease with (acute) exacerbation: Secondary | ICD-10-CM | POA: Diagnosis not present

## 2023-04-14 LAB — GLUCOSE, CAPILLARY
Glucose-Capillary: 181 mg/dL — ABNORMAL HIGH (ref 70–99)
Glucose-Capillary: 134 mg/dL — ABNORMAL HIGH (ref 70–99)

## 2023-04-14 MED ORDER — PREDNISONE 20 MG PO TABS: 40.0000 mg | ORAL_TABLET | Freq: Every day | ORAL | 0 refills | Status: AC

## 2023-04-14 MED ORDER — DM-GUAIFENESIN ER 30-600 MG PO TB12: 1.0000 | ORAL_TABLET | Freq: Two times a day (BID) | ORAL | 0 refills | Status: AC

## 2023-04-14 NOTE — Plan of Care (Addendum)
Alert and oriented. Vitals signs stable. On room air. Refused walker recommended by PT. Awaiting discharge.   Problem: Coping: Goal: Ability to adjust to condition or change in health will improve Outcome: Adequate for Discharge   Problem: Education: Goal: Ability to describe self-care measures that may prevent or decrease complications (Diabetes Survival Skills Education) will improve Outcome: Adequate for Discharge

## 2023-04-14 NOTE — Evaluation (Signed)
Physical Therapy Evaluation Patient Details Name: [name] [name] MRN: [id number] DOB: [birth year] Today's Date: 04/14/2023  History of Present Illness  62 y.o. male with medical history significant of alcoholism, anxiety, depression, osteoarthritis, COPD, CAD, history of NSTEMI, chronic back pain, GERD, hyperlipidemia, hypertension, migraine headaches, OSA on CPAP, type 2 diabetes, class III obesity who presented to the emergency department via EMS due to dyspnea associated with nonproductive cough and wheezing that started around 2100 last evening.  Clinical Impression  The patient is ambulatory, very slow. Patient will benefit from a RW for safe ambulation.No further PT needs .3       If plan is discharge home, recommend the following: Assistance with cooking/housework   Can travel by private Tax inspector (2 wheels)  Recommendations for Other Services       Functional Status Assessment Patient has not had a recent decline in their functional status     Precautions / Restrictions Precautions Precautions: None Restrictions Weight Bearing Restrictions: No      Mobility  Bed Mobility               General bed mobility comments: in recliner    Transfers Overall transfer level: Modified independent Equipment used: Rolling walker (2 wheels), None               General transfer comment: Pt initially utilized RW while navigating through hallways in unit. Able to walk from room to end of hall. Returned to room without use of device with minimal difficulty.    Ambulation/Gait Ambulation/Gait assistance: Modified independent (Device/Increase time) Gait Distance (Feet): 150 Feet Assistive device: Rolling walker (2 wheels) Gait Pattern/deviations: WFL(Within Functional Limits) Gait velocity: decr     General Gait Details: gait is slow, ambulated x 40' with no AD,gait is slower.  Stairs            Wheelchair Mobility      Tilt Bed    Modified Rankin (Stroke Patients Only)       Balance Overall balance assessment: No apparent balance deficits (not formally assessed)                                           Pertinent Vitals/Pain Pain Assessment Pain Assessment: No/denies pain    Home Living Family/patient expects to be discharged to:: Private residence Living Arrangements: Other relatives Available Help at Discharge: Family;Available PRN/intermittently Type of Home: Other(Comment) (Townhouse) Home Access: Stairs to enter Entrance Stairs-Rails: Right;Left;Can reach both Entrance Stairs-Number of Steps: 4 Alternate Level Stairs-Number of Steps: 14 Home Layout: Two level;Bed/bath upstairs Home Equipment: None      Prior Function Prior Level of Function : Independent/Modified Independent             Mobility Comments: Pt uses no AD for functional mobility ADLs Comments: independent. Sister typically completes grocery shopping. Pt reports that he has  friend that is a cab driver and he will provide transportation also.     Hand Dominance   Dominant Hand: Right    Extremity/Trunk Assessment   Upper Extremity Assessment Upper Extremity Assessment: Overall WFL for tasks assessed    Lower Extremity Assessment Lower Extremity Assessment: Overall WFL for tasks assessed    Cervical / Trunk Assessment Cervical / Trunk Assessment: Normal  Communication   Communication: Other (comment)  Cognition Arousal/Alertness: Awake/alert Behavior During Therapy:  WFL for tasks assessed/performed Overall Cognitive Status: Within Functional Limits for tasks assessed                                          General Comments General comments (skin integrity, edema, etc.): SpO2 monitored while walking in hallway. Standing still at rest: 92-93% RA; Walking in hallway on RA: 93-96%.    Exercises     Assessment/Plan    PT Assessment Patient does not need any  further PT services  PT Problem List         PT Treatment Interventions      PT Goals (Current goals can be found in the Care Plan section)  Acute Rehab PT Goals Patient Stated Goal: go home PT Goal Formulation: All assessment and education complete, DC therapy    Frequency       Co-evaluation   Reason for Co-Treatment: To address functional/ADL transfers   OT goals addressed during session: Proper use of Adaptive equipment and DME;ADL's and self-care       AM-PAC PT "6 Clicks" Mobility  Outcome Measure Help needed turning from your back to your side while in a flat bed without using bedrails?: None Help needed moving from lying on your back to sitting on the side of a flat bed without using bedrails?: None Help needed moving to and from a bed to a chair (including a wheelchair)?: None Help needed standing up from a chair using your arms (e.g., wheelchair or bedside chair)?: None Help needed to walk in hospital room?: None Help needed climbing 3-5 steps with a railing? : A Little 6 Click Score: 23    End of Session   Activity Tolerance: Patient tolerated treatment well Patient left: in chair;with call bell/phone within reach Nurse Communication: Mobility status PT Visit Diagnosis: Unsteadiness on feet (R26.81)    Time: 4098-1191 PT Time Calculation (min) (ACUTE ONLY): 14 min   Charges:   PT Evaluation $PT Eval Low Complexity: 1 Low   PT General Charges $$ ACUTE PT VISIT: 1 Visit         [name] [name] PT Acute Rehabilitation Services Office [phone] Weekend [phone]   [name] [name] 04/14/2023, 3:06 PM

## 2023-04-14 NOTE — TOC Transition Note (Addendum)
Transition of Care [facility]) - CM/SW Discharge Note   Patient Details  Name: [name] [name] MRN: [id number] Date of Birth: February 23, 1961  [facility] CM/SW Contact:  Harriett Sine, RN Phone Number: 04/14/2023, 11:00 AM   Clinical Narrative:    Spoke with pt and friend about d/c to home with self care. Pt has follow up appt for August 22. Pt offered bus pass for transportation home, he declined.  RW recommended by PT, [name] [name] will deliver to pt in room before d/c.  Pt declined to wait for RW to be delivered to his room and left the floor without the walker. RW canc   Barriers to Discharge: Continued Medical Work up   Patient Goals and CMS Choice      Discharge Placement                         Discharge Plan and Services Additional resources added to the After Visit Summary for   In-house Referral: Clinical Social Work                                   Social Determinants of Health (SDOH) Interventions SDOH Screenings   Food Insecurity: No Food Insecurity (04/11/2023)  Housing: Low Risk  (04/11/2023)  Transportation Needs: No Transportation Needs (04/11/2023)  Utilities: Not At Risk (04/11/2023)  Depression (PHQ2-9): Medium Risk (04/05/2023)  Tobacco Use: Medium Risk (04/12/2023)     Readmission Risk Interventions     No data to display

## 2023-04-14 NOTE — Discharge Summary (Signed)
Physician Discharge Summary  [name] [name] RUE:[id number] DOB: 01-Dec-1960 DOA: 04/11/2023  PCP: Carney Living, MD  Admit date: 04/11/2023 Discharge date: 04/14/2023  Admitted From: Home Disposition: Home  Recommendations for Outpatient Follow-up:  Follow up with PCP in 1-2 weeks Please obtain BMP/CBC in one week Please follow up on the following pending results: Discharge Condition: Stable CODE STATUS: Full code Diet recommendation: Low-salt diet  Discharge summary: 62 y.o. male with PMH significant for morbid obesity, OSA on CPAP, DM2, HTN, HLD, h/o alcoholism, h/o 40-pack-year smoking, CAD/stent, arthritis, chronic back pain, GERD, anxiety/depression, migraine presented with sudden onset of shortness of breath and nonproductive cough as well as wheezing.  EMS was called at home and found 86% on room air.  Admitted with COPD exacerbation.  Initially on BiPAP and now weaning off to the nasal cannula oxygen.  On room air since last 24 hours.  CT angio of chest did not show any evidence of pulm embolism.  But it showed central airway thickening with increased soft tissue in the peribronchovascular interstitium - findings suggestive of possible bronchitis.     Patient was admitted as COPD exacerbation, acute hypoxemic respiratory failure that is multifactorial.  He was treated with aggressive bronchodilator therapy, IV steroids, inhalers and steroids and physical therapy.  He did good clinical recovery. Patient has been on room air since last 24 hours. He has been using CPAP at night. Symptoms are well-controlled now. He will continue his home medication regimen for COPD including bronchodilators and steroid inhalers, he will be prescribed 3 more days of oral prednisone.  Type 2 diabetes, uncontrolled with hyperglycemia: Currently stable on insulin Essential hypertension, stable Grade 1 diastolic dysfunction, stable Coronary artery disease, hyperlipidemia, on aspirin, Effient, statin and  Coreg. Depression, on citalopram Morbid obesity, counseled Sleep apnea, on CPAP at night  Adequately stabilized for discharge.   Discharge Diagnoses:  Principal Problem:   COPD with acute exacerbation (HCC) Active Problems:   Essential hypertension   Hyperlipidemia with target LDL less than 70   OSA on CPAP   CAD S/P percutaneous coronary angioplasty   Type 2 diabetes mellitus with hyperglycemia, with long-term current use of insulin (HCC)   Depression, recurrent (HCC)    Discharge Instructions  Discharge Instructions     Diet - low sodium heart healthy   Complete by: As directed    Diet Carb Modified   Complete by: As directed    Increase activity slowly   Complete by: As directed       Allergies as of 04/14/2023       Reactions   Shellfish Allergy Nausea And Vomiting   OYSTERS        Medication List     STOP taking these medications    Miconazole Nitrate 2 % Aero       TAKE these medications    Accu-Chek Guide test strip Generic drug: glucose blood Use as instructed   albuterol 108 (90 Base) MCG/ACT inhaler Commonly known as: ProAir HFA INHALE TWO PUFFS BY MOUTH EVERY 6 HOURS AS NEEDED FOR WHEEZING OR FOR SHORTNESS OF BREATH What changed:  how much to take how to take this when to take this reasons to take this additional instructions   aspirin 81 MG chewable tablet Chew 1 tablet (81 mg total) by mouth daily.   atorvastatin 80 MG tablet Commonly known as: LIPITOR TAKE ONE TABLET BY MOUTH DAILY   carvedilol 3.125 MG tablet Commonly known as: COREG TAKE ONE TABLET BY MOUTH TWICE  A DAY WITH MEALS   Ciprodex OTIC suspension Generic drug: ciprofloxacin-dexamethasone Place 4 drops into both ears 2 (two) times daily.   citalopram 20 MG tablet Commonly known as: CELEXA TAKE 1 TABLET BY MOUTH DAILY   dextromethorphan-guaiFENesin 30-600 MG 12hr tablet Commonly known as: MUCINEX DM Take 1 tablet by mouth 2 (two) times daily for 7 days.    Dulera 200-5 MCG/ACT Aero Generic drug: mometasone-formoterol Inhale 2 puffs into the lungs in the morning and at bedtime.   empagliflozin 25 MG Tabs tablet Commonly known as: Jardiance Take 1 tablet (25 mg total) by mouth daily.   Fish Oil 1000 MG Caps Take 1 capsule (1,000 mg total) by mouth daily.   FreeStyle Libre 2 Sensor Misc USE AS DIRECTED AND REPLACE EVERY 14 DAYS   gentamicin cream 0.1 % Commonly known as: GARAMYCIN Apply to right foot once daily. What changed:  how much to take how to take this when to take this   Lantus SoloStar 100 UNIT/ML Solostar Pen Generic drug: insulin glargine Inject 30 u under the skin daily What changed:  how much to take how to take this when to take this additional instructions   lisinopril 2.5 MG tablet Commonly known as: ZESTRIL TAKE 1 TABLET BY MOUTH TWICE A DAY   Melatonin 2.5 MG Caps Take 5 mg by mouth at bedtime.   metFORMIN 1000 MG tablet Commonly known as: GLUCOPHAGE TAKE ONE TABLET BY MOUTH TWICE A DAY WITH MEALS   multivitamin tablet Take 1 tablet by mouth daily.   nitroGLYCERIN 0.4 MG SL tablet Commonly known as: NITROSTAT DISSOLVE 1 TABLET UNDER THE TONGUE FOR CHEST PAIN. IF PAIN REMAINS AFTER 5 MINUTES, CALL 911 AND REPEAT DOSE. MAX 3 TABLETS IN 15 MINUTES What changed:  how much to take how to take this when to take this reasons to take this additional instructions   Ozempic (2 MG/DOSE) 8 MG/3ML Sopn Generic drug: Semaglutide (2 MG/DOSE) Inject 2 mg into the skin once a week. What changed: additional instructions   Pen Needles 32G X 4 MM Misc Use as directed with insulin four times daily   Droplet Pen Needles 29G X Misc Generic drug: Insulin Pen Needle USE ONCE DAILY WITH VICTOZA PEN   prasugrel 10 MG Tabs tablet Commonly known as: EFFIENT Take 1 tablet (10 mg total) by mouth daily.   predniSONE 20 MG tablet Commonly known as: DELTASONE Take 2 tablets (40 mg total) by mouth daily for  3 days.   traMADol 50 MG tablet Commonly known as: ULTRAM TAKE 1 TABLET BY MOUTH EVERY 6 HOURS AS NEEDED What changed: reasons to take this        Allergies  Allergen Reactions   Shellfish Allergy Nausea And Vomiting    OYSTERS    Consultations: None   Procedures/Studies: CT Angio Chest PE W and/or Wo Contrast  Result Date: 04/11/2023 CLINICAL DATA:  Pulmonary embolism. Low to intermediate probability. Positive D-dimer. Progressive respiratory distress. History of asthma and COPD. EXAM: CT ANGIOGRAPHY CHEST WITH CONTRAST TECHNIQUE: Multidetector CT imaging of the chest was performed using the standard protocol during bolus administration of intravenous contrast. Multiplanar CT image reconstructions and MIPs were obtained to evaluate the vascular anatomy. RADIATION DOSE REDUCTION: This exam was performed according to the departmental dose-optimization program which includes automated exposure control, adjustment of the mA and/or kV according to patient size and/or use of iterative reconstruction technique. CONTRAST:  OMNIPAQUE IOHEXOL 350 MG/ML SOLN COMPARISON:  Lung cancer screening CT  from 09/19/2022 FINDINGS: Cardiovascular: Satisfactory opacification of the pulmonary arteries to the segmental level. No evidence of pulmonary embolism. Normal heart size. No pericardial effusion. Aortic atherosclerosis and multi vessel coronary artery calcifications. Mediastinum/Nodes: No enlarged mediastinal, hilar, or axillary lymph nodes. Thyroid gland, trachea, and esophagus demonstrate no significant findings. Lungs/Pleura: No pleural effusion or airspace consolidation. Central airway thickening with increased soft tissue in the peribronchovascular interstitium. No airspace consolidation. New scar versus subsegmental atelectasis within the anterior left upper lobe, image 56/12. No suspicious pulmonary nodule or mass identified. Unchanged perifissural nodules within the right middle lobe which  likely represent intrapulmonary lymph nodes. Unchanged subpleural nodule within the periphery of the left lower lobe measuring 4 mm, image 86/12. Upper Abdomen: No acute abnormality. Musculoskeletal: No chest wall abnormality. No acute or significant osseous findings. Review of the MIP images confirms the above findings. IMPRESSION: 1. No pulmonary embolism. 2. Central airway thickening with increased soft tissue in the peribronchovascular interstitium. Findings are nonspecific but can be seen in the setting of bronchitis. 3. Unchanged appearance of right middle lobe perifissural nodules and subpleural nodule in the left lower lobe. Attention to these nodules on the next lung cancer screening CT. 4. Multi vessel coronary artery calcifications. 5. Aortic Atherosclerosis (ICD10-I70.0). Electronically Signed   By: Signa Kell M.D.   On: 04/11/2023 05:47   DG Chest Portable 1 View  Result Date: 04/11/2023 CLINICAL DATA:  sob EXAM: PORTABLE CHEST 1 VIEW COMPARISON:  Chest x-ray 05/09/2021, CT chest 09/19/2022. FINDINGS: The heart and mediastinal contours are within normal limits. No focal consolidation. No pulmonary edema. No pleural effusion. No pneumothorax. No acute osseous abnormality. IMPRESSION: No active disease. Electronically Signed   By: Tish Frederickson M.D.   On: 04/11/2023 01:41   (Echo, Carotid, EGD, Colonoscopy, ERCP)    Subjective: Patient seen and examined.  Denies overnight events.  Eager to go home.  He wanted a cab voucher.  Patient did not even wait for rolling walker.   Discharge Exam: Vitals:   04/14/23 0458 04/14/23 0754  BP: (!) 146/78   Pulse: 60   Resp: 15   Temp: 98.9 F (37.2 C)   SpO2: 97% 95%   Vitals:   04/13/23 2105 04/14/23 0212 04/14/23 0458 04/14/23 0754  BP: 135/77 138/65 (!) 146/78   Pulse: 66 64 60   Resp: 16 18 15    Temp: 98 F (36.7 C) 98.1 F (36.7 C) 98.9 F (37.2 C)   TempSrc: Oral Oral Axillary   SpO2: 96% 95% 97% 95%  Weight:      Height:         General: Pt is alert, awake, not in acute distress Cardiovascular: RRR, S1/S2 +, no rubs, no gallops Respiratory: CTA bilaterally, no wheezing, no rhonchi, no added sounds. Abdominal: Soft, NT, ND, bowel sounds +, obese and pendulous. Extremities: no edema, no cyanosis    The results of significant diagnostics from this hospitalization (including imaging, microbiology, ancillary and laboratory) are listed below for reference.     Microbiology: Recent Results (from the past 240 hour(s))  MRSA Next Gen by PCR, Nasal     Status: None   Collection Time: 04/11/23  8:42 AM   Specimen: Nasal Mucosa; Nasal Swab  Result Value Ref Range Status   MRSA by PCR Next Gen NOT DETECTED NOT DETECTED Final    Comment: (NOTE) The GeneXpert MRSA Assay (FDA approved for NASAL specimens only), is one component of a comprehensive MRSA colonization surveillance program. It is not intended  to diagnose MRSA infection nor to guide or monitor treatment for MRSA infections. Test performance is not FDA approved in patients less than 71 years old. Performed at [facility], [address] [place], [place] [address]      Labs: BNP (last 3 results) Recent Labs    04/11/23 0122  BNP 34.6   Basic Metabolic Panel: Recent Labs  Lab 04/11/23 0122 04/11/23 0309 04/12/23 0319 04/13/23 0318  NA 135  --  136 136  K 3.7  --  4.1 4.2  CL 100  --  103 101  CO2 23  --  21* 23  GLUCOSE 207*  --  170* 179*  BUN 17  --  21 23  CREATININE 0.61  --  0.62 0.56*  CALCIUM 9.3  --  8.6* 9.1  MG  --  2.4  --   --   PHOS  --  4.5  --   --    Liver Function Tests: Recent Labs  Lab 04/11/23 0309 04/12/23 0319  AST 19 18  ALT 20 17  ALKPHOS 84 84  BILITOT 0.5 0.7  PROT 7.5 7.2  ALBUMIN 4.0 3.8   No results for input(s): "LIPASE", "AMYLASE" in the last 168 hours. No results for input(s): "AMMONIA" in the last 168 hours. CBC: Recent Labs  Lab 04/11/23 0122 04/12/23 0319  04/13/23 0318  WBC 11.6* 15.8* 13.3*  NEUTROABS 6.9  --  11.8*  HGB 14.7 13.9 14.1  HCT 44.9 43.6 43.4  MCV 89.1 90.6 90.0  PLT 265 261 269   Cardiac Enzymes: No results for input(s): "CKTOTAL", "CKMB", "CKMBINDEX", "TROPONINI" in the last 168 hours. BNP: Invalid input(s): "POCBNP" CBG: Recent Labs  Lab 04/13/23 0802 04/13/23 1132 04/13/23 1740 04/13/23 2106 04/14/23 0740  GLUCAP 265* 187* 270* 194* 181*   D-Dimer No results for input(s): "DDIMER" in the last 72 hours. Hgb A1c No results for input(s): "HGBA1C" in the last 72 hours. Lipid Profile No results for input(s): "CHOL", "HDL", "LDLCALC", "TRIG", "CHOLHDL", "LDLDIRECT" in the last 72 hours. Thyroid function studies No results for input(s): "TSH", "T4TOTAL", "T3FREE", "THYROIDAB" in the last 72 hours.  Invalid input(s): "FREET3" Anemia work up No results for input(s): "VITAMINB12", "FOLATE", "FERRITIN", "TIBC", "IRON", "RETICCTPCT" in the last 72 hours. Urinalysis    Component Value Date/Time   COLORURINE YELLOW 12/16/2018 1146   APPEARANCEUR CLEAR 12/16/2018 1146   LABSPEC 1.026 12/16/2018 1146   PHURINE 7.0 12/16/2018 1146   GLUCOSEU >=500 (A) 12/16/2018 1146   HGBUR NEGATIVE 12/16/2018 1146   BILIRUBINUR NEGATIVE 12/16/2018 1146   KETONESUR NEGATIVE 12/16/2018 1146   PROTEINUR 30 (A) 12/16/2018 1146   NITRITE NEGATIVE 12/16/2018 1146   LEUKOCYTESUR NEGATIVE 12/16/2018 1146   Sepsis Labs Recent Labs  Lab 04/11/23 0122 04/12/23 0319 04/13/23 0318  WBC 11.6* 15.8* 13.3*   Microbiology Recent Results (from the past 240 hour(s))  MRSA Next Gen by PCR, Nasal     Status: None   Collection Time: 04/11/23  8:42 AM   Specimen: Nasal Mucosa; Nasal Swab  Result Value Ref Range Status   MRSA by PCR Next Gen NOT DETECTED NOT DETECTED Final    Comment: (NOTE) The GeneXpert MRSA Assay (FDA approved for NASAL specimens only), is one component of a comprehensive MRSA colonization surveillance program. It is  not intended to diagnose MRSA infection nor to guide or monitor treatment for MRSA infections. Test performance is not FDA approved in patients less than 7 years old. Performed at [facility]  [facility], [address] [place], [place] [address]      Time coordinating discharge: 32 minutes  SIGNED:   Dorcas Carrow, MD  [facility] 04/14/2023, 10:14 AM

## 2023-04-14 NOTE — Evaluation (Signed)
Occupational Therapy Evaluation/Discharge Patient Details Name: [name] [name] MRN: [id number] DOB: Jul 30, 1961 Today's Date: 04/14/2023   History of Present Illness 62 y.o. male with medical history significant of alcoholism, anxiety, depression, osteoarthritis, COPD, CAD, history of NSTEMI, chronic back pain, GERD, hyperlipidemia, hypertension, migraine headaches, OSA on CPAP, type 2 diabetes, class III obesity who presented to the emergency department via EMS due to dyspnea associated with nonproductive cough and wheezing that started around 2100 last evening.   Clinical Impression   Patient evaluated by Occupational Therapy with no further acute OT needs identified. All education has been completed and the patient has no further questions. Pt able to complete ADL tasks and functional transfers at Mod I level. No follow-up Occupational Therapy or equipment needs. OT is signing off. Thank you for this referral.  SpO2 was monitored on RA during session. SpO2 monitored while walking in hallway. Standing still at rest: 92-93% RA; Walking in hallway on RA: 93-96%.       Recommendations for follow up therapy are one component of a multi-disciplinary discharge planning process, led by the attending physician.  Recommendations may be updated based on patient status, additional functional criteria and insurance authorization.   Assistance Recommended at Discharge None  Patient can return home with the following Assist for transportation    Functional Status Assessment  Patient has not had a recent decline in their functional status  Equipment Recommendations  None recommended by OT       Precautions / Restrictions Precautions Precautions: None Restrictions Weight Bearing Restrictions: No      Mobility Bed Mobility Overal bed mobility:  (NT. Pt sitting up in recliner upon therapy arrival)   Transfers Overall transfer level: Modified independent Equipment used: Rolling walker (2 wheels),  None  General transfer comment: Pt initially utilized RW while navigating through hallways in unit. Able to walk from room to end of hall. Returned to room without use of device with minimal difficulty.      Balance Overall balance assessment: No apparent balance deficits (not formally assessed)         ADL either performed or assessed with clinical judgement   ADL Overall ADL's : Modified independent;At baseline        Vision Baseline Vision/History: 0 No visual deficits Ability to See in Adequate Light: 0 Adequate Patient Visual Report: No change from baseline Vision Assessment?: No apparent visual deficits            Pertinent Vitals/Pain Pain Assessment Pain Assessment: No/denies pain     Hand Dominance Right   Extremity/Trunk Assessment Upper Extremity Assessment Upper Extremity Assessment: Overall WFL for tasks assessed   Lower Extremity Assessment Lower Extremity Assessment: Overall WFL for tasks assessed   Cervical / Trunk Assessment Cervical / Trunk Assessment: Normal   Communication Communication Communication: Other (comment) (slight slurred speech d/t lack of teeth.)   Cognition Arousal/Alertness: Awake/alert Behavior During Therapy: WFL for tasks assessed/performed Overall Cognitive Status: Within Functional Limits for tasks assessed          General Comments  SpO2 monitored while walking in hallway. Standing still at rest: 92-93% RA; Walking in hallway on RA: 93-96%.            Home Living Family/patient expects to be discharged to:: Private residence Living Arrangements: Other relatives (Sister) Available Help at Discharge: Family;Available PRN/intermittently (sister works and pt is alone during the day. home with dog.) Type of Home: Other(Comment) (Townhouse) Home Access: Stairs to enter [employer] of Steps: 4 Entrance  Stairs-Rails: Right;Left;Can reach both Home Layout: Two level;Bed/bath upstairs Alternate Level  Stairs-Number of Steps: 14 Alternate Level Stairs-Rails: Right;Left Bathroom Shower/Tub: Chief Strategy Officer: Standard     Home Equipment: None          Prior Functioning/Environment Prior Level of Function : Independent/Modified Independent             Mobility Comments: Pt uses no AD for functional mobility ADLs Comments: independent. Sister typically completes grocery shopping. Pt reports that he has  friend that is a cab driver and he will provide transportation also.        OT Problem List: Decreased activity tolerance         OT Goals(Current goals can be found in the care plan section) Acute Rehab OT Goals Patient Stated Goal: to get oxygen at home  OT Frequency:  1X visit    Co-evaluation PT/OT/SLP Co-Evaluation/Treatment: Yes Reason for Co-Treatment: To address functional/ADL transfers   OT goals addressed during session: Proper use of Adaptive equipment and DME;ADL's and self-care      AM-PAC OT "6 Clicks" Daily Activity     Outcome Measure Help from another person eating meals?: None Help from another person taking care of personal grooming?: None Help from another person toileting, which includes using toliet, bedpan, or urinal?: None Help from another person bathing (including washing, rinsing, drying)?: None Help from another person to put on and taking off regular upper body clothing?: None Help from another person to put on and taking off regular lower body clothing?: None 6 Click Score: 24   End of Session Equipment Utilized During Treatment: Rolling walker (2 wheels)  Activity Tolerance: Patient tolerated treatment well Patient left: in chair;with call bell/phone within reach  OT Visit Diagnosis: Muscle weakness (generalized) (M62.81)                Time: 1610-9604 OT Time Calculation (min): 16 min Charges:  OT General Charges $OT Visit: 1 Visit OT Evaluation $OT Eval Low Complexity: 1 Low  Limmie Patricia, OTR/L,CBIS   Supplemental OT - [facility] and [facility] Secure Chat Preferred    , Charisse March 04/14/2023, 11:32 AM

## 2023-04-17 ENCOUNTER — Telehealth: Payer: Self-pay

## 2023-04-17 NOTE — Transitions of Care (Post Inpatient/ED Visit) (Signed)
04/17/2023  Name: [name] [name] MRN: [id number] DOB: [birth year]  Today's TOC FU Call Status: Today's TOC FU Call Status:: Successful TOC FU Call Completed Unsuccessful Call (1st Attempt) Date: 04/17/23 [facility] FU Call Complete Date: 04/17/23  Transition Care Management Follow-up Telephone Call Date of Discharge: 04/14/23 Discharge Facility: [facility]) Type of Discharge: Inpatient Admission Primary Inpatient Discharge Diagnosis:: COPD How have you been since you were released from the hospital?: Better Any questions or concerns?: No  Items Reviewed: Did you receive and understand the discharge instructions provided?: Yes Medications obtained,verified, and reconciled?: Yes (Medications Reviewed) Any new allergies since your discharge?: No Dietary orders reviewed?: Yes Do you have support at home?: No  Medications Reviewed Today: Medications Reviewed Today     Reviewed by Karena Addison, LPN (Licensed Practical Nurse) on 04/17/23 at 1642  Med List Status: <None>   Medication Order Taking? Sig Documenting Provider Last Dose Status Informant  albuterol (PROAIR HFA) 108 (90 Base) MCG/ACT inhaler [id number] No INHALE TWO PUFFS BY MOUTH EVERY 6 HOURS AS NEEDED FOR WHEEZING OR FOR SHORTNESS OF BREATH  Patient taking differently: Inhale 2 puffs into the lungs every 6 (six) hours as needed for wheezing or shortness of breath.   Carney Living, MD 04/10/2023 Active Self  aspirin 81 MG chewable tablet [id number] No Chew 1 tablet (81 mg total) by mouth daily. [name], MD 04/10/2023 Active Self  atorvastatin (LIPITOR) 80 MG tablet [id number] No TAKE ONE TABLET BY MOUTH DAILY  Patient taking differently: Take 80 mg by mouth daily.   Carney Living, MD 04/10/2023 Active Self  carvedilol (COREG) 3.125 MG tablet [id number] No TAKE ONE TABLET BY MOUTH TWICE A DAY WITH MEALS  Patient taking differently: Take 3.125 mg by mouth 2 (two) times daily with a meal.   Carney Living,  MD 04/10/2023 Active Self  CIPRODEX OTIC suspension [id number] No Place 4 drops into both ears 2 (two) times daily. [provider] 04/09/2023 Active Self  citalopram (CELEXA) 20 MG tablet [id number] No TAKE 1 TABLET BY MOUTH DAILY Carney Living, MD 04/10/2023 Active Self  Continuous Glucose Sensor (FREESTYLE LIBRE 2 SENSOR) MISC [id number]  USE AS DIRECTED AND REPLACE EVERY 14 DAYS Chambliss, Estill Batten, MD  Active Self  dextromethorphan-guaiFENesin (MUCINEX DM) 30-600 MG 12hr tablet [id number]  Take 1 tablet by mouth 2 (two) times daily for 7 days. Dorcas Carrow, MD  Active   DROPLET PEN NEEDLES 29G X MISC [id number]  USE ONCE DAILY WITH VICTOZA PEN Westley Chandler, MD  Active Self  empagliflozin (JARDIANCE) 25 MG TABS tablet [id number] No Take 1 tablet (25 mg total) by mouth daily. Carney Living, MD 04/10/2023 Active Self  gentamicin cream (GARAMYCIN) 0.1 % [id number] No Apply to right foot once daily.  Patient taking differently: Apply 1 Application topically daily. Apply to right foot once daily.   Freddie Breech, DPM 04/09/2023 Active Self  glucose blood (ACCU-CHEK GUIDE) test strip [id number] No Use as instructed Carney Living, MD Taking Active Self  insulin glargine (LANTUS SOLOSTAR) 100 UNIT/ML Solostar Pen [id number] No Inject 30 u under the skin daily  Patient taking differently: Inject 30 Units into the skin 2 (two) times daily.   Carney Living, MD 04/10/2023 Active Self  Insulin Pen Needle (PEN NEEDLES) 32G X 4 MM MISC [id number] No Use as directed with insulin four times daily Carney Living, MD Taking Active Self  lisinopril (ZESTRIL) 2.5 MG tablet [id number] No TAKE 1  TABLET BY MOUTH TWICE A DAY Carney Living, MD 04/10/2023 Active Self  Melatonin 2.5 MG CAPS [id number] No Take 5 mg by mouth at bedtime. [provider] 04/10/2023 Active Self           Med Note Phebe Colla May 10, 2021 11:20 AM)    metFORMIN  (GLUCOPHAGE) 1000 MG tablet [id number] No TAKE ONE TABLET BY MOUTH TWICE A DAY WITH MEALS Carney Living, MD 04/10/2023 Active Self  mometasone-formoterol (DULERA) 200-5 MCG/ACT AERO [id number] No Inhale 2 puffs into the lungs in the morning and at bedtime. Carney Living, MD 04/10/2023 Active Self  Multiple Vitamin (MULTIVITAMIN) tablet [id number] No Take 1 tablet by mouth daily. [provider] 04/10/2023 Active Self  nitroGLYCERIN (NITROSTAT) 0.4 MG SL tablet [id number] No DISSOLVE 1 TABLET UNDER THE TONGUE FOR CHEST PAIN. IF PAIN REMAINS AFTER 5 MINUTES, CALL 911 AND REPEAT DOSE. MAX 3 TABLETS IN 15 MINUTES  Patient taking differently: Place 0.4 mg under the tongue every 5 (five) minutes as needed for chest pain.   Carney Living, MD unklast dose Active Self  Omega-3 Fatty Acids (FISH OIL) 1000 MG CAPS [id number] No Take 1 capsule (1,000 mg total) by mouth daily. Moses Manners, MD 04/10/2023 Active Self  prasugrel (EFFIENT) 10 MG TABS tablet [id number] No Take 1 tablet (10 mg total) by mouth daily. [place], Peter M, MD 04/10/2023 Active Self  predniSONE (DELTASONE) 20 MG tablet [id number]  Take 2 tablets (40 mg total) by mouth daily for 3 days. Dorcas Carrow, MD  Active   Semaglutide, 2 MG/DOSE, (OZEMPIC, 2 MG/DOSE,) 8 MG/3ML SOPN [id number] No Inject 2 mg into the skin once a week.  Patient taking differently: Inject 2 mg into the skin once a week. Monday   Carney Living, MD 04/03/2023 Active Self  traMADol (ULTRAM) 50 MG tablet [id number] No TAKE 1 TABLET BY MOUTH EVERY 6 HOURS AS NEEDED  Patient taking differently: Take 50 mg by mouth every 6 (six) hours as needed for severe pain.   Carney Living, MD Past Month Active Self            Home Care and Equipment/Supplies: Were Home Health Services Ordered?: NA Any new equipment or medical supplies ordered?: NA  Functional Questionnaire: Do you need assistance with bathing/showering or dressing?: No Do  you need assistance with meal preparation?: No Do you need assistance with eating?: No Do you have difficulty maintaining continence: No Do you need assistance with getting out of bed/getting out of a chair/moving?: No Do you have difficulty managing or taking your medications?: No  Follow up appointments reviewed: PCP Follow-up appointment confirmed?: Yes Date of PCP follow-up appointment?: 04/25/23 Follow-up Provider: [facility] Follow-up appointment confirmed?: NA Do you need transportation to your follow-up appointment?: No Do you understand care options if your condition(s) worsen?: Yes-patient verbalized understanding    SIGNATURE Karena Addison, LPN [facility] Nurse Health Advisor Direct Dial [phone]

## 2023-04-17 NOTE — Transitions of Care (Post Inpatient/ED Visit) (Signed)
   04/17/2023  Name: [name] [name] MRN: [id number] DOB: [birth year]  Today's TOC FU Call Status: Today's TOC FU Call Status:: Unsuccessful Call (1st Attempt) Unsuccessful Call (1st Attempt) Date: 04/17/23  Attempted to reach the patient regarding the most recent Inpatient/ED visit.  Follow Up Plan: Additional outreach attempts will be made to reach the patient to complete the Transitions of Care (Post Inpatient/ED visit) call.   Signature Karena Addison, LPN [facility] Nurse Health Advisor Direct Dial [phone]

## 2023-04-25 ENCOUNTER — Other Ambulatory Visit: Payer: Self-pay

## 2023-04-25 ENCOUNTER — Other Ambulatory Visit: Payer: Self-pay | Admitting: Family Medicine

## 2023-04-25 ENCOUNTER — Encounter: Payer: Self-pay | Admitting: Family Medicine

## 2023-04-25 ENCOUNTER — Ambulatory Visit (INDEPENDENT_AMBULATORY_CARE_PROVIDER_SITE_OTHER): Payer: Medicaid Other | Admitting: Family Medicine

## 2023-04-25 VITALS — BP 122/63 | HR 68 | Ht 67.0 in | Wt 276.8 lb

## 2023-04-25 DIAGNOSIS — E785 Hyperlipidemia, unspecified: Secondary | ICD-10-CM

## 2023-04-25 DIAGNOSIS — J449 Chronic obstructive pulmonary disease, unspecified: Secondary | ICD-10-CM | POA: Diagnosis present

## 2023-04-25 DIAGNOSIS — I2510 Atherosclerotic heart disease of native coronary artery without angina pectoris: Secondary | ICD-10-CM

## 2023-04-25 MED ORDER — ALBUTEROL SULFATE HFA 108 (90 BASE) MCG/ACT IN AERS
INHALATION_SPRAY | RESPIRATORY_TRACT | 1 refills | Status: DC
Start: 2023-04-25 — End: 2023-09-01

## 2023-04-25 NOTE — Progress Notes (Signed)
    SUBJECTIVE:   CHIEF COMPLAINT / HPI:   COPD Exacerbation Follow up Feels back to normal.  Thinks might have been exposed to fumes in a garage that set off his attack  Brings in all his medications.  Using [facility] twice a day.  Not having to use albuterol  Patient admitted on 7/30 for sudden onset of shortness of breath and hypoxia.  Chest CT unremarkable except possible bronchitis Responded to standard COPD therapy and discharged on 8/2.      OBJECTIVE:   BP 122/63   Pulse 68   Ht 5\' 7"  (1.702 m)   Wt 276 lb 12.8 oz (125.6 kg)   SpO2 99%   BMI 43.35 kg/m   Alert hearing impaired no distress Lungs:  Normal respiratory effort, chest expands symmetrically. Lungs are clear to auscultation, no crackles or wheezes. Heart - Regular rate and rhythm.  No murmurs, gallops or rubs.    Extremities:  No cyanosis, edema, or deformity noted with good range of motion of all major joints.     ASSESSMENT/PLAN:   COPD, moderate (HCC) Assessment & Plan: With two recent hospitalizations for sudden onset hypoxia and shortness of breath.  No evidence of pneumonia or PE or CHF and responded to COPD therapy.  Might have an allergic trigger?   Continue current therapy and refer to pulmonary   Orders: -     Ambulatory referral to Pulmonology -     Albuterol Sulfate HFA; INHALE TWO PUFFS BY MOUTH EVERY 6 HOURS AS NEEDED FOR WHEEZING OR FOR SHORTNESS OF BREATH  Dispense: 8.5 g; Refill: 1     Patient Instructions  Good to see you today - Thank you for coming in  Things we discussed today:  I have put in a referral to Pulmonary.  They should call you to schedule an appointment.  This could appear as an unknown number on your phone.  If you have not heard from them in 2 weeks please let me know.   Keep taking your Elwin Sleight twice a day   Wear a mask when around fumes or smoke and try to avoid  Stay inside when hot and humid  Please always bring your medication bottles  Come back to see me in  September    Carney Living, MD [facility]

## 2023-04-25 NOTE — Assessment & Plan Note (Signed)
With two recent hospitalizations for sudden onset hypoxia and shortness of breath.  No evidence of pneumonia or PE or CHF and responded to COPD therapy.  Might have an allergic trigger?   Continue current therapy and refer to pulmonary

## 2023-04-25 NOTE — Patient Instructions (Signed)
Good to see you today - Thank you for coming in  Things we discussed today:  I have put in a referral to Pulmonary.  They should call you to schedule an appointment.  This could appear as an unknown number on your phone.  If you have not heard from them in 2 weeks please let me know.   Keep taking your Elwin Sleight twice a day   Wear a mask when around fumes or smoke and try to avoid  Stay inside when hot and humid  Please always bring your medication bottles  Come back to see me in September

## 2023-05-04 NOTE — Progress Notes (Signed)
05/08/2023 [name] [name]   August 26, 1961  [id number]  Primary Physician Carney Living, MD Primary Cardiologist: [name]  HPI:  [name] [name] is seen today for follow up CAD. His past medical history is significant for CAD, DM, obesity, tobacco use , hyperlipidemia and hypertension. He  is status post stenting of the right coronary artery in May of 2013. He was admitted with a NSTEMI in February of 2015. He was found to have severe disease in LCx and OM2 treated with BMS.  He was placed on dual antiplatelet therapy with aspirin plus Effient.   He presented with increased angina in October 2015. A Myoview study was intermediate risk.  He had repeat cardiac cath in November 2015.  He was found to have severe single-vessel disease involving the bifurcation of OM1 and OM 2 with 99% in-stent restenosis in the bare-metal stent placed in the OM 2. He underwent successful PCI spanning the proximal circumflex stent across OM 1 through the bare-metal stent placed in OM 2 with a Xience alpine DES stent. He was also noted to have a widely patent stent in the RCA and proximal circumflex with otherwise mild-moderate disease. He has preserved LVEF with an ejection fraction of 55-65%. He was instructed to continue with DAPT with ASA + Effient.   He was admitted in September 2022 with acute respiratory failure and hypoxia. Patient intially was hypoxic to 80s and required bipap shortly after admission. Admission labs were notable for BNP of 145 and troponin levels that trended flat (8, 12 and 12). He had a normal CXR and RVP on admission.  Patient had no signs of a pulmonary embolism with lack of tachycardia hemoptysis and a well score 0.  CHF unlikely because patient was not fluid overloaded on exam.  Patient was given IV steroids, duo nebs, was placed on azithromycin. Echocardiogram was completed and showed left ventricular ejection fraction 60 to 65% with normal left ventricular function and mild left ventricular  hypertrophy.  Presence of grade 1 diastolic dysfunction. CTA showed no evidence of pulmonary embolism but did show evidence of consolidation/collapse of lateral right middle lobe. He was treated with antibiotics and steroids with improvement. Felt to be primarily a COPD exacerbation.   Patient reports he was hospitalized in [place] in June with respiratory distress/failure. He was intubated and on ventilator for the first day. Treated for bronchitis/COPD. States he got it from riding [employer] bus to see his mother.   He was recently admitted in early August with acute COPD exacerbation - treated with steroids and bronchodilators.   On follow up today he reports he is doing well. He denies any chest pain or SOB.  He hasn't smoked in some time.  Now living in a town home with his support dog. His weight has been stable.   Current Outpatient Medications  Medication Sig Dispense Refill   albuterol (PROAIR HFA) 108 (90 Base) MCG/ACT inhaler INHALE TWO PUFFS BY MOUTH EVERY 6 HOURS AS NEEDED FOR WHEEZING OR FOR SHORTNESS OF BREATH 8.5 g 1   aspirin 81 MG chewable tablet Chew 1 tablet (81 mg total) by mouth daily. 180 tablet 2   atorvastatin (LIPITOR) 80 MG tablet TAKE 1 TABLET BY MOUTH DAILY 90 tablet 3   carvedilol (COREG) 3.125 MG tablet TAKE ONE TABLET BY MOUTH TWICE A DAY WITH MEALS (Patient taking differently: Take 3.125 mg by mouth 2 (two) times daily with a meal.) 180 tablet 3   CIPRODEX OTIC suspension Place 4 drops into both  ears 2 (two) times daily.     citalopram (CELEXA) 20 MG tablet TAKE 1 TABLET BY MOUTH DAILY 90 tablet 1   Continuous Glucose Sensor (FREESTYLE LIBRE 2 SENSOR) MISC USE AS DIRECTED AND REPLACE EVERY 14 DAYS 1 each 3   DROPLET PEN NEEDLES 29G X MISC USE ONCE DAILY WITH VICTOZA PEN 100 each 1   empagliflozin (JARDIANCE) 25 MG TABS tablet Take 1 tablet (25 mg total) by mouth daily. 90 tablet 3   gentamicin cream (GARAMYCIN) 0.1 % Apply to right foot once daily. (Patient taking  differently: Apply 1 Application topically daily. Apply to right foot once daily.) 30 g 0   glucose blood (ACCU-CHEK GUIDE) test strip Use as instructed 100 each 12   insulin glargine (LANTUS SOLOSTAR) 100 UNIT/ML Solostar Pen Inject 30 u under the skin daily (Patient taking differently: Inject 30 Units into the skin 2 (two) times daily.) 15 mL 2   Insulin Pen Needle (PEN NEEDLES) 32G X 4 MM MISC Use as directed with insulin four times daily 100 each 12   lisinopril (ZESTRIL) 2.5 MG tablet TAKE 1 TABLET BY MOUTH TWICE A DAY 180 tablet 3   Melatonin 2.5 MG CAPS Take 5 mg by mouth at bedtime.     metFORMIN (GLUCOPHAGE) 1000 MG tablet TAKE ONE TABLET BY MOUTH TWICE A DAY WITH MEALS 180 tablet 3   mometasone-formoterol (DULERA) 200-5 MCG/ACT AERO Inhale 2 puffs into the lungs in the morning and at bedtime. 39 g 6   Multiple Vitamin (MULTIVITAMIN) tablet Take 1 tablet by mouth daily.     nitroGLYCERIN (NITROSTAT) 0.4 MG SL tablet DISSOLVE 1 TABLET UNDER THE TONGUE FOR CHEST PAIN. IF PAIN REMAINS AFTER 5 MINUTES, CALL 911 AND REPEAT DOSE. MAX 3 TABLETS IN 15 MINUTES (Patient taking differently: Place 0.4 mg under the tongue every 5 (five) minutes as needed for chest pain.) 100 tablet 0   Omega-3 Fatty Acids (FISH OIL) 1000 MG CAPS Take 1 capsule (1,000 mg total) by mouth daily.     prasugrel (EFFIENT) 10 MG TABS tablet Take 1 tablet (10 mg total) by mouth daily. 90 tablet 3   Semaglutide, 2 MG/DOSE, (OZEMPIC, 2 MG/DOSE,) 8 MG/3ML SOPN Inject 2 mg into the skin once a week. (Patient taking differently: Inject 2 mg into the skin once a week. Monday) 18 mL 3   traMADol (ULTRAM) 50 MG tablet TAKE 1 TABLET BY MOUTH EVERY 6 HOURS AS NEEDED 15 tablet 2   No current facility-administered medications for this visit.    Allergies  Allergen Reactions   Shellfish Allergy Nausea And Vomiting    OYSTERS    Social History   Socioeconomic History   Marital status: Single    Spouse name: Not on file   Number  of children: 0   Years of education: 15   Highest education level: Not on file  Occupational History   Occupation: Unemployed    Comment: Used to work for [employer]   Occupation: Consulting civil engineer, Insurance underwriter [employer]  Tobacco Use   Smoking status: Former    Current packs/day: 0.00    Average packs/day: 1 pack/day for 42.4 years (42.4 ttl pk-yrs)    Types: Cigarettes    Start date: 09/13/1975    Quit date: 01/25/2018    Years since quitting: 5.2   Smokeless tobacco: Former    Types: Chew   Tobacco comments:    Bupropion helping  Vaping Use   Vaping status: Never Used  Substance  and Sexual Activity   Alcohol use: Not Currently    Alcohol/week: 6.0 standard drinks of alcohol    Types: 6 Cans of beer per week    Comment: pt reports "I quit 66 days ago" (05/09/16)   Drug use: No    Comment: Crack Cocaine (denies 05/09/16)   Sexual activity: Not Currently  Other Topics Concern   Not on file  Social History Narrative   Single.  Lives with a roommate.  Ambulates independently.   Social Determinants of Health   Financial Resource Strain: Not on file  Food Insecurity: No Food Insecurity (04/11/2023)   Hunger Vital Sign    Worried About Running Out of Food in the Last Year: Never true    Ran Out of Food in the Last Year: Never true  Transportation Needs: No Transportation Needs (04/11/2023)   PRAPARE - Administrator, Civil Service (Medical): No    Lack of Transportation (Non-Medical): No  Physical Activity: Not on file  Stress: Not on file  Social Connections: Not on file  Intimate Partner Violence: Not At Risk (04/11/2023)   Humiliation, Afraid, Rape, and Kick questionnaire    Fear of Current or Ex-Partner: No    Emotionally Abused: No    Physically Abused: No    Sexually Abused: No     Review of Systems: AS noted in HPI All other systems reviewed and are otherwise negative except as noted above.  Blood pressure 130/64, pulse 70, height 5\' 7"  (1.702 m), weight  272 lb 8 oz (123.6 kg), SpO2 94%.  GENERAL:  Well appearing obese WM in NAD HEENT:  PERRL, EOMI, sclera are clear. Oropharynx is clear. NECK:  No jugular venous distention, carotid upstroke brisk and symmetric, no bruits, no thyromegaly or adenopathy LUNGS:  Clear to auscultation bilaterally CHEST:  Unremarkable HEART:  RRR,  PMI not displaced or sustained,S1 and S2 within normal limits, no S3, no S4: no clicks, no rubs, no murmurs ABD:  Soft, nontender. BS +, no masses or bruits. No hepatomegaly, no splenomegaly EXT:  2 + pulses throughout, no edema, no cyanosis no clubbing SKIN:  Warm and dry.  No rashes NEURO:  Alert and oriented x 3. Cranial nerves II through XII intact. PSYCH:  Cognitively intact      Laboratory data:  Lab Results  Component Value Date   WBC 13.3 (H) 04/13/2023   HGB 14.1 04/13/2023   HCT 43.4 04/13/2023   PLT 269 04/13/2023   GLUCOSE 179 (H) 04/13/2023   CHOL 89 (L) 10/25/2022   TRIG 76 10/25/2022   HDL 41 10/25/2022   LDLDIRECT 70 09/02/2020   LDLCALC 32 10/25/2022   ALT 17 04/12/2023   AST 18 04/12/2023   NA 136 04/13/2023   K 4.2 04/13/2023   CL 101 04/13/2023   CREATININE 0.56 (L) 04/13/2023   BUN 23 04/13/2023   CO2 23 04/13/2023   TSH 1.100 01/18/2012   INR 0.96 07/28/2014   HGBA1C 8.2 (H) 04/11/2023   MICROALBUR 4.7 05/23/2016   Ecg today shows NSR rate 76. LAD, Normal. I have personally reviewed and interpreted this study.   Echo 05/10/21: IMPRESSIONS     1. Left ventricular ejection fraction, by estimation, is 60 to 65%. The  left ventricle has normal function. The left ventricle has no regional  wall motion abnormalities. There is mild left ventricular hypertrophy.  Left ventricular diastolic parameters  are consistent with Grade I diastolic dysfunction (impaired relaxation).   2. Right ventricular systolic  function is normal. The right ventricular  size is normal. Tricuspid regurgitation signal is inadequate for assessing  PA  pressure.   3. The mitral valve is normal in structure. No evidence of mitral valve  regurgitation. No evidence of mitral stenosis.   4. The aortic valve is tricuspid. Aortic valve regurgitation is not  visualized. Mild aortic valve sclerosis is present, with no evidence of  aortic valve stenosis.   5. The inferior vena cava is normal in size with <50% respiratory  variability, suggesting right atrial pressure of 8 mmHg.    ASSESSMENT AND PLAN:   1. CAD: Status post repeat PCI to the proximal circumflex and OM 2 in Nov. 2015 as outlined above with DES for instent restenosis. He is asymptomatic.  Continue dual antiplatelet therapy with aspirin plus Effient indefinitely due to extensive nature of stents.  Continue coreg,  statin and ACE inhibitor for secondary prevention.  2. Hypertension: Blood pressure is well-controlled. Continue lisinopril and Coreg  3. Hyperlipidemia: on statin. Last LDL 32.   4. Diabetes: per primary care. Encourage weight loss and continued physical exercise.   5. Tobacco abuse: patient has quit. Congratulated.   6. COPD. Plan referral to pulmonary  I will follow up in 6 months   Azaylea Maves [place] MD,FACC  05/08/2023 3:13 PM

## 2023-05-08 ENCOUNTER — Encounter: Payer: Self-pay | Admitting: Cardiology

## 2023-05-08 ENCOUNTER — Other Ambulatory Visit: Payer: Self-pay | Admitting: Otolaryngology

## 2023-05-08 ENCOUNTER — Ambulatory Visit: Payer: Medicaid Other | Attending: Cardiology | Admitting: Cardiology

## 2023-05-08 VITALS — BP 130/64 | HR 70 | Ht 67.0 in | Wt 272.5 lb

## 2023-05-08 DIAGNOSIS — E1165 Type 2 diabetes mellitus with hyperglycemia: Secondary | ICD-10-CM | POA: Diagnosis not present

## 2023-05-08 DIAGNOSIS — Z794 Long term (current) use of insulin: Secondary | ICD-10-CM

## 2023-05-08 DIAGNOSIS — I2510 Atherosclerotic heart disease of native coronary artery without angina pectoris: Secondary | ICD-10-CM | POA: Diagnosis not present

## 2023-05-08 DIAGNOSIS — E782 Mixed hyperlipidemia: Secondary | ICD-10-CM | POA: Diagnosis not present

## 2023-05-08 DIAGNOSIS — H9223 Otorrhagia, bilateral: Secondary | ICD-10-CM

## 2023-05-08 NOTE — Patient Instructions (Signed)
Medication Instructions:  Your physician recommends that you continue on your current medications as directed. Please refer to the Current Medication list given to you today.  *If you need a refill on your cardiac medications before your next appointment, please call your pharmacy*   Follow-Up: At [facility], you and your health needs are our priority.  As part of our continuing mission to provide you with exceptional heart care, we have created designated Provider Care Teams.  These Care Teams include your primary Cardiologist (physician) and Advanced Practice Providers (APPs -  Physician Assistants and Nurse Practitioners) who all work together to provide you with the care you need, when you need it.  We recommend signing up for the patient portal called "MyChart".  Sign up information is provided on this After Visit Summary.  MyChart is used to connect with patients for Virtual Visits (Telemedicine).  Patients are able to view lab/test results, encounter notes, upcoming appointments, etc.  Non-urgent messages can be sent to your provider as well.   To learn more about what you can do with MyChart, go to [email or web address].    Your next appointment:   6 month(s)  Provider:   Peter Martinique, MD

## 2023-05-16 ENCOUNTER — Other Ambulatory Visit: Payer: Self-pay

## 2023-05-16 ENCOUNTER — Ambulatory Visit (INDEPENDENT_AMBULATORY_CARE_PROVIDER_SITE_OTHER): Payer: Medicaid Other | Admitting: Family Medicine

## 2023-05-16 VITALS — BP 124/60 | HR 75 | Ht 67.0 in | Wt 272.8 lb

## 2023-05-16 DIAGNOSIS — J449 Chronic obstructive pulmonary disease, unspecified: Secondary | ICD-10-CM | POA: Diagnosis not present

## 2023-05-16 DIAGNOSIS — Z794 Long term (current) use of insulin: Secondary | ICD-10-CM

## 2023-05-16 DIAGNOSIS — E1165 Type 2 diabetes mellitus with hyperglycemia: Secondary | ICD-10-CM

## 2023-05-16 MED ORDER — DULERA 200-5 MCG/ACT IN AERO
2.0000 | INHALATION_SPRAY | Freq: Two times a day (BID) | RESPIRATORY_TRACT | 0 refills | Status: DC
Start: 2023-05-16 — End: 2024-05-17

## 2023-05-16 MED ORDER — LANTUS SOLOSTAR 100 UNIT/ML ~~LOC~~ SOPN
PEN_INJECTOR | SUBCUTANEOUS | 2 refills | Status: DC
Start: 2023-05-16 — End: 2023-05-22

## 2023-05-16 NOTE — Assessment & Plan Note (Signed)
Not at goal.  Discussed diet and weight control.  See AVS

## 2023-05-16 NOTE — Progress Notes (Signed)
    SUBJECTIVE:   CHIEF COMPLAINT / HPI:   Diabetes Has not made many dietary changes.  Feels his bread and peanut butter intake is too high Brings in all his medications  COPD Has not heard from pulmonary referral.  No recent shortness of breath or cough  OBJECTIVE:   BP 124/60   Pulse 75   Ht 5\' 7"  (1.702 m)   Wt 272 lb 12.8 oz (123.7 kg)   SpO2 97%   BMI 42.73 kg/m   Heart - Regular rate and rhythm.  No murmurs, gallops or rubs.    Lungs:  Normal respiratory effort, chest expands symmetrically. Lungs are clear to auscultation, no crackles or wheezes.   ASSESSMENT/PLAN:   Type 2 diabetes mellitus with hyperglycemia, with long-term current use of insulin (HCC) Assessment & Plan: Not at goal.  Discussed diet and weight control.  See AVS   Orders: -     Lantus SoloStar; Inject 30 u under the skin daily  Dispense: 15 mL; Refill: 2  COPD, moderate (HCC) Assessment & Plan: With two recent sudden hospitalizations for respiratory failure.  I gave him phone numbers to call Pulmonary for an appointment   Orders: Elwin Sleight; Inhale 2 puffs into the lungs in the morning and at bedtime.  Dispense: 39 g; Refill: 0     Patient Instructions  Good to see you today - Thank you for coming in  Things we discussed today:  [facility] in [place], [place] [address], [place], [place] [address]  3.5 mi [phone]  Try to eat less bread 1-2 sandwiches a day, try to eat 5 crackers a day   Please always bring your medication bottles  Come back to see me in October   Carney Living, MD [facility]

## 2023-05-16 NOTE — Patient Instructions (Signed)
Good to see you today - Thank you for coming in  Things we discussed today:  [facility] at [facility] in [place], [place] [address], [place], [place] [address]  3.5 mi [phone]  Try to eat less bread 1-2 sandwiches a day, try to eat 5 crackers a day   Please always bring your medication bottles  Come back to see me in October

## 2023-05-16 NOTE — Assessment & Plan Note (Signed)
With two recent sudden hospitalizations for respiratory failure.  I gave him phone numbers to call Pulmonary for an appointment

## 2023-05-20 ENCOUNTER — Other Ambulatory Visit: Payer: Self-pay | Admitting: Family Medicine

## 2023-05-20 DIAGNOSIS — Z794 Long term (current) use of insulin: Secondary | ICD-10-CM

## 2023-05-20 DIAGNOSIS — E1165 Type 2 diabetes mellitus with hyperglycemia: Secondary | ICD-10-CM

## 2023-05-20 DIAGNOSIS — I2510 Atherosclerotic heart disease of native coronary artery without angina pectoris: Secondary | ICD-10-CM

## 2023-05-20 DIAGNOSIS — I10 Essential (primary) hypertension: Secondary | ICD-10-CM

## 2023-05-24 ENCOUNTER — Ambulatory Visit
Admission: RE | Admit: 2023-05-24 | Discharge: 2023-05-24 | Disposition: A | Discharge status: Home / Self Care | Payer: Medicaid Other | Source: Ambulatory Visit | Attending: Otolaryngology | Admitting: Otolaryngology

## 2023-05-24 DIAGNOSIS — H9223 Otorrhagia, bilateral: Secondary | ICD-10-CM

## 2023-05-29 ENCOUNTER — Other Ambulatory Visit: Payer: Self-pay | Admitting: Family Medicine

## 2023-05-29 DIAGNOSIS — E1165 Type 2 diabetes mellitus with hyperglycemia: Secondary | ICD-10-CM

## 2023-06-06 ENCOUNTER — Ambulatory Visit: Payer: Medicaid Other

## 2023-06-06 DIAGNOSIS — Z23 Encounter for immunization: Secondary | ICD-10-CM

## 2023-06-06 NOTE — Progress Notes (Signed)
Patient presents to nurse clinic for influenza vaccination. Administered in LD, site unremarkable. Tolerated injection well.   Veronda Prude, RN

## 2023-06-12 ENCOUNTER — Encounter: Payer: Self-pay | Admitting: Podiatry

## 2023-06-12 ENCOUNTER — Ambulatory Visit (INDEPENDENT_AMBULATORY_CARE_PROVIDER_SITE_OTHER): Payer: Medicaid Other | Admitting: Podiatry

## 2023-06-12 DIAGNOSIS — M79674 Pain in right toe(s): Secondary | ICD-10-CM | POA: Diagnosis not present

## 2023-06-12 DIAGNOSIS — M79675 Pain in left toe(s): Secondary | ICD-10-CM

## 2023-06-12 DIAGNOSIS — B351 Tinea unguium: Secondary | ICD-10-CM

## 2023-06-12 DIAGNOSIS — M7751 Other enthesopathy of right foot: Secondary | ICD-10-CM | POA: Diagnosis not present

## 2023-06-12 MED ORDER — MELOXICAM 7.5 MG PO TABS: 7.5000 mg | ORAL_TABLET | Freq: Every day | ORAL | 0 refills | Status: DC

## 2023-06-12 NOTE — Progress Notes (Unsigned)
Subjective:  Patient ID: [name] [name], male    DOB: Jan 26, 1961,  MRN: [id number]  [name] [name] presents to clinic today for:  Chief Complaint  Patient presents with   Debridement    Trim toenails/occasionally burning at the toes on the right foot, but not today-diabetic - last A1c was 8.2   Patient notes nails are thick, discolored, elongated and painful in shoegear when trying to ambulate.  He also notes some pain in the right foot.  Denies injury.  States that its generally aches all over.  PCP is Carney Living, MD.  Past Medical History:  Diagnosis Date   Alcoholism [facility])    Anxiety    Arthritis    "qwhere"   Asthma    CAD S/P percutaneous coronary angioplasty 01/18/12; 10/2013   a. pRCA 3.5 x 18 vision BMS - 4.2 mm; b. 2/'15: mCx 3.5 x 12  Rebel BMS (3.6-3.7 mm)   Chronic back pain    "mid/lower" (08/01/2014)   COPD (chronic obstructive pulmonary disease) (HCC)    "I'm seeing COPD dr now; don't know if I've got it" (08/01/2014)   Depression    GERD (gastroesophageal reflux disease)    Hyperlipidemia    Hypertension    Migraines    "once in awhile" (08/01/2014)   Non-ST elevation myocardial infarction (NSTEMI) (HCC) 10/2013   OSA on CPAP 01/19/2012   Sleep apnea    Type II diabetes mellitus (HCC)     Past Surgical History:  Procedure Laterality Date   CARDIAC CATHETERIZATION  07/2014   Left Main: Short, large-caliber vessel. Widely patent. Bifurcates into the LAD and Circumflex. Angiographically normal.   CORONARY ANGIOPLASTY WITH STENT PLACEMENT  01/2012; 11/07/2013; 08/01/2014   "1 + 2 + 1"    CORONARY ANGIOPLASTY WITH STENT PLACEMENT  07/2014   Severe single-vessel disease involving the bifurcation of OM1 and OM 2 with 99% in-stent restenosis in the bare-metal stent placed in the OM 2.   LEFT HEART CATHETERIZATION WITH CORONARY ANGIOGRAM N/A 01/20/2012   Procedure: LEFT HEART CATHETERIZATION WITH CORONARY ANGIOGRAM;  Surgeon: Corky Crafts, MD;   Location: [facility] CATH LAB;  Service: Cardiovascular;  Laterality: N/A;   LEFT HEART CATHETERIZATION WITH CORONARY ANGIOGRAM N/A 11/07/2013   Procedure: LEFT HEART CATHETERIZATION WITH CORONARY ANGIOGRAM;  Surgeon: Corky Crafts, MD;  Location: [facility] CATH LAB;  Service: Cardiovascular;  Laterality: N/A;   LEFT HEART CATHETERIZATION WITH CORONARY ANGIOGRAM N/A 08/01/2014   Procedure: LEFT HEART CATHETERIZATION WITH CORONARY ANGIOGRAM;  Surgeon: Marykay Lex, MD;  Location: [facility] CATH LAB;  Service: Cardiovascular;  Laterality: N/A;   MULTIPLE TOOTH EXTRACTIONS     PERCUTANEOUS CORONARY STENT INTERVENTION (PCI-S)  01/20/2012   Procedure: PERCUTANEOUS CORONARY STENT INTERVENTION (PCI-S);  Surgeon: Corky Crafts, MD;  Location: [facility] CATH LAB;  Service: Cardiovascular;;   PERCUTANEOUS CORONARY STENT INTERVENTION (PCI-S)  11/07/2013   Procedure: PERCUTANEOUS CORONARY STENT INTERVENTION (PCI-S);  Surgeon: Corky Crafts, MD;  Location: [facility] CATH LAB;  Service: Cardiovascular;;    Allergies  Allergen Reactions   Shellfish Allergy Nausea And Vomiting    OYSTERS    Review of Systems: Negative except as noted in the HPI.  Objective:  There were no vitals filed for this visit.  [name] is a pleasant 62 y.o. male in NAD. AAO x 3.  Vascular Examination: Capillary refill time is 3-5 seconds to toes bilateral.  Trace palpable pedal pulses b/l LE. Digital hair sparse b/l.  Skin temperature  gradient WNL b/l. No varicosities b/l. No cyanosis noted b/l.   Dermatological Examination: Pedal skin with normal turgor, texture and tone b/l. No open wounds. No interdigital macerations b/l. Toenails x10 are 3mm thick, discolored, dystrophic with subungual debris. There is pain with compression of the nail plates.  They are elongated x1  Musculoskeletal Examination: Muscle strength 5/5 to all LE muscle groups b/l.  Pain on palpation to the right dorsal and plantar midfoot area.  No pinpoint pain is noted in  the specific area.  It is diffuse in nature.  Ankle dorsiflexion is less than 10 degrees with the knee extended.  No ecchymosis is noted     Latest Ref Rng & Units 04/11/2023    7:45 AM 02/14/2023   11:33 AM 11/22/2022    9:50 AM 09/07/2022   10:45 AM  Hemoglobin A1C  Hemoglobin-A1c 4.8 - 5.6 % 8.2  8.1  7.9  8.3      Assessment/Plan: 1. Pain due to onychomycosis of toenails of both feet   2. Capsulitis of ankle, right     Meds ordered this encounter  Medications   meloxicam (MOBIC) 7.5 MG tablet    Sig: Take 1 tablet (7.5 mg total) by mouth daily.    Dispense:  30 tablet    Refill:  0   The mycotic toenails were sharply debrided x10 with sterile nail nippers and a power debriding burr to decrease bulk/thickness and length.    Prescription for meloxicam 7.5 mg was sent to the patient's pharmacy to take daily.  He was given a 30-day supply.  This should help with the discomfort and achiness in the right foot.  Return in about 3 months (around 09/11/2023) for RFC.   Clerance Lav, DPM, FACFAS [facility]     [address] [place], [place] [address]                Office [phone]  Fax [phone]

## 2023-06-20 ENCOUNTER — Ambulatory Visit (INDEPENDENT_AMBULATORY_CARE_PROVIDER_SITE_OTHER): Payer: Medicaid Other | Admitting: Family Medicine

## 2023-06-20 ENCOUNTER — Other Ambulatory Visit: Payer: Self-pay

## 2023-06-20 ENCOUNTER — Encounter: Payer: Self-pay | Admitting: Family Medicine

## 2023-06-20 VITALS — BP 114/84 | HR 65 | Ht 67.0 in | Wt 273.6 lb

## 2023-06-20 DIAGNOSIS — Z23 Encounter for immunization: Secondary | ICD-10-CM

## 2023-06-20 DIAGNOSIS — J449 Chronic obstructive pulmonary disease, unspecified: Secondary | ICD-10-CM | POA: Diagnosis not present

## 2023-06-20 DIAGNOSIS — Z794 Long term (current) use of insulin: Secondary | ICD-10-CM

## 2023-06-20 DIAGNOSIS — E1165 Type 2 diabetes mellitus with hyperglycemia: Secondary | ICD-10-CM | POA: Diagnosis present

## 2023-06-20 MED ORDER — ZOSTER VAC RECOMB ADJUVANTED 50 MCG/0.5ML IM SUSR: INTRAMUSCULAR | 1 refills | Status: DC

## 2023-06-20 NOTE — Assessment & Plan Note (Signed)
Significant hypoglycemia presumably due to his dietary changes.  Will decrease lantus to 15 u from 30 and monitor

## 2023-06-20 NOTE — Progress Notes (Signed)
    SUBJECTIVE:   CHIEF COMPLAINT / HPI:   Diabetes Brings in all his medications.  Reports not taking any lantus for last 3 days because his blood sugars were in the 60s. Continues to take metformin Has been watching his diet and working to lower his blood sugar that way  Pulmonary No respiratory problems.  Uses albuterol once or twice a week.  Has his dulera and uses regularly   OBJECTIVE:   BP 114/84   Pulse 65   Ht 5\' 7"  (1.702 m)   Wt 273 lb 9.6 oz (124.1 kg)   SpO2 96%   BMI 42.85 kg/m   Heart - Regular rate and rhythm.  No murmurs, gallops or rubs.    Lungs:  Normal respiratory effort, chest expands symmetrically. Lungs are clear to auscultation, no crackles or wheezes.   ASSESSMENT/PLAN:   Encounter for immunization -     Pfizer Comirnaty Covid-19 Vaccine 79yrs & older  Type 2 diabetes mellitus with hyperglycemia, with long-term current use of insulin (HCC) Assessment & Plan: Significant hypoglycemia presumably due to his dietary changes.  Will decrease lantus to 15 u from 30 and monitor   COPD, moderate (HCC) Assessment & Plan: No recent flares but history of two recent sudden hospitalizations one requiring intubation. Continue dulera. Has pulmonary appointment soon    Other orders -     Zoster Vac Recomb Adjuvanted; Inject 0.5 ml IM and Repeat in 2 months  Dispense: 0.5 mL; Refill: 1     Patient Instructions  Good to see you today - Thank you for coming in  Things we discussed today:  Diabetes Decrease Lantus to 15 units every day.  If your blood sugar is too low to take then call me  I sent a prescription to your pharmacy for your Zoster vaccines to help prevent Shingles.  The shot may cause a sore arm and mild flu like symptoms for a few days.  You will need a second shot 2 months after your first.  - Take the first one in 2 weeks around 10/22  Please always bring your medication bottles  Come back to see me in before Thanksgivings     Carney Living, MD [facility]

## 2023-06-20 NOTE — Patient Instructions (Addendum)
Good to see you today - Thank you for coming in  Things we discussed today:  Diabetes Decrease Lantus to 15 units every day.  If your blood sugar is too low to take then call me  I sent a prescription to your pharmacy for your Zoster vaccines to help prevent Shingles.  The shot may cause a sore arm and mild flu like symptoms for a few days.  You will need a second shot 2 months after your first.  - Take the first one in 2 weeks around 10/22  Please always bring your medication bottles  Come back to see me in before Thanksgivings

## 2023-06-20 NOTE — Assessment & Plan Note (Signed)
No recent flares but history of two recent sudden hospitalizations one requiring intubation. Continue dulera. Has pulmonary appointment soon

## 2023-06-22 ENCOUNTER — Telehealth: Payer: Self-pay

## 2023-06-22 NOTE — Telephone Encounter (Signed)
Patient calls nurse line in regards to low blood sugar.   He reports his fasting CBG today was 58. He reports before he went to bed last night his CBG was lower than usual, however "I don't remember what it was."   He reports he has not any food or drink today. He has not had insulin today only "my oral medications."  He denies any symptoms at this time. He denies shakiness, sweating, fatigue, nausea, headaches or dizziness.   Patient reports he is about to eat something for the day.   Advised to continue to monitor blood sugars and to schedule a FU apt with PCP.   ED precautions discussed.   He does report he does not have a reliable phone.   Patient to call me back if CBG does not improve.

## 2023-07-05 ENCOUNTER — Telehealth: Payer: Self-pay

## 2023-07-05 NOTE — Telephone Encounter (Signed)
Patient calls nurse line for two reasons.   He reports that he went to get his Shingles vaccine yesterday, however, was told that he did not need it. He was told by pharmacist that he has already received two doses. He will bring this documentation to next appointment.  Reports that fasting blood sugar this morning was 63. Currently asymptomatic. Patient has been taking 15 units of Lantus daily. He wanted to inform provider as he is not going to be taking his Lantus today.  Advised that I would forward message to provider.   Veronda Prude, RN

## 2023-07-06 NOTE — Telephone Encounter (Signed)
Recommend decrease lantus to 14 units each AM  He knows about his appointment with me on 11-6

## 2023-07-07 ENCOUNTER — Ambulatory Visit (INDEPENDENT_AMBULATORY_CARE_PROVIDER_SITE_OTHER): Payer: Medicaid Other | Admitting: Pulmonary Disease

## 2023-07-07 ENCOUNTER — Encounter: Payer: Self-pay | Admitting: Pulmonary Disease

## 2023-07-07 VITALS — BP 120/60 | HR 79 | Ht 67.0 in | Wt 276.8 lb

## 2023-07-07 DIAGNOSIS — G4733 Obstructive sleep apnea (adult) (pediatric): Secondary | ICD-10-CM

## 2023-07-07 DIAGNOSIS — J449 Chronic obstructive pulmonary disease, unspecified: Secondary | ICD-10-CM | POA: Diagnosis not present

## 2023-07-07 NOTE — Progress Notes (Signed)
[name] [name]    [id number]    [birth year]  Primary Care Physician:Chambliss, Estill Batten, MD  Referring Physician: Carney Living, MD [address] [place],  [place] [address]  Chief complaint: Consult for COPD  HPI: 62 y.o. who  has a past medical history of Alcoholism (HCC), Anxiety, Arthritis, Asthma, CAD S/P percutaneous coronary angioplasty (01/18/12; 10/2013), Chronic back pain, COPD (chronic obstructive pulmonary disease) (HCC), Depression, GERD (gastroesophageal reflux disease), Hyperlipidemia, Hypertension, Migraines, Non-ST elevation myocardial infarction (NSTEMI) (HCC) (10/2013), OSA on CPAP (01/19/2012), Sleep apnea, and Type II diabetes mellitus (HCC).   Discussed the use of AI scribe software for clinical note transcription with the patient, who gave verbal consent to proceed.  History of bronchitis and sleep apnea, presents for follow-up of breathing issues. He reports two hospital admissions in June and July 2024 for similar symptoms of COPD exacerbation. 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 In July 2024 he was admitted for 4 days at [facility] with a negative CT angiogram.  He was treated with bronchodilator therapy, IV steroids, inhalers, physical therapy with improvement. He denies current smoking but has a 35-40 year history of 10-20 cigarettes per day, having quit in 2020. He also reports a sensitivity to heat, which exacerbates his breathing issues. He denies any family history of lung disease. He uses Dulera and Albuterol inhalers daily for symptom management. He also has a CPAP machine for sleep apnea, which he uses nightly.   Pets: Dog Occupation: On [employer] Exposures: No mold, hot tub, Jacuzzi.  No feather pillow or comforter Smoking history: 42-pack-year smoker.  Quit in 2022 Travel history:Originally from [place].  No significant recent travel Relevant family history: No family history of  lung disease   Outpatient Encounter Medications as of 07/07/2023  Medication Sig   albuterol (PROAIR HFA) 108 (90 Base) MCG/ACT inhaler INHALE TWO PUFFS BY MOUTH EVERY 6 HOURS AS NEEDED FOR WHEEZING OR FOR SHORTNESS OF BREATH   aspirin 81 MG chewable tablet Chew 1 tablet (81 mg total) by mouth daily.   atorvastatin (LIPITOR) 80 MG tablet TAKE 1 TABLET BY MOUTH DAILY   carvedilol (COREG) 3.125 MG tablet TAKE 1 TABLET BY MOUTH TWICE A DAY WITH A MEAL   CIPRODEX OTIC suspension Place 4 drops into both ears 2 (two) times daily.   citalopram (CELEXA) 20 MG tablet TAKE 1 TABLET BY MOUTH DAILY   Continuous Glucose Sensor (FREESTYLE LIBRE 2 SENSOR) MISC APPLY SENSOR TO SKIN FOR CONTINUOUS BLOOD SUGAR MONITORING, REPLACE EVERY 14 DAYS   DROPLET PEN NEEDLES 29G X MISC USE ONCE DAILY WITH VICTOZA PEN   gentamicin cream (GARAMYCIN) 0.1 % Apply to right foot once daily. (Patient taking differently: Apply 1 Application topically daily. Apply to right foot once daily.)   glucose blood (ACCU-CHEK GUIDE) test strip Use as instructed   insulin glargine (LANTUS SOLOSTAR) 100 UNIT/ML Solostar Pen INJECT 15 UNITS UNDER THE SKIN DAILY   Insulin Pen Needle (PEN NEEDLES) 32G X 4 MM MISC Use as directed with insulin four times daily   JARDIANCE 25 MG TABS tablet TAKE 1 TABLET BY MOUTH DAILY   lisinopril (ZESTRIL) 2.5 MG tablet TAKE 1 TABLET BY MOUTH TWICE A DAY   Melatonin 2.5 MG CAPS Take 5 mg by mouth at bedtime.   meloxicam (MOBIC) 7.5 MG tablet Take 1 tablet (7.5 mg total) by mouth daily.   metFORMIN (GLUCOPHAGE) 1000 MG  tablet TAKE ONE TABLET BY MOUTH TWICE A DAY WITH MEALS   mometasone-formoterol (DULERA) 200-5 MCG/ACT AERO Inhale 2 puffs into the lungs in the morning and at bedtime.   Multiple Vitamin (MULTIVITAMIN) tablet Take 1 tablet by mouth daily.   nitroGLYCERIN (NITROSTAT) 0.4 MG SL tablet DISSOLVE 1 TABLET UNDER THE TONGUE FOR CHEST PAIN. IF PAIN REMAINS AFTER 5 MINUTES, CALL 911 AND REPEAT DOSE.  MAX 3 TABLETS IN 15 MINUTES (Patient taking differently: Place 0.4 mg under the tongue every 5 (five) minutes as needed for chest pain.)   Omega-3 Fatty Acids (FISH OIL) 1000 MG CAPS Take 1 capsule (1,000 mg total) by mouth daily.   prasugrel (EFFIENT) 10 MG TABS tablet Take 1 tablet (10 mg total) by mouth daily.   Semaglutide, 2 MG/DOSE, (OZEMPIC, 2 MG/DOSE,) 8 MG/3ML SOPN Inject 2 mg into the skin once a week. (Patient taking differently: Inject 2 mg into the skin once a week. Monday)   traMADol (ULTRAM) 50 MG tablet TAKE 1 TABLET BY MOUTH EVERY 6 HOURS AS NEEDED   Zoster Vaccine Adjuvanted [facility]) injection Inject 0.5 ml IM and Repeat in 2 months   No facility-administered encounter medications on file as of 07/07/2023.    Allergies as of 07/07/2023 - Review Complete 07/07/2023  Allergen Reaction Noted   Shellfish allergy Nausea And Vomiting 04/15/2013    Past Medical History:  Diagnosis Date   Alcoholism [facility])    Anxiety    Arthritis    "qwhere"   Asthma    CAD S/P percutaneous coronary angioplasty 01/18/12; 10/2013   a. pRCA 3.5 x 18 vision BMS - 4.2 mm; b. 2/'15: mCx 3.5 x 12  Rebel BMS (3.6-3.7 mm)   Chronic back pain    "mid/lower" (08/01/2014)   COPD (chronic obstructive pulmonary disease) (HCC)    "I'm seeing COPD dr now; don't know if I've got it" (08/01/2014)   Depression    GERD (gastroesophageal reflux disease)    Hyperlipidemia    Hypertension    Migraines    "once in awhile" (08/01/2014)   Non-ST elevation myocardial infarction (NSTEMI) (HCC) 10/2013   OSA on CPAP 01/19/2012   Sleep apnea    Type II diabetes mellitus (HCC)     Past Surgical History:  Procedure Laterality Date   CARDIAC CATHETERIZATION  07/2014   Left Main: Short, large-caliber vessel. Widely patent. Bifurcates into the LAD and Circumflex. Angiographically normal.   CORONARY ANGIOPLASTY WITH STENT PLACEMENT  01/2012; 11/07/2013; 08/01/2014   "1 + 2 + 1"    CORONARY ANGIOPLASTY WITH STENT  PLACEMENT  07/2014   Severe single-vessel disease involving the bifurcation of OM1 and OM 2 with 99% in-stent restenosis in the bare-metal stent placed in the OM 2.   LEFT HEART CATHETERIZATION WITH CORONARY ANGIOGRAM N/A 01/20/2012   Procedure: LEFT HEART CATHETERIZATION WITH CORONARY ANGIOGRAM;  Surgeon: Corky Crafts, MD;  Location: [facility] CATH LAB;  Service: Cardiovascular;  Laterality: N/A;   LEFT HEART CATHETERIZATION WITH CORONARY ANGIOGRAM N/A 11/07/2013   Procedure: LEFT HEART CATHETERIZATION WITH CORONARY ANGIOGRAM;  Surgeon: Corky Crafts, MD;  Location: [facility] CATH LAB;  Service: Cardiovascular;  Laterality: N/A;   LEFT HEART CATHETERIZATION WITH CORONARY ANGIOGRAM N/A 08/01/2014   Procedure: LEFT HEART CATHETERIZATION WITH CORONARY ANGIOGRAM;  Surgeon: Marykay Lex, MD;  Location: [facility] CATH LAB;  Service: Cardiovascular;  Laterality: N/A;   MULTIPLE TOOTH EXTRACTIONS     PERCUTANEOUS CORONARY STENT INTERVENTION (PCI-S)  01/20/2012   Procedure: PERCUTANEOUS CORONARY STENT  INTERVENTION (PCI-S);  Surgeon: Corky Crafts, MD;  Location: [facility] CATH LAB;  Service: Cardiovascular;;   PERCUTANEOUS CORONARY STENT INTERVENTION (PCI-S)  11/07/2013   Procedure: PERCUTANEOUS CORONARY STENT INTERVENTION (PCI-S);  Surgeon: Corky Crafts, MD;  Location: [facility] CATH LAB;  Service: Cardiovascular;;    Family History  Problem Relation Age of Onset   Breast cancer Mother    Heart attack Father 22   Hypertension Sister    Hyperlipidemia Sister    Hyperlipidemia Sister    Hypertension Sister    Diabetes Sister    Heart attack Brother    Stroke Brother    Diabetes Brother    Colon cancer Neg Hx    Esophageal cancer Neg Hx    Rectal cancer Neg Hx    Stomach cancer Neg Hx     Social History   Socioeconomic History   Marital status: Single    Spouse name: Not on file   Number of children: 0   Years of education: 15   Highest education level: Not on file  Occupational History    Occupation: Unemployed    Comment: Used to work for [employer]   Occupation: Consulting civil engineer, Museum/gallery curator  Tobacco Use   Smoking status: Former    Current packs/day: 0.00    Average packs/day: 1 pack/day for 42.4 years (42.4 ttl pk-yrs)    Types: Cigarettes    Start date: 09/13/1975    Quit date: 01/25/2018    Years since quitting: 5.4   Smokeless tobacco: Former    Types: Chew   Tobacco comments:    Bupropion helping  Vaping Use   Vaping status: Never Used  Substance and Sexual Activity   Alcohol use: Not Currently    Alcohol/week: 6.0 standard drinks of alcohol    Types: 6 Cans of beer per week    Comment: pt reports "I quit 66 days ago" (05/09/16)   Drug use: No    Comment: Crack Cocaine (denies 05/09/16)   Sexual activity: Not Currently  Other Topics Concern   Not on file  Social History Narrative   Single.  Lives with a roommate.  Ambulates independently.   Social Determinants of Health   Financial Resource Strain: Not on file  Food Insecurity: No Food Insecurity (04/11/2023)   Hunger Vital Sign    Worried About Running Out of Food in the Last Year: Never true    Ran Out of Food in the Last Year: Never true  Transportation Needs: No Transportation Needs (04/11/2023)   PRAPARE - Administrator, Civil Service (Medical): No    Lack of Transportation (Non-Medical): No  Physical Activity: Not on file  Stress: Not on file  Social Connections: Not on file  Intimate Partner Violence: Not At Risk (04/11/2023)   Humiliation, Afraid, Rape, and Kick questionnaire    Fear of Current or Ex-Partner: No    Emotionally Abused: No    Physically Abused: No    Sexually Abused: No    Review of systems: Review of Systems  Constitutional: Negative for fever and chills.  HENT: Negative.   Eyes: Negative for blurred vision.  Respiratory: as per HPI  Cardiovascular: Negative for chest pain and palpitations.  Gastrointestinal: Negative for vomiting, diarrhea, blood  per rectum. Genitourinary: Negative for dysuria, urgency, frequency and hematuria.  Musculoskeletal: Negative for myalgias, back pain and joint pain.  Skin: Negative for itching and rash.  Neurological: Negative for dizziness, tremors, focal weakness, seizures and loss of consciousness.  Endo/Heme/Allergies: Negative for environmental allergies.  Psychiatric/Behavioral: Negative for depression, suicidal ideas and hallucinations.  All other systems reviewed and are negative.  Physical Exam: Blood pressure 120/60, pulse 79, height 5\' 7"  (1.702 m), weight 276 lb 12.8 oz (125.6 kg), SpO2 94%. Gen:      No acute distress HEENT:  EOMI, sclera anicteric Neck:     No masses; no thyromegaly Lungs:    Clear to auscultation bilaterally; normal respiratory effort CV:         Regular rate and rhythm; no murmurs Abd:      + bowel sounds; soft, non-tender; no palpable masses, no distension Ext:    No edema; adequate peripheral perfusion Skin:      Warm and dry; no rash Neuro: alert and oriented x 3 Psych: normal mood and affect  Data Reviewed: Imaging: CTA 04/11/2023-no pulmonary embolism, centrilobular thickening.  Stable right middle lobe perifissural nodule I have reviewed the images personally.  PFTs:  Labs: WBC 13.3, hemoglobin 14.1, platelets 269, eos 0%  Assessment and Plan Chronic Bronchitis, COPD History of hospitalizations in June and July for breathing issues. Currently using Dulera and Albuterol inhalers. Lungs sound clear on examination. -Continue Dulera twice daily. -Use Albuterol as needed for additional respiratory support. -Return in six months for a lung function test.  Former smoker -On lung cancer screening protocol per his primary care.  Sleep Apnea Using CPAP machine nightly for the past ten years. -Continue using CPAP machine nightly.  General Health Maintenance -Return in April for follow-up appointment.   Recommendations: Continue Dulera Albuterol as  needed CPAP for OSA PFTs  Chilton Greathouse MD [facility] 07/07/2023, 4:09 PM  CC: Carney Living, *

## 2023-07-07 NOTE — Patient Instructions (Signed)
VISIT SUMMARY:  [name] [name], during your visit today, we discussed your ongoing breathing issues and reviewed your recent hospitalizations. We also talked about your current treatment plan for chronic bronchitis and sleep apnea. Your lungs sounded clear during the examination, and we have outlined a plan to manage your symptoms effectively.  YOUR PLAN:  -CHRONIC BRONCHITIS: Chronic bronchitis is a long-term inflammation of the airways in the lungs, often caused by smoking. You should continue using your Dulera inhaler twice daily and use your Albuterol inhaler as needed for additional respiratory support. We will conduct a lung function test in six months to monitor your condition.  -SLEEP APNEA: Sleep apnea is a condition where breathing repeatedly stops and starts during sleep. You should continue using your CPAP machine every night as you have been doing for the past ten years.  -GENERAL HEALTH MAINTENANCE: For your overall health, please return in April for a follow-up appointment to review your progress and any routine labs or studies.  INSTRUCTIONS:  Please return in six months for a lung function test and in April for a follow-up appointment.

## 2023-07-17 ENCOUNTER — Other Ambulatory Visit: Payer: Self-pay | Admitting: Family Medicine

## 2023-07-17 DIAGNOSIS — F32A Depression, unspecified: Secondary | ICD-10-CM

## 2023-07-19 ENCOUNTER — Encounter: Payer: Self-pay | Admitting: Family Medicine

## 2023-07-19 ENCOUNTER — Emergency Department (HOSPITAL_COMMUNITY)
Admission: EM | Admit: 2023-07-19 | Discharge: 2023-07-20 | Disposition: A | Discharge status: Home / Self Care | Payer: Medicaid Other | Attending: Emergency Medicine | Admitting: Emergency Medicine

## 2023-07-19 ENCOUNTER — Other Ambulatory Visit: Payer: Self-pay

## 2023-07-19 ENCOUNTER — Emergency Department (HOSPITAL_COMMUNITY)
Admission: EM | Admit: 2023-07-19 | Discharge: 2023-07-19 | Disposition: A | Discharge status: Home / Self Care | Payer: Medicaid Other | Attending: Emergency Medicine | Admitting: Emergency Medicine

## 2023-07-19 ENCOUNTER — Encounter (HOSPITAL_COMMUNITY): Payer: Self-pay

## 2023-07-19 ENCOUNTER — Emergency Department (HOSPITAL_COMMUNITY): Payer: Medicaid Other

## 2023-07-19 ENCOUNTER — Ambulatory Visit: Payer: Medicaid Other | Admitting: Family Medicine

## 2023-07-19 VITALS — BP 115/68 | HR 86 | Ht 67.0 in | Wt 264.0 lb

## 2023-07-19 DIAGNOSIS — R0602 Shortness of breath: Secondary | ICD-10-CM | POA: Insufficient documentation

## 2023-07-19 DIAGNOSIS — E1165 Type 2 diabetes mellitus with hyperglycemia: Secondary | ICD-10-CM | POA: Diagnosis present

## 2023-07-19 DIAGNOSIS — J45909 Unspecified asthma, uncomplicated: Secondary | ICD-10-CM | POA: Insufficient documentation

## 2023-07-19 DIAGNOSIS — I10 Essential (primary) hypertension: Secondary | ICD-10-CM | POA: Insufficient documentation

## 2023-07-19 DIAGNOSIS — Z794 Long term (current) use of insulin: Secondary | ICD-10-CM | POA: Diagnosis not present

## 2023-07-19 DIAGNOSIS — J449 Chronic obstructive pulmonary disease, unspecified: Secondary | ICD-10-CM | POA: Insufficient documentation

## 2023-07-19 DIAGNOSIS — Z7982 Long term (current) use of aspirin: Secondary | ICD-10-CM | POA: Diagnosis not present

## 2023-07-19 DIAGNOSIS — F32A Depression, unspecified: Secondary | ICD-10-CM

## 2023-07-19 DIAGNOSIS — Z87891 Personal history of nicotine dependence: Secondary | ICD-10-CM | POA: Insufficient documentation

## 2023-07-19 DIAGNOSIS — R0600 Dyspnea, unspecified: Secondary | ICD-10-CM | POA: Insufficient documentation

## 2023-07-19 DIAGNOSIS — F339 Major depressive disorder, recurrent, unspecified: Secondary | ICD-10-CM | POA: Diagnosis not present

## 2023-07-19 DIAGNOSIS — I2510 Atherosclerotic heart disease of native coronary artery without angina pectoris: Secondary | ICD-10-CM | POA: Insufficient documentation

## 2023-07-19 DIAGNOSIS — J441 Chronic obstructive pulmonary disease with (acute) exacerbation: Secondary | ICD-10-CM | POA: Diagnosis not present

## 2023-07-19 LAB — CBC WITH DIFFERENTIAL/PLATELET
Abs Immature Granulocytes: 0.04 10*3/uL (ref 0.00–0.07)
Basophils Absolute: 0.1 10*3/uL (ref 0.0–0.1)
Basophils Relative: 1 %
Eosinophils Absolute: 0.4 10*3/uL (ref 0.0–0.5)
Eosinophils Relative: 4 %
HCT: 43 % (ref 39.0–52.0)
Hemoglobin: 14.1 g/dL (ref 13.0–17.0)
Immature Granulocytes: 0 %
Lymphocytes Relative: 27 %
Lymphs Abs: 2.7 10*3/uL (ref 0.7–4.0)
MCH: 28.9 pg (ref 26.0–34.0)
MCHC: 32.8 g/dL (ref 30.0–36.0)
MCV: 88.1 fL (ref 80.0–100.0)
Monocytes Absolute: 0.6 10*3/uL (ref 0.1–1.0)
Monocytes Relative: 6 %
Neutro Abs: 6.2 10*3/uL (ref 1.7–7.7)
Neutrophils Relative %: 62 %
Platelets: 276 10*3/uL (ref 150–400)
RBC: 4.88 MIL/uL (ref 4.22–5.81)
RDW: 14.1 % (ref 11.5–15.5)
WBC: 10.1 10*3/uL (ref 4.0–10.5)
nRBC: 0 % (ref 0.0–0.2)

## 2023-07-19 LAB — BASIC METABOLIC PANEL
Anion gap: 9 (ref 5–15)
BUN: 14 mg/dL (ref 8–23)
CO2: 24 mmol/L (ref 22–32)
Calcium: 9.2 mg/dL (ref 8.9–10.3)
Chloride: 105 mmol/L (ref 98–111)
Creatinine, Ser: 0.68 mg/dL (ref 0.61–1.24)
GFR, Estimated: >60 mL/min (ref >60)
Glucose, Bld: 203 mg/dL — ABNORMAL HIGH (ref 70–99)
Potassium: 4 mmol/L (ref 3.5–5.1)
Sodium: 138 mmol/L (ref 135–145)

## 2023-07-19 LAB — POCT GLYCOSYLATED HEMOGLOBIN (HGB A1C): HbA1c, POC (controlled diabetic range): 7.9 % — AB (ref 0.0–7.0)

## 2023-07-19 MED ORDER — IPRATROPIUM-ALBUTEROL 0.5-2.5 (3) MG/3ML IN SOLN
3.0000 mL | Freq: Once | RESPIRATORY_TRACT | Status: AC
  Administered 2023-07-19: 3 mL via RESPIRATORY_TRACT
  Filled 2023-07-19: qty 3

## 2023-07-19 MED ORDER — ALBUTEROL SULFATE HFA 108 (90 BASE) MCG/ACT IN AERS
1.0000 | INHALATION_SPRAY | Freq: Four times a day (QID) | RESPIRATORY_TRACT | 0 refills | Status: DC | PRN
Start: 2023-07-19 — End: 2023-09-01

## 2023-07-19 MED ORDER — PREDNISONE 10 MG PO TABS: 40.0000 mg | ORAL_TABLET | Freq: Every day | ORAL | 0 refills | Status: DC

## 2023-07-19 MED ORDER — CITALOPRAM HYDROBROMIDE 20 MG PO TABS
20.0000 mg | ORAL_TABLET | Freq: Every day | ORAL | 1 refills | Status: DC
Start: 2023-07-19 — End: 2024-05-20

## 2023-07-19 NOTE — ED Notes (Signed)
Pt has been discharged and transportation is a concern at this time.

## 2023-07-19 NOTE — Patient Instructions (Signed)
Good to see you today - Thank you for coming in  Things we discussed today:  Diabetes Decrease lantus to 12 units once a day Call me if your blood sugar are less than 90 regularly  Breathing Use the Albuterol 2 puffs every 6 hours as needed Take the dulera every day If sudden worsening and albuterol does not help go to the ER    Weight You are doing well Keep taking the ozempic weekly   Come back for a diabetes check in 3 months

## 2023-07-19 NOTE — Discharge Instructions (Signed)
Return for any problem.  ?

## 2023-07-19 NOTE — ED Triage Notes (Signed)
Pt arrived from home via [facility] c/o SOB. Per EMS on arrival O2 79% after simple mask and  4LPM 96-100% at rest.  Pt noted to have very shallow resp in triage and is tachypneic

## 2023-07-19 NOTE — ED Provider Notes (Signed)
[facility] EMERGENCY DEPARTMENT AT [facility] Provider Note   CSN: [id number] Arrival date & time: 07/19/23  1539     History  Chief Complaint  Patient presents with   Shortness of Breath    [name] [name] is a 62 y.o. male.  62 year old male with prior medical history as detailed below presents for evaluation.  Patient arrives from home with EMS transport.  Patient reports acute onset of wheezing and shortness of breath just prior to calling 911.  Patient was given 125 mg of Solu-Medrol, 1 mg Atrovent, 50 mg of albuterol during transport.  Patient feels much improved upon arrival.  Patient reports that he uses inhalers at home and he also has to use a CPAP machine at night.  He is otherwise without need for supplemental O2. He denies associated fever, chills, cough, abdominal pain.  The history is provided by the patient and medical records.       Home Medications Prior to Admission medications   Medication Sig Start Date End Date Taking? Authorizing Provider  albuterol (PROAIR HFA) 108 (90 Base) MCG/ACT inhaler INHALE TWO PUFFS BY MOUTH EVERY 6 HOURS AS NEEDED FOR WHEEZING OR FOR SHORTNESS OF BREATH 04/25/23   Carney Living, MD  aspirin 81 MG chewable tablet Chew 1 tablet (81 mg total) by mouth daily. 10/04/13   [name], MD  atorvastatin (LIPITOR) 80 MG tablet TAKE 1 TABLET BY MOUTH DAILY 04/25/23   Carney Living, MD  carvedilol (COREG) 3.125 MG tablet TAKE 1 TABLET BY MOUTH TWICE A DAY WITH A MEAL 05/22/23   Chambliss, Estill Batten, MD  CIPRODEX OTIC suspension Place 4 drops into both ears 2 (two) times daily. 10/07/21   [provider]  citalopram (CELEXA) 20 MG tablet Take 1 tablet (20 mg total) by mouth daily. 07/19/23   Carney Living, MD  Continuous Glucose Sensor (FREESTYLE LIBRE 2 SENSOR) MISC APPLY SENSOR TO SKIN FOR CONTINUOUS BLOOD SUGAR MONITORING, REPLACE EVERY 14 DAYS 05/29/23   Carney Living, MD  DROPLET PEN NEEDLES  29G X MISC USE ONCE DAILY WITH VICTOZA PEN 03/28/23   Westley Chandler, MD  gentamicin cream (GARAMYCIN) 0.1 % Apply to right foot once daily. Patient taking differently: Apply 1 Application topically daily. Apply to right foot once daily. 08/30/22   Freddie Breech, DPM  glucose blood (ACCU-CHEK GUIDE) test strip Use as instructed 10/11/21   Carney Living, MD  insulin glargine (LANTUS SOLOSTAR) 100 UNIT/ML Solostar Pen Inject 12 Units into the skin daily. 06/20/23   Carney Living, MD  Insulin Pen Needle (PEN NEEDLES) 32G X 4 MM MISC Use as directed with insulin four times daily 09/15/21   Carney Living, MD  JARDIANCE 25 MG TABS tablet TAKE 1 TABLET BY MOUTH DAILY 05/22/23   Carney Living, MD  lisinopril (ZESTRIL) 2.5 MG tablet TAKE 1 TABLET BY MOUTH TWICE A DAY 08/17/22   Carney Living, MD  Melatonin 2.5 MG CAPS Take 5 mg by mouth at bedtime.    [provider]  meloxicam (MOBIC) 7.5 MG tablet Take 1 tablet (7.5 mg total) by mouth daily. Patient not taking: Reported on 07/19/2023 06/12/23   McCaughan, Dia D, DPM  metFORMIN (GLUCOPHAGE) 1000 MG tablet TAKE ONE TABLET BY MOUTH TWICE A DAY WITH MEALS 01/25/23   Chambliss, Estill Batten, MD  mometasone-formoterol (DULERA) 200-5 MCG/ACT AERO Inhale 2 puffs into the lungs in the morning and at bedtime. 05/16/23  Carney Living, MD  Multiple Vitamin (MULTIVITAMIN) tablet Take 1 tablet by mouth daily.    [provider]  nitroGLYCERIN (NITROSTAT) 0.4 MG SL tablet DISSOLVE 1 TABLET UNDER THE TONGUE FOR CHEST PAIN. IF PAIN REMAINS AFTER 5 MINUTES, CALL 911 AND REPEAT DOSE. MAX 3 TABLETS IN 15 MINUTES Patient taking differently: Place 0.4 mg under the tongue every 5 (five) minutes as needed for chest pain. 11/01/21   Carney Living, MD  Omega-3 Fatty Acids (FISH OIL) 1000 MG CAPS Take 1 capsule (1,000 mg total) by mouth daily. 08/01/19   Moses Manners, MD  prasugrel (EFFIENT) 10 MG TABS  tablet Take 1 tablet (10 mg total) by mouth daily. 10/17/22   [place], Zaharah Amir M, MD  Semaglutide, 2 MG/DOSE, (OZEMPIC, 2 MG/DOSE,) 8 MG/3ML SOPN Inject 2 mg into the skin once a week. Patient taking differently: Inject 2 mg into the skin once a week. Monday 11/22/22   Carney Living, MD  traMADol (ULTRAM) 50 MG tablet TAKE 1 TABLET BY MOUTH EVERY 6 HOURS AS NEEDED Patient not taking: Reported on 07/19/2023 01/26/23   Carney Living, MD      Allergies    Shellfish allergy    Review of Systems   Review of Systems  All other systems reviewed and are negative.   Physical Exam Updated Vital Signs BP (!) 146/68   Pulse 94   Temp 98 F (36.7 C) (Oral)   Resp (!) 25   Ht 5\' 7"  (1.702 m)   Wt 120.2 kg   SpO2 93%   BMI 41.50 kg/m  Physical Exam Vitals and nursing note reviewed.  Constitutional:      General: He is not in acute distress.    Appearance: Normal appearance. He is well-developed.  HENT:     Head: Normocephalic and atraumatic.  Eyes:     Conjunctiva/sclera: Conjunctivae normal.     Pupils: Pupils are equal, round, and reactive to light.  Cardiovascular:     Rate and Rhythm: Normal rate and regular rhythm.     Heart sounds: Normal heart sounds.  Pulmonary:     Effort: Pulmonary effort is normal. No respiratory distress.     Comments: Trace minimal expiratory wheezes bilaterally. Abdominal:     General: There is no distension.     Palpations: Abdomen is soft.     Tenderness: There is no abdominal tenderness.  Musculoskeletal:        General: No deformity. Normal range of motion.     Cervical back: Normal range of motion and neck supple.  Skin:    General: Skin is warm and dry.  Neurological:     General: No focal deficit present.     Mental Status: He is alert and oriented to person, place, and time.     ED Results / Procedures / Treatments   Labs (all labs ordered are listed, but only abnormal results are displayed) Labs Reviewed  CBC WITH  DIFFERENTIAL/PLATELET  BASIC METABOLIC PANEL    EKG EKG Interpretation Date/Time:  Wednesday July 19 2023 16:08:25 EST Ventricular Rate:  85 PR Interval:  190 QRS Duration:  87 QT Interval:  392 QTC Calculation: 467 R Axis:   -50  Text Interpretation: Sinus rhythm Left anterior fascicular block Confirmed by Kristine Royal [phone]) on 07/19/2023 4:10:35 PM  Radiology No results found.  Procedures Procedures    Medications Ordered in ED Medications - No data to display  ED Course/ Medical Decision Making/ A&P  Medical Decision Making Amount and/or Complexity of Data Reviewed Labs: ordered. Radiology: ordered.  Risk Prescription drug management.    Medical Screen Complete  This patient presented to the ED with complaint of COPD exacerbation.  This complaint involves an extensive number of treatment options. The initial differential diagnosis includes, but is not limited to, COPD exacerbation  This presentation is: Acute, Chronic, Self-Limited, Previously Undiagnosed, Uncertain Prognosis, Complicated, Systemic Symptoms, and Threat to Life/Bodily Function  Patient is presenting from home with EMS transport.  Patient reports increased shortness of breath and wheezing.  Patient given medications and treatment with EMS during transport.  On arrival the patient is much improved  Notably, patient's baseline pulse ox on room air appears to be in the 93 to 95% range.  Patient appears to be significantly improved on multiple reevaluations throughout the course of ED evaluation.  Patient does not appear to have significant respiratory distress.  Patient is able to talk loudly and with great volume to all providers (RN and MD).  Patient has eaten several sandwiches and had several drinks during his ED evaluation.  Patient's screening labs and imaging is without evidence of significant abnormality.  Patient without significant acute hypoxia or  evidence of other acute pathology requiring admission.  Patient is appropriate for discharge.  Importance of close follow-up stressed.  Strict turn precautions given and understood.  Additional history obtained:  External records from outside sources obtained and reviewed including prior ED visits and prior Inpatient records.    Lab Tests:  I ordered and personally interpreted labs.  The pertinent results include: CBC, BMP   Imaging Studies ordered:  I ordered imaging studies including chest x-ray I independently visualized and interpreted obtained imaging which showed NAD I agree with the radiologist interpretation.   Cardiac Monitoring:  The patient was maintained on a cardiac monitor.  I personally viewed and interpreted the cardiac monitor which showed an underlying rhythm of: NSR   Medicines ordered:  I ordered medication including duoneb  for bronchospasm  Reevaluation of the patient after these medicines showed that the patient: improved    Problem List / ED Course:  COPD exacerbation   Reevaluation:  After the interventions noted above, I reevaluated the patient and found that they have: improved   Disposition:  After consideration of the diagnostic results and the patients response to treatment, I feel that the patent would benefit from close outpatient followup.          Final Clinical Impression(s) / ED Diagnoses Final diagnoses:  Dyspnea, unspecified type  COPD exacerbation (HCC)    Rx / DC Orders ED Discharge Orders          Ordered    predniSONE (DELTASONE) 10 MG tablet  Daily        07/19/23 2153    albuterol (VENTOLIN HFA) 108 (90 Base) MCG/ACT inhaler  Every 6 hours PRN        07/19/23 2153              Wynetta Fines, MD 07/19/23 2157

## 2023-07-19 NOTE — Progress Notes (Unsigned)
    SUBJECTIVE:   CHIEF COMPLAINT / HPI:   Diabetes Had low blood sugar in 50 around Oct 24 on his device He called and we lowered Lantus to 14 u every morning Since his blood sugar are in the 100s at lowest   Obesity Has lost about 10 lbs over last 3 weeks   Breathing  Was initially feeling shortness of breath when he arrived today.  He took his albuterol and felt better.   Has both MDIs and reports using regularly.    Depression He feels he is doing well but would like to continue his celexa   OBJECTIVE:   BP 115/68   Pulse 86   Ht 5\' 7"  (1.702 m)   Wt 264 lb (119.7 kg)   SpO2 97%   BMI 41.35 kg/m   Lungs - had expiratory wheeze mild moderate that improved after albuterol Was ambulating normally when left visit Heart - Regular rate and rhythm.  No murmurs, gallops or rubs.    Extremities:  No cyanosis, edema, or deformity noted with good range of motion of all major joints.     ASSESSMENT/PLAN:   Type 2 diabetes mellitus with hyperglycemia, with long-term current use of insulin (HCC) Assessment & Plan: Improved with resolution of hypoglycemic episodes by lowering his lantus.  Will continue to lower slightly to 12 u daily as his GLP1 seems to be helping with appetite and weight   Orders: -     POCT glycosylated hemoglobin (Hb A1C)  Depression, acute -     Citalopram Hydrobromide; Take 1 tablet (20 mg total) by mouth daily.  Dispense: 90 tablet; Refill: 1  COPD, moderate (HCC) Assessment & Plan: Transient mild shortness of breath and wheeze today that improved with MDI.  He established with pulmonary and has LFTs scheduled.  Continue current medications.  Recommend if continues to have intermittent episodes of shortness of breath he should follow up with [place] or pulmonary    Depression, recurrent (HCC) Assessment & Plan: Stable.  He would like to continue his celexa.  Consider weaning in future      Patient Instructions  Good to see you today - Thank you for  coming in  Things we discussed today:  Diabetes Decrease lantus to 12 units once a day Call me if your blood sugar are less than 90 regularly  Breathing Use the Albuterol 2 puffs every 6 hours as needed Take the dulera every day If sudden worsening and albuterol does not help go to the ER    Weight You are doing well Keep taking the ozempic weekly   Come back for a diabetes check in 3 months    Carney Living, MD [facility]

## 2023-07-19 NOTE — ED Notes (Signed)
Pt ambulated to restroom 93% and 90%

## 2023-07-19 NOTE — ED Notes (Signed)
Pt was escorted by [facility].

## 2023-07-19 NOTE — ED Triage Notes (Addendum)
Patient BIB [facility] from home. Has been short of breath since this morning. History of COPD. Used his inhaler without relief. Wheezing. Short of breath at rest.  EMS 20G left AC 125mg  solumedrol 1mg  atrovent 15 albuterol

## 2023-07-20 ENCOUNTER — Encounter (HOSPITAL_COMMUNITY): Payer: Self-pay

## 2023-07-20 ENCOUNTER — Emergency Department (HOSPITAL_COMMUNITY): Payer: Medicaid Other

## 2023-07-20 LAB — BASIC METABOLIC PANEL
Anion gap: 17 — ABNORMAL HIGH (ref 5–15)
BUN: 17 mg/dL (ref 8–23)
CO2: 21 mmol/L — ABNORMAL LOW (ref 22–32)
Calcium: 10 mg/dL (ref 8.9–10.3)
Chloride: 102 mmol/L (ref 98–111)
Creatinine, Ser: 0.88 mg/dL (ref 0.61–1.24)
GFR, Estimated: >60 mL/min (ref >60)
Glucose, Bld: 290 mg/dL — ABNORMAL HIGH (ref 70–99)
Potassium: 4.3 mmol/L (ref 3.5–5.1)
Sodium: 140 mmol/L (ref 135–145)

## 2023-07-20 LAB — CBC
HCT: 45.9 % (ref 39.0–52.0)
Hemoglobin: 15 g/dL (ref 13.0–17.0)
MCH: 28.4 pg (ref 26.0–34.0)
MCHC: 32.7 g/dL (ref 30.0–36.0)
MCV: 86.8 fL (ref 80.0–100.0)
Platelets: 283 10*3/uL (ref 150–400)
RBC: 5.29 MIL/uL (ref 4.22–5.81)
RDW: 13.9 % (ref 11.5–15.5)
WBC: 12.9 10*3/uL — ABNORMAL HIGH (ref 4.0–10.5)
nRBC: 0 % (ref 0.0–0.2)

## 2023-07-20 LAB — CBG MONITORING, ED: Glucose-Capillary: 284 mg/dL — ABNORMAL HIGH (ref 70–99)

## 2023-07-20 LAB — TROPONIN I (HIGH SENSITIVITY): Troponin I (High Sensitivity): 6 ng/L (ref <18)

## 2023-07-20 MED ORDER — INSULIN GLARGINE-YFGN 100 UNIT/ML ~~LOC~~ SOLN
12.0000 [IU] | Freq: Once | SUBCUTANEOUS | Status: AC
  Administered 2023-07-20: 12 [IU] via SUBCUTANEOUS
  Filled 2023-07-20: qty 0.12

## 2023-07-20 MED ORDER — ATORVASTATIN CALCIUM 40 MG PO TABS
80.0000 mg | ORAL_TABLET | Freq: Once | ORAL | Status: AC
  Administered 2023-07-20: 80 mg via ORAL
  Filled 2023-07-20: qty 2

## 2023-07-20 MED ORDER — METFORMIN HCL 500 MG PO TABS
1000.0000 mg | ORAL_TABLET | Freq: Once | ORAL | Status: AC
  Administered 2023-07-20: 1000 mg via ORAL
  Filled 2023-07-20: qty 2

## 2023-07-20 MED ORDER — IPRATROPIUM-ALBUTEROL 0.5-2.5 (3) MG/3ML IN SOLN
3.0000 mL | Freq: Once | RESPIRATORY_TRACT | Status: AC
  Administered 2023-07-20: 3 mL via RESPIRATORY_TRACT
  Filled 2023-07-20: qty 3

## 2023-07-20 MED ORDER — LISINOPRIL 2.5 MG PO TABS
2.5000 mg | ORAL_TABLET | Freq: Once | ORAL | Status: AC
  Administered 2023-07-20: 2.5 mg via ORAL
  Filled 2023-07-20: qty 1

## 2023-07-20 MED ORDER — CARVEDILOL 3.125 MG PO TABS
3.1250 mg | ORAL_TABLET | Freq: Once | ORAL | Status: AC
  Administered 2023-07-20: 3.125 mg via ORAL
  Filled 2023-07-20: qty 1

## 2023-07-20 NOTE — Discharge Instructions (Signed)
You were evaluated in the Emergency Department and after careful evaluation, we did not find any emergent condition requiring admission or further testing in the hospital.  Your exam/testing today is overall reassuring.  Recommend follow-up with your regular doctors.  Please return to the Emergency Department if you experience any worsening of your condition.   Thank you for allowing [place] to be a part of your care.

## 2023-07-20 NOTE — ED Provider Notes (Signed)
[facility] Emergency Department Provider Note MRN:  [id number]  Arrival date & time: 07/20/23     Chief Complaint   Shortness of Breath   History of Present Illness   Teofil Maniaci is a 62 y.o. year-old male with a history of CAD, COPD presenting to the ED with chief complaint of shortness of breath.  Shortness of breath at home, called EMS.  Was at [facility] emergency department a few hours before this and was discharged.  Denies chest pain at this time, no recent fever or cough.  Review of Systems  A thorough review of systems was obtained and all systems are negative except as noted in the HPI and PMH.   Patient's Health History    Past Medical History:  Diagnosis Date   Alcoholism [facility])    Anxiety    Arthritis    "qwhere"   Asthma    CAD S/P percutaneous coronary angioplasty 01/18/12; 10/2013   a. pRCA 3.5 x 18 vision BMS - 4.2 mm; b. 2/'15: mCx 3.5 x 12  Rebel BMS (3.6-3.7 mm)   Chronic back pain    "mid/lower" (08/01/2014)   COPD (chronic obstructive pulmonary disease) (HCC)    "I'm seeing COPD dr now; don't know if I've got it" (08/01/2014)   Depression    GERD (gastroesophageal reflux disease)    Hyperlipidemia    Hypertension    Migraines    "once in awhile" (08/01/2014)   Non-ST elevation myocardial infarction (NSTEMI) (HCC) 10/2013   OSA on CPAP 01/19/2012   Sleep apnea    Type II diabetes mellitus (HCC)     Past Surgical History:  Procedure Laterality Date   CARDIAC CATHETERIZATION  07/2014   Left Main: Short, large-caliber vessel. Widely patent. Bifurcates into the LAD and Circumflex. Angiographically normal.   CORONARY ANGIOPLASTY WITH STENT PLACEMENT  01/2012; 11/07/2013; 08/01/2014   "1 + 2 + 1"    CORONARY ANGIOPLASTY WITH STENT PLACEMENT  07/2014   Severe single-vessel disease involving the bifurcation of OM1 and OM 2 with 99% in-stent restenosis in the bare-metal stent placed in the OM 2.   LEFT HEART CATHETERIZATION WITH  CORONARY ANGIOGRAM N/A 01/20/2012   Procedure: LEFT HEART CATHETERIZATION WITH CORONARY ANGIOGRAM;  Surgeon: Corky Crafts, MD;  Location: [facility] CATH LAB;  Service: Cardiovascular;  Laterality: N/A;   LEFT HEART CATHETERIZATION WITH CORONARY ANGIOGRAM N/A 11/07/2013   Procedure: LEFT HEART CATHETERIZATION WITH CORONARY ANGIOGRAM;  Surgeon: Corky Crafts, MD;  Location: [facility] CATH LAB;  Service: Cardiovascular;  Laterality: N/A;   LEFT HEART CATHETERIZATION WITH CORONARY ANGIOGRAM N/A 08/01/2014   Procedure: LEFT HEART CATHETERIZATION WITH CORONARY ANGIOGRAM;  Surgeon: Marykay Lex, MD;  Location: [facility] CATH LAB;  Service: Cardiovascular;  Laterality: N/A;   MULTIPLE TOOTH EXTRACTIONS     PERCUTANEOUS CORONARY STENT INTERVENTION (PCI-S)  01/20/2012   Procedure: PERCUTANEOUS CORONARY STENT INTERVENTION (PCI-S);  Surgeon: Corky Crafts, MD;  Location: [facility] CATH LAB;  Service: Cardiovascular;;   PERCUTANEOUS CORONARY STENT INTERVENTION (PCI-S)  11/07/2013   Procedure: PERCUTANEOUS CORONARY STENT INTERVENTION (PCI-S);  Surgeon: Corky Crafts, MD;  Location: [facility] CATH LAB;  Service: Cardiovascular;;    Family History  Problem Relation Age of Onset   Breast cancer Mother    Heart attack Father 23   Hypertension Sister    Hyperlipidemia Sister    Hyperlipidemia Sister    Hypertension Sister    Diabetes Sister    Heart attack Brother    Stroke Brother  Diabetes Brother    Colon cancer Neg Hx    Esophageal cancer Neg Hx    Rectal cancer Neg Hx    Stomach cancer Neg Hx     Social History   Socioeconomic History   Marital status: Single    Spouse name: Not on file   Number of children: 0   Years of education: 15   Highest education level: Not on file  Occupational History   Occupation: Unemployed    Comment: Used to work for [employer]   Occupation: Consulting civil engineer, Insurance underwriter [employer]  Tobacco Use   Smoking status: Former    Current packs/day: 0.00    Average packs/day:  1 pack/day for 42.4 years (42.4 ttl pk-yrs)    Types: Cigarettes    Start date: 09/13/1975    Quit date: 01/25/2018    Years since quitting: 5.4   Smokeless tobacco: Former    Types: Chew   Tobacco comments:    Bupropion helping  Vaping Use   Vaping status: Never Used  Substance and Sexual Activity   Alcohol use: Not Currently    Alcohol/week: 6.0 standard drinks of alcohol    Types: 6 Cans of beer per week    Comment: pt reports "I quit 66 days ago" (05/09/16)   Drug use: No    Comment: Crack Cocaine (denies 05/09/16)   Sexual activity: Not Currently  Other Topics Concern   Not on file  Social History Narrative   Single.  Lives with a roommate.  Ambulates independently.   Social Determinants of Health   Financial Resource Strain: Not on file  Food Insecurity: No Food Insecurity (04/11/2023)   Hunger Vital Sign    Worried About Running Out of Food in the Last Year: Never true    Ran Out of Food in the Last Year: Never true  Transportation Needs: No Transportation Needs (04/11/2023)   PRAPARE - Administrator, Civil Service (Medical): No    Lack of Transportation (Non-Medical): No  Physical Activity: Not on file  Stress: Not on file  Social Connections: Not on file  Intimate Partner Violence: Not At Risk (04/11/2023)   Humiliation, Afraid, Rape, and Kick questionnaire    Fear of Current or Ex-Partner: No    Emotionally Abused: No    Physically Abused: No    Sexually Abused: No     Physical Exam   Vitals:   07/20/23 0115 07/20/23 0116  BP: (!) 157/109 (!) 157/109  Pulse: 99   Resp: 18   Temp:    SpO2: 93%     CONSTITUTIONAL: Well-appearing, NAD NEURO/PSYCH:  Alert and oriented x 3, no focal deficits EYES:  eyes equal and reactive ENT/NECK:  no LAD, no JVD CARDIO: Regular rate, well-perfused, normal S1 and S2 PULM: Mild tachypnea, decreased breath sounds GI/GU:  non-distended, non-tender MSK/SPINE:  No gross deformities, no edema SKIN:  no rash,  atraumatic   *Additional and/or pertinent findings included in MDM below  Diagnostic and Interventional Summary    EKG Interpretation Date/Time:  Wednesday July 19 2023 23:59:57 EST Ventricular Rate:  103 PR Interval:  193 QRS Duration:  85 QT Interval:  367 QTC Calculation: 483 R Axis:   -31  Text Interpretation: Sinus tachycardia Left axis deviation Borderline prolonged QT interval Confirmed by Kennis Carina [id number]) on 07/20/2023 1:12:45 AM       Labs Reviewed  CBC - Abnormal; Notable for the following components:      Result Value  WBC 12.9 (*)    All other components within normal limits  BASIC METABOLIC PANEL - Abnormal; Notable for the following components:   CO2 21 (*)    Glucose, Bld 290 (*)    Anion gap 17 (*)    All other components within normal limits  CBG MONITORING, ED - Abnormal; Notable for the following components:   Glucose-Capillary 284 (*)    All other components within normal limits  CBG MONITORING, ED  TROPONIN I (HIGH SENSITIVITY)    DG Chest Port 1 View  Final Result      Medications  insulin glargine-yfgn (SEMGLEE) injection 12 Units (has no administration in time range)  ipratropium-albuterol (DUONEB) 0.5-2.5 (3) MG/3ML nebulizer solution 3 mL (3 mLs Nebulization Given 07/20/23 0116)  carvedilol (COREG) tablet 3.125 mg (3.125 mg Oral Given 07/20/23 0116)  lisinopril (ZESTRIL) tablet 2.5 mg (2.5 mg Oral Given 07/20/23 0116)  metFORMIN (GLUCOPHAGE) tablet 1,000 mg (1,000 mg Oral Given 07/20/23 0115)  atorvastatin (LIPITOR) tablet 80 mg (80 mg Oral Given 07/20/23 0115)     Procedures  /  Critical Care Procedures  ED Course and Medical Decision Making  Initial Impression and Ddx Suspect COPD exacerbation.  Other considerations include pneumothorax, ACS.  Overall doubt PE.  Reportedly was hypoxic at home with EMS but here saturations in the 90s, providing another breathing treatment, will reassess.  Past medical/surgical history that  increases complexity of ED encounter: COPD  Interpretation of Diagnostics I personally reviewed the EKG and my interpretation is as follows: Sinus rhythm no significant change from prior  Labs reassuring with no significant blood count or electrolyte disturbance.  Troponin negative, chest x-ray reassuring.  Patient Reassessment and Ultimate Disposition/Management     Patient feeling a lot better even before DuoNeb.  Has been talking freely without any shortness of breath since arrival.  Patient management required discussion with the following services or consulting groups:  None  Complexity of Problems Addressed Acute illness or injury that poses threat of life of bodily function  Additional Data Reviewed and Analyzed Further history obtained from: Prior ED visit notes and Prior labs/imaging results  Additional Factors Impacting ED Encounter Risk Consideration of hospitalization  Elmer Sow. Pilar Plate, MD [facility] Emergency Medicine [facility] [email or web address]  Final Clinical Impressions(s) / ED Diagnoses     ICD-10-CM   1. Dyspnea, unspecified type  R06.00       ED Discharge Orders     None        Discharge Instructions Discussed with and Provided to Patient:     Discharge Instructions      You were evaluated in the Emergency Department and after careful evaluation, we did not find any emergent condition requiring admission or further testing in the hospital.  Your exam/testing today is overall reassuring.  Recommend follow-up with your regular doctors.  Please return to the Emergency Department if you experience any worsening of your condition.   Thank you for allowing [place] to be a part of your care.       Sabas Sous, MD 07/20/23 [phone]

## 2023-07-20 NOTE — Assessment & Plan Note (Signed)
Stable.  He would like to continue his celexa.  Consider weaning in future

## 2023-07-20 NOTE — Assessment & Plan Note (Signed)
Improved with resolution of hypoglycemic episodes by lowering his lantus.  Will continue to lower slightly to 12 u daily as his GLP1 seems to be helping with appetite and weight

## 2023-07-20 NOTE — Assessment & Plan Note (Signed)
Transient mild shortness of breath and wheeze today that improved with MDI.  He established with pulmonary and has LFTs scheduled.  Continue current medications.  Recommend if continues to have intermittent episodes of shortness of breath he should follow up with [place] or pulmonary

## 2023-07-28 ENCOUNTER — Telehealth: Payer: Self-pay

## 2023-07-28 NOTE — Telephone Encounter (Signed)
Patient calls nurse line asking that I send message to Dr. Deirdre Priest regarding follow up from hospitalization on 07/19/23.  Patient reports that he is going out of town from 08/08/23-08/14/23 and wants to know if he needs to schedule with Dr. Deirdre Priest prior to going out of town. PCP does not have availability until 12/17.  Patient reports that he has had great improvement with his breathing since being discharged.   No current shortness of breath or difficulty breathing.   Will forward to Dr. Deirdre Priest for further advisement.   Veronda Prude, RN

## 2023-08-02 NOTE — Telephone Encounter (Signed)
Pls let him know if he is having symptoms he should come in to our office for a visit  Other wise he does not need to be seen for 3 months Thanks  LC

## 2023-08-03 NOTE — Telephone Encounter (Signed)
Patient informed. Penni Bombard CMA

## 2023-08-04 ENCOUNTER — Encounter: Payer: Self-pay | Admitting: Family Medicine

## 2023-08-18 ENCOUNTER — Other Ambulatory Visit: Payer: Self-pay | Admitting: Family Medicine

## 2023-08-30 ENCOUNTER — Other Ambulatory Visit: Payer: Self-pay | Admitting: Otolaryngology

## 2023-08-31 ENCOUNTER — Telehealth: Payer: Self-pay

## 2023-08-31 NOTE — Telephone Encounter (Signed)
Patient calls nurse line reporting shortness of breath.   He reports he has had labored breathing for the past two days. He reports he has been monitoring his pulse ox and reports lows ~88%. He reports compliance with albuterol and dulera. However, reports he has been using albuterol 8-9x per day.  He reports currently his pulse ox is reading 93%. He is speaking in full sentences. He reports he feels much better today than he did yesterday.   Patient advised to monitor his breathing and pulse ox throughout the remainder of the day and evening.   Patient scheduled for tomorrow morning in [facility] for evaluation.   Strict ED precautions given in the meantime.

## 2023-09-01 ENCOUNTER — Ambulatory Visit (INDEPENDENT_AMBULATORY_CARE_PROVIDER_SITE_OTHER): Payer: Medicaid Other | Admitting: Family Medicine

## 2023-09-01 ENCOUNTER — Telehealth: Payer: Self-pay

## 2023-09-01 VITALS — BP 128/60 | HR 83 | Ht 67.0 in | Wt 268.4 lb

## 2023-09-01 DIAGNOSIS — J449 Chronic obstructive pulmonary disease, unspecified: Secondary | ICD-10-CM | POA: Diagnosis present

## 2023-09-01 MED ORDER — AZITHROMYCIN 500 MG PO TABS: 500.0000 mg | ORAL_TABLET | Freq: Every day | ORAL | 0 refills | Status: AC

## 2023-09-01 MED ORDER — PREDNISONE 20 MG PO TABS: 40.0000 mg | ORAL_TABLET | Freq: Every day | ORAL | 0 refills | Status: AC

## 2023-09-01 MED ORDER — ALBUTEROL SULFATE HFA 108 (90 BASE) MCG/ACT IN AERS: 1.0000 | INHALATION_SPRAY | Freq: Four times a day (QID) | RESPIRATORY_TRACT | 0 refills | Status: DC | PRN

## 2023-09-01 NOTE — Patient Instructions (Addendum)
It was great to see you! Thank you for allowing me to participate in your care!  Our plans for today:  -You may use your albuterol inhaler 4 puffs every 4 hours as needed.  Need to use your inhaler more than this please make an appointment or seek care. -Continue using your Dulera. -I have sent 5 days of steroids for you to take to your pharmacy as well as 3 days of antibiotics. -Please call your pulmonologist to make a closer follow-up appointment.   Please arrive 15 minutes PRIOR to your next scheduled appointment time! If you do not, this affects OTHER patients' care.  Take care and seek immediate care sooner if you develop any concerns.   Celine Mans, MD, PGY-2 [facility] 10:33 AM 09/01/2023  [facility]

## 2023-09-01 NOTE — Telephone Encounter (Signed)
Good Morning [name]  We have received a surgical clearance request for Mr. Leachman for open tympanomastoidectomy.  He has a past medical history of PCI to RCA in 2013 and STEMI in 2015 treated with BMS to left circumflex and bifurcation of OM1 OM 2 due to in-stent restenosis treated with DES through previous BMS.  Due to his complex PCI history can you please offer guidance on holding his aspirin for upcoming procedure.  Please forward you guidance and recommendations to P CV DIV PREOP   Thank you

## 2023-09-01 NOTE — Assessment & Plan Note (Addendum)
Suspect patient had mild COPD versus asthma exacerbation over the past several days that is now resolved.  While patient has COPD documented in his chart, he is not scheduled for PFTs until later in April and is not currently on a LAMA.  Suspect that there is likely reactive asthmatic component to his lung disease as it does improve with albuterol.  Will treat as COPD exacerbation regardless with azithromycin for 3 days 500 mg, and prednisone 40 mg for 5 days.  Reassuringly today patient is not hypoxic and breathing comfortably on room air.  Low suspicion for cardiac cause or DVT given history and negative ROS.  Patient agreeable to plan.  Instructed to call his pulmonologist to follow-up at earlier date than April for PFT is inhaler optimization. Consider adding LAMA in interim.  Follow-up in our clinic 2 to 4 weeks.

## 2023-09-01 NOTE — Telephone Encounter (Signed)
   Pre-operative Risk Assessment    Patient Name: [name] [name]  DOB: April 18, 1961 MRN: [id number]    LOV: 05/08/23 - [name] NOV: 11/10/23 - [name]   Request for Surgical Clearance    Procedure:   LEFT OPEN CAVITY TYMPANOMASTOIDECTOMY   Date of Surgery:  Clearance 09/18/22                                 Surgeon:  DR. Jenne Pane  Surgeon's Group or Practice Name:  [facility] Phone number:  [phone] Fax number:  [phone]   Type of Clearance Requested:   - Medical  - Pharmacy:  Hold Aspirin NEED INSTRUCTIONS IF  OR WHEN TO HOLD    Type of Anesthesia:  Not Indicated   Additional requests/questions:    SignedMichaelle Copas   09/01/2023, 4:31 PM

## 2023-09-01 NOTE — Progress Notes (Signed)
    SUBJECTIVE:   CHIEF COMPLAINT / HPI: SOB  Called clinic on 08/31/23 with reports of labored breathing for past 2 days, increased albuterol use.  Was feeling more short of breath over the past couple of days. Noticed with exertion going the mailbox. Had increase cough as well. No chest pain. Feels like there was block in lungs. Was coughing of mucous. Improved with use albuterol. Is using Dulera and albuterol.   Still using CPAP. Denies any new swelling in feet.  Was hospitalized on 11/28 for [place] exacerb[place] in [place].   PERTINENT  PMH / PSH: HTN, CAD, OSA, COPD, T2DM, Angina.  OBJECTIVE:   BP 128/60   Pulse 83   Ht 5\' 7"  (1.702 m)   Wt 268 lb 6.4 oz (121.7 kg)   SpO2 94%   BMI 42.04 kg/m   General: NAD Neuro: A&O Cardiovascular: RRR, no murmurs, no peripheral edema Respiratory: normal WOB on RA, CTAB, no wheezes, ronchi or rales Extremities: Moving all 4 extremities equally   ASSESSMENT/PLAN:   Assessment & Plan COPD, moderate (HCC) Suspect patient had mild COPD versus asthma exacerbation over the past several days that is now resolved.  While patient has COPD documented in his chart, he is not scheduled for PFTs until later in April and is not currently on a LAMA.  Suspect that there is likely reactive asthmatic component to his lung disease as it does improve with albuterol.  Will treat as COPD exacerbation regardless with azithromycin for 3 days 500 mg, and prednisone 40 mg for 5 days.  Reassuringly today patient is not hypoxic and breathing comfortably on room air.  Low suspicion for cardiac cause or DVT given history and negative ROS.  Patient agreeable to plan.  Instructed to call his pulmonologist to follow-up at earlier date than April for PFT is inhaler optimization. Consider adding LAMA in interim.  Follow-up in our clinic 2 to 4 weeks.  Return in about 3 weeks (around 09/22/2023).  Celine Mans, MD [facility]

## 2023-09-02 NOTE — Telephone Encounter (Signed)
   Patient Name: [name] [name]  DOB: 1960-09-28 MRN: [id number]  Primary Cardiologist: Peter Swaziland, MD  Chart reviewed as part of pre-operative protocol coverage. Given past medical history and time since last visit, based on ACC/AHA guidelines, [name] [name] is at acceptable risk for the planned procedure without further cardiovascular testing.   Patient can hold Effient 5 days prior to his procedure and continue ASA 81 mg thru perioperative period.  The patient was advised that if he develops new symptoms prior to surgery to contact our office to arrange for a follow-up visit, and he verbalized understanding.  I will route this recommendation to the requesting party via Epic fax function and remove from pre-op pool.  Please call with questions.  Napoleon Form, [name] Rains, NP 09/02/2023, 8:28 AM

## 2023-09-15 NOTE — Telephone Encounter (Signed)
 Preop clearace notes now routed to [facility] @ fax #: [phone]

## 2023-09-15 NOTE — Telephone Encounter (Signed)
 Marcelino Duster with [facility] asked for Clearance to be faxed over again please and to attn her name   Fax: [phone]

## 2023-09-18 ENCOUNTER — Other Ambulatory Visit: Payer: Self-pay

## 2023-09-18 ENCOUNTER — Encounter (HOSPITAL_COMMUNITY): Payer: Self-pay | Admitting: Vascular Surgery

## 2023-09-18 ENCOUNTER — Encounter (HOSPITAL_COMMUNITY): Payer: Self-pay | Admitting: Otolaryngology

## 2023-09-18 ENCOUNTER — Ambulatory Visit (INDEPENDENT_AMBULATORY_CARE_PROVIDER_SITE_OTHER): Payer: Medicaid Other | Admitting: Podiatry

## 2023-09-18 DIAGNOSIS — B351 Tinea unguium: Secondary | ICD-10-CM | POA: Diagnosis not present

## 2023-09-18 DIAGNOSIS — M79675 Pain in left toe(s): Secondary | ICD-10-CM | POA: Diagnosis not present

## 2023-09-18 DIAGNOSIS — M79674 Pain in right toe(s): Secondary | ICD-10-CM | POA: Diagnosis not present

## 2023-09-18 NOTE — Anesthesia Preprocedure Evaluation (Signed)
 Anesthesia Evaluation    Airway        Dental   Pulmonary former smoker          Cardiovascular hypertension,      Neuro/Psych    GI/Hepatic   Endo/Other  diabetes    Renal/GU      Musculoskeletal   Abdominal   Peds  Hematology   Anesthesia Other Findings   Reproductive/Obstetrics                             Anesthesia Physical Anesthesia Plan  ASA:   Anesthesia Plan:    Post-op Pain Management:    Induction:   PONV Risk Score and Plan:   Airway Management Planned:   Additional Equipment:   Intra-op Plan:   Post-operative Plan:   Informed Consent:   Plan Discussed with:   Anesthesia Plan Comments: (PAT note written 09/18/2023 by Jeiry Birnbaum, PA-C. Notified Dr. Milagros office on 09/18/23, that last dose of Effient  was 09/18/23.   )       Anesthesia Quick Evaluation

## 2023-09-18 NOTE — Progress Notes (Signed)
 PCP - [facility]- former Dr Layman Bee Cardiologist - Dr Peter Jordan (Clearance on 09/02/23) Podiatry - [place] [facility] Pulmonary - Dr Lonna Coder  Chest x-ray - 07/20/23, 08/10/23 CE EKG - 07/19/23 Stress Test - 07/21/14 ECHO - 05/10/21 Cardiac Cath - 07/2014  ICD Pacemaker/Loop - n/a  Sleep Study -  Yes (03/2020) CPAP - uses nightly  Diabetes Type 2 Fasting Blood Sugar - 195s-200s Checks Blood Sugar __2___ times a day  Last dose of GLP1 agonist Ozempic  was on 09/11/23  GLP1 instructions: Do not take Ozempic  on the morning of surgery.  Do not take Jardiance  today (09/18/23) or on the morning of surgery (09/19/23).   (This is a SDW call.)  Do not take Metformin  on the morninig of surgery.     THE MORNING OF SURGERY, take 7-8 units Lantus  Insulin .    If your blood sugar is less than 70 mg/dL, you will need to treat for low blood sugar: Treat a low blood sugar (less than 70 mg/dL) with  cup of clear juice (cranberry or apple), 4 glucose tablets, OR glucose gel. Recheck blood sugar in 15 minutes after treatment (to make sure it is greater than 70 mg/dL). If your blood sugar is not greater than 70 mg/dL on recheck, call [phone] for further instructions.  Blood Thinner Instructions:  Follow your surgeon's instructions (09/02/23) to Hold effient  5 days prior to surgery.  Last dose was on 09/18/23. (This is a SDW call)  Aspirin  Instructions: Follow your surgeon's instructions (09/05/23) to continue Aspirin  thru perioperative period.    Nitroglycerin  - If you have to take this medication prior to surgery, please call [phone] and report this to a nurse.  ERAS - Clear liquids til 9:15 AM DOS.     Anesthesia review: Yes  STOP now taking any Aspirin  (unless otherwise instructed by your surgeon), Aleve, Mobic , Naproxen, Ibuprofen, Motrin, Advil, Goody's, BC's, all herbal medications, fish oil , and all vitamins.   Coronavirus  Screening Do you have any of the following symptoms:  Cough yes/no: No Fever (>100.40F)  yes/no: No Runny nose yes/no: No Sore throat yes/no: No Difficulty breathing/shortness of breath  Yes, occasional  Have you traveled in the last 14 days and where? yes/no: No  Patient verbalized understanding of instructions that were given via phone.

## 2023-09-18 NOTE — Progress Notes (Signed)
 Subjective:  Patient ID: [name] [name], male    DOB: 1960-11-10,  MRN: [id number]  Patient notes nails are thick, discolored, elongated and painful in shoegear when trying to ambulate.    Patient notes he was in the hospital recently with difficulty breathing.  He was placed on steroids which helped tremendously.  However, he notes this caused his blood sugar to raise.  PCP is Jeanelle Layman CROME, MD.  Past Medical History:  Diagnosis Date   Alcoholism (HCC)    Anxiety    Arthritis    knees   Asthma    CAD S/P percutaneous coronary angioplasty 01/18/12; 10/2013   a. pRCA 3.5 x 18 vision BMS - 4.2 mm; b. 2/'15: mCx 3.5 x 12  Rebel BMS (3.6-3.7 mm)   Chronic back pain    mid/lower (08/01/2014)   COPD (chronic obstructive pulmonary disease) (HCC)    I'm seeing COPD dr now; don't know if I've got it (08/01/2014)   Depression    Dyspnea    GERD (gastroesophageal reflux disease)    Hyperlipidemia    Hypertension    Migraines    once in awhile (08/01/2014); no current problems as of 09/18/23 per pt.   Non-ST elevation myocardial infarction (NSTEMI) (HCC) 10/2013   OSA on CPAP 01/19/2012   Pneumonia    Sleep apnea    uses CPAP nightly   Type II diabetes mellitus [facility])     Past Surgical History:  Procedure Laterality Date   CARDIAC CATHETERIZATION  07/2014   Left Main: Short, large-caliber vessel. Widely patent. Bifurcates into the LAD and Circumflex. Angiographically normal.   CORONARY ANGIOPLASTY WITH STENT PLACEMENT  01/2012; 11/07/2013; 08/01/2014   1 + 2 + 1    CORONARY ANGIOPLASTY WITH STENT PLACEMENT  07/2014   Severe single-vessel disease involving the bifurcation of OM1 and OM 2 with 99% in-stent restenosis in the bare-metal stent placed in the OM 2.   LEFT HEART CATHETERIZATION WITH CORONARY ANGIOGRAM N/A 01/20/2012   Procedure: LEFT HEART CATHETERIZATION WITH CORONARY ANGIOGRAM;  Surgeon: Candyce GORMAN Reek, MD;  Location: [facility] CATH LAB;  Service:  Cardiovascular;  Laterality: N/A;   LEFT HEART CATHETERIZATION WITH CORONARY ANGIOGRAM N/A 11/07/2013   Procedure: LEFT HEART CATHETERIZATION WITH CORONARY ANGIOGRAM;  Surgeon: Candyce GORMAN Reek, MD;  Location: [facility] CATH LAB;  Service: Cardiovascular;  Laterality: N/A;   LEFT HEART CATHETERIZATION WITH CORONARY ANGIOGRAM N/A 08/01/2014   Procedure: LEFT HEART CATHETERIZATION WITH CORONARY ANGIOGRAM;  Surgeon: Alm LELON Clay, MD;  Location: [facility] CATH LAB;  Service: Cardiovascular;  Laterality: N/A;   MULTIPLE TOOTH EXTRACTIONS     PERCUTANEOUS CORONARY STENT INTERVENTION (PCI-S)  01/20/2012   Procedure: PERCUTANEOUS CORONARY STENT INTERVENTION (PCI-S);  Surgeon: Candyce GORMAN Reek, MD;  Location: [facility] CATH LAB;  Service: Cardiovascular;;   PERCUTANEOUS CORONARY STENT INTERVENTION (PCI-S)  11/07/2013   Procedure: PERCUTANEOUS CORONARY STENT INTERVENTION (PCI-S);  Surgeon: Candyce GORMAN Reek, MD;  Location: [facility] CATH LAB;  Service: Cardiovascular;;    Allergies  Allergen Reactions   Shellfish Allergy Nausea And Vomiting    OYSTERS    Review of Systems: Negative except as noted in the HPI.  Objective:  [name] [name] is a pleasant 63 y.o. male in NAD. AAO x 3.  Vascular Examination: Capillary refill time is 3-5 seconds to toes bilateral. Palpable pedal pulses b/l LE. Digital hair present b/l.  Skin temperature gradient WNL b/l. No varicosities b/l. No cyanosis noted b/l.   Dermatological Examination: Pedal skin with  normal turgor, texture and tone b/l. No open wounds. No interdigital macerations b/l. Toenails x10 are 3mm thick, discolored, dystrophic with subungual debris. There is pain with compression of the nail plates.  They are elongated x10      Latest Ref Rng & Units 07/19/2023   11:23 AM 04/11/2023    7:45 AM 02/14/2023   11:33 AM 11/22/2022    9:50 AM  Hemoglobin A1C  Hemoglobin-A1c 0.0 - 7.0 % 7.9  8.2  8.1  7.9     Assessment/Plan: 1. Pain due to onychomycosis of toenails of both  feet    The mycotic toenails were sharply debrided x10 with sterile nail nippers and a power debriding burr to decrease bulk/thickness and length.    Return in about 3 months (around 12/17/2023) for [facility].   Awanda CHARM Imperial, DPM, FACFAS [facility]     [address] [place], [place] [address]                Office [phone]  Fax [phone]

## 2023-09-18 NOTE — Telephone Encounter (Signed)
 Requesting office is calling requesting this be faxed again with ATTN: Marcelino Duster.

## 2023-09-18 NOTE — Telephone Encounter (Signed)
 I s/w Rosaline and the office production designer, theatre/television/film at ENT. Their office had a question about the wording in the pre op clearance notes of the word CAN being used. Questioning if weather or not the medication can be held. I did explain that when our office receives a clearance request with med hold for our cardiologist and/or pre op APP's to review, the notes will reflect a recommendation from our team. I stated that our team will provide a recommendation for a safe hold time, however it is up to the surgeon how long they feel they need to hold blood thinner and when to restart as they are able to assess the bleeding factor.   I will run this past our preop APP today to be sure these notes are correct.

## 2023-09-18 NOTE — Progress Notes (Signed)
 Anesthesia Chart Review: SAME DAY WORK-UP  Case: [id number] Date/Time: 09/19/23 1200   Procedure: LEFT OPEN CAVITY TYMPANOMASTOIDECTOMY (Left)   Anesthesia type: General   Pre-op diagnosis: Bilateral Chronic mastoiditis; Otorrhea; Mixed conductive and sensorineural hearing loss   [facility] ROOM 09 / [facility] OR   Surgeons: Carlie Clark, MD       DISCUSSION: Patient is a 63 year old male scheduled for the above procedure.  History includes former smoker (quit 01/25/18), HTN, HLD, CAD (BMS RCA 01/20/12; NSTEMI, s/p BMS mid CX and BMS OM2 11/08/13; DES spanning across proximal CX through BMS of OM2 08/01/14), DM2, GERD, COPD, asthma, OSA (uses CPAP), dyspnea, alcoholism (sober since ~ 12/2020), chronic back pain, depression.   After communication with primary cardiologist Dr. Jordan, preoperative cardiology input was outlined on 09/02/23 by Wyn Jackee Raddle, NP, Given past medical history and time since last visit, based on ACC/AHA guidelines, [name] [name] is at acceptable risk for the planned procedure without further cardiovascular testing.    Patient can hold Effient  5 days prior to his procedure and continue ASA 81 mg thru perioperative period... On 09/18/23, he reported last Effient  was on 09/18/23. I notified Megan at Dr. Carlie office. She will have him review to determine if case will need to be rescheduled.   Admitted to [facility] 08/10/23 for COPD exacerbation. Possible mild COPD or asthma exacerbation on 09/01/23 and completed 3 day course of azithromycin  and prednisone . He is scheduled for PFTs on 12/18/23 per pulmonologist Dr. Theophilus. He is on Dulera  and as needed albuterol . Denied current cough, fever, runny nose, sore throat per RN interview.   A1c 8.1% on 08/10/23. He reported fasting CBGs ~ 195-200's. He is on Ozempic , metformin , Lantus , and Jardiance . Last Ozempic  09/11/23. Last Jardiance  09/17/23.   He is a same day work-up. Anesthesia team to evaluate on the day of surgery.    VS: Ht 5'  7 (1.702 m)   Wt 120.2 kg   BMI 41.50 kg/m  BP Readings from Last 3 Encounters:  09/01/23 128/60  07/20/23 (!) 162/90  07/19/23 (!) 125/49   Pulse Readings from Last 3 Encounters:  09/01/23 83  07/20/23 98  07/19/23 93     PROVIDERS: Jeanelle Layman CROME, MD is PCP  Jordan, Peter, MD is cardiologist  Mannam, Praveen, MD is pulmonlogist   LABS: For day of surgery. Most recent lab results in Care Everywhere for 08/10/23 show A1c 8.1%, Na 139, K 3.8, BUN 15, Cr 0.8, WBC 11.8, H/H 14.8/44.7, PLT 239.   IMAGES: CXR 07/20/23:  FINDINGS: The heart size and mediastinal contours are within normal limits. Both lungs are clear. The visualized skeletal structures are unremarkable. IMPRESSION: No active disease.   CT Temporal bone 05/24/23: IMPRESSION: Otomastoid opacification that is chronic based prior imaging and on under-pneumatization and sclerosis of the mastoids. No erosive changes for cholesteatoma.   CT Chest LCS 09/19/22: IMPRESSION: 1. Lung-RADS 2S, benign appearance or behavior. Continue annual screening with low-dose chest CT without contrast in 12 months. S modifier for coronary artery calcifications. 2. Mildly nodular liver contour which is suggestive of cirrhosis. 3. Severe three-vessel coronary artery calcifications. 4. Aortic Atherosclerosis (ICD10-I70.0) and Emphysema (ICD10-J43.9).    EKG: 07/24/2023: Sinus rhythm.  Left anterior fascicular block.   CV: Echo 05/10/21: IMPRESSIONS   1. Left ventricular ejection fraction, by estimation, is 60 to 65%. The  left ventricle has normal function. The left ventricle has no regional  wall motion abnormalities. There is mild left ventricular hypertrophy.  Left ventricular diastolic parameters  are consistent with Grade I diastolic dysfunction (impaired relaxation).   2. Right ventricular systolic function is normal. The right ventricular  size is normal. Tricuspid regurgitation signal is inadequate for assessing   PA pressure.   3. The mitral valve is normal in structure. No evidence of mitral valve  regurgitation. No evidence of mitral stenosis.   4. The aortic valve is tricuspid. Aortic valve regurgitation is not  visualized. Mild aortic valve sclerosis is present, with no evidence of  aortic valve stenosis.   5. The inferior vena cava is normal in size with <50% respiratory  variability, suggesting right atrial pressure of 8 mmHg.    Cardiac cath 08/01/14: POST-OPERATIVE DIAGNOSIS:   Severe single-vessel disease involving the bifurcation of OM1 and OM 2 with 99% in-stent restenosis in the bare-metal stent placed in the OM 2. Successful PCI spanning the proximal circumflex stent across OM 1 through the bare-metal stent placed in OM 2 with a Xience alpine DES stent Widely patent stent in the RCA and proximal circumflex with otherwise mild-moderate disease. Preserved LVEF with mildly elevated LVEDP   PLAN OF CARE: The patient was monitored overnight for standard post radial cath care. He already has Effient  and has been on it with the exception of missing maybe 5 doses since February. He should be fine with drug diluting stent placement Anticipate discharge the morning Continue cardiac risk factor modification including smoking cessation counseling   Past Medical History:  Diagnosis Date   Alcoholism (HCC)    Anxiety    Arthritis    knees   Asthma    CAD S/P percutaneous coronary angioplasty 01/18/12; 10/2013   a. pRCA 3.5 x 18 vision BMS - 4.2 mm; b. 2/'15: mCx 3.5 x 12  Rebel BMS (3.6-3.7 mm)   Chronic back pain    mid/lower (08/01/2014)   COPD (chronic obstructive pulmonary disease) (HCC)    I'm seeing COPD dr now; don't know if I've got it (08/01/2014)   Depression    Dyspnea    GERD (gastroesophageal reflux disease)    Hyperlipidemia    Hypertension    Migraines    once in awhile (08/01/2014); no current problems as of 09/18/23 per pt.   Non-ST elevation myocardial  infarction (NSTEMI) (HCC) 10/2013   OSA on CPAP 01/19/2012   Pneumonia    Sleep apnea    uses CPAP nightly   Type II diabetes mellitus [facility])     Past Surgical History:  Procedure Laterality Date   CARDIAC CATHETERIZATION  07/2014   Left Main: Short, large-caliber vessel. Widely patent. Bifurcates into the LAD and Circumflex. Angiographically normal.   CORONARY ANGIOPLASTY WITH STENT PLACEMENT  01/2012; 11/07/2013; 08/01/2014   1 + 2 + 1    CORONARY ANGIOPLASTY WITH STENT PLACEMENT  07/2014   Severe single-vessel disease involving the bifurcation of OM1 and OM 2 with 99% in-stent restenosis in the bare-metal stent placed in the OM 2.   LEFT HEART CATHETERIZATION WITH CORONARY ANGIOGRAM N/A 01/20/2012   Procedure: LEFT HEART CATHETERIZATION WITH CORONARY ANGIOGRAM;  Surgeon: Candyce GORMAN Reek, MD;  [facility] CATH LAB;  Service: Cardiovascular;  Laterality: N/A;   LEFT HEART CATHETERIZATION WITH CORONARY ANGIOGRAM N/A 11/07/2013   Procedure: LEFT HEART CATHETERIZATION WITH CORONARY ANGIOGRAM;  Surgeon: Candyce GORMAN Reek, MD;  Location: [facility] CATH LAB;  Service: Cardiovascular;  Laterality: N/A;   LEFT HEART CATHETERIZATION WITH CORONARY ANGIOGRAM N/A 08/01/2014   Procedure: LEFT HEART CATHETERIZATION WITH CORONARY ANGIOGRAM;  Surgeon: Alm LELON Clay, MD;  Location: [facility] CATH LAB;  Service: Cardiovascular;  Laterality: N/A;   MULTIPLE TOOTH EXTRACTIONS     PERCUTANEOUS CORONARY STENT INTERVENTION (PCI-S)  01/20/2012   Procedure: PERCUTANEOUS CORONARY STENT INTERVENTION (PCI-S);  Surgeon: Candyce GORMAN Reek, MD;  Location: [facility] CATH LAB;  Service: Cardiovascular;;   PERCUTANEOUS CORONARY STENT INTERVENTION (PCI-S)  11/07/2013   Procedure: PERCUTANEOUS CORONARY STENT INTERVENTION (PCI-S);  Surgeon: Candyce GORMAN Reek, MD;  Location: [facility] CATH LAB;  Service: Cardiovascular;;    MEDICATIONS: No current facility-administered medications for this encounter.    albuterol  (VENTOLIN  HFA) 108 (90  Base) MCG/ACT inhaler   aspirin  81 MG chewable tablet   atorvastatin  (LIPITOR ) 80 MG tablet   carvedilol  (COREG ) 3.125 MG tablet   citalopram  (CELEXA ) 20 MG tablet   gentamicin  cream (GARAMYCIN ) 0.1 %   insulin  glargine (LANTUS  SOLOSTAR) 100 UNIT/ML Solostar Pen   JARDIANCE  25 MG TABS tablet   lisinopril  (ZESTRIL ) 2.5 MG tablet   Melatonin 2.5 MG CAPS   metFORMIN  (GLUCOPHAGE ) 1000 MG tablet   mometasone -formoterol  (DULERA ) 200-5 MCG/ACT AERO   Multiple Vitamin (MULTIVITAMIN) tablet   nitroGLYCERIN  (NITROSTAT ) 0.4 MG SL tablet   Omega-3 Fatty Acids  (FISH OIL ) 1000 MG CAPS   prasugrel  (EFFIENT ) 10 MG TABS tablet   CIPRODEX  OTIC suspension   Continuous Glucose Sensor (FREESTYLE LIBRE 2 SENSOR) MISC   DROPLET PEN NEEDLES 29G X MISC   glucose blood (ACCU-CHEK GUIDE) test strip   Insulin  Pen Needle (PEN NEEDLES) 32G X 4 MM MISC   meloxicam  (MOBIC ) 7.5 MG tablet   NON FORMULARY   Semaglutide , 2 MG/DOSE, (OZEMPIC , 2 MG/DOSE,) 8 MG/3ML SOPN   traMADol  (ULTRAM ) 50 MG tablet    Isaiah Ruder, PA-C Surgical Short Stay/Anesthesiology [facility] Phone [phone] [facility] Phone [phone] 09/18/2023 3:56 PM

## 2023-09-18 NOTE — Telephone Encounter (Signed)
 Michelle from requesting office is requesting call back to make sure patient doesn't need to hold medication 5 days prior. States surgery is tomorrow and would need to know this information asap. She says that if you do not get her to please leave the detail message for this.   CB # [phone]

## 2023-09-19 ENCOUNTER — Inpatient Hospital Stay (HOSPITAL_COMMUNITY)
Admission: EM | Admit: 2023-09-19 | Discharge: 2023-09-22 | DRG: 391 | Disposition: A | Discharge status: Home / Self Care | Payer: Medicaid Other | Attending: Family Medicine | Admitting: Family Medicine

## 2023-09-19 ENCOUNTER — Ambulatory Visit (HOSPITAL_COMMUNITY): Admission: RE | Admit: 2023-09-19 | Payer: Medicaid Other | Source: Home / Self Care | Admitting: Otolaryngology

## 2023-09-19 DIAGNOSIS — I2510 Atherosclerotic heart disease of native coronary artery without angina pectoris: Secondary | ICD-10-CM | POA: Diagnosis present

## 2023-09-19 DIAGNOSIS — J9601 Acute respiratory failure with hypoxia: Principal | ICD-10-CM | POA: Diagnosis present

## 2023-09-19 DIAGNOSIS — T380X5A Adverse effect of glucocorticoids and synthetic analogues, initial encounter: Secondary | ICD-10-CM | POA: Diagnosis present

## 2023-09-19 DIAGNOSIS — Z794 Long term (current) use of insulin: Secondary | ICD-10-CM

## 2023-09-19 DIAGNOSIS — I444 Left anterior fascicular block: Secondary | ICD-10-CM | POA: Diagnosis present

## 2023-09-19 DIAGNOSIS — Z955 Presence of coronary angioplasty implant and graft: Secondary | ICD-10-CM

## 2023-09-19 DIAGNOSIS — Z7951 Long term (current) use of inhaled steroids: Secondary | ICD-10-CM

## 2023-09-19 DIAGNOSIS — I10 Essential (primary) hypertension: Secondary | ICD-10-CM | POA: Diagnosis present

## 2023-09-19 DIAGNOSIS — J9621 Acute and chronic respiratory failure with hypoxia: Secondary | ICD-10-CM | POA: Diagnosis present

## 2023-09-19 DIAGNOSIS — Z87891 Personal history of nicotine dependence: Secondary | ICD-10-CM

## 2023-09-19 DIAGNOSIS — F32A Depression, unspecified: Secondary | ICD-10-CM | POA: Diagnosis present

## 2023-09-19 DIAGNOSIS — Z833 Family history of diabetes mellitus: Secondary | ICD-10-CM

## 2023-09-19 DIAGNOSIS — K219 Gastro-esophageal reflux disease without esophagitis: Principal | ICD-10-CM | POA: Diagnosis present

## 2023-09-19 DIAGNOSIS — E118 Type 2 diabetes mellitus with unspecified complications: Secondary | ICD-10-CM | POA: Diagnosis present

## 2023-09-19 DIAGNOSIS — J45909 Unspecified asthma, uncomplicated: Secondary | ICD-10-CM | POA: Diagnosis present

## 2023-09-19 DIAGNOSIS — Z91013 Allergy to seafood: Secondary | ICD-10-CM

## 2023-09-19 DIAGNOSIS — J441 Chronic obstructive pulmonary disease with (acute) exacerbation: Principal | ICD-10-CM | POA: Diagnosis present

## 2023-09-19 DIAGNOSIS — Z6841 Body Mass Index (BMI) 40.0 and over, adult: Secondary | ICD-10-CM

## 2023-09-19 DIAGNOSIS — E119 Type 2 diabetes mellitus without complications: Secondary | ICD-10-CM | POA: Diagnosis present

## 2023-09-19 DIAGNOSIS — E785 Hyperlipidemia, unspecified: Secondary | ICD-10-CM | POA: Diagnosis present

## 2023-09-19 DIAGNOSIS — Z7985 Long-term (current) use of injectable non-insulin antidiabetic drugs: Secondary | ICD-10-CM

## 2023-09-19 DIAGNOSIS — Z79899 Other long term (current) drug therapy: Secondary | ICD-10-CM

## 2023-09-19 DIAGNOSIS — Z7982 Long term (current) use of aspirin: Secondary | ICD-10-CM

## 2023-09-19 DIAGNOSIS — Z7902 Long term (current) use of antithrombotics/antiplatelets: Secondary | ICD-10-CM

## 2023-09-19 DIAGNOSIS — Z7984 Long term (current) use of oral hypoglycemic drugs: Secondary | ICD-10-CM

## 2023-09-19 DIAGNOSIS — E1165 Type 2 diabetes mellitus with hyperglycemia: Secondary | ICD-10-CM | POA: Diagnosis present

## 2023-09-19 DIAGNOSIS — G4733 Obstructive sleep apnea (adult) (pediatric): Secondary | ICD-10-CM | POA: Diagnosis present

## 2023-09-19 DIAGNOSIS — F1010 Alcohol abuse, uncomplicated: Secondary | ICD-10-CM | POA: Diagnosis present

## 2023-09-19 DIAGNOSIS — Z8249 Family history of ischemic heart disease and other diseases of the circulatory system: Secondary | ICD-10-CM

## 2023-09-19 HISTORY — DX: Dyspnea, unspecified: R06.00

## 2023-09-19 HISTORY — DX: Pneumonia, unspecified organism: J18.9

## 2023-09-19 SURGERY — TYMPANOPLASTY, WITH MASTOIDECTOMY
Anesthesia: General | Laterality: Left

## 2023-09-19 MED ORDER — ALBUTEROL SULFATE HFA 108 (90 BASE) MCG/ACT IN AERS: 2.0000 | INHALATION_SPRAY | RESPIRATORY_TRACT | Status: DC | PRN

## 2023-09-19 MED ORDER — IPRATROPIUM-ALBUTEROL 0.5-2.5 (3) MG/3ML IN SOLN
3.0000 mL | Freq: Once | RESPIRATORY_TRACT | Status: AC
  Administered 2023-09-20: 3 mL via RESPIRATORY_TRACT

## 2023-09-19 NOTE — ED Triage Notes (Signed)
 Pt arrived from home via [facility] resp distress. Pt received in enroute 2 duo nebs, 2g mg, and 125mg  solumedrol. Pt was 80% room air per ems upon arrival. Denies chest pain

## 2023-09-20 ENCOUNTER — Other Ambulatory Visit: Payer: Self-pay

## 2023-09-20 ENCOUNTER — Encounter (HOSPITAL_COMMUNITY): Payer: Self-pay

## 2023-09-20 ENCOUNTER — Emergency Department (HOSPITAL_COMMUNITY): Payer: Medicaid Other

## 2023-09-20 DIAGNOSIS — Z7951 Long term (current) use of inhaled steroids: Secondary | ICD-10-CM | POA: Diagnosis not present

## 2023-09-20 DIAGNOSIS — I10 Essential (primary) hypertension: Secondary | ICD-10-CM | POA: Diagnosis present

## 2023-09-20 DIAGNOSIS — Z7902 Long term (current) use of antithrombotics/antiplatelets: Secondary | ICD-10-CM | POA: Diagnosis not present

## 2023-09-20 DIAGNOSIS — J9601 Acute respiratory failure with hypoxia: Secondary | ICD-10-CM | POA: Diagnosis present

## 2023-09-20 DIAGNOSIS — Z955 Presence of coronary angioplasty implant and graft: Secondary | ICD-10-CM | POA: Diagnosis not present

## 2023-09-20 DIAGNOSIS — F32A Depression, unspecified: Secondary | ICD-10-CM | POA: Diagnosis present

## 2023-09-20 DIAGNOSIS — F1010 Alcohol abuse, uncomplicated: Secondary | ICD-10-CM | POA: Diagnosis present

## 2023-09-20 DIAGNOSIS — Z794 Long term (current) use of insulin: Secondary | ICD-10-CM | POA: Diagnosis not present

## 2023-09-20 DIAGNOSIS — Z8249 Family history of ischemic heart disease and other diseases of the circulatory system: Secondary | ICD-10-CM | POA: Diagnosis not present

## 2023-09-20 DIAGNOSIS — Z79899 Other long term (current) drug therapy: Secondary | ICD-10-CM | POA: Diagnosis not present

## 2023-09-20 DIAGNOSIS — Z7985 Long-term (current) use of injectable non-insulin antidiabetic drugs: Secondary | ICD-10-CM | POA: Diagnosis not present

## 2023-09-20 DIAGNOSIS — E1165 Type 2 diabetes mellitus with hyperglycemia: Secondary | ICD-10-CM | POA: Diagnosis present

## 2023-09-20 DIAGNOSIS — I444 Left anterior fascicular block: Secondary | ICD-10-CM | POA: Diagnosis present

## 2023-09-20 DIAGNOSIS — Z87891 Personal history of nicotine dependence: Secondary | ICD-10-CM | POA: Diagnosis not present

## 2023-09-20 DIAGNOSIS — E785 Hyperlipidemia, unspecified: Secondary | ICD-10-CM | POA: Diagnosis present

## 2023-09-20 DIAGNOSIS — T380X5A Adverse effect of glucocorticoids and synthetic analogues, initial encounter: Secondary | ICD-10-CM | POA: Diagnosis present

## 2023-09-20 DIAGNOSIS — G4733 Obstructive sleep apnea (adult) (pediatric): Secondary | ICD-10-CM | POA: Diagnosis present

## 2023-09-20 DIAGNOSIS — Z7984 Long term (current) use of oral hypoglycemic drugs: Secondary | ICD-10-CM | POA: Diagnosis not present

## 2023-09-20 DIAGNOSIS — K219 Gastro-esophageal reflux disease without esophagitis: Secondary | ICD-10-CM | POA: Diagnosis present

## 2023-09-20 DIAGNOSIS — I2510 Atherosclerotic heart disease of native coronary artery without angina pectoris: Secondary | ICD-10-CM | POA: Diagnosis present

## 2023-09-20 DIAGNOSIS — J441 Chronic obstructive pulmonary disease with (acute) exacerbation: Secondary | ICD-10-CM | POA: Diagnosis not present

## 2023-09-20 DIAGNOSIS — J9620 Acute and chronic respiratory failure, unspecified whether with hypoxia or hypercapnia: Secondary | ICD-10-CM | POA: Diagnosis not present

## 2023-09-20 DIAGNOSIS — R0600 Dyspnea, unspecified: Secondary | ICD-10-CM | POA: Diagnosis present

## 2023-09-20 DIAGNOSIS — Z6841 Body Mass Index (BMI) 40.0 and over, adult: Secondary | ICD-10-CM | POA: Diagnosis not present

## 2023-09-20 DIAGNOSIS — Z7982 Long term (current) use of aspirin: Secondary | ICD-10-CM | POA: Diagnosis not present

## 2023-09-20 DIAGNOSIS — Z833 Family history of diabetes mellitus: Secondary | ICD-10-CM | POA: Diagnosis not present

## 2023-09-20 LAB — CBC WITH DIFFERENTIAL/PLATELET
Abs Immature Granulocytes: 0.07 10*3/uL (ref 0.00–0.07)
Basophils Absolute: 0.1 10*3/uL (ref 0.0–0.1)
Basophils Relative: 0 %
Eosinophils Absolute: 0 10*3/uL (ref 0.0–0.5)
Eosinophils Relative: 0 %
HCT: 39.9 % (ref 39.0–52.0)
Hemoglobin: 13.4 g/dL (ref 13.0–17.0)
Immature Granulocytes: 1 %
Lymphocytes Relative: 5 %
Lymphs Abs: 0.7 10*3/uL (ref 0.7–4.0)
MCH: 29.4 pg (ref 26.0–34.0)
MCHC: 33.6 g/dL (ref 30.0–36.0)
MCV: 87.5 fL (ref 80.0–100.0)
Monocytes Absolute: 0.2 10*3/uL (ref 0.1–1.0)
Monocytes Relative: 1 %
Neutro Abs: 11.7 10*3/uL — ABNORMAL HIGH (ref 1.7–7.7)
Neutrophils Relative %: 93 %
Platelets: 230 10*3/uL (ref 150–400)
RBC: 4.56 MIL/uL (ref 4.22–5.81)
RDW: 14.6 % (ref 11.5–15.5)
WBC: 12.6 10*3/uL — ABNORMAL HIGH (ref 4.0–10.5)
nRBC: 0 % (ref 0.0–0.2)

## 2023-09-20 LAB — BASIC METABOLIC PANEL
Anion gap: 13 (ref 5–15)
BUN: 12 mg/dL (ref 8–23)
CO2: 23 mmol/L (ref 22–32)
Calcium: 9.1 mg/dL (ref 8.9–10.3)
Chloride: 100 mmol/L (ref 98–111)
Creatinine, Ser: 0.74 mg/dL (ref 0.61–1.24)
GFR, Estimated: >60 mL/min (ref >60)
Glucose, Bld: 192 mg/dL — ABNORMAL HIGH (ref 70–99)
Potassium: 3.6 mmol/L (ref 3.5–5.1)
Sodium: 136 mmol/L (ref 135–145)

## 2023-09-20 LAB — I-STAT VENOUS BLOOD GAS, ED
Acid-Base Excess: 3 mmol/L — ABNORMAL HIGH (ref 0.0–2.0)
Bicarbonate: 28.4 mmol/L — ABNORMAL HIGH (ref 20.0–28.0)
Calcium, Ion: 1.04 mmol/L — ABNORMAL LOW (ref 1.15–1.40)
HCT: 46 % (ref 39.0–52.0)
Hemoglobin: 15.6 g/dL (ref 13.0–17.0)
O2 Saturation: 96 %
Potassium: 3.7 mmol/L (ref 3.5–5.1)
Sodium: 138 mmol/L (ref 135–145)
TCO2: 30 mmol/L (ref 22–32)
pCO2, Ven: 43.6 mm[Hg] — ABNORMAL LOW (ref 44–60)
pH, Ven: 7.423 (ref 7.25–7.43)
pO2, Ven: 81 mm[Hg] — ABNORMAL HIGH (ref 32–45)

## 2023-09-20 LAB — RESP PANEL BY RT-PCR (RSV, FLU A&B, COVID)  RVPGX2
Influenza A by PCR: NEGATIVE
Influenza B by PCR: NEGATIVE
Resp Syncytial Virus by PCR: NEGATIVE
SARS Coronavirus 2 by RT PCR: NEGATIVE

## 2023-09-20 LAB — GLUCOSE, CAPILLARY: Glucose-Capillary: 286 mg/dL — ABNORMAL HIGH (ref 70–99)

## 2023-09-20 LAB — BRAIN NATRIURETIC PEPTIDE: B Natriuretic Peptide: 27.4 pg/mL (ref 0.0–100.0)

## 2023-09-20 LAB — HIV ANTIBODY (ROUTINE TESTING W REFLEX): HIV Screen 4th Generation wRfx: NONREACTIVE

## 2023-09-20 LAB — CBG MONITORING, ED
Glucose-Capillary: 310 mg/dL — ABNORMAL HIGH (ref 70–99)
Glucose-Capillary: 253 mg/dL — ABNORMAL HIGH (ref 70–99)

## 2023-09-20 LAB — TROPONIN I (HIGH SENSITIVITY)
Troponin I (High Sensitivity): 5 ng/L (ref <18)
Troponin I (High Sensitivity): 4 ng/L (ref <18)

## 2023-09-20 MED ORDER — ENOXAPARIN SODIUM 60 MG/0.6ML IJ SOSY
60.0000 mg | PREFILLED_SYRINGE | INTRAMUSCULAR | Status: DC
  Administered 2023-09-21 – 2023-09-22 (×2): 60 mg via SUBCUTANEOUS
  Filled 2023-09-20 (×2): qty 0.6

## 2023-09-20 MED ORDER — INSULIN GLARGINE-YFGN 100 UNIT/ML ~~LOC~~ SOLN
6.0000 [IU] | Freq: Every day | SUBCUTANEOUS | Status: DC
  Administered 2023-09-20 – 2023-09-21 (×2): 6 [IU] via SUBCUTANEOUS
  Filled 2023-09-20 (×2): qty 0.06

## 2023-09-20 MED ORDER — ACETAMINOPHEN 325 MG PO TABS: 650.0000 mg | ORAL_TABLET | Freq: Four times a day (QID) | ORAL | Status: DC | PRN

## 2023-09-20 MED ORDER — CARVEDILOL 3.125 MG PO TABS
3.1250 mg | ORAL_TABLET | Freq: Two times a day (BID) | ORAL | Status: DC
  Administered 2023-09-20 – 2023-09-22 (×5): 3.125 mg via ORAL
  Filled 2023-09-20 (×5): qty 1

## 2023-09-20 MED ORDER — MELATONIN 5 MG PO TABS
5.0000 mg | ORAL_TABLET | Freq: Every day | ORAL | Status: DC
Start: 2023-09-20 — End: 2023-09-22
  Administered 2023-09-20 – 2023-09-21 (×2): 5 mg via ORAL
  Filled 2023-09-20 (×2): qty 1

## 2023-09-20 MED ORDER — LISINOPRIL 2.5 MG PO TABS
2.5000 mg | ORAL_TABLET | Freq: Two times a day (BID) | ORAL | Status: DC
  Filled 2023-09-20: qty 1

## 2023-09-20 MED ORDER — ACETAMINOPHEN 650 MG RE SUPP: 650.0000 mg | Freq: Four times a day (QID) | RECTAL | Status: DC | PRN

## 2023-09-20 MED ORDER — CITALOPRAM HYDROBROMIDE 20 MG PO TABS
20.0000 mg | ORAL_TABLET | Freq: Every day | ORAL | Status: DC
  Administered 2023-09-20 – 2023-09-22 (×3): 20 mg via ORAL
  Filled 2023-09-20: qty 1
  Filled 2023-09-20: qty 2
  Filled 2023-09-20: qty 1

## 2023-09-20 MED ORDER — PREDNISONE 20 MG PO TABS
40.0000 mg | ORAL_TABLET | Freq: Every day | ORAL | Status: DC
  Administered 2023-09-20 – 2023-09-22 (×3): 40 mg via ORAL
  Filled 2023-09-20 (×3): qty 2

## 2023-09-20 MED ORDER — ATORVASTATIN CALCIUM 80 MG PO TABS
80.0000 mg | ORAL_TABLET | Freq: Every day | ORAL | Status: DC
  Administered 2023-09-20 – 2023-09-21 (×2): 80 mg via ORAL
  Filled 2023-09-20 (×2): qty 1

## 2023-09-20 MED ORDER — MOMETASONE FURO-FORMOTEROL FUM 200-5 MCG/ACT IN AERO
2.0000 | INHALATION_SPRAY | Freq: Two times a day (BID) | RESPIRATORY_TRACT | Status: DC
  Administered 2023-09-20 – 2023-09-22 (×4): 2 via RESPIRATORY_TRACT
  Filled 2023-09-20: qty 8.8

## 2023-09-20 MED ORDER — PRASUGREL HCL 10 MG PO TABS
10.0000 mg | ORAL_TABLET | Freq: Every day | ORAL | Status: DC
  Administered 2023-09-21 – 2023-09-22 (×2): 10 mg via ORAL
  Filled 2023-09-20 (×2): qty 1

## 2023-09-20 MED ORDER — ASPIRIN 81 MG PO CHEW
81.0000 mg | CHEWABLE_TABLET | Freq: Every day | ORAL | Status: DC
  Administered 2023-09-20 – 2023-09-22 (×3): 81 mg via ORAL
  Filled 2023-09-20 (×3): qty 1

## 2023-09-20 MED ORDER — DOXYCYCLINE HYCLATE 100 MG PO TABS
100.0000 mg | ORAL_TABLET | Freq: Two times a day (BID) | ORAL | Status: DC
  Administered 2023-09-20 – 2023-09-22 (×5): 100 mg via ORAL
  Filled 2023-09-20 (×5): qty 1

## 2023-09-20 MED ORDER — ALBUTEROL SULFATE (2.5 MG/3ML) 0.083% IN NEBU: 2.5000 mg | INHALATION_SOLUTION | RESPIRATORY_TRACT | Status: DC | PRN

## 2023-09-20 MED ORDER — INSULIN ASPART 100 UNIT/ML IJ SOLN
0.0000 [IU] | Freq: Three times a day (TID) | INTRAMUSCULAR | Status: DC
  Administered 2023-09-20: 5 [IU] via SUBCUTANEOUS
  Administered 2023-09-20: 7 [IU] via SUBCUTANEOUS
  Administered 2023-09-21: 3 [IU] via SUBCUTANEOUS
  Administered 2023-09-21 (×2): 7 [IU] via SUBCUTANEOUS
  Administered 2023-09-22: 2 [IU] via SUBCUTANEOUS

## 2023-09-20 MED ORDER — ENOXAPARIN SODIUM 40 MG/0.4ML IJ SOSY: 40.0000 mg | PREFILLED_SYRINGE | INTRAMUSCULAR | Status: DC

## 2023-09-20 NOTE — ED Provider Notes (Signed)
 [place] EMERGENCY DEPARTMENT AT [facility] Provider Note   CSN: [id number] Arrival date & time: 09/19/23  2347     History  Chief Complaint  Patient presents with   Respiratory Distress    [name] [name] is a 63 y.o. male.  Patient presents to the emergency department via EMS complaining of shortness of breath.  Patient reports history of COPD with frequent exacerbations.  He states that he began to feel short of breath and began wheezing at approximately 4 PM this afternoon.  He tried albuterol  at home with no relief and called EMS.  EMS administered 2 DuoNeb's, 2 g of magnesium , and 125 mg of Solu-Medrol  during transport.  They report that his room air oxygen saturation was 80% upon their arrival.  The patient does not use oxygen at baseline at home.  Upon arrival he is feeling somewhat better after those treatments.  He is on 6 L/min via nasal cannula during my assessment.  He continues to have decreased air movement with expiratory wheezes throughout lung fields.  He denies chest pain, abdominal pain, nausea, vomiting, fevers.  Past medical history significant for hypertension, COPD, type II DM, hearing difficulty, obstructive sleep apnea on CPAP.  The patient was scheduled to have a left-sided open cavity tympanomastoidectomy today but the procedure was canceled as the patient was not aware of preop medication instructions.  It has been rescheduled for January 15.  HPI     Home Medications Prior to Admission medications   Medication Sig Start Date End Date Taking? Authorizing Provider  albuterol  (VENTOLIN  HFA) 108 (90 Base) MCG/ACT inhaler Inhale 1-2 puffs into the lungs every 6 (six) hours as needed for wheezing or shortness of breath. 09/01/23  Yes Alba Sharper, MD  aspirin  81 MG chewable tablet Chew 1 tablet (81 mg total) by mouth daily. 10/04/13   Fredirick Glenys RAMAN, MD  atorvastatin  (LIPITOR ) 80 MG tablet TAKE 1 TABLET BY MOUTH DAILY Patient taking differently: Take 80  mg by mouth at bedtime. 04/25/23   Jeanelle Layman CROME, MD  carvedilol  (COREG ) 3.125 MG tablet TAKE 1 TABLET BY MOUTH TWICE A DAY WITH A MEAL 05/22/23   Jeanelle Layman CROME, MD  CIPRODEX  OTIC suspension Place 4 drops into both ears 2 (two) times daily. 10/07/21   [provider]  citalopram  (CELEXA ) 20 MG tablet Take 1 tablet (20 mg total) by mouth daily. 07/19/23   Jeanelle Layman CROME, MD  Continuous Glucose Sensor (FREESTYLE LIBRE 2 SENSOR) MISC APPLY SENSOR TO SKIN FOR CONTINUOUS BLOOD SUGAR MONITORING, REPLACE EVERY 14 DAYS 05/29/23   Jeanelle Layman CROME, MD  DROPLET PEN NEEDLES 29G X MISC USE ONCE DAILY WITH VICTOZA  PEN 03/28/23   Delores Suzann HERO, MD  gentamicin  cream (GARAMYCIN ) 0.1 % Apply to right foot once daily. Patient taking differently: Apply 1 Application topically daily as needed (irritation). Apply to right foot once daily. 08/30/22   Gaynel Delon CROME, DPM  glucose blood (ACCU-CHEK GUIDE) test strip Use as instructed 10/11/21   Jeanelle Layman CROME, MD  insulin  glargine (LANTUS  SOLOSTAR) 100 UNIT/ML Solostar Pen Inject 14-16 Units into the skin daily. 06/20/23   Jeanelle Layman CROME, MD  Insulin  Pen Needle (PEN NEEDLES) 32G X 4 MM MISC Use as directed with insulin  four times daily 09/15/21   Jeanelle Layman CROME, MD  JARDIANCE  25 MG TABS tablet TAKE 1 TABLET BY MOUTH DAILY 05/22/23   Jeanelle Layman CROME, MD  lisinopril  (ZESTRIL ) 2.5 MG tablet TAKE 1 TABLET BY MOUTH  TWICE A DAY 08/18/23   Jeanelle Layman CROME, MD  Melatonin 2.5 MG CAPS Take 5 mg by mouth at bedtime.    [provider]  meloxicam  (MOBIC ) 7.5 MG tablet Take 1 tablet (7.5 mg total) by mouth daily. Patient not taking: Reported on 07/19/2023 06/12/23   McCaughan, Dia D, DPM  metFORMIN  (GLUCOPHAGE ) 1000 MG tablet TAKE ONE TABLET BY MOUTH TWICE A DAY WITH MEALS 01/25/23   Jeanelle Layman CROME, MD  mometasone -formoterol  (DULERA ) 200-5 MCG/ACT AERO Inhale 2 puffs into the lungs in the morning and at  bedtime. 05/16/23   Jeanelle Layman CROME, MD  Multiple Vitamin (MULTIVITAMIN) tablet Take 1 tablet by mouth daily.    [provider]  nitroGLYCERIN  (NITROSTAT ) 0.4 MG SL tablet DISSOLVE 1 TABLET UNDER THE TONGUE FOR CHEST PAIN. IF PAIN REMAINS AFTER 5 MINUTES, CALL 911 AND REPEAT DOSE. MAX 3 TABLETS IN 15 MINUTES 11/01/21   Chambliss, Layman CROME, MD  NON FORMULARY Pt uses a c-pap nightly    [provider]  Omega-3 Fatty Acids  (FISH OIL ) 1000 MG CAPS Take 1 capsule (1,000 mg total) by mouth daily. 08/01/19   Scarlet Elsie LABOR, MD  prasugrel  (EFFIENT ) 10 MG TABS tablet Take 1 tablet (10 mg total) by mouth daily. 10/17/22   Jordan, Peter M, MD  Semaglutide , 2 MG/DOSE, (OZEMPIC , 2 MG/DOSE,) 8 MG/3ML SOPN Inject 2 mg into the skin once a week. Patient taking differently: Inject 2 mg into the skin once a week. Monday 11/22/22   Jeanelle Layman CROME, MD  traMADol  (ULTRAM ) 50 MG tablet TAKE 1 TABLET BY MOUTH EVERY 6 HOURS AS NEEDED Patient not taking: Reported on 07/19/2023 01/26/23   Jeanelle Layman CROME, MD      Allergies    Shellfish allergy    Review of Systems   Review of Systems  Physical Exam Updated Vital Signs BP (!) 137/56   Pulse (!) 103   Temp 98.4 F (36.9 C) (Oral)   Resp (!) 21   Ht 5' 7 (1.702 m)   Wt 120.2 kg   SpO2 94%   BMI 41.50 kg/m  Physical Exam Vitals and nursing note reviewed.  Constitutional:      General: He is not in acute distress.    Appearance: He is well-developed. He is obese.  HENT:     Head: Normocephalic and atraumatic.  Eyes:     Conjunctiva/sclera: Conjunctivae normal.  Cardiovascular:     Rate and Rhythm: Normal rate and regular rhythm.  Pulmonary:     Effort: Pulmonary effort is normal. No respiratory distress.     Breath sounds: Examination of the right-upper field reveals wheezing. Examination of the left-upper field reveals wheezing. Examination of the right-middle field reveals wheezing. Examination of the left-middle  field reveals wheezing. Examination of the right-lower field reveals decreased breath sounds and wheezing. Examination of the left-lower field reveals decreased breath sounds and wheezing. Decreased breath sounds and wheezing present.  Abdominal:     Palpations: Abdomen is soft.     Tenderness: There is no abdominal tenderness.  Musculoskeletal:        General: No swelling.     Cervical back: Neck supple.     Right lower leg: No edema.     Left lower leg: No edema.  Skin:    General: Skin is warm and dry.     Capillary Refill: Capillary refill takes less than 2 seconds.  Neurological:     Mental Status: He is alert.  Psychiatric:  Mood and Affect: Mood normal.     ED Results / Procedures / Treatments   Labs (all labs ordered are listed, but only abnormal results are displayed) Labs Reviewed  BASIC METABOLIC PANEL - Abnormal; Notable for the following components:      Result Value   Glucose, Bld 192 (*)    All other components within normal limits  CBC WITH DIFFERENTIAL/PLATELET - Abnormal; Notable for the following components:   WBC 12.6 (*)    Neutro Abs 11.7 (*)    All other components within normal limits  I-STAT VENOUS BLOOD GAS, ED - Abnormal; Notable for the following components:   pCO2, Ven 43.6 (*)    pO2, Ven 81 (*)    Bicarbonate 28.4 (*)    Acid-Base Excess 3.0 (*)    Calcium , Ion 1.04 (*)    All other components within normal limits  RESP PANEL BY RT-PCR (RSV, FLU A&B, COVID)  RVPGX2  BRAIN NATRIURETIC PEPTIDE  CBC WITH DIFFERENTIAL/PLATELET    EKG EKG Interpretation Date/Time:  Tuesday September 19 2023 23:55:08 EST Ventricular Rate:  95 PR Interval:  190 QRS Duration:  89 QT Interval:  360 QTC Calculation: 453 R Axis:   -49  Text Interpretation: Sinus rhythm Ventricular premature complex Aberrant conduction of SV complex(es) Left anterior fascicular block Confirmed by Theadore Sharper [phone]) on 09/20/2023 12:51:06 AM  Radiology DG Chest Portable 1  View Result Date: 09/20/2023 CLINICAL DATA:  Shortness of breath EXAM: PORTABLE CHEST 1 VIEW COMPARISON:  07/20/2023 FINDINGS: Stable cardiomediastinal silhouette. No focal consolidation, pleural effusion, or pneumothorax. No displaced rib fractures. IMPRESSION: No active disease. Electronically Signed   By: Norman Gatlin M.D.   On: 09/20/2023 00:26    Procedures .Critical Care  Performed by: Logan Ubaldo NOVAK, PA-C Authorized by: Logan Ubaldo NOVAK, PA-C   Critical care provider statement:    Critical care time (minutes):  30   Critical care was necessary to treat or prevent imminent or life-threatening deterioration of the following conditions:  Respiratory failure   Critical care was time spent personally by me on the following activities:  Development of treatment plan with patient or surrogate, discussions with consultants, evaluation of patient's response to treatment, examination of patient, ordering and review of laboratory studies, ordering and review of radiographic studies, ordering and performing treatments and interventions, pulse oximetry, re-evaluation of patient's condition and review of old charts   Care discussed with: admitting provider       Medications Ordered in ED Medications  albuterol  (VENTOLIN  HFA) 108 (90 Base) MCG/ACT inhaler 2 puff (has no administration in time range)  ipratropium-albuterol  (DUONEB) 0.5-2.5 (3) MG/3ML nebulizer solution 3 mL (3 mLs Nebulization Given 09/20/23 0011)    ED Course/ Medical Decision Making/ A&P                                 Medical Decision Making Amount and/or Complexity of Data Reviewed Labs: ordered. Radiology: ordered.  Risk Prescription drug management.   This patient presents to the ED for concern of shortness of breath, this involves an extensive number of treatment options, and is a complaint that carries with it a high risk of complications and morbidity.  The differential diagnosis includes COPD exacerbation,  fluid overload, infection, pneumonia, others   Co morbidities that complicate the patient evaluation  COPD   Additional history obtained:  Additional history obtained from EMS External records from outside source obtained and  reviewed including previous discharge summary   Lab Tests:  I Ordered, and personally interpreted labs.  The pertinent results include: Negative respiratory panel, BNP 27.4, mild leukocytosis with white count of 12,600, VBG showing pCO2 43.6, pO2 81, HCO3 28.4   Imaging Studies ordered:  I ordered imaging studies including chest x-ray I independently visualized and interpreted imaging which showed no active disease I agree with the radiologist interpretation   Cardiac Monitoring: / EKG:  The patient was maintained on a cardiac monitor.  I personally viewed and interpreted the cardiac monitored which showed an underlying rhythm of: Sinus rhythm   Consultations Obtained:  I requested consultation with the hospitalist, Dr.Gardner,  and discussed lab and imaging findings as well as pertinent plan - they recommend: admission by day team   Problem List / ED Course / Critical interventions / Medication management   I ordered medication including DuoNeb for wheezing/shortness of breath Reevaluation of the patient after these medicines showed that the patient stayed the same I have reviewed the patients home medicines and have made adjustments as needed   Test / Admission - Considered:  Patient arrives to the emergency department in respiratory distress which worsened to the point of requiring BiPAP for respiratory support.  Patient tolerating BiPAP well.  Needs admission for further management of what appears to be a COPD exacerbation.         Final Clinical Impression(s) / ED Diagnoses Final diagnoses:  Acute respiratory failure with hypoxia [facility])    Rx / DC Orders ED Discharge Orders     None         Logan Ubaldo KATHEE DEVONNA 09/20/23 9567    Theadore Ozell HERO, MD 09/20/23 [phone]

## 2023-09-20 NOTE — ED Notes (Signed)
 RT called regarding BiPAP order.

## 2023-09-20 NOTE — Progress Notes (Signed)
 During chart review discovered that his PCP is with the [facility] under Dr Deirdre Priest. I spoke with EDP/Bero who will notify the [facility] regarding this admisison

## 2023-09-20 NOTE — Hospital Course (Addendum)
 [name] [name] is a 63 y.o.male with a history of HTN, CAD, OSA, T2DM who was admitted to the [facility] Medicine Teaching Service at [facility] for acute hypoxic respiratory failure. His hospital course is detailed below:  Acute Hypoxic Respiratory Failure Thought to be in the setting of uncontrolled GERD/CPAP settings.  Pulmonology consulted and advised starting PPI twice daily, outpatient esophagram and modified barium swallow study, and follow-up with them in their office for CPAP setting adjustment.  Patient was weaned to room air prior to discharge and was able to ambulate without difficulty. Was initially started on prednisone  40 mg x 5 days and doxycycline  x 5 days for presumed COPD exacerbation, however upon further chart review pulmonology does not suspect that he actually has COPD based off previous spirometry.  He received 3 days of both medications and they were discontinued on discharge.   Other chronic conditions were medically managed with home medications and formulary alternatives as necessary (HTN, CAD, OSA)  PCP Follow-up Recommendations: Needs pulmonology follow-up F/u esophagram results (completed in hospital, pending read) Was supposed to get oxygen prior to discharge for PRN at home but did not qualify to be sent home with it - may need new DME order if still requiring

## 2023-09-20 NOTE — H&P (Addendum)
 Hospital Admission History and Physical Service Pager: [phone]  Patient name: [name] [name] Medical record number: [id number] Date of Birth: 1961-01-28 Age: 63 y.o. Gender: male  Primary Care Provider: Jeanelle Layman CROME, MD Consultants: None  Code Status: FULL Preferred Emergency Contact:  Contact Information     Name Relation Home Work [name] [name] Sister [phone]     [name],[name] Mother   [phone]   [name],[name] Relative   [phone]      Other Contacts     Name Relation Home Work Mobile   [name],[name] Sister   [phone]       Chief Complaint: Dyspnea   Assessment and Plan: [name] [name] is a 63 y.o. male presenting with dyspnea. Differential for presentation of this includes:   Differential for dyspnea and acute hypoxic respiratory failure: COPD exacerbation: Seems more likely given history and progression of symptoms  PNA: No infectious symptoms or consolidation on CXR CHF: Unlikely, BNP negative and LVEF 60-65% on echo from 2022  Progression of OSA: possible contributor Cardiac: EKG without acute changes, no chest pain, H/o BMS RCA 01/20/12; NSTEMI, s/p BMS mid CX and BMS OM2 11/08/13; DES spanning across proximal CX through BMS of OM2 08/01/14-trops ordered PE: Not as likely, wells score is low (0) without history of embolism in the past.   Of note, patient has had suspected COPD exacerbations of somewhat sudden onset over the past several months but none prior to July 2024. Puzzling picture as patient is a poor historian, previous PFTs showed mild restrictive disease. Extensive ddx discussed with pcp although unclear. Will run case by pulmonology who already sees patient outpatient to see if his symptoms and history warrant further imaging (CTA negative 03/2023).  Assessment & Plan Acute hypoxic respiratory failure (HCC) Documented to 86%, required bipap shortly. Will treat with steroid burst and abx as indicated. Recently received azithro for COPD  exacerbation 09/01/23. Given recent abx, would consider pseudomonas coverage but hold for now.  - Admit to [facility], Attending Dr. Jeanelle  - Prednisone  40mg  qd x5d - Doxy x5 days - oxygen saturation = 88%-92% - Duonebs q4hr - Continue home inhalers Dulera  at formulary alternative if necessary  - VS per floor protocol  - Cardiac monitoring, continuous pulse ox   - DVT ppx   Chronic and Stable Problems:  HTN - Coreg , Lisinopril   CAD - ASA, Effient   OSA - needs cpap at night  TDM2 - CBGs w/ meals and at night, on Ozempic  + Metformin  + Lantus  14-16 U + Jardiance . Start LAI 6U and increase as needed    FEN/GI: Regular  VTE Prophylaxis: Lovenox    Disposition: Med Tele   History of Present Illness:  [name] [name] is a 63 y.o. male presenting with dyspnea and hypoxia.  This began approximately 1 [name] ago, patient was unable to ambulate for any real period of time.  Patient reports worsening sputum production, changing color to yellow coloration, worsening shortness of breath.  He has been treated recently for COPD exacerbation but does report that he is asthma.  Notes that he is able to lay flat on his back easily.  However, is concerned about the worsening shortness of breath over time despite home breathing treatments as they only cause temporary relief.  No fevers or chills.  Patient seen in clinic on 09/01/23 for dyspnea that had been worsening over past few days. COPD exacerbation was treated with azithro and prednisone  at that time.   In the ED, BMP unremarkable, ISTAT  pCO2 43/nml pH, WBC 12.6, CXR without focal consolidation. On Bipap for 2 hours overnight and on nonrebreather presently   Review Of Systems: Per HPI with the following additions: No nausea or vomiting   Pertinent Past Medical History: HTN CAD OSA TDM2 Remainder reviewed in history tab.   Pertinent Past Surgical History: Cardiac stents (see above)   Remainder reviewed in history tab.  Pertinent Social  History: Tobacco use: Former - stopped 3 years ago  Alcohol use: Former - stopped 3 years ago  Other Substance use: No  Pertinent Family History: HTN DM2  Stroke   Remainder reviewed in history tab.   Important Outpatient Medications: Albuterol   ASA Lipitor   Coreg   Celexa   Lantus  14U Jardiance   Lisinopril   Melatonin  Metformin   MVM Nitroglycerin   Ozempic    Remainder reviewed in medication history.   Objective: BP (!) 133/95 (BP Location: Right Arm)   Pulse 86   Temp 98.2 F (36.8 C) (Oral)   Resp (!) 22   Ht 5' 7 (1.702 m)   Wt 120.2 kg   SpO2 97%   BMI 41.50 kg/m  Exam: General: NAD, nontoxic  Eyes: EOMI ENTM: normal nares and external ears  Neck: No LAD Cardiovascular: RRR Respiratory: Coarse breath sounds, faint crackles b/l bases  Gastrointestinal: NTTP, nondistended  MSK: Moving ext, no edema  Derm: No rash Neuro: Neurologically intact  Psych: Normal affect and mood   Labs:  CBC BMET  Recent Labs  Lab 09/20/23 0308  WBC 12.6*  HGB 13.4  HCT 39.9  PLT 230   Recent Labs  Lab 09/19/23 2350 09/20/23 0034  NA 136 138  K 3.6 3.7  CL 100  --   CO2 23  --   BUN 12  --   CREATININE 0.74  --   GLUCOSE 192*  --   CALCIUM  9.1  --      EKG:  Sinus, regular, no acute changes   Imaging Studies Performed:  Imaging Study (ie. Chest x-ray) Impression from Radiologist: No active disease    My Interpretation: Slight vascular congestion   Bryan Bianchi, MD 09/20/2023, 7:09 AM PGY-3, [name]  FPTS Intern pager: [phone], text pages welcome Secure chat group [name] Teaching Service

## 2023-09-20 NOTE — Inpatient Diabetes Management (Signed)
 Inpatient Diabetes Program Recommendations  AACE/ADA: New Consensus Statement on Inpatient Glycemic Control (2015)  Target Ranges:  Prepandial:   less than 140 mg/dL      Peak postprandial:   less than 180 mg/dL (1-2 hours)      Critically ill patients:  140 - 180 mg/dL   Lab Results  Component Value Date   GLUCAP 310 (H) 09/20/2023   HGBA1C 7.9 (A) 07/19/2023    Review of Glycemic Control  Diabetes history: DM2 Outpatient Diabetes medications: Lantus  14-16 units daily, Farxiga 25 mg daily, metformin  1000 mg BID, Ozempic  2 mg weekly. Has FS Libre Current orders for Inpatient glycemic control: Semglee  6 units daily, Novolog  0-9 TID with meals  On Prednisone  40 with breakfast HgbA1C - 7.9%  Inpatient Diabetes Program Recommendations:    Consider increasing Semglee  to 10 units every day  Consider adding Novolog  4 units TID with meals if eating > 50%.  Add Novolog  0-5 HS while on steroids.  Continue to follow glucose trends.  Thank you. Shona Brandy, RD, LDN, CDCES Inpatient Diabetes Coordinator [phone]

## 2023-09-20 NOTE — Evaluation (Signed)
 Occupational Therapy Evaluation Patient Details Name: [name] [name] MRN: [id number] DOB: 1961-02-04 Today's Date: 09/20/2023   History of Present Illness [name] [name] is a 63 y/o male admitted 09/19/23 due to dyspnea and hypoxia as well as decreased mobility. Seen in clinic on 09/01/23 for dyspnea that had been worsening over past few days. COPD exacerbation was treated with azithro and prednisone  at that time.  PMHx significant for COPD, OSA on CPAP, HLD, HTN, CAD, depression, DMII, tobacco use disorder, CAD and Hx of NSTEMI.   Clinical Impression   Pt typically lives with his sister, he does not drive, but ambulates without DME, and does his own ADL - he assist with household cleaning around the town home. His bedroom/bathroom is upstairs. Today Pt reports feeling much better since breathing treatments, he is a mouth-breather and so utilized a NRB during our session and maintained SpO2 >90% on 1.5 L supplemental O2. He was able to demonstrate UB and LB ADL this session as well as limited walking and bouts of marching in the room. He was supervision for all aspects of bed mobility from the ED stretcher. OT will follow acutely to focus on energy conservation education during ADL and ensure maximum independence in ADL and functional transfers.        If plan is discharge home, recommend the following: A little help with walking and/or transfers;A little help with bathing/dressing/bathroom;Assist for transportation;Assistance with cooking/housework    Functional Status Assessment  Patient has had a recent decline in their functional status and demonstrates the ability to make significant improvements in function in a reasonable and predictable amount of time.  Equipment Recommendations  None recommended by OT    Recommendations for Other Services PT consult     Precautions / Restrictions Precautions Precautions: Fall Precaution Comments: watch SpO2 Restrictions Weight Bearing Restrictions Per  Provider Order: No      Mobility Bed Mobility Overal bed mobility: Needs Assistance Bed Mobility: Supine to Sit, Sit to Supine     Supine to sit: Supervision Sit to supine: Supervision   General bed mobility comments: assist for line management only, no physical assist    Transfers Overall transfer level: Needs assistance Equipment used: None Transfers: Sit to/from Stand Sit to Stand: Contact guard assist, From elevated surface (ED stretcher)           General transfer comment: Pt able to get on and off ED stretcher with CGA for safety - no physical assist      Balance Overall balance assessment: Mild deficits observed, not formally tested                                         ADL either performed or assessed with clinical judgement   ADL Overall ADL's : At baseline                                       General ADL Comments: Pt able to don/doff socks, take steps back and forth with supplemental O2 via NRB (Pt is mouth breather) no family present to confirm     Vision Baseline Vision/History: 0 No visual deficits Ability to See in Adequate Light: 0 Adequate Vision Assessment?: No apparent visual deficits     Perception         Praxis  Pertinent Vitals/Pain Pain Assessment Pain Assessment: No/denies pain     Extremity/Trunk Assessment Upper Extremity Assessment Upper Extremity Assessment: Overall WFL for tasks assessed   Lower Extremity Assessment Lower Extremity Assessment: Defer to PT evaluation   Cervical / Trunk Assessment Cervical / Trunk Assessment: Other exceptions Cervical / Trunk Exceptions: obese   Communication Communication Communication: No apparent difficulties   Cognition Arousal: Alert Behavior During Therapy: WFL for tasks assessed/performed Overall Cognitive Status: No family/caregiver present to determine baseline cognitive functioning                                  General Comments: Pt very pleasant and cooperative. Mild problem solving deficits (suspect baseline?) Pt states that his sisters will not come into hospital while he is here because they both work     General Comments  Pt on 1.5 L O2 via NRB throughout session. Pt was able to maintain >90% even with marching in place, bed mobility etc.    Exercises Exercises: Other exercises Other Exercises Other Exercises: marching x20 - 3 rounds   Shoulder Instructions      Home Living Family/patient expects to be discharged to:: Private residence Living Arrangements: Other relatives (sister) Available Help at Discharge: Family;Available PRN/intermittently Type of Home: Other(Comment) (townhouse) Home Access: Stairs to enter [employer] of Steps: 5 Entrance Stairs-Rails: Right;Left;Can reach both Home Layout: Two level;Bed/bath upstairs Alternate Level Stairs-Number of Steps: 12 Alternate Level Stairs-Rails: Right;Left Bathroom Shower/Tub: Tub/shower unit;Curtain   Bathroom Toilet: Standard Bathroom Accessibility: Yes How Accessible: Accessible via walker Home Equipment: None          Prior Functioning/Environment Prior Level of Function : Independent/Modified Independent             Mobility Comments: Pt uses no AD for functional mobility ADLs Comments: independent. Sister typically completes grocery shopping and cooking, Pt participates in cleaning activities around the townhome. Pt reports that he has  friend that is a cab driver and he will provide transportation also.        OT Problem List: Decreased activity tolerance;Decreased safety awareness;Cardiopulmonary status limiting activity;Obesity      OT Treatment/Interventions: Self-care/ADL training;Energy conservation;Therapeutic activities;Patient/family education    OT Goals(Current goals can be found in the care plan section) Acute Rehab OT Goals Patient Stated Goal: breathe better OT Goal Formulation:  With patient Time For Goal Achievement: 10/04/23 Potential to Achieve Goals: Good ADL Goals Pt Will Perform Grooming: with modified independence;standing Pt Will Perform Upper Body Dressing: with modified independence;sitting Pt Will Perform Lower Body Dressing: with modified independence;sit to/from stand Pt Will Transfer to Toilet: with modified independence;ambulating Pt Will Perform Toileting - Clothing Manipulation and hygiene: with modified independence;sit to/from stand Additional ADL Goal #1: Pt will verbalize at least 3 ways of conserving energy during ADL routine with no cues  OT Frequency: Min 1X/week    Co-evaluation              AM-PAC OT 6 Clicks Daily Activity     Outcome Measure Help from another person eating meals?: None Help from another person taking care of personal grooming?: None Help from another person toileting, which includes using toliet, bedpan, or urinal?: None Help from another person bathing (including washing, rinsing, drying)?: A Little Help from another person to put on and taking off regular upper body clothing?: None Help from another person to put on and taking off regular lower body clothing?: None  6 Click Score: 23   End of Session Equipment Utilized During Treatment: Gait belt;Oxygen (1.5 L Via NRB) Nurse Communication: Mobility status;Precautions  Activity Tolerance: Patient tolerated treatment well Patient left: Other (comment) (ED stretcher, all rails up)  OT Visit Diagnosis: Other abnormalities of gait and mobility (R26.89);Muscle weakness (generalized) (M62.81);Other (comment) (cardiopulmonary status limiting performance)                Time: 8792-8767 OT Time Calculation (min): 25 min Charges:  OT General Charges $OT Visit: 1 Visit OT Evaluation $OT Eval Low Complexity: 1 Low OT Treatments $Self Care/Home Management : 8-22 mins  Leita DEL OTR/L Acute Rehabilitation Services Office: [phone]  Leita PARAS  [facility] 09/20/2023, 1:55 PM

## 2023-09-20 NOTE — Assessment & Plan Note (Signed)
 Documented to 86%, required bipap shortly. Will treat with steroid burst and abx as indicated. Recently received azithro for COPD exacerbation 09/01/23. Given recent abx, would consider pseudomonas coverage but hold for now.  - Admit to FPTS, Attending Dr. Jeanelle  - Prednisone  40mg  qd x5d - Doxy x5 days - oxygen saturation = 88%-92% - Duonebs q4hr - Continue home inhalers Dulera  at formulary alternative if necessary  - VS per floor protocol  - Cardiac monitoring, continuous pulse ox   - DVT ppx

## 2023-09-20 NOTE — ED Notes (Signed)
 Pt received lunch tray

## 2023-09-20 NOTE — ED Notes (Signed)
 ED TO INPATIENT HANDOFF REPORT  ED Nurse Name and Phone #: Dorthea PEAK [phone]  S Name/Age/Gender [name] [name] 63 y.o. male Room/Bed: 038C/038C  Code Status   Code Status: Full Code  Home/SNF/Other Home Patient oriented to: self, place, time, and situation Is this baseline? Yes   Triage Complete: Triage complete  Chief Complaint Acute hypoxic respiratory failure (HCC) [J96.01]  Triage Note Pt arrived from home via [facility] resp distress. Pt received in enroute 2 duo nebs, 2g mg, and 125mg  solumedrol. Pt was 80% room air per ems upon arrival. Denies chest pain   Allergies Allergies  Allergen Reactions   Shellfish Allergy Nausea And Vomiting    OYSTERS    Level of Care/Admitting Diagnosis ED Disposition     ED Disposition  Admit   Condition  --   Comment  Hospital Area: [facility] [[id number]]  Level of Care: Telemetry Medical [104]  May admit patient to [facility] or [facility] if equivalent level of care is available:: Yes  Covid Evaluation: Asymptomatic - no recent exposure (last 10 days) testing not required  Diagnosis: Acute hypoxic respiratory failure [facility]) [[id number]]  Admitting Physician: BRYAN BIANCHI [[id number]]  Attending Physician: JEANELLE LAYMAN CROME [phone]  Certification:: I certify this patient will need inpatient services for at least 2 midnights  Expected Medical Readiness: 09/25/2023          B Medical/Surgery History Past Medical History:  Diagnosis Date   Alcoholism (HCC)    Anxiety    Arthritis    knees   Asthma    CAD S/P percutaneous coronary angioplasty 01/18/12; 10/2013   a. pRCA 3.5 x 18 vision BMS - 4.2 mm; b. 2/'15: mCx 3.5 x 12  Rebel BMS (3.6-3.7 mm)   Chronic back pain    mid/lower (08/01/2014)   COPD (chronic obstructive pulmonary disease) (HCC)    I'm seeing COPD dr now; don't know if I've got it (08/01/2014)   Depression    Dyspnea    GERD (gastroesophageal reflux disease)    Hyperlipidemia     Hypertension    Migraines    once in awhile (08/01/2014); no current problems as of 09/18/23 per pt.   Non-ST elevation myocardial infarction (NSTEMI) (HCC) 10/2013   OSA on CPAP 01/19/2012   Pneumonia    Sleep apnea    uses CPAP nightly   Type II diabetes mellitus [facility])    Past Surgical History:  Procedure Laterality Date   CARDIAC CATHETERIZATION  07/2014   Left Main: Short, large-caliber vessel. Widely patent. Bifurcates into the LAD and Circumflex. Angiographically normal.   CORONARY ANGIOPLASTY WITH STENT PLACEMENT  01/2012; 11/07/2013; 08/01/2014   1 + 2 + 1    CORONARY ANGIOPLASTY WITH STENT PLACEMENT  07/2014   Severe single-vessel disease involving the bifurcation of OM1 and OM 2 with 99% in-stent restenosis in the bare-metal stent placed in the OM 2.   LEFT HEART CATHETERIZATION WITH CORONARY ANGIOGRAM N/A 01/20/2012   Procedure: LEFT HEART CATHETERIZATION WITH CORONARY ANGIOGRAM;  Surgeon: Candyce GORMAN Reek, MD;  Location: [facility] CATH LAB;  Service: Cardiovascular;  Laterality: N/A;   LEFT HEART CATHETERIZATION WITH CORONARY ANGIOGRAM N/A 11/07/2013   Procedure: LEFT HEART CATHETERIZATION WITH CORONARY ANGIOGRAM;  Surgeon: Candyce GORMAN Reek, MD;  Location: [facility] CATH LAB;  Service: Cardiovascular;  Laterality: N/A;   LEFT HEART CATHETERIZATION WITH CORONARY ANGIOGRAM N/A 08/01/2014   Procedure: LEFT HEART CATHETERIZATION WITH CORONARY ANGIOGRAM;  Surgeon: [name] LELON Clay, MD;  Location: [facility] CATH LAB;  Service: Cardiovascular;  Laterality: N/A;   MULTIPLE TOOTH EXTRACTIONS     PERCUTANEOUS CORONARY STENT INTERVENTION (PCI-S)  01/20/2012   Procedure: PERCUTANEOUS CORONARY STENT INTERVENTION (PCI-S);  Surgeon: Candyce GORMAN Reek, MD;  Location: [facility] CATH LAB;  Service: Cardiovascular;;   PERCUTANEOUS CORONARY STENT INTERVENTION (PCI-S)  11/07/2013   Procedure: PERCUTANEOUS CORONARY STENT INTERVENTION (PCI-S);  Surgeon: Candyce GORMAN Reek, MD;  Location: [facility] CATH LAB;  Service:  Cardiovascular;;     A IV Location/Drains/Wounds Patient Lines/Drains/Airways Status     Active Line/Drains/Airways     Name Placement date Placement time Site Days   Peripheral IV 07/19/23 20 G Left Antecubital 07/19/23  1554  Antecubital  63            Intake/Output Last 24 hours  Intake/Output Summary (Last 24 hours) at 09/20/2023 1607 Last data filed at 09/20/2023 0817 Gross per 24 hour  Intake --  Output 800 ml  Net -800 ml    Labs/Imaging Results for orders placed or performed during the hospital encounter of 09/19/23 (from the past 48 hours)  Basic metabolic panel     Status: Abnormal   Collection Time: 09/19/23 11:50 PM  Result Value Ref Range   Sodium 136 135 - 145 mmol/L   Potassium 3.6 3.5 - 5.1 mmol/L   Chloride 100 98 - 111 mmol/L   CO2 23 22 - 32 mmol/L   Glucose, Bld 192 (H) 70 - 99 mg/dL    Comment: Glucose reference range applies only to samples taken after fasting for at least 8 hours.   BUN 12 8 - 23 mg/dL   Creatinine, Ser 9.25 0.61 - 1.24 mg/dL   Calcium  9.1 8.9 - 10.3 mg/dL   GFR, Estimated >39 >39 mL/min    Comment: (NOTE) Calculated using the CKD-EPI Creatinine Equation (2021)    Anion gap 13 5 - 15    Comment: Performed at [facility], [address], [place], [place] [address]  Resp panel by RT-PCR (RSV, Flu A&B, Covid) Anterior Nasal Swab     Status: None   Collection Time: 09/19/23 11:50 PM   Specimen: Anterior Nasal Swab  Result Value Ref Range   SARS Coronavirus 2 by RT PCR NEGATIVE NEGATIVE   Influenza A by PCR NEGATIVE NEGATIVE   Influenza B by PCR NEGATIVE NEGATIVE    Comment: (NOTE) The Xpert Xpress SARS-CoV-2/FLU/RSV plus assay is intended as an aid in the diagnosis of influenza from Nasopharyngeal swab specimens and should not be used as a sole basis for treatment. Nasal washings and aspirates are unacceptable for Xpert Xpress SARS-CoV-2/FLU/RSV testing.  Fact Sheet for  Patients: bloggercourse.com  Fact Sheet for Healthcare Providers: seriousbroker.it  This test is not yet approved or cleared by the [place]  FDA and has been authorized for detection and/or diagnosis of SARS-CoV-2 by FDA under an Emergency Use Authorization (EUA). This EUA will remain in effect (meaning this test can be used) for the duration of the COVID-19 declaration under Section 564(b)(1) of the Act, 21 U.S.C. section 360bbb-3(b)(1), unless the authorization is terminated or revoked.     Resp Syncytial Virus by PCR NEGATIVE NEGATIVE    Comment: (NOTE) Fact Sheet for Patients: bloggercourse.com  Fact Sheet for Healthcare Providers: seriousbroker.it  This test is not yet approved or cleared by the [place]  FDA and has been authorized for detection and/or diagnosis of SARS-CoV-2 by FDA under an Emergency Use Authorization (EUA). This EUA will remain in effect (meaning this test can be used)  for the duration of the COVID-19 declaration under Section 564(b)(1) of the Act, 21 U.S.C. section 360bbb-3(b)(1), unless the authorization is terminated or revoked.  Performed at [facility] Lab, [address] [place], [place] [address]   Brain natriuretic peptide     Status: None   Collection Time: 09/19/23 11:58 PM  Result Value Ref Range   B Natriuretic Peptide 27.4 0.0 - 100.0 pg/mL    Comment: Performed at [facility] Lab, [address] [place], [place] [address]  I-Stat venous blood gas, [facility] ED, [facility], [facility])     Status: Abnormal   Collection Time: 09/20/23 12:34 AM  Result Value Ref Range   pH, Ven 7.423 7.25 - 7.43   pCO2, Ven 43.6 (L) 44 - 60 mmHg   pO2, Ven 81 (H) 32 - 45 mmHg   Bicarbonate 28.4 (H) 20.0 - 28.0 mmol/L   TCO2 30 22 - 32 mmol/L   O2 Saturation 96 %   Acid-Base Excess 3.0 (H) 0.0 - 2.0 mmol/L   Sodium 138 135 - 145 mmol/L   Potassium 3.7 3.5  - 5.1 mmol/L   Calcium , Ion 1.04 (L) 1.15 - 1.40 mmol/L   HCT 46.0 39.0 - 52.0 %   Hemoglobin 15.6 13.0 - 17.0 g/dL   Sample type VENOUS   CBC with Differential/Platelet     Status: Abnormal   Collection Time: 09/20/23  3:08 AM  Result Value Ref Range   WBC 12.6 (H) 4.0 - 10.5 K/uL   RBC 4.56 4.22 - 5.81 MIL/uL   Hemoglobin 13.4 13.0 - 17.0 g/dL   HCT 60.0 60.9 - 47.9 %   MCV 87.5 80.0 - 100.0 fL   MCH 29.4 26.0 - 34.0 pg   MCHC 33.6 30.0 - 36.0 g/dL   RDW 85.3 88.4 - 84.4 %   Platelets 230 150 - 400 K/uL   nRBC 0.0 0.0 - 0.2 %   Neutrophils Relative % 93 %   Neutro Abs 11.7 (H) 1.7 - 7.7 K/uL   Lymphocytes Relative 5 %   Lymphs Abs 0.7 0.7 - 4.0 K/uL   Monocytes Relative 1 %   Monocytes Absolute 0.2 0.1 - 1.0 K/uL   Eosinophils Relative 0 %   Eosinophils Absolute 0.0 0.0 - 0.5 K/uL   Basophils Relative 0 %   Basophils Absolute 0.1 0.0 - 0.1 K/uL   Immature Granulocytes 1 %   Abs Immature Granulocytes 0.07 0.00 - 0.07 K/uL    Comment: Performed at [facility], [address], [place], [place] [address]  Troponin I (High Sensitivity)     Status: None   Collection Time: 09/20/23  8:15 AM  Result Value Ref Range   Troponin I (High Sensitivity) 5 <18 ng/L    Comment: (NOTE) Elevated high sensitivity troponin I (hsTnI) values and significant  changes across serial measurements may suggest ACS but many other  chronic and acute conditions are known to elevate hsTnI results.  Refer to the Links section for chest pain algorithms and additional  guidance. Performed at [facility], [address], [place], [place] [address]   CBG monitoring, ED     Status: Abnormal   Collection Time: 09/20/23  2:30 PM  Result Value Ref Range   Glucose-Capillary 310 (H) 70 - 99 mg/dL    Comment: Glucose reference range applies only to samples taken after fasting for at least 8 hours.  Troponin I (High Sensitivity)     Status: None  Collection Time: 09/20/23  2:58 PM  Result  Value Ref Range   Troponin I (High Sensitivity) 4 <18 ng/L    Comment: (NOTE) Elevated high sensitivity troponin I (hsTnI) values and significant  changes across serial measurements may suggest ACS but many other  chronic and acute conditions are known to elevate hsTnI results.  Refer to the Links section for chest pain algorithms and additional  guidance. Performed at [facility], [address] [place], [place] [address]    DG Chest Portable 1 View Result Date: 09/20/2023 CLINICAL DATA:  Shortness of breath EXAM: PORTABLE CHEST 1 VIEW COMPARISON:  07/20/2023 FINDINGS: Stable cardiomediastinal silhouette. No focal consolidation, pleural effusion, or pneumothorax. No displaced rib fractures. IMPRESSION: No active disease. Electronically Signed   By: Norman Gatlin M.D.   On: 09/20/2023 00:26    Pending Labs Unresulted Labs (From admission, onward)     Start     Ordered   09/21/23 0500  Basic metabolic panel  Tomorrow morning,   R        09/20/23 0820   09/21/23 0500  CBC  Tomorrow morning,   R        09/20/23 0820   09/20/23 0829  HIV Antibody (routine testing w rflx)  (HIV Antibody (Routine testing w reflex) panel)  Once,   R        09/20/23 0832   09/19/23 2354  CBC with Differential  Once,   STAT        09/19/23 2355            Vitals/Pain Today's Vitals   09/20/23 1309 09/20/23 1343 09/20/23 1400 09/20/23 1415  BP:  (!) 164/128 115/77 137/63  Pulse:  82 89 77  Resp:  20 20 (!) 38  Temp: 97.9 F (36.6 C)     TempSrc: Oral     SpO2:  95% 92% 96%  Weight:      Height:      PainSc:        Isolation Precautions No active isolations  Medications Medications  albuterol  (VENTOLIN  HFA) 108 (90 Base) MCG/ACT inhaler 2 puff (has no administration in time range)  acetaminophen  (TYLENOL ) tablet 650 mg (has no administration in time range)    Or  acetaminophen  (TYLENOL ) suppository 650 mg (has no administration in time range)  aspirin  chewable tablet 81 mg  (81 mg Oral Given 09/20/23 1111)  atorvastatin  (LIPITOR ) tablet 80 mg (has no administration in time range)  carvedilol  (COREG ) tablet 3.125 mg (3.125 mg Oral Given 09/20/23 1112)  citalopram  (CELEXA ) tablet 20 mg (20 mg Oral Given 09/20/23 1112)  melatonin tablet 5 mg (has no administration in time range)  mometasone -formoterol  (DULERA ) 200-5 MCG/ACT inhaler 2 puff (2 puffs Inhalation Not Given 09/20/23 1113)  prasugrel  (EFFIENT ) tablet 10 mg (has no administration in time range)  predniSONE  (DELTASONE ) tablet 40 mg (40 mg Oral Given 09/20/23 1112)  doxycycline  (VIBRA -TABS) tablet 100 mg (100 mg Oral Given 09/20/23 1437)  insulin  aspart (novoLOG ) injection 0-9 Units (7 Units Subcutaneous Given 09/20/23 1437)  insulin  glargine-yfgn (SEMGLEE ) injection 6 Units (6 Units Subcutaneous Given 09/20/23 1438)  enoxaparin  (LOVENOX ) injection 60 mg (has no administration in time range)  ipratropium-albuterol  (DUONEB) 0.5-2.5 (3) MG/3ML nebulizer solution 3 mL (3 mLs Nebulization Given 09/20/23 0011)    Mobility walks     Focused Assessments Pulmonary Assessment Handoff:  Lung sounds: L Breath Sounds: Diminished, Expiratory wheezes R Breath Sounds: Diminished, Expiratory wheezes O2 Device: NRB O2 Flow Rate (  L/min): 15 L/min    R Recommendations: See Admitting Provider Note  Report given to:   Additional Notes: pt is mouth breather, will desat when he takes the mask off.

## 2023-09-20 NOTE — ED Notes (Signed)
 Pt put on non-rebreather mask at 15 L O2

## 2023-09-20 NOTE — ED Notes (Signed)
 This pt O2 sat ranged from 92%-96% on room air while ambulating.

## 2023-09-21 ENCOUNTER — Other Ambulatory Visit (HOSPITAL_COMMUNITY): Payer: Self-pay

## 2023-09-21 ENCOUNTER — Encounter (HOSPITAL_COMMUNITY): Payer: Self-pay | Admitting: Student

## 2023-09-21 ENCOUNTER — Telehealth (HOSPITAL_COMMUNITY): Payer: Self-pay | Admitting: Pharmacy Technician

## 2023-09-21 ENCOUNTER — Telehealth (HOSPITAL_COMMUNITY): Payer: Self-pay

## 2023-09-21 ENCOUNTER — Telehealth: Payer: Self-pay | Admitting: Pulmonary Disease

## 2023-09-21 DIAGNOSIS — G4733 Obstructive sleep apnea (adult) (pediatric): Secondary | ICD-10-CM

## 2023-09-21 DIAGNOSIS — J441 Chronic obstructive pulmonary disease with (acute) exacerbation: Secondary | ICD-10-CM

## 2023-09-21 DIAGNOSIS — J9620 Acute and chronic respiratory failure, unspecified whether with hypoxia or hypercapnia: Secondary | ICD-10-CM | POA: Diagnosis not present

## 2023-09-21 DIAGNOSIS — E1165 Type 2 diabetes mellitus with hyperglycemia: Secondary | ICD-10-CM | POA: Diagnosis present

## 2023-09-21 LAB — RAPID URINE DRUG SCREEN, HOSP PERFORMED
Amphetamines: NOT DETECTED
Barbiturates: NOT DETECTED
Benzodiazepines: NOT DETECTED
Cocaine: NOT DETECTED
Opiates: NOT DETECTED
Tetrahydrocannabinol: NOT DETECTED

## 2023-09-21 LAB — BASIC METABOLIC PANEL
Anion gap: 9 (ref 5–15)
BUN: 18 mg/dL (ref 8–23)
CO2: 23 mmol/L (ref 22–32)
Calcium: 9 mg/dL (ref 8.9–10.3)
Chloride: 102 mmol/L (ref 98–111)
Creatinine, Ser: 0.62 mg/dL (ref 0.61–1.24)
GFR, Estimated: >60 mL/min (ref >60)
Glucose, Bld: 213 mg/dL — ABNORMAL HIGH (ref 70–99)
Potassium: 3.5 mmol/L (ref 3.5–5.1)
Sodium: 134 mmol/L — ABNORMAL LOW (ref 135–145)

## 2023-09-21 LAB — CBC
HCT: 42.8 % (ref 39.0–52.0)
Hemoglobin: 14.5 g/dL (ref 13.0–17.0)
MCH: 29 pg (ref 26.0–34.0)
MCHC: 33.9 g/dL (ref 30.0–36.0)
MCV: 85.6 fL (ref 80.0–100.0)
Platelets: 272 10*3/uL (ref 150–400)
RBC: 5 MIL/uL (ref 4.22–5.81)
RDW: 15 % (ref 11.5–15.5)
WBC: 14.4 10*3/uL — ABNORMAL HIGH (ref 4.0–10.5)
nRBC: 0 % (ref 0.0–0.2)

## 2023-09-21 LAB — GLUCOSE, CAPILLARY
Glucose-Capillary: 318 mg/dL — ABNORMAL HIGH (ref 70–99)
Glucose-Capillary: 217 mg/dL — ABNORMAL HIGH (ref 70–99)
Glucose-Capillary: 331 mg/dL — ABNORMAL HIGH (ref 70–99)
Glucose-Capillary: 286 mg/dL — ABNORMAL HIGH (ref 70–99)

## 2023-09-21 MED ORDER — HEPARIN (PORCINE) 25000 UT/250ML-% IV SOLN
INTRAVENOUS | Status: AC
  Filled 2023-09-21: qty 250

## 2023-09-21 MED ORDER — UMECLIDINIUM BROMIDE 62.5 MCG/ACT IN AEPB
1.0000 | INHALATION_SPRAY | Freq: Every day | RESPIRATORY_TRACT | Status: DC
  Filled 2023-09-21 (×2): qty 7

## 2023-09-21 MED ORDER — INSULIN GLARGINE-YFGN 100 UNIT/ML ~~LOC~~ SOLN
12.0000 [IU] | Freq: Every day | SUBCUTANEOUS | Status: DC
  Administered 2023-09-22: 12 [IU] via SUBCUTANEOUS
  Filled 2023-09-21: qty 0.12

## 2023-09-21 NOTE — Plan of Care (Addendum)
 Consulted pulmonology on call who has agreed to see patient

## 2023-09-21 NOTE — Plan of Care (Signed)
 FMTS Interim Progress Note  S: Went to bedside with Dr. Joshua to assess patient. Patient was resting comfortably in bed on 2L [place]. Nurse was checking his vitals.   O: BP (!) 129/48 (BP Location: Right Arm)   Pulse 79   Temp (!) 97.5 F (36.4 C) (Oral)   Resp 20   Ht 5' 7 (1.702 m)   Wt 120.2 kg   SpO2 94%   BMI 41.50 kg/m   General: Laying comfortably in bed in NAD. HEENT: NCAT. Sclera anicteric. No rhinorrhea. Cardiovascular: RRR. Respiratory: CTAB, normal WOB on 2L [place]. No wheezing, crackles, rhonchi, or diminished breath sounds. Skin: Warm and dry. Neuro: No focal neurological deficits.   A/P: Acute Hypoxic Respiratory Failure Breathing comfortably on 2L [place] with no distress or coarse breath sounds or wheezing notable on exam.  - Prednisone  40 mg x 5 days - Doxy x 5 days - O2 goal 88-92% - Dulera  2 puff BID - Consider scheduling DuoNebs if symptoms worsen  Rest of plan per H&P.  Janna Ferrier, DO 09/21/2023, 12:15 AM PGY-1, [facility] Family Medicine Service pager [phone]

## 2023-09-21 NOTE — Plan of Care (Signed)

## 2023-09-21 NOTE — Inpatient Diabetes Management (Signed)
 Inpatient Diabetes Program Recommendations  AACE/ADA: New Consensus Statement on Inpatient Glycemic Control (2015)  Target Ranges:  Prepandial:   less than 140 mg/dL      Peak postprandial:   less than 180 mg/dL (1-2 hours)      Critically ill patients:  140 - 180 mg/dL   Lab Results  Component Value Date   GLUCAP 318 (H) 09/21/2023   HGBA1C 7.9 (A) 07/19/2023    Review of Glycemic Control  Latest Reference Range & Units 09/20/23 14:30 09/20/23 16:57 09/20/23 20:41 09/21/23 08:55  Glucose-Capillary 70 - 99 mg/dL 689 (H) 746 (H) 713 (H) 318 (H)   Diabetes history: DM  Outpatient Diabetes medications:  Lantus  14-16 units daily Jardiance  25 mg daily Ozempic  2 mg weekly Metformin  1000 mg bid Current orders for Inpatient glycemic control:  Novolog  0-9 units tid with meals  Semglee  12 units daily Prednisone  40 mg daily Inpatient Diabetes Program Recommendations:    Please consider adding Novolog  3 units tid with meals (hold if patient eats less than 50% or NPO).   Thanks,  Randall Bullocks, RN, BC-ADM Inpatient Diabetes Coordinator Pager [phone]  (8a-5p)

## 2023-09-21 NOTE — Assessment & Plan Note (Addendum)
 Documented to 86%, required bipap shortly. Will treat with steroid burst and abx as indicated. Recently received azithro for COPD exacerbation 09/01/23. Given recent abx, consider pseudomonas coverage but hold for now.  - Prednisone  40mg  qd x5d - Doxy x5 days - oxygen saturation goal = 88%-92% - wean to RA as tolerated - albuterol  2.5mg  neb q2h PRN - Continue home inhaler Dulera , will add Ellipta for triple therapy

## 2023-09-21 NOTE — Consult Note (Signed)
 NAME:  [name] [name], MRN:  [id number], DOB:  May 26, 1961, LOS: 1 ADMISSION DATE:  09/19/2023, CONSULTATION DATE: 09/21/2023 REFERRING MD: Teaching service, CHIEF COMPLAINT: Shortness of breath at least 2 hospitalizations with multiple times  History of Present Illness:  63 year old male who is disabled former smoker with a plethora of health issues that are well-documented below.  He has had multiple admissions for shortness of breath that usually gets better with steroids but he has been intubated in the past.  He was due for a pulmonary function test today 09/21/2023 but again was short of breath and was admitted.  His radiographic data is unremarkable.  He is on a quite a few medications he also wears his CPAP religiously.  Consideration for possible aspiration with CPAP he can add proton pump inhibitor.  He needs to repeat his sleep study has been over 10 years.  Needs to establish in the pulmonary office so we can see 1 person and have continuity of care.  Pertinent  Medical History   Past Medical History:  Diagnosis Date   Alcoholism (HCC)    Anxiety    Arthritis    knees   Asthma    CAD S/P percutaneous coronary angioplasty 01/18/12; 10/2013   a. pRCA 3.5 x 18 vision BMS - 4.2 mm; b. 2/'15: mCx 3.5 x 12  Rebel BMS (3.6-3.7 mm)   Chronic back pain    mid/lower (08/01/2014)   COPD (chronic obstructive pulmonary disease) (HCC)    I'm seeing COPD dr now; don't know if I've got it (08/01/2014)   Depression    Dyspnea    GERD (gastroesophageal reflux disease)    Hyperlipidemia    Hypertension    Migraines    once in awhile (08/01/2014); no current problems as of 09/18/23 per pt.   Non-ST elevation myocardial infarction (NSTEMI) (HCC) 10/2013   OSA on CPAP 01/19/2012   Pneumonia    Sleep apnea    uses CPAP nightly   Type II diabetes mellitus (HCC)      Significant Hospital Events: Including procedures, antibiotic start and stop dates in addition to other pertinent events      Interim History / Subjective:   Past Medical History:  Diagnosis Date   Alcoholism (HCC)    Anxiety    Arthritis    knees   Asthma    CAD S/P percutaneous coronary angioplasty 01/18/12; 10/2013   a. pRCA 3.5 x 18 vision BMS - 4.2 mm; b. 2/'15: mCx 3.5 x 12  Rebel BMS (3.6-3.7 mm)   Chronic back pain    mid/lower (08/01/2014)   COPD (chronic obstructive pulmonary disease) (HCC)    I'm seeing COPD dr now; don't know if I've got it (08/01/2014)   Depression    Dyspnea    GERD (gastroesophageal reflux disease)    Hyperlipidemia    Hypertension    Migraines    once in awhile (08/01/2014); no current problems as of 09/18/23 per pt.   Non-ST elevation myocardial infarction (NSTEMI) (HCC) 10/2013   OSA on CPAP 01/19/2012   Pneumonia    Sleep apnea    uses CPAP nightly   Type II diabetes mellitus (HCC)      Objective   Blood pressure (!) 120/57, pulse 61, temperature 97.7 F (36.5 C), temperature source Oral, resp. rate 20, height 5' 7 (1.702 m), weight 120.2 kg, SpO2 96%.    FiO2 (%):  [28 %] 28 %   Intake/Output Summary (Last 24 hours) at 09/21/2023 1222 Last  data filed at 09/21/2023 0900 Gross per 24 hour  Intake 177 ml  Output 1300 ml  Net -1123 ml   Filed Weights   09/20/23 0013  Weight: 120.2 kg    Examination: General: Strange a fact but friendly overweight male HENT: No JVD lymphadenopathy is appreciated, he does have a history of bilateral ear blockages and tympanic membrane ruptures Lungs: Coarse rhonchi bilaterally Cardiovascular: Heart sounds are regular Abdomen: Obese soft nontender Extremities: 2+ edema Neuro: Grossly intact without focal defects but has a bizarre a fact GU: Voids  Resolved Hospital Problem list     Assessment & Plan:  Acute on chronic respiratory failure in the setting of chronic bronchitis, CPAP dependent with a history of smoking denies fevers chills sweats sputum production.  He reports he is religious about wearing his  CPAP. Question if he is aspirating with his CPAP machine yet swallow evaluation may be helpful He was scheduled for pulmonary function test today but he did not make it He has been admitted multiple times and intubated and the past for this bronchitis that turns into acute respiratory distress which starts off as  shortness of breath. Bronchodilators O2 as needed Follow-up with pulmonary Consider proton pump inhibitors  Extensive cardiac history Is followed by Maude [facility]  Morbid obesity Weight loss  Diabetes mellitus Per primary  Depression and alcohol abuse Elevated alcohol level noted UDS ordered    Best Practice (right click and Reselect all SmartList Selections daily)   Diet/type: Regular consistency (see orders) DVT prophylaxis LMWH Pressure ulcer(s): N/A GI prophylaxis: PPI Lines: N/A Foley:  N/A Code Status:  full code Last date of multidisciplinary goals of care discussion [tbd]  Labs   CBC: Recent Labs  Lab 09/20/23 0034 09/20/23 0308 09/21/23 0619  WBC  --  12.6* 14.4*  NEUTROABS  --  11.7*  --   HGB 15.6 13.4 14.5  HCT 46.0 39.9 42.8  MCV  --  87.5 85.6  PLT  --  230 272    Basic Metabolic Panel: Recent Labs  Lab 09/19/23 2350 09/20/23 0034 09/21/23 0619  NA 136 138 134*  K 3.6 3.7 3.5  CL 100  --  102  CO2 23  --  23  GLUCOSE 192*  --  213*  BUN 12  --  18  CREATININE 0.74  --  0.62  CALCIUM  9.1  --  9.0   GFR: Estimated Creatinine Clearance: 118.8 mL/min (by C-G formula based on SCr of 0.62 mg/dL). Recent Labs  Lab 09/20/23 0308 09/21/23 0619  WBC 12.6* 14.4*    Liver Function Tests: No results for input(s): AST, ALT, ALKPHOS, BILITOT, PROT, ALBUMIN in the last 168 hours. No results for input(s): LIPASE, AMYLASE in the last 168 hours. No results for input(s): AMMONIA in the last 168 hours.  ABG    Component Value Date/Time   PHART 7.340 (L) 05/10/2021 1532   PCO2ART 47.3 05/10/2021 1532   PO2ART  141 (H) 05/10/2021 1532   HCO3 28.4 (H) 09/20/2023 0034   TCO2 30 09/20/2023 0034   ACIDBASEDEF 1.0 05/10/2021 1532   O2SAT 96 09/20/2023 0034     Coagulation Profile: No results for input(s): INR, PROTIME in the last 168 hours.  Cardiac Enzymes: No results for input(s): CKTOTAL, CKMB, CKMBINDEX, TROPONINI in the last 168 hours.  HbA1C: HbA1c, POC (controlled diabetic range)  Date/Time Value Ref Range Status  07/19/2023 11:23 AM 7.9 (A) 0.0 - 7.0 % Final  02/14/2023 11:33 AM 8.1 (A) 0.0 -  7.0 % Final   Hgb A1c MFr Bld  Date/Time Value Ref Range Status  04/11/2023 07:45 AM 8.2 (H) 4.8 - 5.6 % Final    Comment:    (NOTE)         Prediabetes: 5.7 - 6.4         Diabetes: >6.4         Glycemic control for adults with diabetes: <7.0     CBG: Recent Labs  Lab 09/20/23 1430 09/20/23 1657 09/20/23 2041 09/21/23 0855 09/21/23 1208  GLUCAP 310* 253* 286* 318* 217*    Review of Systems:   10 point review of system taken, please see HPI for positives and negatives.   Past Medical History:  He,  has a past medical history of Alcoholism (HCC), Anxiety, Arthritis, Asthma, CAD S/P percutaneous coronary angioplasty (01/18/12; 10/2013), Chronic back pain, COPD (chronic obstructive pulmonary disease) (HCC), Depression, Dyspnea, GERD (gastroesophageal reflux disease), Hyperlipidemia, Hypertension, Migraines, Non-ST elevation myocardial infarction (NSTEMI) (HCC) (10/2013), OSA on CPAP (01/19/2012), Pneumonia, Sleep apnea, and Type II diabetes mellitus (HCC).   Surgical History:   Past Surgical History:  Procedure Laterality Date   CARDIAC CATHETERIZATION  07/2014   Left Main: Short, large-caliber vessel. Widely patent. Bifurcates into the LAD and Circumflex. Angiographically normal.   CORONARY ANGIOPLASTY WITH STENT PLACEMENT  01/2012; 11/07/2013; 08/01/2014   1 + 2 + 1    CORONARY ANGIOPLASTY WITH STENT PLACEMENT  07/2014   Severe single-vessel disease involving the  bifurcation of OM1 and OM 2 with 99% in-stent restenosis in the bare-metal stent placed in the OM 2.   LEFT HEART CATHETERIZATION WITH CORONARY ANGIOGRAM N/A 01/20/2012   Procedure: LEFT HEART CATHETERIZATION WITH CORONARY ANGIOGRAM;  Surgeon: Candyce GORMAN Reek, MD;  Location[facility] CATH LAB;  Service: Cardiovascular;  Laterality: N/A;   LEFT HEART CATHETERIZATION WITH CORONARY ANGIOGRAM N/A 11/07/2013   Procedure: LEFT HEART CATHETERIZATION WITH CORONARY ANGIOGRAM;  Surgeon: Candyce GORMAN Reek, MD;  Location: [facility] CATH LAB;  Service: Cardiovascular;  Laterality: N/A;   LEFT HEART CATHETERIZATION WITH CORONARY ANGIOGRAM N/A 08/01/2014   Procedure: LEFT HEART CATHETERIZATION WITH CORONARY ANGIOGRAM;  Surgeon: Alm LELON Clay, MD;  Location: [facility] CATH LAB;  Service: Cardiovascular;  Laterality: N/A;   MULTIPLE TOOTH EXTRACTIONS     PERCUTANEOUS CORONARY STENT INTERVENTION (PCI-S)  01/20/2012   Procedure: PERCUTANEOUS CORONARY STENT INTERVENTION (PCI-S);  Surgeon: Candyce GORMAN Reek, MD;  Location: [facility] CATH LAB;  Service: Cardiovascular;;   PERCUTANEOUS CORONARY STENT INTERVENTION (PCI-S)  11/07/2013   Procedure: PERCUTANEOUS CORONARY STENT INTERVENTION (PCI-S);  Surgeon: Candyce GORMAN Reek, MD;  Location: [facility] CATH LAB;  Service: Cardiovascular;;     Social History:   reports that he quit smoking about 5 years ago. His smoking use included cigarettes. He started smoking about 48 years ago. He has a 42.4 pack-year smoking history. He has quit using smokeless tobacco.  His smokeless tobacco use included chew. He reports that he does not currently use alcohol after a past usage of about 6.0 standard drinks of alcohol per week. He reports that he does not use drugs.   Family History:  His family history includes Breast cancer in his mother; Diabetes in his brother and sister; Heart attack in his brother; Heart attack (age of onset: 17) in his father; Hyperlipidemia in his sister and sister; Hypertension in his  sister and sister; Stroke in his brother. There is no history of Colon cancer, Esophageal cancer, Rectal cancer, or Stomach cancer.   Allergies Allergies  Allergen  Reactions   Shellfish Allergy Nausea And Vomiting    OYSTERS     Home Medications  Prior to Admission medications   Medication Sig Start Date End Date Taking? Authorizing Provider  aspirin  81 MG chewable tablet Chew 1 tablet (81 mg total) by mouth daily. 10/04/13  Yes Fredirick Glenys RAMAN, MD  atorvastatin  (LIPITOR ) 80 MG tablet TAKE 1 TABLET BY MOUTH DAILY Patient taking differently: Take 80 mg by mouth at bedtime. 04/25/23  Yes Chambliss, Layman CROME, MD  carvedilol  (COREG ) 3.125 MG tablet TAKE 1 TABLET BY MOUTH TWICE A DAY WITH A MEAL 05/22/23  Yes Chambliss, Layman CROME, MD  citalopram  (CELEXA ) 20 MG tablet Take 1 tablet (20 mg total) by mouth daily. 07/19/23  Yes Chambliss, Layman CROME, MD  gentamicin  cream (GARAMYCIN ) 0.1 % Apply to right foot once daily. Patient taking differently: Apply 1 Application topically daily as needed (irritation). Apply to right foot once daily. 08/30/22  Yes Gaynel Delon CROME, DPM  insulin  glargine (LANTUS  SOLOSTAR) 100 UNIT/ML Solostar Pen Inject 14-16 Units into the skin daily. 06/20/23  Yes Chambliss, Layman CROME, MD  JARDIANCE  25 MG TABS tablet TAKE 1 TABLET BY MOUTH DAILY 05/22/23  Yes Chambliss, Layman CROME, MD  lisinopril  (ZESTRIL ) 2.5 MG tablet TAKE 1 TABLET BY MOUTH TWICE A DAY 08/18/23  Yes Chambliss, Layman CROME, MD  Melatonin 2.5 MG CAPS Take 5 mg by mouth at bedtime.   Yes [provider]  metFORMIN  (GLUCOPHAGE ) 1000 MG tablet TAKE ONE TABLET BY MOUTH TWICE A DAY WITH MEALS 01/25/23  Yes Chambliss, Layman CROME, MD  mometasone -formoterol  (DULERA ) 200-5 MCG/ACT AERO Inhale 2 puffs into the lungs in the morning and at bedtime. 05/16/23  Yes Jeanelle Layman CROME, MD  Multiple Vitamin (MULTIVITAMIN) tablet Take 1 tablet by mouth daily.   Yes [provider]  nitroGLYCERIN  (NITROSTAT ) 0.4 MG  SL tablet DISSOLVE 1 TABLET UNDER THE TONGUE FOR CHEST PAIN. IF PAIN REMAINS AFTER 5 MINUTES, CALL 911 AND REPEAT DOSE. MAX 3 TABLETS IN 15 MINUTES 11/01/21  Yes Chambliss, Layman CROME, MD  NON FORMULARY Pt uses a c-pap nightly   Yes [provider]  Omega-3 Fatty Acids  (FISH OIL ) 1000 MG CAPS Take 1 capsule (1,000 mg total) by mouth daily. 08/01/19  Yes Hensel, Elsie LABOR, MD  prasugrel  (EFFIENT ) 10 MG TABS tablet Take 1 tablet (10 mg total) by mouth daily. 10/17/22  Yes Jordan, Peter M, MD  Semaglutide , 2 MG/DOSE, (OZEMPIC , 2 MG/DOSE,) 8 MG/3ML SOPN Inject 2 mg into the skin once a week. Patient taking differently: Inject 2 mg into the skin once a week. Monday 11/22/22  Yes Jeanelle Layman CROME, MD  CIPRODEX  OTIC suspension Place 4 drops into both ears 2 (two) times daily. Patient not taking: Reported on 09/20/2023 10/07/21   [provider]  Continuous Glucose Sensor (FREESTYLE LIBRE 2 SENSOR) MISC APPLY SENSOR TO SKIN FOR CONTINUOUS BLOOD SUGAR MONITORING, REPLACE EVERY 14 DAYS 05/29/23   Jeanelle Layman CROME, MD  DROPLET PEN NEEDLES 29G X MISC USE ONCE DAILY WITH VICTOZA  PEN 03/28/23   Delores Suzann HERO, MD  glucose blood (ACCU-CHEK GUIDE) test strip Use as instructed 10/11/21   Jeanelle Layman CROME, MD  Insulin  Pen Needle (PEN NEEDLES) 32G X 4 MM MISC Use as directed with insulin  four times daily 09/15/21   Jeanelle Layman CROME, MD  traMADol  (ULTRAM ) 50 MG tablet TAKE 1 TABLET BY MOUTH EVERY 6 HOURS AS NEEDED Patient not taking: Reported on 07/19/2023 01/26/23   Chambliss,  Layman CROME, MD     Critical care time: catherin Kemps TRUE Garciamartinez ACNP Acute Care Nurse Practitioner [facility]/Critical [facility] Please consult Amion 09/21/2023, 12:22 PM

## 2023-09-21 NOTE — Assessment & Plan Note (Signed)
 On Ozempic, Metformin, Lantus 14-16U, and Jardiance at home. CBGs persistently elevated in 200s-300s, likely exacerbated by steroid use.  - increase LAI to 12U - continue sensitive SSI - CBGs 4 times daily before meals and at bedtime

## 2023-09-21 NOTE — Telephone Encounter (Signed)
 Patient Product/process Development Scientist completed.    The patient is insured through [facility] MEDICAID.     Ran test claim for Spirva and the current 30 day co-pay is $4.00.  Ran test claim for Incruse Ellipta  62.5 and the current 30 day co-pay is $4.00.  This test claim was processed through [facility]- copay amounts may vary at other pharmacies due to pharmacy/plan contracts, or as the patient moves through the different stages of their insurance plan.     Reyes Sharps, CPHT Pharmacy Technician III Certified Patient Advocate [facility] Patient Advocate Team Direct Number: [phone]  Fax: [phone]

## 2023-09-21 NOTE — Telephone Encounter (Signed)
 Fax received from Dr. Carlie with [facility] to perform a left open cavity tympanomastoidectomy  on patient.  Patient needs surgery clearance. Surgery is 09/27/23. Patient was seen on 07/07/23. Office protocol is a risk assessment can be sent to surgeon if patient has been seen in 60 days or less.   Pt is currently admitted to [facility] with respiratory failure- under PCCM care. I faxed to Dr Carlie office to let them know unable to clear him without appt at this time.   Routing to Dr Theophilus as FYI

## 2023-09-21 NOTE — Telephone Encounter (Signed)
 Pharmacy Patient Advocate Encounter  Insurance verification completed.    The patient is insured through [facility]. Patient has [employer], may use a copay card, and/or apply for patient assistance if available.    Ran test claim for Breztri   and prior authorization is required.  Ran test claim for Trelegy and prior authorization is required.   This test claim was processed through [facility]- copay amounts may vary at other pharmacies due to pharmacy/plan contracts, or as the patient moves through the different stages of their insurance plan.

## 2023-09-21 NOTE — Progress Notes (Signed)
     Daily Progress Note Intern Pager: [phone]  Patient name: [name] [name] Medical record number: [id number] Date of birth: [birth year] Age: 63 y.o. Gender: male  Primary Care Provider: Jeanelle Layman CROME, MD Consultants: none Code Status: Full  Pt Overview and Major Events to Date:  1/8 - admitted  Assessment and Plan:  63yo male with PMHx HTN, CAD, OSA, T2DM presented with acute hypoxic respiratory failure in setting of suspected COPD exacerbation. Will continue to try to wean off oxygen today and add ellipta inhaler for triple therapy Assessment & Plan Acute hypoxic respiratory failure (HCC) Documented to 86%, required bipap shortly. Will treat with steroid burst and abx as indicated. Recently received azithro for COPD exacerbation 09/01/23. Given recent abx, consider pseudomonas coverage but hold for now.  - Prednisone  40mg  qd x5d - Doxy x5 days - oxygen saturation goal = 88%-92% - wean to RA as tolerated - albuterol  2.5mg  neb q2h PRN - Continue home inhaler Dulera , will add Ellipta for triple therapy Type 2 diabetes mellitus with hyperglycemia (HCC) On Ozempic , Metformin , Lantus  14-16U, and Jardiance  at home. CBGs persistently elevated in 200s-300s, likely exacerbated by steroid use.  - increase LAI to 12U - continue sensitive SSI - CBGs 4 times daily before meals and at bedtime   Chronic and Stable Problems:  HTN - Coreg , Lisinopril   CAD - ASA, Effient   OSA - needs cpap at night    FEN/GI: regular PPx: lovenox  Dispo:Home pending clinical improvement . Barriers include oxygen requirement.   Subjective:  NAEON, no complaints this morning, thinks he is improving  Objective: Temp:  [97.5 F (36.4 C)-97.9 F (36.6 C)] 97.7 F (36.5 C) (01/09 0853) Pulse Rate:  [61-93] 61 (01/09 0853) Resp:  [15-38] 20 (01/09 0853) BP: (115-164)/(48-128) 120/57 (01/09 0853) SpO2:  [91 %-98 %] 96 % (01/09 0853) FiO2 (%):  [28 %] 28 % (01/09 0041) Physical Exam: General: well  appearing, NAD, sleepy but easily arousable Cardiovascular: RRR, no m/r/g Respiratory: wearing CPAP, difficult to auscultate 2/2 body habitus but no obvious abnormal lung sounds Abdomen: Normal bowel sounds, soft, non-tender Extremities: No swelling BLE  Laboratory: Most recent CBC Lab Results  Component Value Date   WBC 14.4 (H) 09/21/2023   HGB 14.5 09/21/2023   HCT 42.8 09/21/2023   MCV 85.6 09/21/2023   PLT 272 09/21/2023   Most recent BMP    Latest Ref Rng & Units 09/21/2023    6:19 AM  BMP  Glucose 70 - 99 mg/dL 786   BUN 8 - 23 mg/dL 18   Creatinine 9.38 - 1.24 mg/dL 9.37   Sodium 864 - 854 mmol/L 134   Potassium 3.5 - 5.1 mmol/L 3.5   Chloride 98 - 111 mmol/L 102   CO2 22 - 32 mmol/L 23   Calcium  8.9 - 10.3 mg/dL 9.0    Imaging/Diagnostic Tests: CXR 09/20/23 IMPRESSION: No active disease.  Laberta Wilbon, DO 09/21/2023, 11:10 AM  PGY-1, [facility] FPTS Intern pager: [phone], text pages welcome Secure chat group [facility] Teaching Service

## 2023-09-21 NOTE — Evaluation (Signed)
 Physical Therapy Evaluation Patient Details Name: [name] [name] MRN: [id number] DOB: 1961/01/27 Today's Date: 09/21/2023  History of Present Illness  [name] [name] is a 63 y/o male admitted 09/19/23 due to dyspnea and hypoxia as well as decreased mobility. Seen in clinic on 09/01/23 for dyspnea that had been worsening over past few days. COPD exacerbation was treated with azithro and prednisone  at that time.  PMHx significant for COPD, OSA on CPAP, HLD, HTN, CAD, depression, DMII, tobacco use disorder, CAD and Hx of NSTEMI.   Clinical Impression  Pt admitted with above diagnosis. PTA pt lived at home with his sister, I/mod I without AD. Pt currently with functional limitations due to the deficits listed below (see PT Problem List). On eval, pt demo mod I bed mobility. Supervision transfers and amb 60' without AD. Steady gait. Amb on RA with SpO2 93%. No SOB/DOE. Pt will benefit from acute skilled PT to increase their independence and safety with mobility to allow discharge. PT to follow acutely. No follow up services or DME needed.           If plan is discharge home, recommend the following: Assistance with cooking/housework;Assist for transportation   Can travel by private vehicle        Equipment Recommendations None recommended by PT  Recommendations for Other Services       Functional Status Assessment Patient has had a recent decline in their functional status and demonstrates the ability to make significant improvements in function in a reasonable and predictable amount of time.     Precautions / Restrictions Precautions Precautions: Fall;Other (comment) Precaution Comments: watch SpO2 Restrictions Weight Bearing Restrictions Per Provider Order: No      Mobility  Bed Mobility Overal bed mobility: Modified Independent                  Transfers Overall transfer level: Needs assistance Equipment used: None Transfers: Sit to/from Stand Sit to Stand: Supervision            General transfer comment: no physical assist    Ambulation/Gait Ambulation/Gait assistance: Supervision Gait Distance (Feet): 60 Feet Assistive device: None Gait Pattern/deviations: Step-through pattern, Decreased stride length, Wide base of support Gait velocity: mildly decreased Gait velocity interpretation: 1.31 - 2.62 ft/sec, indicative of limited community ambulator   General Gait Details: Steady gait without AD. SpO2 93% on RA. No SOB/DOE  Stairs            Wheelchair Mobility     Tilt Bed    Modified Rankin (Stroke Patients Only)       Balance Overall balance assessment: Mild deficits observed, not formally tested                                           Pertinent Vitals/Pain Pain Assessment Pain Assessment: No/denies pain    Home Living Family/patient expects to be discharged to:: Private residence Living Arrangements: Other relatives (sister) Available Help at Discharge: Family;Available PRN/intermittently Type of Home: Other(Comment) (townhouse) Home Access: Stairs to enter Entrance Stairs-Rails: Right;Left;Can reach both Entrance Stairs-Number of Steps: 5 Alternate Level Stairs-Number of Steps: 12 Home Layout: Two level;Bed/bath upstairs Home Equipment: None      Prior Function Prior Level of Function : Independent/Modified Independent             Mobility Comments: no AD at baseline       Extremity/Trunk  Assessment   Upper Extremity Assessment Upper Extremity Assessment: Overall WFL for tasks assessed    Lower Extremity Assessment Lower Extremity Assessment: Overall WFL for tasks assessed    Cervical / Trunk Assessment Cervical / Trunk Assessment: Other exceptions Cervical / Trunk Exceptions: obese  Communication   Communication Communication: No apparent difficulties  Cognition Arousal: Alert Behavior During Therapy: WFL for tasks assessed/performed Overall Cognitive Status: No family/caregiver  present to determine baseline cognitive functioning                                 General Comments: Suspect baseline. Pleasant and cooperative. Following commands consistently.        General Comments General comments (skin integrity, edema, etc.): Pt on cpap on arrival, SpO2 96% on 2L. Mobilized on RA with SpO2 93%. No SOB/DOE    Exercises     Assessment/Plan    PT Assessment Patient needs continued PT services  PT Problem List Decreased balance;Decreased mobility;Cardiopulmonary status limiting activity;Decreased activity tolerance       PT Treatment Interventions Functional mobility training;Balance training;Patient/family education;Therapeutic activities;Gait training;Stair training    PT Goals (Current goals can be found in the Care Plan section)  Acute Rehab PT Goals Patient Stated Goal: home PT Goal Formulation: With patient Time For Goal Achievement: 10/05/23 Potential to Achieve Goals: Good    Frequency Min 1X/week     Co-evaluation               AM-PAC PT 6 Clicks Mobility  Outcome Measure Help needed turning from your back to your side while in a flat bed without using bedrails?: None Help needed moving from lying on your back to sitting on the side of a flat bed without using bedrails?: None Help needed moving to and from a bed to a chair (including a wheelchair)?: A Little Help needed standing up from a chair using your arms (e.g., wheelchair or bedside chair)?: A Little Help needed to walk in hospital room?: A Little Help needed climbing 3-5 steps with a railing? : A Little 6 Click Score: 20    End of Session   Activity Tolerance: Patient tolerated treatment well Patient left: in bed;with call bell/phone within reach Nurse Communication: Mobility status PT Visit Diagnosis: Difficulty in walking, not elsewhere classified (R26.2)    Time: 8884-8864 PT Time Calculation (min) (ACUTE ONLY): 20 min   Charges:   PT  Evaluation $PT Eval Low Complexity: 1 Low   PT General Charges $$ ACUTE PT VISIT: 1 Visit         Sari MATSU., PT  Office # [phone]   Erven Sari Shaker 09/21/2023, 12:20 PM

## 2023-09-21 NOTE — Progress Notes (Addendum)
 Initial Nutrition Assessment  DOCUMENTATION CODES:   Morbid obesity  INTERVENTION:  Provided education for DM2. Educated pt on importance of eating consistent meals and getting adequate protein with meals. Encouraged pt to continue drinking diet sodas. Recommend daily activity/daily exercise if allowed by physician.  Adjusted diet to carbohydrate modified.   NUTRITION DIAGNOSIS:   Food and nutrition related knowledge deficit related to limited prior education as evidenced by per patient/family report.   GOAL:   Patient will meet greater than or equal to 90% of their needs   MONITOR:   PO intake, Weight trends  REASON FOR ASSESSMENT:   Consult Assessment of nutrition requirement/status  ASSESSMENT:   Pt admitted for acute hypoxic respiratory failure and presents with SOB. Pt with PMH of HTN, pt with multiple admissions in past year for COPD, OSA uses CPAP nightly, DM2, depression, ETOH abuse, asthma, CAD, GERD, NSTEMI.  Noted from chart upcoming procedure: 1/15- left open cavity tympanomastoidectomy. Pt on 2 L nasal canula. Pulmonology consulted per notes pt potentially aspirating at night.   Pt lives with sister who cooks about 2x a week. Pt and his sister eat at restaurants occasionally. For breakfast pt will drink coffee. For lunch he eats a couple of turkey and cheese sandwiches with a couple cookies and peanut butter crackers. Pt usually has frozen meals for dinner and will enjoy a bowl of ice cream about 3x a week for dessert. He drinks a diet ginger ale with most meals. Pt will skip dinner about 3x per week.  Pt has been taking Ozempic  for about 2 years. He has gradually lost weight over the 2 year period going from 310 lb to 264 lb. Pt reports he would walk for an hour and half at [employer] a few times per month, but now can only walk for 20 minutes twice a month due to SOB.    Labs: Glucose-capillary 253-318, A1C 8.2  Meds:  Novolog  TID, Semglee  daily,  Effient  10 mg daily   NUTRITION - FOCUSED PHYSICAL EXAM:  Flowsheet Row Most Recent Value  Orbital Region No depletion  Upper Arm Region No depletion  Thoracic and Lumbar Region No depletion  Buccal Region No depletion  Temple Region No depletion  Clavicle Bone Region No depletion  Clavicle and Acromion Bone Region No depletion  Scapular Bone Region No depletion  Dorsal Hand No depletion  Patellar Region No depletion  Anterior Thigh Region No depletion  Posterior Calf Region No depletion  Edema (RD Assessment) None  Hair Reviewed  Eyes Reviewed  Mouth Reviewed  [edentulous-well fitting dentures at home]  Skin Reviewed  Nails Reviewed       Diet Order:   Diet Order             Diet Carb Modified Fluid consistency: Thin; Room service appropriate? Yes  Diet effective now                   EDUCATION NEEDS:      Skin:  Skin Assessment: Reviewed RN Assessment  Last BM:  1/7  Height:   Ht Readings from Last 1 Encounters:  09/20/23 5' 7 (1.702 m)    Weight:   Wt Readings from Last 1 Encounters:  09/20/23 120.2 kg    Ideal Body Weight:     BMI:  Body mass index is 41.5 kg/m.  Estimated Nutritional Needs:   Kcal:  1900-2100  Protein:  100-115 g  Fluid:  > 1.9 L    Tinnie Ferraris,  MS Dietetic Intern

## 2023-09-22 ENCOUNTER — Inpatient Hospital Stay (HOSPITAL_COMMUNITY): Payer: Medicaid Other

## 2023-09-22 ENCOUNTER — Other Ambulatory Visit (HOSPITAL_COMMUNITY): Payer: Self-pay

## 2023-09-22 ENCOUNTER — Telehealth: Payer: Self-pay | Admitting: Internal Medicine

## 2023-09-22 DIAGNOSIS — K219 Gastro-esophageal reflux disease without esophagitis: Secondary | ICD-10-CM | POA: Diagnosis not present

## 2023-09-22 DIAGNOSIS — J9601 Acute respiratory failure with hypoxia: Secondary | ICD-10-CM | POA: Diagnosis not present

## 2023-09-22 DIAGNOSIS — J441 Chronic obstructive pulmonary disease with (acute) exacerbation: Secondary | ICD-10-CM | POA: Diagnosis not present

## 2023-09-22 DIAGNOSIS — J9620 Acute and chronic respiratory failure, unspecified whether with hypoxia or hypercapnia: Secondary | ICD-10-CM | POA: Diagnosis not present

## 2023-09-22 DIAGNOSIS — G4733 Obstructive sleep apnea (adult) (pediatric): Secondary | ICD-10-CM | POA: Diagnosis not present

## 2023-09-22 LAB — BASIC METABOLIC PANEL
Anion gap: 10 (ref 5–15)
BUN: 13 mg/dL (ref 8–23)
CO2: 25 mmol/L (ref 22–32)
Calcium: 8.5 mg/dL — ABNORMAL LOW (ref 8.9–10.3)
Chloride: 101 mmol/L (ref 98–111)
Creatinine, Ser: 0.64 mg/dL (ref 0.61–1.24)
GFR, Estimated: >60 mL/min (ref >60)
Glucose, Bld: 200 mg/dL — ABNORMAL HIGH (ref 70–99)
Potassium: 3.6 mmol/L (ref 3.5–5.1)
Sodium: 136 mmol/L (ref 135–145)

## 2023-09-22 LAB — CBC
HCT: 42.3 % (ref 39.0–52.0)
Hemoglobin: 14.2 g/dL (ref 13.0–17.0)
MCH: 29 pg (ref 26.0–34.0)
MCHC: 33.6 g/dL (ref 30.0–36.0)
MCV: 86.3 fL (ref 80.0–100.0)
Platelets: 229 10*3/uL (ref 150–400)
RBC: 4.9 MIL/uL (ref 4.22–5.81)
RDW: 14.9 % (ref 11.5–15.5)
WBC: 11.4 10*3/uL — ABNORMAL HIGH (ref 4.0–10.5)
nRBC: 0 % (ref 0.0–0.2)

## 2023-09-22 LAB — GLUCOSE, CAPILLARY
Glucose-Capillary: 190 mg/dL — ABNORMAL HIGH (ref 70–99)
Glucose-Capillary: 286 mg/dL — ABNORMAL HIGH (ref 70–99)

## 2023-09-22 MED ORDER — PANTOPRAZOLE SODIUM 40 MG PO TBEC
40.0000 mg | DELAYED_RELEASE_TABLET | Freq: Two times a day (BID) | ORAL | Status: DC
  Administered 2023-09-22: 40 mg via ORAL
  Filled 2023-09-22: qty 1

## 2023-09-22 MED ORDER — PANTOPRAZOLE SODIUM 40 MG PO TBEC
40.0000 mg | DELAYED_RELEASE_TABLET | Freq: Two times a day (BID) | ORAL | 0 refills | Status: DC
  Filled 2023-09-22: qty 60, 30d supply, fill #0

## 2023-09-22 NOTE — Evaluation (Signed)
 Clinical/Bedside Swallow Evaluation Patient Details  Name: [name] [name] MRN: [id number] Date of Birth: 04-23-1961  Today's Date: 09/22/2023 Time: SLP Start Time (ACUTE ONLY): 9074 SLP Stop Time (ACUTE ONLY): 0950 SLP Time Calculation (min) (ACUTE ONLY): 25 min  Past Medical History:  Past Medical History:  Diagnosis Date   Alcoholism (HCC)    Anxiety    Arthritis    knees   Asthma    CAD S/P percutaneous coronary angioplasty 01/18/12; 10/2013   a. pRCA 3.5 x 18 vision BMS - 4.2 mm; b. 2/'15: mCx 3.5 x 12  Rebel BMS (3.6-3.7 mm)   Chronic back pain    mid/lower (08/01/2014)   Colon polyps 02/18/2020   May 2021 - 11 polyps that I removed during your recent procedure were proven to be adenomatous. Fortunately all of these polyps were extremely small in size.  These are considered to be pre-cancerous polyps that may have grown into cancers if they had not been removed.  Studies show that at least 20% of women over age 67 and 30% of men over age 94 have pre-cancerous polyps. Based on current nation   COPD (chronic obstructive pulmonary disease) (HCC)    I'm seeing COPD dr now; don't know if I've got it (08/01/2014)   Depression    Dyspnea    GERD (gastroesophageal reflux disease)    Hyperlipidemia    Hypertension    Migraines    once in awhile (08/01/2014); no current problems as of 09/18/23 per pt.   Non-ST elevation myocardial infarction (NSTEMI) (HCC) 10/2013   OSA on CPAP 01/19/2012   Pneumonia    Sleep apnea    uses CPAP nightly   Type II diabetes mellitus [facility])    Past Surgical History:  Past Surgical History:  Procedure Laterality Date   CARDIAC CATHETERIZATION  07/2014   Left Main: Short, large-caliber vessel. Widely patent. Bifurcates into the LAD and Circumflex. Angiographically normal.   CORONARY ANGIOPLASTY WITH STENT PLACEMENT  01/2012; 11/07/2013; 08/01/2014   1 + 2 + 1    CORONARY ANGIOPLASTY WITH STENT PLACEMENT  07/2014   Severe single-vessel disease  involving the bifurcation of OM1 and OM 2 with 99% in-stent restenosis in the bare-metal stent placed in the OM 2.   LEFT HEART CATHETERIZATION WITH CORONARY ANGIOGRAM N/A 01/20/2012   Procedure: LEFT HEART CATHETERIZATION WITH CORONARY ANGIOGRAM;  Surgeon: Candyce GORMAN Reek, MD;  Location: [facility] CATH LAB;  Service: Cardiovascular;  Laterality: N/A;   LEFT HEART CATHETERIZATION WITH CORONARY ANGIOGRAM N/A 11/07/2013   Procedure: LEFT HEART CATHETERIZATION WITH CORONARY ANGIOGRAM;  Surgeon: Candyce GORMAN Reek, MD;  Location[facility] CATH LAB;  Service: Cardiovascular;  Laterality: N/A;   LEFT HEART CATHETERIZATION WITH CORONARY ANGIOGRAM N/A 08/01/2014   Procedure: LEFT HEART CATHETERIZATION WITH CORONARY ANGIOGRAM;  Surgeon: Alm LELON Clay, MD;  Location: [facility] CATH LAB;  Service: Cardiovascular;  Laterality: N/A;   MULTIPLE TOOTH EXTRACTIONS     PERCUTANEOUS CORONARY STENT INTERVENTION (PCI-S)  01/20/2012   Procedure: PERCUTANEOUS CORONARY STENT INTERVENTION (PCI-S);  Surgeon: Candyce GORMAN Reek, MD;  Location: [facility] CATH LAB;  Service: Cardiovascular;;   PERCUTANEOUS CORONARY STENT INTERVENTION (PCI-S)  11/07/2013   Procedure: PERCUTANEOUS CORONARY STENT INTERVENTION (PCI-S);  Surgeon: Candyce GORMAN Reek, MD;  Location: [facility] CATH LAB;  Service: Cardiovascular;;   HPI:  63 y/o male with PMH of ETOH, GERD, PNA requiring intubation in the past, admitted 09/19/23 due to dyspnea and hypoxia as well as decreased mobility. Seen in clinic on 09/01/23 for dyspnea that  had been worsening over past few days. COPD exacerbation was treated with azithro and prednisone  at that time.  Consideration for possible aspiration with CPAP. Swallow eval ordered to r/o aspiration with swallow.    Assessment / Plan / Recommendation  Clinical Impression  Patient seen for swallow evaluation. He presents with what appears to be a normal oropharyngeal swallow based on bedside exam with efficient and complete mastication and management of  liquid and solid boluses and no overt s/s of aspiration. He does however report occassional globus sensation with solid pos suggestive of esophageal component. H/o GERD noted in chart which patient denies. Explained that this sensation could be indicative of GER. Favor post prandial aspiration with or without CPAP use however cannot r/o silent aspiration with po intake with bedside exam and patient does have a h/o COPD which increases risk. MD, if feel necessary to r/o silent aspiration, MBS would be beneficial. If not, patient may continue regular diet with general safe swallowing and reflux precautions which patient has been educated on. No further needs at bedside noted. If MBS wanted, please order. SLP Visit Diagnosis: Dysphagia, unspecified (R13.10)    Aspiration Risk       Diet Recommendation Regular;Thin liquid    Liquid Administration via: Cup;Straw Medication Administration: Whole meds with liquid Supervision: Patient able to self feed Compensations: Slow rate;Small sips/bites;Follow solids with liquid Postural Changes: Seated upright at 90 degrees;Remain upright for at least 30 minutes after po intake    Other  Recommendations Recommended Consults: Consider esophageal assessment Oral Care Recommendations: Oral care BID    Recommendations for follow up therapy are one component of a multi-disciplinary discharge planning process, led by the attending physician.  Recommendations may be updated based on patient status, additional functional criteria and insurance authorization.  Follow up Recommendations No SLP follow up         Functional Status Assessment Patient has not had a recent decline in their functional status          Swallow Study   General HPI: 63 y/o male with PMH of ETOH, GERD, PNA requiring intubation in the past, admitted 09/19/23 due to dyspnea and hypoxia as well as decreased mobility. Seen in clinic on 09/01/23 for dyspnea that had been worsening over past few  days. COPD exacerbation was treated with azithro and prednisone  at that time.  Consideration for possible aspiration with CPAP. Swallow eval ordered to r/o aspiration with swallow. Type of Study: Bedside Swallow Evaluation Previous Swallow Assessment: none Diet Prior to this Study: Regular;Thin liquids (Level 0) Temperature Spikes Noted: No Respiratory Status: Nasal cannula History of Recent Intubation: No Behavior/Cognition: Alert;Cooperative;Pleasant mood Oral Cavity Assessment: Within Functional Limits Oral Care Completed by SLP: No Oral Cavity - Dentition: Dentures, not available Vision: Functional for self-feeding Self-Feeding Abilities: Able to feed self Patient Positioning: Upright in bed Baseline Vocal Quality: Normal Volitional Cough: Strong Volitional Swallow: Able to elicit    Oral/Motor/Sensory Function Overall Oral Motor/Sensory Function: Within functional limits   Ice Chips Ice chips: Not tested   Thin Liquid Thin Liquid: Within functional limits Presentation: Straw;Self Fed    Nectar Thick Nectar Thick Liquid: Not tested   Honey Thick Honey Thick Liquid: Not tested   Puree Puree: Within functional limits Presentation: Spoon;Self Fed   Solid     Solid: Within functional limits Presentation: Self Fed     Ragen Laver MA, CCC-SLP  Jazzlyn Huizenga Meryl 09/22/2023,9:46 AM

## 2023-09-22 NOTE — Discharge Instructions (Addendum)
 Dear [name] [name],  Thank you for letting us  participate in your care. You were hospitalized for difficulty breathing. You were treated with breathing treatments and a short course of steroids. You do not need to continue the steroids or antibiotics.  Please follow up with pulmonology - they will be scheduling an appointment We are sending you home with oxygen. Please call [phone] to schedule your swallow study. Your Family Medicine doctor will schedule your esophagram. We have placed an order for your swallow study and esophagram.  You can get Delsym  over the counter for your cough.  POST-HOSPITAL & CARE INSTRUCTIONS Esophagram outpatient Take pantoprazole  40mg  twice per day Get bed risers for just the head of your bed to let gravity help keep down acid at night  [email or web address] No eating 2 hours before bed Avoid spicy foods and tomato products at dinner Wear O2 as needed if it helps your breathing Wear CPAP Will arrange followup in pulmonary/sleep office, we will call you with appointment Go to your follow up appointments (listed below)   DOCTOR'S APPOINTMENT   Future Appointments  Date Time Provider Department Center  09/26/2023 11:10 AM Alba Sharper, MD [facility]  11/10/2023  2:20 PM Jordan, Peter M, MD [facility] None  12/25/2023  1:45 PM McCaughan, Awanda BIRCH, DPM [facility]     Take care and be well!  Family Medicine Teaching Service Inpatient Team   [facility] [address]  [address] [place], [place] [address] [phone]

## 2023-09-22 NOTE — Plan of Care (Signed)

## 2023-09-22 NOTE — Evaluation (Signed)
 Modified Barium Swallow Study  Patient Details  Name: [name] [name] MRN: [id number] Date of Birth: May 15, 1961  Today's Date: 09/22/2023  Modified Barium Swallow completed.  Full report located under Chart Review in the Imaging Section.  History of Present Illness 63 y/o male with PMH of ETOH, GERD, PNA requiring intubation in the past, admitted 09/19/23 due to dyspnea and hypoxia as well as decreased mobility. Seen in clinic on 09/01/23 for dyspnea that had been worsening over past few days. COPD exacerbation was treated with azithro and prednisone  at that time.  Consideration for possible aspiration with CPAP. Swallow eval ordered to r/o aspiration with swallow.   Clinical Impression Pt's oropharyngeal swallow is within gross functional limits. He has some repetitive lingual movements when clearing purees from his oral cavity, and he takes two swallows to clear his oral cavity when eating a graham cracker. He does not have a lot of anterior hyoid movement but his overall pharyngeal strength is good with no pharyngeal residue remaining after the swallow. He penetrates with thin and nectar thick liquids before the swallow but this is transient, clearing upon completion of the swallow or on subsequent swallow (PAS 2, considered to be normal). No aspiration occurs. Recommend continuing wtih regular diet and thin liquids. Education offered about taking rest breaks during moments when he feels more short of breath, which he says he does. Esophageal precautions were also reviewed, and note that pt describes eating meals close to his bed time, then waking up in the night feeling like he is choking sometimes. Reinforced not eating late, but also trying to elevate HOB more while sleeping. Would defer additional f/u to MD pending results of esophagram.  Factors that may increase risk of adverse event in presence of aspiration [name] [name] 2021): Respiratory or GI disease  Swallow Evaluation  Recommendations Recommendations: PO diet PO Diet Recommendation: Regular;Thin liquids (Level 0) Liquid Administration via: Cup;Straw Medication Administration: Whole meds with liquid Supervision: Patient able to self-feed Swallowing strategies  : Slow rate;Small bites/sips;Follow solids with liquids (take breaks PRN for breathing, eat last meal a few hours before bedtime) Postural changes: Position pt fully upright for meals;Stay upright 30-60 min after meals Oral care recommendations: Oral care BID (2x/day) Recommended consults: Consider esophageal assessment      [name] [name]., M.A. CCC-SLP Acute Rehabilitation Services Office [phone]  Secure chat preferred  09/22/2023,2:15 PM

## 2023-09-22 NOTE — Assessment & Plan Note (Addendum)
 On Ozempic, Metformin, Lantus 14-16U, and Jardiance at home. CBGs persistently elevated in 200s-300s, likely exacerbated by steroid use.  - increase LAI to 12U - continue sensitive SSI - CBGs 4 times daily before meals and at bedtime

## 2023-09-22 NOTE — TOC Progression Note (Signed)
 Transition of Care [facility]) - Progression Note    Patient Details  Name: [name] [name] MRN: [id number] Date of Birth: 05/19/1961  Transition of [facility]) CM/SW Contact  Fonnie Crookshanks A Wilgus Deyton, LCSWA Phone Number: 09/22/2023, 3:03 PM  Clinical Narrative:     Pt was given taxi voucher to assist with transportation. Nursing informed CSW that pt's voucher was accidentally distributed to another pt, CSW had to issue second voucher to transport. Pt DC for today to his home.   No other needs identified at this time. TOC will sign off, please consult again if TOC needs arise.         Expected Discharge Plan and Services         Expected Discharge Date: 09/22/23                                     Social Determinants of Health (SDOH) Interventions SDOH Screenings   Food Insecurity: No Food Insecurity (09/20/2023)  Housing: Low Risk  (09/20/2023)  Transportation Needs: No Transportation Needs (09/20/2023)  Utilities: Not At Risk (09/20/2023)  Depression (PHQ2-9): Low Risk  (06/20/2023)  Recent Concern: Depression (PHQ2-9) - Medium Risk (05/16/2023)  Tobacco Use: Medium Risk (09/20/2023)  Health Literacy: Inadequate Health Literacy (08/11/2023)   Received from [facility]    Readmission Risk Interventions    09/22/2023   11:25 AM  Readmission Risk Prevention Plan  Post Dischage Appt Complete  Medication Screening Complete  Transportation Screening Complete

## 2023-09-22 NOTE — Progress Notes (Signed)
 Patient passed the oxygen ambulation test ; oxygen saturation at 95-97% while ambulating in the hallway on room air. Denies shortness of breath ;  MD made aware

## 2023-09-22 NOTE — Progress Notes (Addendum)
 Transition of Care [facility]) - Inpatient Brief Assessment   Patient Details  Name: [name] [name] MRN: [id number] Date of Birth: [birth year]  Transition of Care [facility]) CM/SW Contact:    Rosaline JONELLE Joe, RN Phone Number: 09/22/2023, 11:25 AM   Clinical Narrative: CM met with the patient and bedside nurse after oxygen qualification exam and the patient was able to ambulate on room air with oxygen saturations > 88% - pending RN notes at this time.  Patient does not qualify for home oxygen and patient will likely be discharged this morning to return home.  No TOC needs at this time.  09/22/23 1400 - Patient unable to obtain ride home by family.  Taxi voucher provided to the patient through [facility] department and given to charge RN.   Transition of Care Asessment: Insurance and Status: (P) Insurance coverage has been reviewed Patient has primary care physician: (P) Yes Home environment has been reviewed: (P) from home Prior level of function:: (P) Independent Prior/Current Home Services: (P) No current home services Social Drivers of Health Review: (P) SDOH reviewed interventions complete Readmission risk has been reviewed: (P) Yes Transition of care needs: (P) no transition of care needs at this time

## 2023-09-22 NOTE — Telephone Encounter (Signed)
 09/22/2023 Patient has been seen and evaluated in hospital. Should be okay for proposed procedure by [facility]. No further pulmonary testing needing prior to planned procedure.   Myrla Halsted MD PCCM

## 2023-09-22 NOTE — Telephone Encounter (Signed)
 Scheduled for HFU with Katie on 2/4. Nothing further needed.

## 2023-09-22 NOTE — Progress Notes (Signed)
 DISCHARGE NOTE  Removed PIV Discharge instructions given Patient is alert, oriented and not in distress at discharge

## 2023-09-22 NOTE — Progress Notes (Signed)
 Met examined patient with FMTS.  Full note to follow.  Recs given: Home O2 eval, concentrator if able Esophagram either here or OP; referral to GI if abnormal BID PPI Bed risers and GERD precautions for home Close OP sleep/pulm f/u Can try different inhaler combos if they make him feel better, think this is reactive airway disease from GERD so something like Breo or dulera  would work well   To put in DC instructions Esophagram either before you leave or we will schedule as outpatient Take omeprazole  20 twice per day Get bed risers for just the head of your bed to let gravity help keep down acid at night   [email or web address]  No eating 2 hours before bed Avoid spicy foods and tomato products at dinner Wear O2 as needed if it helps your breathing Wear CPAP Will arrange followup in pulmonary/sleep office, we will call you with appointment   Rolan Sharps MD PCCM

## 2023-09-22 NOTE — Telephone Encounter (Signed)
 Please see phone encounter dated 09/22/23   I have faxed Dr Michaelle Copas letter of clearance to Dr Jenne Pane at [phone].

## 2023-09-22 NOTE — Telephone Encounter (Signed)
 2-3 week f/u w/ any for hospital f/u

## 2023-09-22 NOTE — Assessment & Plan Note (Addendum)
 Documented to 86% on admission, required bipap shortly. Seems to have resolved, no additional hypoxia but still requiring 2L O2. Unclear cause of why this has happened several times now - pulmonology suspects in the setting of uncontrolled GERD and likely needs CPAP settings adjusted - Pulmonology consulted, appreciate recommendations - protonix  40mg  BID, esophagram, and home O2 - will f/u with them in clinic for CPAP setting adjustment - Prednisone  40mg  qd x5d - Doxy x5 days - oxygen saturation goal = 88%-92% - albuterol  2.5mg  neb q2h PRN - Continue home inhaler Dulera , added Ellipta for triple therapy

## 2023-09-22 NOTE — Progress Notes (Signed)
     Daily Progress Note Intern Pager: [phone]  Patient name: [name] [name] Medical record number: [id number] Date of birth: [birth year] Age: 63 y.o. Gender: male  Primary Care Provider: Delores Suzann HERO, MD Consultants: pulmonology Code Status: Full  Pt Overview and Major Events to Date:  1/8 - admitted  Assessment and Plan:  63yo male with PMHx HTN, CAD, OSA, T2DM presented with acute hypoxic respiratory failure in setting of suspected COPD exacerbation. Pending esophagram today then discharge Assessment & Plan Acute hypoxic respiratory failure (HCC) Documented to 86% on admission, required bipap shortly. Seems to have resolved, no additional hypoxia but still requiring 2L O2. Unclear cause of why this has happened several times now - pulmonology suspects in the setting of uncontrolled GERD and likely needs CPAP settings adjusted - Pulmonology consulted, appreciate recommendations - protonix  40mg  BID, esophagram, and home O2 - will f/u with them in clinic for CPAP setting adjustment - Prednisone  40mg  qd x5d - Doxy x5 days - oxygen saturation goal = 88%-92% - albuterol  2.5mg  neb q2h PRN - Continue home inhaler Dulera , added Ellipta for triple therapy Type 2 diabetes mellitus with hyperglycemia (HCC) On Ozempic , Metformin , Lantus  14-16U, and Jardiance  at home. CBGs persistently elevated in 200s-300s, likely exacerbated by steroid use.  - increase LAI to 12U - continue sensitive SSI - CBGs 4 times daily before meals and at bedtime   Chronic and Stable Problems:  HTN - Coreg , Lisinopril   CAD - ASA, Effient   OSA - needs cpap at night    FEN/GI: carb modified PPx: lovenox  Dispo:Home pending clinical improvement .   Subjective:  NAEON. Feels like breathing as improved but still reports productive cough, unchanged from baseline  Objective: Temp:  [97.7 F (36.5 C)-98.2 F (36.8 C)] 98.1 F (36.7 C) (01/10 0813) Pulse Rate:  [42-68] 62 (01/10 0813) Resp:  [18-20] 18 (01/10  0813) BP: (119-145)/(48-74) 145/74 (01/10 0813) SpO2:  [94 %-98 %] 98 % (01/10 0813) Physical Exam: General: NAD, resting comfortably Cardiovascular: RRR, no m/r/g Respiratory: normal work of breathing on 2LNC, CTAB full exam limited 2/2 body habitus Abdomen: Normal bowel sounds, soft, non-tender Extremities: No swelling BLE  Laboratory: Most recent CBC Lab Results  Component Value Date   WBC 14.4 (H) 09/21/2023   HGB 14.5 09/21/2023   HCT 42.8 09/21/2023   MCV 85.6 09/21/2023   PLT 272 09/21/2023   Most recent BMP    Latest Ref Rng & Units 09/21/2023    6:19 AM  BMP  Glucose 70 - 99 mg/dL 786   BUN 8 - 23 mg/dL 18   Creatinine 9.38 - 1.24 mg/dL 9.37   Sodium 864 - 854 mmol/L 134   Potassium 3.5 - 5.1 mmol/L 3.5   Chloride 98 - 111 mmol/L 102   CO2 22 - 32 mmol/L 23   Calcium  8.9 - 10.3 mg/dL 9.0     Mica Ramdass, DO 09/22/2023, 8:51 AM  PGY-1, [facility] FPTS Intern pager: [phone], text pages welcome Secure chat group [facility] Teaching Service

## 2023-09-22 NOTE — Progress Notes (Signed)
 09/22/2023   I have seen and evaluated the patient for recurrent respiratory issues.   S:  Continues to improve.   O: Blood pressure 96/66, pulse 64, temperature 97.6 F (36.4 C), temperature source Oral, resp. rate 17, height 5' 3 (1.6 m), weight 81.6 kg, SpO2 94 %.    No distress Malampatti 3 Ext warm Moves to command Poor insight  BMP ok CBC stable   A:  Recurrent respiratory admissions that I wonder are just GERD-associated reactive airway disease flares.  He does admit also to some postprandial cough/dyspnea that is delayed and sounds esophageal in onset.  Cleared clinically by speech.   P:  Home O2 eval, concentrator if able Esophagram either here or OP; referral to GI if abnormal BID PPI Bed risers and GERD precautions for home Close OP sleep/pulm f/u Can try different inhaler combos if they make him feel better, think this is reactive airway disease from GERD so something like Breo or dulera  would work well Will arrange 2-3 weeks f/u in office   Will be available PRN  Rolan Sharps MD [facility] Prefer epic messenger for cross cover needs If after hours, please call E-link

## 2023-09-22 NOTE — Discharge Summary (Addendum)
 Family Medicine Teaching [facility] Discharge Summary  Patient name: [name] [name] Medical record number: [id number] Date of birth: 10/28/1960 Age: 63 y.o. Gender: male Date of Admission: 09/19/2023  Date of Discharge: 09/22/23 Admitting Physician: Laurier Hugger, MD  Primary Care Provider: Delores Suzann HERO, MD Consultants: pulmonology  Indication for Hospitalization: oxygen requirement  Discharge Diagnoses/Problem List:  Principal Problem for Admission: acute hypoxic respiratory failure Other Problems addressed during stay:  Principal Problem:   Acute hypoxic respiratory failure (HCC) Active Problems:   COPD, moderate (HCC)   Acute exacerbation of chronic obstructive pulmonary disease (COPD) (HCC)   Type 2 diabetes mellitus with hyperglycemia [facility])    Brief Hospital Course:  [name] [name] is a 63 y.o.male with a history of HTN, CAD, OSA, T2DM who was admitted to the [facility] Medicine Teaching Service at [facility] for acute hypoxic respiratory failure. His hospital course is detailed below:  Acute Hypoxic Respiratory Failure Thought to be in the setting of uncontrolled GERD/CPAP settings.  Pulmonology consulted and advised starting PPI twice daily, outpatient esophagram and modified barium swallow study, and follow-up with them in their office for CPAP setting adjustment.  Patient was weaned to room air prior to discharge and was able to ambulate without difficulty. Was initially started on prednisone  40 mg x 5 days and doxycycline  x 5 days for presumed COPD exacerbation, however upon further chart review pulmonology does not suspect that he actually has COPD based off previous spirometry.  He received 3 days of both medications and they were discontinued on discharge.   Other chronic conditions were medically managed with home medications and formulary alternatives as necessary (HTN, CAD, OSA)  PCP Follow-up Recommendations: Needs pulmonology follow-up F/u esophagram results (completed  in hospital, pending read) Was supposed to get oxygen prior to discharge for PRN at home but did not qualify to be sent home with it - may need new DME order if still requiring   Disposition: home  Discharge Condition: stable  Discharge Exam:  Vitals:   09/22/23 0813 09/22/23 1121  BP: (!) 145/74   Pulse: 62   Resp: 18   Temp: 98.1 F (36.7 C)   SpO2: 98% 97%   General: well appearing, NAD Cardiovascular: RRR, no m/r/g Respiratory: normal work of breathing on RA, CTAB Abdomen: Normal bowel sounds, soft, non-tender Extremities: No swelling BLE  Significant Procedures: none  Significant Labs and Imaging:  Recent Labs  Lab 09/21/23 0619 09/22/23 0800  WBC 14.4* 11.4*  HGB 14.5 14.2  HCT 42.8 42.3  PLT 272 229   Recent Labs  Lab 09/21/23 0619 09/22/23 0800  NA 134* 136  K 3.5 3.6  CL 102 101  CO2 23 25  GLUCOSE 213* 200*  BUN 18 13  CREATININE 0.62 0.64  CALCIUM  9.0 8.5*   CXR 09/20/23 IMPRESSION: No active disease.  Results/Tests Pending at Time of Discharge: Esophagram and MBS  Discharge Medications:  Allergies as of 09/22/2023       Reactions   Shellfish Allergy Nausea And Vomiting   OYSTERS        Medication List     STOP taking these medications    Ciprodex  OTIC suspension Generic drug: ciprofloxacin -dexamethasone    traMADol  50 MG tablet Commonly known as: ULTRAM        TAKE these medications    Accu-Chek Guide test strip Generic drug: glucose blood Use as instructed   aspirin  81 MG chewable tablet Chew 1 tablet (81 mg total) by mouth daily.   atorvastatin  80 MG  tablet Commonly known as: LIPITOR  TAKE 1 TABLET BY MOUTH DAILY What changed: when to take this   carvedilol  3.125 MG tablet Commonly known as: COREG  TAKE 1 TABLET BY MOUTH TWICE A DAY WITH A MEAL   citalopram  20 MG tablet Commonly known as: CELEXA  Take 1 tablet (20 mg total) by mouth daily.   Dulera  200-5 MCG/ACT Aero Generic drug: mometasone -formoterol  Inhale  2 puffs into the lungs in the morning and at bedtime.   Fish Oil  1000 MG Caps Take 1 capsule (1,000 mg total) by mouth daily.   FreeStyle Libre 2 Sensor Misc APPLY SENSOR TO SKIN FOR CONTINUOUS BLOOD SUGAR MONITORING, REPLACE EVERY 14 DAYS   gentamicin  cream 0.1 % Commonly known as: GARAMYCIN  Apply to right foot once daily. What changed:  how much to take how to take this when to take this reasons to take this   Jardiance  25 MG Tabs tablet Generic drug: empagliflozin  TAKE 1 TABLET BY MOUTH DAILY   Lantus  SoloStar 100 UNIT/ML Solostar Pen Generic drug: insulin  glargine Inject 14-16 Units into the skin daily.   lisinopril  2.5 MG tablet Commonly known as: ZESTRIL  TAKE 1 TABLET BY MOUTH TWICE A DAY   Melatonin 2.5 MG Caps Take 5 mg by mouth at bedtime.   metFORMIN  1000 MG tablet Commonly known as: GLUCOPHAGE  TAKE ONE TABLET BY MOUTH TWICE A DAY WITH MEALS   multivitamin tablet Take 1 tablet by mouth daily.   nitroGLYCERIN  0.4 MG SL tablet Commonly known as: NITROSTAT  DISSOLVE 1 TABLET UNDER THE TONGUE FOR CHEST PAIN. IF PAIN REMAINS AFTER 5 MINUTES, CALL 911 AND REPEAT DOSE. MAX 3 TABLETS IN 15 MINUTES   NON FORMULARY Pt uses a c-pap nightly   Ozempic  (2 MG/DOSE) 8 MG/3ML Sopn Generic drug: Semaglutide  (2 MG/DOSE) Inject 2 mg into the skin once a week. What changed: additional instructions   pantoprazole  40 MG tablet Commonly known as: PROTONIX  Take 1 tablet (40 mg total) by mouth 2 (two) times daily.   Pen Needles 32G X 4 MM Misc Use as directed with insulin  four times daily   Droplet Pen Needles 29G X Misc Generic drug: Insulin  Pen Needle USE ONCE DAILY WITH VICTOZA  PEN   prasugrel  10 MG Tabs tablet Commonly known as: EFFIENT  Take 1 tablet (10 mg total) by mouth daily.               Durable Medical Equipment  (From admission, onward)           Start     Ordered   09/22/23 0954  For home use only DME oxygen  Once       Question  Answer Comment  Length of Need Lifetime   Mode or (Route) Nasal cannula   Liters per Minute 2   Frequency Continuous (stationary and portable oxygen unit needed)   Oxygen delivery system Gas      09/22/23 0954            Discharge Instructions: Please refer to Patient Instructions section of EMR for full details.  Patient was counseled important signs and symptoms that should prompt return to medical care, changes in medications, dietary instructions, activity restrictions, and follow up appointments.   Follow-Up Appointments:  Follow-up Information     Alba Sharper, MD Follow up on 09/26/2023.   Specialty: Family Medicine Why: 11:10 AM Contact information: [address] [place] [address] [phone]  Everhart, Kirstie, DO 09/22/2023, 12:36 PM PGY-1, [facility]   Upper Level Addendum: Reviewed the above note, making necessary revisions as appropriate. I agree with the medical decision making and physical exam as noted above. Gladis Church, DO PGY-2 [facility] Family [facility] Residency

## 2023-09-25 ENCOUNTER — Telehealth: Payer: Self-pay

## 2023-09-25 NOTE — Transitions of Care (Post Inpatient/ED Visit) (Signed)
   09/25/2023  Name: [name] [name] MRN: [id number] DOB: 15-Aug-1961  Today's TOC FU Call Status: Today's TOC FU Call Status:: Unsuccessful Call (1st Attempt) Unsuccessful Call (1st Attempt) Date: 09/25/23  Attempted to reach the patient regarding the most recent Inpatient/ED visit.  Follow Up Plan: Additional outreach attempts will be made to reach the patient to complete the Transitions of Care (Post Inpatient/ED visit) call.   Barnie Gowda RN, BSN, CCM RN Care Manager  Transitions of Care  [facility] - [facility]  [phone]

## 2023-09-26 ENCOUNTER — Telehealth: Payer: Self-pay

## 2023-09-26 ENCOUNTER — Ambulatory Visit: Payer: Medicaid Other | Admitting: Family Medicine

## 2023-09-26 ENCOUNTER — Encounter: Payer: Self-pay | Admitting: Family Medicine

## 2023-09-26 VITALS — BP 116/48 | HR 62 | Ht 67.0 in | Wt 275.0 lb

## 2023-09-26 DIAGNOSIS — J45909 Unspecified asthma, uncomplicated: Secondary | ICD-10-CM | POA: Diagnosis present

## 2023-09-26 DIAGNOSIS — S301XXA Contusion of abdominal wall, initial encounter: Secondary | ICD-10-CM

## 2023-09-26 NOTE — Assessment & Plan Note (Signed)
 Continue Dulera and albuterol.  Today respiratory status is stable on room air.  No further need for home oxygen evaluation.  Has pulmonology follow-up on February 4.

## 2023-09-26 NOTE — Transitions of Care (Post Inpatient/ED Visit) (Signed)
   09/26/2023  Name: [name] [name] MRN: [id number] DOB: 10-Apr-1961  Today's TOC FU Call Status: Today's TOC FU Call Status:: Unsuccessful Call (2nd Attempt) Unsuccessful Call (2nd Attempt) Date: 09/26/23  Attempted to reach the patient regarding the most recent Inpatient/ED visit.  Follow Up Plan: Additional outreach attempts will be made to reach the patient to complete the Transitions of Care (Post Inpatient/ED visit) call.   Barnie Gowda RN, BSN, CCM RN Care Manager  Transitions of Care  [facility] - [facility]  [phone]

## 2023-09-26 NOTE — Patient Instructions (Signed)
 It was great to see you! Thank you for allowing me to participate in your care!  Our plans for today:  -In a different spot where you do not have bruising, if you have new bruising with it please stop and make a follow-up appointment. -Please make sure to go to your pulmonology follow-up. -Please continue taking your Dulera  and pantoprazole .   Please arrive 15 minutes PRIOR to your next scheduled appointment time! If you do not, this affects OTHER patients' care.  Take care and seek immediate care sooner if you develop any concerns.   Ozell Provencal, MD, PGY-2 [facility] 11:21 AM 09/26/2023  [facility]

## 2023-09-26 NOTE — Progress Notes (Signed)
    SUBJECTIVE:   CHIEF COMPLAINT / HPI: hospital f/u  Hospitalized 1 week ago for acute Evoxac respiratory failure thought to be due to uncontrolled GERD and incorrect CPAP settings.  PCP Follow-up Recommendations: Needs pulmonology follow-up Has follow-up 02/04 F/u esophagram results (completed in hospital, pending read) Normal Was supposed to get oxygen prior to discharge for PRN at home but did not qualify to be sent home with it - may need new DME order if still requiring Saturating well on RA, no concerns. Feels his breathing is doing very well with Dulera .   Patient notes he has had significant abdominal bruising since leaving the hospital.  Notes that this acutely worsened while he was in the hospital and had multiple insulin  injections.  Also takes Ozempic  weekly and give shots in his abdomen.  The bruising is exactly where he receives these injections.  He does not have any pain unless he presses directly on them.  PERTINENT  PMH / PSH: HTN, CAD, OSA, COPD, T2DM  OBJECTIVE:   BP (!) 116/48   Pulse 62   Ht 5' 7 (1.702 m)   Wt 275 lb (124.7 kg)   SpO2 96%   BMI 43.07 kg/m   General: NAD, well appearing Neuro: A&O Cardiovascular: RRR, no murmurs, no peripheral edema Respiratory: normal WOB on RA, CTAB, no wheezes, ronchi or rales Extremities: Moving all 4 extremities equally   Esophogram 09/22/23 -Minimal esophageal dysmotility, likely presbyesophagus.  -No esophageal stricture or gastroesophageal reflux identified.  ASSESSMENT/PLAN:   Assessment & Plan Reactive airway disease without complication, unspecified asthma severity, unspecified whether persistent Continue Dulera  and albuterol .  Today respiratory status is stable on room air.  No further need for home oxygen evaluation.  Has pulmonology follow-up on February 4. Contusion of abdominal wall, initial encounter Bruising secondary to medication administration.  Unfortunately patient is on aspirin  and  prasugrel  indefinitely for coronary stenting.  Discussed administering Ozempic  in different subcutaneous spot on abdomen.  If severe bruising occurs in different location discussed following up with our clinic for further management.  Patient agreeable to plan.  Return in about 2 months (around 11/24/2023).  [name] Provencal, MD [facility]

## 2023-09-27 ENCOUNTER — Encounter (HOSPITAL_COMMUNITY): Admission: RE | Payer: Self-pay | Source: Home / Self Care

## 2023-09-27 ENCOUNTER — Telehealth: Payer: Self-pay

## 2023-09-27 ENCOUNTER — Ambulatory Visit (HOSPITAL_COMMUNITY): Admission: RE | Admit: 2023-09-27 | Payer: Medicaid Other | Source: Home / Self Care | Admitting: Otolaryngology

## 2023-09-27 SURGERY — TYMPANOPLASTY, WITH MASTOIDECTOMY
Anesthesia: General | Laterality: Left

## 2023-09-27 NOTE — Transitions of Care (Post Inpatient/ED Visit) (Signed)
   09/27/2023  Name: [name] [name] MRN: [id number] DOB: [birth year]  Today's TOC FU Call Status: Today's TOC FU Call Status:: Unsuccessful Call (3rd Attempt) Unsuccessful Call (3rd Attempt) Date: 09/27/23  Attempted to reach the patient regarding the most recent Inpatient/ED visit.  Follow Up Plan: No further outreach attempts will be made at this time. We have been unable to contact the patient.  Irineo Manns RN, BSN, CCM RN Care Manager  Transitions of Care  [facility] - [facility]  [phone]

## 2023-10-17 ENCOUNTER — Ambulatory Visit (INDEPENDENT_AMBULATORY_CARE_PROVIDER_SITE_OTHER): Payer: Medicaid Other | Admitting: Nurse Practitioner

## 2023-10-17 ENCOUNTER — Encounter: Payer: Self-pay | Admitting: Nurse Practitioner

## 2023-10-17 VITALS — BP 140/74 | HR 74 | Temp 98.5°F | Ht 67.0 in | Wt 273.4 lb

## 2023-10-17 DIAGNOSIS — J45909 Unspecified asthma, uncomplicated: Secondary | ICD-10-CM | POA: Diagnosis not present

## 2023-10-17 DIAGNOSIS — Z87891 Personal history of nicotine dependence: Secondary | ICD-10-CM

## 2023-10-17 DIAGNOSIS — G4733 Obstructive sleep apnea (adult) (pediatric): Secondary | ICD-10-CM

## 2023-10-17 DIAGNOSIS — K219 Gastro-esophageal reflux disease without esophagitis: Secondary | ICD-10-CM | POA: Diagnosis not present

## 2023-10-17 MED ORDER — OMEPRAZOLE 20 MG PO CPDR: 20.0000 mg | DELAYED_RELEASE_CAPSULE | Freq: Two times a day (BID) | ORAL | 5 refills | Status: DC

## 2023-10-17 NOTE — Progress Notes (Signed)
 @Patient  ID: [name] [name], male    DOB: 04/05/1961, 63 y.o.   MRN: [id number]  Chief Complaint  Patient presents with   Hospitalization Follow-up    SOB both with exertion and while at rest.  Post hospital visit.    Referring provider: Delores Suzann HERO, MD  HPI: 63 year old male, former smoker followed for COPD/asthma.  He is a patient of Dr. Jiles and last seen in office 07/07/2023.  Past medical history significant for CAD status post PTCA, chronic back pain, depression, GERD, HLD, hypertension, migraines, history of NSTEMI 2015, OSA on CPAP (2013), DM 2, history of EtOH abuse, anxiety.   TEST/EVENTS:  2019 spirometry: FVC 56, FEV1 53, ratio 72 04/11/2023 CTA chest: Central airway thickening with increased soft tissue peribronchovascular interstitium; nonspecific but can be seen in setting of bronchitis.  No airspace consolidation.  New scar versus subsegmental atelectasis within left upper lobe.  No suspicious pulmonary nodule or mass.  Unchanged perifissural nodules within right middle lobe, likely intrapulmonary lymph nodes.  Unchanged subpleural nodule within the periphery of the left lower lobe measuring 4 mm. 09/20/2023 CXR: No active disease; clear lungs 09/22/2023 barium swallow study: Oropharyngeal swallow within gross functional limits.  No overt aspiration 09/22/2023 esophagram: Minimal esophageal dysmotility  07/07/2023: OV with Dr. Theophilus.  History of bronchitis and sleep apnea, presents for follow up of breathing issues. Reports two hospital admissions in June and July 2024 for similar symptoms of COPD exacerbation.  Attributes the initial exposure to [employer] bus.  In July 2024, admitted for 4 days at [facility] with negative CTA.  Treated with bronchodilator therapy, IV steroids, inhalers, physical therapy with improvement.  Has a significant smoking history.  Reports sensitivity to heat which exacerbates his breathing issue.  No known occupational exposures.  Uses Dulera  and  albuterol  daily.  Has a CPAP for sleep apnea which he uses nightly.  Return in 6 months for PFT.  On lung cancer screening protocol per his primary.  Continue current regimen.  10/17/2023: Today-follow-up Patient presents today for hospital follow-up.  He has had a couple admissions and ED visits since he was here last.  He was admitted in November for AECOPD and acute respiratory failure in [place] .  Discharged on room air.  He was readmitted again at [facility] 09/19/2023 to 09/22/2023 for acute respiratory failure secondary.  Felt as though uncontrolled GERD and aspiration with CPAP were contributing factors.  Alcohol level was noted to be elevated upon admission.  PCCM was consulted and started on PPI twice daily.  Outpatient esophagram and modified barium swallow was ordered.  He was weaned to room air prior to discharge.  His spirometry testing actually did not show any formal obstruction.  He was treated with brief course of prednisone  and doxycycline  but these were discontinued on discharge. He had his esophagram and modified barium swallow.  Esophagram showed mild esophageal dysmotility.  Barium swallow did not show any overt aspiration.  Discussed the use of AI scribe software for clinical note transcription with the patient, who gave verbal consent to proceed.  History of Present Illness   [name] [name] is a 63 year old male who presents with improved cough and breathing issues.  Since his recent hospital discharge, his breathing has returned to normal. He has been using omeprazole , taking one capsule in the morning and one at night, which has significantly improved his cough. He describes his cough as now 'very, very tiny.'  He has a history of poorly controlled heartburn, which  is believed to be contributing to his respiratory symptoms. He elevates the head of his bed at night using a wedge pillow.   He continues to use Dulera , taking two puffs twice daily, which he feels helps with his  breathing. No current wheezing, and he used to experience 'hard breathing' but not anymore.  He quit drinking alcohol nearly three years ago, on April 12.   Wears his CPAP nightly. Feels like it helps him sleep more restfully. Denies drowsy driving.       Allergies  Allergen Reactions   Shellfish Allergy Nausea And Vomiting    OYSTERS    Immunization History  Administered Date(s) Administered   Influenza Split 08/24/2012   Influenza, Seasonal, Injecte, Preservative Fre 06/06/2023   Influenza,inj,Quad PF,6+ Mos 07/31/2013, 07/08/2014, 05/27/2015, 05/23/2016, 06/07/2017, 05/24/2018, 05/24/2019, 05/19/2020, 05/18/2021, 06/16/2022   PFIZER(Purple Top)SARS-COV-2 Vaccination 12/21/2019, 01/15/2020, 09/19/2020   Pfizer(Comirnaty)Fall Seasonal Vaccine 12 years and older 06/20/2023   Pneumococcal Polysaccharide-23 01/19/2012, 03/01/2017   Tdap 08/24/2012, 10/21/2019    Past Medical History:  Diagnosis Date   Alcoholism (HCC)    Anxiety    Arthritis    knees   Asthma    CAD S/P percutaneous coronary angioplasty 01/18/12; 10/2013   a. pRCA 3.5 x 18 vision BMS - 4.2 mm; b. 2/'15: mCx 3.5 x 12  Rebel BMS (3.6-3.7 mm)   Chronic back pain    mid/lower (08/01/2014)   Colon polyps 02/18/2020   May 2021 - 11 polyps that I removed during your recent procedure were proven to be adenomatous. Fortunately all of these polyps were extremely small in size.  These are considered to be pre-cancerous polyps that may have grown into cancers if they had not been removed.  Studies show that at least 20% of women over age 52 and 30% of men over age 43 have pre-cancerous polyps. Based on current nation   COPD (chronic obstructive pulmonary disease) (HCC)    I'm seeing COPD dr now; don't know if I've got it (08/01/2014)   Depression    Dyspnea    GERD (gastroesophageal reflux disease)    Hyperlipidemia    Hypertension    Migraines    once in awhile (08/01/2014); no current problems as of 09/18/23 per  pt.   Non-ST elevation myocardial infarction (NSTEMI) (HCC) 10/2013   OSA on CPAP 01/19/2012   Pneumonia    Sleep apnea    uses CPAP nightly   Type II diabetes mellitus (HCC)     Tobacco History: Social History   Tobacco Use  Smoking Status Former   Current packs/day: 0.00   Average packs/day: 1 pack/day for 42.4 years (42.4 ttl pk-yrs)   Types: Cigarettes   Start date: 09/13/1975   Quit date: 01/25/2018   Years since quitting: 5.7  Smokeless Tobacco Former   Types: Chew  Tobacco Comments   Bupropion  helping   Counseling given: Not Answered Tobacco comments: Bupropion  helping   Outpatient Medications Prior to Visit  Medication Sig Dispense Refill   aspirin  81 MG chewable tablet Chew 1 tablet (81 mg total) by mouth daily. 180 tablet 2   atorvastatin  (LIPITOR ) 80 MG tablet TAKE 1 TABLET BY MOUTH DAILY (Patient taking differently: Take 80 mg by mouth at bedtime.) 90 tablet 3   carvedilol  (COREG ) 3.125 MG tablet TAKE 1 TABLET BY MOUTH TWICE A DAY WITH A MEAL 180 tablet 3   citalopram  (CELEXA ) 20 MG tablet Take 1 tablet (20 mg total) by mouth daily. 90 tablet 1  Continuous Glucose Sensor (FREESTYLE LIBRE 2 SENSOR) MISC APPLY SENSOR TO SKIN FOR CONTINUOUS BLOOD SUGAR MONITORING, REPLACE EVERY 14 DAYS 2 each 6   DROPLET PEN NEEDLES 29G X MISC USE ONCE DAILY WITH VICTOZA  PEN 100 each 1   gentamicin  cream (GARAMYCIN ) 0.1 % Apply to right foot once daily. (Patient taking differently: Apply 1 Application topically daily as needed (irritation). Apply to right foot once daily.) 30 g 0   glucose blood (ACCU-CHEK GUIDE) test strip Use as instructed 100 each 12   insulin  glargine (LANTUS  SOLOSTAR) 100 UNIT/ML Solostar Pen Inject 14-16 Units into the skin daily.     Insulin  Pen Needle (PEN NEEDLES) 32G X 4 MM MISC Use as directed with insulin  four times daily 100 each 12   JARDIANCE  25 MG TABS tablet TAKE 1 TABLET BY MOUTH DAILY 90 tablet 3   lisinopril  (ZESTRIL ) 2.5 MG tablet TAKE 1  TABLET BY MOUTH TWICE A DAY 180 tablet 3   Melatonin 2.5 MG CAPS Take 5 mg by mouth at bedtime.     metFORMIN  (GLUCOPHAGE ) 1000 MG tablet TAKE ONE TABLET BY MOUTH TWICE A DAY WITH MEALS 180 tablet 3   mometasone -formoterol  (DULERA ) 200-5 MCG/ACT AERO Inhale 2 puffs into the lungs in the morning and at bedtime. 39 g 0   Multiple Vitamin (MULTIVITAMIN) tablet Take 1 tablet by mouth daily.     nitroGLYCERIN  (NITROSTAT ) 0.4 MG SL tablet DISSOLVE 1 TABLET UNDER THE TONGUE FOR CHEST PAIN. IF PAIN REMAINS AFTER 5 MINUTES, CALL 911 AND REPEAT DOSE. MAX 3 TABLETS IN 15 MINUTES 100 tablet 0   NON FORMULARY Pt uses a c-pap nightly     Omega-3 Fatty Acids  (FISH OIL ) 1000 MG CAPS Take 1 capsule (1,000 mg total) by mouth daily.     Semaglutide , 2 MG/DOSE, (OZEMPIC , 2 MG/DOSE,) 8 MG/3ML SOPN Inject 2 mg into the skin once a week. (Patient taking differently: Inject 2 mg into the skin once a week. Monday) 18 mL 3   VENTOLIN  HFA 108 (90 Base) MCG/ACT inhaler Inhale 2 puffs into the lungs every 6 (six) hours as needed.     pantoprazole  (PROTONIX ) 40 MG tablet Take 1 tablet (40 mg total) by mouth 2 (two) times daily. 60 tablet 0   prasugrel  (EFFIENT ) 10 MG TABS tablet Take 1 tablet (10 mg total) by mouth daily. 90 tablet 3   No facility-administered medications prior to visit.     Review of Systems:   Constitutional: No weight loss or gain, night sweats, fevers, chills, fatigue, or lassitude. HEENT: No headaches, difficulty swallowing, tooth/dental problems, or sore throat. No sneezing, itching, ear ache, nasal congestion, or post nasal drip CV:  No chest pain, orthopnea, PND, swelling in lower extremities, anasarca, dizziness, palpitations, syncope Resp: +improved shortness of breath with exertion; resolved cough. No excess mucus or change in color of mucus. No hemoptysis. No wheezing.  No chest wall deformity GI:  +improved heartburn, indigestion. No abdominal pain, nausea, vomiting, diarrhea, change in bowel  habits, loss of appetite, bloody stools.  GU: No dysuria, change in color of urine, urgency or frequency.  No flank pain, no hematuria  Skin: No rash, lesions, ulcerations MSK:  No joint pain or swelling.  Neuro: No dizziness or lightheadedness.  Psych: No depression or anxiety. Mood stable.     Physical Exam:  BP (!) 140/74 (BP Location: Right Arm, Patient Position: Sitting, Cuff Size: Large)   Pulse 74   Temp 98.5 F (36.9 C) (Oral)  Ht 5' 7 (1.702 m)   Wt 273 lb 6.4 oz (124 kg)   SpO2 96%   BMI 42.82 kg/m   GEN: Pleasant, interactive, well-kempt; morbidly obese; in no acute distress HEENT:  Normocephalic and atraumatic. PERRLA. Sclera white. Nasal turbinates pink, moist and patent bilaterally. No rhinorrhea present. Oropharynx pink and moist, without exudate or edema. No lesions, ulcerations, or postnasal drip.  NECK:  Supple w/ fair ROM. No JVD present. Normal carotid impulses w/o bruits. Thyroid symmetrical with no goiter or nodules palpated. No lymphadenopathy.   CV: RRR, no m/r/g, no peripheral edema. Pulses intact, +2 bilaterally. No cyanosis, pallor or clubbing. PULMONARY:  Unlabored, regular breathing. Clear bilaterally A&P w/o wheezes/rales/rhonchi. No accessory muscle use.  GI: BS present and normoactive. Soft, non-tender to palpation. No organomegaly or masses detected. MSK: No erythema, warmth or tenderness. Cap refil <2 sec all extrem. No deformities or joint swelling noted.  Neuro: A/Ox3. No focal deficits noted.   Skin: Warm, no lesions or rashe Psych: Normal affect and behavior. Judgement and thought content appropriate.     Lab Results:  CBC    Component Value Date/Time   WBC 11.4 (H) 09/22/2023 0800   RBC 4.90 09/22/2023 0800   HGB 14.2 09/22/2023 0800   HCT 42.3 09/22/2023 0800   PLT 229 09/22/2023 0800   MCV 86.3 09/22/2023 0800   MCH 29.0 09/22/2023 0800   MCHC 33.6 09/22/2023 0800   RDW 14.9 09/22/2023 0800   LYMPHSABS 0.7 09/20/2023 0308    MONOABS 0.2 09/20/2023 0308   EOSABS 0.0 09/20/2023 0308   BASOSABS 0.1 09/20/2023 0308    BMET    Component Value Date/Time   NA 136 09/22/2023 0800   NA 141 10/25/2022 0946   K 3.6 09/22/2023 0800   CL 101 09/22/2023 0800   CO2 25 09/22/2023 0800   GLUCOSE 200 (H) 09/22/2023 0800   BUN 13 09/22/2023 0800   BUN 10 10/25/2022 0946   CREATININE 0.64 09/22/2023 0800   CREATININE 0.71 03/29/2016 1227   CALCIUM  8.5 (L) 09/22/2023 0800   GFRNONAA >60 09/22/2023 0800   GFRAA >60 01/29/2020 1900    BNP    Component Value Date/Time   BNP 27.4 09/19/2023 2358     Imaging:  DG ESOPHAGUS W SINGLE CM (SOL OR THIN BA) Result Date: 09/22/2023 CLINICAL DATA:  63 year old male with report of shortness of breath, cough. Barium swallow requested. EXAM: ESOPHAGUS/BARIUM SWALLOW/TABLET STUDY TECHNIQUE: Single contrast examination was performed using thin liquid barium. This exam was performed by Solmon Ku PA-C, and was supervised and interpreted by Rockey Kilts, MD. FLUOROSCOPY: Radiation Exposure Index (as provided by the fluoroscopic device): 28.8 mGy Kerma COMPARISON:  None Available. FINDINGS: Swallowing: Appears normal. No vestibular penetration or aspiration seen. Pharynx: Unremarkable. Esophagus: Normal appearance.  No masses, lesions, or ulcers. Esophageal motility: Tertiary contractions noted without impact on motility. Hiatal Hernia: None. Gastroesophageal reflux: None visualized either spontaneously or able to be induced. Ingested 13mm barium tablet: Passed normally Other: Limited exam due to body habitus and mobility. IMPRESSION: Minimal esophageal dysmotility, likely presbyesophagus. No esophageal stricture or gastroesophageal reflux identified. Electronically Signed   By: Rockey Kilts M.D.   On: 09/22/2023 14:21   DG Swallowing Func-Speech Pathology Result Date: 09/22/2023 Table formatting from the original result was not included. Modified Barium Swallow Study Patient Details Name:  [name] [name] MRN: [id number] Date of Birth: October 29, 1960 Today's Date: 09/22/2023 HPI/PMH: HPI: 63 y/o male with PMH of ETOH, GERD, PNA requiring  intubation in the past, admitted 09/19/23 due to dyspnea and hypoxia as well as decreased mobility. Seen in clinic on 09/01/23 for dyspnea that had been worsening over past few days. COPD exacerbation was treated with azithro and prednisone  at that time.  Consideration for possible aspiration with CPAP. Swallow eval ordered to r/o aspiration with swallow. Clinical Impression: Clinical Impression: Pt's oropharyngeal swallow is within gross functional limits. He has some repetitive lingual movements when clearing purees from his oral cavity, and he takes two swallows to clear his oral cavity when eating a graham cracker. He does not have a lot of anterior hyoid movement but his overall pharyngeal strength is good with no pharyngeal residue remaining after the swallow. He penetrates with thin and nectar thick liquids before the swallow but this is transient, clearing upon completion of the swallow or on subsequent swallow (PAS 2, considered to be normal). No aspiration occurs. Recommend continuing wtih regular diet and thin liquids. Education offered about taking rest breaks during moments when he feels more short of breath, which he says he does. Esophageal precautions were also reviewed, and note that pt describes eating meals close to his bed time, then waking up in the night feeling like he is choking sometimes. Reinforced not eating late, but also trying to elevate HOB more while sleeping. Would defer additional f/u to MD pending results of esophagram. Factors that may increase risk of adverse event in presence of aspiration [name] & [name] 2021): Factors that may increase risk of adverse event in presence of aspiration [name] & [name] 2021): Respiratory or GI disease Recommendations/Plan: Swallowing Evaluation Recommendations Swallowing Evaluation Recommendations  Recommendations: PO diet PO Diet Recommendation: Regular; Thin liquids (Level 0) Liquid Administration via: Cup; Straw Medication Administration: Whole meds with liquid Supervision: Patient able to self-feed Swallowing strategies  : Slow rate; Small bites/sips; Follow solids with liquids (take breaks PRN for breathing, eat last meal a few hours before bedtime) Postural changes: Position pt fully upright for meals; Stay upright 30-60 min after meals Oral care recommendations: Oral care BID (2x/day) Recommended consults: Consider esophageal assessment Treatment Plan Treatment Plan Treatment recommendations: No treatment recommended at this time Follow-up recommendations: No SLP follow up Functional status assessment: Patient has not had a recent decline in their functional status. Recommendations Recommendations for follow up therapy are one component of a multi-disciplinary discharge planning process, led by the attending physician.  Recommendations may be updated based on patient status, additional functional criteria and insurance authorization. Assessment: Orofacial Exam: Orofacial Exam Oral Cavity - Dentition: Dentures, not available Anatomy: Anatomy: Suspected cervical osteophytes Boluses Administered: Boluses Administered Boluses Administered: Thin liquids (Level 0); Mildly thick liquids (Level 2, nectar thick); Moderately thick liquids (Level 3, honey thick); Puree; Solid  Oral Impairment Domain: Oral Impairment Domain Lip Closure: No labial escape Tongue control during bolus hold: Cohesive bolus between tongue to palatal seal Bolus preparation/mastication: Timely and efficient chewing and mashing Bolus transport/lingual motion: Repetitive/disorganized tongue motion Oral residue: Residue collection on oral structures Location of oral residue : Tongue Initiation of pharyngeal swallow : Pyriform sinuses  Pharyngeal Impairment Domain: Pharyngeal Impairment Domain Soft palate elevation: No bolus between soft  palate (SP)/pharyngeal wall (PW) Laryngeal elevation: Complete superior movement of thyroid cartilage with complete approximation of arytenoids to epiglottic petiole Anterior hyoid excursion: Partial anterior movement Epiglottic movement: Complete inversion Laryngeal vestibule closure: Complete, no air/contrast in laryngeal vestibule Pharyngeal stripping wave : Present - complete Pharyngeal contraction (A/P view only): N/A Pharyngoesophageal segment opening: Complete distension and complete  duration, no obstruction of flow Tongue base retraction: No contrast between tongue base and posterior pharyngeal wall (PPW) Pharyngeal residue: Complete pharyngeal clearance Location of pharyngeal residue: N/A  Esophageal Impairment Domain: Esophageal Impairment Domain Esophageal clearance upright position: -- (deferred - pt having esophagram right after MBS) Pill: Pill Consistency administered: -- (deferred - pt having esophagram right after MBS) Penetration/Aspiration Scale Score: Penetration/Aspiration Scale Score 1.  Material does not enter airway: Moderately thick liquids (Level 3, honey thick); Puree; Solid 2.  Material enters airway, remains ABOVE vocal cords then ejected out: Thin liquids (Level 0); Mildly thick liquids (Level 2, nectar thick) Compensatory Strategies: Compensatory Strategies Compensatory strategies: Yes Straw: Effective Effective Straw: Thin liquid (Level 0)   General Information: Caregiver present: No  Diet Prior to this Study: Regular; Thin liquids (Level 0)   Temperature : Normal   Respiratory Status: WFL   Supplemental O2: None (Room air)   History of Recent Intubation: No  Behavior/Cognition: Alert; Cooperative; Pleasant mood Self-Feeding Abilities: Able to self-feed Baseline vocal quality/speech: Normal No data recorded Volitional Swallow: Able to elicit Exam Limitations: No limitations Goal Planning: No data recorded No data recorded No data recorded Patient/Family Stated Goal: none stated  Consulted and agree with results and recommendations: Patient; Family member/caregiver; Physician; Other (comment) (called sister, Pam, per pt request) Pain: Pain Assessment Pain Assessment: Faces Faces Pain Scale: 0 End of Session: Start Time:SLP Start Time (ACUTE ONLY): 1228 Stop Time: SLP Stop Time (ACUTE ONLY): 1242 Time Calculation:SLP Time Calculation (min) (ACUTE ONLY): 14 min Charges: SLP Evaluations $ SLP Speech Visit: 1 Visit SLP Evaluations $BSS Swallow: 1 Procedure $MBS Swallow: 1 Procedure SLP visit diagnosis: SLP Visit Diagnosis: Dysphagia, unspecified (R13.10) Past Medical History: Past Medical History: Diagnosis Date  Alcoholism (HCC)   Anxiety   Arthritis   knees  Asthma   CAD S/P percutaneous coronary angioplasty 01/18/12; 10/2013  a. pRCA 3.5 x 18 vision BMS - 4.2 mm; b. 2/'15: mCx 3.5 x 12  Rebel BMS (3.6-3.7 mm)  Chronic back pain   mid/lower (08/01/2014)  Colon polyps 02/18/2020  May 2021 - 11 polyps that I removed during your recent procedure were proven to be adenomatous. Fortunately all of these polyps were extremely small in size.  These are considered to be pre-cancerous polyps that may have grown into cancers if they had not been removed.  Studies show that at least 20% of women over age 5 and 30% of men over age 57 have pre-cancerous polyps. Based on current [place]  COPD (chronic obstructive pulmonary disease) (HCC)   I'm seeing COPD dr now; don't know if I've got it (08/01/2014)  Depression   Dyspnea   GERD (gastroesophageal reflux disease)   Hyperlipidemia   Hypertension   Migraines   once in awhile (08/01/2014); no current problems as of 09/18/23 per pt.  Non-ST elevation myocardial infarction (NSTEMI) (HCC) 10/2013  OSA on CPAP 01/19/2012  Pneumonia   Sleep apnea   uses CPAP nightly  Type II diabetes mellitus [facility])  Past Surgical History: Past Surgical History: Procedure Laterality Date  CARDIAC CATHETERIZATION  07/2014  Left Main: Short, large-caliber vessel. Widely patent.  Bifurcates into the LAD and Circumflex. Angiographically normal.  CORONARY ANGIOPLASTY WITH STENT PLACEMENT  01/2012; 11/07/2013; 08/01/2014  1 + 2 + 1   CORONARY ANGIOPLASTY WITH STENT PLACEMENT  07/2014  Severe single-vessel disease involving the bifurcation of OM1 and OM 2 with 99% in-stent restenosis in the bare-metal stent placed in the OM 2.  LEFT HEART  CATHETERIZATION WITH CORONARY ANGIOGRAM N/A 01/20/2012  Procedure: LEFT HEART CATHETERIZATION WITH CORONARY ANGIOGRAM;  Surgeon: Candyce GORMAN Reek, MD;  Location: [facility] CATH LAB;  Service: Cardiovascular;  Laterality: N/A;  LEFT HEART CATHETERIZATION WITH CORONARY ANGIOGRAM N/A 11/07/2013  Procedure: LEFT HEART CATHETERIZATION WITH CORONARY ANGIOGRAM;  Surgeon: Candyce GORMAN Reek, MD;  Location: [facility] CATH LAB;  Service: Cardiovascular;  Laterality: N/A;  LEFT HEART CATHETERIZATION WITH CORONARY ANGIOGRAM N/A 08/01/2014  Procedure: LEFT HEART CATHETERIZATION WITH CORONARY ANGIOGRAM;  Surgeon: [name] LELON Clay, MD;  Location[facility] CATH LAB;  Service: Cardiovascular;  Laterality: N/A;  MULTIPLE TOOTH EXTRACTIONS    PERCUTANEOUS CORONARY STENT INTERVENTION (PCI-S)  01/20/2012  Procedure: PERCUTANEOUS CORONARY STENT INTERVENTION (PCI-S);  Surgeon: Candyce GORMAN Reek, MD;  Location: [facility] CATH LAB;  Service: Cardiovascular;;  PERCUTANEOUS CORONARY STENT INTERVENTION (PCI-S)  11/07/2013  Procedure: PERCUTANEOUS CORONARY STENT INTERVENTION (PCI-S);  Surgeon: Candyce GORMAN Reek, MD;  Location: [facility] CATH LAB;  Service: Cardiovascular;; [name]., M.A. CCC-SLP Acute Rehabilitation Services Office [phone] Secure chat preferred 09/22/2023, 2:19 PM  DG Chest Portable 1 View Result Date: 09/20/2023 CLINICAL DATA:  Shortness of breath EXAM: PORTABLE CHEST 1 VIEW COMPARISON:  07/20/2023 FINDINGS: Stable cardiomediastinal silhouette. No focal consolidation, pleural effusion, or pneumothorax. No displaced rib fractures. IMPRESSION: No active disease. Electronically Signed   By: Norman Gatlin M.D.   On: 09/20/2023 00:26    Administration History     None           No data to display          No results found for: NITRICOXIDE      Assessment & Plan:   Reactive airway disease No evidence of formal obstruction on previous PFT.  He has had multiple admissions over the last year for respiratory related illness.  Recent admission it was felt as though his flares have been related to reactive airway disease exacerbated by poorly controlled GERD.  Also potential for aspiration with CPAP.  He is clinically improved with PPI therapy.  Cough has resolved.  Lung exam clear.  Encouraged him to continue current management/precautions.  Continue ICS/LABA therapy.  Action plan in place. Will set him up for formal PFT  Patient Instructions  Continue Albuterol  inhaler 2 puffs every 6 hours as needed for shortness of breath or wheezing. Notify if symptoms persist despite rescue inhaler/neb use.  Continue Dulera  2 puffs Twice daily. Brush tongue and rinse mouth afterwards  Continue omeprazole  20 mg Twice daily for reflux. Take 30 minutes before you eat - sent in a new prescription for you  Continue elevating the head of your bed or use your wedge pillow at night Avoid eating and drinking 2-3 hours before bed. You can drink some water but nothing else  Avoid triggering foods  Referral to GI  We discussed how untreated sleep apnea puts an individual at risk for cardiac arrhthymias, pulm HTN, DM, stroke and increases their risk for daytime accidents.  Continue to use CPAP every night, minimum of 4-6 hours a night.  Change equipment as directed. Wash your tubing with warm soap and water daily, hang to dry. Wash humidifier portion weekly. Use bottled, distilled water and change daily Be aware of reduced alertness and do not drive or operate heavy machinery if experiencing this or drowsiness.  Exercise encouraged, as tolerated. Healthy weight management discussed.  Avoid or  decrease alcohol consumption and medications that make you more sleepy, if possible. Notify if persistent daytime sleepiness occurs even with consistent use of  PAP therapy.  CPAP titration study ordered to check your settings on CPAP and make sure you're well managed  Attend lung function testing on 2/12  Follow up with Dr. Theophilus (1st) or Katie Nagee Goates,NP in 6 weeks. If symptoms do not improve or worsen, please contact office for sooner follow up or seek emergency care.    OSA on CPAP OSA on CPAP. Download unavailable. Concern for aspiration on CPAP; GERD improved with PPI therapy. Unclear if he is well controlled on current settings. Reviewed risks of untreated OSA. CPAP titration study ordered for further evaluation. Safe driving practices reviewed. Healthy weight loss encouraged.   GERD (gastroesophageal reflux disease) See above. Refer to GI. May need EGD if symptoms persist  Former smoker Former smoker. PCP follows with lung cancer screening LDCT chest. Due July 2025.   Advised if symptoms do not improve or worsen, to please contact office for sooner follow up or seek emergency care.   I spent 45 minutes of dedicated to the care of this patient on the date of this encounter to include pre-visit review of records, face-to-face time with the patient discussing conditions above, post visit ordering of testing, clinical documentation with the electronic health record, making appropriate referrals as documented, and communicating necessary findings to members of the patients care team.  Comer LULLA Rouleau, NP 10/19/2023  Pt aware and understands NP's role. '

## 2023-10-17 NOTE — Patient Instructions (Addendum)
 Continue Albuterol  inhaler 2 puffs every 6 hours as needed for shortness of breath or wheezing. Notify if symptoms persist despite rescue inhaler/neb use.  Continue Dulera  2 puffs Twice daily. Brush tongue and rinse mouth afterwards  Continue omeprazole  20 mg Twice daily for reflux. Take 30 minutes before you eat - sent in a new prescription for you  Continue elevating the head of your bed or use your wedge pillow at night Avoid eating and drinking 2-3 hours before bed. You can drink some water but nothing else  Avoid triggering foods  Referral to GI  We discussed how untreated sleep apnea puts an individual at risk for cardiac arrhthymias, pulm HTN, DM, stroke and increases their risk for daytime accidents.  Continue to use CPAP every night, minimum of 4-6 hours a night.  Change equipment as directed. Wash your tubing with warm soap and water daily, hang to dry. Wash humidifier portion weekly. Use bottled, distilled water and change daily Be aware of reduced alertness and do not drive or operate heavy machinery if experiencing this or drowsiness.  Exercise encouraged, as tolerated. Healthy weight management discussed.  Avoid or decrease alcohol consumption and medications that make you more sleepy, if possible. Notify if persistent daytime sleepiness occurs even with consistent use of PAP therapy.  CPAP titration study ordered to check your settings on CPAP and make sure you're well managed  Attend lung function testing on 2/12  Follow up with Dr. Theophilus (1st) or Katie Sandee Bernath,NP in 6 weeks. If symptoms do not improve or worsen, please contact office for sooner follow up or seek emergency care.

## 2023-10-18 ENCOUNTER — Encounter: Payer: Self-pay | Admitting: Gastroenterology

## 2023-10-19 ENCOUNTER — Other Ambulatory Visit: Payer: Self-pay | Admitting: Cardiology

## 2023-10-19 ENCOUNTER — Encounter: Payer: Self-pay | Admitting: Nurse Practitioner

## 2023-10-19 DIAGNOSIS — I2510 Atherosclerotic heart disease of native coronary artery without angina pectoris: Secondary | ICD-10-CM

## 2023-10-19 NOTE — Assessment & Plan Note (Signed)
 Former smoker. PCP follows with lung cancer screening LDCT chest. Due July 2025.

## 2023-10-19 NOTE — Assessment & Plan Note (Signed)
 OSA on CPAP. Download unavailable. Concern for aspiration on CPAP; GERD improved with PPI therapy. Unclear if he is well controlled on current settings. Reviewed risks of untreated OSA. CPAP titration study ordered for further evaluation. Safe driving practices reviewed. Healthy weight loss encouraged.

## 2023-10-19 NOTE — Assessment & Plan Note (Addendum)
 See above. Refer to GI. May need EGD if symptoms persist

## 2023-10-19 NOTE — Assessment & Plan Note (Addendum)
 No evidence of formal obstruction on previous PFT.  He has had multiple admissions over the last year for respiratory related illness.  Recent admission it was felt as though his flares have been related to reactive airway disease exacerbated by poorly controlled GERD.  Also potential for aspiration with CPAP.  He is clinically improved with PPI therapy.  Cough has resolved.  Lung exam clear.  Encouraged him to continue current management/precautions.  Continue ICS/LABA therapy.  Action plan in place. Will set him up for formal PFT  Patient Instructions  Continue Albuterol  inhaler 2 puffs every 6 hours as needed for shortness of breath or wheezing. Notify if symptoms persist despite rescue inhaler/neb use.  Continue Dulera  2 puffs Twice daily. Brush tongue and rinse mouth afterwards  Continue omeprazole  20 mg Twice daily for reflux. Take 30 minutes before you eat - sent in a new prescription for you  Continue elevating the head of your bed or use your wedge pillow at night Avoid eating and drinking 2-3 hours before bed. You can drink some water but nothing else  Avoid triggering foods  Referral to GI  We discussed how untreated sleep apnea puts an individual at risk for cardiac arrhthymias, pulm HTN, DM, stroke and increases their risk for daytime accidents.  Continue to use CPAP every night, minimum of 4-6 hours a night.  Change equipment as directed. Wash your tubing with warm soap and water daily, hang to dry. Wash humidifier portion weekly. Use bottled, distilled water and change daily Be aware of reduced alertness and do not drive or operate heavy machinery if experiencing this or drowsiness.  Exercise encouraged, as tolerated. Healthy weight management discussed.  Avoid or decrease alcohol consumption and medications that make you more sleepy, if possible. Notify if persistent daytime sleepiness occurs even with consistent use of PAP therapy.  CPAP titration study ordered to check your  settings on CPAP and make sure you're well managed  Attend lung function testing on 2/12  Follow up with Dr. Theophilus (1st) or Katie Nastacia Raybuck,NP in 6 weeks. If symptoms do not improve or worsen, please contact office for sooner follow up or seek emergency care.

## 2023-10-23 ENCOUNTER — Telehealth: Payer: Self-pay

## 2023-10-23 NOTE — Telephone Encounter (Signed)
 Pharmacy Patient Advocate Encounter   Received notification from CoverMyMeds that prior authorization for OZEMPIC  2MG  DOSE PENS is required/requested.   Insurance verification completed.   The patient is insured through [facility] MEDICAID .   PA required; PA submitted to above mentioned insurance via CoverMyMeds Key/confirmation #/EOC [place]. Status is pending

## 2023-10-24 NOTE — Telephone Encounter (Signed)
Pharmacy Patient Advocate Encounter  Received notification from [facility] MEDICAID that Prior Authorization for [facility] 2MG  DOSE PENS has been APPROVED from 10/23/23 to 10/22/24   PA #/Case ID/Reference #: [id number]

## 2023-10-25 ENCOUNTER — Ambulatory Visit (INDEPENDENT_AMBULATORY_CARE_PROVIDER_SITE_OTHER): Payer: Medicaid Other | Admitting: Pulmonary Disease

## 2023-10-25 ENCOUNTER — Encounter: Payer: Self-pay | Admitting: Pulmonary Disease

## 2023-10-25 DIAGNOSIS — J449 Chronic obstructive pulmonary disease, unspecified: Secondary | ICD-10-CM | POA: Diagnosis not present

## 2023-10-25 LAB — PULMONARY FUNCTION TEST
DL/VA % pred: 118 %
DL/VA: 5.02 ml/min/mmHg/L
DLCO cor % pred: 106 %
DLCO cor: 26.36 ml/min/mmHg
DLCO unc % pred: 104 %
DLCO unc: 26.06 ml/min/mmHg
FEF 25-75 Post: 0.88 L/s
FEF 25-75 Pre: 0.78 L/s
FEF2575-%Change-Post: 12 %
FEF2575-%Pred-Post: 33 %
FEF2575-%Pred-Pre: 29 %
FEV1-%Change-Post: 4 %
FEV1-%Pred-Post: 49 %
FEV1-%Pred-Pre: 47 %
FEV1-Post: 1.56 L
FEV1-Pre: 1.5 L
FEV1FVC-%Change-Post: 0 %
FEV1FVC-%Pred-Pre: 75 %
FEV6-%Change-Post: 5 %
FEV6-%Pred-Post: 69 %
FEV6-%Pred-Pre: 66 %
FEV6-Post: 2.78 L
FEV6-Pre: 2.64 L
FEV6FVC-%Change-Post: 0 %
FEV6FVC-%Pred-Post: 105 %
FEV6FVC-%Pred-Pre: 105 %
FVC-%Change-Post: 4 %
FVC-%Pred-Post: 65 %
FVC-%Pred-Pre: 62 %
FVC-Post: 2.78 L
FVC-Pre: 2.65 L
Post FEV1/FVC ratio: 56 %
Post FEV6/FVC ratio: 100 %
Pre FEV1/FVC ratio: 57 %
Pre FEV6/FVC Ratio: 100 %
RV % pred: 104 %
RV: 2.21 L
TLC % pred: 76 %
TLC: 4.89 L

## 2023-10-25 NOTE — Patient Instructions (Signed)
Full PFT performed today.

## 2023-10-25 NOTE — Progress Notes (Signed)
Full PFT performed today.

## 2023-10-26 ENCOUNTER — Other Ambulatory Visit: Payer: Self-pay | Admitting: Family Medicine

## 2023-10-27 NOTE — Telephone Encounter (Signed)
Attempted to call patient about tramadol--appears to get very infrequently. If he calls back please ask how often he uses it.  Attempted to call patient. Reached voicemail, left generic voicemail to call back.   Terisa Starr, MD  Family Medicine Teaching Service

## 2023-10-31 NOTE — Telephone Encounter (Signed)
Patient returns call to nurse line, LVM regarding prescription refill being denied.   Attempted to call patient back to gather more information.   Patient did not answer, LVM asking him to return call to office.   Veronda Prude, RN

## 2023-11-04 NOTE — Progress Notes (Unsigned)
 11/10/2023 [name] [name]   September 09, 1961  [id number]  Primary Physician [name] Chandler, MD Primary Cardiologist: [name]  HPI:  [name] [name] is seen today for follow up CAD. His past medical history is significant for CAD, DM, obesity, tobacco use , hyperlipidemia and hypertension. He  is status post stenting of the right coronary artery in May of 2013. He was admitted with a NSTEMI in February of 2015. He was found to have severe disease in LCx and OM2 treated with BMS.  He was placed on dual antiplatelet therapy with aspirin plus Effient.   He presented with increased angina in October 2015. A Myoview study was intermediate risk.  He had repeat cardiac cath in November 2015.  He was found to have severe single-vessel disease involving the bifurcation of OM1 and OM 2 with 99% in-stent restenosis in the bare-metal stent placed in the OM 2. He underwent successful PCI spanning the proximal circumflex stent across OM 1 through the bare-metal stent placed in OM 2 with a Xience alpine DES stent. He was also noted to have a widely patent stent in the RCA and proximal circumflex with otherwise mild-moderate disease. He has preserved LVEF with an ejection fraction of 55-65%. He was instructed to continue with DAPT with ASA + Effient.   He was admitted in September 2022 with acute respiratory failure and hypoxia. Patient intially was hypoxic to 80s and required bipap shortly after admission. Admission labs were notable for BNP of 145 and troponin levels that trended flat (8, 12 and 12). He had a normal CXR and RVP on admission.  Patient had no signs of a pulmonary embolism with lack of tachycardia hemoptysis and a well score 0.  CHF unlikely because patient was not fluid overloaded on exam.  Patient was given IV steroids, duo nebs, was placed on azithromycin. Echocardiogram was completed and showed left ventricular ejection fraction 60 to 65% with normal left ventricular function and mild left ventricular  hypertrophy.  Presence of grade 1 diastolic dysfunction. CTA showed no evidence of pulmonary embolism but did show evidence of consolidation/collapse of lateral right middle lobe. He was treated with antibiotics and steroids with improvement. Felt to be primarily a COPD exacerbation.   Patient reports he was hospitalized in [place] in June with respiratory distress/failure. He was intubated and on ventilator for the first day. Treated for bronchitis/COPD. States he got it from riding [employer] bus to see his mother.   He was recently admitted in early August with acute COPD exacerbation - treated with steroids and bronchodilators.   He was admitted in Jan with acute COPD exacerbation and GERD. Now on prilosec and Dulera. Followed by pulmonary    Current Outpatient Medications  Medication Sig Dispense Refill   aspirin 81 MG chewable tablet Chew 1 tablet (81 mg total) by mouth daily. 180 tablet 2   atorvastatin (LIPITOR) 80 MG tablet TAKE 1 TABLET BY MOUTH DAILY (Patient taking differently: Take 80 mg by mouth at bedtime.) 90 tablet 3   carvedilol (COREG) 3.125 MG tablet TAKE 1 TABLET BY MOUTH TWICE A DAY WITH A MEAL 180 tablet 3   citalopram (CELEXA) 20 MG tablet Take 1 tablet (20 mg total) by mouth daily. 90 tablet 1   Continuous Glucose Sensor (FREESTYLE LIBRE 2 SENSOR) MISC APPLY SENSOR TO SKIN FOR CONTINUOUS BLOOD SUGAR MONITORING, REPLACE EVERY 14 DAYS 2 each 6   DROPLET PEN NEEDLES 29G X MISC USE ONCE DAILY WITH VICTOZA PEN 100 each 1  gentamicin cream (GARAMYCIN) 0.1 % Apply to right foot once daily. (Patient taking differently: Apply 1 Application topically daily as needed (irritation). Apply to right foot once daily.) 30 g 0   glucose blood (ACCU-CHEK GUIDE) test strip Use as instructed 100 each 12   insulin glargine (LANTUS SOLOSTAR) 100 UNIT/ML Solostar Pen Inject 14-16 Units into the skin daily.     Insulin Pen Needle (PEN NEEDLES) 32G X 4 MM MISC Use as directed with insulin four  times daily 100 each 12   JARDIANCE 25 MG TABS tablet TAKE 1 TABLET BY MOUTH DAILY 90 tablet 3   lisinopril (ZESTRIL) 2.5 MG tablet TAKE 1 TABLET BY MOUTH TWICE A DAY 180 tablet 3   Melatonin 2.5 MG CAPS Take 5 mg by mouth at bedtime.     metFORMIN (GLUCOPHAGE) 1000 MG tablet TAKE ONE TABLET BY MOUTH TWICE A DAY WITH MEALS 180 tablet 3   mometasone-formoterol (DULERA) 200-5 MCG/ACT AERO Inhale 2 puffs into the lungs in the morning and at bedtime. 39 g 0   Multiple Vitamin (MULTIVITAMIN) tablet Take 1 tablet by mouth daily.     nitroGLYCERIN (NITROSTAT) 0.4 MG SL tablet DISSOLVE 1 TABLET UNDER THE TONGUE FOR CHEST PAIN. IF PAIN REMAINS AFTER 5 MINUTES, CALL 911 AND REPEAT DOSE. MAX 3 TABLETS IN 15 MINUTES 100 tablet 0   NON FORMULARY Pt uses a c-pap nightly     Omega-3 Fatty Acids (FISH OIL) 1000 MG CAPS Take 1 capsule (1,000 mg total) by mouth daily.     omeprazole (PRILOSEC) 20 MG capsule Take 1 capsule (20 mg total) by mouth 2 (two) times daily before a meal. 60 capsule 5   prasugrel (EFFIENT) 10 MG TABS tablet Take 1 tablet (10 mg total) by mouth daily. 90 tablet 2   Semaglutide, 2 MG/DOSE, (OZEMPIC, 2 MG/DOSE,) 8 MG/3ML SOPN Inject 2 mg into the skin once a week. Monday 9 mL 2   VENTOLIN HFA 108 (90 Base) MCG/ACT inhaler Inhale 2 puffs into the lungs every 6 (six) hours as needed.     No current facility-administered medications for this visit.    Allergies  Allergen Reactions   Shellfish Allergy Nausea And Vomiting    OYSTERS    Social History   Socioeconomic History   Marital status: Single    Spouse name: Not on file   Number of children: 0   Years of education: 15   Highest education level: Not on file  Occupational History   Occupation: Unemployed    Comment: Used to work for [employer]   Occupation: Consulting civil engineer, Insurance underwriter [employer]  Tobacco Use   Smoking status: Former    Current packs/day: 0.00    Average packs/day: 1 pack/day for 42.4 years (42.4 ttl pk-yrs)     Types: Cigarettes    Start date: 09/13/1975    Quit date: 01/25/2018    Years since quitting: 5.7   Smokeless tobacco: Former    Types: Chew   Tobacco comments:    Bupropion helping  Vaping Use   Vaping status: Never Used  Substance and Sexual Activity   Alcohol use: Not Currently    Alcohol/week: 6.0 standard drinks of alcohol    Types: 6 Cans of beer per week    Comment: quit 3 yrs ago in 12/2023 per pt.   Drug use: No    Comment: hx Crack Cocaine ( 05/09/16)   Sexual activity: Not Currently  Other Topics Concern   Not on file  Social History Narrative   Single.  Lives with a roommate.  Ambulates independently.   Social Drivers of Corporate investment banker Strain: Not on file  Food Insecurity: No Food Insecurity (09/20/2023)   Hunger Vital Sign    Worried About Running Out of Food in the Last Year: Never true    Ran Out of Food in the Last Year: Never true  Transportation Needs: No Transportation Needs (09/20/2023)   PRAPARE - Administrator, Civil Service (Medical): No    Lack of Transportation (Non-Medical): No  Physical Activity: Not on file  Stress: Not on file  Social Connections: Not on file  Intimate Partner Violence: Not At Risk (09/20/2023)   Humiliation, Afraid, Rape, and Kick questionnaire    Fear of Current or Ex-Partner: No    Emotionally Abused: No    Physically Abused: No    Sexually Abused: No     Review of Systems: AS noted in HPI All other systems reviewed and are otherwise negative except as noted above.  Blood pressure 126/66, pulse 80, height 5\' 7"  (1.702 m), weight 272 lb (123.4 kg).  GENERAL:  Well appearing obese WM in NAD HEENT:  PERRL, EOMI, sclera are clear. Oropharynx is clear. NECK:  No jugular venous distention, carotid upstroke brisk and symmetric, no bruits, no thyromegaly or adenopathy LUNGS:  Clear to auscultation bilaterally CHEST:  Unremarkable HEART:  RRR,  PMI not displaced or sustained,S1 and S2 within normal limits,  no S3, no S4: no clicks, no rubs, no murmurs ABD:  Soft, nontender. BS +, no masses or bruits. No hepatomegaly, no splenomegaly EXT:  2 + pulses throughout, no edema, no cyanosis no clubbing SKIN:  Warm and dry.  No rashes NEURO:  Alert and oriented x 3. Cranial nerves II through XII intact. PSYCH:  Cognitively intact      Laboratory data:  Lab Results  Component Value Date   WBC 11.4 (H) 09/22/2023   HGB 14.2 09/22/2023   HCT 42.3 09/22/2023   PLT 229 09/22/2023   GLUCOSE 200 (H) 09/22/2023   CHOL 89 (L) 10/25/2022   TRIG 76 10/25/2022   HDL 41 10/25/2022   LDLDIRECT 70 09/02/2020   LDLCALC 32 10/25/2022   ALT 17 04/12/2023   AST 18 04/12/2023   NA 136 09/22/2023   K 3.6 09/22/2023   CL 101 09/22/2023   CREATININE 0.64 09/22/2023   BUN 13 09/22/2023   CO2 25 09/22/2023   TSH 1.100 01/18/2012   INR 0.96 07/28/2014   HGBA1C 7.9 (A) 07/19/2023   MICROALBUR 4.7 05/23/2016   Ecg today shows NSR rate 76. LAD, Normal. I have personally reviewed and interpreted this study.   Echo 05/10/21: IMPRESSIONS     1. Left ventricular ejection fraction, by estimation, is 60 to 65%. The  left ventricle has normal function. The left ventricle has no regional  wall motion abnormalities. There is mild left ventricular hypertrophy.  Left ventricular diastolic parameters  are consistent with Grade I diastolic dysfunction (impaired relaxation).   2. Right ventricular systolic function is normal. The right ventricular  size is normal. Tricuspid regurgitation signal is inadequate for assessing  PA pressure.   3. The mitral valve is normal in structure. No evidence of mitral valve  regurgitation. No evidence of mitral stenosis.   4. The aortic valve is tricuspid. Aortic valve regurgitation is not  visualized. Mild aortic valve sclerosis is present, with no evidence of  aortic valve stenosis.   5.  The inferior vena cava is normal in size with <50% respiratory  variability, suggesting  right atrial pressure of 8 mmHg.    ASSESSMENT AND PLAN:   1. CAD: Status post repeat PCI to the proximal circumflex and OM 2 in Nov. 2015  with DES for instent restenosis. No anginal symptoms.  Continue dual antiplatelet therapy with aspirin plus Effient indefinitely due to extensive nature of stents.  Continue coreg,  statin and ACE inhibitor for secondary prevention.  2. Hypertension: Blood pressure is well-controlled. Continue lisinopril and Coreg  3. Hyperlipidemia: on statin. Last LDL at goal  4. Diabetes: per primary care. Encourage weight loss and continued physical exercise.   5. Tobacco abuse: patient has quit. Congratulated.   6. COPD. Follow up with pulmonary  I will follow up in 6 months   Diarra Ceja [place] MD,FACC  11/10/2023 2:43 PM

## 2023-11-10 ENCOUNTER — Other Ambulatory Visit: Payer: Self-pay | Admitting: Family Medicine

## 2023-11-10 ENCOUNTER — Encounter: Payer: Self-pay | Admitting: Cardiology

## 2023-11-10 ENCOUNTER — Ambulatory Visit: Payer: Medicaid Other | Attending: Cardiology | Admitting: Cardiology

## 2023-11-10 VITALS — BP 126/66 | HR 80 | Ht 67.0 in | Wt 272.0 lb

## 2023-11-10 DIAGNOSIS — E1165 Type 2 diabetes mellitus with hyperglycemia: Secondary | ICD-10-CM

## 2023-11-10 DIAGNOSIS — E1110 Type 2 diabetes mellitus with ketoacidosis without coma: Secondary | ICD-10-CM

## 2023-11-10 DIAGNOSIS — I10 Essential (primary) hypertension: Secondary | ICD-10-CM | POA: Diagnosis not present

## 2023-11-10 DIAGNOSIS — E782 Mixed hyperlipidemia: Secondary | ICD-10-CM | POA: Diagnosis not present

## 2023-11-10 DIAGNOSIS — I2510 Atherosclerotic heart disease of native coronary artery without angina pectoris: Secondary | ICD-10-CM

## 2023-11-10 DIAGNOSIS — Z794 Long term (current) use of insulin: Secondary | ICD-10-CM

## 2023-11-10 NOTE — Pre-Procedure Instructions (Addendum)
 Surgical Instructions   Your procedure is scheduled on Thursday, March 6th. Report to [facility] Main Entrance "A" at 10:15 A.M., then check in with the Admitting office. Any questions or running late day of surgery: call [phone]  Questions prior to your surgery date: call [phone], Monday-Friday, 8am-4pm. If you experience any cold or flu symptoms such as cough, fever, chills, shortness of breath, etc. between now and your scheduled surgery, please notify us at the above number.     Remember:  Do not eat after midnight the night before your surgery   You may drink clear liquids until 09:15 AM the morning of your surgery.   Clear liquids allowed are: Water, Non-Citrus Juices (without pulp), Carbonated Beverages, Clear Tea (no milk, honey, etc.), Black Coffee Only (NO MILK, CREAM OR POWDERED CREAMER of any kind), and Gatorade.    Take these medicines the morning of surgery with A SIP OF WATER  carvedilol (COREG)  citalopram (CELEXA)  mometasone-formoterol (DULERA)  omeprazole (PRILOSEC)    May take these medicines IF NEEDED: nitroGLYCERIN (NITROSTAT)- If you have to take this medication prior to surgery, please call [phone] and report this to a nurse  albuterol (VENTOLIN HFA)- bring inhaler with you   Follow your surgeon's instructions on when to stop Aspirin and prasugrel (EFFIENT).  If no instructions were given by your surgeon then you will need to call the office to get those instructions.     One week prior to surgery, STOP taking any Aleve, Naproxen, Ibuprofen, Motrin, Advil, Goody's, BC's, all herbal medications, fish oil, and non-prescription vitamins.  WHAT DO I DO ABOUT MY DIABETES MEDICATION?   Do not take metFORMIN (GLUCOPHAGE) the morning of surgery.  THE NIGHT BEFORE SURGERY, take 7-8 units (50%) of  insulin glargine (LANTUS SOLOSTAR).    OR  THE MORNING OF SURGERY, take 7-8 units (50%) of  insulin glargine (LANTUS SOLOSTAR).   Stop taking  JARDIANCE 3 days prior to surgery. Last dose 3/2.  Stop taking Semaglutide (OZEMPIC) for 7 days prior to surgery. Last dose 2/24.    HOW TO MANAGE YOUR DIABETES BEFORE AND AFTER SURGERY  Why is it important to control my blood sugar before and after surgery? Improving blood sugar levels before and after surgery helps healing and can limit problems. A way of improving blood sugar control is eating a healthy diet by:  Eating less sugar and carbohydrates  Increasing activity/exercise  Talking with your doctor about reaching your blood sugar goals High blood sugars (greater than 180 mg/dL) can raise your risk of infections and slow your recovery, so you will need to focus on controlling your diabetes during the weeks before surgery. Make sure that the doctor who takes care of your diabetes knows about your planned surgery including the date and location.  How do I manage my blood sugar before surgery? Check your blood sugar at least 4 times a day, starting 2 days before surgery, to make sure that the level is not too high or low.  Check your blood sugar the morning of your surgery when you wake up and every 2 hours until you get to the Short Stay unit.  If your blood sugar is less than 70 mg/dL, you will need to treat for low blood sugar: Do not take insulin. Treat a low blood sugar (less than 70 mg/dL) with  cup of clear juice (cranberry or apple), 4 glucose tablets, OR glucose gel. Recheck blood sugar in 15 minutes after treatment (to make sure  it is greater than 70 mg/dL). If your blood sugar is not greater than 70 mg/dL on recheck, call [phone] for further instructions. Report your blood sugar to the short stay nurse when you get to Short Stay.  If you are admitted to the hospital after surgery: Your blood sugar will be checked by the staff and you will probably be given insulin after surgery (instead of oral diabetes medicines) to make sure you have good blood sugar levels. The  goal for blood sugar control after surgery is 80-180 mg/dL.                     Do NOT Smoke (Tobacco/Vaping) for 24 hours prior to your procedure.  If you use a CPAP at night, you may bring your mask/headgear for your overnight stay.   You will be asked to remove any contacts, glasses, piercing's, hearing aid's, dentures/partials prior to surgery. Please bring cases for these items if needed.    Patients discharged the day of surgery will not be allowed to drive home, and someone needs to stay with them for 24 hours.  SURGICAL WAITING ROOM VISITATION Patients may have no more than 2 support people in the waiting area - these visitors may rotate.   Pre-op nurse will coordinate an appropriate time for 1 ADULT support person, who may not rotate, to accompany patient in pre-op.  Children under the age of 66 must have an adult with them who is not the patient and must remain in the main waiting area with an adult.  If the patient needs to stay at the hospital during part of their recovery, the visitor guidelines for inpatient rooms apply.  Please refer to the [facility] website for the visitor guidelines for any additional information.   If you received a COVID test during your pre-op visit  it is requested that you wear a mask when out in public, stay away from anyone that may not be feeling well and notify your surgeon if you develop symptoms. If you have been in contact with anyone that has tested positive in the last 10 days please notify you surgeon.      Pre-operative CHG Bathing Instructions   You can play a key role in reducing the risk of infection after surgery. Your skin needs to be as free of germs as possible. You can reduce the number of germs on your skin by washing with CHG (chlorhexidine gluconate) soap before surgery. CHG is an antiseptic soap that kills germs and continues to kill germs even after washing.   DO NOT use if you have an allergy to chlorhexidine/CHG or  antibacterial soaps. If your skin becomes reddened or irritated, stop using the CHG and notify one of our RNs at [phone].              TAKE A SHOWER THE NIGHT BEFORE SURGERY AND THE DAY OF SURGERY    Please keep in mind the following:  DO NOT shave, including legs and underarms, 48 hours prior to surgery.   You may shave your face before/day of surgery.  Place clean sheets on your bed the night before surgery Use a clean washcloth (not used since being washed) for each shower. DO NOT sleep with pet's night before surgery.  CHG Shower Instructions:  Wash your face and private area with normal soap. If you choose to wash your hair, wash first with your normal shampoo.  After you use shampoo/soap, rinse your hair and body  thoroughly to remove shampoo/soap residue.  Turn the water OFF and apply half the bottle of CHG soap to a CLEAN washcloth.  Apply CHG soap ONLY FROM YOUR NECK DOWN TO YOUR TOES (washing for 3-5 minutes)  DO NOT use CHG soap on face, private areas, open wounds, or sores.  Pay special attention to the area where your surgery is being performed.  If you are having back surgery, having someone wash your back for you may be helpful. Wait 2 minutes after CHG soap is applied, then you may rinse off the CHG soap.  Pat dry with a clean towel  Put on clean pajamas    Additional instructions for the day of surgery: DO NOT APPLY any lotions, deodorants, cologne, or perfumes.   Do not wear jewelry or makeup Do not wear nail polish, gel polish, artificial nails, or any other type of covering on natural nails (fingers and toes) Do not bring valuables to the hospital. [facility] is not responsible for valuables/personal belongings. Put on clean/comfortable clothes.  Please brush your teeth.  Ask your nurse before applying any prescription medications to the skin.

## 2023-11-10 NOTE — Patient Instructions (Signed)
 Medication Instructions:  Continue same medications *If you need a refill on your cardiac medications before your next appointment, please call your pharmacy*   Lab Work: None ordered   Testing/Procedures: None ordered   Follow-Up: At [facility], you and your health needs are our priority.  As part of our continuing mission to provide you with exceptional heart care, we have created designated Provider Care Teams.  These Care Teams include your primary Cardiologist (physician) and Advanced Practice Providers (APPs -  Physician Assistants and Nurse Practitioners) who all work together to provide you with the care you need, when you need it.  We recommend signing up for the patient portal called "MyChart".  Sign up information is provided on this After Visit Summary.  MyChart is used to connect with patients for Virtual Visits (Telemedicine).  Patients are able to view lab/test results, encounter notes, upcoming appointments, etc.  Non-urgent messages can be sent to your provider as well.   To learn more about what you can do with MyChart, go to [email or web address].    Your next appointment:  6 months   Call in April to schedule August appointment     Provider:  Dr.Jordan

## 2023-11-13 ENCOUNTER — Encounter (HOSPITAL_COMMUNITY)
Admission: RE | Admit: 2023-11-13 | Discharge: 2023-11-13 | Disposition: A | Discharge status: Home / Self Care | Payer: Medicaid Other | Source: Ambulatory Visit | Attending: Otolaryngology | Admitting: Otolaryngology

## 2023-11-13 ENCOUNTER — Other Ambulatory Visit: Payer: Self-pay

## 2023-11-13 ENCOUNTER — Encounter (HOSPITAL_COMMUNITY): Payer: Self-pay

## 2023-11-13 ENCOUNTER — Other Ambulatory Visit: Payer: Self-pay | Admitting: Otolaryngology

## 2023-11-13 VITALS — BP 106/55 | HR 76 | Resp 19 | Ht 67.0 in | Wt 272.9 lb

## 2023-11-13 DIAGNOSIS — I252 Old myocardial infarction: Secondary | ICD-10-CM | POA: Diagnosis not present

## 2023-11-13 DIAGNOSIS — Z01812 Encounter for preprocedural laboratory examination: Secondary | ICD-10-CM | POA: Diagnosis present

## 2023-11-13 DIAGNOSIS — E785 Hyperlipidemia, unspecified: Secondary | ICD-10-CM | POA: Diagnosis not present

## 2023-11-13 DIAGNOSIS — G4733 Obstructive sleep apnea (adult) (pediatric): Secondary | ICD-10-CM | POA: Diagnosis not present

## 2023-11-13 DIAGNOSIS — Z794 Long term (current) use of insulin: Secondary | ICD-10-CM | POA: Diagnosis not present

## 2023-11-13 DIAGNOSIS — I2510 Atherosclerotic heart disease of native coronary artery without angina pectoris: Secondary | ICD-10-CM | POA: Diagnosis not present

## 2023-11-13 DIAGNOSIS — E119 Type 2 diabetes mellitus without complications: Secondary | ICD-10-CM | POA: Diagnosis not present

## 2023-11-13 DIAGNOSIS — Z01818 Encounter for other preprocedural examination: Secondary | ICD-10-CM

## 2023-11-13 DIAGNOSIS — Z87891 Personal history of nicotine dependence: Secondary | ICD-10-CM | POA: Insufficient documentation

## 2023-11-13 DIAGNOSIS — Z955 Presence of coronary angioplasty implant and graft: Secondary | ICD-10-CM | POA: Insufficient documentation

## 2023-11-13 DIAGNOSIS — I10 Essential (primary) hypertension: Secondary | ICD-10-CM | POA: Insufficient documentation

## 2023-11-13 DIAGNOSIS — J4489 Other specified chronic obstructive pulmonary disease: Secondary | ICD-10-CM | POA: Insufficient documentation

## 2023-11-13 LAB — BASIC METABOLIC PANEL
Anion gap: 16 — ABNORMAL HIGH (ref 5–15)
BUN: 15 mg/dL (ref 8–23)
CO2: 22 mmol/L (ref 22–32)
Calcium: 9.3 mg/dL (ref 8.9–10.3)
Chloride: 102 mmol/L (ref 98–111)
Creatinine, Ser: 0.66 mg/dL (ref 0.61–1.24)
GFR, Estimated: >60 mL/min (ref >60)
Glucose, Bld: 197 mg/dL — ABNORMAL HIGH (ref 70–99)
Potassium: 4.3 mmol/L (ref 3.5–5.1)
Sodium: 140 mmol/L (ref 135–145)

## 2023-11-13 LAB — CBC
HCT: 44.4 % (ref 39.0–52.0)
Hemoglobin: 14.8 g/dL (ref 13.0–17.0)
MCH: 29.4 pg (ref 26.0–34.0)
MCHC: 33.3 g/dL (ref 30.0–36.0)
MCV: 88.3 fL (ref 80.0–100.0)
Platelets: 288 10*3/uL (ref 150–400)
RBC: 5.03 MIL/uL (ref 4.22–5.81)
RDW: 14.1 % (ref 11.5–15.5)
WBC: 10.2 10*3/uL (ref 4.0–10.5)
nRBC: 0 % (ref 0.0–0.2)

## 2023-11-13 LAB — HEMOGLOBIN A1C
Hgb A1c MFr Bld: 8.6 % — ABNORMAL HIGH (ref 4.8–5.6)
Mean Plasma Glucose: 200.12 mg/dL

## 2023-11-13 LAB — GLUCOSE, CAPILLARY: Glucose-Capillary: 190 mg/dL — ABNORMAL HIGH (ref 70–99)

## 2023-11-13 NOTE — Progress Notes (Addendum)
 PCP - Dr. Terisa Starr Cardiologist - Dr. Peter Swaziland  PPM/ICD - denies   Chest x-ray - 09/20/23 EKG - 09/19/23 Stress Test - 07/21/14 ECHO - 05/10/21 Cardiac Cath - 08/01/14  Sleep Study - OSA+ CPAP - nightly, pressure settings 10  Fasting Blood Sugar - 90-150 Checks Blood Sugar 1-2 times a day  Last dose of GLP1 agonist-  11/07/23 GLP1 instructions:  Pt knows not to take any further doses prior to surgery  Pt states he was not told to hold Jardiance for 3 days. Took last dose 3/3, and knows not to take any further doses   ASA/Blood Thinner Instructions: Pt stopped taking ASA and Effient on 2/28 and knows not to take any further doses   ERAS Protcol - clears until 0915   COVID TEST- n/a   Anesthesia review: yes, cardiac hx  Patient denies shortness of breath, fever, cough and chest pain at PAT appointment   All instructions explained to the patient, with a verbal understanding of the material. Patient agrees to go over the instructions while at home for a better understanding.  The opportunity to ask questions was provided.

## 2023-11-14 ENCOUNTER — Other Ambulatory Visit: Payer: Self-pay | Admitting: Family Medicine

## 2023-11-14 DIAGNOSIS — E1165 Type 2 diabetes mellitus with hyperglycemia: Secondary | ICD-10-CM

## 2023-11-14 DIAGNOSIS — Z794 Long term (current) use of insulin: Secondary | ICD-10-CM

## 2023-11-14 NOTE — Anesthesia Preprocedure Evaluation (Addendum)
 Anesthesia Evaluation  Patient identified by MRN, date of birth, ID band Patient awake    Reviewed: Allergy & Precautions, NPO status , Patient's Chart, lab work & pertinent test results  History of Anesthesia Complications Negative for: history of anesthetic complications  Airway Mallampati: III  TM Distance: >3 FB Neck ROM: Full    Dental  (+) Dental Advisory Given   Pulmonary asthma , sleep apnea , COPD (exacerbation in January, took [facility] this morning, reports breathing is good),  COPD inhaler, neg recent URI, former smoker   Pulmonary exam normal breath sounds clear to auscultation       Cardiovascular hypertension (carvedilol, lisinopril), Pt. on home beta blockers (-) angina + CAD, + Past MI (2015) and + Cardiac Stents (2013, 2015)  (-) CABG (-) dysrhythmias  Rhythm:Regular Rate:Normal  HLD  TTE 05/10/2021: IMPRESSIONS    1. Left ventricular ejection fraction, by estimation, is 60 to 65%. The  left ventricle has normal function. The left ventricle has no regional  wall motion abnormalities. There is mild left ventricular hypertrophy.  Left ventricular diastolic parameters  are consistent with Grade I diastolic dysfunction (impaired relaxation).   2. Right ventricular systolic function is normal. The right ventricular  size is normal. Tricuspid regurgitation signal is inadequate for assessing  PA pressure.   3. The mitral valve is normal in structure. No evidence of mitral valve  regurgitation. No evidence of mitral stenosis.   4. The aortic valve is tricuspid. Aortic valve regurgitation is not  visualized. Mild aortic valve sclerosis is present, with no evidence of  aortic valve stenosis.   5. The inferior vena cava is normal in size with <50% respiratory  variability, suggesting right atrial pressure of 8 mmHg.     Neuro/Psych  Headaches, neg Seizures PSYCHIATRIC DISORDERS Anxiety Depression        GI/Hepatic ,GERD  Medicated,,(+)     substance abuse  alcohol use  Endo/Other  diabetes (Hgb A1c 8.6), Poorly Controlled, Type 2, Insulin Dependent, Oral Hypoglycemic Agents    Renal/GU negative Renal ROS     Musculoskeletal  (+) Arthritis ,    Abdominal  (+) + obese  Peds  Hematology negative hematology ROS (+) Lab Results      Component                Value               Date                      WBC                      10.2                11/13/2023                HGB                      14.8                11/13/2023                HCT                      44.4                11/13/2023  MCV                      88.3                11/13/2023                PLT                      288                 11/13/2023              Anesthesia Other Findings Last Jardiance: 11/13/2023  Last Effient: 11/13/2023  Last Ozempic: >1 week ago  Reproductive/Obstetrics                              Anesthesia Physical Anesthesia Plan  ASA: 3  Anesthesia Plan: General   Post-op Pain Management: Tylenol PO (pre-op)*   Induction: Intravenous  PONV Risk Score and Plan: 2 and Ondansetron, Dexamethasone and Treatment may vary due to age or medical condition  Airway Management Planned: Oral ETT  Additional Equipment:   Intra-op Plan:   Post-operative Plan: Extubation in OR  Informed Consent: I have reviewed the patients History and Physical, chart, labs and discussed the procedure including the risks, benefits and alternatives for the proposed anesthesia with the patient or authorized representative who has indicated his/her understanding and acceptance.     Dental advisory given  Plan Discussed with: Anesthesiologist and CRNA  Anesthesia Plan Comments: (Risks of general anesthesia discussed including, but not limited to, sore throat, hoarse voice, chipped/damaged teeth, injury to vocal cords, nausea and vomiting, allergic reactions,  lung infection, heart attack, stroke, and death. All questions answered.   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  Most recent echo 05/10/2021 showed EF 60 to 65%, grade 1 DD, no significant valvular abnormalities.  Last seen by [name] 11/02/2023.  Stable at that time.  Per note, "Status post repeat PCI to the proximal circumflex and OM 2 in Nov. 2015  with DES for instent restenosis. No anginal symptoms.  Continue dual antiplatelet therapy with aspirin plus Effient indefinitely due to extensive nature of stents.  Continue coreg,  statin and ACE inhibitor for secondary prevention."  Follows with pulmonology for history of OSA on CPAP, GERD, and COPD/asthma with frequent admissions for exacerbations with respiratory failure, most recently in January 2025 (he did not require intubation on this admission, but has on multiple prior admissions).  He is a former smoker.  Pulmonologist Dr. Levon Hedger did comment on upcoming surgery in telephone counter 09/22/2023 stating, "Patient has been seen and evaluated in hospital. Should be okay for proposed procedure by [facility]. No further pulmonary testing needing prior to planned procedure." Seen in follow-up by Augustine Radar, NP on 10/17/2023.  Per note, "No evidence of formal obstruction on previous PFT.  He has had multiple admissions over the last year for respiratory related illness.  Recent admission it was felt as though his flares have been related to reactive airway disease exacerbated by poorly controlled GERD.  Also potential for aspiration with CPAP.  He is clinically improved  with PPI therapy.  Cough has resolved.  Lung exam clear.  Encouraged him to continue current management/precautions.  Continue ICS/LABA therapy.  Action  plan in place. Will set him up for formal PFT."  He was continued on omeprazole 20 mg twice daily, Dulera 2 puffs twice daily, as needed rescue inhaler/nebulizer.  IDDM2, A1c 8.6 on preop labs.  Remainder of preop labs unremarkable.  EKG 09/19/2023: Sinus rhythm.  Rate 85.  PVC.  Aberrant conduction of SV complexes.  Left anterior fascicular block.  PFTs 10/25/2023: FVC-%Pred-Pre % 62 FEV1-%Pred-Pre % 47 FEV1FVC-%Pred-Pre % 75 TLC % pred % 76 RV % pred % 104 DLCO unc % pred % 104   TTE 05/10/2021: 1. Left ventricular ejection fraction, by estimation, is 60 to 65%. The  left ventricle has normal function. The left ventricle has no regional  wall motion abnormalities. There is mild left ventricular hypertrophy.  Left ventricular diastolic parameters  are consistent with Grade I diastolic dysfunction (impaired relaxation).   2. Right ventricular systolic function is normal. The right ventricular  size is normal. Tricuspid regurgitation signal is inadequate for assessing  PA pressure.   3. The mitral valve is normal in structure. No evidence of mitral valve  regurgitation. No evidence of mitral stenosis.   4. The aortic valve is tricuspid. Aortic valve regurgitation is not  visualized. Mild aortic valve sclerosis is present, with no evidence of  aortic valve stenosis.   5. The inferior vena cava is normal in size with <50% respiratory  variability, suggesting right atrial pressure of 8 mmHg.   Cath/PCI 08/01/2014: POST-OPERATIVE DIAGNOSIS:    Severe single-vessel disease involving the bifurcation of OM1 and OM 2 with 99% in-stent restenosis in the bare-metal stent placed in the OM 2.  Successful PCI spanning the proximal circumflex stent across OM 1 through the bare-metal stent placed in OM 2 with a Xience alpine DES stent  Widely patent stent in the RCA and proximal circumflex with otherwise mild-moderate disease.  Preserved LVEF with mildly elevated LVEDP  )          Anesthesia Quick Evaluation

## 2023-11-14 NOTE — Telephone Encounter (Signed)
Called patient, no answer, will try again later. Penni Bombard CMA

## 2023-11-14 NOTE — Telephone Encounter (Signed)
 Red team--unclear dosing--can you please call him and ask how much he is injecting?  Thanks Terisa Starr, MD  Family Medicine Teaching Service

## 2023-11-14 NOTE — Progress Notes (Addendum)
 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  Most recent echo 05/10/2021 showed EF 60 to 65%, grade 1 DD, no significant valvular abnormalities.  Last seen by [name] 11/02/2023.  Stable at that time.  Per note, "Status post repeat PCI to the proximal circumflex and OM 2 in Nov. 2015  with DES for instent restenosis. No anginal symptoms.  Continue dual antiplatelet therapy with aspirin plus Effient indefinitely due to extensive nature of stents.  Continue coreg,  statin and ACE inhibitor for secondary prevention."  Follows with pulmonology for history of OSA on CPAP, GERD, and COPD/asthma with frequent admissions for exacerbations with respiratory failure, most recently in January 2025 (he did not require intubation on this admission, but has on multiple prior admissions).  He is a former smoker.  Pulmonologist Dr. Levon Hedger did comment on upcoming surgery in telephone counter 09/22/2023 stating, "Patient has been seen and evaluated in hospital. Should be okay for proposed procedure by [facility]. No further pulmonary testing needing prior to planned procedure." Seen in follow-up by Augustine Radar, NP on 10/17/2023.  Per note, "No evidence of formal obstruction on previous PFT.  He has had multiple admissions over the last year for respiratory related illness.  Recent admission it was felt as though his flares have been related to reactive airway disease exacerbated by poorly controlled GERD.  Also potential for aspiration with CPAP.  He is clinically improved with PPI therapy.  Cough has resolved.  Lung exam clear.  Encouraged him to continue current management/precautions.  Continue ICS/LABA therapy.  Action plan in place. Will set him up for formal PFT."  He was continued on omeprazole 20 mg  twice daily, Dulera 2 puffs twice daily, as needed rescue inhaler/nebulizer.  IDDM2, A1c 8.6 on preop labs.  Remainder of preop labs unremarkable.  EKG 09/19/2023: Sinus rhythm.  Rate 85.  PVC.  Aberrant conduction of SV complexes.  Left anterior fascicular block.  PFTs 10/25/2023: FVC-%Pred-Pre % 62  FEV1-%Pred-Pre % 47  FEV1FVC-%Pred-Pre % 75  TLC % pred % 76  RV % pred % 104  DLCO unc % pred % 104    TTE 05/10/2021: 1. Left ventricular ejection fraction, by estimation, is 60 to 65%. The  left ventricle has normal function. The left ventricle has no regional  wall motion abnormalities. There is mild left ventricular hypertrophy.  Left ventricular diastolic parameters  are consistent with Grade I diastolic dysfunction (impaired relaxation).   2. Right ventricular systolic function is normal. The right ventricular  size is normal. Tricuspid regurgitation signal is inadequate for assessing  PA pressure.   3. The mitral valve is normal in structure. No evidence of mitral valve  regurgitation. No evidence of mitral stenosis.   4. The aortic valve is tricuspid. Aortic valve regurgitation is not  visualized. Mild aortic valve sclerosis is present, with no evidence of  aortic valve stenosis.   5. The inferior vena cava is normal in size with <50% respiratory  variability, suggesting right atrial pressure of 8 mmHg.   Cath/PCI 08/01/2014: POST-OPERATIVE DIAGNOSIS:   Severe single-vessel disease involving the bifurcation of OM1 and OM 2 with 99% in-stent restenosis in the bare-metal stent placed in the OM 2. Successful PCI spanning the proximal circumflex stent across OM  1 through the bare-metal stent placed in OM 2 with a Xience alpine DES stent Widely patent stent in the RCA and proximal circumflex with otherwise mild-moderate disease. Preserved LVEF with mildly elevated LVEDP   Zannie Cove [facility] Short Stay Center/Anesthesiology Phone [phone] 11/14/2023 1:53 PM

## 2023-11-15 MED ORDER — TRAMADOL HCL 50 MG PO TABS: 50.0000 mg | ORAL_TABLET | Freq: Two times a day (BID) | ORAL | 0 refills | Status: AC | PRN

## 2023-11-15 NOTE — Telephone Encounter (Signed)
 Both sent to pharmacy.  Terisa Starr, MD  Family Medicine Teaching Service

## 2023-11-16 ENCOUNTER — Encounter (HOSPITAL_COMMUNITY): Payer: Self-pay | Admitting: Otolaryngology

## 2023-11-16 ENCOUNTER — Observation Stay (HOSPITAL_COMMUNITY): Payer: Self-pay | Admitting: Physician Assistant

## 2023-11-16 ENCOUNTER — Other Ambulatory Visit: Payer: Self-pay

## 2023-11-16 ENCOUNTER — Encounter (HOSPITAL_COMMUNITY)
Admission: RE | Disposition: A | Discharge status: Home / Self Care | Payer: Self-pay | Source: Home / Self Care | Attending: Otolaryngology

## 2023-11-16 ENCOUNTER — Observation Stay (HOSPITAL_BASED_OUTPATIENT_CLINIC_OR_DEPARTMENT_OTHER): Payer: Self-pay | Admitting: Anesthesiology

## 2023-11-16 ENCOUNTER — Observation Stay (HOSPITAL_COMMUNITY)
Admission: RE | Admit: 2023-11-16 | Discharge: 2023-11-17 | Disposition: A | Discharge status: Home / Self Care | Payer: Medicaid Other | Attending: Otolaryngology | Admitting: Otolaryngology

## 2023-11-16 DIAGNOSIS — K219 Gastro-esophageal reflux disease without esophagitis: Secondary | ICD-10-CM | POA: Diagnosis not present

## 2023-11-16 DIAGNOSIS — Z794 Long term (current) use of insulin: Secondary | ICD-10-CM | POA: Diagnosis not present

## 2023-11-16 DIAGNOSIS — H7013 Chronic mastoiditis, bilateral: Secondary | ICD-10-CM | POA: Diagnosis not present

## 2023-11-16 DIAGNOSIS — Z7982 Long term (current) use of aspirin: Secondary | ICD-10-CM | POA: Diagnosis not present

## 2023-11-16 DIAGNOSIS — H7012 Chronic mastoiditis, left ear: Principal | ICD-10-CM | POA: Insufficient documentation

## 2023-11-16 DIAGNOSIS — E119 Type 2 diabetes mellitus without complications: Secondary | ICD-10-CM | POA: Diagnosis not present

## 2023-11-16 DIAGNOSIS — Z955 Presence of coronary angioplasty implant and graft: Secondary | ICD-10-CM | POA: Insufficient documentation

## 2023-11-16 DIAGNOSIS — G4733 Obstructive sleep apnea (adult) (pediatric): Secondary | ICD-10-CM | POA: Insufficient documentation

## 2023-11-16 DIAGNOSIS — Z23 Encounter for immunization: Secondary | ICD-10-CM | POA: Insufficient documentation

## 2023-11-16 DIAGNOSIS — J45909 Unspecified asthma, uncomplicated: Secondary | ICD-10-CM | POA: Insufficient documentation

## 2023-11-16 DIAGNOSIS — E785 Hyperlipidemia, unspecified: Secondary | ICD-10-CM | POA: Insufficient documentation

## 2023-11-16 DIAGNOSIS — J449 Chronic obstructive pulmonary disease, unspecified: Secondary | ICD-10-CM | POA: Diagnosis not present

## 2023-11-16 DIAGNOSIS — Z7984 Long term (current) use of oral hypoglycemic drugs: Secondary | ICD-10-CM | POA: Diagnosis not present

## 2023-11-16 DIAGNOSIS — H9213 Otorrhea, bilateral: Secondary | ICD-10-CM | POA: Diagnosis not present

## 2023-11-16 DIAGNOSIS — Z79899 Other long term (current) drug therapy: Secondary | ICD-10-CM | POA: Diagnosis not present

## 2023-11-16 DIAGNOSIS — Z87891 Personal history of nicotine dependence: Secondary | ICD-10-CM | POA: Insufficient documentation

## 2023-11-16 DIAGNOSIS — I2510 Atherosclerotic heart disease of native coronary artery without angina pectoris: Secondary | ICD-10-CM | POA: Insufficient documentation

## 2023-11-16 DIAGNOSIS — Z7985 Long-term (current) use of injectable non-insulin antidiabetic drugs: Secondary | ICD-10-CM | POA: Diagnosis not present

## 2023-11-16 DIAGNOSIS — H7010 Chronic mastoiditis, unspecified ear: Principal | ICD-10-CM | POA: Diagnosis present

## 2023-11-16 HISTORY — PX: TYMPANOMASTOIDECTOMY: SHX34

## 2023-11-16 LAB — GLUCOSE, CAPILLARY
Glucose-Capillary: 183 mg/dL — ABNORMAL HIGH (ref 70–99)
Glucose-Capillary: 145 mg/dL — ABNORMAL HIGH (ref 70–99)
Glucose-Capillary: 147 mg/dL — ABNORMAL HIGH (ref 70–99)
Glucose-Capillary: 131 mg/dL — ABNORMAL HIGH (ref 70–99)
Glucose-Capillary: 145 mg/dL — ABNORMAL HIGH (ref 70–99)

## 2023-11-16 SURGERY — TYMPANOPLASTY, WITH MASTOIDECTOMY
Anesthesia: General | Site: Ear | Laterality: Left

## 2023-11-16 MED ORDER — SUGAMMADEX SODIUM 200 MG/2ML IV SOLN
INTRAVENOUS | Status: DC | PRN
  Administered 2023-11-16: 600 mg via INTRAVENOUS

## 2023-11-16 MED ORDER — KETAMINE HCL 50 MG/5ML IJ SOSY
PREFILLED_SYRINGE | INTRAMUSCULAR | Status: AC
  Filled 2023-11-16: qty 5

## 2023-11-16 MED ORDER — EMPAGLIFLOZIN 25 MG PO TABS
25.0000 mg | ORAL_TABLET | Freq: Every day | ORAL | Status: DC
  Administered 2023-11-16 – 2023-11-17 (×2): 25 mg via ORAL
  Filled 2023-11-16 (×2): qty 1

## 2023-11-16 MED ORDER — PRASUGREL HCL 10 MG PO TABS
10.0000 mg | ORAL_TABLET | Freq: Every day | ORAL | Status: DC
  Administered 2023-11-16 – 2023-11-17 (×2): 10 mg via ORAL
  Filled 2023-11-16 (×2): qty 1

## 2023-11-16 MED ORDER — PROPOFOL 500 MG/50ML IV EMUL
INTRAVENOUS | Status: DC | PRN
  Administered 2023-11-16: 85 ug/kg/min via INTRAVENOUS

## 2023-11-16 MED ORDER — CIPROFLOXACIN-DEXAMETHASONE 0.3-0.1 % OT SUSP
OTIC | Status: DC | PRN
  Administered 2023-11-16: 4 [drp] via OTIC

## 2023-11-16 MED ORDER — EPHEDRINE SULFATE-NACL 50-0.9 MG/10ML-% IV SOSY
PREFILLED_SYRINGE | INTRAVENOUS | Status: DC | PRN
  Administered 2023-11-16 (×5): 10 mg via INTRAVENOUS

## 2023-11-16 MED ORDER — HYDROCODONE-ACETAMINOPHEN 5-325 MG PO TABS
1.0000 | ORAL_TABLET | ORAL | Status: DC | PRN
  Administered 2023-11-17: 1 via ORAL
  Filled 2023-11-16: qty 1

## 2023-11-16 MED ORDER — LIDOCAINE 2% (20 MG/ML) 5 ML SYRINGE
INTRAMUSCULAR | Status: AC
  Filled 2023-11-16: qty 5

## 2023-11-16 MED ORDER — PROPOFOL 10 MG/ML IV BOLUS
INTRAVENOUS | Status: AC
  Filled 2023-11-16: qty 20

## 2023-11-16 MED ORDER — LIDOCAINE 2% (20 MG/ML) 5 ML SYRINGE
INTRAMUSCULAR | Status: DC | PRN
  Administered 2023-11-16: 100 mg via INTRAVENOUS

## 2023-11-16 MED ORDER — METHYLENE BLUE (ANTIDOTE) 1 % IV SOLN
INTRAVENOUS | Status: AC
  Filled 2023-11-16: qty 10

## 2023-11-16 MED ORDER — LISINOPRIL 2.5 MG PO TABS
2.5000 mg | ORAL_TABLET | Freq: Two times a day (BID) | ORAL | Status: DC
  Administered 2023-11-16 – 2023-11-17 (×2): 2.5 mg via ORAL
  Filled 2023-11-16 (×3): qty 1

## 2023-11-16 MED ORDER — PHENYLEPHRINE HCL-NACL 20-0.9 MG/250ML-% IV SOLN
INTRAVENOUS | Status: DC | PRN
  Administered 2023-11-16: 30 ug/min via INTRAVENOUS

## 2023-11-16 MED ORDER — CEFAZOLIN SODIUM-DEXTROSE 3-4 GM/150ML-% IV SOLN
3.0000 g | INTRAVENOUS | Status: AC
  Administered 2023-11-16: 3 g via INTRAVENOUS
  Filled 2023-11-16: qty 150

## 2023-11-16 MED ORDER — KETAMINE HCL 10 MG/ML IJ SOLN
INTRAMUSCULAR | Status: DC | PRN
  Administered 2023-11-16: 10 mg via INTRAVENOUS
  Administered 2023-11-16: 20 mg via INTRAVENOUS
  Administered 2023-11-16: 10 mg via INTRAVENOUS

## 2023-11-16 MED ORDER — GLYCOPYRROLATE 0.2 MG/ML IJ SOLN
INTRAMUSCULAR | Status: DC | PRN
Start: 2023-11-16 — End: 2023-11-16
  Administered 2023-11-16 (×2): .2 mg via INTRAVENOUS

## 2023-11-16 MED ORDER — ASPIRIN 81 MG PO CHEW
81.0000 mg | CHEWABLE_TABLET | Freq: Every day | ORAL | Status: DC
  Administered 2023-11-17: 81 mg via ORAL
  Filled 2023-11-16: qty 1

## 2023-11-16 MED ORDER — LACTATED RINGERS IV SOLN: INTRAVENOUS | Status: DC

## 2023-11-16 MED ORDER — ATORVASTATIN CALCIUM 80 MG PO TABS
80.0000 mg | ORAL_TABLET | Freq: Every day | ORAL | Status: DC
  Administered 2023-11-16 – 2023-11-17 (×2): 80 mg via ORAL
  Filled 2023-11-16 (×2): qty 1

## 2023-11-16 MED ORDER — AMISULPRIDE (ANTIEMETIC) 5 MG/2ML IV SOLN: 10.0000 mg | Freq: Once | INTRAVENOUS | Status: DC | PRN

## 2023-11-16 MED ORDER — LIDOCAINE-EPINEPHRINE 1 %-1:100000 IJ SOLN
INTRAMUSCULAR | Status: DC | PRN
  Administered 2023-11-16: 7 mL

## 2023-11-16 MED ORDER — SODIUM CHLORIDE 0.9 % IR SOLN
Status: DC | PRN
  Administered 2023-11-16: 1

## 2023-11-16 MED ORDER — INSULIN GLARGINE 100 UNIT/ML ~~LOC~~ SOLN
14.0000 [IU] | Freq: Every day | SUBCUTANEOUS | Status: DC
  Administered 2023-11-17: 14 [IU] via SUBCUTANEOUS
  Filled 2023-11-16: qty 0.14

## 2023-11-16 MED ORDER — POTASSIUM CHLORIDE IN NACL 20-0.45 MEQ/L-% IV SOLN
INTRAVENOUS | Status: DC
  Filled 2023-11-16: qty 1000

## 2023-11-16 MED ORDER — CHLORHEXIDINE GLUCONATE 0.12 % MT SOLN
15.0000 mL | Freq: Once | OROMUCOSAL | Status: AC
  Administered 2023-11-16: 15 mL via OROMUCOSAL
  Filled 2023-11-16: qty 15

## 2023-11-16 MED ORDER — ALBUTEROL SULFATE HFA 108 (90 BASE) MCG/ACT IN AERS: 1.0000 | INHALATION_SPRAY | Freq: Four times a day (QID) | RESPIRATORY_TRACT | Status: DC | PRN

## 2023-11-16 MED ORDER — TRAMADOL HCL 50 MG PO TABS: 50.0000 mg | ORAL_TABLET | Freq: Two times a day (BID) | ORAL | Status: DC | PRN

## 2023-11-16 MED ORDER — 0.9 % SODIUM CHLORIDE (POUR BTL) OPTIME
TOPICAL | Status: DC | PRN
  Administered 2023-11-16: 1000 mL

## 2023-11-16 MED ORDER — EPINEPHRINE HCL (NASAL) 0.1 % NA SOLN
NASAL | Status: AC
  Filled 2023-11-16: qty 30

## 2023-11-16 MED ORDER — CIPROFLOXACIN-DEXAMETHASONE 0.3-0.1 % OT SUSP
OTIC | Status: AC
  Filled 2023-11-16: qty 7.5

## 2023-11-16 MED ORDER — PANTOPRAZOLE SODIUM 40 MG PO TBEC
40.0000 mg | DELAYED_RELEASE_TABLET | Freq: Every day | ORAL | Status: DC
  Administered 2023-11-17: 40 mg via ORAL
  Filled 2023-11-16: qty 1

## 2023-11-16 MED ORDER — CITALOPRAM HYDROBROMIDE 20 MG PO TABS
20.0000 mg | ORAL_TABLET | Freq: Every day | ORAL | Status: DC
Start: 2023-11-17 — End: 2023-11-17
  Administered 2023-11-17: 20 mg via ORAL
  Filled 2023-11-16: qty 1

## 2023-11-16 MED ORDER — BACITRACIN ZINC 500 UNIT/GM EX OINT
TOPICAL_OINTMENT | CUTANEOUS | Status: DC | PRN
  Administered 2023-11-16: 1 via TOPICAL

## 2023-11-16 MED ORDER — ACETAMINOPHEN 500 MG PO TABS
1000.0000 mg | ORAL_TABLET | Freq: Once | ORAL | Status: AC
  Administered 2023-11-16: 1000 mg via ORAL
  Filled 2023-11-16: qty 2

## 2023-11-16 MED ORDER — BACITRACIN ZINC 500 UNIT/GM EX OINT
TOPICAL_OINTMENT | CUTANEOUS | Status: AC
  Filled 2023-11-16: qty 28.35

## 2023-11-16 MED ORDER — FENTANYL CITRATE (PF) 250 MCG/5ML IJ SOLN
INTRAMUSCULAR | Status: DC | PRN
  Administered 2023-11-16 (×2): 50 ug via INTRAVENOUS

## 2023-11-16 MED ORDER — FENTANYL CITRATE (PF) 250 MCG/5ML IJ SOLN
INTRAMUSCULAR | Status: AC
  Filled 2023-11-16: qty 5

## 2023-11-16 MED ORDER — MOMETASONE FURO-FORMOTEROL FUM 200-5 MCG/ACT IN AERO
2.0000 | INHALATION_SPRAY | Freq: Two times a day (BID) | RESPIRATORY_TRACT | Status: DC
  Administered 2023-11-16: 2 via RESPIRATORY_TRACT
  Filled 2023-11-16: qty 8.8

## 2023-11-16 MED ORDER — INSULIN GLARGINE-YFGN 100 UNIT/ML ~~LOC~~ SOLN: 14.0000 [IU] | Freq: Every day | SUBCUTANEOUS | Status: DC

## 2023-11-16 MED ORDER — LIDOCAINE-EPINEPHRINE 1 %-1:100000 IJ SOLN
INTRAMUSCULAR | Status: AC
Start: 2023-11-16 — End: ?
  Filled 2023-11-16: qty 1

## 2023-11-16 MED ORDER — MIDAZOLAM HCL 2 MG/2ML IJ SOLN
INTRAMUSCULAR | Status: AC
  Filled 2023-11-16: qty 2

## 2023-11-16 MED ORDER — ALBUTEROL SULFATE HFA 108 (90 BASE) MCG/ACT IN AERS
INHALATION_SPRAY | RESPIRATORY_TRACT | Status: DC | PRN
  Administered 2023-11-16: 3 via RESPIRATORY_TRACT
  Administered 2023-11-16: 8 via RESPIRATORY_TRACT

## 2023-11-16 MED ORDER — OXYCODONE HCL 5 MG/5ML PO SOLN: 5.0000 mg | Freq: Once | ORAL | Status: DC | PRN

## 2023-11-16 MED ORDER — ONDANSETRON HCL 4 MG/2ML IJ SOLN
INTRAMUSCULAR | Status: DC | PRN
Start: 2023-11-16 — End: 2023-11-16
  Administered 2023-11-16: 4 mg via INTRAVENOUS

## 2023-11-16 MED ORDER — OXYCODONE HCL 5 MG PO TABS: 5.0000 mg | ORAL_TABLET | Freq: Once | ORAL | Status: DC | PRN

## 2023-11-16 MED ORDER — PNEUMOCOCCAL 20-VAL CONJ VACC 0.5 ML IM SUSY
0.5000 mL | PREFILLED_SYRINGE | INTRAMUSCULAR | Status: AC
  Administered 2023-11-17: 0.5 mL via INTRAMUSCULAR
  Filled 2023-11-16 (×2): qty 0.5

## 2023-11-16 MED ORDER — MIDAZOLAM HCL 5 MG/5ML IJ SOLN
INTRAMUSCULAR | Status: DC | PRN
  Administered 2023-11-16: 2 mg via INTRAVENOUS

## 2023-11-16 MED ORDER — MELATONIN 5 MG PO TABS
5.0000 mg | ORAL_TABLET | Freq: Every day | ORAL | Status: DC
  Administered 2023-11-16: 5 mg via ORAL
  Filled 2023-11-16: qty 1

## 2023-11-16 MED ORDER — CARVEDILOL 3.125 MG PO TABS
3.1250 mg | ORAL_TABLET | Freq: Two times a day (BID) | ORAL | Status: DC
  Administered 2023-11-16 – 2023-11-17 (×2): 3.125 mg via ORAL
  Filled 2023-11-16 (×2): qty 1

## 2023-11-16 MED ORDER — PROPOFOL 10 MG/ML IV BOLUS
INTRAVENOUS | Status: DC | PRN
  Administered 2023-11-16: 200 mg via INTRAVENOUS

## 2023-11-16 MED ORDER — ALBUTEROL SULFATE (2.5 MG/3ML) 0.083% IN NEBU: 2.5000 mg | INHALATION_SOLUTION | Freq: Four times a day (QID) | RESPIRATORY_TRACT | Status: DC | PRN

## 2023-11-16 MED ORDER — METFORMIN HCL 500 MG PO TABS
1000.0000 mg | ORAL_TABLET | Freq: Two times a day (BID) | ORAL | Status: DC
  Administered 2023-11-17: 1000 mg via ORAL
  Filled 2023-11-16: qty 2

## 2023-11-16 MED ORDER — PHENYLEPHRINE 80 MCG/ML (10ML) SYRINGE FOR IV PUSH (FOR BLOOD PRESSURE SUPPORT)
PREFILLED_SYRINGE | INTRAVENOUS | Status: DC | PRN
Start: 2023-11-16 — End: 2023-11-16
  Administered 2023-11-16 (×3): 160 ug via INTRAVENOUS

## 2023-11-16 MED ORDER — INSULIN ASPART 100 UNIT/ML IJ SOLN
INTRAMUSCULAR | Status: DC | PRN
  Administered 2023-11-16: 2 [IU] via SUBCUTANEOUS

## 2023-11-16 MED ORDER — INSULIN ASPART 100 UNIT/ML IJ SOLN
0.0000 [IU] | INTRAMUSCULAR | Status: AC | PRN
  Administered 2023-11-16: 2 [IU] via SUBCUTANEOUS
  Administered 2023-11-16: 4 [IU] via SUBCUTANEOUS
  Filled 2023-11-16 (×2): qty 1

## 2023-11-16 MED ORDER — ROCURONIUM BROMIDE 10 MG/ML (PF) SYRINGE
PREFILLED_SYRINGE | INTRAVENOUS | Status: DC | PRN
  Administered 2023-11-16: 70 mg via INTRAVENOUS

## 2023-11-16 MED ORDER — FENTANYL CITRATE (PF) 100 MCG/2ML IJ SOLN: 25.0000 ug | INTRAMUSCULAR | Status: DC | PRN

## 2023-11-16 SURGICAL SUPPLY — 67 items
BENZOIN TINCTURE PRP APPL 2/3 (GAUZE/BANDAGES/DRESSINGS) ×1 IMPLANT
BLADE CLIPPER SURG (BLADE) ×1 IMPLANT
BLADE EAR TYMPAN 2.5 60D BEAV (BLADE) ×1 IMPLANT
BLADE EYE SICKLE 84 5 BEAV (BLADE) ×1 IMPLANT
BLADE SURG 11 STRL SS (BLADE) IMPLANT
BNDG GAUZE DERMACEA FLUFF 4 (GAUZE/BANDAGES/DRESSINGS) ×2 IMPLANT
BNDG STRETCH 4X75 STRL LF (GAUZE/BANDAGES/DRESSINGS) IMPLANT
BUR DIAMOND COARSE 2.0 (BURR) ×1 IMPLANT
BUR RND DIAMOND ELITE 3.5 (BURR) ×1 IMPLANT
BUR ROUND FLUTED 5 RND (BURR) ×1 IMPLANT
BUR SABER RD CUTTING 3.0 (BURR) ×1 IMPLANT
CANISTER SUCT 3000ML PPV (MISCELLANEOUS) ×1 IMPLANT
CORD BIPOLAR FORCEPS 12FT (ELECTRODE) IMPLANT
COTTONBALL LRG STERILE PKG (GAUZE/BANDAGES/DRESSINGS) ×1 IMPLANT
COVER SURGICAL LIGHT HANDLE (MISCELLANEOUS) ×1 IMPLANT
DRAIN PENROSE 12X.25 LTX STRL (MISCELLANEOUS) ×1 IMPLANT
DRAPE EENT ADH APERT 15X15 STR (DRAPES) ×1 IMPLANT
DRAPE MICROSCOPE LEICA 54X105 (DRAPES) ×1 IMPLANT
DRAPE SURG 17X23 STRL (DRAPES) ×1 IMPLANT
DRESSING NASAL KENNEDY 3.5X.9 (MISCELLANEOUS) IMPLANT
DRSG GLASSCOCK MASTOID ADT (GAUZE/BANDAGES/DRESSINGS) IMPLANT
DRSG NASAL KENNEDY 3.5X.9 (MISCELLANEOUS) ×1 IMPLANT
DRSG TELFA 3X8 NADH STRL (GAUZE/BANDAGES/DRESSINGS) ×1 IMPLANT
ELECT COATED BLADE 2.86 ST (ELECTRODE) ×1 IMPLANT
ELECT PAIRED SUBDERMAL (MISCELLANEOUS) ×1 IMPLANT
ELECT REM PT RETURN 9FT ADLT (ELECTROSURGICAL) ×1 IMPLANT
ELECTRODE PAIRED SUBDERMAL (MISCELLANEOUS) ×1 IMPLANT
ELECTRODE REM PT RTRN 9FT ADLT (ELECTROSURGICAL) ×1 IMPLANT
GAUZE 4X4 16PLY ~~LOC~~+RFID DBL (SPONGE) IMPLANT
GAUZE PACKING FOLDED 1/2 STR (GAUZE/BANDAGES/DRESSINGS) IMPLANT
GAUZE SPONGE 4X4 12PLY STRL (GAUZE/BANDAGES/DRESSINGS) ×1 IMPLANT
GLOVE BIO SURGEON STRL SZ7.5 (GLOVE) ×1 IMPLANT
GOWN STRL REUS W/ TWL LRG LVL3 (GOWN DISPOSABLE) ×2 IMPLANT
HEMOSTAT SURGICEL .5X2 ABSORB (HEMOSTASIS) IMPLANT
KIT BASIN OR (CUSTOM PROCEDURE TRAY) ×1 IMPLANT
KIT TURNOVER KIT B (KITS) ×1 IMPLANT
NDL HYPO 25GX1X1/2 BEV (NEEDLE) ×1 IMPLANT
NDL PRECISIONGLIDE 27X1.5 (NEEDLE) ×1 IMPLANT
NEEDLE HYPO 25GX1X1/2 BEV (NEEDLE) ×1 IMPLANT
NEEDLE PRECISIONGLIDE 27X1.5 (NEEDLE) IMPLANT
NS IRRIG 1000ML POUR BTL (IV SOLUTION) ×1 IMPLANT
PAD ARMBOARD 7.5X6 YLW CONV (MISCELLANEOUS) ×2 IMPLANT
PENCIL SMOKE EVACUATOR (MISCELLANEOUS) ×1 IMPLANT
POSITIONER HEAD DONUT 9IN (MISCELLANEOUS) IMPLANT
PROBE NERVBE PRASS .33 (MISCELLANEOUS) ×1 IMPLANT
PUNCH BIOPSY 4MM (MISCELLANEOUS) ×1 IMPLANT
PUNCH BIOPSY DERMAL 6MM STRL (MISCELLANEOUS) ×1 IMPLANT
PUNCH BIOPSY DISP 4 (MISCELLANEOUS) ×1 IMPLANT
SHEET SILICONE 2X3 0.04 REINF (MISCELLANEOUS) IMPLANT
SLEEVE IRRIGATION ELITE 7 (MISCELLANEOUS) ×1 IMPLANT
SPECIMEN JAR SMALL (MISCELLANEOUS) IMPLANT
SPONGE SURGIFOAM ABS GEL 100 (HEMOSTASIS) IMPLANT
SPONGE SURGIFOAM ABS GEL 12-7 (HEMOSTASIS) ×1 IMPLANT
STAPLER VISISTAT 35W (STAPLE) IMPLANT
STRIP CLOSURE SKIN 1/2X4 (GAUZE/BANDAGES/DRESSINGS) ×1 IMPLANT
SUT PLAIN 6 0 TG1408 (SUTURE) ×1 IMPLANT
SUT VIC AB 3-0 SH 27X BRD (SUTURE) ×2 IMPLANT
SUT VIC AB 4-0 PS2 27 (SUTURE) IMPLANT
SUT VICRYL 4-0 PS2 18IN ABS (SUTURE) IMPLANT
SYR BULB EAR ULCER 3OZ GRN STR (SYRINGE) ×1 IMPLANT
TAPE UMBILICAL 1/8X30 (MISCELLANEOUS) ×1 IMPLANT
TOWEL GREEN STERILE FF (TOWEL DISPOSABLE) ×1 IMPLANT
TRAY ENT MC OR (CUSTOM PROCEDURE TRAY) ×1 IMPLANT
TUBING EXTENTION W/L.L. (IV SETS) ×1 IMPLANT
TUBING IRRIGATION (MISCELLANEOUS) ×1 IMPLANT
WATER STERILE IRR 1000ML POUR (IV SOLUTION) ×1 IMPLANT
WIPE INSTRUMENT VISIWIPE 73X73 (MISCELLANEOUS) ×1 IMPLANT

## 2023-11-16 NOTE — Plan of Care (Signed)

## 2023-11-16 NOTE — Plan of Care (Signed)
 Patient arrived from PACU. Dressing in place to left ear, no drainage at this time. RPIV. No complaints of pain. Safety precautions maintained.    Problem: Education: Goal: Knowledge of General Education information will improve Description: Including pain rating scale, medication(s)/side effects and non-pharmacologic comfort measures Outcome: Progressing   Problem: Clinical Measurements: Goal: Ability to maintain clinical measurements within normal limits will improve Outcome: Progressing   Problem: Activity: Goal: Risk for activity intolerance will decrease Outcome: Progressing

## 2023-11-16 NOTE — H&P (Signed)
 [name] [name] is an 63 y.o. male.   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  He presents for left ear surgery.  Past Medical History:  Diagnosis Date   Alcoholism (HCC)    Anxiety    Arthritis    "knees"   Asthma    CAD S/P percutaneous coronary angioplasty 01/18/12; 10/2013   a. pRCA 3.5 x 18 vision BMS - 4.2 mm; b. 2/'15: mCx 3.5 x 12  Rebel BMS (3.6-3.7 mm)   Chronic back pain    "mid/lower" (08/01/2014)   Colon polyps 02/18/2020   May 2021 - 11 polyps that I removed during your recent procedure were proven to be adenomatous. Fortunately all of these polyps were extremely small in size.  These are considered to be pre-cancerous polyps that may have grown into cancers if they had not been removed.  Studies show that at least 20% of women over age 18 and 30% of men over age 56 have pre-cancerous polyps. Based on current [place]   COPD (chronic obstructive pulmonary disease) (HCC)    "I'm seeing COPD dr now; don't know if I've got it" (08/01/2014)   Depression    Dyspnea    GERD (gastroesophageal reflux disease)    Hyperlipidemia    Hypertension    Migraines    "once in awhile" (08/01/2014); no current problems as of 09/18/23 per pt.   Non-ST elevation myocardial infarction (NSTEMI) (HCC) 10/2013   OSA on CPAP 01/19/2012   Pneumonia    Sleep apnea    uses CPAP nightly   Type II diabetes mellitus [facility])     Past Surgical History:  Procedure Laterality Date   CARDIAC CATHETERIZATION  07/2014   Left Main: Short, large-caliber vessel. Widely patent. Bifurcates into the LAD and Circumflex. Angiographically normal.   CORONARY ANGIOPLASTY WITH STENT PLACEMENT  01/2012; 11/07/2013; 08/01/2014   "1 + 2 + 1"    CORONARY ANGIOPLASTY WITH STENT PLACEMENT  07/2014   Severe single-vessel disease involving the bifurcation of OM1 and OM 2 with 99% in-stent restenosis in the bare-metal stent placed in the OM  2.   LEFT HEART CATHETERIZATION WITH CORONARY ANGIOGRAM N/A 01/20/2012   Procedure: LEFT HEART CATHETERIZATION WITH CORONARY ANGIOGRAM;  Surgeon: Corky Crafts, MD;  Location: [facility] CATH LAB;  Service: Cardiovascular;  Laterality: N/A;   LEFT HEART CATHETERIZATION WITH CORONARY ANGIOGRAM N/A 11/07/2013   Procedure: LEFT HEART CATHETERIZATION WITH CORONARY ANGIOGRAM;  Surgeon: Corky Crafts, MD;  Location: [facility] CATH LAB;  Service: Cardiovascular;  Laterality: N/A;   LEFT HEART CATHETERIZATION WITH CORONARY ANGIOGRAM N/A 08/01/2014   Procedure: LEFT HEART CATHETERIZATION WITH CORONARY ANGIOGRAM;  Surgeon: Marykay Lex, MD;  Location: [facility] CATH LAB;  Service: Cardiovascular;  Laterality: N/A;   MULTIPLE TOOTH EXTRACTIONS     PERCUTANEOUS CORONARY STENT INTERVENTION (PCI-S)  01/20/2012   Procedure: PERCUTANEOUS CORONARY STENT INTERVENTION (PCI-S);  Surgeon: Corky Crafts, MD;  Location: [facility] CATH LAB;  Service: Cardiovascular;;   PERCUTANEOUS CORONARY STENT INTERVENTION (PCI-S)  11/07/2013   Procedure: PERCUTANEOUS CORONARY STENT INTERVENTION (PCI-S);  Surgeon: Corky Crafts, MD;  Location: [facility] CATH LAB;  Service: Cardiovascular;;    Family History  Problem Relation Age of Onset   Breast cancer Mother    Heart attack Father 34   Hypertension Sister    Hyperlipidemia Sister    Hyperlipidemia Sister    Hypertension Sister    Diabetes Sister  Heart attack Brother    Stroke Brother    Diabetes Brother    Colon cancer Neg Hx    Esophageal cancer Neg Hx    Rectal cancer Neg Hx    Stomach cancer Neg Hx    Social History:  reports that he quit smoking about 5 years ago. His smoking use included cigarettes. He started smoking about 48 years ago. He has a 42.4 pack-year smoking history. He has never used smokeless tobacco. He reports that he does not currently use alcohol. He reports that he does not use drugs.  Allergies:  Allergies  Allergen Reactions   Shellfish Allergy Nausea  And Vomiting    OYSTERS   Yellow Dye Other (See Comments)    Gi intolerance     Medications Prior to Admission  Medication Sig Dispense Refill   albuterol (VENTOLIN HFA) 108 (90 Base) MCG/ACT inhaler Inhale 1 puff into the lungs every 6 (six) hours as needed for wheezing or shortness of breath.     aspirin 81 MG chewable tablet Chew 1 tablet (81 mg total) by mouth daily. 180 tablet 2   atorvastatin (LIPITOR) 80 MG tablet TAKE 1 TABLET BY MOUTH DAILY (Patient taking differently: Take 80 mg by mouth at bedtime.) 90 tablet 3   carvedilol (COREG) 3.125 MG tablet TAKE 1 TABLET BY MOUTH TWICE A DAY WITH A MEAL 180 tablet 3   citalopram (CELEXA) 20 MG tablet Take 1 tablet (20 mg total) by mouth daily. 90 tablet 1   insulin glargine (LANTUS SOLOSTAR) 100 UNIT/ML Solostar Pen Inject 14 Units into the skin daily. 15 mL 3   JARDIANCE 25 MG TABS tablet TAKE 1 TABLET BY MOUTH DAILY 90 tablet 3   lisinopril (ZESTRIL) 2.5 MG tablet TAKE 1 TABLET BY MOUTH TWICE A DAY 180 tablet 3   Melatonin 2.5 MG CAPS Take 5 mg by mouth at bedtime.     metFORMIN (GLUCOPHAGE) 1000 MG tablet TAKE ONE TABLET BY MOUTH TWICE A DAY WITH MEALS 180 tablet 3   mometasone-formoterol (DULERA) 200-5 MCG/ACT AERO Inhale 2 puffs into the lungs in the morning and at bedtime. (Patient taking differently: Inhale 2-3 puffs into the lungs in the morning and at bedtime.) 39 g 0   Multiple Vitamin (MULTIVITAMIN) tablet Take 1 tablet by mouth daily.     Omega-3 Fatty Acids (FISH OIL) 1000 MG CAPS Take 1 capsule (1,000 mg total) by mouth daily.     omeprazole (PRILOSEC) 20 MG capsule Take 1 capsule (20 mg total) by mouth 2 (two) times daily before a meal. 60 capsule 5   prasugrel (EFFIENT) 10 MG TABS tablet Take 1 tablet (10 mg total) by mouth daily. 90 tablet 2   Semaglutide, 2 MG/DOSE, (OZEMPIC, 2 MG/DOSE,) 8 MG/3ML SOPN Inject 2 mg into the skin once a week. Monday 9 mL 2   Continuous Glucose Sensor (FREESTYLE LIBRE 2 SENSOR) MISC APPLY  SENSOR TO SKIN FOR CONTINUOUS BLOOD SUGAR MONITORING, REPLACE EVERY 14 DAYS 2 each 6   DROPLET PEN NEEDLES 29G X MISC USE ONCE DAILY WITH VICTOZA PEN 100 each 1   gentamicin cream (GARAMYCIN) 0.1 % Apply to right foot once daily. (Patient not taking: Reported on 11/13/2023) 30 g 0   glucose blood (ACCU-CHEK GUIDE) test strip Use as instructed 100 each 12   Insulin Pen Needle (PEN NEEDLES) 32G X 4 MM MISC Use as directed with insulin four times daily 100 each 12   nitroGLYCERIN (NITROSTAT) 0.4 MG SL tablet DISSOLVE  1 TABLET UNDER THE TONGUE FOR CHEST PAIN. IF PAIN REMAINS AFTER 5 MINUTES, CALL 911 AND REPEAT DOSE. MAX 3 TABLETS IN 15 MINUTES 100 tablet 0   NON FORMULARY Pt uses a c-pap nightly     traMADol (ULTRAM) 50 MG tablet Take 1 tablet (50 mg total) by mouth every 12 (twelve) hours as needed for up to 8 days. 15 tablet 0    Results for orders placed or performed during the hospital encounter of 11/16/23 (from the past 48 hours)  Glucose, capillary     Status: Abnormal   Collection Time: 11/16/23  9:47 AM  Result Value Ref Range   Glucose-Capillary 183 (H) 70 - 99 mg/dL    Comment: Glucose reference range applies only to samples taken after fasting for at least 8 hours.   Comment 1 Notify RN   Glucose, capillary     Status: Abnormal   Collection Time: 11/16/23 12:23 PM  Result Value Ref Range   Glucose-Capillary 145 (H) 70 - 99 mg/dL    Comment: Glucose reference range applies only to samples taken after fasting for at least 8 hours.   No results found.  Review of Systems  All other systems reviewed and are negative.   Blood pressure (!) 151/69, pulse 65, temperature 98.1 F (36.7 C), resp. rate 18, height 5\' 3"  (1.6 m), weight 123.8 kg, SpO2 95%. Physical Exam Constitutional:      Appearance: Normal appearance. He is obese.  HENT:     Head: Normocephalic and atraumatic.     Right Ear: External ear normal.     Left Ear: External ear normal.     Nose: Nose normal.      Mouth/Throat:     Mouth: Mucous membranes are moist.     Pharynx: Oropharynx is clear.  Eyes:     Extraocular Movements: Extraocular movements intact.     Pupils: Pupils are equal, round, and reactive to light.  Cardiovascular:     Rate and Rhythm: Normal rate.  Pulmonary:     Effort: Pulmonary effort is normal.  Skin:    General: Skin is warm and dry.  Neurological:     General: No focal deficit present.     Mental Status: He is alert and oriented to person, place, and time.  Psychiatric:        Mood and Affect: Mood normal.        Behavior: Behavior normal.        Thought Content: Thought content normal.        Judgment: Judgment normal.      Assessment/Plan Left chronic mastoiditis and otorrhea  To OR for left open cavity mastoidectomy.  Overnight observation.  Christia Reading, MD 11/16/2023, 12:41 PM

## 2023-11-16 NOTE — Anesthesia Procedure Notes (Signed)
 Procedure Name: Intubation Date/Time: 11/16/2023 1:10 PM  Performed by: Stanton Kidney, CRNAPre-anesthesia Checklist: Patient identified, Patient being monitored, Timeout performed, Emergency Drugs available and Suction available Patient Re-evaluated:Patient Re-evaluated prior to induction Oxygen Delivery Method: Circle system utilized Preoxygenation: Pre-oxygenation with 100% oxygen Induction Type: IV induction Ventilation: Mask ventilation without difficulty Laryngoscope Size: 3 and McGrath Grade View: Grade I Tube type: Oral Tube size: 7.5 mm Number of attempts: 1 Airway Equipment and Method: Stylet Placement Confirmation: ETT inserted through vocal cords under direct vision, positive ETCO2 and breath sounds checked- equal and bilateral Secured at: 21 cm Tube secured with: Tape Dental Injury: Teeth and Oropharynx as per pre-operative assessment

## 2023-11-16 NOTE — Transfer of Care (Signed)
 Immediate Anesthesia Transfer of Care Note  Patient: [name] [name]  Procedure(s) Performed: LEFT OPEN CAVITY TYMPANOMASTOIDECTOMY (Left: Ear)  Patient Location: PACU  Anesthesia Type:General  Level of Consciousness: awake, oriented, and drowsy  Airway & Oxygen Therapy: Patient Spontanous Breathing and Patient connected to face mask oxygen  Post-op Assessment: Report given to RN, Post -op Vital signs reviewed and stable, and Patient moving all extremities  Post vital signs: Reviewed and stable  Last Vitals:  Vitals Value Taken Time  BP 168/72 11/16/23 1652  Temp    Pulse 76 11/16/23 1655  Resp 16 11/16/23 1655  SpO2 92 % 11/16/23 1655  Vitals shown include unfiled device data.  Last Pain:  Vitals:   11/16/23 1024  PainSc: 0-No pain         Complications: No notable events documented.

## 2023-11-16 NOTE — Anesthesia Postprocedure Evaluation (Signed)
 Anesthesia Post Note  Patient: [name] [name]  Procedure(s) Performed: LEFT OPEN CAVITY TYMPANOMASTOIDECTOMY (Left: Ear)     Patient location during evaluation: PACU Anesthesia Type: General Level of consciousness: awake and alert Pain management: pain level controlled Vital Signs Assessment: post-procedure vital signs reviewed and stable Respiratory status: spontaneous breathing, nonlabored ventilation, respiratory function stable and patient connected to nasal cannula oxygen Cardiovascular status: blood pressure returned to baseline and stable Postop Assessment: no apparent nausea or vomiting Anesthetic complications: no   No notable events documented.  Last Vitals:  Vitals:   11/16/23 1715 11/16/23 1730  BP: 119/67 131/74  Pulse: 80 76  Resp: 14 15  Temp:  36.7 C  SpO2: 97% 97%    Last Pain:  Vitals:   11/16/23 1715  PainSc: 2                  Collene Schlichter

## 2023-11-16 NOTE — Op Note (Signed)
 Preop diagnosis: Left chronic mastoiditis and otorrhea Postop diagnosis: same Procedure: Left open cavity tympanomastoidectomy Surgeon: Jenne Pane Anesth: General endotracheal anesthesia local 1% lidocaine with 1:100,000 epinephrine Compl: None Findings: Left canal skin, TM, and middle ear tissues edematous.  Collar button myringotomy tube found in middle ear space.  Ossicles appeared articulated but the incus and head of malleus were removed.  The corda tympani nerve required sacrifice.  The mastoid was quite restricted with very little air space. Description:  After discussing risks, benefits, and alternatives, the patient was brought to the operative suite and placed on the operative table in the supine position.  Anesthesia was induced and the patient was intubated by the Anesthesia team without difficulty.  The eyes were taped closed and the bed was turned 90 degrees from Anesthesia.  The patient was given IV antibiotics during the case.  The nerve integrity monitor was placed for monitoring the left facial nerve.  Hair behind the ear was shaved back exposing the incision site.  The left ear was prepped and draped in sterile fashion.  The ear was inspected under the operating microscope using an ear speculum. The canal and post-auricular site were injected with local anesthetic.  Under the operating microscope, radial canal incisions were made at 12 o'clock and 6 o'clock and the posterior circumferential incision was made.  The tympanomeatal flap was elevated to the annulus.  The post-auricular incision was then made and deepened to the mastoid periosteum and temporalis fascia.  A T-shaped incision was made through the periosteum and it was elevated, exposing the mastoid cortex including down the canal.  The external ear was retracted anteriorly with a 1/4 inch Penrose drain and self-retaining retractors were added.  A drill with a 5 mm cutting burr was used to remove the mastoid cortex and to skeletonize  the sigmoid sinus and tegmen tympani.  The mastoid was small and not well-aerated.  Drilling included removing the posterior canal wall.  With further dissection, the mastoid antrum was approached and the incus identified.  Through the canal, the tympanomeatal flap was further elevated lifting the annulus out of its groove and exposing the edematous middle ear.  A grommet myringotomy tube was identified in the lower mesotympanum and was removed.  The posterior canal wall was then further drilled, opening the mastoid antrum and allowing full exposure of the incus.  The corda tympani nerve was identified but was sacrificed.  The incus was cleared of soft tissue and the incudostapedial joint divided. The incus was then removed and passed to nursing for pathology.  The head of the malleus was then exposed and was removed after dividing it from the long process.  The long process did come loose during removal of the head and was repositioned.  At this point, the temporalis fascia was exposed.  The fascia layer was incised inferiorly.  The fascia was elevated and a rectangular graft was removed with scissors and placed in the fascia press, opening it under a heat lamp after a few minutes.  The tympanomeatal flap was laid down after placing a couple of pieces of gelfoam soaked with Ciprodex in the middle ear.  The fascia graft was laid from the middle ear, over the facial ridge, to the mastoid cavity.  The flap was incised from lateral to allow it to lay down better.  Pieces of Ciprodex-soaked gelfoam were then placed over the flap and coating the mastoid.  The meatoplasty was then performed by injecting local anesthetic in the conchal bowl.  An 11 blade scalpel was used to penetrate the bowl superiorly and incising the canal fully in a radial fashion.  Conchal cartilage was dissected superior and inferiorly and removed.  The skin edges were then sutured to deep structures to open the canal.  The ear was then laid  back in position.  The subcutaneous layer was closed with 3-0 and 4-0 Vicryl in a simple, interrupted fashion.  The skin was closed with 5-0 plain gut in a simple, running fashion.  The ear canal was suctioned and pieces of Merocel coated in Bacitracin ointment were added and saturated with saline.  Drapes were removed and the patient was cleaned off.  A standard mastoid dressing was added.  The patient was then returned to Anesthesia for wake-up, was extubated, and moved to the recovery room in stable condition.

## 2023-11-17 ENCOUNTER — Encounter (HOSPITAL_COMMUNITY): Payer: Self-pay | Admitting: Otolaryngology

## 2023-11-17 DIAGNOSIS — H7012 Chronic mastoiditis, left ear: Secondary | ICD-10-CM | POA: Diagnosis not present

## 2023-11-17 LAB — GLUCOSE, CAPILLARY: Glucose-Capillary: 141 mg/dL — ABNORMAL HIGH (ref 70–99)

## 2023-11-17 MED ORDER — HYDROCODONE-ACETAMINOPHEN 5-325 MG PO TABS
1.0000 | ORAL_TABLET | Freq: Four times a day (QID) | ORAL | 0 refills | Status: DC | PRN
Start: 2023-11-17 — End: 2023-11-27

## 2023-11-17 NOTE — Discharge Summary (Signed)
 Physician Discharge Summary  Patient ID: [name] [name] MRN: [id number] DOB/AGE: [birth year] 63 y.o.  Admit date: 11/16/2023 Discharge date: 11/17/2023  Admission Diagnoses: Chronic mastoiditis  Discharge Diagnoses:  Principal Problem:   Chronic mastoiditis   Discharged Condition: good  Hospital Course: 63 year old male with diabetes and COPD who also has a history of chronic ear disease who presents for surgical management.  See operative note.  He was observed overnight due to his medical problems and did quite well.  His mastoid dressing did not stay on very well due to head and neck shape.  There was expected bloody drainage from the ear.  On POD 1, the dressing was fully removed and he is felt stable for discharge home.  Consults: None  Significant Diagnostic Studies: None  Treatments: surgery: Left open cavity tympanomastoidectomy  Discharge Exam: Blood pressure 135/69, pulse 65, temperature 98.7 F (37.1 C), temperature source Oral, resp. rate 18, height 5\' 7"  (1.702 m), weight 121.6 kg, SpO2 93%. General appearance: alert, cooperative, and no distress Ears: normal facial movement, mastoid dressing removed, packs in place in ear canal opening  Disposition:  Discharge disposition: 01-Home or Self Care       Discharge Instructions     Diet - low sodium heart healthy   Complete by: As directed    Discharge instructions   Complete by: As directed    Change ointment(Vaseline or Bacitracin)-coated cotton ball in left ear at least once daily, more often as needed, to catch expected bloody drainage.  Use cup over ear when washing hair in shower.  Add ointment to incision twice daily.  Keep blood sugar under control.   Increase activity slowly   Complete by: As directed       Allergies as of 11/17/2023       Reactions   Shellfish Allergy Nausea And Vomiting   OYSTERS   Yellow Dye Other (See Comments)   Gi intolerance         Medication List     TAKE these  medications    Accu-Chek Guide test strip Generic drug: glucose blood Use as instructed   albuterol 108 (90 Base) MCG/ACT inhaler Commonly known as: VENTOLIN HFA Inhale 1 puff into the lungs every 6 (six) hours as needed for wheezing or shortness of breath.   aspirin 81 MG chewable tablet Chew 1 tablet (81 mg total) by mouth daily.   atorvastatin 80 MG tablet Commonly known as: LIPITOR TAKE 1 TABLET BY MOUTH DAILY What changed: when to take this   carvedilol 3.125 MG tablet Commonly known as: COREG TAKE 1 TABLET BY MOUTH TWICE A DAY WITH A MEAL   citalopram 20 MG tablet Commonly known as: CELEXA Take 1 tablet (20 mg total) by mouth daily.   Dulera 200-5 MCG/ACT Aero Generic drug: mometasone-formoterol Inhale 2 puffs into the lungs in the morning and at bedtime. What changed: how much to take   Fish Oil 1000 MG Caps Take 1 capsule (1,000 mg total) by mouth daily.   FreeStyle Libre 2 Sensor Misc APPLY SENSOR TO SKIN FOR CONTINUOUS BLOOD SUGAR MONITORING, REPLACE EVERY 14 DAYS   gentamicin cream 0.1 % Commonly known as: GARAMYCIN Apply to right foot once daily.   HYDROcodone-acetaminophen 5-325 MG tablet Commonly known as: NORCO/VICODIN Take 1-2 tablets by mouth every 6 (six) hours as needed for severe pain (pain score 7-10).   Jardiance 25 MG Tabs tablet Generic drug: empagliflozin TAKE 1 TABLET BY MOUTH DAILY   Lantus SoloStar 100  UNIT/ML Solostar Pen Generic drug: insulin glargine Inject 14 Units into the skin daily.   lisinopril 2.5 MG tablet Commonly known as: ZESTRIL TAKE 1 TABLET BY MOUTH TWICE A DAY   Melatonin 2.5 MG Caps Take 5 mg by mouth at bedtime.   metFORMIN 1000 MG tablet Commonly known as: GLUCOPHAGE TAKE ONE TABLET BY MOUTH TWICE A DAY WITH MEALS   multivitamin tablet Take 1 tablet by mouth daily.   nitroGLYCERIN 0.4 MG SL tablet Commonly known as: NITROSTAT DISSOLVE 1 TABLET UNDER THE TONGUE FOR CHEST PAIN. IF PAIN REMAINS AFTER 5  MINUTES, CALL 911 AND REPEAT DOSE. MAX 3 TABLETS IN 15 MINUTES   NON FORMULARY Pt uses a c-pap nightly   omeprazole 20 MG capsule Commonly known as: PRILOSEC Take 1 capsule (20 mg total) by mouth 2 (two) times daily before a meal.   Ozempic (2 MG/DOSE) 8 MG/3ML Sopn Generic drug: Semaglutide (2 MG/DOSE) Inject 2 mg into the skin once a week. Monday   Pen Needles 32G X 4 MM Misc Use as directed with insulin four times daily   Droplet Pen Needles 29G X Misc Generic drug: Insulin Pen Needle USE ONCE DAILY WITH VICTOZA PEN   prasugrel 10 MG Tabs tablet Commonly known as: EFFIENT Take 1 tablet (10 mg total) by mouth daily.   traMADol 50 MG tablet Commonly known as: ULTRAM Take 1 tablet (50 mg total) by mouth every 12 (twelve) hours as needed for up to 8 days.        Follow-up Information     Christia Reading, MD. Schedule an appointment as soon as possible for a visit on 11/21/2023.   Specialty: Otolaryngology Contact information: [address] [place] [address] [phone]                 Signed: Christia Reading 11/17/2023, 11:03 AM

## 2023-11-17 NOTE — Plan of Care (Signed)
 Patient AAOx4, slow speech. S/p left ear surgery. Dressing changed this morning. PRN pain medication given with + effect. 0.45% NaCl w/ Kcl running at 60mL/hr. Safety precautions maintained.  Discharge instructions discussed with patient, verbalized understanding. RPIV removed. MD Jenne Pane asked for bacitracin and sterile cotton swabs to be given to patient, per pharmacy new order needed to send bacitracin. Called office and left voicemail for the PA. Never received call back. Informed patient to obtain bacitracin upon d/c. Patient transported to discharge lounge. Left with all belongings in no acute or respiratory distress.   Problem: Education: Goal: Knowledge of General Education information will improve Description: Including pain rating scale, medication(s)/side effects and non-pharmacologic comfort measures Outcome: Progressing   Problem: Clinical Measurements: Goal: Will remain free from infection Outcome: Progressing   Problem: Nutrition: Goal: Adequate nutrition will be maintained Outcome: Progressing

## 2023-11-22 ENCOUNTER — Other Ambulatory Visit: Payer: Self-pay

## 2023-11-22 MED ORDER — DROPLET PEN NEEDLES 29G X 12MM MISC: 1 refills | Status: DC

## 2023-11-27 ENCOUNTER — Encounter: Payer: Self-pay | Admitting: Family Medicine

## 2023-11-27 ENCOUNTER — Ambulatory Visit (INDEPENDENT_AMBULATORY_CARE_PROVIDER_SITE_OTHER): Admitting: Family Medicine

## 2023-11-27 ENCOUNTER — Ambulatory Visit: Payer: Medicaid Other | Admitting: Family Medicine

## 2023-11-27 ENCOUNTER — Ambulatory Visit: Admitting: Family Medicine

## 2023-11-27 VITALS — BP 142/56 | HR 66 | Ht 67.0 in | Wt 274.4 lb

## 2023-11-27 DIAGNOSIS — I10 Essential (primary) hypertension: Secondary | ICD-10-CM | POA: Diagnosis not present

## 2023-11-27 DIAGNOSIS — E119 Type 2 diabetes mellitus without complications: Secondary | ICD-10-CM | POA: Diagnosis present

## 2023-11-27 DIAGNOSIS — Z794 Long term (current) use of insulin: Secondary | ICD-10-CM | POA: Diagnosis not present

## 2023-11-27 MED ORDER — BLOOD PRESSURE CUFF MISC: 1.0000 | 0 refills | Status: DC

## 2023-11-27 NOTE — Progress Notes (Signed)
    SUBJECTIVE:   CHIEF COMPLAINT / HPI: follow-up  Had surgery on ear last week. Doing well. Has follow-up with ENT next week.  Follow-up for chronic conditions.  Last A1c 8.3. On Insulin, Jardiance, Metformin and Semaglutide. Taking 14-16 units of Lantus per day. Not taking Novolog.  Patient brought in medications. Using [place] 2. In the morning 146. Updated in rooming tab.   Reports already got x2 doses of shingles vaccine.  Does not have BP cuff at home.   PERTINENT  PMH / PSH: HTN, CAD, OSA, GERD, T2DM  OBJECTIVE:   BP (!) 142/56   Pulse 66   Ht 5\' 7"  (1.702 m)   Wt 274 lb 6 oz (124.5 kg)   SpO2 96%   BMI 42.97 kg/m   General: NAD Neuro: A&O Cardiovascular: RRR, no murmurs, no peripheral edema Respiratory: normal WOB on RA, CTAB, no wheezes, ronchi or rales Extremities: Moving all 4 extremities equally, feet DP pulse 2+, sensation intact bilaterally   ASSESSMENT/PLAN:   Assessment & Plan Type 2 diabetes mellitus without complication, with long-term current use of insulin (HCC) Slightly above goal A1c <8. Normal foot exam. -Increase Lantus to 20u daily -3 month A1c check -Continue Metformin, Jardiance, Ozempic -Urine microalbumin/creatinine ratio Essential hypertension Systolic mildly elevated today. -Continue Lisinopril, Coreg -Instructed to measure at home and bring in log  Return in about 3 months (around 02/27/2024) for Diabetes f/u.  Celine Mans, MD [facility]

## 2023-11-27 NOTE — Assessment & Plan Note (Signed)
 Slightly above goal A1c <8. Normal foot exam. -Increase Lantus to 20u daily -3 month A1c check -Continue Metformin, Jardiance, Ozempic -Urine microalbumin/creatinine ratio

## 2023-11-27 NOTE — Patient Instructions (Addendum)
 It was great to see you! Thank you for allowing me to participate in your care!  Our plans for today:  -Please increase your Lantus insulin to 20u a day.  -DO NOT take the insulin twice a day. -Keep watching your sugars in the morning. -Please measure your BP at home. -We will discuss your blood pressure at next visit.   Please arrive 15 minutes PRIOR to your next scheduled appointment time! If you do not, this affects OTHER patients' care.  Take care and seek immediate care sooner if you develop any concerns.   Celine Mans, MD, PGY-2 [facility] 1:19 PM 11/27/2023  [facility]

## 2023-11-27 NOTE — Assessment & Plan Note (Signed)
 Systolic mildly elevated today. -Continue Lisinopril, Coreg -Instructed to measure at home and bring in log

## 2023-11-28 LAB — MICROALBUMIN / CREATININE URINE RATIO
Creatinine, Urine: 43.1 mg/dL
Microalb/Creat Ratio: 366 mg/g{creat} — ABNORMAL HIGH (ref 0–29)
Microalbumin, Urine: 157.6 ug/mL

## 2023-11-29 ENCOUNTER — Telehealth: Payer: Self-pay | Admitting: Family Medicine

## 2023-11-29 NOTE — Telephone Encounter (Signed)
 Called patient regarding elevated microalbumin to creatinine ratio. Explained that it is measurement of diabetic kidney disease. Explained patient is already on jardiance and lisinopril. Discussed we may increase lisinopril at next visit if BP is also elevated. Patient agreeable to plan.

## 2023-12-12 ENCOUNTER — Other Ambulatory Visit: Payer: Self-pay | Admitting: Family Medicine

## 2023-12-12 DIAGNOSIS — Z794 Long term (current) use of insulin: Secondary | ICD-10-CM

## 2023-12-14 ENCOUNTER — Telehealth: Payer: Self-pay

## 2023-12-14 NOTE — Telephone Encounter (Signed)
 Pharmacy Patient Advocate Encounter   Received notification from CoverMyMeds that prior authorization for FREESTYLE LIBRE 2 SENSORS is required/requested.   Insurance verification completed.   The patient is insured through  [facility] MEDICAID  .   PA required; PA submitted to above mentioned insurance via CoverMyMeds Key/confirmation #/EOC [id number]. Status is pending

## 2023-12-15 NOTE — Telephone Encounter (Signed)
 Pharmacy Patient Advocate Encounter  Received notification from [facility] / MEDICAID that Prior Authorization for FREESTYLE LIBRE 2 PLUS SENSOR has been APPROVED from 12/14/23 to 12/13/24   PA #/Case ID/Reference #: [id number]

## 2023-12-19 ENCOUNTER — Ambulatory Visit (HOSPITAL_BASED_OUTPATIENT_CLINIC_OR_DEPARTMENT_OTHER): Payer: Medicaid Other | Attending: Nurse Practitioner | Admitting: Internal Medicine

## 2023-12-19 ENCOUNTER — Telehealth: Payer: Self-pay

## 2023-12-19 VITALS — Ht 67.0 in | Wt 260.0 lb

## 2023-12-19 DIAGNOSIS — G4761 Periodic limb movement disorder: Secondary | ICD-10-CM | POA: Insufficient documentation

## 2023-12-19 DIAGNOSIS — G4733 Obstructive sleep apnea (adult) (pediatric): Secondary | ICD-10-CM | POA: Insufficient documentation

## 2023-12-19 DIAGNOSIS — K219 Gastro-esophageal reflux disease without esophagitis: Secondary | ICD-10-CM | POA: Insufficient documentation

## 2023-12-19 DIAGNOSIS — E1165 Type 2 diabetes mellitus with hyperglycemia: Secondary | ICD-10-CM

## 2023-12-19 MED ORDER — LANTUS SOLOSTAR 100 UNIT/ML ~~LOC~~ SOPN: 12.0000 [IU] | PEN_INJECTOR | Freq: Every day | SUBCUTANEOUS | Status: DC

## 2023-12-19 NOTE — Telephone Encounter (Signed)
 Patient calls nurse line regarding concerns with low blood sugar levels. He reports that for the last several days he has been noticing levels in the high 50's. He was recently increased to 20 units of Lantus when in the office on 11/27/23.   He reports that due to low readings, he did not take Ozempic this week and was unsure on if he should not take Lantus today or if he should decrease the dose. Reports blood sugar this morning of 57. He states that he is not at home and is unable to access his current CBG.   Currently is aymptomatic and has been asymptomatic with low readings.   Scheduled him for follow up with Dr. Raymondo Band on Friday. Discussed recommendations for CBG under 70 and provided with ED precautions.   Please advise how patient should proceed with Lantus for the next few days until scheduled visit. Thanks.   Veronda Prude, RN

## 2023-12-19 NOTE — Telephone Encounter (Signed)
 Patient contacted for follow-up of reported low glucose readings.   Since last contact patient reports he has been taking lantus (insulin glargine) 20 units ONCE daily.     He states he has had multiple low episodes over the last several weeks.  Following discussion we agreed to lower dose from 20 to 12 units once daily until I see him on Friday 4/11   Total time with patient call and documentation of interaction: 11 minutes.

## 2023-12-19 NOTE — Telephone Encounter (Signed)
 Reviewed and agree with Dr Macky Lower plan.

## 2023-12-22 ENCOUNTER — Encounter: Payer: Self-pay | Admitting: Pharmacist

## 2023-12-22 ENCOUNTER — Ambulatory Visit: Admitting: Pharmacist

## 2023-12-22 VITALS — BP 115/70 | HR 62 | Temp 98.1°F | Wt 249.4 lb

## 2023-12-22 DIAGNOSIS — Z87891 Personal history of nicotine dependence: Secondary | ICD-10-CM

## 2023-12-22 DIAGNOSIS — Z794 Long term (current) use of insulin: Secondary | ICD-10-CM

## 2023-12-22 DIAGNOSIS — E119 Type 2 diabetes mellitus without complications: Secondary | ICD-10-CM

## 2023-12-22 NOTE — Progress Notes (Signed)
    S:     Chief Complaint  Patient presents with   Medication Management    Diabetes - CGM    63 y.o. male who presents for diabetes evaluation, education, and management. Patient arrives in good spirits and presents without any assistance.  Patient was referred and last seen by Primary Care Provider, Dr. Velna Ochs, on 11/27/2023.   PMH is significant for HTN, GERD, HLD, T2DM, depression, CAD, obesity, OSA, reactive airway disease.  At last visit, Lantus (insulin glargine) was increased to 20 units daily and all other medications that same.   Following reports of low glucose with symptoms on 4/6, 4/7 and 4/8 most likely related to diet, exercise and weight loss, Lantus (insulin glargine was reduced to 12 units daily)  Current diabetes medications include: metformin, insulin glargine 10-12 units daily, Jardiance 25mg  and Semaglutide 2mg  weekly on Monday.    Patient reports adherence to taking all medications as prescribed.    Do you feel that your medications are working for you? yes Have you been experiencing any side effects to the medications prescribed? No - denies hypoglycemia since recent insulin adjustment.   O:  Libre3 CGM Download today 12/22/2023 % Time CGM is active: 55% Average Glucose: 124 mg/dL Glucose Management Indicator: 6.3%  Glucose Variability: 34% (goal <36%) Time in Goal:  - Time in range 70-180: 79% - Time above range: 9% - Time below range: 12% Observed patterns: Overall good control with some lows which resolved with recent insulin dose reduction.    Lab Results  Component Value Date   HGBA1C 8.6 (H) 11/13/2023   Vitals:   12/22/23 1034  BP: 115/70  Pulse: 62  Temp: 98.1 F (36.7 C)  SpO2: 96%    Lipid Panel     Component Value Date/Time   CHOL 89 (L) 10/25/2022 0946   TRIG 76 10/25/2022 0946   HDL 41 10/25/2022 0946   CHOLHDL 2.2 10/25/2022 0946   CHOLHDL 3.7 03/29/2016 1227   VLDL 53 (H) 03/29/2016 1227   LDLCALC 32 10/25/2022 0946    LDLDIRECT 70 09/02/2020 1057    Clinical Atherosclerotic Cardiovascular Disease (ASCVD): Yes  The ASCVD Risk score (Arnett DK, et al., 2019) failed to calculate for the following reasons:   Risk score cannot be calculated because patient has a medical history suggesting prior/existing ASCVD   Patient is participating in a Managed Medicaid Plan:  Yes   A/P: Diabetes longstanding currently well controlled without symptomatic hypoglycemia based on Libre 2 readings. . Patient is able to verbalize appropriate hypoglycemia management plan. Medication adherence appears good. Patient has los ~ 10 lbs since last visit -Continued basal insulin Lantus (insulin glargine) at 12 units once daily.   -Continued GLP-1 Ozempic (semaglutide) at 2mg  weekly.   -Continued SGLT2-I Jardiance (empagliflozin) 25 mg. Counseled on sick day rules. -Continued metformin 1000mg  BID.  -Extensively discussed pathophysiology of diabetes, recommended lifestyle interventions, dietary effects on blood sugar control. Encouraged continued efforts with weight loss.  -Counseled on s/sx of and management of hypoglycemia.  Advised to call our office if glucose readings are consistently less than 80  Written patient instructions provided. Patient verbalized understanding of treatment plan.  Total time in face to face counseling 29 minutes.    Follow-up:  Pharmacist TBD PCP clinic visit in Dr. Velna Ochs 02/07/2024 Patient seen with Darolyn Rua, PharmD Candidate.

## 2023-12-22 NOTE — Assessment & Plan Note (Signed)
 Reports continued abstinence.  States smoking is "ancient history".  Encouraged continued abstinence.

## 2023-12-22 NOTE — Assessment & Plan Note (Signed)
 Diabetes longstanding currently well controlled without symptomatic hypoglycemia based on Libre 2 readings. . Patient is able to verbalize appropriate hypoglycemia management plan. Medication adherence appears good. Patient has los ~ 10 lbs since last visit -Continued basal insulin Lantus (insulin glargine) at 12 units once daily.   -Continued GLP-1 Ozempic (semaglutide) at 2mg  weekly.   -Continued SGLT2-I Jardiance (empagliflozin) 25 mg. Counseled on sick day rules. -Continued metformin 1000mg  BID.  -Extensively discussed pathophysiology of diabetes, recommended lifestyle interventions, dietary effects on blood sugar control. Encouraged continued efforts with weight loss.  -Counseled on s/sx of and management of hypoglycemia.  Advised to call our office if glucose readings are consistently less than 80

## 2023-12-22 NOTE — Patient Instructions (Signed)
It was nice to see you today!  Your goal blood sugar is 80-130 before eating and less than 180 after eating.  Medication Changes:  Continue all medications the same.   Keep up the good work with diet and exercise. Aim for a diet full of vegetables, fruit and lean meats (chicken, [place], fish). Try to limit salt intake by eating fresh or frozen vegetables (instead of canned), rinse canned vegetables prior to cooking and do not add any additional salt to meals.

## 2023-12-23 DIAGNOSIS — K219 Gastro-esophageal reflux disease without esophagitis: Secondary | ICD-10-CM | POA: Diagnosis not present

## 2023-12-23 NOTE — Procedures (Signed)
 [facility] [address] [place], [place] [address] Tel: [phone]   Fax: [phone]  Titration Interpretation  Patient Name:  [name] [name], [name] [name] Date:  12/19/2023 Referring Physician:  Mariah Shines. Cobb, NP  Indications for Polysomnography The patient is a 63 year old Male who is 5\' 7"  and weighs 260.0 lbs. His BMI equals 40.8.  A full night titration treatment study was performed.  Medication  No medications were reported taken during the night.   Baseline Diagnostic NPSG 122/7/20- AHI 110.6.hr, desaturation to 80%, body weight 190 lbs  Polysomnogram Data A full night polysomnogram recorded the standard physiologic parameters including EEG, EOG, EMG, EKG, nasal and oral airflow.  Respiratory parameters of chest and abdominal movements were recorded with Respiratory Inductance Plethysmography belts.  Oxygen saturation was recorded by pulse oximetry.   Sleep Architecture The total recording time of the polysomnogram was 382.0 minutes.  The total sleep time was 294.0 minutes.  The patient spent 7.7% of total sleep time in Stage N1, 67.9% in Stage N2, 1.5% in Stages N3, and 23.0% in REM.  Sleep latency was 18.4 minutes.  REM latency was 175.0 minutes.  Sleep Efficiency was 77.0%.  Wake after Sleep Onset time was 69.5 minutes.  Titration Summary The patient was titrated at pressures ranging from 8cm H20 with supplemental oxygen at - up to 19cm H20 with supplemental oxygen at -.  The last pressure used in the study was 19cm H20 with supplemental oxygen at -.  Respiratory Events The polysomnogram revealed a presence of 12 obstructive, - central, and - mixed apneas resulting in an Apnea index of 2.4 events per hour.  There were 56 hypopneas (>=3% desaturation and/or arousal) resulting in an Apnea\Hypopnea Index (AHI >=3% desaturation and/or arousal) of 13.9 events per hour.  There were 32 hypopneas (>=4% desaturation) resulting in an  Apnea\Hypopnea Index (AHI >=4% desaturation) of 9.0 events per hour.  There were 20 Respiratory Effort Related Arousals resulting in a RERA index of 4.1 events per hour. The Respiratory Disturbance Index is 18.0 events per hour.  The snore index was 133.9 events per hour.  Mean oxygen saturation was 92.0%.  The lowest oxygen saturation during sleep was 87.0%.  Time spent <=88% oxygen saturation was 9.5 minutes (2.5%).  Limb Activity There were 206 limb movements recorded.  Of this total, 199 were classified as PLMs.  Of the PLMs, 17 were associated with arousals.  The Limb Movement index was 42.0 per hour while the PLM index was 40.6 per hour.  Cardiac Summary The average pulse rate was 68.2 bpm.  The minimum pulse rate was 56.0 bpm while the maximum pulse rate was 84.0 bpm.  Cardiac rhythm was normal with PVCs..  Comment: CPAP titration to 19 cwp with residual AHI (4 %) 0.6/hr, Minimum O2 saturation 87%, mean 92.2%.  Diagnosis: Obstructive sleep apnea, Periodic Limb Movement with Arousal  Recommendations:  Suggest CPAP 19 or autopap 10-20. Consider BIPAP titration if higher pressures needed. Consider treatment for periodic limb movement sleep disturbance. Patient wore a large F&P Simplus full face mask with heated humidification.   This study was personally reviewed and electronically signed by: Dr. Rosa [employer] Accredited Board Certified in Sleep Medicine Date/Time: 12/23/23  11:53   Titration Report  Patient Name: [name] [name], [name] [name] Date: 12/19/2023  Date of Birth: 1960/11/24 Study Type: CPAP Titration  Age: 63 year MRN #: [id number]  Sex: Male Interpreting Physician: [employer] [id number]  Height: 5\' 7"  Referring Physician:  Mariah Shines Cobb, NP  Weight: 260.0 lbs Recording Tech: Antoine Bathe Spruill RRT RPSGT RST  BMI: 40.8 Scoring Tech: Roosvelt Colla RRT RPSGT RST  ESS: 5 Neck Size: 21  Mask Type [employer] Simplus Final Pressure: 19cm H20  Mask Size: Large Supplemental O2: -    Study Overview  Lights Off: 10:39:39 PM  Count Index  Lights On: 05:01:40 AM Awakenings: 16 3.3  Time in Bed: 382.0 min. Arousals: 132 26.9  Total Sleep Time: 294.0 min. AHI (>=3% Desat and/or Ar.): 68 13.9   Sleep Efficiency: 77.0% AHI (>=4% Desat): 44 9.0   Sleep Latency: 18.4 min. Limb Movements: 206 42.0  Wake After Sleep Onset: 69.5 min. Snore: 656 133.9  REM Latency from Sleep Onset: 175.0 min. Desaturations: 67 13.7     Minimum SpO2 TST: 87.0%    Sleep Architecture  % of Time in Bed Stages Time (mins) % Sleep Time  Wake 88.5   Stage N1 22.5 7.7%  Stage N2 199.5 67.9%  Stage N3 4.5 1.5%  REM 67.5 23.0%   Arousal Summary   NREM REM Sleep Index  Respiratory Arousals 65 2 67 13.7  PLM Arousals 17 - 17 3.5  Isolated Limb Movement Arousals 1 - 1 0.2  Snore Arousals 7 3 10  2.0  Spontaneous Arousals 28 9 37 7.6  Total 118 14 132 26.9   Limb Movement Summary   Count Index  Isolated Limb Movements 7 1.4  Periodic Limb Movements (PLMs) 199 40.6  Total Limb Movements 206 42.0    Respiratory Summary   By Sleep Stage By Body Position Total   NREM REM Supine Non-Supine   Time (min) 226.5 67.5 0.5 293.5 294.0         Obstructive Apnea 12 - 1 11 12   Mixed Apnea - - - - -  Central Apnea - - - - -  Total Apneas 12 - 1 11 12   Total Apnea Index 3.2 - 120.0 2.2 2.4         Hypopneas (>=3% Desat and/or Ar.) 54 2 - 56 56  AHI (>=3% Desat and/or Ar.) 17.5 1.8 120.0 13.7 13.9         Hypopneas (>=4% Desat) 32 - - 32 32  AHI (>=4% Desat) 11.7 - 120.0 8.8 9.0          RERAs 19 1 - 20 20  RERA Index 5.0 0.9 - 4.1 4.1         RDI 22.5 2.7 120.0 17.8 18.0     Respiratory Event Durations   Apnea Hypopnea   NREM REM NREM REM  Average (seconds) 16.7 - 29.3 19.2  Maximum (seconds) 26.5 - 62.8 24.1    Oxygen Saturation Summary   Wake NREM REM TST TIB  Average SpO2 93.6% 91.4% 91.9% 91.5% 92.0%  Minimum SpO2 87.0% 87.0% 90.0% 87.0% 87.0%  Maximum SpO2 97.0% 96.0%  94.0% 96.0% 97.0%   Oxygen Saturation Distribution  Range (%) Time in range (min) Time in range (%)  90.0 - 100.0 316.1 84.1%  80.0 - 90.0 59.8 15.9%  70.0 - 80.0 - -  60.0 - 70.0 - -  50.0 - 60.0 - -  0.0 - 50.0 - -  Time Spent <=88% SpO2  Range (%) Time in range (min) Time in range (%)  0.0 - 88.0 9.5 2.5%      Count Index  Desaturations 67 13.7    Cardiac Summary   Wake NREM REM Sleep Total  Average Pulse Rate (  BPM) 63.9 67.6 75.5 69.4 68.2  Minimum Pulse Rate (BPM) 57.0 56.0 58.0 56.0 56.0  Maximum Pulse Rate (BPM) 83.0 83.0 84.0 84.0 84.0   Pulse Rate Distribution:  Range (bpm) Time in range (min) Time in range (%)  0.0 - 40.0 - -  40.0 - 60.0 34.7 9.2%  60.0 - 80.0 333.5 88.6%  80.0 - 100.0 7.8 2.1%  100.0 - 120.0 - -  120.0 - 140.0 - -  140.0 - 200.0 - -   Titration Summary  PAP Device PAP Level O2 Level Time (min) Wake (min) NREM (min) REM (min) Sleep Eff% OA# CA# MA# Hyp# (>=3%) AHI (>=3%) Hyp# (>=4%) AHI (>=%4) RERA RDI SpO2 <=88% (min) Min SpO2 Mean SpO2 Ar. Index  CPAP 8 - 33.0 32.5 0.5 0.0 1.5% 1 - - - 120.0 -  120.0 -  120.0  0.0 92.0 92.7 120.0  CPAP 10 - 32.5 19.5 13.0 0.0 40.0% 10 - - 16 120.0 8  83.1 1  124.6  0.2 88.0 92.2 110.8  CPAP 12 - 14.5 0.0 14.5 0.0 100.0% - - - 14 57.9 8  33.1 4  74.5  3.6 87.0 90.1 70.3  CPAP 14 - 27.5 2.0 25.5 0.0 92.7% - - - 15 35.3 12  28.2 7  51.8  3.3 87.0 90.1 49.4  CPAP 16 - 25.0 0.0 25.0 0.0 100.0% - - - 7 16.8 4  9.6 2  21.6  1.8 88.0 89.7 21.6  CPAP 18 - 137.0 30.0 89.0 18.0 78.1% - - - 3 1.7 -  - 4  3.9  0.0 89.0 91.8 19.6  CPAP 19 - 113.0 4.5 59.0 49.5 96.0% 1 - - 1 1.1 -  0.6 2  2.2  0.1 87.0 92.2 13.8    Hypnograms                           Technologist Comments  Patient was ordered as a CPAP titration. Patient is a 63 year old white male who was sent to the sleep center for OSA. Patient was placed on CPAP of 8 cmh2o with 2 cmh2 of EPR at 10:39 pm and was increased to a CPAP  pressure of 19 cmh2o with 2 cmh2o of EPR. No audible snoring noted on final pressure setting. Patient was increased for respiratory events with and without a 4% desaturation or arousal, request for more air, and audible snoring. Patient tolerated CPAP trial fairly well. No oxygen was applied. No medications were reported taken. PLM's/PLMA's were noted. Questionable cardiac arrhythmias were noted see attached sheets and epochs for examples: 22, 208, 239, 402, 447, 478, 484, 495, 534, 667, etc. One restroom visit was noted. Patient was fitted with a [employer] Simplus nasal/oral full-face mask size large with heated humidification.  -Tech                         Adron Geisel Diplomate, Biomedical engineer of Sleep Medicine  ELECTRONICALLY SIGNED ON:  12/23/2023, 11:44 AM [facility] PH: [phone]   FX: [phone] ACCREDITED BY THE AMERICAN ACADEMY OF SLEEP MEDICINE

## 2023-12-25 ENCOUNTER — Ambulatory Visit (INDEPENDENT_AMBULATORY_CARE_PROVIDER_SITE_OTHER): Payer: Medicaid Other | Admitting: Podiatry

## 2023-12-25 ENCOUNTER — Encounter: Payer: Self-pay | Admitting: Podiatry

## 2023-12-25 DIAGNOSIS — M7751 Other enthesopathy of right foot: Secondary | ICD-10-CM | POA: Diagnosis not present

## 2023-12-25 DIAGNOSIS — M79674 Pain in right toe(s): Secondary | ICD-10-CM

## 2023-12-25 DIAGNOSIS — B351 Tinea unguium: Secondary | ICD-10-CM | POA: Diagnosis not present

## 2023-12-25 DIAGNOSIS — M79675 Pain in left toe(s): Secondary | ICD-10-CM | POA: Diagnosis not present

## 2023-12-25 NOTE — Progress Notes (Signed)
 Reviewed and agree with Dr Macky Lower plan.

## 2023-12-25 NOTE — Progress Notes (Unsigned)
 Subjective:  Patient ID: [name] [name], male    DOB: 01-Nov-1960,  MRN: [id number]  [name] [name] presents to clinic today for:  Chief Complaint  Patient presents with   [facility]    " The doctor does something like a trim with my nail every 3 months"   Patient notes nails are thick, discolored, elongated and painful in shoegear when trying to ambulate.  Patient also notes that he has been having pain on the dorsal aspect of the right forefoot and he points near the 2nd and 3rd metatarsophalangeal joints.  Denies trauma or bruising.  Aggravated with weightbearing.  PCP is Westley Chandler, MD.  Past Medical History:  Diagnosis Date   Alcoholism [facility])    Anxiety    Arthritis    "knees"   Asthma    CAD S/P percutaneous coronary angioplasty 01/18/12; 10/2013   a. pRCA 3.5 x 18 vision BMS - 4.2 mm; b. 2/'15: mCx 3.5 x 12  Rebel BMS (3.6-3.7 mm)   Chronic back pain    "mid/lower" (08/01/2014)   Colon polyps 02/18/2020   May 2021 - 11 polyps that I removed during your recent procedure were proven to be adenomatous. Fortunately all of these polyps were extremely small in size.  These are considered to be pre-cancerous polyps that may have grown into cancers if they had not been removed.  Studies show that at least 20% of women over age 11 and 30% of men over age 64 have pre-cancerous polyps. Based on current nation   COPD (chronic obstructive pulmonary disease) (HCC)    "I'm seeing COPD dr now; don't know if I've got it" (08/01/2014)   Depression    Dyspnea    GERD (gastroesophageal reflux disease)    Hyperlipidemia    Hypertension    Migraines    "once in awhile" (08/01/2014); no current problems as of 09/18/23 per pt.   Non-ST elevation myocardial infarction (NSTEMI) (HCC) 10/2013   OSA on CPAP 01/19/2012   Pneumonia    Sleep apnea    uses CPAP nightly   Type II diabetes mellitus [facility])     Past Surgical History:  Procedure Laterality Date   CARDIAC CATHETERIZATION  07/2014   Left Main:  Short, large-caliber vessel. Widely patent. Bifurcates into the LAD and Circumflex. Angiographically normal.   CORONARY ANGIOPLASTY WITH STENT PLACEMENT  01/2012; 11/07/2013; 08/01/2014   "1 + 2 + 1"    CORONARY ANGIOPLASTY WITH STENT PLACEMENT  07/2014   Severe single-vessel disease involving the bifurcation of OM1 and OM 2 with 99% in-stent restenosis in the bare-metal stent placed in the OM 2.   LEFT HEART CATHETERIZATION WITH CORONARY ANGIOGRAM N/A 01/20/2012   Procedure: LEFT HEART CATHETERIZATION WITH CORONARY ANGIOGRAM;  Surgeon: Corky Crafts, MD;  Location: [facility] CATH LAB;  Service: Cardiovascular;  Laterality: N/A;   LEFT HEART CATHETERIZATION WITH CORONARY ANGIOGRAM N/A 11/07/2013   Procedure: LEFT HEART CATHETERIZATION WITH CORONARY ANGIOGRAM;  Surgeon: Corky Crafts, MD;  Location[facility] CATH LAB;  Service: Cardiovascular;  Laterality: N/A;   LEFT HEART CATHETERIZATION WITH CORONARY ANGIOGRAM N/A 08/01/2014   Procedure: LEFT HEART CATHETERIZATION WITH CORONARY ANGIOGRAM;  Surgeon: Marykay Lex, MD;  Location: [facility] CATH LAB;  Service: Cardiovascular;  Laterality: N/A;   MULTIPLE TOOTH EXTRACTIONS     PERCUTANEOUS CORONARY STENT INTERVENTION (PCI-S)  01/20/2012   Procedure: PERCUTANEOUS CORONARY STENT INTERVENTION (PCI-S);  Surgeon: Corky Crafts, MD;  Location: [facility] CATH LAB;  Service: Cardiovascular;;  PERCUTANEOUS CORONARY STENT INTERVENTION (PCI-S)  11/07/2013   Procedure: PERCUTANEOUS CORONARY STENT INTERVENTION (PCI-S);  Surgeon: Lucendia Rusk, MD;  Location: [facility] CATH LAB;  Service: Cardiovascular;;   TYMPANOMASTOIDECTOMY Left 11/16/2023   Procedure: LEFT OPEN CAVITY TYMPANOMASTOIDECTOMY;  Surgeon: Virgina Grills, MD;  [facility] OR;  Service: ENT;  Laterality: Left;    Allergies  Allergen Reactions   Shellfish Allergy Nausea And Vomiting    OYSTERS   Yellow Dye Other (See Comments)    Gi intolerance     Review of Systems: Negative except as noted in the  HPI.  Objective:  [name] [name] is a pleasant 63 y.o. male in NAD. AAO x 3.  Vascular Examination: Capillary refill time is 3-5 seconds to toes bilateral. Palpable pedal pulses b/l LE. Digital hair present b/l.  Skin temperature gradient WNL b/l. No varicosities b/l. No cyanosis noted b/l.   Dermatological Examination: Pedal skin with normal turgor, texture and tone b/l. No open wounds. No interdigital macerations b/l. Toenails x10 are 3mm thick, discolored, dystrophic with subungual debris. There is pain with compression of the nail plates.  They are elongated x10  Musculoskeletal Examination: Muscle strength 5/5 to all LE muscle groups b/l.  Pain on palpation dorsal aspect right 2nd and 3rd MPJ.  No pain with range of motion of the metatarsophalangeal joints.  No crepitus noted with range of motion.  No ecchymosis or erythema noted.     Latest Ref Rng & Units 11/13/2023   11:30 AM 07/19/2023   11:23 AM 04/11/2023    7:45 AM 02/14/2023   11:33 AM  Hemoglobin A1C  Hemoglobin-A1c 4.8 - 5.6 % 8.6  7.9  8.2  8.1    Assessment/Plan: 1. Pain due to onychomycosis of toenails of both feet   2. Capsulitis of ankle, right    The mycotic toenails were sharply debrided x10 with sterile nail nippers and a power debriding burr to decrease bulk/thickness and length.    Recommended the patient purchase over-the-counter Voltaren gel to apply twice daily to the right 2nd and 3rd metatarsal phalangeal joint area and massage and well.  He can continue with this as needed for pain.  Also, encouraged patient to improve his A1c level as this may also exacerbate any type of neuropathy symptoms and make the feet hurt more overall.  Return in about 3 months (around 03/25/2024) for RFC.   [name], DPM, FACFAS [facility]     [address] [place], [place] [address]                Office [phone]  Fax [phone]

## 2024-01-12 ENCOUNTER — Ambulatory Visit (INDEPENDENT_AMBULATORY_CARE_PROVIDER_SITE_OTHER): Payer: Medicaid Other | Admitting: Gastroenterology

## 2024-01-12 ENCOUNTER — Other Ambulatory Visit: Payer: Self-pay

## 2024-01-12 ENCOUNTER — Telehealth: Payer: Self-pay

## 2024-01-12 ENCOUNTER — Encounter: Payer: Self-pay | Admitting: Gastroenterology

## 2024-01-12 VITALS — BP 108/60 | HR 66 | Ht 67.0 in | Wt 275.0 lb

## 2024-01-12 DIAGNOSIS — Z860100 Personal history of colon polyps, unspecified: Secondary | ICD-10-CM

## 2024-01-12 DIAGNOSIS — Z7902 Long term (current) use of antithrombotics/antiplatelets: Secondary | ICD-10-CM | POA: Diagnosis not present

## 2024-01-12 MED ORDER — NA SULFATE-K SULFATE-MG SULF 17.5-3.13-1.6 GM/177ML PO SOLN: 1.0000 | Freq: Once | ORAL | 0 refills | Status: AC

## 2024-01-12 NOTE — Telephone Encounter (Signed)
 [facility] Pre-operative Risk Assessment     Request for surgical clearance:     Endoscopy Procedure  What type of surgery is being performed?     COLONOSCOPY  When is this surgery scheduled?     02-27-24  What type of clearance is required ?   Pharmacy  Are there any medications that need to be held prior to surgery and how long? EFFIENT  5 DAYS  Practice name and name of physician performing surgery?    DR. Luella [facility]  What is your office phone and fax number?      Phone- [phone]  Fax- [phone]  Anesthesia type (None, local, MAC, general) ?       MAC   Please route your response to [facility], CMA

## 2024-01-12 NOTE — Patient Instructions (Addendum)
 You have been scheduled for a colonoscopy. Please follow written instructions given to you at your visit today.   If you use inhalers (even only as needed), please bring them with you on the day of your procedure.  DO NOT TAKE 7 DAYS PRIOR TO TEST- Trulicity (dulaglutide) Ozempic , Wegovy (semaglutide ) Mounjaro (tirzepatide) Bydureon Bcise (exanatide extended release)  DO NOT TAKE 1 DAY PRIOR TO YOUR TEST Rybelsus (semaglutide ) Adlyxin (lixisenatide) Victoza  (liraglutide ) Byetta (exanatide) ___________________________________________________________________________  Elene Griffes will be contacted by our office prior to your procedure for directions on holding your EFFIENT .  If you do not hear from our office 1 week prior to your scheduled procedure, please call [phone] to discuss.   Thank you for entrusting me with your care and for choosing [facility], Dr. Alvester Johnson    If your blood pressure at your visit was 140/90 or greater, please contact your primary care physician to follow up on this. ______________________________________________________  If you are age 40 or older, your body mass index should be between 23-30. Your Body mass index is 43.07 kg/m. If this is out of the aforementioned range listed, please consider follow up with your Primary Care Provider.  If you are age 43 or younger, your body mass index should be between 19-25. Your Body mass index is 43.07 kg/m. If this is out of the aformentioned range listed, please consider follow up with your Primary Care Provider.  ________________________________________________________  The [facility] providers would like to encourage you to use MYCHART to communicate with providers for non-urgent requests or questions.  Due to long hold times on the telephone, sending your provider a message by [facility] may be a faster and more efficient way to get a response.  Please allow 48 business hours for a response.  Please  remember that this is for non-urgent requests.  _______________________________________________________  Due to recent changes in healthcare laws, you may see the results of your imaging and laboratory studies on MyChart before your provider has had a chance to review them.  We understand that in some cases there may be results that are confusing or concerning to you. Not all laboratory results come back in the same time frame and the provider may be waiting for multiple results in order to interpret others.  Please give us  48 hours in order for your provider to thoroughly review all the results before contacting the office for clarification of your results.

## 2024-01-12 NOTE — Progress Notes (Signed)
 HPI :  63 year old male with a history of CAD, history of coronary stents in place, on Effient , obesity on Ozempic , here for a follow up visit to discuss surveillance colonoscopy.   Recall in May 2021 he had a colonoscopy showing 11 small adenomas removed at that time.  I had recommended a follow-up colonoscopy in 1 year. Colonoscopy 02/2021 showed two adenomas. I had recommended a repeat exam in 3 years given the burden of polyps he had back in 2021.  He reports has been doing pretty well since of last seen him.  He denies any problems with his bowels whatsoever.  Denies any blood in his stools.  No chronic abdominal pains.  He continues to take Effient  for CAD.  Denies any cardiac symptoms that are bothering him since have last seen him.  He has had some dyspnea in the past, he states they thought this was due to his CPAP, he has had that adjusted and states he is doing much better in that regard.  He does not take any supplemental oxygen.  Denies shortness of breath today.  He is also on Ozempic , takes this 1 weekly to help with his weight, states it has been working so far and has not altered his bowel habits.  He does wish to proceed with colonoscopy after discussion of this today.   Prior workup: Colonoscopy 01/29/20 - The perianal and digital rectal examinations were normal. - Two sessile polyps were found in the cecum. The polyps were 3 mm in size. These polyps were removed with a cold snare. Resection and retrieval were complete. - A 3 mm polyp was found in the ascending colon. The polyp was sessile. The polyp was removed with a cold snare. Resection and retrieval were complete. - Two sessile polyps were found in the hepatic flexure. The polyps were 2 to 3 mm in size. These polyps were removed with a cold snare. Resection and retrieval were complete. - Three sessile polyps were found in the transverse colon. The polyps were 3 to 5 mm in size. These polyps were removed with a cold snare.  Resection and retrieval were complete. - Two sessile polyps were found in the descending colon. The polyps were 2 to 3 mm in size. These polyps were removed with a cold snare. Resection and retrieval were complete. - A 3 mm polyp was found in the rectum. The polyp was sessile. The polyp was removed with a cold snare. Resection and retrieval were complete. - The exam was otherwise without abnormality.   Surgical [P], colon, cecum, ascending, hepatic flexure, transverse, descending, rectosigmoid, polyp (10) - MULTIPLE FRAGMENTS OF TUBULAR ADENOMA. - NO HIGH GRADE DYSPLASIA OR MALIGNANCY       Colonoscopy 02/25/21: - The perianal and digital rectal examinations were normal other than some perianal irritation. - A diminutive polyp was found in the transverse colon. The polyp was sessile. The polyp was removed with a cold snare. Resection and retrieval were complete. - A 3 to 4 mm polyp was found in the sigmoid colon. The polyp was sessile. The polyp was removed with a cold snare. Resection and retrieval were complete. - Internal hemorrhoids were found during retroflexion. - The exam was otherwise without abnormality.  Surgical [P], colon, transverse, sigmoid, polyp (2) - TUBULAR ADENOMA (X2). - NEGATIVE FOR HIGH GRADE DYSPLASIA.    Echo 05/10/21 IMPRESSIONS     1. Left ventricular ejection fraction, by estimation, is 60 to 65%. The  left ventricle has normal function. The  left ventricle has no regional  wall motion abnormalities. There is mild left ventricular hypertrophy.  Left ventricular diastolic parameters  are consistent with Grade I diastolic dysfunction (impaired relaxation).   2. Right ventricular systolic function is normal. The right ventricular  size is normal. Tricuspid regurgitation signal is inadequate for assessing  PA pressure.   3. The mitral valve is normal in structure. No evidence of mitral valve  regurgitation. No evidence of mitral stenosis.   4. The aortic valve  is tricuspid. Aortic valve regurgitation is not  visualized. Mild aortic valve sclerosis is present, with no evidence of  aortic valve stenosis.   5. The inferior vena cava is normal in size with <50% respiratory  variability, suggesting right atrial pressure of 8 mmHg.        Past Medical History:  Diagnosis Date   Alcoholism (HCC)    Anxiety    Arthritis    "knees"   Asthma    CAD S/P percutaneous coronary angioplasty 01/18/12; 10/2013   a. pRCA 3.5 x 18 vision BMS - 4.2 mm; b. 2/'15: mCx 3.5 x 12  Rebel BMS (3.6-3.7 mm)   Chronic back pain    "mid/lower" (08/01/2014)   Colon polyps 02/18/2020   May 2021 - 11 polyps that I removed during your recent procedure were proven to be adenomatous. Fortunately all of these polyps were extremely small in size.  These are considered to be pre-cancerous polyps that may have grown into cancers if they had not been removed.  Studies show that at least 20% of women over age 53 and 30% of men over age 46 have pre-cancerous polyps. Based on current [place]   COPD (chronic obstructive pulmonary disease) (HCC)    "I'm seeing COPD dr now; don't know if I've got it" (08/01/2014)   Depression    Dyspnea    GERD (gastroesophageal reflux disease)    Hyperlipidemia    Hypertension    Migraines    "once in awhile" (08/01/2014); no current problems as of 09/18/23 per pt.   Non-ST elevation myocardial infarction (NSTEMI) (HCC) 10/2013   OSA on CPAP 01/19/2012   Pneumonia    Sleep apnea    uses CPAP nightly   Type II diabetes mellitus [facility])      Past Surgical History:  Procedure Laterality Date   CARDIAC CATHETERIZATION  07/2014   Left Main: Short, large-caliber vessel. Widely patent. Bifurcates into the LAD and Circumflex. Angiographically normal.   CORONARY ANGIOPLASTY WITH STENT PLACEMENT  01/2012; 11/07/2013; 08/01/2014   "1 + 2 + 1"    CORONARY ANGIOPLASTY WITH STENT PLACEMENT  07/2014   Severe single-vessel disease involving the bifurcation of  OM1 and OM 2 with 99% in-stent restenosis in the bare-metal stent placed in the OM 2.   LEFT HEART CATHETERIZATION WITH CORONARY ANGIOGRAM N/A 01/20/2012   Procedure: LEFT HEART CATHETERIZATION WITH CORONARY ANGIOGRAM;  Surgeon: Lucendia Rusk, MD;  Location: [facility] CATH LAB;  Service: Cardiovascular;  Laterality: N/A;   LEFT HEART CATHETERIZATION WITH CORONARY ANGIOGRAM N/A 11/07/2013   Procedure: LEFT HEART CATHETERIZATION WITH CORONARY ANGIOGRAM;  Surgeon: Lucendia Rusk, MD;  Location: [facility] CATH LAB;  Service: Cardiovascular;  Laterality: N/A;   LEFT HEART CATHETERIZATION WITH CORONARY ANGIOGRAM N/A 08/01/2014   Procedure: LEFT HEART CATHETERIZATION WITH CORONARY ANGIOGRAM;  Surgeon: Arleen Lacer, MD;  Location: [facility] CATH LAB;  Service: Cardiovascular;  Laterality: N/A;   MULTIPLE TOOTH EXTRACTIONS     PERCUTANEOUS CORONARY STENT INTERVENTION (PCI-S)  01/20/2012  Procedure: PERCUTANEOUS CORONARY STENT INTERVENTION (PCI-S);  Surgeon: Lucendia Rusk, MD;  Location: [facility] CATH LAB;  Service: Cardiovascular;;   PERCUTANEOUS CORONARY STENT INTERVENTION (PCI-S)  11/07/2013   Procedure: PERCUTANEOUS CORONARY STENT INTERVENTION (PCI-S);  Surgeon: Lucendia Rusk, MD;  Location: [facility] CATH LAB;  Service: Cardiovascular;;   TYMPANOMASTOIDECTOMY Left 11/16/2023   Procedure: LEFT OPEN CAVITY TYMPANOMASTOIDECTOMY;  Surgeon: Virgina Grills, MD;  Location: [facility] OR;  Service: ENT;  Laterality: Left;   Family History  Problem Relation Age of Onset   Breast cancer Mother    Heart attack Father 36   Hypertension Sister    Hyperlipidemia Sister    Hyperlipidemia Sister    Hypertension Sister    Diabetes Sister    Heart attack Brother    Stroke Brother    Diabetes Brother    Colon cancer Neg Hx    Esophageal cancer Neg Hx    Rectal cancer Neg Hx    Stomach cancer Neg Hx    Social History   Tobacco Use   Smoking status: Former    Current packs/day: 0.00    Average packs/day: 1 pack/day for 42.4  years (42.4 ttl pk-yrs)    Types: Cigarettes    Start date: 09/13/1975    Quit date: 01/25/2018    Years since quitting: 5.9   Smokeless tobacco: Never   Tobacco comments:    Bupropion  helping  Vaping Use   Vaping status: Never Used  Substance Use Topics   Alcohol use: Not Currently    Comment: quit 3 yrs ago in 12/2020 per pt.   Drug use: No    Comment: hx Crack Cocaine ( 05/09/16)   Current Outpatient Medications  Medication Sig Dispense Refill   albuterol  (VENTOLIN  HFA) 108 (90 Base) MCG/ACT inhaler Inhale 1 puff into the lungs every 6 (six) hours as needed for wheezing or shortness of breath.     aspirin  81 MG chewable tablet Chew 1 tablet (81 mg total) by mouth daily. 180 tablet 2   atorvastatin  (LIPITOR ) 80 MG tablet TAKE 1 TABLET BY MOUTH DAILY (Patient taking differently: Take 80 mg by mouth at bedtime.) 90 tablet 3   Blood Pressure Monitoring (BLOOD PRESSURE CUFF) MISC 1 kit by Does not apply route 2 (two) times a week. 1 each 0   carvedilol  (COREG ) 3.125 MG tablet TAKE 1 TABLET BY MOUTH TWICE A DAY WITH A MEAL 180 tablet 3   citalopram  (CELEXA ) 20 MG tablet Take 1 tablet (20 mg total) by mouth daily. 90 tablet 1   Continuous Glucose Sensor (FREESTYLE LIBRE 2 SENSOR) MISC APPLY SENSOR TO SKIN FOR CONTINUOUS BLOOD SUGAR MONITORING, REPLACE EVERY 14 DAYS 6 each 3   glucose blood (ACCU-CHEK GUIDE) test strip Use as instructed 100 each 12   insulin  glargine (LANTUS  SOLOSTAR) 100 UNIT/ML Solostar Pen Inject 12 Units into the skin daily.     Insulin  Pen Needle (DROPLET PEN NEEDLES) 29G X MISC Use to inject insulin  daily 100 each 1   Insulin  Pen Needle (PEN NEEDLES) 32G X 4 MM MISC Use as directed with insulin  four times daily 100 each 12   JARDIANCE  25 MG TABS tablet TAKE 1 TABLET BY MOUTH DAILY 90 tablet 3   lisinopril  (ZESTRIL ) 2.5 MG tablet TAKE 1 TABLET BY MOUTH TWICE A DAY 180 tablet 3   Melatonin 2.5 MG CAPS Take 5 mg by mouth at bedtime.     metFORMIN  (GLUCOPHAGE ) 1000 MG  tablet TAKE ONE TABLET BY MOUTH TWICE A  DAY WITH MEALS 180 tablet 3   mometasone -formoterol  (DULERA ) 200-5 MCG/ACT AERO Inhale 2 puffs into the lungs in the morning and at bedtime. (Patient taking differently: Inhale 2-3 puffs into the lungs in the morning and at bedtime.) 39 g 0   Multiple Vitamin (MULTIVITAMIN) tablet Take 1 tablet by mouth daily.     nitroGLYCERIN  (NITROSTAT ) 0.4 MG SL tablet DISSOLVE 1 TABLET UNDER THE TONGUE FOR CHEST PAIN. IF PAIN REMAINS AFTER 5 MINUTES, CALL 911 AND REPEAT DOSE. MAX 3 TABLETS IN 15 MINUTES 100 tablet 0   NON FORMULARY Pt uses a c-pap nightly     Omega-3 Fatty Acids  (FISH OIL ) 1000 MG CAPS Take 1 capsule (1,000 mg total) by mouth daily.     omeprazole  (PRILOSEC) 20 MG capsule Take 1 capsule (20 mg total) by mouth 2 (two) times daily before a meal. 60 capsule 5   prasugrel  (EFFIENT ) 10 MG TABS tablet Take 1 tablet (10 mg total) by mouth daily. 90 tablet 2   Semaglutide , 2 MG/DOSE, (OZEMPIC , 2 MG/DOSE,) 8 MG/3ML SOPN Inject 2 mg into the skin once a week. Monday 9 mL 2   traMADol  (ULTRAM ) 50 MG tablet Take 50 mg by mouth every 12 (twelve) hours as needed for moderate pain (pain score 4-6).     No current facility-administered medications for this visit.   Allergies  Allergen Reactions   Shellfish Allergy Nausea And Vomiting    OYSTERS   Yellow Dye Other (See Comments)    Gi intolerance      Review of Systems: All systems reviewed and negative except where noted in HPI.   Lab Results  Component Value Date   WBC 10.2 11/13/2023   HGB 14.8 11/13/2023   HCT 44.4 11/13/2023   MCV 88.3 11/13/2023   PLT 288 11/13/2023    Lab Results  Component Value Date   NA 140 11/13/2023   CL 102 11/13/2023   K 4.3 11/13/2023   CO2 22 11/13/2023   BUN 15 11/13/2023   CREATININE 0.66 11/13/2023   GFRNONAA >60 11/13/2023   CALCIUM  9.3 11/13/2023   PHOS 4.5 04/11/2023   ALBUMIN 3.8 04/12/2023   GLUCOSE 197 (H) 11/13/2023    Lab Results  Component Value  Date   ALT 17 04/12/2023   AST 18 04/12/2023   ALKPHOS 84 04/12/2023   BILITOT 0.7 04/12/2023      Physical Exam: BP 108/60   Pulse 66   Ht 5\' 7"  (1.702 m)   Wt 275 lb (124.7 kg)   BMI 43.07 kg/m  Constitutional: Pleasant,well-developed, male in no acute distress. Neurological: Alert and oriented to person place and time. Psychiatric: Normal mood and affect. Behavior is normal.   ASSESSMENT: 63 y.o. male here for assessment of the following  1. History of colon polyps   2. Antiplatelet or antithrombotic long-term use    Numerous colon polyps back in 2021, due for surveillance colonoscopy at this time.  He has no bowel symptoms that bother him, doing well in that perspective.  He does have a history of CAD on Effient  and obesity on Ozempic .  I discussed risk benefits of colonoscopy and anesthesia with him.  He seems stable at this time from cardiopulmonary perspective to proceed.  We will reach out to his cardiologist to see if we can hold Effient  for 5 days prior to his exam, and also asked that he hold his Ozempic  for 1 week prior to the colonoscopy (reduce risk for aspiration under anesthesia).  Following  discussion of these issues he wants to proceed.  Further recommendations pending results.  Christi Coward, MD [facility]

## 2024-01-15 NOTE — Telephone Encounter (Signed)
 Called and spoke to patient.  He got call from Cardiology this morning. He understands that June 11th will be the last day he will take Effient  prior to procedure on 6-17

## 2024-01-15 NOTE — Telephone Encounter (Signed)
   Primary Cardiologist: Peter Swaziland, MD  Chart reviewed as part of pre-operative protocol coverage. Given past medical history and time since last visit, based on ACC/AHA guidelines, [name] [name] would be at acceptable risk for the planned procedure without further cardiovascular testing.   Called patient to ensure no concerning cardiac symptoms with exertion.  Patient was advised that if he develops new symptoms prior to surgery to contact our office to arrange a follow-up appointment.  He verbalized understanding.  I will route this recommendation to the requesting party via Epic fax function and remove from pre-op pool.  Please call with questions.  Gerldine Koch, NP-C 01/15/2024, 8:49 AM [address], [address] [place], [place] [address] Office [phone] Fax [phone]

## 2024-01-16 ENCOUNTER — Other Ambulatory Visit (HOSPITAL_COMMUNITY): Payer: Self-pay

## 2024-01-16 ENCOUNTER — Telehealth: Payer: Self-pay

## 2024-01-16 ENCOUNTER — Other Ambulatory Visit: Payer: Self-pay | Admitting: Family Medicine

## 2024-01-16 ENCOUNTER — Telehealth: Payer: Self-pay | Admitting: Nurse Practitioner

## 2024-01-16 DIAGNOSIS — G4733 Obstructive sleep apnea (adult) (pediatric): Secondary | ICD-10-CM

## 2024-01-16 DIAGNOSIS — E1165 Type 2 diabetes mellitus with hyperglycemia: Secondary | ICD-10-CM

## 2024-01-16 NOTE — Telephone Encounter (Signed)
 [name] Clarke, NP to Me      01/16/24  9:47 AM Well controlled on CPAP 19 cmH2O. I don't think his machine remote transmits anymore so he would need to take it to the DME to have pressures adjusted. Please place order for CPAP 19 cmH2O and advise him to please bring his machine to his next appt so we can check settings/usage. Thanks!  I called and spoke with the pt and notified of results/recs per [facility]. He verbalized understanding. Order for new CPAP was placed. Nothing further needed.

## 2024-01-16 NOTE — Telephone Encounter (Signed)
 Patient calls nurse line in regards to Jardiance .   He reports he went to refill the medication, however was told this will need a PA.   Will forward to pharmacy team.

## 2024-01-18 NOTE — Telephone Encounter (Signed)
 Pharmacy Patient Advocate Encounter   Received notification from CoverMyMeds that prior authorization for JARDIANCE  is required/requested.   Insurance verification completed.   The patient is insured through [facility] MEDICAID .   PA required; PA submitted to above mentioned insurance via CoverMyMeds Key/confirmation #/EOC [id number]. Status is pending

## 2024-01-18 NOTE — Telephone Encounter (Signed)
 Pharmacy Patient Advocate Encounter  Received notification from [facility] MEDICAID that Prior Authorization for JARDIANCE  25MG  has been APPROVED from 01/18/24 to 01/17/25   PA #/Case ID/Reference #: [id number]

## 2024-01-19 ENCOUNTER — Other Ambulatory Visit: Payer: Self-pay

## 2024-01-19 MED ORDER — FREESTYLE LIBRE 2 READER DEVI: 0 refills | Status: DC

## 2024-01-19 NOTE — Telephone Encounter (Signed)
 Patient calls nurse line to check status of PA for Jardiance . Advised of approval.   Patient reports that he is also needing Freestyle receiver.   Pended to this encounter.  Elsie Halo, RN

## 2024-01-25 ENCOUNTER — Other Ambulatory Visit: Payer: Self-pay | Admitting: Family Medicine

## 2024-01-25 DIAGNOSIS — E1159 Type 2 diabetes mellitus with other circulatory complications: Secondary | ICD-10-CM

## 2024-02-07 ENCOUNTER — Ambulatory Visit: Attending: Family Medicine

## 2024-02-07 ENCOUNTER — Ambulatory Visit: Admitting: Family Medicine

## 2024-02-07 ENCOUNTER — Encounter: Payer: Self-pay | Admitting: Family Medicine

## 2024-02-07 VITALS — BP 121/55 | HR 65 | Ht 67.0 in | Wt 278.6 lb

## 2024-02-07 DIAGNOSIS — Z9189 Other specified personal risk factors, not elsewhere classified: Secondary | ICD-10-CM | POA: Diagnosis not present

## 2024-02-07 DIAGNOSIS — R21 Rash and other nonspecific skin eruption: Secondary | ICD-10-CM

## 2024-02-07 DIAGNOSIS — E119 Type 2 diabetes mellitus without complications: Secondary | ICD-10-CM | POA: Diagnosis not present

## 2024-02-07 DIAGNOSIS — Z794 Long term (current) use of insulin: Secondary | ICD-10-CM | POA: Diagnosis not present

## 2024-02-07 DIAGNOSIS — E1165 Type 2 diabetes mellitus with hyperglycemia: Secondary | ICD-10-CM

## 2024-02-07 MED ORDER — HYDROCORTISONE 1 % EX OINT: 1.0000 | TOPICAL_OINTMENT | Freq: Two times a day (BID) | CUTANEOUS | 0 refills | Status: DC

## 2024-02-07 MED ORDER — FREESTYLE LIBRE 2 SENSOR MISC: 1 refills | Status: DC

## 2024-02-07 NOTE — Patient Instructions (Addendum)
 It was great to see you! Thank you for allowing me to participate in your care!  Our plans for today:  - You have an appointment with Dr. Bevin Bucks on June 30th at 8:50am.  - Please continue taking your diabetes medicine.  - You should receive the heart monitor in the mail. This will have directions on how to place on your body. - If you have trouble please bring it in at your next visit.   Please arrive 15 minutes PRIOR to your next scheduled appointment time! If you do not, this affects OTHER patients' care.  Take care and seek immediate care sooner if you develop any concerns.   Ivin Marrow, MD, PGY-2 [facility] 10:29 AM 02/07/2024  [facility]

## 2024-02-07 NOTE — Assessment & Plan Note (Signed)
 Unfortunately notable to get A1c today as it will not be covered by insurance until June 3.  Continue current medications listed above.  Follow-up 2 weeks for A1c check.

## 2024-02-07 NOTE — Addendum Note (Signed)
 Addended by: Ivin Marrow on: 02/07/2024 12:10 PM   Modules accepted: Orders

## 2024-02-07 NOTE — Progress Notes (Unsigned)
 EP to read.

## 2024-02-07 NOTE — Progress Notes (Addendum)
    SUBJECTIVE:   CHIEF COMPLAINT / HPI: diabetes f/u and rash  T2DM Taking metformin  1000 mg twice daily Taking Jardiance  25 mg daily Taking 12 units Lantus  daily Taking Ozempic  2 mg weekly  Rash Start last week Itchy Maybe started after people mowed the lawn Worsening gradually. Has not tried anything  Sleep study Showed concern for cardiac arrhythmia Denies palpitations or chest pain   PERTINENT  PMH / PSH: HTN, CAD, OSA, GERD, T2DM   OBJECTIVE:   BP (!) 121/55   Pulse 65   Ht 5\' 7"  (1.702 m)   Wt 278 lb 9.6 oz (126.4 kg)   SpO2 98%   BMI 43.63 kg/m   General: NAD, well appearing Neuro: A&O Respiratory: normal WOB on RA Extremities: Moving all 4 extremities equally, red scattered papules across bilateral lower extremities, warm well perfused   ASSESSMENT/PLAN:   Assessment & Plan Type 2 diabetes mellitus without complication, with long-term current use of insulin  (HCC) Unfortunately notable to get A1c today as it will not be covered by insurance until June 3.  Continue current medications listed above.  Follow-up 2 weeks for A1c check. Rash Chigger versus scabies based on clinical appearance.  Given exposure to grass we will treat as chigger bites.  Plan for hydrocortisone  cream 1% twice daily until rash resolves.  Zyrtec daily as needed as needed.  Follow-up 2 weeks.  If not resolving will try permethrin . At risk for arrhythmia Questionable cardiac arrhythmias noted on patient sleep study.  Discussed with patient who is reassuringly asymptomatic with stable vitals today.  Zio patch ordered.  Patient agreeable to plan.  Patient scheduled for follow-up with PCP Dr. Bevin Bucks for June 30.  Return in about 2 weeks (around 02/21/2024).  Ivin Marrow, MD [facility]   ADDENDUM: Further discussed patient with Dr. Drue Gerald.  He would likely benefit from review of EKG given sleep study findings.  Called patient to schedule appointment for tomorrow.   Patient is agreeable appointment tomorrow with Dr. Becki Bouton at 215pm, which I have scheduled.  Return precautions discussed for any chest pain or palpitations.

## 2024-02-08 ENCOUNTER — Encounter: Payer: Self-pay | Admitting: Family Medicine

## 2024-02-08 ENCOUNTER — Ambulatory Visit (HOSPITAL_COMMUNITY)
Admission: RE | Admit: 2024-02-08 | Discharge: 2024-02-08 | Disposition: A | Discharge status: Home / Self Care | Source: Ambulatory Visit | Attending: Family Medicine | Admitting: Family Medicine

## 2024-02-08 ENCOUNTER — Ambulatory Visit (INDEPENDENT_AMBULATORY_CARE_PROVIDER_SITE_OTHER): Payer: Self-pay | Admitting: Family Medicine

## 2024-02-08 ENCOUNTER — Other Ambulatory Visit: Payer: Self-pay

## 2024-02-08 VITALS — BP 121/62 | HR 73 | Ht 67.0 in | Wt 279.8 lb

## 2024-02-08 DIAGNOSIS — I493 Ventricular premature depolarization: Secondary | ICD-10-CM | POA: Diagnosis present

## 2024-02-08 DIAGNOSIS — I499 Cardiac arrhythmia, unspecified: Secondary | ICD-10-CM | POA: Diagnosis not present

## 2024-02-08 NOTE — Progress Notes (Signed)
    SUBJECTIVE:   CHIEF COMPLAINT / HPI:   Found to have frequent PVCs on recent sleep study Zio patch was ordered yesterday, presents today for EKG Asymptomatic.  Denies palpitations, chest pain, shortness of breath, feelings of skipped beats.  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  EKG completely normal today.  Heart exam normal.  Asymptomatic. Advised to let us  know if he does not receive Zio patch soon, this will likely give more information regarding his PVC burden Return precautions discussed    Sarahann Cumins, DO [facility]

## 2024-02-08 NOTE — Patient Instructions (Signed)
 Good to see you today - Thank you for coming in  Things we discussed today:  Your EKG was normal.  If you do not receive your Zio patch by the end of next week please give us  a call. You can apply Benadryl cream to your rash until you are able to pick up your prescription.  Please always bring your medication bottles  If you develop any chest pain, racing heartbeat, profuse sweating, shortness of breath please go to the emergency room

## 2024-02-12 ENCOUNTER — Ambulatory Visit (INDEPENDENT_AMBULATORY_CARE_PROVIDER_SITE_OTHER): Admitting: Student

## 2024-02-12 ENCOUNTER — Encounter: Payer: Self-pay | Admitting: Student

## 2024-02-12 VITALS — BP 127/44 | HR 72 | Ht 67.0 in | Wt 281.0 lb

## 2024-02-12 DIAGNOSIS — R21 Rash and other nonspecific skin eruption: Secondary | ICD-10-CM | POA: Diagnosis present

## 2024-02-12 DIAGNOSIS — I493 Ventricular premature depolarization: Secondary | ICD-10-CM

## 2024-02-12 MED ORDER — HYDROCORTISONE 1 % EX OINT: 1.0000 | TOPICAL_OINTMENT | Freq: Two times a day (BID) | CUTANEOUS | 0 refills | Status: DC

## 2024-02-12 NOTE — Progress Notes (Signed)
    SUBJECTIVE:   CHIEF COMPLAINT / HPI:   [name] [name] is a 63 year old male who presents for assistance with Zio patch placement following abnormal findings on a sleep study.  He has no palpitations or dizziness.  He has a non-pruritic rash on his legs for about a week, treated with hydrocortisone  cream and Zyrtec since his last visit on 02/07/24 d/t concern for chiggers. The rash has not improved and may have worsened. It may have been triggered by mowing the lawn. He is feeling well today and the rash is not bothering him other than the way it looks.   PERTINENT  PMH / PSH: CAD, HTN, OSA, T2DM  OBJECTIVE:   BP (!) 127/44   Pulse 72   Ht 5\' 7"  (1.702 m)   Wt 281 lb (127.5 kg)   SpO2 95%   BMI 44.01 kg/m    General: NAD, pleasant, well-appearing Cardiac: RRR, no murmurs. Respiratory: CTAB, normal effort, No wheezes, rales or rhonchi Skin: Scattered erythematous rash on bilateral legs and lower abdomen in different stages of healing, some scabbed over, some vesicular/pustular, some ulcerated in appearance.  No surrounding erythema of skin, warmth or drainage.  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 No symptoms of palpitations or dizziness. Zio patch applied for monitoring. Instructed on use and symptom logging.  - Schedule follow-up for Zio patch removal on June 16 per pt request - Mail back Zio patch after two weeks of use.  Rash Rash on legs, possibly extending to abdomen. Differential includes chigger bites and bedbugs though patient is adamant there is no evidence of bedbugs in his home stating he knows what to look for. No pruritus and has been using Zyrtec and hydrocortisone  as instructed.  Reassuringly, no systemic symptoms at this time concerning for a vasculitis or systemic infection. - Continue hydrocortisone  cream and cetirizine. - Inspect for  bedbugs and evidence on mattress. - Return if rash worsens, becomes painful, or systemic symptoms develop. - follow-up on June 11. - Refill hydrocortisone  cream prescription.     Dr. Glenn Lange, DO [facility]

## 2024-02-12 NOTE — Patient Instructions (Signed)
 It was great to see you! Thank you for allowing me to participate in your care!    Our plans for today:  - Continue taking zyrtec and using the hydrocortisone  cream - If you rash get a lot worse, you have drainage, swelling or feel unwell please return before your next scheduled appointment  - return in 2 weeks for zio patch removal. Bring the blue box with you   Take care and seek immediate care sooner if you develop any concerns.   Dr. Glenn Lange, DO [facility]

## 2024-02-13 DIAGNOSIS — I493 Ventricular premature depolarization: Secondary | ICD-10-CM | POA: Insufficient documentation

## 2024-02-13 DIAGNOSIS — L309 Dermatitis, unspecified: Secondary | ICD-10-CM | POA: Insufficient documentation

## 2024-02-13 DIAGNOSIS — R21 Rash and other nonspecific skin eruption: Secondary | ICD-10-CM | POA: Insufficient documentation

## 2024-02-13 NOTE — Assessment & Plan Note (Signed)
 Rash on legs, possibly extending to abdomen. Differential includes chigger bites and bedbugs though patient is adamant there is no evidence of bedbugs in his home stating he knows what to look for. No pruritus and has been using Zyrtec and hydrocortisone  as instructed.  Reassuringly, no systemic symptoms at this time concerning for a vasculitis or systemic infection. - Continue hydrocortisone  cream and cetirizine. - Inspect for bedbugs and evidence on mattress. - Return if rash worsens, becomes painful, or systemic symptoms develop. - follow-up on June 11. - Refill hydrocortisone  cream prescription.

## 2024-02-13 NOTE — Assessment & Plan Note (Signed)
 PVCs identified during sleep study. No symptoms of palpitations or dizziness. Zio patch applied for monitoring. Instructed on use and symptom logging.  - Schedule follow-up for Zio patch removal on June 16 per pt request - Mail back Zio patch after two weeks of use.

## 2024-02-13 NOTE — Assessment & Plan Note (Signed)
>>  ASSESSMENT AND PLAN FOR RASH WRITTEN ON 02/13/2024  3:43 PM BY Rochelle Chu, Pete Schnitzer, DO  Rash on legs, possibly extending to abdomen. Differential includes chigger bites and bedbugs though patient is adamant there is no evidence of bedbugs in his home stating he knows what to look for. No pruritus and has been using Zyrtec and hydrocortisone  as instructed.  Reassuringly, no systemic symptoms at this time concerning for a vasculitis or systemic infection. - Continue hydrocortisone  cream and cetirizine. - Inspect for bedbugs and evidence on mattress. - Return if rash worsens, becomes painful, or systemic symptoms develop. - follow-up on June 11. - Refill hydrocortisone  cream prescription.

## 2024-02-21 ENCOUNTER — Ambulatory Visit (INDEPENDENT_AMBULATORY_CARE_PROVIDER_SITE_OTHER): Admitting: Family Medicine

## 2024-02-21 ENCOUNTER — Encounter: Payer: Self-pay | Admitting: Family Medicine

## 2024-02-21 VITALS — BP 129/61 | HR 82 | Ht 67.0 in | Wt 275.4 lb

## 2024-02-21 DIAGNOSIS — E1165 Type 2 diabetes mellitus with hyperglycemia: Secondary | ICD-10-CM | POA: Diagnosis not present

## 2024-02-21 DIAGNOSIS — Z794 Long term (current) use of insulin: Secondary | ICD-10-CM

## 2024-02-21 DIAGNOSIS — R809 Proteinuria, unspecified: Secondary | ICD-10-CM | POA: Diagnosis not present

## 2024-02-21 DIAGNOSIS — L309 Dermatitis, unspecified: Secondary | ICD-10-CM

## 2024-02-21 LAB — POCT GLYCOSYLATED HEMOGLOBIN (HGB A1C): HbA1c, POC (controlled diabetic range): 8.4 % — AB (ref 0.0–7.0)

## 2024-02-21 MED ORDER — DOXYCYCLINE HYCLATE 100 MG PO TABS: 100.0000 mg | ORAL_TABLET | Freq: Two times a day (BID) | ORAL | 0 refills | Status: AC

## 2024-02-21 MED ORDER — LANTUS SOLOSTAR 100 UNIT/ML ~~LOC~~ SOPN: 20.0000 [IU] | PEN_INJECTOR | Freq: Every day | SUBCUTANEOUS | Status: DC

## 2024-02-21 MED ORDER — TRIAMCINOLONE ACETONIDE 0.1 % EX OINT: 1.0000 | TOPICAL_OINTMENT | Freq: Two times a day (BID) | CUTANEOUS | 3 refills | Status: DC

## 2024-02-21 NOTE — Assessment & Plan Note (Signed)
>  300 on ACR in 11/2023.  Discussed transitioning off lisinopril  2.5 mg twice daily regimen onto ARB.  Patient just picked up more lisinopril , would not like to waste medication due to financial reasons. -Repeat ACR in 2 months and consider switching to ARB therapy at that time

## 2024-02-21 NOTE — Progress Notes (Signed)
    SUBJECTIVE:   CHIEF COMPLAINT / HPI:   Rash Seen 02/12/2024 for similar concern.  Continued on hydrocortisone  ointment x 2 weeks and then cetirizine. Not itchy or painful.  Thought to be due to insect bites.  Does have some areas that are draining.  Would like to go swimming but has been avoiding it due to the rash.  No fevers or N/V.  Diabetes Current Regimen: Jardiance  25 mg daily, metformin  1000 mg twice daily, Ozempic  2 mg weekly, Lantus  20 units daily CBGs: Fasting 174 this AM.  Did not bring CGM with him.  Thinks he might of had some hypoglycemic events recently but unsure. Last A1c:  Lab Results  Component Value Date   HGBA1C 8.4 (A) 02/21/2024    Denies polyuria, polydipsia, hypoglycemia Last Eye Exam: UTD Statin: Atorvastatin  80 mg daily ACE/ARB: Lisinopril  2.5 mg twice daily  PERTINENT  PMH / PSH: HTN, CAD, OSA, GERD, T2DM, HLD  OBJECTIVE:   BP 129/61   Pulse 82   Ht 5' 7 (1.702 m)   Wt 275 lb 6.4 oz (124.9 kg)   SpO2 93%   BMI 43.13 kg/m    General: NAD, pleasant, able to participate in exam Cardiac: RRR, no murmurs. Respiratory: CTAB, normal effort, No wheezes, rales or rhonchi Extremities: no edema or cyanosis. Skin: Numerous areas on bilateral lower extremities with papules and central scabbing with surrounding erythema.  3-4 spots on left ankle with some purulent drainage noted.   ASSESSMENT/PLAN:   Assessment & Plan Dermatitis Likely secondary to insect bite, delayed healing likely secondary to excoriations.  Some purulent drainage noted from some of the lesions, will treat with oral antibiotic and stronger topical steroid. -Doxycycline  100 mg twice daily x 5 days -Triamcinolone  0.1% ointment twice daily until resolved Type 2 diabetes mellitus with hyperglycemia, with long-term current use of insulin  (HCC) A1c 8.4, mildly improved from 8.6 in 11/2023.  On Jardiance , metformin , Ozempic  max doses and Lantus  20 units daily.  Did not have CGM data  available, thinks he may have had some hypoglycemic events recently.  Encouraged patient to bring CGM data at follow-up appointment next week to discuss insulin  titration further. Microalbuminuria >300 on ACR in 11/2023.  Discussed transitioning off lisinopril  2.5 mg twice daily regimen onto ARB.  Patient just picked up more lisinopril , would not like to waste medication due to financial reasons. -Repeat ACR in 2 months and consider switching to ARB therapy at that time   Dr. Jonne Netters, DO [facility]

## 2024-02-21 NOTE — Patient Instructions (Addendum)
 It was wonderful to see you today! Thank you for choosing [facility].   Please bring ALL of your medications with you to every visit.   Today we talked about:  For your rash I would like you to take an oral antibiotic call Doxycyline twice per day for the next 5 days.  I would also like you to apply the stronger triamcinolone  ointment twice per day until the rash resolves.  Please abstain from itching the area at all and if you feel like you need to itch you can use the Vaseline and rub the area but please do not scratch it as this could lead to infection. Please bring your glucometer information to your next visit so Dr. Rochelle Chu can review your blood sugars as you may need adjustment to your regimen.  Your A1c was 8.4 today which is better but still not at goal. For your lisinopril , since you have just picked up new medication please just continue to take that.  I will not send in another medication at that time but included in my note that when you need a refill in a few months that we should consider changing the medication.  Please follow up with Dr. Rochelle Chu as scheduled  If you haven't already, sign up for My Chart to have easy access to your labs results, and communication with your primary care physician.  Call the clinic at [phone] if your symptoms worsen or you have any concerns.  Please be sure to schedule follow up at the front desk before you leave today.   Jonne Netters, DO Family Medicine

## 2024-02-26 ENCOUNTER — Encounter: Payer: Self-pay | Admitting: Student

## 2024-02-26 ENCOUNTER — Ambulatory Visit (INDEPENDENT_AMBULATORY_CARE_PROVIDER_SITE_OTHER): Admitting: Student

## 2024-02-26 VITALS — BP 90/31 | HR 58 | Wt 279.2 lb

## 2024-02-26 DIAGNOSIS — I959 Hypotension, unspecified: Secondary | ICD-10-CM | POA: Diagnosis not present

## 2024-02-26 DIAGNOSIS — L309 Dermatitis, unspecified: Secondary | ICD-10-CM | POA: Diagnosis not present

## 2024-02-26 DIAGNOSIS — I493 Ventricular premature depolarization: Secondary | ICD-10-CM

## 2024-02-26 NOTE — Patient Instructions (Signed)
 It was great to see you! Thank you for allowing me to participate in your care!   Our plans for today:  - Mail your Zio patch in  - return for next apt   Take care and seek immediate care sooner if you develop any concerns.   Dr. Glenn Lange, DO [facility]

## 2024-02-26 NOTE — Progress Notes (Unsigned)
    SUBJECTIVE:   CHIEF COMPLAINT / HPI:   Patient presents for removal of Zio patch as he does not feel comfortable removing it on his own. He is hypotensive and states this is due to undergoing bowel prep for colonoscopy is not eating but he does understand that he can do clears.  Has colonoscopy scheduled for tomorrow.  States he is feeling well, denies any shortness of breath, chest pain or lightheadedness.  States he is almost done completing the course of antibiotics prescribed by Dr. Ival Marines recently for the wounds on his legs.   PERTINENT  PMH / PSH: HTN, CKD AD, angina, frequent PVCs, T2DM, obesity  OBJECTIVE:   BP (!) 90/31   Pulse (!) 58   Wt 279 lb 3.2 oz (126.6 kg)   SpO2 99%   BMI 43.73 kg/m    General: NAD, pleasant, well-appearing Cardiac: RRR, no murmurs. Respiratory: CTAB, normal effort, No wheezes, rales or rhonchi Extremities: no edema BLEs Skin: Several diffusely scattered lesions of bilateral lower legs in different stages of healing, some scabbed over, some appear to be ulcerated, no drainage or significant erythema Neuro: alert, no obvious focal deficits Psych: Normal affect and mood  ASSESSMENT/PLAN:   Hypotension Reassuringly, patient is asymptomatic.  Low concern for sepsis given he feels well and I do not see any signs of infection of the wounds on his legs.  Agree that hypotension is most likely due to bowel prep.  Encouraged increasing clears and returning/going to ED if he becomes symptomatic.  Will not hold low-dose of Coreg  he is asymptomatic. BP will be rechecked tomorrow prior to colonoscopy   Frequent PVCs PVCs noted on recent sleep study and Zio patch ordered by other provider. Zio patch easily removed during visit and I assisted patient in packaging and up.  He is advised to drop it off with [employer] and states he understands.  Dermatitis Patient has appointment scheduled with PCP on 02/3624 to monitor leg wounds/dermatitis thought to be related  to chigger bites.  Saw Dr. Ival Marines a few days ago and prescribed triamcinolone  ointment and a course of doxycycline  in case of infection.  No signs of infection today.  Patient advised to continue triamcinolone  and follow-up with PCP as scheduled.  If still not healing, could consider biopsy to confirm diagnosis, could also consider ABIs to assess for blood flow.     Dr. Glenn Lange, DO [facility]

## 2024-02-27 ENCOUNTER — Encounter: Payer: Self-pay | Admitting: Gastroenterology

## 2024-02-27 ENCOUNTER — Ambulatory Visit: Admitting: Gastroenterology

## 2024-02-27 VITALS — BP 162/72 | HR 61 | Temp 98.3°F | Resp 11 | Ht 67.0 in | Wt 275.0 lb

## 2024-02-27 DIAGNOSIS — K5730 Diverticulosis of large intestine without perforation or abscess without bleeding: Secondary | ICD-10-CM

## 2024-02-27 DIAGNOSIS — K589 Irritable bowel syndrome without diarrhea: Secondary | ICD-10-CM

## 2024-02-27 DIAGNOSIS — K635 Polyp of colon: Secondary | ICD-10-CM

## 2024-02-27 DIAGNOSIS — K562 Volvulus: Secondary | ICD-10-CM

## 2024-02-27 DIAGNOSIS — D123 Benign neoplasm of transverse colon: Secondary | ICD-10-CM

## 2024-02-27 DIAGNOSIS — Z1211 Encounter for screening for malignant neoplasm of colon: Secondary | ICD-10-CM | POA: Diagnosis not present

## 2024-02-27 DIAGNOSIS — D120 Benign neoplasm of cecum: Secondary | ICD-10-CM | POA: Diagnosis not present

## 2024-02-27 DIAGNOSIS — Z860100 Personal history of colon polyps, unspecified: Secondary | ICD-10-CM

## 2024-02-27 DIAGNOSIS — L290 Pruritus ani: Secondary | ICD-10-CM

## 2024-02-27 DIAGNOSIS — K648 Other hemorrhoids: Secondary | ICD-10-CM

## 2024-02-27 MED ORDER — SODIUM CHLORIDE 0.9 % IV SOLN: 500.0000 mL | Freq: Once | INTRAVENOUS | Status: DC

## 2024-02-27 NOTE — Progress Notes (Signed)
 Called to room to assist during endoscopic procedure.  Patient ID and intended procedure confirmed with present staff. Received instructions for my participation in the procedure from the performing physician.

## 2024-02-27 NOTE — Patient Instructions (Signed)
 Please read handouts provided. Continue present medications. Resume Effient  tomorrow tomorrow. Await pathology results. Consider topical barrier cream ( Desitin ? ) for perianal rash. Defer to PCP for management. Resume previous diet.   YOU HAD AN ENDOSCOPIC PROCEDURE TODAY AT THE [facility]:   Refer to the procedure report that was given to you for any specific questions about what was found during the examination.  If the procedure report does not answer your questions, please call your gastroenterologist to clarify.  If you requested that your care partner not be given the details of your procedure findings, then the procedure report has been included in a sealed envelope for you to review at your convenience later.  YOU SHOULD EXPECT: Some feelings of bloating in the abdomen. Passage of more gas than usual.  Walking can help get rid of the air that was put into your GI tract during the procedure and reduce the bloating. If you had a lower endoscopy (such as a colonoscopy or flexible sigmoidoscopy) you may notice spotting of blood in your stool or on the toilet paper. If you underwent a bowel prep for your procedure, you may not have a normal bowel movement for a few days.  Please Note:  You might notice some irritation and congestion in your nose or some drainage.  This is from the oxygen used during your procedure.  There is no need for concern and it should clear up in a day or so.  SYMPTOMS TO REPORT IMMEDIATELY:  Following lower endoscopy (colonoscopy or flexible sigmoidoscopy):  Excessive amounts of blood in the stool  Significant tenderness or worsening of abdominal pains  Swelling of the abdomen that is new, acute  Fever of 100F or higher.  For urgent or emergent issues, a gastroenterologist can be reached at any hour by calling [phone]. Do not use MyChart messaging for urgent concerns.    DIET:  We do recommend a small meal at first, but then you may  proceed to your regular diet.  Drink plenty of fluids but you should avoid alcoholic beverages for 24 hours.  ACTIVITY:  You should plan to take it easy for the rest of today and you should NOT DRIVE or use heavy machinery until tomorrow (because of the sedation medicines used during the test).    FOLLOW UP: Our staff will call the number listed on your records the next business day following your procedure.  We will call around 7:15- 8:00 am to check on you and address any questions or concerns that you may have regarding the information given to you following your procedure. If we do not reach you, we will leave a message.     If any biopsies were taken you will be contacted by phone or by letter within the next 1-3 weeks.  Please call us  at [phone] if you have not heard about the biopsies in 3 weeks.    SIGNATURES/CONFIDENTIALITY: You and/or your care partner have signed paperwork which will be entered into your electronic medical record.  These signatures attest to the fact that that the information above on your After Visit Summary has been reviewed and is understood.  Full responsibility of the confidentiality of this discharge information lies with you and/or your care-partner.

## 2024-02-27 NOTE — Progress Notes (Signed)
 Pt's states no medical or surgical changes since previsit or office visit.  Pt reports last BM looked like diarrhrea. Dr General Kenner made aware. Pt to do fleet enema per Dr. General Kenner.  Pt used the restroom and stool is clear. Pt reports his first stool was brown and got confused.

## 2024-02-27 NOTE — Progress Notes (Signed)
 Vss nad trans to pacu

## 2024-02-27 NOTE — Op Note (Signed)
 [facility] Patient Name: [name] [name] Procedure Date: 02/27/2024 3:29 PM MRN: [id number] Endoscopist: Landon Pinion P. General Kenner , MD, [id number] Age: 63 Referring MD:  Date of Birth: [birth year] Gender: Male Account #: [id number] Procedure:                Colonoscopy Indications:              High risk colon cancer surveillance: Personal                            history of colonic polyps - 11 adenomas removed                            2021, 2 adenomas removed 2022 Medicines:                Monitored Anesthesia Care Procedure:                Pre-Anesthesia Assessment:                           - Prior to the procedure, a History and Physical                            was performed, and patient medications and                            allergies were reviewed. The patient's tolerance of                            previous anesthesia was also reviewed. The risks                            and benefits of the procedure and the sedation                            options and risks were discussed with the patient.                            All questions were answered, and informed consent                            was obtained. Prior Anticoagulants: The patient has                            taken Effient  (prasugrel ), last dose was 5 days                            prior to procedure. ASA Grade Assessment: III - A                            patient with severe systemic disease. After                            reviewing the risks and benefits, the patient was  deemed in satisfactory condition to undergo the                            procedure.                           After obtaining informed consent, the colonoscope                            was passed under direct vision. Throughout the                            procedure, the patient's blood pressure, pulse, and                            oxygen saturations were monitored continuously. The                             Olympus Scope [id number] was introduced through the                            anus and advanced to the the cecum, identified by                            appendiceal orifice and ileocecal valve. The                            colonoscopy was technically difficult and complex                            due to significant looping. The patient tolerated                            the procedure well. The quality of the bowel                            preparation was adequate. The ileocecal valve,                            appendiceal orifice, and rectum were photographed. Scope In: 3:44:23 PM Scope Out: 4:08:30 PM Scope Withdrawal Time: 0 hours 14 minutes 59 seconds  Total Procedure Duration: 0 hours 24 minutes 7 seconds  Findings:                 The perianal exam findings include a perianal rash.                           A 3 mm polyp was found in the cecum. The polyp was                            sessile. The polyp was removed with a cold snare.                            Resection and retrieval were complete.  A few small-mouthed diverticula were found in the                            right colon.                           A 4 mm polyp was found in the transverse colon. The                            polyp was sessile. The polyp was removed with a                            cold snare. Resection and retrieval were complete.                           A 3 mm polyp was found in the splenic flexure. The                            polyp was sessile. The polyp was removed with a                            cold snare. Resection and retrieval were complete.                           Internal hemorrhoids were found during retroflexion.                           There was spasm in the entire colon which prolonged                            the exam. There was also looping in the right                            colon. Abdominal pressure applied to  help achieve                            cecal intubation.                           The exam was otherwise without abnormality. Complications:            No immediate complications. Estimated blood loss:                            Minimal. Estimated Blood Loss:     Estimated blood loss was minimal. Impression:               - Perianal rash found on perianal exam.                           - One 3 mm polyp in the cecum, removed with a cold                            snare. Resected and retrieved.                           -  Diverticulosis in the right colon.                           - One 4 mm polyp in the transverse colon, removed                            with a cold snare. Resected and retrieved.                           - One 3 mm polyp at the splenic flexure, removed                            with a cold snare. Resected and retrieved.                           - Internal hemorrhoids.                           - Colonic spasm.                           - Colonic looping.                           - The examination was otherwise normal. Recommendation:           - Patient has a contact number available for                            emergencies. The signs and symptoms of potential                            delayed complications were discussed with the                            patient. Return to normal activities tomorrow.                            Written discharge instructions were provided to the                            patient.                           - Resume previous diet.                           - Continue present medications.                           - Resume Effient  tomorrow.                           - Await pathology results.                           - Consider topical barrier cream (desitin?) for  perianal rash, defer to PCP for management. Landon Pinion P. Jony Ladnier, MD 02/27/2024 4:17:53 PM This report has been signed  electronically.

## 2024-02-27 NOTE — Progress Notes (Signed)
 [facility] History and Physical   Primary Care Physician:  Azell Boll, MD   Reason for Procedure:   History of colon polyps  Plan:    colonoscopy     HPI: [name] [name] is a 63 y.o. male  here for colonoscopy surveillance - history of polyps. Off Effient  for 5 days for this exam. Seen in the office 01/12/24 - no interval changes. . Patient denies any bowel symptoms at this time. Otherwise feels well without any cardiopulmonary symptoms.    I have discussed risks / benefits of anesthesia and endoscopic procedure with Ethelene Herald and they wish to proceed with the exams as outlined today.    Past Medical History:  Diagnosis Date   Alcoholism (HCC)    Anxiety    Arthritis    knees   Asthma    CAD S/P percutaneous coronary angioplasty 01/18/12; 10/2013   a. pRCA 3.5 x 18 vision BMS - 4.2 mm; b. 2/'15: mCx 3.5 x 12  Rebel BMS (3.6-3.7 mm)   Chronic back pain    mid/lower (08/01/2014)   Colon polyps 02/18/2020   May 2021 - 11 polyps that I removed during your recent procedure were proven to be adenomatous. Fortunately all of these polyps were extremely small in size.  These are considered to be pre-cancerous polyps that may have grown into cancers if they had not been removed.  Studies show that at least 20% of women over age 46 and 30% of men over age 44 have pre-cancerous polyps. Based on current nation   COPD (chronic obstructive pulmonary disease) (HCC)    I'm seeing COPD dr now; don't know if I've got it (08/01/2014)   Depression    Dyspnea    GERD (gastroesophageal reflux disease)    Hyperlipidemia    Hypertension    Migraines    once in awhile (08/01/2014); no current problems as of 09/18/23 per pt.   Non-ST elevation myocardial infarction (NSTEMI) (HCC) 10/2013   OSA on CPAP 01/19/2012   Pneumonia    Sleep apnea    uses CPAP nightly   Type II diabetes mellitus [facility])     Past Surgical History:  Procedure Laterality Date   CARDIAC CATHETERIZATION   07/2014   Left Main: Short, large-caliber vessel. Widely patent. Bifurcates into the LAD and Circumflex. Angiographically normal.   CORONARY ANGIOPLASTY WITH STENT PLACEMENT  01/2012; 11/07/2013; 08/01/2014   1 + 2 + 1    CORONARY ANGIOPLASTY WITH STENT PLACEMENT  07/2014   Severe single-vessel disease involving the bifurcation of OM1 and OM 2 with 99% in-stent restenosis in the bare-metal stent placed in the OM 2.   LEFT HEART CATHETERIZATION WITH CORONARY ANGIOGRAM N/A 01/20/2012   Procedure: LEFT HEART CATHETERIZATION WITH CORONARY ANGIOGRAM;  Surgeon: Lucendia Rusk, MD;  Location: [facility] CATH LAB;  Service: Cardiovascular;  Laterality: N/A;   LEFT HEART CATHETERIZATION WITH CORONARY ANGIOGRAM N/A 11/07/2013   Procedure: LEFT HEART CATHETERIZATION WITH CORONARY ANGIOGRAM;  Surgeon: Lucendia Rusk, MD;  Location: [facility] CATH LAB;  Service: Cardiovascular;  Laterality: N/A;   LEFT HEART CATHETERIZATION WITH CORONARY ANGIOGRAM N/A 08/01/2014   Procedure: LEFT HEART CATHETERIZATION WITH CORONARY ANGIOGRAM;  Surgeon: Arleen Lacer, MD;  Location: [facility] CATH LAB;  Service: Cardiovascular;  Laterality: N/A;   MULTIPLE TOOTH EXTRACTIONS     PERCUTANEOUS CORONARY STENT INTERVENTION (PCI-S)  01/20/2012   Procedure: PERCUTANEOUS CORONARY STENT INTERVENTION (PCI-S);  Surgeon: Lucendia Rusk, MD;  Location: [facility] CATH LAB;  Service: Cardiovascular;;  PERCUTANEOUS CORONARY STENT INTERVENTION (PCI-S)  11/07/2013   Procedure: PERCUTANEOUS CORONARY STENT INTERVENTION (PCI-S);  Surgeon: Lucendia Rusk, MD;  Location: [facility] CATH LAB;  Service: Cardiovascular;;   TYMPANOMASTOIDECTOMY Left 11/16/2023   Procedure: LEFT OPEN CAVITY TYMPANOMASTOIDECTOMY;  Surgeon: Virgina Grills, MD;  Location: [facility];  Service: ENT;  Laterality: Left;    Prior to Admission medications   Medication Sig Start Date End Date Taking? Authorizing Provider  albuterol  (VENTOLIN  HFA) 108 (90 Base) MCG/ACT inhaler Inhale 1 puff into the  lungs every 6 (six) hours as needed for wheezing or shortness of breath.   Yes [provider]  aspirin  81 MG chewable tablet Chew 1 tablet (81 mg total) by mouth daily. 10/04/13  Yes Granville Layer, MD  atorvastatin  (LIPITOR ) 80 MG tablet TAKE 1 TABLET BY MOUTH DAILY Patient taking differently: Take 80 mg by mouth at bedtime. 04/25/23  Yes Marveen Slick, MD  Blood Pressure Monitoring (BLOOD PRESSURE CUFF) MISC 1 kit by Does not apply route 2 (two) times a week. 11/27/23  Yes Ivin Marrow, MD  carvedilol  (COREG ) 3.125 MG tablet TAKE 1 TABLET BY MOUTH TWICE A DAY WITH A MEAL 05/22/23  Yes Chambliss, Vesta Gourd, MD  citalopram  (CELEXA ) 20 MG tablet Take 1 tablet (20 mg total) by mouth daily. 07/19/23  Yes Chambliss, Vesta Gourd, MD  Continuous Glucose Receiver (FREESTYLE LIBRE 2 READER) DEVI Please use with Freestyle Libre Sensor. Dx: E11.9 01/19/24  Yes Azell Boll, MD  Continuous Glucose Sensor (FREESTYLE LIBRE 2 SENSOR) MISC APPLY SENSOR TO SKIN FOR CONTINUOUS BLOOD SUGAR MONITORING, REPLACE EVERY 14 DAYS 02/07/24  Yes Ivin Marrow, MD  glucose blood (ACCU-CHEK GUIDE) test strip Use as instructed 10/11/21  Yes Chambliss, Vesta Gourd, MD  hydrocortisone  2.5 % ointment Apply 1 Application topically. 02/18/20  Yes [provider]  insulin  glargine (LANTUS  SOLOSTAR) 100 UNIT/ML Solostar Pen Inject 20 Units into the skin daily. 02/21/24  Yes Jonne Netters, MD  Insulin  Pen Needle (DROPLET PEN NEEDLES) 29G X MISC Use to inject insulin  daily 11/22/23  Yes Azell Boll, MD  Insulin  Pen Needle (PEN NEEDLES) 32G X 4 MM MISC Use as directed with insulin  four times daily 09/15/21  Yes Chambliss, Vesta Gourd, MD  JARDIANCE  25 MG TABS tablet TAKE 1 TABLET BY MOUTH DAILY 05/22/23  Yes Marveen Slick, MD  Melatonin 2.5 MG CAPS Take 5 mg by mouth at bedtime.   Yes [provider]  metFORMIN  (GLUCOPHAGE ) 1000 MG tablet TAKE 1 TABLET BY MOUTH TWICE A DAY WITH A MEAL 01/25/24   Yes Azell Boll, MD  mometasone -formoterol  (DULERA ) 200-5 MCG/ACT AERO Inhale 2 puffs into the lungs in the morning and at bedtime. Patient taking differently: Inhale 2-3 puffs into the lungs in the morning and at bedtime. 05/16/23  Yes Marveen Slick, MD  Multiple Vitamin (MULTIVITAMIN) tablet Take 1 tablet by mouth daily.   Yes [provider]  NON FORMULARY Pt uses a c-pap nightly   Yes [provider]  nystatin  cream (MYCOSTATIN ) Apply topically. 02/17/16  Yes [provider]  Omega-3 Fatty Acids  (FISH OIL ) 1000 MG CAPS Take 1 capsule (1,000 mg total) by mouth daily. 08/01/19  Yes Hensel, Azucena Bollard, MD  prasugrel  (EFFIENT ) 10 MG TABS tablet Take 1 tablet (10 mg total) by mouth daily. 10/19/23  Yes [place], Peter M, MD  Semaglutide , 2 MG/DOSE, (OZEMPIC , 2 MG/DOSE,) 8 MG/3ML SOPN Inject 2 mg into the skin once a week. Monday 11/10/23  Yes Azell Boll, MD  tolnaftate (TINACTIN) 1 % cream Apply 1 Application topically. 08/12/23  Yes [provider]  nitroGLYCERIN  (NITROSTAT ) 0.4 MG SL tablet DISSOLVE 1 TABLET UNDER THE TONGUE FOR CHEST PAIN. IF PAIN REMAINS AFTER 5 MINUTES, CALL 911 AND REPEAT DOSE. MAX 3 TABLETS IN 15 MINUTES 11/01/21   Chambliss, Vesta Gourd, MD  omeprazole  (PRILOSEC) 20 MG capsule Take 1 capsule (20 mg total) by mouth 2 (two) times daily before a meal. 10/17/23   Cobb, Mariah Shines, NP  traMADol  (ULTRAM ) 50 MG tablet Take 50 mg by mouth every 12 (twelve) hours as needed for moderate pain (pain score 4-6).    [provider]  triamcinolone  ointment (KENALOG ) 0.1 % Apply 1 Application topically 2 (two) times daily. 02/21/24   Jonne Netters, MD    Current Outpatient Medications  Medication Sig Dispense Refill   albuterol  (VENTOLIN  HFA) 108 (90 Base) MCG/ACT inhaler Inhale 1 puff into the lungs every 6 (six) hours as needed for wheezing or shortness of breath.     aspirin  81 MG chewable tablet Chew 1 tablet (81 mg total) by mouth  daily. 180 tablet 2   atorvastatin  (LIPITOR ) 80 MG tablet TAKE 1 TABLET BY MOUTH DAILY (Patient taking differently: Take 80 mg by mouth at bedtime.) 90 tablet 3   Blood Pressure Monitoring (BLOOD PRESSURE CUFF) MISC 1 kit by Does not apply route 2 (two) times a week. 1 each 0   carvedilol  (COREG ) 3.125 MG tablet TAKE 1 TABLET BY MOUTH TWICE A DAY WITH A MEAL 180 tablet 3   citalopram  (CELEXA ) 20 MG tablet Take 1 tablet (20 mg total) by mouth daily. 90 tablet 1   Continuous Glucose Receiver (FREESTYLE LIBRE 2 READER) DEVI Please use with [employer]. Dx: E11.9 1 each 0   Continuous Glucose Sensor (FREESTYLE LIBRE 2 SENSOR) MISC APPLY SENSOR TO SKIN FOR CONTINUOUS BLOOD SUGAR MONITORING, REPLACE EVERY 14 DAYS 6 each 1   glucose blood (ACCU-CHEK GUIDE) test strip Use as instructed 100 each 12   hydrocortisone  2.5 % ointment Apply 1 Application topically.     insulin  glargine (LANTUS  SOLOSTAR) 100 UNIT/ML Solostar Pen Inject 20 Units into the skin daily.     Insulin  Pen Needle (DROPLET PEN NEEDLES) 29G X MISC Use to inject insulin  daily 100 each 1   Insulin  Pen Needle (PEN NEEDLES) 32G X 4 MM MISC Use as directed with insulin  four times daily 100 each 12   JARDIANCE  25 MG TABS tablet TAKE 1 TABLET BY MOUTH DAILY 90 tablet 3   Melatonin 2.5 MG CAPS Take 5 mg by mouth at bedtime.     metFORMIN  (GLUCOPHAGE ) 1000 MG tablet TAKE 1 TABLET BY MOUTH TWICE A DAY WITH A MEAL 180 tablet 3   mometasone -formoterol  (DULERA ) 200-5 MCG/ACT AERO Inhale 2 puffs into the lungs in the morning and at bedtime. (Patient taking differently: Inhale 2-3 puffs into the lungs in the morning and at bedtime.) 39 g 0   Multiple Vitamin (MULTIVITAMIN) tablet Take 1 tablet by mouth daily.     NON FORMULARY Pt uses a c-pap nightly     nystatin  cream (MYCOSTATIN ) Apply topically.     Omega-3 Fatty Acids  (FISH OIL ) 1000 MG CAPS Take 1 capsule (1,000 mg total) by mouth daily.     prasugrel  (EFFIENT ) 10 MG TABS tablet  Take 1 tablet (10 mg total) by mouth daily. 90 tablet 2   Semaglutide , 2 MG/DOSE, (OZEMPIC , 2 MG/DOSE,) 8 MG/3ML SOPN  Inject 2 mg into the skin once a week. Monday 9 mL 2   tolnaftate (TINACTIN) 1 % cream Apply 1 Application topically.     nitroGLYCERIN  (NITROSTAT ) 0.4 MG SL tablet DISSOLVE 1 TABLET UNDER THE TONGUE FOR CHEST PAIN. IF PAIN REMAINS AFTER 5 MINUTES, CALL 911 AND REPEAT DOSE. MAX 3 TABLETS IN 15 MINUTES 100 tablet 0   omeprazole  (PRILOSEC) 20 MG capsule Take 1 capsule (20 mg total) by mouth 2 (two) times daily before a meal. 60 capsule 5   traMADol  (ULTRAM ) 50 MG tablet Take 50 mg by mouth every 12 (twelve) hours as needed for moderate pain (pain score 4-6).     triamcinolone  ointment (KENALOG ) 0.1 % Apply 1 Application topically 2 (two) times daily. 80 g 3   Current Facility-Administered Medications  Medication Dose Route Frequency Provider Last Rate Last Admin   0.9 %  sodium chloride  infusion  500 mL Intravenous Once Aaira Oestreicher, Lendon Queen, MD        Allergies as of 02/27/2024 - Review Complete 02/27/2024  Allergen Reaction Noted   Shellfish allergy Nausea And Vomiting 04/15/2013   Yellow dye Other (See Comments) 08/10/2023    Family History  Problem Relation Age of Onset   Breast cancer Mother    Heart attack Father 80   Hypertension Sister    Hyperlipidemia Sister    Hyperlipidemia Sister    Hypertension Sister    Diabetes Sister    Heart attack Brother    Stroke Brother    Diabetes Brother    Colon cancer Neg Hx    Esophageal cancer Neg Hx    Rectal cancer Neg Hx    Stomach cancer Neg Hx     Social History   Socioeconomic History   Marital status: Single    Spouse name: Not on file   Number of children: 0   Years of education: 15   Highest education level: Not on file  Occupational History   Occupation: Unemployed    Comment: Used to work for [employer]   Occupation: Consulting civil engineer, Museum/gallery curator  Tobacco Use   Smoking status: Former     Current packs/day: 0.00    Average packs/day: 1 pack/day for 42.4 years (42.4 ttl pk-yrs)    Types: Cigarettes    Start date: 09/13/1975    Quit date: 01/25/2018    Years since quitting: 6.0   Smokeless tobacco: Never   Tobacco comments:    Bupropion  helping  Vaping Use   Vaping status: Never Used  Substance and Sexual Activity   Alcohol use: Not Currently    Comment: quit 3 yrs ago in 12/2020 per pt.   Drug use: No    Comment: hx Crack Cocaine ( 05/09/16)   Sexual activity: Not Currently  Other Topics Concern   Not on file  Social History Narrative   Single.  Lives with a roommate.  Ambulates independently.   Social Drivers of Corporate investment banker Strain: Not on file  Food Insecurity: No Food Insecurity (11/16/2023)   Hunger Vital Sign    Worried About Running Out of Food in the Last Year: Never true    Ran Out of Food in the Last Year: Never true  Transportation Needs: No Transportation Needs (11/16/2023)   PRAPARE - Administrator, Civil Service (Medical): No    Lack of Transportation (Non-Medical): No  Physical Activity: Not on file  Stress: Not on file  Social Connections: Not on file  Intimate Partner Violence: Not At Risk (11/16/2023)   Humiliation, Afraid, Rape, and Kick questionnaire    Fear of Current or Ex-Partner: No    Emotionally Abused: No    Physically Abused: No    Sexually Abused: No    Review of Systems: All other review of systems negative except as mentioned in the HPI.  Physical Exam: Vital signs BP (!) 152/72   Pulse 63   Temp 98.3 F (36.8 C) (Temporal)   Ht 5' 7 (1.702 m)   Wt 275 lb (124.7 kg)   SpO2 96%   BMI 43.07 kg/m   General:   Alert,  Well-developed, pleasant and cooperative in NAD Lungs:  Clear throughout to auscultation.   Heart:  Regular rate and rhythm Abdomen:  Soft, nontender and nondistended.   Neuro/Psych:  Alert and cooperative. Normal mood and affect. A and O x 3  Christi Coward, MD [facility]  Gastroenterology

## 2024-02-28 ENCOUNTER — Telehealth: Payer: Self-pay

## 2024-02-28 DIAGNOSIS — I959 Hypotension, unspecified: Secondary | ICD-10-CM | POA: Insufficient documentation

## 2024-02-28 NOTE — Telephone Encounter (Signed)
 Patient returning call. States he is doing well and feeling great. Please give a f/u call if needing to further advise.   Thank you

## 2024-02-28 NOTE — Assessment & Plan Note (Signed)
 PVCs noted on recent sleep study and Zio patch ordered by other provider. Zio patch easily removed during visit and I assisted patient in packaging and up.  He is advised to drop it off with [employer] and states he understands.

## 2024-02-28 NOTE — Telephone Encounter (Signed)
  Follow up Call-     02/27/2024    2:14 PM  Call back number  Post procedure Call Back phone  # [phone]  Permission to leave phone message Yes    Post op call attempted, no answer, left VM.

## 2024-02-28 NOTE — Assessment & Plan Note (Signed)
 Reassuringly, patient is asymptomatic.  Low concern for sepsis given he feels well and I do not see any signs of infection of the wounds on his legs.  Agree that hypotension is most likely due to bowel prep.  Encouraged increasing clears and returning/going to ED if he becomes symptomatic.  Will not hold low-dose of Coreg  he is asymptomatic. BP will be rechecked tomorrow prior to colonoscopy

## 2024-02-28 NOTE — Assessment & Plan Note (Addendum)
 Patient has appointment scheduled with PCP on 02/3624 to monitor leg wounds/dermatitis thought to be related to chigger bites.  Saw Dr. Ival Marines a few days ago and prescribed triamcinolone  ointment and a course of doxycycline  in case of infection.  No signs of infection today.  Patient advised to continue triamcinolone  and follow-up with PCP as scheduled.  If still not healing, could consider biopsy to confirm diagnosis, could also consider ABIs to assess for blood flow.

## 2024-03-01 LAB — SURGICAL PATHOLOGY

## 2024-03-04 ENCOUNTER — Other Ambulatory Visit: Payer: Self-pay

## 2024-03-04 DIAGNOSIS — R0602 Shortness of breath: Secondary | ICD-10-CM

## 2024-03-05 ENCOUNTER — Ambulatory Visit: Payer: Self-pay | Admitting: Gastroenterology

## 2024-03-06 ENCOUNTER — Ambulatory Visit (INDEPENDENT_AMBULATORY_CARE_PROVIDER_SITE_OTHER): Admitting: Pulmonary Disease

## 2024-03-06 ENCOUNTER — Encounter

## 2024-03-06 ENCOUNTER — Encounter: Payer: Self-pay | Admitting: Pulmonary Disease

## 2024-03-06 ENCOUNTER — Other Ambulatory Visit: Payer: Self-pay | Admitting: Nurse Practitioner

## 2024-03-06 VITALS — BP 126/70 | HR 67 | Ht 67.0 in | Wt 275.4 lb

## 2024-03-06 DIAGNOSIS — K219 Gastro-esophageal reflux disease without esophagitis: Secondary | ICD-10-CM

## 2024-03-06 DIAGNOSIS — J449 Chronic obstructive pulmonary disease, unspecified: Secondary | ICD-10-CM

## 2024-03-06 DIAGNOSIS — G4733 Obstructive sleep apnea (adult) (pediatric): Secondary | ICD-10-CM | POA: Diagnosis not present

## 2024-03-06 DIAGNOSIS — R0602 Shortness of breath: Secondary | ICD-10-CM

## 2024-03-06 LAB — PULMONARY FUNCTION TEST
DL/VA % pred: 110 %
DL/VA: 4.67 ml/min/mmHg/L
DLCO unc % pred: 100 %
DLCO unc: 24.95 ml/min/mmHg
FEF 25-75 Post: 0.9 L/s
FEF 25-75 Pre: 1.06 L/s
FEF2575-%Change-Post: -14 %
FEF2575-%Pred-Post: 34 %
FEF2575-%Pred-Pre: 40 %
FEV1-%Change-Post: -8 %
FEV1-%Pred-Post: 49 %
FEV1-%Pred-Pre: 53 %
FEV1-Post: 1.55 L
FEV1-Pre: 1.69 L
FEV1FVC-%Change-Post: -6 %
FEV1FVC-%Pred-Pre: 78 %
FEV6-%Change-Post: 0 %
FEV6-%Pred-Post: 69 %
FEV6-%Pred-Pre: 69 %
FEV6-Post: 2.77 L
FEV6-Pre: 2.78 L
FEV6FVC-%Change-Post: 0 %
FEV6FVC-%Pred-Post: 104 %
FEV6FVC-%Pred-Pre: 103 %
FVC-%Change-Post: -1 %
FVC-%Pred-Post: 66 %
FVC-%Pred-Pre: 67 %
FVC-Post: 2.8 L
FVC-Pre: 2.84 L
Post FEV1/FVC ratio: 56 %
Post FEV6/FVC ratio: 99 %
Pre FEV1/FVC ratio: 59 %
Pre FEV6/FVC Ratio: 98 %
RV % pred: 199 %
RV: 4.23 L
TLC % pred: 113 %
TLC: 7.23 L

## 2024-03-06 MED ORDER — OMEPRAZOLE 20 MG PO CPDR: 20.0000 mg | DELAYED_RELEASE_CAPSULE | Freq: Two times a day (BID) | ORAL | 3 refills | Status: AC

## 2024-03-06 NOTE — Patient Instructions (Signed)
 Full pft performed today.

## 2024-03-06 NOTE — Patient Instructions (Signed)
 VISIT SUMMARY:  Today, you came in for a follow-up on your breathing and medication management. Your breathing has been stable, and you have not had any hospitalizations for over five months. You have been using your CPAP machine daily and find it helpful, along with a wedge pillow. You manage your COPD with Dulera  and albuterol , and you have not experienced any recent breathing difficulties. You also mentioned that your acid reflux is well-controlled with omeprazole .  YOUR PLAN:  -CHRONIC OBSTRUCTIVE PULMONARY DISEASE (COPD): COPD is a chronic lung condition that makes it hard to breathe. You have mild to moderate COPD, and your symptoms have been stable with your current medications. Continue taking Dulera  daily and use albuterol  as needed. We will also download data from your CPAP machine to verify its settings and usage.  -ACID REFLUX: Acid reflux occurs when stomach acid flows back into the esophagus, causing discomfort. Your acid reflux is well-managed with omeprazole , which also helps with your coughing and breathing. We have renewed your prescription for omeprazole .  INSTRUCTIONS:  Please bring your CPAP machine to your next appointment so we can download and verify its settings and usage.

## 2024-03-06 NOTE — Progress Notes (Signed)
 Full pft performed today.

## 2024-03-06 NOTE — Progress Notes (Signed)
 [name] [name]    [id number]    Jul 08, 1961  Primary Care Physician:Brown, Suzann HERO, MD  Referring Physician: Delores Suzann HERO, MD [address] [place],  [place] [address]  Chief complaint: Follow-up for COPD  HPI: 63 y.o. who  has a past medical history of Alcoholism (HCC), Anxiety, Arthritis, Asthma, CAD S/P percutaneous coronary angioplasty (01/18/12; 10/2013), Chronic back pain, Colon polyps (02/18/2020), COPD (chronic obstructive pulmonary disease) (HCC), Depression, Dyspnea, GERD (gastroesophageal reflux disease), Hyperlipidemia, Hypertension, Migraines, Non-ST elevation myocardial infarction (NSTEMI) (HCC) (10/2013), OSA on CPAP (01/19/2012), Pneumonia, Sleep apnea, and Type II diabetes mellitus (HCC).   Discussed the use of AI scribe software for clinical note transcription with the patient, who gave verbal consent to proceed.  History of bronchitis and sleep apnea, presents for follow-up of breathing issues. He reports two hospital admissions in June and July 2024 for similar symptoms of COPD exacerbation. 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 In July 2024 he was admitted for 4 days at [facility] with a negative CT angiogram.  He was treated with bronchodilator therapy, IV steroids, inhalers, physical therapy with improvement. He denies current smoking but has a 35-40 year history of 10-20 cigarettes per day, having quit in 2020. He also reports a sensitivity to heat, which exacerbates his breathing issues. He denies any family history of lung disease. He uses Dulera  and Albuterol  inhalers daily for symptom management. He also has a CPAP machine for sleep apnea, which he uses nightly.   Interim history: History of Present Illness [name] [name] is a 63 year old male with COPD who presents for a follow-up on his breathing and medication management.  His breathing has been stable, with no hospitalizations for over five months. He  attributes previous hospitalizations to environmental exposures, such as a [employer] bus ride and a visit to a transmission shop. He uses his CPAP machine daily, which he has been using for the past 14 years, with pressure adjustments as needed. A sleep study in April led to an adjustment of his CPAP machine to 19 centimeters of water pressure. He has been using a new CPAP machine for the past four months and plans to bring it in for evaluation. He also uses a wedge pillow to aid his sleep, which he finds beneficial.  He manages his COPD with Dulera , taken twice daily, and albuterol , used as needed. He quit smoking nearly four years ago, which he acknowledges has contributed to lung damage in the past. No current breathing difficulties and reports stable COPD symptoms with the current medication regimen. He does not require supplemental oxygen.  He experiences acid reflux and takes omeprazole , which helps with his coughing and breathing. He has about 20 capsules left, which he estimates will last him ten days. He mentions that his blood pressure was low last week, measuring 91/30, but generally considers his blood pressure to be well-controlled.   Pets: Dog Occupation: On [employer] Exposures: No mold, hot tub, Jacuzzi.  No feather pillow or comforter Smoking history: 42-pack-year smoker.  Quit in 2022 Travel history:Originally from [place] .  No significant recent travel Relevant family history: No family history of lung disease  Outpatient Encounter Medications as of 03/06/2024  Medication Sig   albuterol  (VENTOLIN  HFA) 108 (90 Base) MCG/ACT inhaler Inhale 1 puff into the lungs every 6 (six) hours as needed for wheezing or shortness of breath.   aspirin  81 MG chewable  tablet Chew 1 tablet (81 mg total) by mouth daily.   atorvastatin  (LIPITOR ) 80 MG tablet TAKE 1 TABLET BY MOUTH DAILY (Patient taking differently: Take 80 mg by mouth at bedtime.)   Blood Pressure Monitoring (BLOOD PRESSURE  CUFF) MISC 1 kit by Does not apply route 2 (two) times a week.   carvedilol  (COREG ) 3.125 MG tablet TAKE 1 TABLET BY MOUTH TWICE A DAY WITH A MEAL   citalopram  (CELEXA ) 20 MG tablet Take 1 tablet (20 mg total) by mouth daily.   Continuous Glucose Receiver (FREESTYLE LIBRE 2 READER) DEVI Please use with [employer]. Dx: E11.9   Continuous Glucose Sensor (FREESTYLE LIBRE 2 SENSOR) MISC APPLY SENSOR TO SKIN FOR CONTINUOUS BLOOD SUGAR MONITORING, REPLACE EVERY 14 DAYS   glucose blood (ACCU-CHEK GUIDE) test strip Use as instructed   hydrocortisone  2.5 % ointment Apply 1 Application topically.   insulin  glargine (LANTUS  SOLOSTAR) 100 UNIT/ML Solostar Pen Inject 20 Units into the skin daily.   Insulin  Pen Needle (DROPLET PEN NEEDLES) 29G X MISC Use to inject insulin  daily   Insulin  Pen Needle (PEN NEEDLES) 32G X 4 MM MISC Use as directed with insulin  four times daily   JARDIANCE  25 MG TABS tablet TAKE 1 TABLET BY MOUTH DAILY   Melatonin 2.5 MG CAPS Take 5 mg by mouth at bedtime.   metFORMIN  (GLUCOPHAGE ) 1000 MG tablet TAKE 1 TABLET BY MOUTH TWICE A DAY WITH A MEAL   mometasone -formoterol  (DULERA ) 200-5 MCG/ACT AERO Inhale 2 puffs into the lungs in the morning and at bedtime. (Patient taking differently: Inhale 2-3 puffs into the lungs in the morning and at bedtime.)   Multiple Vitamin (MULTIVITAMIN) tablet Take 1 tablet by mouth daily.   nitroGLYCERIN  (NITROSTAT ) 0.4 MG SL tablet DISSOLVE 1 TABLET UNDER THE TONGUE FOR CHEST PAIN. IF PAIN REMAINS AFTER 5 MINUTES, CALL 911 AND REPEAT DOSE. MAX 3 TABLETS IN 15 MINUTES   NON FORMULARY Pt uses a c-pap nightly   nystatin  cream (MYCOSTATIN ) Apply topically.   Omega-3 Fatty Acids  (FISH OIL ) 1000 MG CAPS Take 1 capsule (1,000 mg total) by mouth daily.   omeprazole  (PRILOSEC) 20 MG capsule TAKE 1 CAPSULE BY MOUTH 2 TIMES A DAY BEFORE A MEAL   prasugrel  (EFFIENT ) 10 MG TABS tablet Take 1 tablet (10 mg total) by mouth daily.   Semaglutide , 2  MG/DOSE, (OZEMPIC , 2 MG/DOSE,) 8 MG/3ML SOPN Inject 2 mg into the skin once a week. Monday   tolnaftate (TINACTIN) 1 % cream Apply 1 Application topically.   traMADol  (ULTRAM ) 50 MG tablet Take 50 mg by mouth every 12 (twelve) hours as needed for moderate pain (pain score 4-6).   triamcinolone  ointment (KENALOG ) 0.1 % Apply 1 Application topically 2 (two) times daily.   [DISCONTINUED] omeprazole  (PRILOSEC) 20 MG capsule Take 1 capsule (20 mg total) by mouth 2 (two) times daily before a meal.   No facility-administered encounter medications on file as of 03/06/2024.   Physical Exam: Blood pressure 126/70, pulse 67, height 5' 7 (1.702 m), weight 275 lb 6.4 oz (124.9 kg), SpO2 95%. Gen:      No acute distress HEENT:  EOMI, sclera anicteric Neck:     No masses; no thyromegaly Lungs:    Clear to auscultation bilaterally; normal respiratory effort CV:         Regular rate and rhythm; no murmurs Abd:      + bowel sounds; soft, non-tender; no palpable masses, no distension Ext:    No edema;  adequate peripheral perfusion Neuro: alert and oriented x 3 Psych: normal mood and affect   Data Reviewed: Imaging: CTA 04/11/2023-no pulmonary embolism, centrilobular thickening.  Stable right middle lobe perifissural nodule I have reviewed the images personally.  PFTs: 03/06/2024 FVC 2.80 [6 6%], FEV1 1.55 [49%], F/F56, TLC 7.23 [113%], DLCO 24.95 [100%] Moderate obstructive airway disease  Labs: WBC 13.3, hemoglobin 14.1, platelets 269, eos 0%  Sleep PSG, CPAP titration 12/19/2023 Baseline AHI 110.6/h desaturation to 80% Recommend CPAP at 19 cm water or AutoPap 10-20  Assessment & Plan Chronic Obstructive Pulmonary Disease (COPD) Mild to moderate COPD with a smoking history, last exacerbation in January. No hospitalizations in over five months. Continues Dulera  daily and albuterol  as needed. No supplemental oxygen required. CPAP machine settings and usage need verification. - Continue Dulera   daily - Use albuterol  as needed  OSA Recently had his CPAP pressure adjusted to 19 cmH2O after titration study - Order download from CPAP machine at return visit to verify settings and usage.  The machine he is brought in today shows 0% usage and this is not the new machine that he is using every day  Acid Reflux Acid reflux managed with omeprazole , effectively controlling symptoms and improving coughing and breathing. - Renew prescription for omeprazole    Former smoker -On lung cancer screening protocol per his primary care.   Recommendations: Continue Dulera  Albuterol  as needed CPAP for OSA  Lonna Coder MD  Pulmonary and Critical Care 03/06/2024, 4:19 PM  CC: Delores Suzann HERO, MD

## 2024-03-08 NOTE — Progress Notes (Unsigned)
    SUBJECTIVE:   CHIEF COMPLAINT: Follow-up HPI:   [name] [name] is a 63 y.o.  with history notable for type 2 diabetes, COPD, previous smoker, coronary artery disease on prasugrel  presenting for follow-up.   PERTINENT  PMH / PSH/Family/Social History : ***  OBJECTIVE:   There were no vitals taken for this visit.  Today's weight:  Review of prior weights: There were no vitals filed for this visit.  ***  ASSESSMENT/PLAN:   Assessment & Plan Essential hypertension  Type 2 diabetes mellitus without complication, with long-term current use of insulin  (HCC)  Former smoker     Suzann Daring, MD  Family Medicine Teaching Service  [facility]

## 2024-03-11 ENCOUNTER — Ambulatory Visit (INDEPENDENT_AMBULATORY_CARE_PROVIDER_SITE_OTHER): Admitting: Family Medicine

## 2024-03-11 ENCOUNTER — Other Ambulatory Visit: Payer: Self-pay | Admitting: *Deleted

## 2024-03-11 ENCOUNTER — Encounter: Payer: Self-pay | Admitting: Family Medicine

## 2024-03-11 VITALS — BP 110/60 | HR 83 | Ht 67.0 in | Wt 275.2 lb

## 2024-03-11 DIAGNOSIS — I10 Essential (primary) hypertension: Secondary | ICD-10-CM | POA: Diagnosis present

## 2024-03-11 DIAGNOSIS — Z794 Long term (current) use of insulin: Secondary | ICD-10-CM | POA: Diagnosis not present

## 2024-03-11 DIAGNOSIS — E119 Type 2 diabetes mellitus without complications: Secondary | ICD-10-CM

## 2024-03-11 DIAGNOSIS — L309 Dermatitis, unspecified: Secondary | ICD-10-CM

## 2024-03-11 DIAGNOSIS — Z87891 Personal history of nicotine dependence: Secondary | ICD-10-CM | POA: Diagnosis not present

## 2024-03-11 MED ORDER — TRIAMCINOLONE ACETONIDE 0.1 % EX OINT: 1.0000 | TOPICAL_OINTMENT | Freq: Two times a day (BID) | CUTANEOUS | 3 refills | Status: AC

## 2024-03-11 MED ORDER — ZINC OXIDE 10 % EX OINT: TOPICAL_OINTMENT | CUTANEOUS | 3 refills | Status: DC

## 2024-03-11 MED ORDER — KETOCONAZOLE 2 % EX CREA: 1.0000 | TOPICAL_CREAM | Freq: Every day | CUTANEOUS | 0 refills | Status: AC

## 2024-03-11 NOTE — Patient Instructions (Addendum)
 It was wonderful to see you today.  Please bring ALL of your medications with you to every visit.   Today we talked about:  - Continue your medications  Your diabetes is doing great!!!  I sent in refills  I sent in a cream to go between your toes-Ketoconazole   I sent in your refills  You can go swimming   Your lung scan is scheduled  You needs labs today   Please follow up in 2 months   Thank you for choosing [facility].   Please call [phone] with any questions about today's appointment.  Please be sure to schedule follow up at the front  desk before you leave today.   Suzann Daring, MD  Family Medicine

## 2024-03-11 NOTE — Telephone Encounter (Signed)
 Per pharmacy 10% not available, can you send in 20%? Darious Rehman [place], CMA

## 2024-03-11 NOTE — Assessment & Plan Note (Signed)
 This is slowly improving.  I refilled his Kenalog .  Suspect this is either due to fleas and/or chiggers.  He concomitantly has a tinea pedis between his digits.  Prescribed ketoconazole .  I considered an id reaction but I think this is less likely given the distribution of the rash on his lower extremities.

## 2024-03-11 NOTE — Assessment & Plan Note (Signed)
 Blood pressure on lower side today.  Continue to monitor closely.  He is asymptomatic and on manual repeat is appropriate.

## 2024-03-11 NOTE — Assessment & Plan Note (Signed)
 Markedly improved.  Congratulated on changes

## 2024-03-11 NOTE — Assessment & Plan Note (Signed)
Low dose CT ordered and scheduled

## 2024-03-12 ENCOUNTER — Ambulatory Visit: Payer: Self-pay | Admitting: Family Medicine

## 2024-03-12 LAB — LIPID PANEL
Chol/HDL Ratio: 2.9 ratio (ref 0.0–5.0)
Cholesterol, Total: 95 mg/dL — ABNORMAL LOW (ref 100–199)
HDL: 33 mg/dL — ABNORMAL LOW (ref >39)
LDL Chol Calc (NIH): 40 mg/dL (ref 0–99)
Triglycerides: 120 mg/dL (ref 0–149)
VLDL Cholesterol Cal: 22 mg/dL (ref 5–40)

## 2024-03-12 LAB — BASIC METABOLIC PANEL WITH GFR
BUN/Creatinine Ratio: 19 (ref 10–24)
BUN: 14 mg/dL (ref 8–27)
CO2: 21 mmol/L (ref 20–29)
Calcium: 9.3 mg/dL (ref 8.6–10.2)
Chloride: 100 mmol/L (ref 96–106)
Creatinine, Ser: 0.72 mg/dL — ABNORMAL LOW (ref 0.76–1.27)
Glucose: 170 mg/dL — ABNORMAL HIGH (ref 70–99)
Potassium: 4.5 mmol/L (ref 3.5–5.2)
Sodium: 137 mmol/L (ref 134–144)
eGFR: 103 mL/min/{1.73_m2} (ref >59)

## 2024-03-12 LAB — CBC
Hematocrit: 40.2 % (ref 37.5–51.0)
Hemoglobin: 12.4 g/dL — ABNORMAL LOW (ref 13.0–17.7)
MCH: 27.1 pg (ref 26.6–33.0)
MCHC: 30.8 g/dL — ABNORMAL LOW (ref 31.5–35.7)
MCV: 88 fL (ref 79–97)
Platelets: 272 10*3/uL (ref 150–450)
RBC: 4.57 x10E6/uL (ref 4.14–5.80)
RDW: 13.8 % (ref 11.6–15.4)
WBC: 9.3 10*3/uL (ref 3.4–10.8)

## 2024-03-12 MED ORDER — ZINC OXIDE 20 % EX OINT: 1.0000 | TOPICAL_OINTMENT | CUTANEOUS | 0 refills | Status: DC | PRN

## 2024-03-12 NOTE — Telephone Encounter (Signed)
 Alternative sent

## 2024-03-13 DIAGNOSIS — Z9189 Other specified personal risk factors, not elsewhere classified: Secondary | ICD-10-CM

## 2024-03-22 ENCOUNTER — Ambulatory Visit (HOSPITAL_BASED_OUTPATIENT_CLINIC_OR_DEPARTMENT_OTHER)
Admission: RE | Admit: 2024-03-22 | Discharge: 2024-03-22 | Disposition: A | Discharge status: Home / Self Care | Source: Ambulatory Visit | Attending: Family Medicine | Admitting: Family Medicine

## 2024-03-22 DIAGNOSIS — Z87891 Personal history of nicotine dependence: Secondary | ICD-10-CM | POA: Diagnosis present

## 2024-03-25 ENCOUNTER — Ambulatory Visit (INDEPENDENT_AMBULATORY_CARE_PROVIDER_SITE_OTHER): Admitting: Podiatry

## 2024-03-25 DIAGNOSIS — M79675 Pain in left toe(s): Secondary | ICD-10-CM | POA: Diagnosis not present

## 2024-03-25 DIAGNOSIS — M79674 Pain in right toe(s): Secondary | ICD-10-CM | POA: Diagnosis not present

## 2024-03-25 DIAGNOSIS — B351 Tinea unguium: Secondary | ICD-10-CM

## 2024-03-25 NOTE — Progress Notes (Unsigned)
 Subjective:  Patient ID: [name] [name], male    DOB: [birth year],  MRN: [id number]  [name] [name] presents to clinic today for:  Chief Complaint  Patient presents with   Nail Problem    Nail trim    Patient notes nails are thick, discolored, elongated and painful in shoegear when trying to ambulate.    PCP is Delores Suzann HERO, MD.  Past Medical History:  Diagnosis Date   Alcoholism [facility])    Anxiety    Arthritis    knees   Asthma    CAD S/P percutaneous coronary angioplasty 01/18/12; 10/2013   a. pRCA 3.5 x 18 vision BMS - 4.2 mm; b. 2/'15: mCx 3.5 x 12  Rebel BMS (3.6-3.7 mm)   Chronic back pain    mid/lower (08/01/2014)   Colon polyps 02/18/2020   May 2021 - 11 polyps that I removed during your recent procedure were proven to be adenomatous. Fortunately all of these polyps were extremely small in size.  These are considered to be pre-cancerous polyps that may have grown into cancers if they had not been removed.  Studies show that at least 20% of women over age 61 and 30% of men over age 7 have pre-cancerous polyps. Based on current nation   COPD (chronic obstructive pulmonary disease) (HCC)    I'm seeing COPD dr now; don't know if I've got it (08/01/2014)   Depression    Dyspnea    GERD (gastroesophageal reflux disease)    Hyperlipidemia    Hypertension    Migraines    once in awhile (08/01/2014); no current problems as of 09/18/23 per pt.   Non-ST elevation myocardial infarction (NSTEMI) (HCC) 10/2013   OSA on CPAP 01/19/2012   Pneumonia    Sleep apnea    uses CPAP nightly   Type II diabetes mellitus [facility]    Past Surgical History:  Procedure Laterality Date   CARDIAC CATHETERIZATION  07/2014   Left Main: Short, large-caliber vessel. Widely patent. Bifurcates into the LAD and Circumflex. Angiographically normal.   CORONARY ANGIOPLASTY WITH STENT PLACEMENT  01/2012; 11/07/2013; 08/01/2014   1 + 2 + 1    CORONARY ANGIOPLASTY WITH STENT PLACEMENT  07/2014   Severe  single-vessel disease involving the bifurcation of OM1 and OM 2 with 99% in-stent restenosis in the bare-metal stent placed in the OM 2.   LEFT HEART CATHETERIZATION WITH CORONARY ANGIOGRAM N/A 01/20/2012   Procedure: LEFT HEART CATHETERIZATION WITH CORONARY ANGIOGRAM;  Surgeon: Candyce GORMAN Reek, MD;  Location: [facility] CATH LAB;  Service: Cardiovascular;  Laterality: N/A;   LEFT HEART CATHETERIZATION WITH CORONARY ANGIOGRAM N/A 11/07/2013   Procedure: LEFT HEART CATHETERIZATION WITH CORONARY ANGIOGRAM;  Surgeon: Candyce GORMAN Reek, MD;  Location: [facility] CATH LAB;  Service: Cardiovascular;  Laterality: N/A;   LEFT HEART CATHETERIZATION WITH CORONARY ANGIOGRAM N/A 08/01/2014   Procedure: LEFT HEART CATHETERIZATION WITH CORONARY ANGIOGRAM;  Surgeon: Alm LELON Clay, MD;  Location[facility] C[facility] LAB;  Service: Cardiovascular;  Laterality: N/A;   MULTIPLE TOOTH EXTRACTIONS     PERCUTANEOUS CORONARY STENT INTERVENTION (PCI-S)  01/20/2012   Procedure: PERCUTANEOUS CORONARY STENT INTERVENTION (PCI-S);  Surgeon: Candyce GORMAN Reek, MD;  Location: [facility] CATH LAB;  Service: Cardiovascular;;   PERCUTANEOUS CORONARY STENT INTERVENTION (PCI-S)  11/07/2013   Procedure: PERCUTANEOUS CORONARY STENT INTERVENTION (PCI-S);  Surgeon: Candyce GORMAN Reek, MD;  Location: [facility] CATH LAB;  Service: Cardiovascular;;   TYMPANOMASTOIDECTOMY Left 11/16/2023   Procedure: LEFT OPEN CAVITY TYMPANOMASTOIDECTOMY;  Surgeon: Carlie Clark, MD;  Location: [facility] OR;  Service: ENT;  Laterality: Left;   Allergies  Allergen Reactions   Shellfish Allergy Nausea And Vomiting    OYSTERS   Yellow Dye Other (See Comments)    Gi intolerance     Review of Systems: Negative except as noted in the HPI.  Objective:  [name] [name] is a pleasant 63 y.o. male in NAD. AAO x 3.  Vascular Examination: Capillary refill time is 3-5 seconds to toes bilateral. Palpable pedal pulses b/l LE. Digital hair present b/l.  Skin temperature gradient WNL b/l. No varicosities b/l. No  cyanosis noted b/l.   Dermatological Examination: Pedal skin with normal turgor, texture and tone b/l. No open wounds. No interdigital macerations b/l. Toenails x10 are 3mm thick, discolored, dystrophic with subungual debris. There is pain with compression of the nail plates.  They are elongated x10     Latest Ref Rng & Units 02/21/2024    9:48 AM 11/13/2023   11:30 AM 07/19/2023   11:23 AM 04/11/2023    7:45 AM  Hemoglobin A1C  Hemoglobin-A1c 0.0 - 7.0 % 8.4  8.6  7.9  8.2    Assessment/Plan: 1. Pain due to onychomycosis of toenails of both feet    The mycotic toenails were sharply debrided x10 with sterile nail nippers and a power debriding burr to decrease bulk/thickness and length.    Return in about 3 months (around 06/25/2024) for RFC.   Awanda CHARM [place], DPM, FACFAS [facility]     [address] [place], [place] [address]                Office [phone]  Fax [phone]

## 2024-03-27 ENCOUNTER — Ambulatory Visit: Payer: Self-pay | Admitting: Family Medicine

## 2024-03-27 ENCOUNTER — Other Ambulatory Visit: Payer: Self-pay | Admitting: Family Medicine

## 2024-03-28 NOTE — Telephone Encounter (Signed)
 Called patient to discuss cardiac monitor results.  Patient verified name and date of birth.  Discussed that couple nonsustained abnormal heartbeats.  Recommended that he just keep his appointment with cardiology on August 28.  Patient agreeable to plan.  States he will schedule his follow-up appointment in our clinic on August 1.

## 2024-04-01 ENCOUNTER — Encounter: Payer: Self-pay | Admitting: Family Medicine

## 2024-04-17 ENCOUNTER — Other Ambulatory Visit: Payer: Self-pay | Admitting: *Deleted

## 2024-04-17 DIAGNOSIS — Z794 Long term (current) use of insulin: Secondary | ICD-10-CM

## 2024-04-17 MED ORDER — FREESTYLE LIBRE 2 SENSOR MISC: 1 refills | Status: DC

## 2024-04-18 ENCOUNTER — Telehealth: Payer: Self-pay

## 2024-04-18 DIAGNOSIS — E119 Type 2 diabetes mellitus without complications: Secondary | ICD-10-CM

## 2024-04-18 MED ORDER — FREESTYLE LIBRE 2 PLUS SENSOR MISC
2 refills | Status: DC
Start: 2024-04-18 — End: 2024-05-16

## 2024-04-18 NOTE — Telephone Encounter (Signed)
 Patient calls nurse line in regards to [employer].  He reports he went to pick these up, however his insurance is denying them.   I called the pharmacy for more information.   Patient needs Libre 2 [employer] as previously prescribed and approved via PA.   Pharmacy reports he has refills on Libre 2 and reports they are going through his insurance just fine. She is getting these ready for patient.   I have updated the patient.

## 2024-04-18 NOTE — Telephone Encounter (Signed)
New Rx to pharmacy

## 2024-04-24 ENCOUNTER — Other Ambulatory Visit: Payer: Self-pay | Admitting: Family Medicine

## 2024-04-24 DIAGNOSIS — I2510 Atherosclerotic heart disease of native coronary artery without angina pectoris: Secondary | ICD-10-CM

## 2024-04-24 DIAGNOSIS — E785 Hyperlipidemia, unspecified: Secondary | ICD-10-CM

## 2024-04-29 ENCOUNTER — Telehealth: Payer: Self-pay

## 2024-04-29 ENCOUNTER — Other Ambulatory Visit (HOSPITAL_COMMUNITY): Payer: Self-pay

## 2024-04-29 NOTE — Telephone Encounter (Signed)
 Rec'd PA for [employer] however per PA: this has been previously approved until 12/13/24.   Per test claim, refill payable after 05/09/24.

## 2024-05-03 ENCOUNTER — Other Ambulatory Visit: Payer: Self-pay | Admitting: Otolaryngology

## 2024-05-06 NOTE — Progress Notes (Unsigned)
 05/09/2024 [name] [name]   [birth year]  [id number]  Primary Physician Delores Suzann HERO, MD Primary Cardiologist: [name]  HPI:  [name] [name] is seen today for follow up CAD. His past medical history is significant for CAD, DM, obesity, tobacco use , hyperlipidemia and hypertension. He  is status post stenting of the right coronary artery in May of 2013. He was admitted with a NSTEMI in February of 2015. He was found to have severe disease in LCx and OM2 treated with BMS.  He was placed on dual antiplatelet therapy with aspirin  plus Effient .   He presented with increased angina in October 2015. A Myoview  study was intermediate risk.  He had repeat cardiac cath in November 2015.  He was found to have severe single-vessel disease involving the bifurcation of OM1 and OM 2 with 99% in-stent restenosis in the bare-metal stent placed in the OM 2. He underwent successful PCI spanning the proximal circumflex stent across OM 1 through the bare-metal stent placed in OM 2 with a Xience alpine DES stent. He was also noted to have a widely patent stent in the RCA and proximal circumflex with otherwise mild-moderate disease. He has preserved LVEF with an ejection fraction of 55-65%. He was instructed to continue with DAPT with ASA + Effient .   He was admitted in September 2022 with acute respiratory failure and hypoxia. Patient intially was hypoxic to 80s and required bipap shortly after admission. Admission labs were notable for BNP of 145 and troponin levels that trended flat (8, 12 and 12). He had a normal CXR and RVP on admission.  Patient had no signs of a pulmonary embolism with lack of tachycardia hemoptysis and a well score 0.  CHF unlikely because patient was not fluid overloaded on exam.  Patient was given IV steroids, duo nebs, was placed on azithromycin . Echocardiogram was completed and showed left ventricular ejection fraction 60 to 65% with normal left ventricular function and mild left ventricular  hypertrophy.  Presence of grade 1 diastolic dysfunction. CTA showed no evidence of pulmonary embolism but did show evidence of consolidation/collapse of lateral right middle lobe. He was treated with antibiotics and steroids with improvement. Felt to be primarily a COPD exacerbation.   Patient reports he was hospitalized in [place] in June with respiratory distress/failure. He was intubated and on ventilator for the first day. Treated for bronchitis/COPD. States he got it from riding [employer] bus to see his mother.   He was recently admitted in early August with acute COPD exacerbation - treated with steroids and bronchodilators.   He was admitted in Jan with acute COPD exacerbation and GERD. Now on prilosec and Dulera . Followed by pulmonary   Was in the ED yesterday with COPD exacerbation. Given steroids and nebulizers with improvement. No chest pain.    Current Outpatient Medications  Medication Sig Dispense Refill   albuterol  (VENTOLIN  HFA) 108 (90 Base) MCG/ACT inhaler INHALE 2 PUFFS INTO THE LUNGS EVERY 6 HOURS AS NEEDED FOR WHEEZING OR FOR SHORTNESS OF BREATH 18 g 3   aspirin  81 MG chewable tablet Chew 1 tablet (81 mg total) by mouth daily. 180 tablet 2   atorvastatin  (LIPITOR ) 80 MG tablet TAKE 1 TABLET BY MOUTH DAILY 90 tablet 3   Blood Pressure Monitoring (BLOOD PRESSURE CUFF) MISC 1 kit by Does not apply route 2 (two) times a week. 1 each 0   carvedilol  (COREG ) 3.125 MG tablet TAKE 1 TABLET BY MOUTH TWICE A DAY WITH A MEAL 180 tablet 3  citalopram  (CELEXA ) 20 MG tablet Take 1 tablet (20 mg total) by mouth daily. 90 tablet 1   Continuous Glucose Receiver (FREESTYLE LIBRE 2 READER) DEVI Please use with [employer]. Dx: E11.9 1 each 0   Continuous Glucose Sensor (FREESTYLE LIBRE 2 PLUS SENSOR) MISC Change sensor every 15 days. 6 each 2   Continuous Glucose Sensor (FREESTYLE LIBRE 2 SENSOR) MISC APPLY SENSOR TO SKIN FOR CONTINUOUS BLOOD SUGAR MONITORING, REPLACE EVERY 14 DAYS 6  each 1   glucose blood (ACCU-CHEK GUIDE) test strip Use as instructed 100 each 12   hydrocortisone  2.5 % ointment Apply 1 Application topically.     insulin  glargine (LANTUS  SOLOSTAR) 100 UNIT/ML Solostar Pen Inject 20 Units into the skin daily.     Insulin  Pen Needle (DROPLET PEN NEEDLES) 29G X MISC Use to inject insulin  daily 100 each 1   Insulin  Pen Needle (PEN NEEDLES) 32G X 4 MM MISC Use as directed with insulin  four times daily 100 each 12   JARDIANCE  25 MG TABS tablet TAKE 1 TABLET BY MOUTH DAILY 90 tablet 3   ketoconazole  (NIZORAL ) 2 % cream Apply 1 Application topically daily. BETWEEN TOES for 1 week 15 g 0   lisinopril  (ZESTRIL ) 2.5 MG tablet Take 2.5 mg by mouth daily.     Melatonin 2.5 MG CAPS Take 5 mg by mouth at bedtime.     metFORMIN  (GLUCOPHAGE ) 1000 MG tablet TAKE 1 TABLET BY MOUTH TWICE A DAY WITH A MEAL 180 tablet 3   mometasone -formoterol  (DULERA ) 200-5 MCG/ACT AERO Inhale 2 puffs into the lungs in the morning and at bedtime. 39 g 0   Multiple Vitamin (MULTIVITAMIN) tablet Take 1 tablet by mouth daily.     nitroGLYCERIN  (NITROSTAT ) 0.4 MG SL tablet DISSOLVE 1 TABLET UNDER THE TONGUE FOR CHEST PAIN. IF PAIN REMAINS AFTER 5 MINUTES, CALL 911 AND REPEAT DOSE. MAX 3 TABLETS IN 15 MINUTES 100 tablet 0   NON FORMULARY Pt uses a c-pap nightly     nystatin  cream (MYCOSTATIN ) Apply topically.     Omega-3 Fatty Acids  (FISH OIL ) 1000 MG CAPS Take 1 capsule (1,000 mg total) by mouth daily.     omeprazole  (PRILOSEC) 20 MG capsule Take 1 capsule (20 mg total) by mouth 2 (two) times daily before a meal. 180 capsule 3   prasugrel  (EFFIENT ) 10 MG TABS tablet Take 1 tablet (10 mg total) by mouth daily. 90 tablet 2   predniSONE  (DELTASONE ) 10 MG tablet Take 2 tablets (20 mg total) by mouth daily. 10 tablet 0   Semaglutide , 2 MG/DOSE, (OZEMPIC , 2 MG/DOSE,) 8 MG/3ML SOPN Inject 2 mg into the skin once a week. Monday 9 mL 2   tolnaftate (TINACTIN) 1 % cream Apply 1 Application topically.      traMADol  (ULTRAM ) 50 MG tablet Take 50 mg by mouth every 12 (twelve) hours as needed for moderate pain (pain score 4-6).     triamcinolone  ointment (KENALOG ) 0.1 % Apply 1 Application topically 2 (two) times daily. 80 g 3   zinc  oxide 20 % ointment Apply 1 Application topically as needed for irritation. 56.7 g 0   No current facility-administered medications for this visit.    Allergies  Allergen Reactions   Shellfish Allergy Nausea And Vomiting    OYSTERS   Yellow Dye #6 (Sunset Yellow) Other (See Comments)    Gi intolerance     Social History   Socioeconomic History   Marital status: Single    Spouse name: Not  on file   Number of children: 0   Years of education: 15   Highest education level: Not on file  Occupational History   Occupation: Unemployed    Comment: Used to work for [employer]   Occupation: Consulting civil engineer, Insurance underwriter [employer]  Tobacco Use   Smoking status: Former    Current packs/day: 0.00    Average packs/day: 1 pack/day for 42.4 years (42.4 ttl pk-yrs)    Types: Cigarettes    Start date: 09/13/1975    Quit date: 01/25/2018    Years since quitting: 6.2   Smokeless tobacco: Never   Tobacco comments:    Bupropion  helping  Vaping Use   Vaping status: Never Used  Substance and Sexual Activity   Alcohol use: Not Currently    Comment: quit 3 yrs ago in 12/2020 per pt.   Drug use: No    Comment: hx Crack Cocaine ( 05/09/16)   Sexual activity: Not Currently  Other Topics Concern   Not on file  Social History Narrative   Single.  Lives with a roommate.  Ambulates independently.   Social Drivers of Corporate investment banker Strain: Not on file  Food Insecurity: No Food Insecurity (11/16/2023)   Hunger Vital Sign    Worried About Running Out of Food in the Last Year: Never true    Ran Out of Food in the Last Year: Never true  Transportation Needs: No Transportation Needs (11/16/2023)   PRAPARE - Administrator, Civil Service (Medical): No     Lack of Transportation (Non-Medical): No  Physical Activity: Not on file  Stress: Not on file  Social Connections: Not on file  Intimate Partner Violence: Not At Risk (11/16/2023)   Humiliation, Afraid, Rape, and Kick questionnaire    Fear of Current or Ex-Partner: No    Emotionally Abused: No    Physically Abused: No    Sexually Abused: No     Review of Systems: AS noted in HPI All other systems reviewed and are otherwise negative except as noted above.  Blood pressure 116/78, pulse 74, height 5' 7 (1.702 m), weight 282 lb 12.8 oz (128.3 kg), SpO2 97%.  GENERAL:  Well appearing obese WM in NAD HEENT:  PERRL, EOMI, sclera are clear. Oropharynx is clear. NECK:  No jugular venous distention, carotid upstroke brisk and symmetric, no bruits, no thyromegaly or adenopathy LUNGS:  Clear to auscultation bilaterally CHEST:  Unremarkable HEART:  RRR,  PMI not displaced or sustained,S1 and S2 within normal limits, no S3, no S4: no clicks, no rubs, no murmurs ABD:  Soft, nontender. BS +, no masses or bruits. No hepatomegaly, no splenomegaly EXT:  2 + pulses throughout, no edema, no cyanosis no clubbing SKIN:  Warm and dry.  No rashes NEURO:  Alert and oriented x 3. Cranial nerves II through XII intact. PSYCH:  Cognitively intact      Laboratory data:  Lab Results  Component Value Date   WBC 9.5 05/08/2024   HGB 12.5 (L) 05/08/2024   HCT 39.6 05/08/2024   PLT 283 05/08/2024   GLUCOSE 227 (H) 05/08/2024   CHOL 95 (L) 03/11/2024   TRIG 120 03/11/2024   HDL 33 (L) 03/11/2024   LDLDIRECT 70 09/02/2020   LDLCALC 40 03/11/2024   ALT 17 04/12/2023   AST 18 04/12/2023   NA 135 05/08/2024   K 3.8 05/08/2024   CL 102 05/08/2024   CREATININE 0.72 05/08/2024   BUN 14 05/08/2024   CO2  22 05/08/2024   TSH 1.100 01/18/2012   INR 0.96 07/28/2014   HGBA1C 8.4 (A) 02/21/2024   MICROALBUR 4.7 05/23/2016    Dated 03/11/24: cholesterol 95, triglycerides 120, HDL 33, LDL 40.   Echo 05/10/21:  IMPRESSIONS     1. Left ventricular ejection fraction, by estimation, is 60 to 65%. The  left ventricle has normal function. The left ventricle has no regional  wall motion abnormalities. There is mild left ventricular hypertrophy.  Left ventricular diastolic parameters  are consistent with Grade I diastolic dysfunction (impaired relaxation).   2. Right ventricular systolic function is normal. The right ventricular  size is normal. Tricuspid regurgitation signal is inadequate for assessing  PA pressure.   3. The mitral valve is normal in structure. No evidence of mitral valve  regurgitation. No evidence of mitral stenosis.   4. The aortic valve is tricuspid. Aortic valve regurgitation is not  visualized. Mild aortic valve sclerosis is present, with no evidence of  aortic valve stenosis.   5. The inferior vena cava is normal in size with <50% respiratory  variability, suggesting right atrial pressure of 8 mmHg.    ASSESSMENT AND PLAN:   1. CAD: Status post repeat PCI to the proximal circumflex and OM 2 in Nov. 2015  with DES for instent restenosis. No anginal symptoms.  Continue dual antiplatelet therapy with aspirin  plus Effient  indefinitely due to extensive nature of stents.  Continue coreg ,  statin and ACE inhibitor for secondary prevention.  2. Hypertension: Blood pressure is well-controlled. Continue lisinopril  and Coreg   3. Hyperlipidemia: on statin. Last LDL at goal 40  4. Diabetes: per primary care. Encourage weight loss and continued physical exercise. On multiple medications. He is aware that steroids may raise sugar.   5. Tobacco abuse: patient has quit. Congratulated.   6. COPD. Follow up with pulmonary  I will follow up in 6 months   Eston Heslin [place] MD,FACC  05/09/2024 2:43 PM

## 2024-05-07 ENCOUNTER — Other Ambulatory Visit: Payer: Self-pay | Admitting: Otolaryngology

## 2024-05-08 ENCOUNTER — Other Ambulatory Visit: Payer: Self-pay

## 2024-05-08 ENCOUNTER — Emergency Department (HOSPITAL_COMMUNITY)

## 2024-05-08 ENCOUNTER — Emergency Department (HOSPITAL_COMMUNITY)
Admission: EM | Admit: 2024-05-08 | Discharge: 2024-05-08 | Disposition: A | Discharge status: Home / Self Care | Attending: Emergency Medicine | Admitting: Emergency Medicine

## 2024-05-08 ENCOUNTER — Encounter (HOSPITAL_COMMUNITY): Payer: Self-pay

## 2024-05-08 DIAGNOSIS — R600 Localized edema: Secondary | ICD-10-CM | POA: Insufficient documentation

## 2024-05-08 DIAGNOSIS — J441 Chronic obstructive pulmonary disease with (acute) exacerbation: Secondary | ICD-10-CM | POA: Diagnosis not present

## 2024-05-08 DIAGNOSIS — Z7982 Long term (current) use of aspirin: Secondary | ICD-10-CM | POA: Diagnosis not present

## 2024-05-08 DIAGNOSIS — Z794 Long term (current) use of insulin: Secondary | ICD-10-CM | POA: Diagnosis not present

## 2024-05-08 DIAGNOSIS — R0602 Shortness of breath: Secondary | ICD-10-CM | POA: Diagnosis present

## 2024-05-08 LAB — BASIC METABOLIC PANEL WITH GFR
Anion gap: 11 (ref 5–15)
BUN: 14 mg/dL (ref 8–23)
CO2: 22 mmol/L (ref 22–32)
Calcium: 8.8 mg/dL — ABNORMAL LOW (ref 8.9–10.3)
Chloride: 102 mmol/L (ref 98–111)
Creatinine, Ser: 0.72 mg/dL (ref 0.61–1.24)
GFR, Estimated: >60 mL/min (ref >60)
Glucose, Bld: 227 mg/dL — ABNORMAL HIGH (ref 70–99)
Potassium: 3.8 mmol/L (ref 3.5–5.1)
Sodium: 135 mmol/L (ref 135–145)

## 2024-05-08 LAB — CBC WITH DIFFERENTIAL/PLATELET
Abs Immature Granulocytes: 0.05 K/uL (ref 0.00–0.07)
Basophils Absolute: 0.1 K/uL (ref 0.0–0.1)
Basophils Relative: 1 %
Eosinophils Absolute: 0.3 K/uL (ref 0.0–0.5)
Eosinophils Relative: 3 %
HCT: 39.6 % (ref 39.0–52.0)
Hemoglobin: 12.5 g/dL — ABNORMAL LOW (ref 13.0–17.0)
Immature Granulocytes: 1 %
Lymphocytes Relative: 23 %
Lymphs Abs: 2.2 K/uL (ref 0.7–4.0)
MCH: 25.9 pg — ABNORMAL LOW (ref 26.0–34.0)
MCHC: 31.6 g/dL (ref 30.0–36.0)
MCV: 82.2 fL (ref 80.0–100.0)
Monocytes Absolute: 0.6 K/uL (ref 0.1–1.0)
Monocytes Relative: 7 %
Neutro Abs: 6.3 K/uL (ref 1.7–7.7)
Neutrophils Relative %: 65 %
Platelets: 283 K/uL (ref 150–400)
RBC: 4.82 MIL/uL (ref 4.22–5.81)
RDW: 14.9 % (ref 11.5–15.5)
WBC: 9.5 K/uL (ref 4.0–10.5)
nRBC: 0 % (ref 0.0–0.2)

## 2024-05-08 LAB — SARS CORONAVIRUS 2 BY RT PCR: SARS Coronavirus 2 by RT PCR: NEGATIVE

## 2024-05-08 MED ORDER — PREDNISONE 10 MG PO TABS: 20.0000 mg | ORAL_TABLET | Freq: Every day | ORAL | 0 refills | Status: DC

## 2024-05-08 MED ORDER — ALBUTEROL SULFATE (2.5 MG/3ML) 0.083% IN NEBU
5.0000 mg | INHALATION_SOLUTION | Freq: Once | RESPIRATORY_TRACT | Status: AC
  Administered 2024-05-08: 5 mg via RESPIRATORY_TRACT
  Filled 2024-05-08: qty 6

## 2024-05-08 NOTE — ED Provider Notes (Signed)
 [place] EMERGENCY DEPARTMENT AT [facility] Provider Note   CSN: [id number] Arrival date & time: 05/08/24  1544     Patient presents with: Shortness of Breath   [name] [name] is a 63 y.o. male.    Shortness of Breath Patient presents with shortness of breath.  Has had around 4 hours.  Began while at the grocery store.  History of COPD.  Has been treated by EMS with 15 mg of albuterol  a milligram Atrovent  Solu-Medrol  and 2 g of mag.  Feeling somewhat better now.  No definite sick contacts.     Prior to Admission medications   Medication Sig Start Date End Date Taking? Authorizing Provider  predniSONE  (DELTASONE ) 10 MG tablet Take 2 tablets (20 mg total) by mouth daily. 05/08/24  Yes Patsey Lot, MD  albuterol  (VENTOLIN  HFA) 108 (90 Base) MCG/ACT inhaler INHALE 2 PUFFS INTO THE LUNGS EVERY 6 HOURS AS NEEDED FOR WHEEZING OR FOR SHORTNESS OF BREATH 03/27/24   Delores Suzann HERO, MD  aspirin  81 MG chewable tablet Chew 1 tablet (81 mg total) by mouth daily. 10/04/13   Fredirick Glenys RAMAN, MD  atorvastatin  (LIPITOR ) 80 MG tablet TAKE 1 TABLET BY MOUTH DAILY 04/24/24   Delores Suzann HERO, MD  Blood Pressure Monitoring (BLOOD PRESSURE CUFF) MISC 1 kit by Does not apply route 2 (two) times a week. 11/27/23   Alba Sharper, MD  carvedilol  (COREG ) 3.125 MG tablet TAKE 1 TABLET BY MOUTH TWICE A DAY WITH A MEAL 05/22/23   Chambliss, Layman CROME, MD  citalopram  (CELEXA ) 20 MG tablet Take 1 tablet (20 mg total) by mouth daily. 07/19/23   Jeanelle Layman CROME, MD  Continuous Glucose Receiver (FREESTYLE LIBRE 2 READER) DEVI Please use with Freestyle Libre Sensor. Dx: E11.9 01/19/24   Delores Suzann HERO, MD  Continuous Glucose Sensor (FREESTYLE LIBRE 2 PLUS SENSOR) MISC Change sensor every 15 days. 04/18/24   Delores Suzann HERO, MD  Continuous Glucose Sensor (FREESTYLE LIBRE 2 SENSOR) MISC APPLY SENSOR TO SKIN FOR CONTINUOUS BLOOD SUGAR MONITORING, REPLACE EVERY 14 DAYS 04/17/24   Delores Suzann HERO, MD  glucose  blood (ACCU-CHEK GUIDE) test strip Use as instructed 10/11/21   Jeanelle Layman CROME, MD  hydrocortisone  2.5 % ointment Apply 1 Application topically. 02/18/20   [provider]  insulin  glargine (LANTUS  SOLOSTAR) 100 UNIT/ML Solostar Pen Inject 20 Units into the skin daily. 02/21/24   Theophilus Pagan, MD  Insulin  Pen Needle (DROPLET PEN NEEDLES) 29G X MISC Use to inject insulin  daily 11/22/23   Delores Suzann HERO, MD  Insulin  Pen Needle (PEN NEEDLES) 32G X 4 MM MISC Use as directed with insulin  four times daily 09/15/21   Jeanelle Layman CROME, MD  JARDIANCE  25 MG TABS tablet TAKE 1 TABLET BY MOUTH DAILY 05/22/23   Jeanelle Layman CROME, MD  ketoconazole  (NIZORAL ) 2 % cream Apply 1 Application topically daily. BETWEEN TOES for 1 week 03/11/24   Delores Suzann HERO, MD  lisinopril  (ZESTRIL ) 2.5 MG tablet Take 2.5 mg by mouth daily.    [provider]  Melatonin 2.5 MG CAPS Take 5 mg by mouth at bedtime.    [provider]  metFORMIN  (GLUCOPHAGE ) 1000 MG tablet TAKE 1 TABLET BY MOUTH TWICE A DAY WITH A MEAL 01/25/24   Delores Suzann HERO, MD  mometasone -formoterol  (DULERA ) 200-5 MCG/ACT AERO Inhale 2 puffs into the lungs in the morning and at bedtime. 05/16/23   Jeanelle Layman CROME, MD  Multiple Vitamin (MULTIVITAMIN) tablet Take 1 tablet  by mouth daily.    [provider]  nitroGLYCERIN  (NITROSTAT ) 0.4 MG SL tablet DISSOLVE 1 TABLET UNDER THE TONGUE FOR CHEST PAIN. IF PAIN REMAINS AFTER 5 MINUTES, CALL 911 AND REPEAT DOSE. MAX 3 TABLETS IN 15 MINUTES 11/01/21   Chambliss, Layman CROME, MD  NON FORMULARY Pt uses a c-pap nightly    [provider]  nystatin  cream (MYCOSTATIN ) Apply topically. 02/17/16   [provider]  Omega-3 Fatty Acids  (FISH OIL ) 1000 MG CAPS Take 1 capsule (1,000 mg total) by mouth daily. 08/01/19   Scarlet Elsie LABOR, MD  omeprazole  (PRILOSEC) 20 MG capsule Take 1 capsule (20 mg total) by mouth 2 (two) times daily before a meal. 03/06/24 03/06/25   Mannam, Praveen, MD  prasugrel  (EFFIENT ) 10 MG TABS tablet Take 1 tablet (10 mg total) by mouth daily. 10/19/23   [place], Peter M, MD  Semaglutide , 2 MG/DOSE, (OZEMPIC , 2 MG/DOSE,) 8 MG/3ML SOPN Inject 2 mg into the skin once a week. Monday 11/10/23   Delores Suzann HERO, MD  tolnaftate (TINACTIN) 1 % cream Apply 1 Application topically. 08/12/23   [provider]  traMADol  (ULTRAM ) 50 MG tablet Take 50 mg by mouth every 12 (twelve) hours as needed for moderate pain (pain score 4-6).    [provider]  triamcinolone  ointment (KENALOG ) 0.1 % Apply 1 Application topically 2 (two) times daily. 03/11/24   Delores Suzann HERO, MD  zinc  oxide 20 % ointment Apply 1 Application topically as needed for irritation. 03/12/24   Delores Suzann HERO, MD    Allergies: Shellfish allergy and Yellow dye #6 (sunset yellow)    Review of Systems  Respiratory:  Positive for shortness of breath.     Updated Vital Signs BP 102/89   Pulse 86   Temp 97.8 F (36.6 C)   Resp 18   Ht 5' 7 (1.702 m)   Wt 124.7 kg   SpO2 92%   BMI 43.07 kg/m   Physical Exam Vitals reviewed.  Cardiovascular:     Rate and Rhythm: Normal rate and regular rhythm.  Pulmonary:     Comments: Harsh breath sounds with mild wheezes. Musculoskeletal:     Right lower leg: Edema present.     Left lower leg: Edema present.  Neurological:     Mental Status: He is alert and oriented to person, place, and time.     (all labs ordered are listed, but only abnormal results are displayed) Labs Reviewed  BASIC METABOLIC PANEL WITH GFR - Abnormal; Notable for the following components:      Result Value   Glucose, Bld 227 (*)    Calcium  8.8 (*)    All other components within normal limits  CBC WITH DIFFERENTIAL/PLATELET - Abnormal; Notable for the following components:   Hemoglobin 12.5 (*)    MCH 25.9 (*)    All other components within normal limits  SARS CORONAVIRUS 2 BY RT PCR    EKG: None  Radiology: DG Chest Portable 1  View Result Date: 05/08/2024 CLINICAL DATA:  Shortness of breath. Shortness of breath for 4 hours. Decreased oxygen saturation. EXAM: PORTABLE CHEST 1 VIEW COMPARISON:  09/20/2023 FINDINGS: Shallow inspiration. Cardiac enlargement with mild vascular congestion. No airspace disease or consolidation. No pleural effusion or pneumothorax. Mediastinal contours appear intact. IMPRESSION: Cardiac enlargement with mild pulmonary vascular congestion. No edema or consolidation. Electronically Signed   By: Elsie Gravely M.D.   On: 05/08/2024 16:49     Procedures   Medications Ordered in  the ED  albuterol  (PROVENTIL ) (2.5 MG/3ML) 0.083% nebulizer solution 5 mg (5 mg Nebulization Given 05/08/24 1718)    Clinical Course as of 05/08/24 2352  Wed May 08, 2024  1657 Patient states he is feeling wheezy again.  Will give another breathing treatment.  However he is talking loudly on the phone. [NP]    Clinical Course User Index [NP] Patsey Lot, MD                                 Medical Decision Making Amount and/or Complexity of Data Reviewed Labs: ordered. Radiology: ordered.  Risk Prescription drug management.   Patient shortness of breath.  History of COPD.  Feeling somewhat better after breathing treat.  Differential diagnosis would include viral infection but also COPD exacerbation/pneumonia.  Will get x-ray.  Will get blood work.  Will get COVID test.  Workup reassuring.  X-ray  Also reassuring.  Feeling better.  No longer hypoxic.  Eager to go home.  Will discharge.  Will give some steroids.  Likely COPD exacerbation.        Final diagnoses:  COPD exacerbation [facility])    ED Discharge Orders          Ordered    predniSONE  (DELTASONE ) 10 MG tablet  Daily        05/08/24 1820               Patsey Lot, MD 05/08/24 2352

## 2024-05-08 NOTE — ED Triage Notes (Signed)
 Pt BIB [facility] for SHOB x4 hours. Initial RA saturation 88%. O2 sats 96% on albuterol  nebulizer.  Pt received from EMS: 15 albuterol  1mg  atrovent  125 solumedrol 2g mag  EMS Vitals: 134/76 Hr 78 96% on nebulizer

## 2024-05-09 ENCOUNTER — Ambulatory Visit: Attending: Cardiology | Admitting: Cardiology

## 2024-05-09 ENCOUNTER — Encounter: Payer: Self-pay | Admitting: Cardiology

## 2024-05-09 VITALS — BP 116/78 | HR 74 | Ht 67.0 in | Wt 282.8 lb

## 2024-05-09 DIAGNOSIS — I10 Essential (primary) hypertension: Secondary | ICD-10-CM | POA: Diagnosis present

## 2024-05-09 DIAGNOSIS — I2510 Atherosclerotic heart disease of native coronary artery without angina pectoris: Secondary | ICD-10-CM | POA: Insufficient documentation

## 2024-05-09 DIAGNOSIS — Z794 Long term (current) use of insulin: Secondary | ICD-10-CM | POA: Insufficient documentation

## 2024-05-09 DIAGNOSIS — E782 Mixed hyperlipidemia: Secondary | ICD-10-CM | POA: Diagnosis present

## 2024-05-09 DIAGNOSIS — E1165 Type 2 diabetes mellitus with hyperglycemia: Secondary | ICD-10-CM | POA: Diagnosis present

## 2024-05-09 NOTE — Patient Instructions (Signed)
 Medication Instructions:  Continue same medications *If you need a refill on your cardiac medications before your next appointment, please call your pharmacy*  Lab Work: None ordered  Testing/Procedures: None ordered  Follow-Up: At [facility], you and your health needs are our priority.  As part of our continuing mission to provide you with exceptional heart care, our providers are all part of one team.  This team includes your primary Cardiologist (physician) and Advanced Practice Providers or APPs (Physician Assistants and Nurse Practitioners) who all work together to provide you with the care you need, when you need it.  Your next appointment:  6 months   Call in Oct to schedule Feb appointment     Provider:  Dr.Jordan   We recommend signing up for the patient portal called MyChart.  Sign up information is provided on this After Visit Summary.  MyChart is used to connect with patients for Virtual Visits (Telemedicine).  Patients are able to view lab/test results, encounter notes, upcoming appointments, etc.  Non-urgent messages can be sent to your provider as well.   To learn more about what you can do with MyChart, go to [email or web address].

## 2024-05-10 NOTE — Progress Notes (Addendum)
 Surgical Instructions   Your procedure is scheduled on Thursday, September 4th. Report to [facility] Main Entrance A at 10:00 A.M., then check in with the Admitting office. Any questions or running late day of surgery: call [phone]  Questions prior to your surgery date: call [phone], Monday-Friday, 8am-4pm. If you experience any cold or flu symptoms such as cough, fever, chills, shortness of breath, etc. between now and your scheduled surgery, please notify us  at the above number.     Remember:  Do not eat after midnight the night before your surgery   You may drink clear liquids until 9:00 the morning of your surgery.   Clear liquids allowed are: Water, Non-Citrus Juices (without pulp), Carbonated Beverages, Clear Tea (no milk, honey, etc.), Black Coffee Only (NO MILK, CREAM OR POWDERED CREAMER of any kind), and Gatorade.    Take these medicines the morning of surgery with A SIP OF WATER  atorvastatin  (LIPITOR )  carvedilol  (COREG )  citalopram  (CELEXA )  mometasone -formoterol  (DULERA ) inhaler  omeprazole  (PRILOSEC)  predniSONE  (DELTASONE )    May take these medicines IF NEEDED: albuterol  (VENTOLIN  HFA) inhaler - bring with you on day of surgery  nitroGLYCERIN  (NITROSTAT ) - call [phone] after taking this medication  traMADol  (ULTRAM )    Follow your surgeon's instructions on when to stop taking Asprin and prasugrel  (EFFIENT ).  If no instructions were given by your surgeon then you will need to call the office to get those instructions.     One week prior to surgery, STOP taking any Aleve, Naproxen, Ibuprofen, Motrin, Advil, Goody's, BC's, all herbal medications, fish oil , and non-prescription vitamins.          WHAT DO I DO ABOUT MY DIABETES MEDICATION?   Do not take oral diabetes medicines (metformin  (GLUCOPHAGE )) the morning of surgery. LAST DOSE: Wednesday, September 3rd.  Do not take JARDIANCE  for 72 hours prior to surgery.  LAST DOSE: Sunday, August  31st.       HOLD your Semaglutide  for 7 days prior to surgery.  LAST DOSE should be on or before Wednesday,  August 27th   THE MORNING OF SURGERY, take (50%) 10 units of insulin  glargine (LANTUS ) insulin .   HOW TO MANAGE YOUR DIABETES BEFORE AND AFTER SURGERY  Why is it important to control my blood sugar before and after surgery? Improving blood sugar levels before and after surgery helps healing and can limit problems. A way of improving blood sugar control is eating a healthy diet by:  Eating less sugar and carbohydrates  Increasing activity/exercise  Talking with your doctor about reaching your blood sugar goals High blood sugars (greater than 180 mg/dL) can raise your risk of infections and slow your recovery, so you will need to focus on controlling your diabetes during the weeks before surgery. Make sure that the doctor who takes care of your diabetes knows about your planned surgery including the date and location.  How do I manage my blood sugar before surgery? Check your blood sugar at least 4 times a day, starting 2 days before surgery, to make sure that the level is not too high or low.  Check your blood sugar the morning of your surgery when you wake up and every 2 hours until you get to the Short Stay unit.  If your blood sugar is less than 70 mg/dL, you will need to treat for low blood sugar: Do not take insulin . Treat a low blood sugar (less than 70 mg/dL) with  cup of clear juice (cranberry or apple),  4 glucose tablets, OR glucose gel. Recheck blood sugar in 15 minutes after treatment (to make sure it is greater than 70 mg/dL). If your blood sugar is not greater than 70 mg/dL on recheck, call [phone] for further instructions. Report your blood sugar to the short stay nurse when you get to Short Stay.  If you are admitted to the hospital after surgery: Your blood sugar will be checked by the staff and you will probably be given insulin  after surgery (instead of  oral diabetes medicines) to make sure you have good blood sugar levels. The goal for blood sugar control after surgery is 80-180 mg/dL.              Do NOT Smoke (Tobacco/Vaping) for 24 hours prior to your procedure.  If you use a CPAP at night, you may bring your mask/headgear for your overnight stay.   You will be asked to remove any contacts, glasses, piercing's, hearing aid's, dentures/partials prior to surgery. Please bring cases for these items if needed.    Patients discharged the day of surgery will not be allowed to drive home, and someone needs to stay with them for 24 hours.  SURGICAL WAITING ROOM VISITATION Patients may have no more than 2 support people in the waiting area - these visitors may rotate.   Pre-op nurse will coordinate an appropriate time for 1 ADULT support person, who may not rotate, to accompany patient in pre-op.  Children under the age of 26 must have an adult with them who is not the patient and must remain in the main waiting area with an adult.  If the patient needs to stay at the hospital during part of their recovery, the visitor guidelines for inpatient rooms apply.  Please refer to the [facility] website for the visitor guidelines for any additional information.   If you received a COVID test during your pre-op visit  it is requested that you wear a mask when out in public, stay away from anyone that may not be feeling well and notify your surgeon if you develop symptoms. If you have been in contact with anyone that has tested positive in the last 10 days please notify you surgeon.      Pre-operative CHG Bathing Instructions   You can play a key role in reducing the risk of infection after surgery. Your skin needs to be as free of germs as possible. You can reduce the number of germs on your skin by washing with CHG (chlorhexidine  gluconate) soap before surgery. CHG is an antiseptic soap that kills germs and continues to kill germs even after  washing.   DO NOT use if you have an allergy to chlorhexidine /CHG or antibacterial soaps. If your skin becomes reddened or irritated, stop using the CHG and notify one of our RNs at [phone].              TAKE A SHOWER THE NIGHT BEFORE SURGERY AND THE DAY OF SURGERY    Please keep in mind the following:  DO NOT shave, including legs and underarms, 48 hours prior to surgery.   You may shave your face before/day of surgery.  Place clean sheets on your bed the night before surgery Use a clean washcloth (not used since being washed) for each shower. DO NOT sleep with pet's night before surgery.  CHG Shower Instructions:  Wash your face and private area with normal soap. If you choose to wash your hair, wash first with your normal shampoo.  After you use shampoo/soap, rinse your hair and body thoroughly to remove shampoo/soap residue.  Turn the water OFF and apply half the bottle of CHG soap to a CLEAN washcloth.  Apply CHG soap ONLY FROM YOUR NECK DOWN TO YOUR TOES (washing for 3-5 minutes)  DO NOT use CHG soap on face, private areas, open wounds, or sores.  Pay special attention to the area where your surgery is being performed.  If you are having back surgery, having someone wash your back for you may be helpful. Wait 2 minutes after CHG soap is applied, then you may rinse off the CHG soap.  Pat dry with a clean towel  Put on clean pajamas    Additional instructions for the day of surgery: DO NOT APPLY any lotions, deodorants, cologne, or perfumes.   Do not wear jewelry or makeup Do not wear nail polish, gel polish, artificial nails, or any other type of covering on natural nails (fingers and toes) Do not bring valuables to the hospital. [facility] is not responsible for valuables/personal belongings. Put on clean/comfortable clothes.  Please brush your teeth.  Ask your nurse before applying any prescription medications to the skin.

## 2024-05-14 ENCOUNTER — Other Ambulatory Visit: Payer: Self-pay

## 2024-05-14 ENCOUNTER — Encounter (HOSPITAL_COMMUNITY)
Admission: RE | Admit: 2024-05-14 | Discharge: 2024-05-14 | Disposition: A | Discharge status: Home / Self Care | Source: Ambulatory Visit | Attending: Otolaryngology | Admitting: Otolaryngology

## 2024-05-14 ENCOUNTER — Encounter (HOSPITAL_COMMUNITY): Payer: Self-pay

## 2024-05-14 VITALS — BP 119/55 | HR 76 | Temp 98.6°F | Resp 18 | Ht 67.0 in | Wt 273.0 lb

## 2024-05-14 DIAGNOSIS — Z01812 Encounter for preprocedural laboratory examination: Secondary | ICD-10-CM | POA: Diagnosis present

## 2024-05-14 DIAGNOSIS — E119 Type 2 diabetes mellitus without complications: Secondary | ICD-10-CM | POA: Insufficient documentation

## 2024-05-14 DIAGNOSIS — Z794 Long term (current) use of insulin: Secondary | ICD-10-CM | POA: Insufficient documentation

## 2024-05-14 LAB — HEMOGLOBIN A1C
Hgb A1c MFr Bld: 9.2 % — ABNORMAL HIGH (ref 4.8–5.6)
Mean Plasma Glucose: 217.34 mg/dL

## 2024-05-14 LAB — GLUCOSE, CAPILLARY: Glucose-Capillary: 264 mg/dL — ABNORMAL HIGH (ref 70–99)

## 2024-05-14 NOTE — Progress Notes (Signed)
 Pt's A1c is 9.2. Lynwood, [place] is notified.

## 2024-05-14 NOTE — Progress Notes (Addendum)
 PCP - Delores Fought Cardiologist - [place], Peter Pulmonologist - Lonna Coder  PPM/ICD - denies Device Orders - n/a Rep Notified - n/a  Chest x-ray - 05/08/2024 EKG - 02/08/2024 Stress Test - 07/21/2014 ECHO - 05/10/2021 Cardiac Cath - 11/07/2013  Sleep Study - yes CPAP - yes  Fasting Blood Sugar - 125-150, 264 at PAT . Pt had a [place] sandwich and an orange 1 hour ago. Also on steroids (last dose will be tmrw, 05/15/2024)  Checks Blood Sugar twice a day  Last dose of GLP1 agonist-  05/13/2024, Lynwood, [place] is aware  GLP1 instructions: hold 1 week prior to procedure.   Blood Thinner Instructions:  Aspirin  Instructions: call dr. Carlie' office for instructions  ERAS Protcol -yes; till 0900 PRE-SURGERY Ensure or G2- no  COVID TEST- n/a   Anesthesia review: yes, cardiac history, COPD. Recent visit to ED. Lyn[place], [place] is aware  Patient denies shortness of breath, fever, cough and chest pain at PAT appointment   All instructions explained to the patient, with a verbal understanding of the material. Patient agrees to go over the instructions while at home for a better understanding. Patient also instructed to self quarantine after being tested for COVID-19. The opportunity to ask questions was provided.

## 2024-05-14 NOTE — Pre-Procedure Instructions (Signed)
 Surgical Instructions     Your procedure is scheduled on Thursday, September 4th. Report to [facility] Main Entrance A at 10:00 A.M., then check in with the Admitting office. Any questions or running late day of surgery: call [phone]   Questions prior to your surgery date: call [phone], Monday-Friday, 8am-4pm. If you experience any cold or flu symptoms such as cough, fever, chills, shortness of breath, etc. between now and your scheduled surgery, please notify us  at the above number.            Remember:       Do not eat after midnight the night before your surgery     You may drink clear liquids until 9:00 the morning of your surgery.   Clear liquids allowed are: Water, Non-Citrus Juices (without pulp), Carbonated Beverages, Clear Tea (no milk, honey, etc.), Black Coffee Only (NO MILK, CREAM OR POWDERED CREAMER of any kind), and Gatorade.          Take these medicines the morning of surgery with A SIP OF WATER   carvedilol  (COREG )  citalopram  (CELEXA )  mometasone -formoterol  (DULERA ) inhaler  omeprazole  (PRILOSEC)  predniSONE  (DELTASONE )      May take these medicines IF NEEDED: albuterol  (VENTOLIN  HFA) inhaler - bring with you on day of surgery  nitroGLYCERIN  (NITROSTAT ) - call [phone] after taking this medication  traMADol  (ULTRAM )      Follow your surgeon's instructions on when to stop taking Asprin and prasugrel  (EFFIENT ).  If no instructions were given by your surgeon then you will need to call the office to get those instructions.       One week prior to surgery, STOP taking any Aleve, Naproxen, Ibuprofen, Motrin, Advil, Goody's, BC's, all herbal medications, fish oil , and non-prescription vitamins.          WHAT DO I DO ABOUT MY DIABETES MEDICATION?     Do not take oral diabetes medicines (metformin  (GLUCOPHAGE )) the morning of surgery. LAST DOSE: Wednesday, September 3rd.   Do not take JARDIANCE  for 72 hours prior to surgery.  LAST DOSE: Sunday,  August 31st.                                          HOLD your Semaglutide  for 7 days prior to surgery.  LAST DOSE should be on or before Wednesday,  August 27th    THE MORNING OF SURGERY, take (50%) 10 units of insulin  glargine (LANTUS ) insulin .     HOW TO MANAGE YOUR DIABETES BEFORE AND AFTER SURGERY   Why is it important to control my blood sugar before and after surgery? Improving blood sugar levels before and after surgery helps healing and can limit problems. A way of improving blood sugar control is eating a healthy diet by:  Eating less sugar and carbohydrates  Increasing activity/exercise  Talking with your doctor about reaching your blood sugar goals High blood sugars (greater than 180 mg/dL) can raise your risk of infections and slow your recovery, so you will need to focus on controlling your diabetes during the weeks before surgery. Make sure that the doctor who takes care of your diabetes knows about your planned surgery including the date and location.   How do I manage my blood sugar before surgery? Check your blood sugar at least 4 times a day, starting 2 days before surgery, to make sure that the level is not too  high or low.   Check your blood sugar the morning of your surgery when you wake up and every 2 hours until you get to the Short Stay unit.   If your blood sugar is less than 70 mg/dL, you will need to treat for low blood sugar: Do not take insulin . Treat a low blood sugar (less than 70 mg/dL) with  cup of clear juice (cranberry or apple), 4 glucose tablets, OR glucose gel. Recheck blood sugar in 15 minutes after treatment (to make sure it is greater than 70 mg/dL). If your blood sugar is not greater than 70 mg/dL on recheck, call [phone] for further instructions. Report your blood sugar to the short stay nurse when you get to Short Stay.   If you are admitted to the hospital after surgery: Your blood sugar will be checked by the staff and you will  probably be given insulin  after surgery (instead of oral diabetes medicines) to make sure you have good blood sugar levels. The goal for blood sugar control after surgery is 80-180 mg/dL.                Do NOT Smoke (Tobacco/Vaping) for 24 hours prior to your procedure.   If you use a CPAP at night, you may bring your mask/headgear for your overnight stay.   You will be asked to remove any contacts, glasses, piercing's, hearing aid's, dentures/partials prior to surgery. Please bring cases for these items if needed.    Patients discharged the day of surgery will not be allowed to drive home, and someone needs to stay with them for 24 hours.   SURGICAL WAITING ROOM VISITATION Patients may have no more than 2 support people in the waiting area - these visitors may rotate.   Pre-op nurse will coordinate an appropriate time for 1 ADULT support person, who may not rotate, to accompany patient in pre-op.  Children under the age of 64 must have an adult with them who is not the patient and must remain in the main waiting area with an adult.   If the patient needs to stay at the hospital during part of their recovery, the visitor guidelines for inpatient rooms apply.   Please refer to the [facility] website for the visitor guidelines for any additional information.     If you received a COVID test during your pre-op visit  it is requested that you wear a mask when out in public, stay away from anyone that may not be feeling well and notify your surgeon if you develop symptoms. If you have been in contact with anyone that has tested positive in the last 10 days please notify you surgeon.         Pre-operative CHG Bathing Instructions    You can play a key role in reducing the risk of infection after surgery. Your skin needs to be as free of germs as possible. You can reduce the number of germs on your skin by washing with CHG (chlorhexidine  gluconate) soap before surgery. CHG is an antiseptic  soap that kills germs and continues to kill germs even after washing.    DO NOT use if you have an allergy to chlorhexidine /CHG or antibacterial soaps. If your skin becomes reddened or irritated, stop using the CHG and notify one of our RNs at [phone].               TAKE A SHOWER THE NIGHT BEFORE SURGERY AND THE DAY OF SURGERY  Please keep in mind the following:  DO NOT shave, including legs and underarms, 48 hours prior to surgery.   You may shave your face before/day of surgery.  Place clean sheets on your bed the night before surgery Use a clean washcloth (not used since being washed) for each shower. DO NOT sleep with pet's night before surgery.   CHG Shower Instructions:  Wash your face and private area with normal soap. If you choose to wash your hair, wash first with your normal shampoo.  After you use shampoo/soap, rinse your hair and body thoroughly to remove shampoo/soap residue.  Turn the water OFF and apply half the bottle of CHG soap to a CLEAN washcloth.  Apply CHG soap ONLY FROM YOUR NECK DOWN TO YOUR TOES (washing for 3-5 minutes)  DO NOT use CHG soap on face, private areas, open wounds, or sores.  Pay special attention to the area where your surgery is being performed.  If you are having back surgery, having someone wash your back for you may be helpful. Wait 2 minutes after CHG soap is applied, then you may rinse off the CHG soap.  Pat dry with a clean towel  Put on clean pajamas     Additional instructions for the day of surgery: DO NOT APPLY any lotions, deodorants, cologne, or perfumes.   Do not wear jewelry or makeup Do not wear nail polish, gel polish, artificial nails, or any other type of covering on natural nails (fingers and toes) Do not bring valuables to the hospital. [facility] is not responsible for valuables/personal belongings. Put on clean/comfortable clothes.  Please brush your teeth.  Ask your nurse before applying any prescription  medications to the skin.

## 2024-05-15 NOTE — Progress Notes (Signed)
 Patient called to ask about whether to take or hold his ASA 81 mg and his Effient . He said he had called Dr. Ilona office for instructions but had not received an answer. I told patient to only take the medicines listed on his instructions given at his PAT appointment on the day of surgery. I called Dr. Ilona office and left a voice message with his nurse, Raben, notifying her that patient was calling office regarding instructions for his blood thinner and aspirin  prior to his surgery on 9/4.Ileft my return number [phone] and the PAT office number [phone] if clarification is needed.

## 2024-05-15 NOTE — Telephone Encounter (Signed)
 Left voicemail stating that we would need to cancel surgery for tomorrow due to clearance instructions. I left my direct number for him to call me back. Canceled surgery at hospital as well.

## 2024-05-16 ENCOUNTER — Other Ambulatory Visit: Payer: Self-pay

## 2024-05-16 ENCOUNTER — Encounter (HOSPITAL_COMMUNITY): Admission: RE | Payer: Self-pay | Source: Home / Self Care

## 2024-05-16 ENCOUNTER — Emergency Department (HOSPITAL_COMMUNITY)

## 2024-05-16 ENCOUNTER — Ambulatory Visit (HOSPITAL_COMMUNITY): Admission: RE | Admit: 2024-05-16 | Source: Home / Self Care | Admitting: Otolaryngology

## 2024-05-16 ENCOUNTER — Encounter (HOSPITAL_COMMUNITY): Payer: Self-pay | Admitting: Family Medicine

## 2024-05-16 ENCOUNTER — Observation Stay (HOSPITAL_COMMUNITY)
Admission: EM | Admit: 2024-05-16 | Discharge: 2024-05-17 | Disposition: A | Discharge status: Home / Self Care | Attending: Family Medicine | Admitting: Family Medicine

## 2024-05-16 DIAGNOSIS — I2510 Atherosclerotic heart disease of native coronary artery without angina pectoris: Secondary | ICD-10-CM | POA: Diagnosis not present

## 2024-05-16 DIAGNOSIS — J441 Chronic obstructive pulmonary disease with (acute) exacerbation: Principal | ICD-10-CM | POA: Diagnosis present

## 2024-05-16 DIAGNOSIS — R0602 Shortness of breath: Secondary | ICD-10-CM | POA: Diagnosis present

## 2024-05-16 DIAGNOSIS — Z955 Presence of coronary angioplasty implant and graft: Secondary | ICD-10-CM | POA: Diagnosis not present

## 2024-05-16 DIAGNOSIS — I10 Essential (primary) hypertension: Secondary | ICD-10-CM | POA: Insufficient documentation

## 2024-05-16 DIAGNOSIS — Z79899 Other long term (current) drug therapy: Secondary | ICD-10-CM | POA: Insufficient documentation

## 2024-05-16 DIAGNOSIS — Z7984 Long term (current) use of oral hypoglycemic drugs: Secondary | ICD-10-CM | POA: Insufficient documentation

## 2024-05-16 DIAGNOSIS — E1165 Type 2 diabetes mellitus with hyperglycemia: Secondary | ICD-10-CM

## 2024-05-16 DIAGNOSIS — Z794 Long term (current) use of insulin: Secondary | ICD-10-CM | POA: Diagnosis not present

## 2024-05-16 DIAGNOSIS — Z23 Encounter for immunization: Secondary | ICD-10-CM | POA: Diagnosis not present

## 2024-05-16 DIAGNOSIS — Z7982 Long term (current) use of aspirin: Secondary | ICD-10-CM | POA: Diagnosis not present

## 2024-05-16 DIAGNOSIS — E119 Type 2 diabetes mellitus without complications: Secondary | ICD-10-CM | POA: Insufficient documentation

## 2024-05-16 LAB — CBC WITH DIFFERENTIAL/PLATELET
Abs Immature Granulocytes: 0.09 K/uL — ABNORMAL HIGH (ref 0.00–0.07)
Basophils Absolute: 0.1 K/uL (ref 0.0–0.1)
Basophils Relative: 1 %
Eosinophils Absolute: 0.4 K/uL (ref 0.0–0.5)
Eosinophils Relative: 4 %
HCT: 39.3 % (ref 39.0–52.0)
Hemoglobin: 12.1 g/dL — ABNORMAL LOW (ref 13.0–17.0)
Immature Granulocytes: 1 %
Lymphocytes Relative: 36 %
Lymphs Abs: 4 K/uL (ref 0.7–4.0)
MCH: 25.6 pg — ABNORMAL LOW (ref 26.0–34.0)
MCHC: 30.8 g/dL (ref 30.0–36.0)
MCV: 83.3 fL (ref 80.0–100.0)
Monocytes Absolute: 0.8 K/uL (ref 0.1–1.0)
Monocytes Relative: 8 %
Neutro Abs: 5.7 K/uL (ref 1.7–7.7)
Neutrophils Relative %: 50 %
Platelets: 298 K/uL (ref 150–400)
RBC: 4.72 MIL/uL (ref 4.22–5.81)
RDW: 15.4 % (ref 11.5–15.5)
WBC: 11.2 K/uL — ABNORMAL HIGH (ref 4.0–10.5)
nRBC: 0 % (ref 0.0–0.2)

## 2024-05-16 LAB — GLUCOSE, CAPILLARY
Glucose-Capillary: 329 mg/dL — ABNORMAL HIGH (ref 70–99)
Glucose-Capillary: 319 mg/dL — ABNORMAL HIGH (ref 70–99)
Glucose-Capillary: 215 mg/dL — ABNORMAL HIGH (ref 70–99)

## 2024-05-16 LAB — COMPREHENSIVE METABOLIC PANEL WITH GFR
ALT: 16 U/L (ref 0–44)
AST: 20 U/L (ref 15–41)
Albumin: 3.3 g/dL — ABNORMAL LOW (ref 3.5–5.0)
Alkaline Phosphatase: 89 U/L (ref 38–126)
Anion gap: 11 (ref 5–15)
BUN: 15 mg/dL (ref 8–23)
CO2: 23 mmol/L (ref 22–32)
Calcium: 8.5 mg/dL — ABNORMAL LOW (ref 8.9–10.3)
Chloride: 101 mmol/L (ref 98–111)
Creatinine, Ser: 0.73 mg/dL (ref 0.61–1.24)
GFR, Estimated: >60 mL/min (ref >60)
Glucose, Bld: 244 mg/dL — ABNORMAL HIGH (ref 70–99)
Potassium: 3.8 mmol/L (ref 3.5–5.1)
Sodium: 135 mmol/L (ref 135–145)
Total Bilirubin: 0.7 mg/dL (ref 0.0–1.2)
Total Protein: 6.5 g/dL (ref 6.5–8.1)

## 2024-05-16 LAB — RESPIRATORY PANEL BY PCR

## 2024-05-16 LAB — CBG MONITORING, ED: Glucose-Capillary: 299 mg/dL — ABNORMAL HIGH (ref 70–99)

## 2024-05-16 LAB — BRAIN NATRIURETIC PEPTIDE: B Natriuretic Peptide: 29.3 pg/mL (ref 0.0–100.0)

## 2024-05-16 LAB — D-DIMER, QUANTITATIVE: D-Dimer, Quant: <0.27 ug{FEU}/mL (ref 0.00–0.50)

## 2024-05-16 LAB — TROPONIN I (HIGH SENSITIVITY)
Troponin I (High Sensitivity): 5 ng/L (ref <18)
Troponin I (High Sensitivity): 5 ng/L (ref <18)

## 2024-05-16 SURGERY — TYMPANOPLASTY, WITH MASTOIDECTOMY
Anesthesia: General | Laterality: Right

## 2024-05-16 MED ORDER — EMPAGLIFLOZIN 25 MG PO TABS
25.0000 mg | ORAL_TABLET | Freq: Every day | ORAL | Status: DC
  Administered 2024-05-16 – 2024-05-17 (×2): 25 mg via ORAL
  Filled 2024-05-16 (×3): qty 1

## 2024-05-16 MED ORDER — INSULIN GLARGINE 100 UNIT/ML ~~LOC~~ SOLN
10.0000 [IU] | Freq: Once | SUBCUTANEOUS | Status: AC
  Administered 2024-05-16: 10 [IU] via SUBCUTANEOUS
  Filled 2024-05-16: qty 0.1

## 2024-05-16 MED ORDER — ATORVASTATIN CALCIUM 80 MG PO TABS
80.0000 mg | ORAL_TABLET | Freq: Every day | ORAL | Status: DC
  Administered 2024-05-16 – 2024-05-17 (×2): 80 mg via ORAL
  Filled 2024-05-16: qty 1
  Filled 2024-05-16: qty 2

## 2024-05-16 MED ORDER — INFLUENZA VIRUS VACC SPLIT PF (FLUZONE) 0.5 ML IM SUSY
0.5000 mL | PREFILLED_SYRINGE | INTRAMUSCULAR | Status: AC
  Administered 2024-05-17: 0.5 mL via INTRAMUSCULAR
  Filled 2024-05-16: qty 0.5

## 2024-05-16 MED ORDER — DOXYCYCLINE HYCLATE 100 MG PO TABS
100.0000 mg | ORAL_TABLET | Freq: Two times a day (BID) | ORAL | Status: DC
  Administered 2024-05-16 – 2024-05-17 (×3): 100 mg via ORAL
  Filled 2024-05-16 (×3): qty 1

## 2024-05-16 MED ORDER — IPRATROPIUM BROMIDE 0.02 % IN SOLN
0.5000 mg | Freq: Once | RESPIRATORY_TRACT | Status: AC
  Administered 2024-05-16: 0.5 mg via RESPIRATORY_TRACT
  Filled 2024-05-16: qty 2.5

## 2024-05-16 MED ORDER — PREDNISONE 5 MG PO TABS: 50.0000 mg | ORAL_TABLET | Freq: Every day | ORAL | Status: DC

## 2024-05-16 MED ORDER — ENOXAPARIN SODIUM 60 MG/0.6ML IJ SOSY
60.0000 mg | PREFILLED_SYRINGE | INTRAMUSCULAR | Status: DC
  Administered 2024-05-17: 60 mg via SUBCUTANEOUS
  Filled 2024-05-16: qty 0.6

## 2024-05-16 MED ORDER — PRASUGREL HCL 10 MG PO TABS
10.0000 mg | ORAL_TABLET | Freq: Every day | ORAL | Status: DC
  Administered 2024-05-16 – 2024-05-17 (×2): 10 mg via ORAL
  Filled 2024-05-16 (×3): qty 1

## 2024-05-16 MED ORDER — PANTOPRAZOLE SODIUM 40 MG PO TBEC
40.0000 mg | DELAYED_RELEASE_TABLET | Freq: Every day | ORAL | Status: DC
  Administered 2024-05-16 – 2024-05-17 (×2): 40 mg via ORAL
  Filled 2024-05-16: qty 1

## 2024-05-16 MED ORDER — IPRATROPIUM-ALBUTEROL 0.5-2.5 (3) MG/3ML IN SOLN
3.0000 mL | RESPIRATORY_TRACT | Status: DC
  Administered 2024-05-16 (×2): 3 mL via RESPIRATORY_TRACT
  Filled 2024-05-16 (×2): qty 3

## 2024-05-16 MED ORDER — CARVEDILOL 3.125 MG PO TABS
3.1250 mg | ORAL_TABLET | Freq: Two times a day (BID) | ORAL | Status: DC
  Administered 2024-05-16 – 2024-05-17 (×3): 3.125 mg via ORAL
  Filled 2024-05-16 (×3): qty 1

## 2024-05-16 MED ORDER — IPRATROPIUM-ALBUTEROL 0.5-2.5 (3) MG/3ML IN SOLN: 3.0000 mL | RESPIRATORY_TRACT | Status: DC | PRN

## 2024-05-16 MED ORDER — PREDNISONE 20 MG PO TABS
50.0000 mg | ORAL_TABLET | Freq: Every day | ORAL | Status: DC
  Administered 2024-05-17: 50 mg via ORAL
  Filled 2024-05-16: qty 1

## 2024-05-16 MED ORDER — IPRATROPIUM-ALBUTEROL 0.5-2.5 (3) MG/3ML IN SOLN
3.0000 mL | Freq: Four times a day (QID) | RESPIRATORY_TRACT | Status: DC
  Administered 2024-05-16 – 2024-05-17 (×3): 3 mL via RESPIRATORY_TRACT
  Filled 2024-05-16 (×3): qty 3

## 2024-05-16 MED ORDER — CITALOPRAM HYDROBROMIDE 20 MG PO TABS
20.0000 mg | ORAL_TABLET | Freq: Every day | ORAL | Status: DC
  Administered 2024-05-16 – 2024-05-17 (×2): 20 mg via ORAL
  Filled 2024-05-16: qty 1
  Filled 2024-05-16: qty 2

## 2024-05-16 MED ORDER — INSULIN GLARGINE 100 UNIT/ML ~~LOC~~ SOLN
30.0000 [IU] | Freq: Every day | SUBCUTANEOUS | Status: DC
  Administered 2024-05-17: 30 [IU] via SUBCUTANEOUS
  Filled 2024-05-16: qty 0.3

## 2024-05-16 MED ORDER — ASPIRIN 81 MG PO CHEW
81.0000 mg | CHEWABLE_TABLET | Freq: Every day | ORAL | Status: DC
  Administered 2024-05-16 – 2024-05-17 (×2): 81 mg via ORAL
  Filled 2024-05-16 (×2): qty 1

## 2024-05-16 MED ORDER — IPRATROPIUM-ALBUTEROL 0.5-2.5 (3) MG/3ML IN SOLN
3.0000 mL | Freq: Once | RESPIRATORY_TRACT | Status: AC
  Administered 2024-05-16: 3 mL via RESPIRATORY_TRACT
  Filled 2024-05-16: qty 3

## 2024-05-16 MED ORDER — ALBUTEROL SULFATE (2.5 MG/3ML) 0.083% IN NEBU
10.0000 mg | INHALATION_SOLUTION | Freq: Once | RESPIRATORY_TRACT | Status: AC
  Administered 2024-05-16: 10 mg via RESPIRATORY_TRACT
  Filled 2024-05-16: qty 12

## 2024-05-16 MED ORDER — INSULIN ASPART 100 UNIT/ML IJ SOLN
0.0000 [IU] | Freq: Every day | INTRAMUSCULAR | Status: DC
  Administered 2024-05-16: 2 [IU] via SUBCUTANEOUS

## 2024-05-16 MED ORDER — ENOXAPARIN SODIUM 40 MG/0.4ML IJ SOSY
40.0000 mg | PREFILLED_SYRINGE | INTRAMUSCULAR | Status: DC
  Administered 2024-05-16: 40 mg via SUBCUTANEOUS
  Filled 2024-05-16: qty 0.4

## 2024-05-16 MED ORDER — INSULIN ASPART 100 UNIT/ML IJ SOLN
0.0000 [IU] | Freq: Three times a day (TID) | INTRAMUSCULAR | Status: DC
  Administered 2024-05-16: 7 [IU] via SUBCUTANEOUS
  Administered 2024-05-16 (×2): 15 [IU] via SUBCUTANEOUS
  Administered 2024-05-17: 4 [IU] via SUBCUTANEOUS
  Administered 2024-05-17: 7 [IU] via SUBCUTANEOUS

## 2024-05-16 MED ORDER — INSULIN GLARGINE 100 UNIT/ML ~~LOC~~ SOLN
20.0000 [IU] | Freq: Every day | SUBCUTANEOUS | Status: DC
  Administered 2024-05-16: 20 [IU] via SUBCUTANEOUS
  Filled 2024-05-16 (×2): qty 0.2

## 2024-05-16 NOTE — ED Notes (Signed)
 PT ambulated to restroom with his Pulse Ox maintaining between 91-93% and his heart rate between 78 and 82%

## 2024-05-16 NOTE — Inpatient Diabetes Management (Signed)
 Inpatient Diabetes Program Recommendations  AACE/ADA: New Consensus Statement on Inpatient Glycemic Control (2015)  Target Ranges:  Prepandial:   less than 140 mg/dL      Peak postprandial:   less than 180 mg/dL (1-2 hours)      Critically ill patients:  140 - 180 mg/dL   Lab Results  Component Value Date   GLUCAP 299 (H) 05/16/2024   HGBA1C 9.2 (H) 05/14/2024    Review of Glycemic Control   Latest Reference Range & Units 05/14/24 11:06 05/16/24 08:21  Glucose-Capillary 70 - 99 mg/dL 735 (H) 700 (H)    Diabetes history: DM 2 Outpatient Diabetes medications: Lantus  20 units Daily, Jardiace 25 mg Daily, Metformin  1000 mg Daily, Ozempic  2 mg Weekly Current orders for Inpatient glycemic control:  Lantus  20 units  Novolog  0-20 units tid + hs  PO prednisone  50 mg Daily A1c 9.2% on 9/2 (note pt recently in ED for COPD exacerbation on 8/27 was given prednisone  at that time) on prednisone  again this admission.  Watch on current regimen.  Thanks,  Clotilda Bull RN, MSN, BC-ADM Inpatient Diabetes Coordinator Team Pager [phone] (8a-5p)

## 2024-05-16 NOTE — H&P (Addendum)
 Hospital Admission History and Physical Service Pager: [phone]  Patient name: [name] [name] Medical record number: [id number] Date of Birth: Jun 13, 1961 Age: 63 y.o. Gender: male  Primary Care Provider: Delores Fought, MD  Consultants: None  Code Status: Full code Preferred Emergency Contact: *Prefer to contact mother Contact Information     Name Relation Home Work [place] Sister [phone]     Day,Rheanell Mother   [phone]   Day,John Relative   [phone]   Hyles,Pam Sister   [phone]      Other Contacts   None on File      Chief Complaint: Dyspnea  Differential and Medical Decision Making:  [name] [name] is a 63 y.o. male with PMHx COPD, OSA, CAD, T2DM, HLD, HTN and chronic mastoiditis presenting with SOB.   Differential for this patient's presentation of this includes COPD exacerbation (most likely given history, physical exam findings, and worsening with finishing of steroids and running out of his Dulera ), pneumonia (less likely given chest x-ray, afebrile, and physical exam), MI (possible given prior history for multiple stents but unlikely given EKG and troponins), PE (less likely given normal D-dimer) and allergic reaction (occurred after CHX use but no there edema or rash component, less likely) Assessment & Plan COPD exacerbation (HCC) Wheezing throughout but no respiratory distress upon exam. Currently on CAT, will wean to scheduled breathing treatments. S/p Solu-Medrol , magnesium , and 2 DuoNebs by EMS.  - FMTS will admit, attending Dr. Donah  - DuoNebs every 4 hours with q2h prn breakthrough - Doxycycline  100 mg every 12 hours x 5 days - Prednisone  50 mg x 4 days - Respiratory panel - Wean oxygen as tolerated - Consider escalation to triple therapy inhaler upon discharge Type 2 diabetes mellitus (HCC) A1c 9.2, will need additional management in the setting of steroid use as above. - Resistant SSI with CBGs - Continue home Lantus  20  units daily   Chronic and Stable Conditions: HLD - Continue home Lipitor  80 mg CAD - Continue home aspirin  81 mg, carvedilol  3.125 mg, parasugrel 10 mg Anxiety - Continue home citalopram  20 mg OSA - Continue home CPAP nightly  FEN/GI: Heart healthy VTE Prophylaxis: Lovenox    Disposition: Med/tele  History of Present Illness:  [name] [name] is a 63 y.o. male presenting with dyspnea.   Was planning to have mastoidectomy this AM. Felt fine yesterday. Reports he was told to use chlorohexadine around 9PM and about an hour after that felt terrible. States he started coughing and breathing hard with chest tightness. Feels like it is a possibility the soap caused everything. Also felt weak all over at that time. Feels like he is coughing up yellow-brown balls of mucus which is abnormal. Also felt hot but did not take his temperature.  Reports he was here last Thursday 8/27 for COPD exacerbation and he ran out of steroids on Tuesday (9/2) which was helping his breathing. Has still not felt great since that time.   Did not take Dulera  yesterday, states he ran out and was getting more today. Needed 6 puffs of his albuterol .   Still feels hard to breath at times. Denies chest pain.   In the ED, patient was afebrile s/p Solu-Medrol , magnesium , and 2 DuoNebs by EMS.  Initially hypoxic to 82% with some exertion, placed on 3L [place]. Noted to be tight with coughing, started on CAT 12/hr.  Chest x-ray showed mild cardiomegaly and emphysema, no acute disease.  WBC 11.2.  Troponins flat at 5 x2.  BNP 29.3.  Review Of Systems: Per HPI  Pertinent Past Medical History: COPD HTN HLD T2DM OSA on CPAP  CAD w/ stents Chronic bilateral mastoiditis Remainder reviewed in history tab.   Pertinent Past Surgical History: Coronary angioplasty with stent placement 2013, 2015 Left tympanomastoidectomy  Remainder reviewed in history tab.   Pertinent Social History: Tobacco use: Former, 42 pack year history. Quit  in 2022 Alcohol use: Quit 3 years ago. Drink beer previously Other Substance use: None Lives with sister  Pertinent Family History: Father - MI Brother - DM, MI, stroke  Sister - HTN, HLD, DM   Important Outpatient Medications: *Took meds yesterday except for jardiance  and dulera   Albuterol  inhaler  ASA 81 mg Atorvastatin  80 mg  Carvedilol  3.125 mg  Citalopram  20 mg  Jardiance  25 mg  Lisinopril  2.5 mg  Metformin  1,000 mg  Dulera  Omeprazole  20 mg  Parasugrel 10 mg  Prednisone  10 mg (not taking) Semaglutide  2 mg  Zinc  oxide 20% ointment   Objective: BP 126/62   Pulse 75   Temp 98 F (36.7 C) (Oral)   Resp 16   SpO2 95%  Exam: General: NAD on CAT Eyes: Nonicteric, extraocular movements grossly intact Cardiovascular: RRR, no M/R/G Respiratory: Inspiratory and expiratory wheezes throughout, normal work of breathing on CAT with 6L aerosol mask. Speaking in full sentences without difficulty Gastrointestinal: Soft, nontender to palpation, nondistended MSK: Grossly moving all limbs equally Derm: Chronic areas of dermatitis on bilateral lower extremities with erythema and areas of excoriations Neuro: No gross focal deficits Psych: Mood and affect appropriate  Labs:  CBC BMET  Recent Labs  Lab 05/16/24 0217  WBC 11.2*  HGB 12.1*  HCT 39.3  PLT 298   Recent Labs  Lab 05/16/24 0217  NA 135  K 3.8  CL 101  CO2 23  BUN 15  CREATININE 0.73  GLUCOSE 244*  CALCIUM  8.5*    Pertinent additional labs: D-dimer: <0.27 Troponin: 5 BNP: 29.3  EKG: No ST elevations, no bundle-branch blocks, irregular rhythm  Imaging Studies Performed:  Chest x-ray Impression from Radiologist:  1. No evidence of acute chest disease. 2. Mild cardiomegaly. 3. Emphysema.   My Interpretation: No focal consolidations, no pleural effusions, cardiomegaly   Lennie Raguel MATSU, DO 05/16/2024, 4:52 AM PGY-1, [facility] Family Medicine  FPTS Intern pager: [phone], text pages  welcome Secure chat group [facility] Teaching Service   Upper Level Addendum:   I have seen and evaluated this patient along with Dr. Lennie and reviewed the above note, making necessary revisions as appropriate.  I agree with the medical decision making and physical exam as noted above.   Izetta Nap, DO PGY-3, [facility] Family Medicine Residency

## 2024-05-16 NOTE — Progress Notes (Signed)
 Patient started on 10 mg CAT per MD order.

## 2024-05-16 NOTE — Assessment & Plan Note (Addendum)
 Wheezing throughout but no respiratory distress upon exam. Currently on CAT, will wean to scheduled breathing treatments. S/p Solu-Medrol , magnesium , and 2 DuoNebs by EMS.  - FMTS will admit, attending Dr. Donah  - DuoNebs every 4 hours with q2h prn breakthrough - Doxycycline  100 mg every 12 hours x 5 days - Prednisone  50 mg x 4 days - Respiratory panel - Wean oxygen as tolerated - Consider escalation to triple therapy inhaler upon discharge

## 2024-05-16 NOTE — Plan of Care (Signed)

## 2024-05-16 NOTE — Assessment & Plan Note (Signed)
 A1c 9.2, will need additional management in the setting of steroid use as above. - Resistant SSI with CBGs - Continue home Lantus  20 units daily

## 2024-05-16 NOTE — Plan of Care (Signed)
 FMTS Interim Progress Note  CODE STATUS: Full code S: Saw patient at bedside and reports that he was feeling much better. Reported that has had some runny brown BM 4-6 times over last two days but no fevers or chills. Reports cough has improved a little bit and denies any chest pain.  O: BP (!) 149/77   Pulse 86   Temp 98 F (36.7 C) (Oral)   Resp 18   SpO2 96%    Cardiac: Regular rate and rhythm. Normal S1/S2. No murmurs, rubs, or gallops appreciated. Lungs: Clear bilaterally to auscultation, no wheezing or crackles bilaterally. Abdomen: Normoactive bowel sounds. No tenderness to deep or light palpation. No rebound or guarding.    Psych: Pleasant and appropriate   A/P: COPD exacerbation: Physical exam, good saturation at 2 L nasal cannula overnight, afebrile with normal WBC. Respiratory panel negative, so low suspicion for overlying infection given no acute CXR finding. -DuoNebs every 4 hours as needed depending on respiratory status -Continue Doxy 100  mg twice daily 9/4-9/8 -Continue Prednisone  50 mg daily 9/4-9/7 -Wean oxygen as tolerated, -Consider escalation to triple therapy at DC  Insulin -dependent diabetes mellitus type 2: A1c 9.2 on 05/14/2024, random glucose at 2 AM was 244 -Resistant SSI with CBGs -On home Lantus  20 units daily -- Will give additional 10 u  Lantus , has received 22 units sliding scale.  Chronic stable conditions: HLD-continue home Lipitor  80 mg CAD-continue home aspirin  81 mg, carvedilol  3.125 mg, prasugrel  10 mg Anxiety-continue home citalopram  20 mg OSA-continue home CPAP nightly  Felix Pratt, MD 05/16/2024, 7:31 AM PGY-1, [facility] Health Family Medicine Service pager [phone]

## 2024-05-16 NOTE — Hospital Course (Signed)
 [name] [name] is a 63 y.o.male with a history of *** who was admitted to the [facility] Medicine Teaching Service at [facility] for dyspnea. His hospital course is detailed below:   Other chronic conditions were medically managed with home medications and formulary alternatives as necessary (***)  PCP Follow-up Recommendations:

## 2024-05-16 NOTE — ED Provider Notes (Signed)
 [name]   CSN: [id number] Arrival date & time: 05/16/24  0204     Patient presents with: Shortness of Breath   [name] [name] is a 63 y.o. male.   Patient with a history of hypertension, CAD with stents, GERD, diabetes, COPD no he reports not a smoker.  Presents with sudden onset shortness of breath since about 9 PM.  He was recently seen August 27 and treated for COPD exacerbation with prednisone .  He states he finished this 2 days ago.  He felt relatively well up until tonight when he became short of breath all of a sudden with coughing and wheezing.  Cough productive of yellow mucus.  And short of breath despite using his albuterol  at home.  EMS was called and found to be saturating 82% on room air.  He was given Solu-Medrol , magnesium  and 2 DuoNebs and route.  He denies chest pain.  He denies leg pain or leg swelling.  Denies fever.  He is supposed to have surgery on his ear tomorrow by Dr. Carlie.  The history is provided by the patient and the EMS personnel. The history is limited by the condition of the patient.  Shortness of Breath Associated symptoms: cough   Associated symptoms: no abdominal pain, no chest pain, no fever, no headaches, no rash and no vomiting        Prior to Admission medications   Medication Sig Start Date End Date Taking? Authorizing Provider  albuterol  (VENTOLIN  HFA) 108 (90 Base) MCG/ACT inhaler INHALE 2 PUFFS INTO THE LUNGS EVERY 6 HOURS AS NEEDED FOR WHEEZING OR FOR SHORTNESS OF BREATH 03/27/24   Delores Suzann HERO, MD  aspirin  81 MG chewable tablet Chew 1 tablet (81 mg total) by mouth daily. 10/04/13   Fredirick Glenys RAMAN, MD  atorvastatin  (LIPITOR ) 80 MG tablet TAKE 1 TABLET BY MOUTH DAILY 04/24/24   Delores Suzann HERO, MD  Blood Pressure Monitoring (BLOOD PRESSURE CUFF) MISC 1 kit by Does not apply route 2 (two) times a week. 11/27/23   Alba Sharper, MD  carvedilol  (COREG ) 3.125 MG tablet TAKE 1 TABLET BY  MOUTH TWICE A DAY WITH A MEAL 05/22/23   Chambliss, Layman CROME, MD  citalopram  (CELEXA ) 20 MG tablet Take 1 tablet (20 mg total) by mouth daily. 07/19/23   Jeanelle Layman CROME, MD  Continuous Glucose Receiver (FREESTYLE LIBRE 2 READER) DEVI Please use with Freestyle Libre Sensor. Dx: E11.9 01/19/24   Delores Suzann HERO, MD  Continuous Glucose Sensor (FREESTYLE LIBRE 2 PLUS SENSOR) MISC Change sensor every 15 days. 04/18/24   Delores Suzann HERO, MD  Continuous Glucose Sensor (FREESTYLE LIBRE 2 SENSOR) MISC APPLY SENSOR TO SKIN FOR CONTINUOUS BLOOD SUGAR MONITORING, REPLACE EVERY 14 DAYS 04/17/24   Delores Suzann HERO, MD  glucose blood (ACCU-CHEK GUIDE) test strip Use as instructed 10/11/21   Jeanelle Layman CROME, MD  hydrocortisone  2.5 % ointment Apply 1 Application topically. 02/18/20   [provider]  insulin  glargine (LANTUS  SOLOSTAR) 100 UNIT/ML Solostar Pen Inject 20 Units into the skin daily. 02/21/24   Theophilus Pagan, MD  Insulin  Pen Needle (DROPLET PEN NEEDLES) 29G X MISC Use to inject insulin  daily 11/22/23   Delores Suzann HERO, MD  Insulin  Pen Needle (PEN NEEDLES) 32G X 4 MM MISC Use as directed with insulin  four times daily 09/15/21   Jeanelle Layman CROME, MD  JARDIANCE  25 MG TABS tablet TAKE 1 TABLET BY MOUTH DAILY 05/22/23   Chambliss,  Layman CROME, MD  ketoconazole  (NIZORAL ) 2 % cream Apply 1 Application topically daily. BETWEEN TOES for 1 week 03/11/24   Delores Suzann HERO, MD  lisinopril  (ZESTRIL ) 2.5 MG tablet Take 5 mg by mouth daily.    [provider]  melatonin 5 MG TABS Take 10 mg by mouth at bedtime.    [provider]  metFORMIN  (GLUCOPHAGE ) 1000 MG tablet TAKE 1 TABLET BY MOUTH TWICE A DAY WITH A MEAL 01/25/24   Delores Suzann HERO, MD  mometasone -formoterol  (DULERA ) 200-5 MCG/ACT AERO Inhale 2 puffs into the lungs in the morning and at bedtime. 05/16/23   Jeanelle Layman CROME, MD  Multiple Vitamin (MULTIVITAMIN) tablet Take 1 tablet by mouth daily.    [provider]   nitroGLYCERIN  (NITROSTAT ) 0.4 MG SL tablet DISSOLVE 1 TABLET UNDER THE TONGUE FOR CHEST PAIN. IF PAIN REMAINS AFTER 5 MINUTES, CALL 911 AND REPEAT DOSE. MAX 3 TABLETS IN 15 MINUTES 11/01/21   Chambliss, Layman CROME, MD  NON FORMULARY Pt uses a c-pap nightly    [provider]  nystatin  cream (MYCOSTATIN ) Apply 1 Application topically 2 (two) times daily as needed for dry skin.    [provider]  omeprazole  (PRILOSEC) 20 MG capsule Take 1 capsule (20 mg total) by mouth 2 (two) times daily before a meal. 03/06/24 03/06/25  Mannam, Praveen, MD  prasugrel  (EFFIENT ) 10 MG TABS tablet Take 1 tablet (10 mg total) by mouth daily. 10/19/23   [place], Peter M, MD  predniSONE  (DELTASONE ) 10 MG tablet Take 2 tablets (20 mg total) by mouth daily. 05/08/24   Patsey Lot, MD  Semaglutide , 2 MG/DOSE, (OZEMPIC , 2 MG/DOSE,) 8 MG/3ML SOPN Inject 2 mg into the skin once a week. Monday 11/10/23   Delores Suzann HERO, MD  tolnaftate (TINACTIN) 1 % cream Apply 1 Application topically daily as needed (APPLY TO FEET). 08/12/23   [provider]  traMADol  (ULTRAM ) 50 MG tablet Take 50 mg by mouth every 12 (twelve) hours as needed for moderate pain (pain score 4-6).    [provider]  triamcinolone  ointment (KENALOG ) 0.1 % Apply 1 Application topically 2 (two) times daily. Patient taking differently: Apply 1 Application topically 2 (two) times daily as needed (APPLY TP FEET). 03/11/24   Delores Suzann HERO, MD  zinc  oxide 20 % ointment Apply 1 Application topically as needed for irritation. 03/12/24   Delores Suzann HERO, MD    Allergies: Shellfish allergy and Yellow dye #6 (sunset yellow)    Review of Systems  Constitutional:  Negative for activity change, appetite change and fever.  HENT:  Negative for congestion and rhinorrhea.   Respiratory:  Positive for cough and shortness of breath. Negative for chest tightness.   Cardiovascular:  Negative for chest pain.  Gastrointestinal:  Negative for  abdominal pain, nausea and vomiting.  Genitourinary:  Negative for dysuria and hematuria.  Musculoskeletal:  Negative for arthralgias and myalgias.  Skin:  Negative for rash.  Neurological:  Negative for dizziness, weakness and headaches.   all other systems are negative except as noted in the HPI and PMH.    Updated Vital Signs BP (!) 142/72 (BP Location: Right Arm)   Pulse 68   Temp 98 F (36.7 C) (Oral)   Resp 13   SpO2 100%   Physical Exam Vitals and nursing note reviewed.  Constitutional:      General: He is in acute distress.     Appearance: He is well-developed. He is obese.  Comments: Moderate respiratory distress, speaking in short phrases  HENT:     Head: Normocephalic and atraumatic.     Mouth/Throat:     Pharynx: No oropharyngeal exudate.  Eyes:     Conjunctiva/sclera: Conjunctivae normal.     Pupils: Pupils are equal, round, and reactive to light.  Neck:     Comments: No meningismus. Cardiovascular:     Rate and Rhythm: Normal rate and regular rhythm.     Heart sounds: Normal heart sounds. No murmur heard. Pulmonary:     Effort: Respiratory distress present.     Breath sounds: Wheezing present.  Abdominal:     Palpations: Abdomen is soft.     Tenderness: There is no abdominal tenderness. There is no guarding or rebound.  Musculoskeletal:        General: No tenderness. Normal range of motion.     Cervical back: Normal range of motion and neck supple.  Skin:    General: Skin is warm.  Neurological:     Mental Status: He is alert and oriented to person, place, and time.     Cranial Nerves: No cranial nerve deficit.     Motor: No abnormal muscle tone.     Coordination: Coordination normal.     Comments: No ataxia on finger to nose bilaterally. No pronator drift. 5/5 strength throughout. CN 2-12 intact.Equal grip strength. Sensation intact.   Psychiatric:        Behavior: Behavior normal.     (all labs ordered are listed, but only abnormal results  are displayed) Labs Reviewed  CBC WITH DIFFERENTIAL/PLATELET - Abnormal; Notable for the following components:      Result Value   WBC 11.2 (*)    Hemoglobin 12.1 (*)    MCH 25.6 (*)    Abs Immature Granulocytes 0.09 (*)    All other components within normal limits  COMPREHENSIVE METABOLIC PANEL WITH GFR - Abnormal; Notable for the following components:   Glucose, Bld 244 (*)    Calcium  8.5 (*)    Albumin 3.3 (*)    All other components within normal limits  RESPIRATORY PANEL BY PCR  BRAIN NATRIURETIC PEPTIDE  D-DIMER, QUANTITATIVE (NOT AT [facility])  TROPONIN I (HIGH SENSITIVITY)  TROPONIN I (HIGH SENSITIVITY)    EKG: None  Radiology: DG Chest Portable 1 View Result Date: 05/16/2024 CLINICAL DATA:  Shortness of breath. EXAM: PORTABLE CHEST 1 VIEW COMPARISON:  Portable chest 05/08/2024. FINDINGS: The heart is slightly enlarged.  There are coronary artery stents. No vascular congestion is seen. The lungs are slightly emphysematous but clear. The mediastinum is stable and normal in outline. There is osteopenia and thoracic spondylosis. Comparison to the prior study reveals no significant interval change. IMPRESSION: 1. No evidence of acute chest disease. 2. Mild cardiomegaly. 3. Emphysema. Electronically Signed   By: Francis Quam M.D.   On: 05/16/2024 03:15     Procedures   Medications Ordered in the ED  aspirin  chewable tablet 81 mg (has no administration in time range)  atorvastatin  (LIPITOR ) tablet 80 mg (has no administration in time range)  carvedilol  (COREG ) tablet 3.125 mg (has no administration in time range)  citalopram  (CELEXA ) tablet 20 mg (has no administration in time range)  empagliflozin  (JARDIANCE ) tablet 25 mg (has no administration in time range)  pantoprazole  (PROTONIX ) EC tablet 40 mg (has no administration in time range)  prasugrel  (EFFIENT ) tablet 10 mg (has no administration in time range)  enoxaparin  (LOVENOX ) injection 40 mg (has no administration in time  range)  doxycycline  (VIBRA -TABS) tablet 100 mg (has no administration in time range)  ipratropium-albuterol  (DUONEB) 0.5-2.5 (3) MG/3ML nebulizer solution 3 mL (has no administration in time range)  ipratropium-albuterol  (DUONEB) 0.5-2.5 (3) MG/3ML nebulizer solution 3 mL (has no administration in time range)  predniSONE  (DELTASONE ) tablet 50 mg (has no administration in time range)  insulin  aspart (novoLOG ) injection 0-20 Units (has no administration in time range)  insulin  aspart (novoLOG ) injection 0-5 Units (has no administration in time range)  insulin  glargine (LANTUS ) injection 20 Units (has no administration in time range)  ipratropium-albuterol  (DUONEB) 0.5-2.5 (3) MG/3ML nebulizer solution 3 mL (3 mLs Nebulization Given 05/16/24 0231)  albuterol  (PROVENTIL ) (2.5 MG/3ML) 0.083% nebulizer solution 10 mg (10 mg Nebulization Given 05/16/24 0427)  ipratropium (ATROVENT ) nebulizer solution 0.5 mg (0.5 mg Nebulization Given 05/16/24 0427)                                    Medical Decision Making Amount and/or Complexity of Data Reviewed Independent Historian: EMS Labs: ordered. Decision-making details documented in ED Course. Radiology: ordered and independent interpretation performed. Decision-making details documented in ED Course. ECG/medicine tests: ordered and independent interpretation performed. Decision-making details documented in ED Course.  Risk Prescription drug management. Decision regarding hospitalization.   Acute onset of shortness of breath at 9 PM with hypoxia, coughing and wheezing.  Denies chest pain.  EKG without acute ischemia.  He was given bronchodilators and steroids by EMS.  Will continue albuterol . Check labs and x-ray  Labs show hyperglycemia without DKA.  Mild leukocytosis.  Chest x-ray negative for pneumonia but does show evidence of COPD.  He was given bronchodilators, steroids, magnesium .  Still with diminished breath sounds with wheezing and  borderline hypoxia to the low 90s. D-dimer negative with low concern for PE.  Troponin negative the low concern for ACS  Patient is wheezing and coughing has improved though he is desaturating to the mid 90s at rest and upper 80s with exertion.  Still having coughing and wheezing.  Additional bronchodilators and steroids magnesium  given.  Plan hospitalization for further treatment of COPD exacerbation.  Low concern for ACS or PE.  Discussed with family practice residents.     Final diagnoses:  None    ED Discharge Orders     None          Aubra Pappalardo, Garnette, MD 05/16/24 [id number]

## 2024-05-16 NOTE — ED Triage Notes (Signed)
 Pt bib [facility] from home with SHOB x 5 hours. EMS found pt with sats 80% on room air. Given 125mg  solumedrol, 2G mag, 2 duonebs en route.

## 2024-05-16 NOTE — ED Notes (Signed)
 CCMD called.

## 2024-05-17 ENCOUNTER — Telehealth (HOSPITAL_COMMUNITY): Payer: Self-pay | Admitting: Pharmacy Technician

## 2024-05-17 ENCOUNTER — Other Ambulatory Visit: Payer: Self-pay | Admitting: Family Medicine

## 2024-05-17 ENCOUNTER — Other Ambulatory Visit (HOSPITAL_COMMUNITY): Payer: Self-pay

## 2024-05-17 DIAGNOSIS — J449 Chronic obstructive pulmonary disease, unspecified: Secondary | ICD-10-CM

## 2024-05-17 DIAGNOSIS — J441 Chronic obstructive pulmonary disease with (acute) exacerbation: Secondary | ICD-10-CM | POA: Diagnosis not present

## 2024-05-17 LAB — GLUCOSE, CAPILLARY
Glucose-Capillary: 182 mg/dL — ABNORMAL HIGH (ref 70–99)
Glucose-Capillary: 241 mg/dL — ABNORMAL HIGH (ref 70–99)

## 2024-05-17 MED ORDER — DOXYCYCLINE HYCLATE 100 MG PO TABS
100.0000 mg | ORAL_TABLET | Freq: Two times a day (BID) | ORAL | 0 refills | Status: AC
  Filled 2024-05-17: qty 7, 4d supply, fill #0

## 2024-05-17 MED ORDER — SPIRIVA HANDIHALER 18 MCG IN CAPS
18.0000 ug | ORAL_CAPSULE | Freq: Every day | RESPIRATORY_TRACT | 12 refills | Status: DC
  Filled 2024-05-17: qty 30, 30d supply, fill #0

## 2024-05-17 MED ORDER — LANTUS SOLOSTAR 100 UNIT/ML ~~LOC~~ SOPN: 25.0000 [IU] | PEN_INJECTOR | Freq: Every day | SUBCUTANEOUS | Status: DC

## 2024-05-17 MED ORDER — BUDESON-GLYCOPYRROL-FORMOTEROL 160-9-4.8 MCG/ACT IN AERO
2.0000 | INHALATION_SPRAY | Freq: Two times a day (BID) | RESPIRATORY_TRACT | Status: DC
  Filled 2024-05-17: qty 5.9

## 2024-05-17 MED ORDER — PREDNISONE 50 MG PO TABS
50.0000 mg | ORAL_TABLET | Freq: Every day | ORAL | 0 refills | Status: AC
  Filled 2024-05-17: qty 3, 3d supply, fill #0

## 2024-05-17 NOTE — Progress Notes (Signed)
 Nurse requested Mobility Specialist to perform oxygen saturation test with pt which includes removing pt from oxygen both at rest and while ambulating.  Below are the results from that testing.     Patient Saturations on Room Air at Rest = spO2 91%  Patient Saturations on Room Air while Ambulating = sp02 87% .  Patient Saturations on 2 Liters of oxygen while Ambulating = sp02 94%  At end of testing pt left in room on 2L  Liters of oxygen.  Reported results to nurse.

## 2024-05-17 NOTE — TOC Transition Note (Signed)
 Transition of Care [facility]) - Discharge Note   Patient Details  Name: [name] [name] MRN: [id number] Date of Birth: 11-02-1960  [facility] CM/SW Contact:  Waddell Barnie Rama, RN Phone Number: 05/17/2024, 2:24 PM   Clinical Narrative:    For dc today, he will need a cab voucher in dc lounge at dc.          Patient Goals and CMS Choice            Discharge Placement                       Discharge Plan and Services Additional resources added to the After Visit Summary for                                       Social Drivers of Health (SDOH) Interventions SDOH Screenings   Food Insecurity: No Food Insecurity (05/16/2024)  Housing: Low Risk  (05/16/2024)  Transportation Needs: No Transportation Needs (05/16/2024)  Utilities: Not At Risk (05/16/2024)  Depression (PHQ2-9): Low Risk  (02/26/2024)  Tobacco Use: Medium Risk (05/16/2024)  Health Literacy: Inadequate Health Literacy (08/11/2023)   [facility] from [facility]     Readmission Risk Interventions    09/22/2023   11:25 AM  Readmission Risk Prevention Plan  Post Dischage Appt Complete  Medication Screening Complete  Transportation Screening Complete

## 2024-05-17 NOTE — Telephone Encounter (Signed)
 Patient Product/process development scientist completed.    The patient is insured through [facility] MEDICAID.     Ran test claim for Spiriva  and the current 30 day co-pay is $4.00.  Ran test claim for Anoro Ellipta and the current 30 day co-pay is $4.00.  This test claim was processed through [facility]- copay amounts may vary at other pharmacies due to pharmacy/plan contracts, or as the patient moves through the different stages of their insurance plan.     Reyes Sharps, CPHT Pharmacy Technician III Certified Patient Advocate [facility] Patient Advocate Team Direct Number: [phone]  Fax: [phone]

## 2024-05-17 NOTE — TOC CM/SW Note (Signed)
 Transition of Care [facility]) - Inpatient Brief Assessment   Patient Details  Name: [name] [name] MRN: [id number] Date of Birth: 12-22-1960  Transition of Care [facility]) CM/SW Contact:    Waddell Barnie Rama, RN Phone Number: 05/17/2024, 2:21 PM   Clinical Narrative: From home with sister, has PCP and insurance on file, states has no HH services in place at this time , has cpap at home.  States he will need a cab to  transport them home at [employer] and family is support system, states gets medications from [facility] on [address].  Pta self ambulatory.   There are no ICM (inpatient Case Manhgament)  needs identified  at this time.  Please place consult for ICM (Inpatient Case Management)  needs.     Transition of Care Asessment: Insurance and Status: Insurance coverage has been reviewed Patient has primary care physician: Yes Home environment has been reviewed: home with sister Prior level of function:: indep Prior/Current Home Services: No current home services Social Drivers of Health Review: SDOH reviewed no interventions necessary Readmission risk has been reviewed: Yes Transition of care needs: transition of care needs identified, TOC will continue to follow

## 2024-05-17 NOTE — Inpatient Diabetes Management (Signed)
 Inpatient Diabetes Program Recommendations  AACE/ADA: New Consensus Statement on Inpatient Glycemic Control (2015)  Target Ranges:  Prepandial:   less than 140 mg/dL      Peak postprandial:   less than 180 mg/dL (1-2 hours)      Critically ill patients:  140 - 180 mg/dL   Lab Results  Component Value Date   GLUCAP 182 (H) 05/17/2024   HGBA1C 9.2 (H) 05/14/2024    Review of Glycemic Control  Latest Reference Range & Units 05/16/24 08:21 05/16/24 11:07 05/16/24 15:48 05/16/24 21:09 05/17/24 06:22  Glucose-Capillary 70 - 99 mg/dL 700 (H) 670 (H) 680 (H) 215 (H) 182 (H)  (H): Data is abnormally high  Diabetes history: DM 2 Outpatient Diabetes medications: Lantus  20 units Daily, Jardiace 25 mg Daily, Metformin  1000 mg Daily, Ozempic  2 mg Weekly Current orders for Inpatient glycemic control:  Lantus  30 units  Novolog  0-20 units tid + hs Prednisone  50 mg QD  Inpatient Diabetes Program Recommendations:    While on steroids, please consider:  Novolog  3 units TID with meals if he consumes at least 50%.   Thank you, Wyvonna Pinal, MSN, CDCES Diabetes Coordinator Inpatient Diabetes Program [phone] (team pager from 8a-5p)

## 2024-05-17 NOTE — Care Management Obs Status (Signed)
 MEDICARE OBSERVATION STATUS NOTIFICATION   Patient Details  Name: [name] [name] MRN: [id number] Date of Birth: [birth year]   Medicare Observation Status Notification Given:  Yes    Vonzell Arrie Sharps 05/17/2024, 8:09 AM

## 2024-05-17 NOTE — Progress Notes (Signed)
    SUBJECTIVE:   CHIEF COMPLAINT: hospital follow up  HPI:   [name] [name] is a 63 y.o.  with history notable for COPD, CAD  presenting for hospital follow up from COPD .   Discussed the use of AI scribe software for clinical note transcription with the patient, who gave verbal consent to proceed.  History of Present Illness   COPD Exacerbation- hospital follow up  - Shortness of breath began at night after using a pre-surgery soap - Has to pick up Dulera , will do so today - Uncertain how to use spiriva    Glycemic control - Diabetes management affected by recent steroid use, resulting in elevated blood glucose levels (was in 200s)  - Blood glucose levels now decreasing as steroid is tapered - Uses 20 units of Lantus  insulin  daily - Uses Ozempic  weekly; missed one dose last week due to surgery scheduling - Monitors blood glucose regularly  Chronic back pain - Experiences back pain - Uses tramadol  as needed, typically 10 to 15 tablets - No adverse effects - Does not use with alcohol - Going on a trip soon and this bothers back - PDMP appropriate   Medication management and travel planning - Abstains from alcohol - Plans to travel to [place]  and wants to ensure sufficient medication supply for the trip  PERTINENT  PMH / PSH/Family/Social History : type 2 DM, up to date on CT   OBJECTIVE:   BP 137/69   Pulse 70   Ht 5' 7 (1.702 m)   Wt 279 lb 12.8 oz (126.9 kg)   SpO2 96%   BMI 43.82 kg/m   Today's weight:  Last Weight  Most recent update: 05/20/2024 10:38 AM    Weight  126.9 kg (279 lb 12.8 oz)            Review of prior weights: [employer]   05/20/24 1037  Weight: 279 lb 12.8 oz (126.9 kg)    RRR Lungs clear Speaking in full sentences Demonstrated use of Spiriva  and pulse oximeter   ASSESSMENT/PLAN:   Assessment & Plan Type 2 diabetes mellitus without complication, with long-term current use of insulin  (HCC) BG rising A1c elevated after  steroid use  Discussed he is to monitor CBG If not between 120-150 by end of week, he will call, may need to increase lantus  Post prandial CBG in office 178 on CGM  Normocytic anemia Repeat CBC with ferritin from hospital stay  Depression, acute Refilled Citalopram   Acute bronchitis with COPD (HCC) COPD by PFT Discussed inhaler use--I suspect this may have caused flare (ran out of Dulera ?) Improving  Chronic low back pain, unspecified back pain laterality, unspecified whether sciatica present PDMP reviewed Tramadol  refilled Discussed safe use     Suzann Daring, MD  Family Medicine Teaching Service  [facility]

## 2024-05-17 NOTE — Discharge Instructions (Addendum)
 Dear [name] [name],  Thank you for letting us  participate in your care. You were hospitalized for shortness of breath and diagnosed with COPD exacerbation (HCC). You were treated with oxygen, steroids, antibiotics.   POST-HOSPITAL & CARE INSTRUCTIONS Patient to take all your medications as prescribed in this packet. Go to your follow up appointments (listed below)  DOCTOR'S APPOINTMENT   Future Appointments  Date Time Provider Department Center  05/20/2024 11:30 AM Delores Suzann HERO, MD [facility]  06/24/2024  3:15 PM McCaughan, Dia D, DPM [facility]  08/15/2024  2:15 PM Mannam, Praveen, MD [facility] [phone] LELON Das    Follow-up Information     Delores Suzann HERO, MD. Go on 05/20/2024.   Specialty: Family Medicine Why: at 11:30 AM for hospital follow up. Contact information: [address] [place] [address] [phone]                 Take care and be well!  Family Medicine Teaching Service Inpatient Team [place]  [facility]  [address] [place], [place] [address] [phone]

## 2024-05-17 NOTE — Progress Notes (Signed)
 Mobility Specialist Progress Note:    05/17/24 1010  Mobility  Activity Ambulated with assistance  Level of Assistance Standby assist, set-up cues, supervision of patient - no hands on  Assistive Device None  Distance Ambulated (ft) 200 ft  Activity Response Tolerated fair  Mobility Referral Yes  Mobility visit 1 Mobility  Mobility Specialist Start Time (ACUTE ONLY) 1010  Mobility Specialist Stop Time (ACUTE ONLY) 1023  Mobility Specialist Time Calculation (min) (ACUTE ONLY) 13 min   Received pt in recliner waiting for session. No c/o initially but after, some SOB. Pt recovered very quickly while sitting in recliner. Otherwise moves and ambulates well. Returned pt to recliner w/ all needs met.   Venetia Keel Mobility Specialist Please Neurosurgeon or Rehab Office at [phone]

## 2024-05-17 NOTE — Progress Notes (Signed)
   Patient Saturations on Room Air at Rest = 94%  Patient Saturations on [employer] while Ambulating = 90%

## 2024-05-17 NOTE — Discharge Summary (Signed)
 Family Medicine Teaching [facility] Discharge Summary  Patient name: [name] [name] Medical record number: [id number] Date of birth: 1961-08-16 Age: 63 y.o. Gender: male Date of Admission: 05/16/2024  Date of Discharge:/01/2024 Admitting Physician: [name] JINNY Legions, MD  Primary Care Provider: Delores Suzann HERO, MD Consultants: None  Indication for Hospitalization: Dyspnea 2/2 COPD exacerbation  Discharge Diagnoses/Problem List:  Principal Problem for Admission: COPD exacerbation Other Problems addressed during stay:  Principal Problem:   COPD exacerbation (HCC) Active Problems:   Type 2 diabetes mellitus [facility])   Brief Hospital Course:  [name] [name] is a 63 y.o.male with a history of  COPD, OSA, CAD, T2DM, HLD, HTN and chronic mastoiditis who was admitted to the [facility] Medicine Teaching Service at [facility] for dyspnea. His hospital course is detailed below:  COPD exacerbation Patient was initially admitted to our service for dyspnea in context of COPD exacerbation.  Started doxycycline , prednisone , and DuoNeb which was weaned to his home dose of Dulera  over admission.  His oxygen saturation improved with 2 L nasal cannula, and was gradually weaned to room air.  He passed amatory pulse ox and sent home with remaining dose of doxycycline  and prednisone  along with Spiriva  combined with Dulera  at discharge.  Other chronic conditions were medically managed with home medications and formulary alternatives as necessary (T2DM)  PCP Follow-up Recommendations: Ensure completion of prednisone  (9/5-9/8) and doxycycline  (9/4-9/8) treatment Assess improvement of respiratory status with the addition of Spiriva  to his home Dulera  (triple therapy not covered by insurance) Evaluate blood glucose control in the setting of steroid use, instructed patient to go up to 25 units daily at d/c   Disposition: Home  Discharge Condition: Stable  Discharge Exam:  Vitals:   05/17/24 0711 05/17/24 0733  BP:  (!) 152/62   Pulse: 83 78  Resp: 20 18  Temp: 98.2 F (36.8 C)   SpO2: 91% 92%    Cardiac: Distant heart sounds, regular rate and rhythm. Normal S1/S2. No murmurs, rubs, or gallops appreciated. Lungs: Clear bilaterally to auscultation, no wheezes or crackles. Saturating 90's on RA. Abdomen: Normoactive bowel sounds. No tenderness to deep or light palpation. No rebound or guarding.  Psych: Pleasant and appropriate   Significant Procedures: None  Significant Labs and Imaging:  Recent Labs  Lab 05/16/24 0217  WBC 11.2*  HGB 12.1*  HCT 39.3  PLT 298   Recent Labs  Lab 05/16/24 0217  NA 135  K 3.8  CL 101  CO2 23  GLUCOSE 244*  BUN 15  CREATININE 0.73  CALCIUM  8.5*  ALKPHOS 89  AST 20  ALT 16  ALBUMIN 3.3*    Imaging chest x-ray 9/4 IMPRESSION: 1. No evidence of acute chest disease. 2. Mild cardiomegaly. 3. Emphysema.   Discharge Medications:  Allergies as of 05/17/2024       Reactions   Shellfish Allergy Nausea And Vomiting   OYSTERS   Yellow Dye #6 (sunset Yellow) Other (See Comments)   Gi intolerance         Medication List     STOP taking these medications    traMADol  50 MG tablet Commonly known as: ULTRAM        TAKE these medications    aspirin  81 MG chewable tablet Chew 1 tablet (81 mg total) by mouth daily.   atorvastatin  80 MG tablet Commonly known as: LIPITOR  TAKE 1 TABLET BY MOUTH DAILY What changed: when to take this   carvedilol  3.125 MG tablet Commonly known as: COREG  TAKE 1 TABLET  BY MOUTH TWICE A DAY WITH A MEAL   citalopram  20 MG tablet Commonly known as: CELEXA  Take 1 tablet (20 mg total) by mouth daily.   doxycycline  100 MG tablet Commonly known as: VIBRA -TABS Take 1 tablet (100 mg total) by mouth every 12 (twelve) hours for 7 doses.   Dulera  200-5 MCG/ACT Aero Generic drug: mometasone -formoterol  INHALE 2 PUFFS INTO THE LUNGS EVERY MORNING AND AT BEDTIME What changed: See the new instructions.    hydrocortisone  2.5 % ointment Apply 1 Application topically as needed (skin irritation).   Jardiance  25 MG Tabs tablet Generic drug: empagliflozin  TAKE 1 TABLET BY MOUTH DAILY   ketoconazole  2 % cream Commonly known as: NIZORAL  Apply 1 Application topically daily. BETWEEN TOES for 1 week What changed:  when to take this reasons to take this   Lantus  SoloStar 100 UNIT/ML Solostar Pen Generic drug: insulin  glargine Inject 25 Units into the skin daily. What changed: how much to take   lisinopril  2.5 MG tablet Commonly known as: ZESTRIL  Take 5 mg by mouth daily.   melatonin 5 MG Tabs Take 10 mg by mouth at bedtime.   metFORMIN  1000 MG tablet Commonly known as: GLUCOPHAGE  TAKE 1 TABLET BY MOUTH TWICE A DAY WITH A MEAL   multivitamin tablet Take 1 tablet by mouth daily. Centrum Men 50+   nitroGLYCERIN  0.4 MG SL tablet Commonly known as: NITROSTAT  DISSOLVE 1 TABLET UNDER THE TONGUE FOR CHEST PAIN. IF PAIN REMAINS AFTER 5 MINUTES, CALL 911 AND REPEAT DOSE. MAX 3 TABLETS IN 15 MINUTES   nystatin  cream Commonly known as: MYCOSTATIN  Apply 1 Application topically 2 (two) times daily as needed for dry skin.   omeprazole  20 MG capsule Commonly known as: PRILOSEC Take 1 capsule (20 mg total) by mouth 2 (two) times daily before a meal.   Ozempic  (2 MG/DOSE) 8 MG/3ML Sopn Generic drug: Semaglutide  (2 MG/DOSE) Inject 2 mg into the skin once a week. Monday   prasugrel  10 MG Tabs tablet Commonly known as: EFFIENT  Take 1 tablet (10 mg total) by mouth daily.   predniSONE  50 MG tablet Commonly known as: DELTASONE  Take 1 tablet (50 mg total) by mouth daily with breakfast for 3 doses. Start taking on: May 18, 2024   Spiriva  HandiHaler 18 MCG inhalation capsule Generic drug: tiotropium Place 1 capsule (18 mcg total) into inhaler and inhale daily.   triamcinolone  ointment 0.1 % Commonly known as: KENALOG  Apply 1 Application topically 2 (two) times daily. What changed:   when to take this reasons to take this   Ventolin  HFA 108 (90 Base) MCG/ACT inhaler Generic drug: albuterol  INHALE 2 PUFFS INTO THE LUNGS EVERY 6 HOURS AS NEEDED FOR WHEEZING OR FOR SHORTNESS OF BREATH   zinc  oxide 20 % ointment Apply 1 Application topically as needed for irritation.        Discharge Instructions: Please refer to Patient Instructions section of EMR for full details.  Patient was counseled important signs and symptoms that should prompt return to medical care, changes in medications, dietary instructions, activity restrictions, and follow up appointments.   Follow-Up Appointments:  Follow-up Information     [name] Suzann HERO, MD. Go on 05/20/2024.   Specialty: Family Medicine Why: at 11:30 AM for hospital follow up. Contact information: [address] [place] [address] [phone]                 Lorrane Pac, MD 05/17/2024, 2:15 PM PGY-1, [facility] Family Medicine  I agree with the  assessment and plan as documented above.  Stuart Redo, MD PGY-3, [facility] Family Medicine

## 2024-05-20 ENCOUNTER — Encounter: Payer: Self-pay | Admitting: Family Medicine

## 2024-05-20 ENCOUNTER — Ambulatory Visit (INDEPENDENT_AMBULATORY_CARE_PROVIDER_SITE_OTHER): Admitting: Family Medicine

## 2024-05-20 VITALS — BP 137/69 | HR 70 | Ht 67.0 in | Wt 279.8 lb

## 2024-05-20 DIAGNOSIS — D649 Anemia, unspecified: Secondary | ICD-10-CM

## 2024-05-20 DIAGNOSIS — M545 Low back pain, unspecified: Secondary | ICD-10-CM

## 2024-05-20 DIAGNOSIS — J440 Chronic obstructive pulmonary disease with acute lower respiratory infection: Secondary | ICD-10-CM | POA: Diagnosis not present

## 2024-05-20 DIAGNOSIS — Z794 Long term (current) use of insulin: Secondary | ICD-10-CM | POA: Diagnosis not present

## 2024-05-20 DIAGNOSIS — J209 Acute bronchitis, unspecified: Secondary | ICD-10-CM | POA: Diagnosis not present

## 2024-05-20 DIAGNOSIS — E119 Type 2 diabetes mellitus without complications: Secondary | ICD-10-CM | POA: Diagnosis not present

## 2024-05-20 DIAGNOSIS — F32A Depression, unspecified: Secondary | ICD-10-CM | POA: Diagnosis not present

## 2024-05-20 DIAGNOSIS — G8929 Other chronic pain: Secondary | ICD-10-CM

## 2024-05-20 DIAGNOSIS — F339 Major depressive disorder, recurrent, unspecified: Secondary | ICD-10-CM

## 2024-05-20 DIAGNOSIS — Z87891 Personal history of nicotine dependence: Secondary | ICD-10-CM

## 2024-05-20 DIAGNOSIS — Z6841 Body Mass Index (BMI) 40.0 and over, adult: Secondary | ICD-10-CM

## 2024-05-20 MED ORDER — CITALOPRAM HYDROBROMIDE 20 MG PO TABS: 20.0000 mg | ORAL_TABLET | Freq: Every day | ORAL | 1 refills | Status: AC

## 2024-05-20 MED ORDER — TRAMADOL HCL 50 MG PO TABS: 50.0000 mg | ORAL_TABLET | Freq: Two times a day (BID) | ORAL | 0 refills | Status: AC | PRN

## 2024-05-20 NOTE — Assessment & Plan Note (Signed)
 BG rising A1c elevated after steroid use  Discussed he is to monitor CBG If not between 120-150 by end of week, he will call, may need to increase lantus  Post prandial CBG in office 178 on CGM

## 2024-05-20 NOTE — Patient Instructions (Signed)
 It was wonderful to see you today.  Please bring ALL of your medications with you to every visit.   Today we talked about:  -- Travel safely to [place]   -- I will call you with results  -- We will go over your blood sugars at follow up   Please follow up in 3 months   Thank you for choosing [facility] Family Medicine.   Please call [phone] with any questions about today's appointment.  Please be sure to schedule follow up at the front  desk before you leave today.   Suzann Daring, MD  Family Medicine

## 2024-05-21 ENCOUNTER — Ambulatory Visit: Payer: Self-pay | Admitting: Family Medicine

## 2024-05-21 LAB — CBC
Hematocrit: 41.6 % (ref 37.5–51.0)
Hemoglobin: 12.6 g/dL — ABNORMAL LOW (ref 13.0–17.7)
MCH: 25.6 pg — ABNORMAL LOW (ref 26.6–33.0)
MCHC: 30.3 g/dL — ABNORMAL LOW (ref 31.5–35.7)
MCV: 85 fL (ref 79–97)
Platelets: 286 x10E3/uL (ref 150–450)
RBC: 4.92 x10E6/uL (ref 4.14–5.80)
RDW: 15 % (ref 11.6–15.4)
WBC: 13.5 x10E3/uL — ABNORMAL HIGH (ref 3.4–10.8)

## 2024-05-21 LAB — FERRITIN: Ferritin: 10 ng/mL — ABNORMAL LOW (ref 30–400)

## 2024-05-22 ENCOUNTER — Encounter: Payer: Self-pay | Admitting: Family Medicine

## 2024-05-22 MED ORDER — FERROUS SULFATE 325 (65 FE) MG PO TABS: 325.0000 mg | ORAL_TABLET | ORAL | 0 refills | Status: AC

## 2024-05-22 NOTE — Telephone Encounter (Signed)
 Attempted to call patient. Reached voicemail, left generic voicemail to call back.  If he calls back please advise him to start iron every other day. I also recommend going back to see the GI (stomach) doctors---he has had a colonoscopy. I think he may be having a small amount of blood loss from his stomach due to some medications for his heart.   Please schedule  him a visit with me at end of month to discuss.   Suzann Daring, MD  Family Medicine Teaching Service

## 2024-05-22 NOTE — Telephone Encounter (Signed)
 Patient returns call to nurse line.  Discussed below with patient in detail. Advised of [place] to the pharmacy with directions to take every other day.   Advised to FU with GI.   Patient has an apt with PCP on 10/17.

## 2024-05-30 ENCOUNTER — Other Ambulatory Visit: Payer: Self-pay

## 2024-05-30 MED ORDER — TIOTROPIUM BROMIDE MONOHYDRATE 18 MCG IN CAPS: 18.0000 ug | ORAL_CAPSULE | Freq: Every day | RESPIRATORY_TRACT | 12 refills | Status: AC

## 2024-06-24 ENCOUNTER — Encounter: Payer: Self-pay | Admitting: Podiatry

## 2024-06-24 ENCOUNTER — Ambulatory Visit: Admitting: Podiatry

## 2024-06-24 DIAGNOSIS — B351 Tinea unguium: Secondary | ICD-10-CM | POA: Diagnosis not present

## 2024-06-24 DIAGNOSIS — M79675 Pain in left toe(s): Secondary | ICD-10-CM | POA: Diagnosis not present

## 2024-06-24 DIAGNOSIS — M79674 Pain in right toe(s): Secondary | ICD-10-CM | POA: Diagnosis not present

## 2024-06-24 NOTE — Progress Notes (Unsigned)
 Subjective:  Patient ID: [name] [name], male    DOB: Dec 03, 1960,  MRN: [id number]  [name] [name] presents to clinic today for:  Chief Complaint  Patient presents with   Diabetes    [facility] IDDM A1C 9.2. Toenail trim.    Patient notes nails are thick, discolored, elongated and painful in shoegear when trying to ambulate.    PCP is Delores Suzann HERO, MD.  Past Medical History:  Diagnosis Date   Alcoholism [facility])    Anxiety    Arthritis    knees   Asthma    CAD S/P percutaneous coronary angioplasty 01/18/12; 10/2013   a. pRCA 3.5 x 18 vision BMS - 4.2 mm; b. 2/'15: mCx 3.5 x 12  Rebel BMS (3.6-3.7 mm)   Chronic back pain    mid/lower (08/01/2014)   Colon polyps 02/18/2020   May 2021 - 11 polyps that I removed during your recent procedure were proven to be adenomatous. Fortunately all of these polyps were extremely small in size.  These are considered to be pre-cancerous polyps that may have grown into cancers if they had not been removed.  Studies show that at least 20% of women over age 38 and 30% of men over age 13 have pre-cancerous polyps. Based on current nation   COPD (chronic obstructive pulmonary disease) (HCC)    I'm seeing COPD dr now; don't know if I've got it (08/01/2014)   Depression    Dyspnea    GERD (gastroesophageal reflux disease)    Hyperlipidemia    Hypertension    Migraines    once in awhile (08/01/2014); no current problems as of 09/18/23 per pt.   Non-ST elevation myocardial infarction (NSTEMI) (HCC) 10/2013   OSA on CPAP 01/19/2012   Pneumonia    Sleep apnea    uses CPAP nightly   Type II diabetes mellitus [facility])    Past Surgical History:  Procedure Laterality Date   CARDIAC CATHETERIZATION  07/2014   Left Main: Short, large-caliber vessel. Widely patent. Bifurcates into the LAD and Circumflex. Angiographically normal.   CORONARY ANGIOPLASTY WITH STENT PLACEMENT  01/2012; 11/07/2013; 08/01/2014   1 + 2 + 1    CORONARY ANGIOPLASTY WITH STENT PLACEMENT   07/2014   Severe single-vessel disease involving the bifurcation of OM1 and OM 2 with 99% in-stent restenosis in the bare-metal stent placed in the OM 2.   LEFT HEART CATHETERIZATION WITH CORONARY ANGIOGRAM N/A 01/20/2012   Procedure: LEFT HEART CATHETERIZATION WITH CORONARY ANGIOGRAM;  Surgeon: Candyce GORMAN Reek, MD;  Location[facility] C[facility] LAB;  Service: Cardiovascular;  Laterality: N/A;   LEFT HEART CATHETERIZATION WITH CORONARY ANGIOGRAM N/A 11/07/2013   Procedure: LEFT HEART CATHETERIZATION WITH CORONARY ANGIOGRAM;  Surgeon: Candyce GORMAN Reek, MD;  Location: [facility] CATH LAB;  Service: Cardiovascular;  Laterality: N/A;   LEFT HEART CATHETERIZATION WITH CORONARY ANGIOGRAM N/A 08/01/2014   Procedure: LEFT HEART CATHETERIZATION WITH CORONARY ANGIOGRAM;  Surgeon: Alm LELON Clay, MD;  Location: [facility] CATH LAB;  Service: Cardiovascular;  Laterality: N/A;   MULTIPLE TOOTH EXTRACTIONS     PERCUTANEOUS CORONARY STENT INTERVENTION (PCI-S)  01/20/2012   Procedure: PERCUTANEOUS CORONARY STENT INTERVENTION (PCI-S);  Surgeon: Candyce GORMAN Reek, MD;  Location: [facility] CATH LAB;  Service: Cardiovascular;;   PERCUTANEOUS CORONARY STENT INTERVENTION (PCI-S)  11/07/2013   Procedure: PERCUTANEOUS CORONARY STENT INTERVENTION (PCI-S);  Surgeon: Candyce GORMAN Reek, MD;  Location: [facility] CATH LAB;  Service: Cardiovascular;;   TYMPANOMASTOIDECTOMY Left 11/16/2023   Procedure: LEFT OPEN CAVITY TYMPANOMASTOIDECTOMY;  Surgeon: Carlie,  Vaughan, MD;  Location: [facility] OR;  Service: ENT;  Laterality: Left;   Allergies  Allergen Reactions   Shellfish Allergy Nausea And Vomiting    OYSTERS   Yellow Dye #6 (Sunset Yellow) Other (See Comments)    Gi intolerance     Review of Systems: Negative except as noted in the HPI.  Objective:  [name] is a pleasant 63 y.o. male in NAD. AAO x 3.  Vascular Examination: Capillary refill time is 3-5 seconds to toes bilateral. Palpable pedal pulses b/l LE. Digital hair present b/l.  Skin temperature  gradient WNL b/l. No varicosities b/l. No cyanosis noted b/l.   Dermatological Examination: Pedal skin with normal turgor, texture and tone b/l. No open wounds. No interdigital macerations b/l. Toenails x10 are 3mm thick, discolored, dystrophic with subungual debris. There is pain with compression of the nail plates.  They are elongated x10     Latest Ref Rng & Units 05/14/2024   11:30 AM 02/21/2024    9:48 AM 11/13/2023   11:30 AM 07/19/2023   11:23 AM  Hemoglobin A1C  Hemoglobin-A1c 4.8 - 5.6 % 9.2  8.4  8.6  7.9    Assessment/Plan: 1. Pain due to onychomycosis of toenails of both feet    The mycotic toenails were sharply debrided x10 with sterile nail nippers and a power debriding burr to decrease bulk/thickness and length.    Return in about 3 months (around 09/24/2024) for [facility].   Awanda CHARM Imperial, DPM, FACFAS [facility]     [address] [place], [place] [address]                Office [phone]  Fax [phone]

## 2024-06-27 NOTE — Progress Notes (Signed)
    SUBJECTIVE:   CHIEF COMPLAINT: follow up HPI:   [name] [name] is a 63 y.o.  with history notable for possible asthma/COPD, type 2 DM, CAD, OSA on CPAP,  presenting for follow up.   The patient reports overall he is doing well.  He did report he forgot to bring his iron.  He is taking ferrous sulfate  every other day.  He denies melena or hematochezia.  The patient has been on omeprazole  twice daily per pulmonary medicine for suspected reflux since January.  He reports he does not have reflux symptoms.  He had a colonoscopy in June which showed several polyps and diverticulosis.  Pathology revealed adenomatous and sessile serrated polyps.  He is not due for repeat until 2030.  He continues to have iron deficiency anemia despite not having evidence of gastrointestinal bleeding on his lower GI tract evaluation, although it could be from the diverticulosis.  He has not ever had an EGD.  The patient reports his breathing is doing well in his current treatments.  The patient's diabetes he reports he is taking 20 units of Lantus  daily, Ozempic  2 mg weekly metformin .  He is also on an SGLT2 inhibitor.  He denies side effects.  He has not had any lows.  Morning blood sugars range from 110-190.  Over the last week or so his morning sugars been slightly higher due to birthday cake which was white cake with blue and yellow icing. PERTINENT  PMH / PSH/Family/Social History : Coronary disease status post drug-eluting stent on Effient . COPD The patient has a therapy dog that is a [place] [employer] officer  OBJECTIVE:   BP 131/61   Pulse 66   Wt 276 lb 6.4 oz (125.4 kg)   SpO2 96%   BMI 43.29 kg/m   Today's weight:  Last Weight  Most recent update: 06/28/2024  9:57 AM    Weight  125.4 kg (276 lb 6.4 oz)            Review of prior weights: [employer]   06/28/24 0957  Weight: 276 lb 6.4 oz (125.4 kg)    Regular rate and rhythm lungs clear bilaterally to auscultation.  Lower extremities are without  edema but do have postinflammatory hyperpigmentation and evidence of excoriation.  ASSESSMENT/PLAN:   Assessment & Plan Acute bronchitis with COPD (HCC) Doing well on current inhalers.  Continue current therapy. Type 2 diabetes mellitus without complication, with long-term current use of insulin  (HCC) Type 2 diabetes mellitus with hyperglycemia, with long-term current use of insulin  (HCC) Due for A1c in December.  Will he signed for eye exam today. CAD S/P percutaneous coronary angioplasty Denies chest pain.  Continue current therapy. Essential hypertension At goal on lisinopril  2.5 mg OSA on CPAP Wears CPAP Normocytic anemia This is iron deficiency--ferritin was 10.  Repeat today.  He is on iron supplementation.  Given history of DAPT therapy and ongoing anemia despite lower gastrointestinal evaluation will refer to GI --- query if he needs an EGD for further evaluation. Encounter for immunization COVID vaccine given today.    Suzann Daring, MD  Family Medicine Teaching Service  [facility]

## 2024-06-28 ENCOUNTER — Ambulatory Visit: Admitting: Family Medicine

## 2024-06-28 ENCOUNTER — Encounter: Payer: Self-pay | Admitting: Family Medicine

## 2024-06-28 VITALS — BP 131/61 | HR 66 | Wt 276.4 lb

## 2024-06-28 DIAGNOSIS — J440 Chronic obstructive pulmonary disease with acute lower respiratory infection: Secondary | ICD-10-CM | POA: Diagnosis present

## 2024-06-28 DIAGNOSIS — I2510 Atherosclerotic heart disease of native coronary artery without angina pectoris: Secondary | ICD-10-CM

## 2024-06-28 DIAGNOSIS — G4733 Obstructive sleep apnea (adult) (pediatric): Secondary | ICD-10-CM

## 2024-06-28 DIAGNOSIS — E119 Type 2 diabetes mellitus without complications: Secondary | ICD-10-CM | POA: Diagnosis not present

## 2024-06-28 DIAGNOSIS — Z23 Encounter for immunization: Secondary | ICD-10-CM

## 2024-06-28 DIAGNOSIS — Z794 Long term (current) use of insulin: Secondary | ICD-10-CM

## 2024-06-28 DIAGNOSIS — I10 Essential (primary) hypertension: Secondary | ICD-10-CM | POA: Diagnosis not present

## 2024-06-28 DIAGNOSIS — Z9861 Coronary angioplasty status: Secondary | ICD-10-CM

## 2024-06-28 DIAGNOSIS — D649 Anemia, unspecified: Secondary | ICD-10-CM

## 2024-06-28 DIAGNOSIS — H9193 Unspecified hearing loss, bilateral: Secondary | ICD-10-CM

## 2024-06-28 DIAGNOSIS — E1165 Type 2 diabetes mellitus with hyperglycemia: Secondary | ICD-10-CM | POA: Diagnosis not present

## 2024-06-28 DIAGNOSIS — J209 Acute bronchitis, unspecified: Secondary | ICD-10-CM | POA: Diagnosis not present

## 2024-06-28 MED ORDER — LANTUS SOLOSTAR 100 UNIT/ML ~~LOC~~ SOPN: 20.0000 [IU] | PEN_INJECTOR | Freq: Every day | SUBCUTANEOUS | 3 refills | Status: AC

## 2024-06-28 NOTE — Assessment & Plan Note (Signed)
 At goal on lisinopril  2.5 mg

## 2024-06-28 NOTE — Patient Instructions (Addendum)
 It was wonderful to see you today.  Please bring ALL of your medications with you to every visit.   Today we talked about:  [place] work with your sugars  Follow up in December  for your diabetes  Take 1 iron pill every other day     I have referred you to Gastroenterology  to further evaluate your concern. If you have not received a phone call about this appointment within 3-4 weeks, please call our office back at [phone]. Margit Dimes coordinates our referrals and can assist you in this.     Please follow up in 2 months   Thank you for choosing [facility].   Please call [phone] with any questions about today's appointment.  Please be sure to schedule follow up at the front  desk before you leave today.   Suzann Daring, MD  Family Medicine

## 2024-06-28 NOTE — Assessment & Plan Note (Signed)
 Denies chest pain.  Continue current therapy.

## 2024-06-28 NOTE — Assessment & Plan Note (Signed)
 Due for A1c in December.  Will he signed for eye exam today.

## 2024-06-28 NOTE — Assessment & Plan Note (Signed)
 Wears CPAP.

## 2024-06-29 ENCOUNTER — Ambulatory Visit: Payer: Self-pay | Admitting: Family Medicine

## 2024-06-29 LAB — FERRITIN: Ferritin: 14 ng/mL — ABNORMAL LOW (ref 30–400)

## 2024-06-29 LAB — CBC
Hematocrit: 42.1 % (ref 37.5–51.0)
Hemoglobin: 13.2 g/dL (ref 13.0–17.7)
MCH: 26.7 pg (ref 26.6–33.0)
MCHC: 31.4 g/dL — ABNORMAL LOW (ref 31.5–35.7)
MCV: 85 fL (ref 79–97)
Platelets: 309 x10E3/uL (ref 150–450)
RBC: 4.95 x10E6/uL (ref 4.14–5.80)
RDW: 16.8 % — ABNORMAL HIGH (ref 11.6–15.4)
WBC: 7.9 x10E3/uL (ref 3.4–10.8)

## 2024-07-19 ENCOUNTER — Telehealth: Payer: Self-pay

## 2024-07-19 NOTE — Telephone Encounter (Signed)
 Patient LVM on nurse line requesting a call back.   Called patient x2, however no answer.   Will await a return call.

## 2024-07-20 ENCOUNTER — Other Ambulatory Visit: Payer: Self-pay | Admitting: Family Medicine

## 2024-07-20 DIAGNOSIS — I10 Essential (primary) hypertension: Secondary | ICD-10-CM

## 2024-07-20 DIAGNOSIS — Z794 Long term (current) use of insulin: Secondary | ICD-10-CM

## 2024-07-20 DIAGNOSIS — I2510 Atherosclerotic heart disease of native coronary artery without angina pectoris: Secondary | ICD-10-CM

## 2024-07-22 ENCOUNTER — Other Ambulatory Visit: Payer: Self-pay | Admitting: Family Medicine

## 2024-07-23 ENCOUNTER — Telehealth: Payer: Self-pay | Admitting: *Deleted

## 2024-07-23 MED ORDER — TRAMADOL HCL 50 MG PO TABS: 50.0000 mg | ORAL_TABLET | Freq: Two times a day (BID) | ORAL | 0 refills | Status: AC | PRN

## 2024-07-23 NOTE — Telephone Encounter (Signed)
 Rx request for tramadol  HCL 50mg  tablet. Please advise. Zymarion Favorite [place], CMA

## 2024-07-23 NOTE — Telephone Encounter (Signed)
 Patient uses tramadol  for chronic back pain. Cannot take NSAIDs due to coronary disease. PDMP reviewed. Rx refilled  Suzann Daring, MD  Family Medicine Teaching Service

## 2024-08-04 ENCOUNTER — Other Ambulatory Visit: Payer: Self-pay | Admitting: Cardiology

## 2024-08-04 DIAGNOSIS — I2510 Atherosclerotic heart disease of native coronary artery without angina pectoris: Secondary | ICD-10-CM

## 2024-08-15 ENCOUNTER — Encounter: Payer: Self-pay | Admitting: Family Medicine

## 2024-08-15 ENCOUNTER — Telehealth: Payer: Self-pay | Admitting: Family Medicine

## 2024-08-15 ENCOUNTER — Encounter: Payer: Self-pay | Admitting: Pulmonary Disease

## 2024-08-15 ENCOUNTER — Ambulatory Visit: Admitting: Family Medicine

## 2024-08-15 ENCOUNTER — Ambulatory Visit: Admitting: Pulmonary Disease

## 2024-08-15 VITALS — BP 120/76 | HR 60 | Temp 98.5°F | Ht 67.0 in | Wt 279.0 lb

## 2024-08-15 VITALS — BP 110/54 | HR 72 | Ht 67.0 in | Wt 278.6 lb

## 2024-08-15 DIAGNOSIS — Z9861 Coronary angioplasty status: Secondary | ICD-10-CM

## 2024-08-15 DIAGNOSIS — J449 Chronic obstructive pulmonary disease, unspecified: Secondary | ICD-10-CM

## 2024-08-15 DIAGNOSIS — Z87891 Personal history of nicotine dependence: Secondary | ICD-10-CM | POA: Diagnosis not present

## 2024-08-15 DIAGNOSIS — E118 Type 2 diabetes mellitus with unspecified complications: Secondary | ICD-10-CM

## 2024-08-15 DIAGNOSIS — Z794 Long term (current) use of insulin: Secondary | ICD-10-CM | POA: Diagnosis not present

## 2024-08-15 DIAGNOSIS — F331 Major depressive disorder, recurrent, moderate: Secondary | ICD-10-CM | POA: Diagnosis not present

## 2024-08-15 DIAGNOSIS — G4733 Obstructive sleep apnea (adult) (pediatric): Secondary | ICD-10-CM | POA: Diagnosis not present

## 2024-08-15 DIAGNOSIS — I2510 Atherosclerotic heart disease of native coronary artery without angina pectoris: Secondary | ICD-10-CM

## 2024-08-15 LAB — POCT GLYCOSYLATED HEMOGLOBIN (HGB A1C): HbA1c, POC (controlled diabetic range): 8.7 % — AB (ref 0.0–7.0)

## 2024-08-15 MED ORDER — PULSE OXIMETER MISC: 0 refills | Status: AC

## 2024-08-15 NOTE — Assessment & Plan Note (Signed)
 Doing well, no chest pain, continue current medications

## 2024-08-15 NOTE — Progress Notes (Signed)
 [name] [name]    [id number]    1961-04-18  Primary Care Physician:Brown, Suzann HERO, MD  Referring Physician: Delores Suzann HERO, MD [address] [place],  [place] [address]  Chief complaint: Follow-up for COPD, OSA  HPI: 63 y.o. who  has a past medical history of Alcoholism (HCC), Anxiety, Arthritis, Asthma, CAD S/P percutaneous coronary angioplasty (01/18/12; 10/2013), Chronic back pain, Colon polyps (02/18/2020), COPD (chronic obstructive pulmonary disease) (HCC), Depression, Dyspnea, GERD (gastroesophageal reflux disease), Hyperlipidemia, Hypertension, Migraines, Non-ST elevation myocardial infarction (NSTEMI) (HCC) (10/2013), OSA on CPAP (01/19/2012), Pneumonia, Sleep apnea, and Type II diabetes mellitus (HCC).   Discussed the use of AI scribe software for clinical note transcription with the patient, who gave verbal consent to proceed. History of Present Illness [name] [name] is a 63 year old male with COPD who presents for a follow-up visit.  Chronic obstructive pulmonary disease (copd) management - COPD with nightly CPAP use for the past thirteen years - Home oxygen saturations typically 96% to 98% - Uses Dulera  two puffs once daily and Spiriva  once each morning - Since starting Spiriva , only one hospitalization this year compared with multiple admissions last year - Last hospitalization occurred in September  Sleep apnea - He uses his CPAP machine daily, which he has been using for the past 14 years, with pressure adjustments as needed. A sleep study in April 2025 led to an adjustment of his CPAP machine to 19 centimeters of water pressure.    Pets: Dog Occupation: On [employer] Exposures: No mold, hot tub, Jacuzzi.  No feather pillow or comforter Smoking history: 42-pack-year smoker.  Quit in 2022 Travel history:Originally from [place] .  No significant recent travel Relevant family history: No family history of lung disease  Outpatient Encounter Medications as  of 08/15/2024  Medication Sig   aspirin  81 MG chewable tablet Chew 1 tablet (81 mg total) by mouth daily.   atorvastatin  (LIPITOR ) 80 MG tablet TAKE 1 TABLET BY MOUTH DAILY   carvedilol  (COREG ) 3.125 MG tablet TAKE 1 TABLET BY MOUTH TWICE A DAY WITH A MEAL   citalopram  (CELEXA ) 20 MG tablet Take 1 tablet (20 mg total) by mouth daily.   ferrous sulfate  325 (65 FE) MG tablet Take 1 tablet (325 mg total) by mouth every other day.   hydrocortisone  2.5 % ointment Apply 1 Application topically as needed (skin irritation).   insulin  glargine (LANTUS  SOLOSTAR) 100 UNIT/ML Solostar Pen Inject 20 Units into the skin daily.   JARDIANCE  25 MG TABS tablet TAKE 1 TABLET BY MOUTH DAILY   ketoconazole  (NIZORAL ) 2 % cream Apply 1 Application topically daily. BETWEEN TOES for 1 week (Patient taking differently: Apply 1 Application topically daily as needed for irritation. BETWEEN TOES for 1 week)   lisinopril  (ZESTRIL ) 2.5 MG tablet Take 2.5 mg by mouth daily.   melatonin 5 MG TABS Take 10 mg by mouth at bedtime.   metFORMIN  (GLUCOPHAGE ) 1000 MG tablet TAKE 1 TABLET BY MOUTH TWICE A DAY WITH A MEAL   mometasone -formoterol  (DULERA ) 200-5 MCG/ACT AERO INHALE 2 PUFFS INTO THE LUNGS EVERY MORNING AND AT BEDTIME   Multiple Vitamin (MULTIVITAMIN) tablet Take 1 tablet by mouth daily. Centrum Men 50+   nitroGLYCERIN  (NITROSTAT ) 0.4 MG SL tablet DISSOLVE 1 TABLET UNDER THE TONGUE FOR CHEST PAIN. IF PAIN REMAINS AFTER 5 MINUTES, CALL 911 AND REPEAT DOSE. MAX 3 TABLETS IN 15 MINUTES   omeprazole  (PRILOSEC) 20 MG capsule Take 1 capsule (20 mg total)  by mouth 2 (two) times daily before a meal.   prasugrel  (EFFIENT ) 10 MG TABS tablet TAKE 1 TABLET BY MOUTH DAILY   Semaglutide , 2 MG/DOSE, (OZEMPIC , 2 MG/DOSE,) 8 MG/3ML SOPN Inject 2 mg into the skin once a week. Monday   tiotropium (SPIRIVA  HANDIHALER) 18 MCG inhalation capsule Place 1 capsule (18 mcg total) into inhaler and inhale daily.   triamcinolone  ointment (KENALOG ) 0.1 %  Apply 1 Application topically 2 (two) times daily. (Patient taking differently: Apply 1 Application topically 2 (two) times daily as needed (APPLY TP FEET).)   VENTOLIN  HFA 108 (90 Base) MCG/ACT inhaler INHALE 2 PUFFS INTO THE LUNGS EVERY 6 HOURS AS NEEDED FOR WHEEZING OR FOR SHORTNESS OF BREATH   zinc  oxide 20 % ointment Apply 1 Application topically as needed for irritation.   nystatin  cream (MYCOSTATIN ) Apply 1 Application topically 2 (two) times daily as needed for dry skin. (Patient not taking: Reported on 08/15/2024)   No facility-administered encounter medications on file as of 08/15/2024.   Vitals:   08/15/24 1409  BP: 120/76  Pulse: 60  Temp: 98.5 F (36.9 C)  Height: 5' 7 (1.702 m)  Weight: 279 lb (126.6 kg)  SpO2: 98%  TempSrc: Oral  BMI (Calculated): 43.69    Physical Exam GEN: No acute distress. CV: Regular rate and rhythm, no murmurs. LUNGS: Clear to auscultation bilaterally, normal respiratory effort. SKIN JOINTS: Warm and dry, no rash.   Data Reviewed: Imaging: CTA 04/11/2023-no pulmonary embolism, centrilobular thickening.  Stable right middle lobe perifissural nodule  Screening CT chest 04/01/2024-mild emphysema, diffuse bronchial wall thickening, subsegmental scarring in the lingula.  Subcentimeter pulmonary nodule. I have reviewed the images personally.  PFTs: 03/06/2024 FVC 2.80 [6 6%], FEV1 1.55 [49%], F/F56, TLC 7.23 [113%], DLCO 24.95 [100%] Moderate obstructive airway disease  Labs: WBC 13.3, hemoglobin 14.1, platelets 269, eos 0%  Sleep PSG, CPAP titration 12/19/2023 Baseline AHI 110.6/h desaturation to 80% Recommend CPAP at 19 cm water or AutoPap 10-20 Assessment & Plan Chronic obstructive pulmonary disease COPD is well-controlled with current treatment regimen. Oxygen saturation is consistently between 94-98%. No recent hospitalizations this year, indicating improved stability since starting Spiriva . - Continue Dulera  inhaler, one puff in the  morning and one puff in the evening. - Continue Spiriva  inhaler once daily in the morning. - Increase Dulera  to two puffs in the morning and two puffs in the evening if breathing worsens. - Ordered finger oximeter through pharmacy.  Obstructive sleep apnea Recently had his CPAP pressure adjusted to 19 cmH2O after titration study Managed with CPAP therapy, which he has been using consistently for 12-13 years. No recent issues reported with CPAP use. - Continue CPAP therapy nightly.  Will try to get download from DME company as we are unable to get it from the SD card   Former smoker -On lung cancer screening protocol per his primary care.  Recommendations: Continue Dulera , Spiriva  Albuterol  as needed CPAP for OSA  Lonna Coder MD [facility] 08/15/2024, 2:23 PM  CC: Delores Suzann HERO, MD

## 2024-08-15 NOTE — Patient Instructions (Addendum)
 It was wonderful to see you today.  Please bring ALL of your medications with you to every visit.   Today we talked about:   Continue your medications at the same dose  You can take your insulin  in the morning every day  Continue 20 units of insulin   Scan your sensor every single day   Check your sugar if your sensor is beeping low and eat a small snack   My recommendation is that you increase your activity to THREE days a week at the gym   Call to schedule with the stomach doctors  [facility] [address], [place], [place] [address] Phone: [phone]  Thank you for choosing [facility].   Please call [phone] with any questions about today's appointment.  Please be sure to schedule follow up at the front  desk before you leave today.   Suzann Daring, MD  Family Medicine

## 2024-08-15 NOTE — Assessment & Plan Note (Addendum)
 Previously referred to ophtho A1C today- above goal Discussed at length  Hesistant to increase lantus  with low values He is maximized non-insulin  therapies We decided - Increase BG checking and scanning sensor - Increase gym to three times per week - Insulin  ALWAYS in AM--okay to take with Ozempic  - No skipping meals  - Follow up in 1 month with Dr. Fleta no lows, please increase lantus 

## 2024-08-15 NOTE — Patient Instructions (Signed)
  VISIT SUMMARY: You had a follow-up visit to manage your COPD and sleep apnea. Your COPD is well-controlled with your current medications, and your sleep apnea is being effectively managed with CPAP therapy.  YOUR PLAN: CHRONIC OBSTRUCTIVE PULMONARY DISEASE (COPD): Your COPD is well-controlled with your current treatment. Your oxygen levels are good, and you have had fewer hospitalizations since starting Spiriva . -Continue using the Dulera  inhaler, one puff in the morning and one puff in the evening. -Continue using the Spiriva  inhaler once daily in the morning. -If your breathing worsens, increase Dulera  to two puffs in the morning and two puffs in the evening. -A finger oximeter has been ordered for you through the pharmacy.  OBSTRUCTIVE SLEEP APNEA: Your sleep apnea is being managed well with CPAP therapy, which you have been using consistently for over 12 years. -Continue using CPAP therapy every night.                           Contains text generated by [employer].

## 2024-08-15 NOTE — Progress Notes (Signed)
    SUBJECTIVE:   CHIEF COMPLAINT: diabetes check HPI:   [name] [name] is a 63 y.o.  with history notable for type 2 DM, remote  CAD, and IDA  presenting for follow up.  He reports he is doing okay. Loves the [place]. Will bring Jitzy to next visit (service dog).   Type 2 DM Taking lantus  20 u in AM (but takes in PM on days he injects Ozempic ), 2 mg ozempic , metformin  max dose and jardiance . Has had hypoglycemia to 50s with symptoms. Does skip lunch. Is only working out 1 day a week for 15 minutes.     PERTINENT  PMH / PSH/Family/Social History : former smoker, CT due in July Type 2 Dm  OBJECTIVE:   BP (!) 110/54   Pulse 72   Ht 5' 7 (1.702 m)   Wt 278 lb 9.6 oz (126.4 kg)   SpO2 96%   BMI 43.63 kg/m   Today's weight:  Last Weight  Most recent update: 08/15/2024  9:41 AM    Weight  126.4 kg (278 lb 9.6 oz)            Review of prior weights: [employer]   08/15/24 0940  Weight: 278 lb 9.6 oz (126.4 kg)   RRR Lungs clear bilaterally   ASSESSMENT/PLAN:   Assessment & Plan Type 2 diabetes mellitus with complication, with long-term current use of insulin  (HCC) Previously referred to ophtho A1C today- above goal Discussed at length  Hesistant to increase lantus  with low values He is maximized non-insulin  therapies We decided - Increase BG checking and scanning sensor - Increase gym to three times per week - Insulin  ALWAYS in AM--okay to take with Ozempic  - No skipping meals  - Follow up in 1 month with Dr. Fleta no lows, please increase lantus   Moderate recurrent major depression (HCC) Doing well on citalopram  20 mg  CAD S/P percutaneous coronary angioplasty Doing well, no chest pain, continue current medications  COPD, moderate (HCC) Rx pulse oximeter Doing well UTD on vaccines Reviewed inhalers     Suzann Daring, MD  Family Medicine Teaching Service  [facility]

## 2024-08-15 NOTE — Telephone Encounter (Signed)
 Patient has need for DME. I have ordered pulse ox. I am routing note for [facility] RN Pool.   Suzann CHRISTELLA Daring, MD

## 2024-08-16 NOTE — Telephone Encounter (Signed)
Order updated. Dorris Singh, MD  Family Medicine Teaching Service

## 2024-08-16 NOTE — Telephone Encounter (Signed)
 Community message sent to [employer] to determine if this is something that they can provide/insurance coverage.   [name] JAYSON English, RN

## 2024-08-16 NOTE — Addendum Note (Signed)
 Addended by: DELORES, Chayanne Filippi on: 08/16/2024 05:34 PM   Modules accepted: Orders

## 2024-08-19 NOTE — Telephone Encounter (Signed)
 Community message sent to [employer] with updated order information.   Chiquita JAYSON English, RN

## 2024-08-22 ENCOUNTER — Telehealth: Payer: Self-pay | Admitting: Cardiology

## 2024-08-22 ENCOUNTER — Telehealth: Payer: Self-pay

## 2024-08-22 NOTE — Telephone Encounter (Signed)
°  Patient Consent for Virtual Visit        [name] [name] has provided verbal consent on 08/22/2024 for a virtual visit (video or telephone).   CONSENT FOR VIRTUAL VISIT FOR:  [name] [name]  By participating in this virtual visit I agree to the following:  I hereby voluntarily request, consent and authorize [facility] and its employed or contracted physicians, physician assistants, nurse practitioners or other licensed health care professionals (the Practitioner), to provide me with telemedicine health care services (the Services) as deemed necessary by the treating Practitioner. I acknowledge and consent to receive the Services by the Practitioner via telemedicine. I understand that the telemedicine visit will involve communicating with the Practitioner through live audiovisual communication technology and the disclosure of certain medical information by electronic transmission. I acknowledge that I have been given the opportunity to request an in-person assessment or other available alternative prior to the telemedicine visit and am voluntarily participating in the telemedicine visit.  I understand that I have the right to withhold or withdraw my consent to the use of telemedicine in the course of my care at any time, without affecting my right to future care or treatment, and that the Practitioner or I may terminate the telemedicine visit at any time. I understand that I have the right to inspect all information obtained and/or recorded in the course of the telemedicine visit and may receive copies of available information for a reasonable fee.  I understand that some of the potential risks of receiving the Services via telemedicine include:  Delay or interruption in medical evaluation due to technological equipment failure or disruption; Information transmitted may not be sufficient (e.g. poor resolution of images) to allow for appropriate medical decision making by the Practitioner;  and/or  In rare instances, security protocols could fail, causing a breach of personal health information.  Furthermore, I acknowledge that it is my responsibility to provide information about my medical history, conditions and care that is complete and accurate to the best of my ability. I acknowledge that Practitioner's advice, recommendations, and/or decision may be based on factors not within their control, such as incomplete or inaccurate data provided by me or distortions of diagnostic images or specimens that may result from electronic transmissions. I understand that the practice of medicine is not an exact science and that Practitioner makes no warranties or guarantees regarding treatment outcomes. I acknowledge that a copy of this consent can be made available to me via my patient portal [facility] MyChart), or I can request a printed copy by calling the office of [facility].    I understand that my insurance will be billed for this visit.   I have read or had this consent read to me. I understand the contents of this consent, which adequately explains the benefits and risks of the Services being provided via telemedicine.  I have been provided ample opportunity to ask questions regarding this consent and the Services and have had my questions answered to my satisfaction. I give my informed consent for the services to be provided through the use of telemedicine in my medical care

## 2024-08-22 NOTE — Telephone Encounter (Signed)
° °  Name: [name] [name]  DOB: Sep 04, 1961  MRN: [id number]  Primary Cardiologist: Peter Jordan, MD   Preoperative team, please contact this patient and set up a phone call appointment for further preoperative risk assessment. Please obtain consent and complete medication review. Thank you for your help.  I confirm that guidance regarding antiplatelet and oral anticoagulation therapy has been completed and, if necessary, noted below.  Per Dr. Jordan: OK to hold Effient  but would continue ASA through procedure  I also confirmed the patient resides in the state of [place] . As per [facility] Medical Board telemedicine laws, the patient must reside in the state in which the provider is licensed.   Lum LITTIE Louis, NP 08/22/2024, 3:58 PM [facility]

## 2024-08-22 NOTE — Telephone Encounter (Signed)
 Pt scheduled for VV on 12/17

## 2024-08-22 NOTE — Telephone Encounter (Signed)
° °  Pre-operative Risk Assessment    Patient Name: [name] [name]  DOB: 1960/12/13 MRN: [id number]   Date of last office visit: 05/09/24 Date of next office visit: 11/05/24   Request for Surgical Clearance    Procedure:  right ear open cavity mastoidectomy  Date of Surgery:  Clearance TBD                                Surgeon:  Dr. Carlie Socks Group or Practice Name:  [facility] Phone number:  [phone] Fax number:  [phone]   Type of Clearance Requested:   - Medical  - Pharmacy:  Hold Prasugrel  (Effient ) TBD   Type of Anesthesia:  General    Additional requests/questions:     Bonney Barbee DELENA Claudene   08/22/2024, 12:46 PM

## 2024-08-28 ENCOUNTER — Ambulatory Visit: Attending: Cardiology | Admitting: Physician Assistant

## 2024-08-28 DIAGNOSIS — Z01810 Encounter for preprocedural cardiovascular examination: Secondary | ICD-10-CM

## 2024-08-28 NOTE — Progress Notes (Signed)
 Virtual Visit via Telephone Note   Because of [name] [name] co-morbid illnesses, he is at least at moderate risk for complications without adequate follow up.  This format is felt to be most appropriate for this patient at this time.  Due to technical limitations with video connection (technology), today's appointment will be conducted as an audio only telehealth visit, and Saadiq Poche verbally agreed to proceed in this manner.   All issues noted in this document were discussed and addressed.  No physical exam could be performed with this format.  Evaluation Performed:  Preoperative cardiovascular risk assessment _____________   Date:  08/28/2024   Patient ID:  [name] [name], DOB April 15, 1961, MRN [id number] Patient Location:  Home Provider location:   Office  Primary Care Provider:  Delores Suzann HERO, MD Primary Cardiologist:  Peter Jordan, MD  Chief Complaint / Patient Profile   63 y.o. y/o male with a h/o COPD, GERD, NSTEMI in February 2015 treated with BMS to LCx and OM 2, obesity, tobacco use, hyperlipidemia, hypertension, alcoholism, depression, OSA on CPAP, type 2 diabetes mellitus who is pending right ear open cavity mastoidectomy and presents today for telephonic preoperative cardiovascular risk assessment.  History of Present Illness    [name] [name] is a 63 y.o. male who presents via audio/video conferencing for a telehealth visit today.  Pt was last seen in cardiology clinic on 05/09/2024 by Peter Jordan, MD.  At that time Ramone Gander was doing well.  The patient is now pending procedure as outlined above. Since his last visit, he tells me that he is feeling great without any chest pain or shortness of breath.  He has no trouble walking 1-2 blocks or going up and down a flight of stairs.  For this reason he has surpassed 4 METS on the DASI.   Per Dr. Jordan: OK to hold Effient  but would continue ASA through procedure [place].   We recommend a 5 to 7-day hold for both Effient   and aspirin  81 mg.  Would recommend resuming when medically safe to do so.  Past Medical History    Past Medical History:  Diagnosis Date   Alcoholism (HCC)    Anxiety    Arthritis    knees   Asthma    CAD S/P percutaneous coronary angioplasty 01/18/12; 10/2013   a. pRCA 3.5 x 18 vision BMS - 4.2 mm; b. 2/'15: mCx 3.5 x 12  Rebel BMS (3.6-3.7 mm)   Chronic back pain    mid/lower (08/01/2014)   Colon polyps 02/18/2020   May 2021 - 11 polyps that I removed during your recent procedure were proven to be adenomatous. Fortunately all of these polyps were extremely small in size.  These are considered to be pre-cancerous polyps that may have grown into cancers if they had not been removed.  Studies show that at least 20% of women over age 53 and 30% of men over age 33 have pre-cancerous polyps. Based on current [place]   COPD (chronic obstructive pulmonary disease) (HCC)    I'm seeing COPD dr now; don't know if I've got it (08/01/2014)   Depression    Dyspnea    GERD (gastroesophageal reflux disease)    Hyperlipidemia    Hypertension    Migraines    once in awhile (08/01/2014); no current problems as of 09/18/23 per pt.   Non-ST elevation myocardial infarction (NSTEMI) (HCC) 10/2013   OSA on CPAP 01/19/2012   Pneumonia    Sleep apnea    uses  CPAP nightly   Type II diabetes mellitus [facility])    Past Surgical History:  Procedure Laterality Date   CARDIAC CATHETERIZATION  07/2014   Left Main: Short, large-caliber vessel. Widely patent. Bifurcates into the LAD and Circumflex. Angiographically normal.   CORONARY ANGIOPLASTY WITH STENT PLACEMENT  01/2012; 11/07/2013; 08/01/2014   1 + 2 + 1    CORONARY ANGIOPLASTY WITH STENT PLACEMENT  07/2014   Severe single-vessel disease involving the bifurcation of OM1 and OM 2 with 99% in-stent restenosis in the bare-metal stent placed in the OM 2.   LEFT HEART CATHETERIZATION WITH CORONARY ANGIOGRAM N/A 01/20/2012   Procedure: LEFT HEART  CATHETERIZATION WITH CORONARY ANGIOGRAM;  Surgeon: Candyce GORMAN Reek, MD;  Location: [facility] CATH LAB;  Service: Cardiovascular;  Laterality: N/A;   LEFT HEART CATHETERIZATION WITH CORONARY ANGIOGRAM N/A 11/07/2013   Procedure: LEFT HEART CATHETERIZATION WITH CORONARY ANGIOGRAM;  Surgeon: Candyce GORMAN Reek, MD;  Location: [facility] CATH LAB;  Service: Cardiovascular;  Laterality: N/A;   LEFT HEART CATHETERIZATION WITH CORONARY ANGIOGRAM N/A 08/01/2014   Procedure: LEFT HEART CATHETERIZATION WITH CORONARY ANGIOGRAM;  Surgeon: [name] LELON Clay, MD;  Location: [facility] CATH LAB;  Service: Cardiovascular;  Laterality: N/A;   MULTIPLE TOOTH EXTRACTIONS     PERCUTANEOUS CORONARY STENT INTERVENTION (PCI-S)  01/20/2012   Procedure: PERCUTANEOUS CORONARY STENT INTERVENTION (PCI-S);  Surgeon: Candyce GORMAN Reek, MD;  Location: [facility] CATH LAB;  Service: Cardiovascular;;   PERCUTANEOUS CORONARY STENT INTERVENTION (PCI-S)  11/07/2013   Procedure: PERCUTANEOUS CORONARY STENT INTERVENTION (PCI-S);  Surgeon: Candyce GORMAN Reek, MD;  Location: [facility] CATH LAB;  Service: Cardiovascular;;   TYMPANOMASTOIDECTOMY Left 11/16/2023   Procedure: LEFT OPEN CAVITY TYMPANOMASTOIDECTOMY;  Surgeon: Carlie Clark, MD;  Location: [facility] OR;  Service: ENT;  Laterality: Left;    Allergies  Allergies[1]  Home Medications    Prior to Admission medications  Medication Sig Start Date End Date Taking? Authorizing Provider  aspirin  81 MG chewable tablet Chew 1 tablet (81 mg total) by mouth daily. 10/04/13   Fredirick Glenys GORMAN, MD  atorvastatin  (LIPITOR ) 80 MG tablet TAKE 1 TABLET BY MOUTH DAILY 04/24/24   Delores Suzann HERO, MD  carvedilol  (COREG ) 3.125 MG tablet TAKE 1 TABLET BY MOUTH TWICE A DAY WITH A MEAL 07/22/24   Delores Suzann HERO, MD  citalopram  (CELEXA ) 20 MG tablet Take 1 tablet (20 mg total) by mouth daily. 05/20/24   Delores Suzann HERO, MD  ferrous sulfate  325 (65 FE) MG tablet Take 1 tablet (325 mg total) by mouth every other day. 05/22/24   Delores Suzann HERO, MD   hydrocortisone  2.5 % ointment Apply 1 Application topically as needed (skin irritation). 02/18/20   [provider]  insulin  glargine (LANTUS  SOLOSTAR) 100 UNIT/ML Solostar Pen Inject 20 Units into the skin daily. 06/28/24   Delores Suzann HERO, MD  JARDIANCE  25 MG TABS tablet TAKE 1 TABLET BY MOUTH DAILY 07/22/24   Delores Suzann HERO, MD  ketoconazole  (NIZORAL ) 2 % cream Apply 1 Application topically daily. BETWEEN TOES for 1 week Patient taking differently: Apply 1 Application topically daily as needed for irritation. BETWEEN TOES for 1 week 03/11/24   Delores Suzann HERO, MD  lisinopril  (ZESTRIL ) 2.5 MG tablet Take 2.5 mg by mouth daily.    [provider]  melatonin 5 MG TABS Take 10 mg by mouth at bedtime.    [provider]  metFORMIN  (GLUCOPHAGE ) 1000 MG tablet TAKE 1 TABLET BY MOUTH TWICE A DAY WITH A MEAL 01/25/24   Delores Suzann  CHRISTELLA, MD  Misc. Devices (PULSE OXIMETER) MISC Please give finger oximeter 08/15/24   Mannam, Praveen, MD  mometasone -formoterol  (DULERA ) 200-5 MCG/ACT AERO INHALE 2 PUFFS INTO THE LUNGS EVERY MORNING AND AT BEDTIME 05/17/24   Delores Suzann CHRISTELLA, MD  Multiple Vitamin (MULTIVITAMIN) tablet Take 1 tablet by mouth daily. Centrum Men 50+    [provider]  nitroGLYCERIN  (NITROSTAT ) 0.4 MG SL tablet DISSOLVE 1 TABLET UNDER THE TONGUE FOR CHEST PAIN. IF PAIN REMAINS AFTER 5 MINUTES, CALL 911 AND REPEAT DOSE. MAX 3 TABLETS IN 15 MINUTES 11/01/21   Jeanelle Layman CROME, MD  nystatin  cream (MYCOSTATIN ) Apply 1 Application topically 2 (two) times daily as needed for dry skin. Patient not taking: Reported on 08/15/2024    [provider]  omeprazole  (PRILOSEC) 20 MG capsule Take 1 capsule (20 mg total) by mouth 2 (two) times daily before a meal. 03/06/24 03/06/25  Mannam, Praveen, MD  prasugrel  (EFFIENT ) 10 MG TABS tablet TAKE 1 TABLET BY MOUTH DAILY 08/05/24   Jordan, Peter M, MD  Semaglutide , 2 MG/DOSE, (OZEMPIC , 2 MG/DOSE,) 8 MG/3ML SOPN Inject 2 mg into  the skin once a week. Monday 11/10/23   Delores Suzann CHRISTELLA, MD  tiotropium (SPIRIVA  HANDIHALER) 18 MCG inhalation capsule Place 1 capsule (18 mcg total) into inhaler and inhale daily. 05/30/24   Delores Suzann CHRISTELLA, MD  triamcinolone  ointment (KENALOG ) 0.1 % Apply 1 Application topically 2 (two) times daily. Patient taking differently: Apply 1 Application topically 2 (two) times daily as needed (APPLY TP FEET). 03/11/24   Delores Suzann CHRISTELLA, MD  VENTOLIN  HFA 108 (90 Base) MCG/ACT inhaler INHALE 2 PUFFS INTO THE LUNGS EVERY 6 HOURS AS NEEDED FOR WHEEZING OR FOR SHORTNESS OF BREATH 07/23/24   Delores Suzann CHRISTELLA, MD  zinc  oxide 20 % ointment Apply 1 Application topically as needed for irritation. 03/12/24   Delores Suzann CHRISTELLA, MD    Physical Exam    Vital Signs:  [name] [name] does not have vital signs available for review today.  Given telephonic nature of communication, physical exam is limited. AAOx3. NAD. Normal affect.  Speech and respirations are unlabored.  Accessory Clinical Findings    None  Assessment & Plan    1.  Preoperative Cardiovascular Risk Assessment:  Mr. Toto perioperative risk of a major cardiac event is 6.6% according to the Revised Cardiac Risk Index (RCRI).  Therefore, he is at high risk for perioperative complications.   His functional capacity is good at 5.99 METs according to the [place] Activity Status Index (DASI). Recommendations: According to ACC/AHA guidelines, no further cardiovascular testing needed.  The patient may proceed to surgery at acceptable risk.   Antiplatelet and/or Anticoagulation Recommendations: Aspirin  can be held for 5-7 days prior to his surgery.  Please resume Aspirin  post operatively when it is felt to be safe from a bleeding standpoint.  Prasugrel  (Effient ) can be held for 5-7 days prior to his surgery and resumed as soon as possible post op.  The patient was advised that if he develops new symptoms prior to surgery to contact our office to arrange for a  follow-up visit, and he verbalized understanding.   A copy of this note will be routed to requesting surgeon.  Time:   Today, I have spent 6 minutes with the patient with telehealth technology discussing medical history, symptoms, and management plan.     Orren LOISE Fabry, PA-C  08/28/2024, 12:34 PM      [1]  Allergies Allergen Reactions   Shellfish Allergy  Nausea And Vomiting    OYSTERS   Yellow Dye #6 (Sunset Yellow) Other (See Comments)    Gi intolerance

## 2024-08-29 ENCOUNTER — Telehealth: Payer: Self-pay

## 2024-08-29 NOTE — Telephone Encounter (Signed)
 Patient calls nurse line in regards to low blood sugar.  He reports his fasting sugar this AM ~1.5 hours ago was 54.  He reports he ate some Fritos ~ 30 minutes ago, he reports his sugar increased to 74.  He reports he has taken Metformin  and Jardiance  today. He reports he took his Ozempic  injection yesterday.  He reports he is asymptomatic. No dizziness, confusion or blurry vision.  Precautions discussed with patient.   Will forward to PCP.

## 2024-08-29 NOTE — Telephone Encounter (Signed)
 Please call and ask if he injected insulin  today. He should reduce his dose to 15 units.   Please also confirm he checked his sugar on his finger and not just through the CGM-- there are some sensor errors currently.   Suzann Daring, MD  Family Medicine Teaching Service

## 2024-08-29 NOTE — Telephone Encounter (Signed)
 Called patient.   Discussed reduced dose of 15 units today.   Advised to continue to monitor sugars at home.  ED precautions discussed.   Of note, patient does not have a CGM.

## 2024-09-10 ENCOUNTER — Telehealth: Payer: Self-pay | Admitting: Pharmacist

## 2024-09-10 NOTE — Telephone Encounter (Signed)
 Attempted to contact patient for request to reschedule due to schedule conflict.   Patient agreed to reschedule later in the same day as previously scheduled.  New visit 09/19/24 at 3:00 PM  Total time with patient call and documentation of interaction: 6 minutes.

## 2024-09-19 ENCOUNTER — Encounter: Payer: Self-pay | Admitting: Pharmacist

## 2024-09-19 ENCOUNTER — Telehealth: Payer: Self-pay

## 2024-09-19 ENCOUNTER — Ambulatory Visit: Admitting: Pharmacist

## 2024-09-19 VITALS — BP 127/70 | HR 70 | Wt 271.8 lb

## 2024-09-19 DIAGNOSIS — Z794 Long term (current) use of insulin: Secondary | ICD-10-CM | POA: Diagnosis not present

## 2024-09-19 DIAGNOSIS — E118 Type 2 diabetes mellitus with unspecified complications: Secondary | ICD-10-CM

## 2024-09-19 MED ORDER — FREESTYLE LIBRE 3 PLUS SENSOR MISC: 11 refills | Status: AC

## 2024-09-19 NOTE — Telephone Encounter (Signed)
 Patient contacted for follow-up of readings being low this AM.   Since last contact patient reports no symptoms of low glucose but has stabile readings of ~ 69 at this time.   He finished a peanut butter and jelly sandwich a few minutes ago.   He plans to bring meter and all medications to visit later today.  Total time with patient call and documentation of interaction: 8 minutes.

## 2024-09-19 NOTE — Assessment & Plan Note (Addendum)
 Diabetes longstanding currently uncontrolled. Patient is able to verbalize appropriate hypoglycemia management plan. Medication adherence appears good.  - Exchanged Libre 2 to [place] 3 sensor during visit today.  - Continued basal insulin  Lantus  (insulin  glargine) 20 units daily.  - Continued GLP-1 Ozempic  (semaglutide ) 2mg  once weekly.   - Continued SGLT2-I Jardiance  (empagliflozin ) 25 mg. Counseled on sick day rules. - Continued metformin  1000 mg BID.  - Patient educated on purpose, proper use, and potential adverse effects.  - Extensively discussed pathophysiology of diabetes, recommended lifestyle interventions, dietary effects on glucose control.  - Counseled on s/sx of and management of hypoglycemia.

## 2024-09-19 NOTE — Telephone Encounter (Signed)
 Patient calls nurse line reporting low CGM readings.   He reports last night before bed his reader read 68. He reports this was after having mac and cheese and sausage steak.  He reports this morning his fasting read 69.  He reports he is unable to prick his finger for glucose read, as he does not have the supplies.   He reports he took all of his medications this morning as normal. However, he reports he has not taken his Lantus  yet due to the CGM reading.  He reports he feels totally fine and is asymptomatic.   Patient has an apt with pharmacy this afternoon at 3pm.   Advised will forward to Dr. Koval and PCP for Lantus  advisement.   Precautions discussed with patient.

## 2024-09-19 NOTE — Progress Notes (Signed)
 "   S:     Chief Complaint  Patient presents with   Medication Management    Diabetes - Libre 3 Start   64 y.o. male who presents for diabetes evaluation, education, and management. Patient arrives in good spirits and presents without any assistance. Majority of today's visit was spent transitioning the patient from [place] 2 to [place] 3.  Patient was referred and last seen by Primary Care Provider, Dr. Alba, on 08/15/2024.   PMH is significant for HTN, GERD, HLD, T2DM, depression, CAD, obesity, OSA, reactive airway disease.   Current diabetes medications include: Jardiance  (empagliflozin ) 25 mg daily, metformin  1000 mg BID, Lantus  (insulin  glargine) 20 units daily, Ozempic  (semaglutide ) 2 mg weekly  Current hypertension medications include: lisinopril  2.5 mg daily, carvedilol  3.125 mg BID  Current hyperlipidemia medications include: atorvastatin  80 mg daily   Insurance coverage: Medicaid   O:   Review of Systems  All other systems reviewed and are negative.   Physical Exam Constitutional:      Appearance: Normal appearance.  Neurological:     Mental Status: He is alert.  Psychiatric:        Mood and Affect: Mood normal.        Behavior: Behavior normal.    Libre3 CGM Download today from 09/06/2024-09/19/2024 % Time CGM is active: 32% Average Glucose: 157 mg/dL Glucose Variability: 61.2% (goal <36%) Time in Goal:  - Time in range 70-180: 58% - Time above range: 35% - Time below range: 7% Observed patterns: lack of data from [place] 2 from various parts of the day is problematic. This will be eliminated with Libre 3.   Lab Results  Component Value Date   HGBA1C 8.7 (A) 08/15/2024   Vitals:   09/19/24 1530  BP: 127/70  Pulse: 70  SpO2: 97%    Lipid Panel     Component Value Date/Time   CHOL 95 (L) 03/11/2024 0929   TRIG 120 03/11/2024 0929   HDL 33 (L) 03/11/2024 0929   CHOLHDL 2.9 03/11/2024 0929   CHOLHDL 3.7 03/29/2016 1227   VLDL 53 (H) 03/29/2016 1227    LDLCALC 40 03/11/2024 0929   LDLDIRECT 70 09/02/2020 1057    Clinical Atherosclerotic Cardiovascular Disease (ASCVD): Yes  The ASCVD Risk score (Arnett DK, et al., 2019) failed to calculate for the following reasons:   Risk score cannot be calculated because patient has a medical history suggesting prior/existing ASCVD   * - Cholesterol units were assumed  Lab Results  Component Value Date   CHOL 95 (L) 03/11/2024   HDL 33 (L) 03/11/2024   LDLCALC 40 03/11/2024   LDLDIRECT 70 09/02/2020   TRIG 120 03/11/2024   CHOLHDL 2.9 03/11/2024    Lab Results  Component Value Date   CREATININE 0.73 05/16/2024   BUN 15 05/16/2024   NA 135 05/16/2024   K 3.8 05/16/2024   CL 101 05/16/2024   CO2 23 05/16/2024    Medications Reviewed Today     Reviewed by Jantz Main G, RPH-CPP (Pharmacist) on 09/19/24 at 1537  Med List Status: <None>   Medication Order Taking? Sig Documenting Provider Last Dose Status Informant  aspirin  81 MG chewable tablet [id number] Yes Chew 1 tablet (81 mg total) by mouth daily. Fredirick Glenys RAMAN, MD  Active Self, Pharmacy Records           Med Note KERRIN, MELISSA R   Fri May 10, 2024  2:28 PM)    atorvastatin  (LIPITOR ) 80 MG tablet [id number] Yes  TAKE 1 TABLET BY MOUTH DAILY Delores Suzann HERO, MD  Active Self, Pharmacy Records  carvedilol  (COREG ) 3.125 MG tablet [id number] Yes TAKE 1 TABLET BY MOUTH TWICE A DAY WITH A MEAL Delores Suzann HERO, MD  Active   citalopram  (CELEXA ) 20 MG tablet [id number] Yes Take 1 tablet (20 mg total) by mouth daily. Delores Suzann HERO, MD  Active   ferrous sulfate  325 (65 FE) MG tablet [id number] Yes Take 1 tablet (325 mg total) by mouth every other day. Delores Suzann HERO, MD  Active   hydrocortisone  2.5 % ointment [id number] Yes Apply 1 Application topically as needed (skin irritation). [provider]  Active Self, Pharmacy Records  insulin  glargine (LANTUS  SOLOSTAR) 100 UNIT/ML Solostar Pen [id number] Yes Inject 20 Units into the skin daily.  Delores Suzann HERO, MD  Active   JARDIANCE  25 MG TABS tablet [id number] Yes TAKE 1 TABLET BY MOUTH DAILY Delores Suzann HERO, MD  Active   ketoconazole  (NIZORAL ) 2 % cream [id number] Yes Apply 1 Application topically daily. BETWEEN TOES for 1 week Delores Suzann HERO, MD  Active Self, Pharmacy Records  lisinopril  (ZESTRIL ) 2.5 MG tablet [id number] Yes Take 2.5 mg by mouth daily. [provider]  Active Self, Pharmacy Records  melatonin 5 MG TABS [id number] Yes Take 10 mg by mouth at bedtime. [provider]  Active Self, Pharmacy Records           Med Note DELETA DEBBY JONELLE Pablo May 10, 2021 11:20 AM)    metFORMIN  (GLUCOPHAGE ) 1000 MG tablet [id number] Yes TAKE 1 TABLET BY MOUTH TWICE A DAY WITH A MEAL Delores Suzann HERO, MD  Active Self, Pharmacy Records  Misc. Devices (PULSE OXIMETER) MISC [id number]  Please give finger oximeter Mannam, Praveen, MD  Active   mometasone -formoterol  (DULERA ) 200-5 MCG/ACT AERO [id number] Yes INHALE 2 PUFFS INTO THE LUNGS EVERY MORNING AND AT BEDTIME Delores Suzann HERO, MD  Active   Multiple Vitamin (MULTIVITAMIN) tablet [id number] Yes Take 1 tablet by mouth daily. Centrum Men 50+ [provider]  Active Self, Pharmacy Records  nitroGLYCERIN  (NITROSTAT ) 0.4 MG SL tablet [id number]  DISSOLVE 1 TABLET UNDER THE TONGUE FOR CHEST PAIN. IF PAIN REMAINS AFTER 5 MINUTES, CALL 911 AND REPEAT DOSE. MAX 3 TABLETS IN 15 MINUTES  Patient not taking: Reported on 09/19/2024   Jeanelle Layman CROME, MD  Active Self, Pharmacy Records           Med Note LEOBARDO, NICOLE   Thu May 16, 2024  8:46 AM) For cardiac emergencies.    nystatin  cream (MYCOSTATIN ) [id number] Yes Apply 1 Application topically 2 (two) times daily as needed for dry skin. [provider]  Active Self, Pharmacy Records           Med Note (LEE, NICOLE   Thu May 16, 2024  9:02 AM) No dispense records to support medication  omeprazole  (PRILOSEC) 20 MG capsule [id number] Yes Take 1 capsule (20 mg total) by mouth 2  (two) times daily before a meal. Mannam, Praveen, MD  Active Self, Pharmacy Records  prasugrel  (EFFIENT ) 10 MG TABS tablet [id number] Yes TAKE 1 TABLET BY MOUTH DAILY Jordan, Cobi Aldape M, MD  Active   Semaglutide , 2 MG/DOSE, (OZEMPIC , 2 MG/DOSE,) 8 MG/3ML SOPN [id number] Yes Inject 2 mg into the skin once a week. Monday Delores Suzann HERO, MD  Active Self, Pharmacy Records  tiotropium (SPIRIVA  HANDIHALER) 18 MCG inhalation capsule [id number] Yes Place 1 capsule (18 mcg total) into inhaler and inhale daily. Delores Suzann  M, MD  Active   triamcinolone  ointment (KENALOG ) 0.1 % [id number] Yes Apply 1 Application topically 2 (two) times daily. Delores Suzann HERO, MD  Active Self, Pharmacy Records  VENTOLIN  HFA 108 [phone]) MCG/ACT inhaler [id number] Yes INHALE 2 PUFFS INTO THE LUNGS EVERY 6 HOURS AS NEEDED FOR WHEEZING OR FOR SHORTNESS OF BREATH Delores Suzann HERO, MD  Active   zinc  oxide 20 % ointment [id number] Yes Apply 1 Application topically as needed for irritation. Delores Suzann HERO, MD  Active Self, Pharmacy Records           Med Note LEOBARDO, NICOLE   Thu May 16, 2024  8:58 AM) More than 30 days ago  Med List Note Leobardo Garre, CPhT 05/16/24 9157): Patient uses a CPAP.           Patient is participating in a Managed Medicaid Plan:  Yes   A/P: Diabetes longstanding currently uncontrolled. Patient is able to verbalize appropriate hypoglycemia management plan. Medication adherence appears good.  - Exchanged Libre 2 to [place] 3 sensor during visit today.  - Continued basal insulin  Lantus  (insulin  glargine) 20 units daily.  - Continued GLP-1 Ozempic  (semaglutide ) 2mg  once weekly.   - Continued SGLT2-I Jardiance  (empagliflozin ) 25 mg. Counseled on sick day rules. - Continued metformin  1000 mg BID.  - Patient educated on purpose, proper use, and potential adverse effects.  - Extensively discussed pathophysiology of diabetes, recommended lifestyle interventions, dietary effects on glucose control.  - Counseled on s/sx  of and management of hypoglycemia.   Written patient instructions provided. Patient verbalized understanding of treatment plan.  Total time in face to face counseling 22 minutes.    Follow-up:  Pharmacist visit 10/28/2024 PCP clinic visit TBD Patient seen with Sabra Schuller, PharmD Candidate - PY2 student and Megan McGill, PharmD Candidate - PY4 student.    "

## 2024-09-19 NOTE — Telephone Encounter (Signed)
 Reviewed and agree with Dr Rennis plan.

## 2024-09-19 NOTE — Patient Instructions (Signed)
 It was nice to see you today!  Your goal glucose value is 80-130 before eating and less than 180 after eating.  Medication Changes: Continue all medication the same.  Keep up the good work with diet and exercise. Aim for a diet full of vegetables, fruit and lean meats (chicken, turkey, fish). Try to limit salt intake by eating fresh or frozen vegetables (instead of canned), rinse canned vegetables prior to cooking and do not add any additional salt to meals.   Please bring all medications to your clinic visits.  Please arrive 10-15 minutes prior to your scheduled visit time.

## 2024-09-20 NOTE — Progress Notes (Signed)
 Reviewed and agree with Dr Rennis plan.

## 2024-09-23 ENCOUNTER — Ambulatory Visit: Admitting: Podiatry

## 2024-09-26 ENCOUNTER — Ambulatory Visit: Admitting: Podiatry

## 2024-09-26 ENCOUNTER — Encounter: Payer: Self-pay | Admitting: Podiatry

## 2024-09-26 DIAGNOSIS — B351 Tinea unguium: Secondary | ICD-10-CM | POA: Diagnosis not present

## 2024-09-26 DIAGNOSIS — M79675 Pain in left toe(s): Secondary | ICD-10-CM | POA: Diagnosis not present

## 2024-09-26 DIAGNOSIS — M79674 Pain in right toe(s): Secondary | ICD-10-CM

## 2024-09-26 DIAGNOSIS — E1142 Type 2 diabetes mellitus with diabetic polyneuropathy: Secondary | ICD-10-CM | POA: Diagnosis not present

## 2024-09-26 NOTE — Progress Notes (Signed)
 This patient returns to my office for at risk foot care.  This patient requires this care by a professional since this patient will be at risk due to having diabetes.  This patient is unable to cut nails himself since the patient cannot reach his nails.These nails are painful walking and wearing shoes.  This patient presents for at risk foot care today.  General Appearance  Alert, conversant and in no acute stress.  Vascular  Dorsalis pedis and posterior tibial  pulses are palpable  bilaterally.  Capillary return is within normal limits  bilaterally. Temperature is within normal limits  bilaterally.  Neurologic  Senn-Weinstein monofilament wire test within normal limits  bilaterally. Muscle power within normal limits bilaterally.  Nails Thick disfigured discolored nails with subungual debris  from hallux to fifth toes bilaterally. No evidence of bacterial infection or drainage bilaterally.  Orthopedic  No limitations of motion  feet .  No crepitus or effusions noted.  No bony pathology or digital deformities noted.  Skin  normotropic skin with no porokeratosis noted bilaterally.  No signs of infections or ulcers noted.     Onychomycosis  Pain in right toes  Pain in left toes  Consent was obtained for treatment procedures.   Mechanical debridement of nails 1-5  bilaterally performed with a nail nipper.  Filed with dremel without incident.    Return office visit    3 months /tbn                 Told patient to return for periodic foot care and evaluation due to potential at risk complications.   Cordella Bold DPM

## 2024-09-30 ENCOUNTER — Other Ambulatory Visit: Payer: Self-pay | Admitting: Otolaryngology

## 2024-10-02 ENCOUNTER — Encounter: Payer: Self-pay | Admitting: Gastroenterology

## 2024-10-02 ENCOUNTER — Other Ambulatory Visit (HOSPITAL_COMMUNITY): Payer: Self-pay

## 2024-10-02 ENCOUNTER — Telehealth: Payer: Self-pay | Admitting: Pharmacist

## 2024-10-02 ENCOUNTER — Other Ambulatory Visit

## 2024-10-02 ENCOUNTER — Telehealth: Payer: Self-pay

## 2024-10-02 ENCOUNTER — Ambulatory Visit: Admitting: Gastroenterology

## 2024-10-02 VITALS — BP 130/80 | HR 72 | Ht 67.0 in | Wt 276.0 lb

## 2024-10-02 DIAGNOSIS — E119 Type 2 diabetes mellitus without complications: Secondary | ICD-10-CM

## 2024-10-02 DIAGNOSIS — D649 Anemia, unspecified: Secondary | ICD-10-CM

## 2024-10-02 DIAGNOSIS — Z860100 Personal history of colon polyps, unspecified: Secondary | ICD-10-CM

## 2024-10-02 DIAGNOSIS — Z7902 Long term (current) use of antithrombotics/antiplatelets: Secondary | ICD-10-CM

## 2024-10-02 DIAGNOSIS — I2510 Atherosclerotic heart disease of native coronary artery without angina pectoris: Secondary | ICD-10-CM | POA: Diagnosis not present

## 2024-10-02 DIAGNOSIS — G4733 Obstructive sleep apnea (adult) (pediatric): Secondary | ICD-10-CM | POA: Diagnosis not present

## 2024-10-02 DIAGNOSIS — D509 Iron deficiency anemia, unspecified: Secondary | ICD-10-CM

## 2024-10-02 LAB — COMPREHENSIVE METABOLIC PANEL WITH GFR
ALT: 14 U/L (ref 3–53)
AST: 15 U/L (ref 5–37)
Albumin: 4.1 g/dL (ref 3.5–5.2)
Alkaline Phosphatase: 96 U/L (ref 39–117)
BUN: 13 mg/dL (ref 6–23)
CO2: 28 meq/L (ref 19–32)
Calcium: 9.5 mg/dL (ref 8.4–10.5)
Chloride: 101 meq/L (ref 96–112)
Creatinine, Ser: 0.64 mg/dL (ref 0.40–1.50)
GFR: 100.91 mL/min
Glucose, Bld: 204 mg/dL — ABNORMAL HIGH (ref 70–99)
Potassium: 4 meq/L (ref 3.5–5.1)
Sodium: 137 meq/L (ref 135–145)
Total Bilirubin: 0.3 mg/dL (ref 0.2–1.2)
Total Protein: 7.2 g/dL (ref 6.0–8.3)

## 2024-10-02 LAB — CBC WITH DIFFERENTIAL/PLATELET
Basophils Absolute: 0 K/uL (ref 0.0–0.1)
Basophils Relative: 0.6 % (ref 0.0–3.0)
Eosinophils Absolute: 0.2 K/uL (ref 0.0–0.7)
Eosinophils Relative: 2.6 % (ref 0.0–5.0)
HCT: 43.9 % (ref 39.0–52.0)
Hemoglobin: 14.8 g/dL (ref 13.0–17.0)
Lymphocytes Relative: 25.5 % (ref 12.0–46.0)
Lymphs Abs: 2.1 K/uL (ref 0.7–4.0)
MCHC: 33.6 g/dL (ref 30.0–36.0)
MCV: 86 fl (ref 78.0–100.0)
Monocytes Absolute: 0.7 K/uL (ref 0.1–1.0)
Monocytes Relative: 8.3 % (ref 3.0–12.0)
Neutro Abs: 5.2 K/uL (ref 1.4–7.7)
Neutrophils Relative %: 63 % (ref 43.0–77.0)
Platelets: 236 K/uL (ref 150.0–400.0)
RBC: 5.11 Mil/uL (ref 4.22–5.81)
RDW: 16.6 % — ABNORMAL HIGH (ref 11.5–15.5)
WBC: 8.3 K/uL (ref 4.0–10.5)

## 2024-10-02 LAB — FOLATE: Folate: 23.2 ng/mL

## 2024-10-02 LAB — VITAMIN B12: Vitamin B-12: 213 pg/mL (ref 211–911)

## 2024-10-02 NOTE — Telephone Encounter (Signed)
 Reviewed and agree with Dr Rennis plan.

## 2024-10-02 NOTE — Progress Notes (Addendum)
 "  Chief Complaint: anemia Primary GI MD: Dr. Leigh  HPI: Discussed the use of AI scribe software for clinical note transcription with the patient, who gave verbal consent to proceed.  History of Present Illness   [name] [name] is a 64 year old male with anemia who presents for evaluation of anemia.  Anemia had previously improved as of October 2025, with no laboratory studies performed since that time. He is currently asymptomatic and denies gastrointestinal complaints including heartburn, reflux, or abdominal pain. No history of upper endoscopy. Recent colonoscopy was performed.  He occasionally observes red blood in his stool, attributed to known hemorrhoids. Denies melena. No current gastrointestinal problems reported.  COPD with multiple hospitalizations last year, most recently in September 2025 for right ear surgery. Since starting a new inhaler, he reports improved breathing and no current shortness of breath or chest pain.  Cardiac history includes regular follow-up with cardiology every six months and use of Effient .   PREVIOUS GI WORKUP    Colonoscopy 02/2024 - Perianal rash found on perianal exam.  - One 3 mm polyp in the cecum, removed with a cold snare. Resected and retrieved.  - Diverticulosis in the right colon.  - One 4 mm polyp in the transverse colon, removed with a cold snare. Resected and retrieved.  - One 3 mm polyp at the splenic flexure, removed with a cold snare. Resected and retrieved.  - Internal hemorrhoids.  - Colonic spasm.  - Colonic looping.  - The examination was otherwise normal.  FINAL DIAGNOSIS        1. Surgical [P], colon, splenic flexure, polyp (1) :       TUBULAR ADENOMA       NEGATIVE FOR HIGH-GRADE DYSPLASIA AND CARCINOMA        2. Surgical [P], colon, transverse and cecal, polyp (2) :       TUBULAR ADENOMA       SESSILE SERRATED POLYP WITHOUT CYTOLOGIC DYSPLASIA       NEGATIVE FOR HIGH-GRADE DYSPLASIA AND CARCINOMA    Colonoscopy 01/29/20 - The perianal and digital rectal examinations were normal. - Two sessile polyps were found in the cecum. The polyps were 3 mm in size. These polyps were removed with a cold snare. Resection and retrieval were complete. - A 3 mm polyp was found in the ascending colon. The polyp was sessile. The polyp was removed with a cold snare. Resection and retrieval were complete. - Two sessile polyps were found in the hepatic flexure. The polyps were 2 to 3 mm in size. These polyps were removed with a cold snare. Resection and retrieval were complete. - Three sessile polyps were found in the transverse colon. The polyps were 3 to 5 mm in size. These polyps were removed with a cold snare. Resection and retrieval were complete. - Two sessile polyps were found in the descending colon. The polyps were 2 to 3 mm in size. These polyps were removed with a cold snare. Resection and retrieval were complete. - A 3 mm polyp was found in the rectum. The polyp was sessile. The polyp was removed with a cold snare. Resection and retrieval were complete. - The exam was otherwise without abnormality.   Surgical [P], colon, cecum, ascending, hepatic flexure, transverse, descending, rectosigmoid, polyp (10) - MULTIPLE FRAGMENTS OF TUBULAR ADENOMA. - NO HIGH GRADE DYSPLASIA OR MALIGNANCY       Colonoscopy 02/25/21: - The perianal and digital rectal examinations were normal other than some perianal irritation. - A  diminutive polyp was found in the transverse colon. The polyp was sessile. The polyp was removed with a cold snare. Resection and retrieval were complete. - A 3 to 4 mm polyp was found in the sigmoid colon. The polyp was sessile. The polyp was removed with a cold snare. Resection and retrieval were complete. - Internal hemorrhoids were found during retroflexion. - The exam was otherwise without abnormality.   Surgical [P], colon, transverse, sigmoid, polyp (2) - TUBULAR ADENOMA (X2). -  NEGATIVE FOR HIGH GRADE DYSPLASIA.       Echo 05/10/21 IMPRESSIONS     1. Left ventricular ejection fraction, by estimation, is 60 to 65%. The  left ventricle has normal function. The left ventricle has no regional  wall motion abnormalities. There is mild left ventricular hypertrophy.  Left ventricular diastolic parameters  are consistent with Grade I diastolic dysfunction (impaired relaxation).   2. Right ventricular systolic function is normal. The right ventricular  size is normal. Tricuspid regurgitation signal is inadequate for assessing  PA pressure.   3. The mitral valve is normal in structure. No evidence of mitral valve  regurgitation. No evidence of mitral stenosis.   4. The aortic valve is tricuspid. Aortic valve regurgitation is not  visualized. Mild aortic valve sclerosis is present, with no evidence of  aortic valve stenosis.   5. The inferior vena cava is normal in size with <50% respiratory  variability, suggesting right atrial pressure of 8 mmHg.       Past Medical History:  Diagnosis Date   Alcoholism (HCC)    Anxiety    Arthritis    knees   Asthma    CAD S/P percutaneous coronary angioplasty 01/18/12; 10/2013   a. pRCA 3.5 x 18 vision BMS - 4.2 mm; b. 2/'15: mCx 3.5 x 12  Rebel BMS (3.6-3.7 mm)   Chronic back pain    mid/lower (08/01/2014)   Colon polyps 02/18/2020   May 2021 - 11 polyps that I removed during your recent procedure were proven to be adenomatous. Fortunately all of these polyps were extremely small in size.  These are considered to be pre-cancerous polyps that may have grown into cancers if they had not been removed.  Studies show that at least 20% of women over age 41 and 30% of men over age 23 have pre-cancerous polyps. Based on current [place]   COPD (chronic obstructive pulmonary disease) (HCC)    I'm seeing COPD dr now; don't know if I've got it (08/01/2014)   Depression    Dyspnea    GERD (gastroesophageal reflux disease)     Hyperlipidemia    Hypertension    Migraines    once in awhile (08/01/2014); no current problems as of 09/18/23 per pt.   Non-ST elevation myocardial infarction (NSTEMI) (HCC) 10/2013   OSA on CPAP 01/19/2012   Pneumonia    Sleep apnea    uses CPAP nightly   Type II diabetes mellitus [facility])     Past Surgical History:  Procedure Laterality Date   CARDIAC CATHETERIZATION  07/2014   Left Main: Short, large-caliber vessel. Widely patent. Bifurcates into the LAD and Circumflex. Angiographically normal.   CORONARY ANGIOPLASTY WITH STENT PLACEMENT  01/2012; 11/07/2013; 08/01/2014   1 + 2 + 1    CORONARY ANGIOPLASTY WITH STENT PLACEMENT  07/2014   Severe single-vessel disease involving the bifurcation of OM1 and OM 2 with 99% in-stent restenosis in the bare-metal stent placed in the OM 2.   LEFT HEART CATHETERIZATION WITH  CORONARY ANGIOGRAM N/A 01/20/2012   Procedure: LEFT HEART CATHETERIZATION WITH CORONARY ANGIOGRAM;  Surgeon: Candyce GORMAN Reek, MD;  Location: [facility] CATH LAB;  Service: Cardiovascular;  Laterality: N/A;   LEFT HEART CATHETERIZATION WITH CORONARY ANGIOGRAM N/A 11/07/2013   Procedure: LEFT HEART CATHETERIZATION WITH CORONARY ANGIOGRAM;  Surgeon: Candyce GORMAN Reek, MD;  Location: [facility] CATH LAB;  Service: Cardiovascular;  Laterality: N/A;   LEFT HEART CATHETERIZATION WITH CORONARY ANGIOGRAM N/A 08/01/2014   Procedure: LEFT HEART CATHETERIZATION WITH CORONARY ANGIOGRAM;  Surgeon: Alm LELON Clay, MD;  Location: [facility] CATH LAB;  Service: Cardiovascular;  Laterality: N/A;   MULTIPLE TOOTH EXTRACTIONS     PERCUTANEOUS CORONARY STENT INTERVENTION (PCI-S)  01/20/2012   Procedure: PERCUTANEOUS CORONARY STENT INTERVENTION (PCI-S);  Surgeon: Candyce GORMAN Reek, MD;  Location: [facility] CATH LAB;  Service: Cardiovascular;;   PERCUTANEOUS CORONARY STENT INTERVENTION (PCI-S)  11/07/2013   Procedure: PERCUTANEOUS CORONARY STENT INTERVENTION (PCI-S);  Surgeon: Candyce GORMAN Reek, MD;  Location: [facility] CATH LAB;   Service: Cardiovascular;;   TYMPANOMASTOIDECTOMY Left 11/16/2023   Procedure: LEFT OPEN CAVITY TYMPANOMASTOIDECTOMY;  Surgeon: Carlie Clark, MD;  Location: [facility] OR;  Service: ENT;  Laterality: Left;    Current Outpatient Medications  Medication Sig Dispense Refill   aspirin  81 MG chewable tablet Chew 1 tablet (81 mg total) by mouth daily. 180 tablet 2   atorvastatin  (LIPITOR ) 80 MG tablet TAKE 1 TABLET BY MOUTH DAILY 90 tablet 3   carvedilol  (COREG ) 3.125 MG tablet TAKE 1 TABLET BY MOUTH TWICE A DAY WITH A MEAL 180 tablet 3   citalopram  (CELEXA ) 20 MG tablet Take 1 tablet (20 mg total) by mouth daily. 90 tablet 1   Continuous Glucose Sensor (FREESTYLE LIBRE 3 PLUS SENSOR) MISC Change sensor every 15 days. 2 each 11   ferrous sulfate  325 (65 FE) MG tablet Take 1 tablet (325 mg total) by mouth every other day. 45 tablet 0   hydrocortisone  2.5 % ointment Apply 1 Application topically as needed (skin irritation).     insulin  glargine (LANTUS  SOLOSTAR) 100 UNIT/ML Solostar Pen Inject 20 Units into the skin daily. 15 mL 3   JARDIANCE  25 MG TABS tablet TAKE 1 TABLET BY MOUTH DAILY 90 tablet 3   ketoconazole  (NIZORAL ) 2 % cream Apply 1 Application topically daily. BETWEEN TOES for 1 week 15 g 0   lisinopril  (ZESTRIL ) 2.5 MG tablet Take 2.5 mg by mouth daily.     melatonin 5 MG TABS Take 10 mg by mouth at bedtime.     metFORMIN  (GLUCOPHAGE ) 1000 MG tablet TAKE 1 TABLET BY MOUTH TWICE A DAY WITH A MEAL 180 tablet 3   Misc. Devices (PULSE OXIMETER) MISC Please give finger oximeter 1 each 0   mometasone -formoterol  (DULERA ) 200-5 MCG/ACT AERO INHALE 2 PUFFS INTO THE LUNGS EVERY MORNING AND AT BEDTIME 39 g 3   Multiple Vitamin (MULTIVITAMIN) tablet Take 1 tablet by mouth daily. Centrum Men 50+     nitroGLYCERIN  (NITROSTAT ) 0.4 MG SL tablet DISSOLVE 1 TABLET UNDER THE TONGUE FOR CHEST PAIN. IF PAIN REMAINS AFTER 5 MINUTES, CALL 911 AND REPEAT DOSE. MAX 3 TABLETS IN 15 MINUTES 100 tablet 0   nystatin  cream  (MYCOSTATIN ) Apply 1 Application topically 2 (two) times daily as needed for dry skin.     omeprazole  (PRILOSEC) 20 MG capsule Take 1 capsule (20 mg total) by mouth 2 (two) times daily before a meal. 180 capsule 3   prasugrel  (EFFIENT ) 10 MG TABS tablet TAKE 1 TABLET BY MOUTH DAILY 90  tablet 2   Semaglutide , 2 MG/DOSE, (OZEMPIC , 2 MG/DOSE,) 8 MG/3ML SOPN Inject 2 mg into the skin once a week. Monday 9 mL 2   tiotropium (SPIRIVA  HANDIHALER) 18 MCG inhalation capsule Place 1 capsule (18 mcg total) into inhaler and inhale daily. 30 capsule 12   triamcinolone  ointment (KENALOG ) 0.1 % Apply 1 Application topically 2 (two) times daily. 80 g 3   VENTOLIN  HFA 108 (90 Base) MCG/ACT inhaler INHALE 2 PUFFS INTO THE LUNGS EVERY 6 HOURS AS NEEDED FOR WHEEZING OR FOR SHORTNESS OF BREATH 18 g 3   zinc  oxide 20 % ointment Apply 1 Application topically as needed for irritation. 56.7 g 0   No current facility-administered medications for this visit.    Allergies as of 10/02/2024 - Review Complete 10/02/2024  Allergen Reaction Noted   Shellfish allergy Nausea And Vomiting 04/15/2013   Yellow dye #6 (sunset yellow) Other (See Comments) 08/10/2023    Family History  Problem Relation Age of Onset   Breast cancer Mother    Heart attack Father 42   Hypertension Sister    Hyperlipidemia Sister    Hyperlipidemia Sister    Hypertension Sister    Diabetes Sister    Heart attack Brother    Stroke Brother    Diabetes Brother    Colon cancer Neg Hx    Esophageal cancer Neg Hx    Rectal cancer Neg Hx    Stomach cancer Neg Hx     Social History   Socioeconomic History   Marital status: Single    Spouse name: Not on file   Number of children: 0   Years of education: 15   Highest education level: Not on file  Occupational History   Occupation: Unemployed    Comment: Used to work for [employer]   Occupation: Consulting Civil Engineer, Museum/gallery Curator  Tobacco Use   Smoking status: Former    Current  packs/day: 0.00    Average packs/day: 1 pack/day for 42.4 years (42.4 ttl pk-yrs)    Types: Cigarettes    Start date: 09/13/1975    Quit date: 01/25/2018    Years since quitting: 6.6   Smokeless tobacco: Never   Tobacco comments:    Bupropion  helping  Vaping Use   Vaping status: Never Used  Substance and Sexual Activity   Alcohol use: Not Currently    Comment: quit 3 yrs ago in 12/2020 per pt.   Drug use: No    Comment: hx Crack Cocaine ( 05/09/16)   Sexual activity: Not Currently  Other Topics Concern   Not on file  Social History Narrative   Single.  Lives with a roommate.  Ambulates independently.   Social Drivers of Health   Tobacco Use: Medium Risk (09/26/2024)   Patient History    Smoking Tobacco Use: Former    Smokeless Tobacco Use: Never    Passive Exposure: Not on file  Financial Resource Strain: Not on file  Food Insecurity: High Risk (08/22/2024)   Received from [facility]   Epic    Within the past 12 months, you worried that your food would run out before you got money to buy more: Often true    Within the past 12 months, the food you bought just didn't last and you didn't have money to get more. : Often true  Transportation Needs: No Transportation Needs (08/22/2024)   Received from [facility]    In the past 12 months, has lack of reliable transportation  kept you from medical appointments, meetings, work or from getting things needed for daily living? : No  Physical Activity: Not on file  Stress: Not on file  Social Connections: Not on file  Intimate Partner Violence: Not At Risk (05/16/2024)   Epic    Fear of Current or Ex-Partner: No    Emotionally Abused: No    Physically Abused: No    Sexually Abused: No  Depression (PHQ2-9): Low Risk (08/15/2024)   Depression (PHQ2-9)    PHQ-2 Score: 2  Alcohol Screen: Not on file  Housing: Low Risk (08/22/2024)   Received from [facility]   Epic    What is your living situation today?: Not on  file    Think about the place you live. Do you have problems with any of the following? Choose all that apply:: None/None on this list  Utilities: Low Risk (08/22/2024)   Received from [facility]   Utilities    In the past 12 months has the electric, gas, oil, or water company threatened to shut off services in your home? : No  Health Literacy: Inadequate Health Literacy (08/11/2023)   Received from [facility]   B1300 Health Literacy    Frequency of need for help with medical instructions: Often    Review of Systems:    Constitutional: No weight loss, fever, chills, weakness or fatigue HEENT: Eyes: No change in vision               Ears, Nose, Throat:  No change in hearing or congestion Skin: No rash or itching Cardiovascular: No chest pain, chest pressure or palpitations   Respiratory: No SOB or cough Gastrointestinal: See HPI and otherwise negative Genitourinary: No dysuria or change in urinary frequency Neurological: No headache, dizziness or syncope Musculoskeletal: No new muscle or joint pain Hematologic: No bleeding or bruising Psychiatric: No history of depression or anxiety    Physical Exam:  Vital signs: BP 130/80   Pulse 72   Ht 5' 7 (1.702 m)   Wt 276 lb (125.2 kg)   BMI 43.23 kg/m   Constitutional: NAD, alert and cooperative Head:  Normocephalic and atraumatic. Eyes:   PEERL, EOMI. No icterus. Conjunctiva pink. Respiratory: Respirations even and unlabored. Lungs clear to auscultation bilaterally.   No wheezes, crackles, or rhonchi.  Cardiovascular:  Regular rate and rhythm. No peripheral edema, cyanosis or pallor.  Gastrointestinal:  Soft, nondistended, nontender. No rebound or guarding. Normal bowel sounds. No appreciable masses or hepatomegaly. Rectal:  Declines Msk:  Symmetrical without gross deformities. Without edema, no deformity or joint abnormality.  Neurologic:  Alert and  oriented x4;  grossly normal neurologically.   Skin:   Dry and intact without significant lesions or rashes. Psychiatric: Oriented to person, place and time. Demonstrates good judgement and reason without abnormal affect or behaviors.  Physical Exam    RELEVANT LABS AND IMAGING: CBC    Component Value Date/Time   WBC 7.9 06/28/2024 1021   WBC 11.2 (H) 05/16/2024 0217   RBC 4.95 06/28/2024 1021   RBC 4.72 05/16/2024 0217   HGB 13.2 06/28/2024 1021   HCT 42.1 06/28/2024 1021   PLT 309 06/28/2024 1021   MCV 85 06/28/2024 1021   MCH 26.7 06/28/2024 1021   MCH 25.6 (L) 05/16/2024 0217   MCHC 31.4 (L) 06/28/2024 1021   MCHC 30.8 05/16/2024 0217   RDW 16.8 (H) 06/28/2024 1021   LYMPHSABS 4.0 05/16/2024 0217   MONOABS 0.8 05/16/2024 0217  EOSABS 0.4 05/16/2024 0217   BASOSABS 0.1 05/16/2024 0217    CMP     Component Value Date/Time   NA 135 05/16/2024 0217   NA 137 03/11/2024 0929   K 3.8 05/16/2024 0217   CL 101 05/16/2024 0217   CO2 23 05/16/2024 0217   GLUCOSE 244 (H) 05/16/2024 0217   BUN 15 05/16/2024 0217   BUN 14 03/11/2024 0929   CREATININE 0.73 05/16/2024 0217   CREATININE 0.71 03/29/2016 1227   CALCIUM  8.5 (L) 05/16/2024 0217   PROT 6.5 05/16/2024 0217   PROT 7.2 10/25/2022 0946   ALBUMIN 3.3 (L) 05/16/2024 0217   ALBUMIN 4.6 10/25/2022 0946   AST 20 05/16/2024 0217   ALT 16 05/16/2024 0217   ALKPHOS 89 05/16/2024 0217   BILITOT 0.7 05/16/2024 0217   BILITOT 0.4 10/25/2022 0946   GFRNONAA >60 05/16/2024 0217   GFRAA >60 01/29/2020 1900     Assessment/Plan:   IDA Hgb around 12 from 02/2024 to 05/2024 with resolution 06/2024 hgb 13.2, ferritin 14, no other iron studies. Colonoscopy 02/2024 unrevealing. No previous EGD.  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 Occasional mild scant hemorrhoidal bleeding. Declined hemoccult today. COPD, former smoker, history of OSA on CPAP can falsely elevate hgb. Obvious iron deficiency noted with low  ferritin. Patient would prefer to hold off on EGD if possible. We discussed and ultimately decided to repeat labs and if persistent IDA then we will pursue EGD and at the time test for H pylori/celiac - CBC, CMP, ferritin and iron studies -- B12/folate - if persistent iron deficiency, consider EGD -- recent colonoscopy around the time of his anemia, no reason to repeat at this time.  History of colon polyps Colonoscopy 02/2024 with 2 tubular adenomas and 1 SSP with recall 5 years - Repeat 02/2029  CAD On Effient   Diabetes  OSA on sleep apnea   Laurenashley Viar Mollie RIGGERS [facility] 10/02/2024, 3:40 PM  Cc: Delores Suzann HERO, MD "

## 2024-10-02 NOTE — Progress Notes (Signed)
 Agree with assessment with the following thoughts. With history of IDA on antiplatelet therapy, I do think EGD is warranted. However, I understand his comorbidities and he is higher than average risk for anesthesia, multiple hospitalizations for COPD. If he wants to recheck labs and see where things are trending for now that is okay but would have him closely follow up with us  in a few months to see how he is doing and if his respiratory status is improved to where he could tolerate an EGD. If he has worsening IDA in the interim, however, we should pursue EGD sooner if he is willing and COPD improves.

## 2024-10-02 NOTE — Telephone Encounter (Signed)
 Pharmacy Patient Advocate Encounter   Received notification from [facility] KEY that prior authorization for FreeStyle Libre 3 Plus Sensor  is required/requested.   Insurance verification completed.   The patient is insured through [facility] MEDICAID.   Per test claim: PA required; PA submitted to above mentioned insurance via Latent Key/confirmation #/EOC [id number]. Status is pending

## 2024-10-02 NOTE — Patient Instructions (Signed)
 Your provider has requested that you go to the basement level for lab work before leaving today. Press B on the elevator. The lab is located at the first door on the left as you exit the elevator.  Due to recent changes in healthcare laws, you may see the results of your imaging and laboratory studies on MyChart before your provider has had a chance to review them.  We understand that in some cases there may be results that are confusing or concerning to you. Not all laboratory results come back in the same time frame and the provider may be waiting for multiple results in order to interpret others.  Please give us  48 hours in order for your provider to thoroughly review all the results before contacting the office for clarification of your results.   I appreciate the opportunity to care for you. Nestor Blower, PA

## 2024-10-02 NOTE — Telephone Encounter (Signed)
 Patient calls 1/20 stating his pharmacy is unable to provide new sensors at this time.  Offered Libre 2 which patient was told we are NOT returning to (and he has discarded his old libre 2 reader).   Contacted our office again 1/21 AM and asked again for assistance.   Contacted pharmacy - PA is still not approved.  I shared that I would try to facilitate on our end.    Re-contacted patient - left voice mail - informing PA should be approved in the next 24 hours.  Asked him to call pharmacy again tomorrow, if not yet approved, he can contact me for a sample sensor to cover him during aproval time.

## 2024-10-03 ENCOUNTER — Ambulatory Visit: Payer: Self-pay | Admitting: Gastroenterology

## 2024-10-03 ENCOUNTER — Telehealth: Payer: Self-pay | Admitting: Pharmacist

## 2024-10-03 ENCOUNTER — Other Ambulatory Visit (HOSPITAL_COMMUNITY): Payer: Self-pay

## 2024-10-03 ENCOUNTER — Other Ambulatory Visit: Payer: Self-pay

## 2024-10-03 LAB — IRON,TIBC AND FERRITIN PANEL
%SAT: 24 % (ref 20–48)
Ferritin: 13 ng/mL — ABNORMAL LOW (ref 24–380)
Iron: 94 ug/dL (ref 50–180)
TIBC: 386 ug/dL (ref 250–425)

## 2024-10-03 MED ORDER — INSULIN PEN NEEDLE 31G X 4 MM MISC: 1.0000 | 11 refills | Status: AC | PRN

## 2024-10-03 NOTE — Telephone Encounter (Signed)
 Patient contacts office, reporting his CGM is approved and being prepared for pick-up today.  He thanked me for the assistance in getting his CGM switched from [place] 2 to [place] 3+  Additionally, he requested assistance with insulin  pen needle supply - new prescription.  Agreed and sent new prescription for pen needle tips.   Total time with patient call and documentation of interaction: 8 minutes.

## 2024-10-03 NOTE — Telephone Encounter (Signed)
 Reviewed and agree with Dr Rennis plan.

## 2024-10-03 NOTE — Telephone Encounter (Signed)
 Pharmacy Patient Advocate Encounter  Received notification from [facility] MEDICAID that Prior Authorization for FREESTYLE LIBRE 3 PLUS SENSORS has been APPROVED from 10/02/24 to 04/01/25   PA #/Case ID/Reference #: [id number]

## 2024-10-10 NOTE — Progress Notes (Signed)
 Surgical Instructions   Your procedure is scheduled on Wednesday October 16, 2024. Report to [facility] Main Entrance A at 9:00 A.M., then check in with the Admitting office. Any questions or running late day of surgery: call [phone]  Questions prior to your surgery date: call [phone], Monday-Friday, 8am-4pm. If you experience any cold or flu symptoms such as cough, fever, chills, shortness of breath, etc. between now and your scheduled surgery, please notify us  at the above number.     Remember:  Do not eat after midnight the night before your surgery   You may drink clear liquids until 8:00 the morning of your surgery.   Clear liquids allowed are: Water, Non-Citrus Juices (without pulp), Carbonated Beverages, Clear Tea (no milk, honey, etc.), Black Coffee Only (NO MILK, CREAM OR POWDERED CREAMER of any kind), and Gatorade.    Take these medicines the morning of surgery with A SIP OF WATER  carvedilol  (COREG )  citalopram  (CELEXA )  mometasone -formoterol  (DULERA )  omeprazole  (PRILOSEC)  tiotropium (SPIRIVA  HANDIHALER)   May take these medicines IF NEEDED: VENTOLIN  HFA 108 (90 Base) MCG/ACT inhaler  Please bring with you to the hospital   PER YOUR CARDIOLOGISTS INSTRUCTIONS PLEASE HOLD YOUR ASPIRIN  AND YOUR prasugrel  (EFFIENT ) 7 DAYS PRIOR TO SURGERY WITH THE LAST DOSE BEING 10/08/2024.    One week prior to surgery, STOP taking any Aleve, Naproxen, Ibuprofen, Motrin, Advil, Goody's, BC's, all herbal medications, fish oil , and non-prescription vitamins.  This includes your diclofenac Sodium (VOLTAREN ARTHRITIS PAIN) 1 % GEL.     WHAT DO I DO ABOUT MY DIABETES MEDICATION?   Do not take oral diabetes medicines (pills) the morning of surgery.        DO NOT TAKE YOUR JARDIANCE  3 DAYS PRIOR TO SURGERY WITH THE LAST DOSE BEING 10/12/2024.         DO NOT TAKE YOUR metFORMIN  (GLUCOPHAGE ) THE MORNING OF SURGERY.            DO NOT TAKE YOUR Semaglutide , 2 MG/DOSE, (OZEMPIC ,  2 MG/DOSE,)  7 DAYS BEFORE SURGERY WITH THE LAST DOSE BEING NO LATER THAN 10/08/2024.   TAKE 7.5 units OF YOUR insulin  glargine (LANTUS  SOLOSTAR), WHICH IS 50% OF YOUR REGULAR DOSE.    The day of surgery, do not take other diabetes injectables, including Byetta (exenatide), Bydureon (exenatide ER), Victoza  (liraglutide ), or Trulicity (dulaglutide).  If your CBG is greater than 220 mg/dL, you may take  of your sliding scale (correction) dose of insulin .   HOW TO MANAGE YOUR DIABETES BEFORE AND AFTER SURGERY  Why is it important to control my blood sugar before and after surgery? Improving blood sugar levels before and after surgery helps healing and can limit problems. A way of improving blood sugar control is eating a healthy diet by:  Eating less sugar and carbohydrates  Increasing activity/exercise  Talking with your doctor about reaching your blood sugar goals High blood sugars (greater than 180 mg/dL) can raise your risk of infections and slow your recovery, so you will need to focus on controlling your diabetes during the weeks before surgery. Make sure that the doctor who takes care of your diabetes knows about your planned surgery including the date and location.  How do I manage my blood sugar before surgery? Check your blood sugar at least 4 times a day, starting 2 days before surgery, to make sure that the level is not too high or low.  Check your blood sugar the morning of your surgery when you  wake up and every 2 hours until you get to the Short Stay unit.  If your blood sugar is less than 70 mg/dL, you will need to treat for low blood sugar: Do not take insulin . Treat a low blood sugar (less than 70 mg/dL) with  cup of clear juice (cranberry or apple), 4 glucose tablets, OR glucose gel. Recheck blood sugar in 15 minutes after treatment (to make sure it is greater than 70 mg/dL). If your blood sugar is not greater than 70 mg/dL on recheck, call [phone] for further  instructions. Report your blood sugar to the short stay nurse when you get to Short Stay.  If you are admitted to the hospital after surgery: Your blood sugar will be checked by the staff and you will probably be given insulin  after surgery (instead of oral diabetes medicines) to make sure you have good blood sugar levels. The goal for blood sugar control after surgery is 80-180 mg/dL.                        Do NOT Smoke (Tobacco/Vaping) for 24 hours prior to your procedure.  If you use a CPAP at night, you may bring your mask/headgear for your overnight stay.   You will be asked to remove any contacts, glasses, piercing's, hearing aid's, dentures/partials prior to surgery. Please bring cases for these items if needed.    Your surgeon will determine if you are to be admitted or discharged the same day.  Patients discharged the day of surgery will not be allowed to drive home, and someone needs to stay with them for 24 hours.  SURGICAL WAITING ROOM VISITATION Patients may have no more than 2 support people in the waiting area - these visitors may rotate.   Pre-op nurse will coordinate an appropriate time for 2 ADULT support persons, who may not rotate, to accompany patient in pre-op.  Children under the age of 61 must have an adult with them who is not the patient and must remain in the main waiting area with an adult.  If the patient needs to stay at the hospital during part of their recovery, the visitor guidelines for inpatient rooms apply.  Please refer to the [facility] website for the visitor guidelines for any additional information.   If you received a COVID test during your pre-op visit  it is requested that you wear a mask when out in public, stay away from anyone that may not be feeling well and notify your surgeon if you develop symptoms. If you have been in contact with anyone that has tested positive in the last 10 days please notify you surgeon.      Pre-operative CHG  Bathing Instructions   You can play a key role in reducing the risk of infection after surgery. Your skin needs to be as free of germs as possible. You can reduce the number of germs on your skin by washing with CHG (chlorhexidine  gluconate) soap before surgery. CHG is an antiseptic soap that kills germs and continues to kill germs even after washing.   DO NOT use if you have an allergy to chlorhexidine /CHG or antibacterial soaps. If your skin becomes reddened or irritated, stop using the CHG and notify one of our RNs at [phone].              TAKE A SHOWER THE NIGHT BEFORE SURGERY   Please keep in mind the following:  DO NOT shave, including legs  and underarms, 48 hours prior to surgery.   You may shave your face before/day of surgery.  Place clean sheets on your bed the night before surgery Use a clean washcloth (not used since being washed) for shower. DO NOT sleep with pet's night before surgery.  CHG Shower Instructions:  Wash your face and private area with normal soap. If you choose to wash your hair, wash first with your normal shampoo.  After you use shampoo/soap, rinse your hair and body thoroughly to remove shampoo/soap residue.  Turn the water OFF and apply half the bottle of CHG soap to a CLEAN washcloth.  Apply CHG soap ONLY FROM YOUR NECK DOWN TO YOUR TOES (washing for 3-5 minutes)  DO NOT use CHG soap on face, private areas, open wounds, or sores.  Pay special attention to the area where your surgery is being performed.  If you are having back surgery, having someone wash your back for you may be helpful. Wait 2 minutes after CHG soap is applied, then you may rinse off the CHG soap.  Pat dry with a clean towel  Put on clean pajamas    Additional instructions for the day of surgery: If you choose, you may shower the morning of surgery with an antibacterial soap.  DO NOT APPLY any lotions, deodorants or cologne.   Do not wear jewelry Do not bring valuables to the  hospital. [facility] is not responsible for valuables/personal belongings. Put on clean/comfortable clothes.  Please brush your teeth.  Ask your nurse before applying any prescription medications to the skin.

## 2024-10-11 ENCOUNTER — Other Ambulatory Visit: Payer: Self-pay

## 2024-10-11 ENCOUNTER — Encounter (HOSPITAL_COMMUNITY): Payer: Self-pay

## 2024-10-11 ENCOUNTER — Encounter (HOSPITAL_COMMUNITY)
Admission: RE | Admit: 2024-10-11 | Discharge: 2024-10-11 | Disposition: A | Source: Ambulatory Visit | Attending: Otolaryngology

## 2024-10-11 DIAGNOSIS — Z01812 Encounter for preprocedural laboratory examination: Secondary | ICD-10-CM | POA: Insufficient documentation

## 2024-10-11 DIAGNOSIS — I252 Old myocardial infarction: Secondary | ICD-10-CM | POA: Insufficient documentation

## 2024-10-11 DIAGNOSIS — Z955 Presence of coronary angioplasty implant and graft: Secondary | ICD-10-CM | POA: Diagnosis not present

## 2024-10-11 DIAGNOSIS — E785 Hyperlipidemia, unspecified: Secondary | ICD-10-CM | POA: Insufficient documentation

## 2024-10-11 DIAGNOSIS — J4489 Other specified chronic obstructive pulmonary disease: Secondary | ICD-10-CM | POA: Diagnosis not present

## 2024-10-11 DIAGNOSIS — K219 Gastro-esophageal reflux disease without esophagitis: Secondary | ICD-10-CM | POA: Diagnosis not present

## 2024-10-11 DIAGNOSIS — I10 Essential (primary) hypertension: Secondary | ICD-10-CM | POA: Diagnosis not present

## 2024-10-11 DIAGNOSIS — Z01818 Encounter for other preprocedural examination: Secondary | ICD-10-CM | POA: Diagnosis present

## 2024-10-11 DIAGNOSIS — I2510 Atherosclerotic heart disease of native coronary artery without angina pectoris: Secondary | ICD-10-CM | POA: Diagnosis not present

## 2024-10-11 DIAGNOSIS — Z87891 Personal history of nicotine dependence: Secondary | ICD-10-CM | POA: Diagnosis not present

## 2024-10-11 DIAGNOSIS — G4733 Obstructive sleep apnea (adult) (pediatric): Secondary | ICD-10-CM | POA: Diagnosis not present

## 2024-10-11 DIAGNOSIS — Z7982 Long term (current) use of aspirin: Secondary | ICD-10-CM | POA: Insufficient documentation

## 2024-10-11 DIAGNOSIS — E119 Type 2 diabetes mellitus without complications: Secondary | ICD-10-CM | POA: Diagnosis not present

## 2024-10-11 DIAGNOSIS — Z79899 Other long term (current) drug therapy: Secondary | ICD-10-CM | POA: Insufficient documentation

## 2024-10-11 LAB — GLUCOSE, CAPILLARY: Glucose-Capillary: 253 mg/dL — ABNORMAL HIGH (ref 70–99)

## 2024-10-11 NOTE — Progress Notes (Addendum)
 PCP - Dr. Suzann Daring, MD Cardiologist - Dr. Peter Jordan - LOV 05-09-24; clearance on 08-28-24.  PPM/ICD - denies Device Orders - n/a Rep Notified - n/a  Chest x-ray - n/a EKG - 05-16-24 Stress Test - 07-31-14 ECHO - 05-10-21 Cardiac Cath - 08-01-14 PCI 11-07-13  Sleep Study - yes, positive  CPAP - wears cpap nightly  Fasting Blood Sugar - 100-150 Patient wears a free style libre 3 on left upper arm  Last dose of GLP1 agonist-  Ozempic , last dose on 08-07-25 GLP1 instructions: hold for 7 days  Blood Thinner Instructions: Effiant - hold for 7 days; per patient last dose on 10-11-24. Aspirin  Instructions: hold for 7 days; per patient last dose was on 10-11-24.  ERAS Protcol - clears until 0800 PRE-SURGERY Ensure or G2- none  COVID TEST- n/a   Anesthesia review: yes, CAD, HTN, COPD, DM  Patient denies shortness of breath, fever, cough and chest pain at PAT appointment. Patient denies any respiratory issues at this time.    All instructions explained to the patient, with a verbal understanding of the material. Patient agrees to go over the instructions while at home for a better understanding. Patient also instructed to self quarantine after being tested for COVID-19. The opportunity to ask questions was provided.

## 2024-10-16 ENCOUNTER — Encounter (HOSPITAL_COMMUNITY): Payer: Self-pay | Admitting: Physician Assistant

## 2024-10-16 ENCOUNTER — Other Ambulatory Visit: Payer: Self-pay

## 2024-10-16 ENCOUNTER — Encounter (HOSPITAL_COMMUNITY): Admission: RE | Payer: Self-pay | Source: Home / Self Care

## 2024-10-16 ENCOUNTER — Encounter (HOSPITAL_COMMUNITY): Payer: Self-pay | Admitting: Anesthesiology

## 2024-10-16 ENCOUNTER — Observation Stay (HOSPITAL_COMMUNITY)
Admission: RE | Admit: 2024-10-16 | Discharge: 2024-10-17 | Disposition: A | Source: Home / Self Care | Attending: Otolaryngology | Admitting: Otolaryngology

## 2024-10-16 ENCOUNTER — Encounter (HOSPITAL_COMMUNITY): Payer: Self-pay | Admitting: Otolaryngology

## 2024-10-16 DIAGNOSIS — H7010 Chronic mastoiditis, unspecified ear: Principal | ICD-10-CM | POA: Diagnosis present

## 2024-10-16 LAB — GLUCOSE, CAPILLARY
Glucose-Capillary: 243 mg/dL — ABNORMAL HIGH (ref 70–99)
Glucose-Capillary: 213 mg/dL — ABNORMAL HIGH (ref 70–99)
Glucose-Capillary: 156 mg/dL — ABNORMAL HIGH (ref 70–99)
Glucose-Capillary: 174 mg/dL — ABNORMAL HIGH (ref 70–99)
Glucose-Capillary: 192 mg/dL — ABNORMAL HIGH (ref 70–99)

## 2024-10-16 MED ORDER — MORPHINE SULFATE (PF) 2 MG/ML IV SOLN
2.0000 mg | INTRAVENOUS | Status: DC | PRN
  Administered 2024-10-16 – 2024-10-17 (×2): 4 mg via INTRAVENOUS
  Filled 2024-10-16 (×2): qty 2

## 2024-10-16 MED ORDER — PROPOFOL 10 MG/ML IV BOLUS
INTRAVENOUS | Status: AC
  Filled 2024-10-16: qty 20

## 2024-10-16 MED ORDER — OXYCODONE HCL 5 MG/5ML PO SOLN: 5.0000 mg | Freq: Once | ORAL | Status: DC | PRN

## 2024-10-16 MED ORDER — EMPAGLIFLOZIN 25 MG PO TABS
25.0000 mg | ORAL_TABLET | Freq: Every day | ORAL | Status: DC
  Administered 2024-10-16 – 2024-10-17 (×2): 25 mg via ORAL
  Filled 2024-10-16 (×3): qty 1

## 2024-10-16 MED ORDER — CIPROFLOXACIN-DEXAMETHASONE 0.3-0.1 % OT SUSP
OTIC | Status: DC | PRN
  Administered 2024-10-16: 15 [drp] via OTIC

## 2024-10-16 MED ORDER — CIPROFLOXACIN-DEXAMETHASONE 0.3-0.1 % OT SUSP
OTIC | Status: AC
  Filled 2024-10-16: qty 7.5

## 2024-10-16 MED ORDER — INSULIN ASPART 100 UNIT/ML IJ SOLN
0.0000 [IU] | INTRAMUSCULAR | Status: DC | PRN
  Administered 2024-10-16: 3 [IU] via SUBCUTANEOUS

## 2024-10-16 MED ORDER — ASPIRIN 81 MG PO CHEW
81.0000 mg | CHEWABLE_TABLET | Freq: Every day | ORAL | Status: DC
  Administered 2024-10-16 – 2024-10-17 (×2): 81 mg via ORAL
  Filled 2024-10-16 (×2): qty 1

## 2024-10-16 MED ORDER — ATORVASTATIN CALCIUM 80 MG PO TABS
80.0000 mg | ORAL_TABLET | Freq: Every day | ORAL | Status: DC
  Administered 2024-10-16 – 2024-10-17 (×2): 80 mg via ORAL
  Filled 2024-10-16 (×2): qty 1

## 2024-10-16 MED ORDER — EPINEPHRINE HCL (NASAL) 0.1 % NA SOLN
NASAL | Status: AC
  Filled 2024-10-16: qty 30

## 2024-10-16 MED ORDER — ALBUMIN HUMAN 5 % IV SOLN: INTRAVENOUS | Status: DC | PRN

## 2024-10-16 MED ORDER — SODIUM CHLORIDE 0.9 % IR SOLN
Status: DC | PRN
  Administered 2024-10-16: 500 mL

## 2024-10-16 MED ORDER — PROPOFOL 10 MG/ML IV BOLUS
INTRAVENOUS | Status: DC | PRN
  Administered 2024-10-16: 150 mg via INTRAVENOUS
  Administered 2024-10-16: 50 mg via INTRAVENOUS

## 2024-10-16 MED ORDER — BACITRACIN ZINC 500 UNIT/GM EX OINT
TOPICAL_OINTMENT | CUTANEOUS | Status: DC | PRN
  Administered 2024-10-16: 1 via TOPICAL

## 2024-10-16 MED ORDER — NITROGLYCERIN 0.4 MG SL SUBL: 0.4000 mg | SUBLINGUAL_TABLET | SUBLINGUAL | Status: DC | PRN

## 2024-10-16 MED ORDER — METFORMIN HCL 500 MG PO TABS
1000.0000 mg | ORAL_TABLET | Freq: Two times a day (BID) | ORAL | Status: DC
  Administered 2024-10-17: 1000 mg via ORAL
  Filled 2024-10-16: qty 2

## 2024-10-16 MED ORDER — INSULIN GLARGINE-YFGN 100 UNIT/ML ~~LOC~~ SOLN
15.0000 [IU] | Freq: Every day | SUBCUTANEOUS | Status: DC
  Administered 2024-10-16 – 2024-10-17 (×2): 15 [IU] via SUBCUTANEOUS
  Filled 2024-10-16 (×2): qty 0.15

## 2024-10-16 MED ORDER — PROPOFOL 500 MG/50ML IV EMUL
INTRAVENOUS | Status: DC | PRN
  Administered 2024-10-16: 100 ug/kg/min via INTRAVENOUS

## 2024-10-16 MED ORDER — FLUTICASONE FUROATE-VILANTEROL 200-25 MCG/ACT IN AEPB
1.0000 | INHALATION_SPRAY | Freq: Every day | RESPIRATORY_TRACT | Status: DC
  Administered 2024-10-16: 1 via RESPIRATORY_TRACT
  Filled 2024-10-16: qty 28

## 2024-10-16 MED ORDER — FENTANYL CITRATE (PF) 100 MCG/2ML IJ SOLN
25.0000 ug | INTRAMUSCULAR | Status: DC | PRN
  Administered 2024-10-16: 50 ug via INTRAVENOUS

## 2024-10-16 MED ORDER — MIDAZOLAM HCL (PF) 2 MG/2ML IJ SOLN
INTRAMUSCULAR | Status: DC | PRN
  Administered 2024-10-16: 2 mg via INTRAVENOUS

## 2024-10-16 MED ORDER — ROCURONIUM BROMIDE 10 MG/ML (PF) SYRINGE
PREFILLED_SYRINGE | INTRAVENOUS | Status: DC | PRN
  Administered 2024-10-16: 10 mg via INTRAVENOUS

## 2024-10-16 MED ORDER — INSULIN ASPART 100 UNIT/ML IJ SOLN
INTRAMUSCULAR | Status: AC
  Filled 2024-10-16: qty 3

## 2024-10-16 MED ORDER — CEFAZOLIN SODIUM-DEXTROSE 3-4 GM/150ML-% IV SOLN
3.0000 g | INTRAVENOUS | Status: AC
  Administered 2024-10-16: 3 g via INTRAVENOUS
  Filled 2024-10-16: qty 150

## 2024-10-16 MED ORDER — LACTATED RINGERS IV SOLN: INTRAVENOUS | Status: DC | PRN

## 2024-10-16 MED ORDER — MELATONIN 5 MG PO TABS
10.0000 mg | ORAL_TABLET | Freq: Every day | ORAL | Status: DC
  Administered 2024-10-16: 10 mg via ORAL
  Filled 2024-10-16: qty 2

## 2024-10-16 MED ORDER — INSULIN ASPART 100 UNIT/ML IJ SOLN
INTRAMUSCULAR | Status: DC | PRN
  Administered 2024-10-16: 3 [IU] via INTRAVENOUS

## 2024-10-16 MED ORDER — INSULIN ASPART 100 UNIT/ML IJ SOLN: 0.0000 [IU] | Freq: Every day | INTRAMUSCULAR | Status: DC

## 2024-10-16 MED ORDER — LIDOCAINE 2% (20 MG/ML) 5 ML SYRINGE
INTRAMUSCULAR | Status: DC | PRN
  Administered 2024-10-16: 100 mg via INTRAVENOUS

## 2024-10-16 MED ORDER — LISINOPRIL 2.5 MG PO TABS
2.5000 mg | ORAL_TABLET | Freq: Every day | ORAL | Status: DC
  Administered 2024-10-16 – 2024-10-17 (×2): 2.5 mg via ORAL
  Filled 2024-10-16 (×3): qty 1

## 2024-10-16 MED ORDER — SODIUM CHLORIDE 0.9 % IV SOLN
0.1500 ug/kg/min | Freq: Once | INTRAVENOUS | Status: DC
  Filled 2024-10-16: qty 5000

## 2024-10-16 MED ORDER — MIDAZOLAM HCL 2 MG/2ML IJ SOLN
INTRAMUSCULAR | Status: AC
  Filled 2024-10-16: qty 2

## 2024-10-16 MED ORDER — HYDROCODONE-ACETAMINOPHEN 5-325 MG PO TABS
1.0000 | ORAL_TABLET | ORAL | Status: DC | PRN
  Administered 2024-10-17: 1 via ORAL
  Filled 2024-10-16: qty 1

## 2024-10-16 MED ORDER — CARVEDILOL 3.125 MG PO TABS
ORAL_TABLET | ORAL | Status: AC
  Administered 2024-10-16: 3.125 mg via ORAL
  Filled 2024-10-16: qty 1

## 2024-10-16 MED ORDER — SODIUM CHLORIDE 0.9 % IV SOLN: 0.1500 ug/kg/min | INTRAVENOUS | Status: DC

## 2024-10-16 MED ORDER — ORAL CARE MOUTH RINSE: 15.0000 mL | Freq: Once | OROMUCOSAL | Status: AC

## 2024-10-16 MED ORDER — SUCCINYLCHOLINE CHLORIDE 200 MG/10ML IV SOSY
PREFILLED_SYRINGE | INTRAVENOUS | Status: DC | PRN
  Administered 2024-10-16: 180 mg via INTRAVENOUS

## 2024-10-16 MED ORDER — 0.9 % SODIUM CHLORIDE (POUR BTL) OPTIME
TOPICAL | Status: DC | PRN
  Administered 2024-10-16: 1000 mL

## 2024-10-16 MED ORDER — LACTATED RINGERS IV SOLN: INTRAVENOUS | Status: DC

## 2024-10-16 MED ORDER — CITALOPRAM HYDROBROMIDE 20 MG PO TABS
20.0000 mg | ORAL_TABLET | Freq: Every day | ORAL | Status: DC
  Administered 2024-10-16 – 2024-10-17 (×2): 20 mg via ORAL
  Filled 2024-10-16 (×2): qty 1

## 2024-10-16 MED ORDER — UMECLIDINIUM BROMIDE 62.5 MCG/ACT IN AEPB
1.0000 | INHALATION_SPRAY | Freq: Every day | RESPIRATORY_TRACT | Status: DC
  Filled 2024-10-16: qty 7

## 2024-10-16 MED ORDER — EPINEPHRINE 0.1 % (1MG/ML) IJ FOR NASAL USE
INTRAMUSCULAR | Status: DC | PRN
  Administered 2024-10-16: 15 [drp] via TOPICAL

## 2024-10-16 MED ORDER — SODIUM CHLORIDE 0.9 % IV SOLN
0.1500 ug/kg/min | INTRAVENOUS | Status: DC
  Filled 2024-10-16 (×2): qty 5000

## 2024-10-16 MED ORDER — OXYCODONE HCL 5 MG PO TABS: 5.0000 mg | ORAL_TABLET | Freq: Once | ORAL | Status: DC | PRN

## 2024-10-16 MED ORDER — LIDOCAINE-EPINEPHRINE 1 %-1:100000 IJ SOLN
INTRAMUSCULAR | Status: AC
  Filled 2024-10-16: qty 1

## 2024-10-16 MED ORDER — ACETAMINOPHEN 500 MG PO TABS
1000.0000 mg | ORAL_TABLET | Freq: Once | ORAL | Status: AC
  Administered 2024-10-16: 1000 mg via ORAL
  Filled 2024-10-16: qty 2

## 2024-10-16 MED ORDER — ONDANSETRON HCL 4 MG/2ML IJ SOLN
INTRAMUSCULAR | Status: DC | PRN
  Administered 2024-10-16: 4 mg via INTRAVENOUS

## 2024-10-16 MED ORDER — FENTANYL CITRATE (PF) 250 MCG/5ML IJ SOLN
INTRAMUSCULAR | Status: DC | PRN
  Administered 2024-10-16: 50 ug via INTRAVENOUS

## 2024-10-16 MED ORDER — SODIUM CHLORIDE 0.9 % IV SOLN
0.1500 ug/kg/min | INTRAVENOUS | Status: AC
  Administered 2024-10-16: .1 ug/kg/min via INTRAVENOUS
  Filled 2024-10-16: qty 2000

## 2024-10-16 MED ORDER — FENTANYL CITRATE (PF) 100 MCG/2ML IJ SOLN
INTRAMUSCULAR | Status: AC
  Filled 2024-10-16: qty 2

## 2024-10-16 MED ORDER — EPHEDRINE SULFATE-NACL 50-0.9 MG/10ML-% IV SOSY
PREFILLED_SYRINGE | INTRAVENOUS | Status: DC | PRN
  Administered 2024-10-16 (×3): 5 mg via INTRAVENOUS

## 2024-10-16 MED ORDER — CARVEDILOL 6.25 MG PO TABS
3.1250 mg | ORAL_TABLET | Freq: Two times a day (BID) | ORAL | Status: DC
  Administered 2024-10-17: 3.125 mg via ORAL
  Filled 2024-10-16: qty 1

## 2024-10-16 MED ORDER — CHLORHEXIDINE GLUCONATE 0.12 % MT SOLN
15.0000 mL | Freq: Once | OROMUCOSAL | Status: AC
  Administered 2024-10-16: 15 mL via OROMUCOSAL
  Filled 2024-10-16: qty 15

## 2024-10-16 MED ORDER — INSULIN ASPART 100 UNIT/ML IJ SOLN
0.0000 [IU] | Freq: Three times a day (TID) | INTRAMUSCULAR | Status: DC
  Administered 2024-10-17: 11 [IU] via SUBCUTANEOUS
  Filled 2024-10-16: qty 5

## 2024-10-16 MED ORDER — AMISULPRIDE (ANTIEMETIC) 5 MG/2ML IV SOLN: 10.0000 mg | Freq: Once | INTRAVENOUS | Status: DC | PRN

## 2024-10-16 MED ORDER — FENTANYL CITRATE (PF) 250 MCG/5ML IJ SOLN
INTRAMUSCULAR | Status: AC
  Filled 2024-10-16: qty 5

## 2024-10-16 MED ORDER — PHENYLEPHRINE HCL-NACL 20-0.9 MG/250ML-% IV SOLN
INTRAVENOUS | Status: DC | PRN
  Administered 2024-10-16: 50 ug/min via INTRAVENOUS

## 2024-10-16 MED ORDER — ALBUTEROL SULFATE (2.5 MG/3ML) 0.083% IN NEBU
2.5000 mg | INHALATION_SOLUTION | Freq: Four times a day (QID) | RESPIRATORY_TRACT | Status: DC
  Administered 2024-10-16 – 2024-10-17 (×2): 2.5 mg via RESPIRATORY_TRACT
  Filled 2024-10-16 (×2): qty 3

## 2024-10-16 MED ORDER — PANTOPRAZOLE SODIUM 40 MG PO TBEC
40.0000 mg | DELAYED_RELEASE_TABLET | Freq: Every day | ORAL | Status: DC
  Administered 2024-10-16 – 2024-10-17 (×2): 40 mg via ORAL
  Filled 2024-10-16 (×2): qty 1

## 2024-10-16 MED ORDER — POTASSIUM CHLORIDE IN NACL 20-0.45 MEQ/L-% IV SOLN
INTRAVENOUS | Status: DC
  Filled 2024-10-16 (×2): qty 1000

## 2024-10-16 MED ORDER — CARVEDILOL 3.125 MG PO TABS: 3.1250 mg | ORAL_TABLET | Freq: Once | ORAL | Status: AC

## 2024-10-16 MED ORDER — BACITRACIN ZINC 500 UNIT/GM EX OINT
TOPICAL_OINTMENT | CUTANEOUS | Status: AC
  Filled 2024-10-16: qty 28.35

## 2024-10-16 MED ORDER — LIDOCAINE-EPINEPHRINE 1 %-1:100000 IJ SOLN
INTRAMUSCULAR | Status: DC | PRN
  Administered 2024-10-16: 6 mL

## 2024-10-16 MED ORDER — TIOTROPIUM BROMIDE MONOHYDRATE 18 MCG IN CAPS: 18.0000 ug | ORAL_CAPSULE | Freq: Every day | RESPIRATORY_TRACT | Status: DC

## 2024-10-16 NOTE — Telephone Encounter (Signed)
 Dr. Leigh,   This pt is have a  Mastoidectomy today (2/4) he is scheduled to have a EGD with you on 2/19. Is he ok to proceed as scheduled or does his procedure need to be pushed out?  Thnak you.

## 2024-10-16 NOTE — Plan of Care (Signed)
" °  Problem: Education: Goal: Knowledge of the prescribed therapeutic regimen will improve Outcome: Progressing   Problem: Bowel/Gastric: Goal: Gastrointestinal status for postoperative course will improve Outcome: Progressing   Problem: Cardiac: Goal: Ability to maintain an adequate cardiac output Outcome: Progressing Goal: Will show no evidence of cardiac arrhythmias Outcome: Progressing   Problem: Nutritional: Goal: Will attain and maintain optimal nutritional status Outcome: Progressing   Problem: Neurological: Goal: Will regain or maintain usual level of consciousness Outcome: Progressing   Problem: Clinical Measurements: Goal: Ability to maintain clinical measurements within normal limits Outcome: Progressing Goal: Postoperative complications will be avoided or minimized Outcome: Progressing   Problem: Respiratory: Goal: Will regain and/or maintain adequate ventilation Outcome: Progressing Goal: Respiratory status will improve Outcome: Progressing   Problem: Skin Integrity: Goal: Demonstrates signs of wound healing without infection Outcome: Progressing   Problem: Urinary Elimination: Goal: Will remain free from infection Outcome: Progressing Goal: Ability to achieve and maintain adequate urine output Outcome: Progressing   Problem: Education: Goal: Knowledge of General Education information will improve Description: Including pain rating scale, medication(s)/side effects and non-pharmacologic comfort measures Outcome: Progressing   Problem: Health Behavior/Discharge Planning: Goal: Ability to manage health-related needs will improve Outcome: Progressing   Problem: Clinical Measurements: Goal: Ability to maintain clinical measurements within normal limits will improve Outcome: Progressing Goal: Will remain free from infection Outcome: Progressing Goal: Diagnostic test results will improve Outcome: Progressing Goal: Respiratory complications will  improve Outcome: Progressing Goal: Cardiovascular complication will be avoided Outcome: Progressing   Problem: Activity: Goal: Risk for activity intolerance will decrease Outcome: Progressing   Problem: Nutrition: Goal: Adequate nutrition will be maintained Outcome: Progressing   Problem: Coping: Goal: Level of anxiety will decrease Outcome: Progressing   Problem: Elimination: Goal: Will not experience complications related to bowel motility Outcome: Progressing Goal: Will not experience complications related to urinary retention Outcome: Progressing   Problem: Pain Managment: Goal: General experience of comfort will improve and/or be controlled Outcome: Progressing   Problem: Safety: Goal: Ability to remain free from injury will improve Outcome: Progressing   Problem: Skin Integrity: Goal: Risk for impaired skin integrity will decrease Outcome: Progressing   Problem: Education: Goal: Ability to describe self-care measures that may prevent or decrease complications (Diabetes Survival Skills Education) will improve Outcome: Progressing Goal: Individualized Educational Video(s) Outcome: Progressing   Problem: Coping: Goal: Ability to adjust to condition or change in health will improve Outcome: Progressing   Problem: Fluid Volume: Goal: Ability to maintain a balanced intake and output will improve Outcome: Progressing   Problem: Health Behavior/Discharge Planning: Goal: Ability to identify and utilize available resources and services will improve Outcome: Progressing Goal: Ability to manage health-related needs will improve Outcome: Progressing   Problem: Metabolic: Goal: Ability to maintain appropriate glucose levels will improve Outcome: Progressing   Problem: Nutritional: Goal: Maintenance of adequate nutrition will improve Outcome: Progressing Goal: Progress toward achieving an optimal weight will improve Outcome: Progressing   Problem: Skin  Integrity: Goal: Risk for impaired skin integrity will decrease Outcome: Progressing   Problem: Tissue Perfusion: Goal: Adequacy of tissue perfusion will improve Outcome: Progressing   "

## 2024-10-16 NOTE — H&P (Signed)
 [name] [name] is an 64 y.o. male.   Chief Complaint: Chronic mastoiditis HPI: 64 year old male with long history of ear disease having undergone left open mastoidectomy in March 2025 with good results3.  With continued intermittent otorrhea and hearing loss in the right ear, he presents for surgical management.  Past Medical History:  Diagnosis Date   Alcoholism (HCC)    Anxiety    Arthritis    knees   Asthma    CAD S/P percutaneous coronary angioplasty 01/18/12; 10/2013   a. pRCA 3.5 x 18 vision BMS - 4.2 mm; b. 2/'15: mCx 3.5 x 12  Rebel BMS (3.6-3.7 mm)   Chronic back pain    mid/lower (08/01/2014)   Colon polyps 02/18/2020   May 2021 - 11 polyps that I removed during your recent procedure were proven to be adenomatous. Fortunately all of these polyps were extremely small in size.  These are considered to be pre-cancerous polyps that may have grown into cancers if they had not been removed.  Studies show that at least 20% of women over age 55 and 30% of men over age 59 have pre-cancerous polyps. Based on current nation   COPD (chronic obstructive pulmonary disease) (HCC)    I'm seeing COPD dr now; don't know if I've got it (08/01/2014)   Depression    Dyspnea    GERD (gastroesophageal reflux disease)    Hyperlipidemia    Hypertension    Migraines    once in awhile (08/01/2014); no current problems as of 09/18/23 per pt.   Non-ST elevation myocardial infarction (NSTEMI) (HCC) 10/2013   OSA on CPAP 01/19/2012   Pneumonia    Sleep apnea    uses CPAP nightly   Type II diabetes mellitus [facility])     Past Surgical History:  Procedure Laterality Date   CARDIAC CATHETERIZATION  07/2014   Left Main: Short, large-caliber vessel. Widely patent. Bifurcates into the LAD and Circumflex. Angiographically normal.   CORONARY ANGIOPLASTY WITH STENT PLACEMENT  01/2012; 11/07/2013; 08/01/2014   1 + 2 + 1    CORONARY ANGIOPLASTY WITH STENT PLACEMENT  07/2014   Severe single-vessel disease  involving the bifurcation of OM1 and OM 2 with 99% in-stent restenosis in the bare-metal stent placed in the OM 2.   LEFT HEART CATHETERIZATION WITH CORONARY ANGIOGRAM N/A 01/20/2012   Procedure: LEFT HEART CATHETERIZATION WITH CORONARY ANGIOGRAM;  Surgeon: Candyce GORMAN Reek, MD;  Location: [facility] CATH LAB;  Service: Cardiovascular;  Laterality: N/A;   LEFT HEART CATHETERIZATION WITH CORONARY ANGIOGRAM N/A 11/07/2013   Procedure: LEFT HEART CATHETERIZATION WITH CORONARY ANGIOGRAM;  Surgeon: Candyce GORMAN Reek, MD;  Location: [facility] CATH LAB;  Service: Cardiovascular;  Laterality: N/A;   LEFT HEART CATHETERIZATION WITH CORONARY ANGIOGRAM N/A 08/01/2014   Procedure: LEFT HEART CATHETERIZATION WITH CORONARY ANGIOGRAM;  Surgeon: Alm LELON Clay, MD;  Location: [facility] CATH LAB;  Service: Cardiovascular;  Laterality: N/A;   MULTIPLE TOOTH EXTRACTIONS     PERCUTANEOUS CORONARY STENT INTERVENTION (PCI-S)  01/20/2012   Procedure: PERCUTANEOUS CORONARY STENT INTERVENTION (PCI-S);  Surgeon: Candyce GORMAN Reek, MD;  Location: [facility] CATH LAB;  Service: Cardiovascular;;   PERCUTANEOUS CORONARY STENT INTERVENTION (PCI-S)  11/07/2013   Procedure: PERCUTANEOUS CORONARY STENT INTERVENTION (PCI-S);  Surgeon: Candyce GORMAN Reek, MD;  Location: [facility] CATH LAB;  Service: Cardiovascular;;   TYMPANOMASTOIDECTOMY Left 11/16/2023   Procedure: LEFT OPEN CAVITY TYMPANOMASTOIDECTOMY;  Surgeon: Carlie Clark, MD;  Location: [facility] OR;  Service: ENT;  Laterality: Left;    Family History  Problem Relation Age of Onset   Breast cancer Mother    Heart attack Father 62   Hypertension Sister    Hyperlipidemia Sister    Hyperlipidemia Sister    Hypertension Sister    Diabetes Sister    Heart attack Brother    Stroke Brother    Diabetes Brother    Colon cancer Neg Hx    Esophageal cancer Neg Hx    Rectal cancer Neg Hx    Stomach cancer Neg Hx    Social History:  reports that he quit smoking about 6 years ago. His smoking use included  cigarettes. He started smoking about 49 years ago. He has a 42.4 pack-year smoking history. He has never used smokeless tobacco. He reports that he does not currently use alcohol. He reports that he does not use drugs.  Allergies: Allergies[1]  Medications Prior to Admission  Medication Sig Dispense Refill   aspirin  81 MG chewable tablet Chew 1 tablet (81 mg total) by mouth daily. 180 tablet 2   atorvastatin  (LIPITOR ) 80 MG tablet TAKE 1 TABLET BY MOUTH DAILY (Patient taking differently: Take 80 mg by mouth at bedtime.) 90 tablet 3   carvedilol  (COREG ) 3.125 MG tablet TAKE 1 TABLET BY MOUTH TWICE A DAY WITH A MEAL 180 tablet 3   citalopram  (CELEXA ) 20 MG tablet Take 1 tablet (20 mg total) by mouth daily. 90 tablet 1   Continuous Glucose Sensor (FREESTYLE LIBRE 3 PLUS SENSOR) MISC Change sensor every 15 days. 2 each 11   ferrous sulfate  325 (65 FE) MG tablet Take 1 tablet (325 mg total) by mouth every other day. 45 tablet 0   insulin  glargine (LANTUS  SOLOSTAR) 100 UNIT/ML Solostar Pen Inject 20 Units into the skin daily. (Patient taking differently: Inject 15 Units into the skin daily.) 15 mL 3   JARDIANCE  25 MG TABS tablet TAKE 1 TABLET BY MOUTH DAILY 90 tablet 3   lisinopril  (ZESTRIL ) 2.5 MG tablet Take 2.5 mg by mouth daily.     melatonin 5 MG TABS Take 10 mg by mouth at bedtime.     metFORMIN  (GLUCOPHAGE ) 1000 MG tablet TAKE 1 TABLET BY MOUTH TWICE A DAY WITH A MEAL 180 tablet 3   mometasone -formoterol  (DULERA ) 200-5 MCG/ACT AERO INHALE 2 PUFFS INTO THE LUNGS EVERY MORNING AND AT BEDTIME 39 g 3   Multiple Vitamin (MULTIVITAMIN) tablet Take 1 tablet by mouth daily. Centrum Men 50+     omeprazole  (PRILOSEC) 20 MG capsule Take 1 capsule (20 mg total) by mouth 2 (two) times daily before a meal. 180 capsule 3   prasugrel  (EFFIENT ) 10 MG TABS tablet TAKE 1 TABLET BY MOUTH DAILY 90 tablet 2   Semaglutide , 2 MG/DOSE, (OZEMPIC , 2 MG/DOSE,) 8 MG/3ML SOPN Inject 2 mg into the skin once a week. Monday 9  mL 2   tiotropium (SPIRIVA  HANDIHALER) 18 MCG inhalation capsule Place 1 capsule (18 mcg total) into inhaler and inhale daily. 30 capsule 12   triamcinolone  ointment (KENALOG ) 0.1 % Apply 1 Application topically 2 (two) times daily. (Patient taking differently: Apply 1 Application topically daily as needed.) 80 g 3   diclofenac Sodium (VOLTAREN ARTHRITIS PAIN) 1 % GEL Apply 1 Application topically daily as needed (pain).     gentamicin  cream (GARAMYCIN ) 0.1 % Apply 1 Application topically daily as needed.     hydrocortisone  cream 1 % Apply 1 Application topically as needed (skin irritation).     Insulin  Pen Needle 31G X 4 MM MISC 1 Container  by Does not apply route as needed. 100 each 11   ketoconazole  (NIZORAL ) 2 % cream Apply 1 Application topically daily. BETWEEN TOES for 1 week 15 g 0   Misc. Devices (PULSE OXIMETER) MISC Please give finger oximeter 1 each 0   mupirocin  ointment (BACTROBAN ) 2 % Apply 1 Application topically daily as needed.     nitroGLYCERIN  (NITROSTAT ) 0.4 MG SL tablet DISSOLVE 1 TABLET UNDER THE TONGUE FOR CHEST PAIN. IF PAIN REMAINS AFTER 5 MINUTES, CALL 911 AND REPEAT DOSE. MAX 3 TABLETS IN 15 MINUTES (Patient not taking: Reported on 10/08/2024) 100 tablet 0   nystatin  cream (MYCOSTATIN ) Apply 1 Application topically 2 (two) times daily as needed for dry skin. (Patient not taking: Reported on 10/08/2024)     VENTOLIN  HFA 108 (90 Base) MCG/ACT inhaler INHALE 2 PUFFS INTO THE LUNGS EVERY 6 HOURS AS NEEDED FOR WHEEZING OR FOR SHORTNESS OF BREATH 18 g 3   Zinc  Oxide (DESITIN) 13 % CREA Apply 1 Application topically daily as needed.      Results for orders placed or performed during the hospital encounter of 10/16/24 (from the past 48 hours)  Glucose, capillary     Status: Abnormal   Collection Time: 10/16/24  9:01 AM  Result Value Ref Range   Glucose-Capillary 243 (H) 70 - 99 mg/dL    Comment: Glucose reference range applies only to samples taken after fasting for at least 8  hours.   Comment 1 Notify RN    *Note: Due to a large number of results and/or encounters for the requested time period, some results have not been displayed. A complete set of results can be found in Results Review.   No results found.  Review of Systems  All other systems reviewed and are negative.   Blood pressure (!) 146/79, pulse 61, temperature 97.7 F (36.5 C), resp. rate 20, height 5' 7 (1.702 m), weight 122.5 kg, SpO2 96%. Physical Exam Constitutional:      Appearance: Normal appearance. He is obese.  HENT:     Head: Normocephalic and atraumatic.     Right Ear: External ear normal.     Left Ear: External ear normal.     Nose: Nose normal.     Mouth/Throat:     Mouth: Mucous membranes are moist.     Pharynx: Oropharynx is clear.  Eyes:     Extraocular Movements: Extraocular movements intact.     Pupils: Pupils are equal, round, and reactive to light.  Cardiovascular:     Rate and Rhythm: Normal rate.  Pulmonary:     Effort: Pulmonary effort is normal.  Skin:    General: Skin is warm and dry.  Neurological:     General: No focal deficit present.     Mental Status: He is alert and oriented to person, place, and time.  Psychiatric:        Mood and Affect: Mood normal.        Behavior: Behavior normal.        Thought Content: Thought content normal.        Judgment: Judgment normal.      Assessment/Plan Chronic mastoiditis  To OR for right open cavity tympanomastoidectomy.  Plan overnight observation.  Vaughan Ricker, MD 10/16/2024, 10:14 AM       [1]  Allergies Allergen Reactions   Shellfish Allergy Nausea And Vomiting    OYSTERS   Yellow Dye #6 (Sunset Yellow) Other (See Comments)    Gi intolerance

## 2024-10-16 NOTE — Op Note (Signed)
 Preop diagnosis: Right chronic mastoiditis and otorrhea Postop diagnosis: same Procedure: Right open cavity tympanomastoidectomy Surgeon: Carlie Anesth: General endotracheal anesthesia local 1% lidocaine  with 1:100,000 epinephrine  Compl: None Findings: Right canal skin, TM, and middle ear tissues edematous with some polyps and mucoid middle ear and mastoid effusion.  Ossicles appeared articulated but the incus and head of malleus were removed.  The corda tympani nerve required sacrifice.  The mastoid was restricted.  The facial nerve was found to be dehiscent in the middle ear. Description:  After discussing risks, benefits, and alternatives, the patient was brought to the operative suite and placed on the operative table in the supine position.  Anesthesia was induced and the patient was intubated by the Anesthesia team without difficulty.  The eyes were taped closed and the bed was turned 90 degrees from Anesthesia.  The patient was given IV antibiotics during the case.  The nerve integrity monitor was placed for monitoring the right facial nerve.  Hair behind the ear was shaved back exposing the incision site.  The right ear was prepped and draped in sterile fashion.  The ear was inspected under the operating microscope using an ear speculum. The canal and post-auricular site were injected with local anesthetic.   Under the operating microscope, radial canal incisions were made at 12 o'clock and 6 o'clock and the posterior circumferential incision was made.  The tympanomeatal flap was elevated to the annulus.  The post-auricular incision was then made and deepened to the mastoid periosteum and temporalis fascia.  A T-shaped incision was made through the periosteum and it was elevated, exposing the mastoid cortex including down the canal.  The external ear was retracted anteriorly with a 1/4 inch Penrose drain and self-retaining retractors were added.  A drill with a 5 mm cutting burr was used to remove  the mastoid cortex and to skeletonize the sigmoid sinus and tegmen tympani.  The mastoid was small and not well-aerated.  Drilling included removing the posterior canal wall.  With further dissection, the mastoid antrum was approached and the incus identified.  Through the canal, the tympanomeatal flap was further elevated lifting the annulus out of its groove and exposing the edematous middle ear.  The posterior canal wall was then further drilled, opening the mastoid antrum and allowing full exposure of the incus.  The corda tympani nerve was identified but was sacrificed.  The incus was cleared of soft tissue and the incudostapedial joint divided. The incus was then removed and passed to nursing for pathology.  The head of the malleus was then exposed and was removed after dividing it from the long process.   At this point, the temporalis fascia was exposed.  The fascia layer was incised inferiorly.  The fascia was elevated and a rectangular graft was removed with scissors and placed in the fascia press, opening it under a heat lamp after a few minutes.   The middle ear was packed with Ciprodex -soaked gelfoam.  The tympanomeatal flap was laid down over the fascia graft that was laid from the middle ear, over the facial ridge, to the mastoid cavity.  Pieces of Ciprodex -soaked gelfoam were then placed over the flap and coating the mastoid.   The meatoplasty was then performed by injecting local anesthetic in the conchal bowl.  An 11 blade scalpel was used to penetrate the bowl superiorly and incising the canal fully in a radial fashion.  Conchal cartilage was dissected superior and inferiorly and removed.  The skin edges were then  sutured to deep structures using 3-0 Vicryl to open the canal.   The ear was then laid back in position.  The subcutaneous layer was closed with 3-0 and 4-0 Vicryl in a simple, interrupted fashion.  The skin was closed with 5-0 plain gut in a simple, running fashion.  The ear  canal was suctioned and pieces of Merocel coated in Bacitracin  ointment were added and saturated with saline.  Drapes were removed and the patient was cleaned off.  A standard mastoid dressing was added.  The patient was then returned to Anesthesia for wake-up, was extubated, and moved to the recovery room in stable condition.

## 2024-10-16 NOTE — Anesthesia Procedure Notes (Signed)
 Procedure Name: Intubation Date/Time: 10/16/2024 11:27 AM  Performed by: Arvell Edsel HERO, CRNAPre-anesthesia Checklist: Patient identified, Emergency Drugs available, Suction available, Patient being monitored and Timeout performed Patient Re-evaluated:Patient Re-evaluated prior to induction Oxygen Delivery Method: Circle system utilized Preoxygenation: Pre-oxygenation with 100% oxygen Induction Type: IV induction Ventilation: Oral airway inserted - appropriate to patient size and Two handed mask ventilation required Laryngoscope Size: Glidescope and 4 Grade View: Grade I Tube size: 7.0 mm Number of attempts: 1 Airway Equipment and Method: Patient positioned with wedge pillow and Video-laryngoscopy Placement Confirmation: ETT inserted through vocal cords under direct vision, positive ETCO2 and breath sounds checked- equal and bilateral Secured at: 23 cm Tube secured with: Tape Dental Injury: Teeth and Oropharynx as per pre-operative assessment

## 2024-10-16 NOTE — Transfer of Care (Signed)
 Immediate Anesthesia Transfer of Care Note  Patient: [name] [name]  Procedure(s) Performed: RIGHT OPEN CAVITY TYMPANOMASTOIDECTOMY (Right: Ear)  Patient Location: PACU  Anesthesia Type:General  Level of Consciousness: drowsy and patient cooperative  Airway & Oxygen Therapy: Patient Spontanous Breathing and Patient connected to face mask oxygen  Post-op Assessment: Report given to RN and Post -op Vital signs reviewed and stable  Post vital signs: Reviewed and stable  Last Vitals:  Vitals Value Taken Time  BP 159/77 10/16/24 15:45  Temp 36.8 C 10/16/24 15:43  Pulse 80 10/16/24 15:48  Resp 16 10/16/24 15:48  SpO2 93 % 10/16/24 15:48  Vitals shown include unfiled device data.  Last Pain:  Vitals:   10/16/24 1545  PainSc: Asleep      Patients Stated Pain Goal: 5 (10/16/24 0916)  Complications: No notable events documented.

## 2024-10-17 ENCOUNTER — Encounter (HOSPITAL_COMMUNITY): Payer: Self-pay | Admitting: Otolaryngology

## 2024-10-17 LAB — GLUCOSE, CAPILLARY
Glucose-Capillary: 186 mg/dL — ABNORMAL HIGH (ref 70–99)
Glucose-Capillary: 268 mg/dL — ABNORMAL HIGH (ref 70–99)

## 2024-10-17 MED ORDER — BACITRACIN ZINC 500 UNIT/GM EX OINT
1.0000 | TOPICAL_OINTMENT | Freq: Two times a day (BID) | CUTANEOUS | Status: DC
  Filled 2024-10-17: qty 28.4

## 2024-10-17 MED ORDER — HYDROCODONE-ACETAMINOPHEN 5-325 MG PO TABS: 1.0000 | ORAL_TABLET | Freq: Four times a day (QID) | ORAL | 0 refills | Status: AC | PRN

## 2024-10-17 NOTE — Discharge Instructions (Signed)
 SABRA

## 2024-10-17 NOTE — TOC Transition Note (Signed)
 Transition of Care [facility]) - Discharge Note   Patient Details  Name: [name] [name] MRN: [id number] Date of Birth: 03-06-1961  Transition of Care [facility] CM/SW Contact:  Tom-Johnson, Olanrewaju Osborn Daphne, RN Phone Number: 10/17/2024, 9:28 AM   Clinical Narrative:     Patient is scheduled for discharge today.  Outpatient f/u, hospital f/u and discharge instructions on AVS. No ICM needs or recommendations noted. Cab voucher will be given at the d/c lounge to transport at discharge.  No further ICM needs noted.       Final next level of care: Home/Self Care Barriers to Discharge: Barriers Resolved   Patient Goals and CMS Choice Patient states their goals for this hospitalization and ongoing recovery are:: To return home CMS Medicare.gov Compare Post Acute Care list provided to:: Patient Choice offered to / list presented to : NA      Discharge Placement                Patient to be transferred to facility by: Cab voucher at the d/c lounge      Discharge Plan and Services Additional resources added to the After Visit Summary for                  DME Arranged: N/A DME Agency: NA       HH Arranged: NA HH Agency: NA        Social Drivers of Health (SDOH) Interventions SDOH Screenings   Food Insecurity: No Food Insecurity (10/16/2024)  Recent Concern: Food Insecurity - High Risk (08/22/2024)   Received from [facility]  Housing: Low Risk (10/16/2024)  Transportation Needs: No Transportation Needs (10/16/2024)  Utilities: Not At Risk (10/16/2024)  Depression (PHQ2-9): Low Risk (08/15/2024)  Tobacco Use: Medium Risk (10/16/2024)  Health Literacy: Inadequate Health Literacy (08/11/2023)   [facility]     Readmission Risk Interventions    09/22/2023   11:25 AM  Readmission Risk Prevention Plan  Post Dischage Appt Complete  Medication Screening Complete  Transportation Screening Complete

## 2024-10-17 NOTE — Discharge Summary (Signed)
 Physician Discharge Summary  Patient ID: [name] [name] MRN: [id number] DOB/AGE: [birth year] 64 y.o.  Admit date: 10/16/2024 Discharge date: 10/17/2024  Admission Diagnoses: Chronic mastoiditis  Discharge Diagnoses:  Principal Problem:   Chronic mastoiditis   Discharged Condition: good  Hospital Course: 64 year old male with chronic mastoiditis with otorrhea who presented for right ear surgery.  See operative note.  He was observed overnight and did well.  His dressing was removed on POD 1 and he was felt to be stable for discharge home.  Consults: None  Significant Diagnostic Studies: None  Treatments: surgery: Right open cavity tympanomastoidectomy  Discharge Exam: Blood pressure 125/66, pulse 70, temperature (!) 97.4 F (36.3 C), resp. rate 20, height 5' 7 (1.702 m), weight 122.5 kg, SpO2 94%. General appearance: alert, cooperative, and no distress Ears: Dressing removed, incision clean and intact, ear packs in place  Disposition: Discharge disposition: 01-Home or Self Care       Discharge Instructions     Discharge instructions   Complete by: As directed    Apply antibiotic ointment or Vaseline to ear packs and incision behind ear twice daily.  Resume home medicines.  Restart Effient  on 2/6.   Discharge wound care:   Complete by: As directed    Antibiotic ointment or Vaseline to ear packs and incision twice daily.   Increase activity slowly   Complete by: As directed       Allergies as of 10/17/2024       Reactions   Shellfish Allergy Nausea And Vomiting   OYSTERS   Yellow Dye #6 (sunset Yellow) Other (See Comments)   Gi intolerance         Medication List     TAKE these medications    aspirin  81 MG chewable tablet Chew 1 tablet (81 mg total) by mouth daily.   atorvastatin  80 MG tablet Commonly known as: LIPITOR  TAKE 1 TABLET BY MOUTH DAILY What changed: when to take this   carvedilol  3.125 MG tablet Commonly known as: COREG  TAKE 1 TABLET BY  MOUTH TWICE A DAY WITH A MEAL   citalopram  20 MG tablet Commonly known as: CELEXA  Take 1 tablet (20 mg total) by mouth daily.   Desitin 13 % Crea Generic drug: Zinc  Oxide Apply 1 Application topically daily as needed.   Dulera  200-5 MCG/ACT Aero Generic drug: mometasone -formoterol  INHALE 2 PUFFS INTO THE LUNGS EVERY MORNING AND AT BEDTIME   ferrous sulfate  325 (65 FE) MG tablet Take 1 tablet (325 mg total) by mouth every other day.   FreeStyle Libre 3 Plus Sensor Misc Change sensor every 15 days.   gentamicin  cream 0.1 % Commonly known as: GARAMYCIN  Apply 1 Application topically daily as needed.   HYDROcodone -acetaminophen  5-325 MG tablet Commonly known as: NORCO/VICODIN Take 1-2 tablets by mouth every 6 (six) hours as needed for moderate pain (pain score 4-6) or severe pain (pain score 7-10).   hydrocortisone  cream 1 % Apply 1 Application topically as needed (skin irritation).   Insulin  Pen Needle 31G X 4 MM Misc 1 Container by Does not apply route as needed.   Jardiance  25 MG Tabs tablet Generic drug: empagliflozin  TAKE 1 TABLET BY MOUTH DAILY   ketoconazole  2 % cream Commonly known as: NIZORAL  Apply 1 Application topically daily. BETWEEN TOES for 1 week   Lantus  SoloStar 100 UNIT/ML Solostar Pen Generic drug: insulin  glargine Inject 20 Units into the skin daily. What changed: how much to take   lisinopril  2.5 MG tablet Commonly known as:  ZESTRIL  Take 2.5 mg by mouth daily.   melatonin 5 MG Tabs Take 10 mg by mouth at bedtime.   metFORMIN  1000 MG tablet Commonly known as: GLUCOPHAGE  TAKE 1 TABLET BY MOUTH TWICE A DAY WITH A MEAL   multivitamin tablet Take 1 tablet by mouth daily. Centrum Men 50+   mupirocin  ointment 2 % Commonly known as: BACTROBAN  Apply 1 Application topically daily as needed.   nitroGLYCERIN  0.4 MG SL tablet Commonly known as: NITROSTAT  DISSOLVE 1 TABLET UNDER THE TONGUE FOR CHEST PAIN. IF PAIN REMAINS AFTER 5 MINUTES, CALL 911  AND REPEAT DOSE. MAX 3 TABLETS IN 15 MINUTES   nystatin  cream Commonly known as: MYCOSTATIN  Apply 1 Application topically 2 (two) times daily as needed for dry skin.   omeprazole  20 MG capsule Commonly known as: PRILOSEC Take 1 capsule (20 mg total) by mouth 2 (two) times daily before a meal.   Ozempic  (2 MG/DOSE) 8 MG/3ML Sopn Generic drug: Semaglutide  (2 MG/DOSE) Inject 2 mg into the skin once a week. Monday   prasugrel  10 MG Tabs tablet Commonly known as: EFFIENT  TAKE 1 TABLET BY MOUTH DAILY   Pulse Oximeter Misc Please give finger oximeter   tiotropium 18 MCG inhalation capsule Commonly known as: Spiriva  HandiHaler Place 1 capsule (18 mcg total) into inhaler and inhale daily.   triamcinolone  ointment 0.1 % Commonly known as: KENALOG  Apply 1 Application topically 2 (two) times daily. What changed:  when to take this reasons to take this   Ventolin  HFA 108 (90 Base) MCG/ACT inhaler Generic drug: albuterol  INHALE 2 PUFFS INTO THE LUNGS EVERY 6 HOURS AS NEEDED FOR WHEEZING OR FOR SHORTNESS OF BREATH   Voltaren Arthritis Pain 1 % Gel Generic drug: diclofenac Sodium Apply 1 Application topically daily as needed (pain).               Discharge Care Instructions  (From admission, onward)           Start     Ordered   10/17/24 0000  Discharge wound care:       Comments: Antibiotic ointment or Vaseline to ear packs and incision twice daily.   10/17/24 9251            Follow-up Information     Carlie Clark, MD. Schedule an appointment as soon as possible for a visit on 10/21/2024.   Specialty: Otolaryngology Contact information: [address] [place] [address] [phone]                 Signed: Clark Carlie 10/17/2024, 7:51 AM

## 2024-10-17 NOTE — Progress Notes (Signed)
 DISCHARGE NOTE HOME [name] [name] to be discharged Home per MD order. Discussed prescriptions and follow up appointments with the patient. Prescriptions given to patient; medication list explained in detail. Patient verbalized understanding.  Skin clean, dry and intact without evidence of skin break down, no evidence of skin tears noted. IV catheter discontinued intact. Site without signs and symptoms of complications. Dressing and pressure applied. Pt denies pain at the site currently. No complaints noted.  See LDA for surgical incision right ear Patient free of lines, drains, and wounds.   An After Visit Summary (AVS) was printed and given to the patient. Patient escorted via wheelchair, and discharged home via private auto.  Peyton SHAUNNA Pepper, RN

## 2024-10-17 NOTE — Anesthesia Postprocedure Evaluation (Signed)
"   Anesthesia Post Note  Patient: [name] [name]  Procedure(s) Performed: RIGHT OPEN CAVITY TYMPANOMASTOIDECTOMY (Right: Ear)     Patient location during evaluation: PACU Anesthesia Type: General Level of consciousness: awake and alert Pain management: pain level controlled Vital Signs Assessment: post-procedure vital signs reviewed and stable Respiratory status: spontaneous breathing, nonlabored ventilation, respiratory function stable and patient connected to nasal cannula oxygen Cardiovascular status: blood pressure returned to baseline and stable Postop Assessment: no apparent nausea or vomiting Anesthetic complications: no   No notable events documented.  Last Vitals:  Vitals:   10/17/24 0524 10/17/24 0734  BP: 116/72 125/66  Pulse: 67 70  Resp: 20 20  Temp: 36.6 C (!) 36.3 C  SpO2: 94% 94%    Last Pain:  Vitals:   10/17/24 0859  TempSrc:   PainSc: 5                  [name]      "

## 2024-10-18 ENCOUNTER — Other Ambulatory Visit: Payer: Self-pay

## 2024-10-18 DIAGNOSIS — E1110 Type 2 diabetes mellitus with ketoacidosis without coma: Secondary | ICD-10-CM

## 2024-10-18 MED ORDER — OZEMPIC (2 MG/DOSE) 8 MG/3ML ~~LOC~~ SOPN: 2.0000 mg | PEN_INJECTOR | SUBCUTANEOUS | 2 refills | Status: AC

## 2024-10-18 NOTE — Progress Notes (Unsigned)
" ° ° °  SUBJECTIVE:   CHIEF COMPLAINT: *** HPI:   [name] [name] is a 64 y.o.  with history notable for type 2 DM, iron deficiency anemia (awaiting EGD), CAD s/p stent, and recent mastoidectomy   Discussed the use of AI scribe software for clinical note transcription with the patient, who gave verbal consent to proceed.  History of Present Illness      PERTINENT  PMH / PSH/Family/Social History : reviewed recent procedure with Dr. Carlie, has upcomign EGD  OBJECTIVE:   There were no vitals taken for this visit.  Today's weight:  Review of prior weights: There were no vitals filed for this visit.   Cardiac: Regular rate and rhythm. Normal S1/S2. No murmurs, rubs, or gallops appreciated. Lungs: Clear bilaterally to ascultation.  Abdomen: Normoactive bowel sounds. No tenderness to deep or light palpation. No rebound or guarding.  ***  Psych: Pleasant and appropriate    ASSESSMENT/PLAN:   Assessment & Plan Type 2 diabetes mellitus with complication, with long-term current use of insulin  (HCC)     Suzann Daring, MD  Family Medicine Teaching Service  [facility]   "

## 2024-10-18 NOTE — Telephone Encounter (Signed)
 VM left for patient with recommendations for r/s his procedure out 6 weeks post op unless he gets cleared by his surgeon to proceed sooner. PV and colonoscopy will be cancelled at this time. Number provided to call and r/s for anytime after the 2nd week in march,

## 2024-10-21 ENCOUNTER — Ambulatory Visit: Admitting: Family Medicine

## 2024-10-21 DIAGNOSIS — E118 Type 2 diabetes mellitus with unspecified complications: Secondary | ICD-10-CM

## 2024-10-24 ENCOUNTER — Encounter

## 2024-10-28 ENCOUNTER — Ambulatory Visit: Admitting: Pharmacist

## 2024-10-31 ENCOUNTER — Encounter: Admitting: Gastroenterology

## 2024-11-05 ENCOUNTER — Ambulatory Visit: Admitting: Cardiology

## 2024-12-25 ENCOUNTER — Ambulatory Visit: Admitting: Podiatry
